# Patient Record
Sex: Male | Born: 1975 | Race: White | Hispanic: No | Marital: Married | Smoking: Former smoker
Health system: Southern US, Community
[De-identification: ages and names within clinical notes are randomized; demographics above are authoritative.]

## PROBLEM LIST (undated history)

## (undated) DIAGNOSIS — K219 Gastro-esophageal reflux disease without esophagitis: Secondary | ICD-10-CM

## (undated) DIAGNOSIS — Z8711 Personal history of peptic ulcer disease: Secondary | ICD-10-CM

## (undated) DIAGNOSIS — Z8719 Personal history of other diseases of the digestive system: Secondary | ICD-10-CM

## (undated) DIAGNOSIS — I10 Essential (primary) hypertension: Secondary | ICD-10-CM

## (undated) HISTORY — PX: APPENDECTOMY: SHX54

## (undated) HISTORY — DX: Personal history of peptic ulcer disease: Z87.11

## (undated) HISTORY — DX: Personal history of other diseases of the digestive system: Z87.19

## (undated) SURGERY — REMOVAL, HARDWARE
Anesthesia: Choice | Laterality: Right

---

## 2001-11-28 ENCOUNTER — Emergency Department (HOSPITAL_COMMUNITY): Admission: EM | Admit: 2001-11-28 | Discharge: 2001-11-28 | Payer: Self-pay | Admitting: *Deleted

## 2001-12-03 ENCOUNTER — Emergency Department (HOSPITAL_COMMUNITY): Admission: EM | Admit: 2001-12-03 | Discharge: 2001-12-03 | Payer: Self-pay | Admitting: Emergency Medicine

## 2012-01-21 ENCOUNTER — Emergency Department (HOSPITAL_COMMUNITY)
Admission: EM | Admit: 2012-01-21 | Discharge: 2012-01-21 | Disposition: A | Discharge status: Home / Self Care | Payer: Worker's Compensation | Attending: Emergency Medicine | Admitting: Emergency Medicine

## 2012-01-21 ENCOUNTER — Emergency Department (HOSPITAL_COMMUNITY): Payer: Worker's Compensation

## 2012-01-21 ENCOUNTER — Encounter (HOSPITAL_COMMUNITY): Payer: Self-pay | Admitting: *Deleted

## 2012-01-21 DIAGNOSIS — S335XXA Sprain of ligaments of lumbar spine, initial encounter: Secondary | ICD-10-CM | POA: Insufficient documentation

## 2012-01-21 DIAGNOSIS — M545 Low back pain, unspecified: Secondary | ICD-10-CM | POA: Insufficient documentation

## 2012-01-21 DIAGNOSIS — Y990 Civilian activity done for income or pay: Secondary | ICD-10-CM | POA: Insufficient documentation

## 2012-01-21 DIAGNOSIS — S39012A Strain of muscle, fascia and tendon of lower back, initial encounter: Secondary | ICD-10-CM

## 2012-01-21 DIAGNOSIS — Y9289 Other specified places as the place of occurrence of the external cause: Secondary | ICD-10-CM | POA: Insufficient documentation

## 2012-01-21 DIAGNOSIS — F17200 Nicotine dependence, unspecified, uncomplicated: Secondary | ICD-10-CM | POA: Insufficient documentation

## 2012-01-21 DIAGNOSIS — X500XXA Overexertion from strenuous movement or load, initial encounter: Secondary | ICD-10-CM | POA: Insufficient documentation

## 2012-01-21 MED ORDER — HYDROCODONE-ACETAMINOPHEN 5-325 MG PO TABS
1.0000 | ORAL_TABLET | Freq: Once | ORAL | Status: AC
  Administered 2012-01-21: 1 via ORAL
  Filled 2012-01-21: qty 1

## 2012-01-21 MED ORDER — NAPROXEN 500 MG PO TABS: 500.0000 mg | ORAL_TABLET | Freq: Two times a day (BID) | ORAL | Status: AC

## 2012-01-21 MED ORDER — CYCLOBENZAPRINE HCL 10 MG PO TABS: 10.0000 mg | ORAL_TABLET | Freq: Two times a day (BID) | ORAL | Status: AC | PRN

## 2012-01-21 MED ORDER — HYDROCODONE-ACETAMINOPHEN 5-325 MG PO TABS: 1.0000 | ORAL_TABLET | Freq: Four times a day (QID) | ORAL | Status: AC | PRN

## 2012-01-21 NOTE — ED Notes (Signed)
Patient with no complaints at this time. Respirations even and unlabored. Skin warm/dry. Discharge instructions reviewed with patient at this time. Patient given opportunity to voice concerns/ask questions. Patient discharged at this time and left Emergency Department with steady gait.   

## 2012-01-21 NOTE — ED Notes (Signed)
Patient transported to X-ray 

## 2012-01-21 NOTE — ED Provider Notes (Signed)
History     CSN: [id number]  Arrival date & time 01/21/12  1604   First MD Initiated Contact with Patient 01/21/12 1617      Chief Complaint  Patient presents with  . Back Pain    (Consider location/radiation/quality/duration/timing/severity/associated sxs/prior treatment) The history is provided by the patient.   patient is a 36 year old male with injury to his low part of his back approximately one week ago while lifting your a pop since that time he said pain on left side of the back it is nonradiating does not move into the left lower extremity she moves up the back some no midline tenderness. No numbness or weakness in the lower trimming is no incontinence. No past history of any back problems. Currently does not have a primary care Dr.  History reviewed. No pertinent past medical history.  Past Surgical History  Procedure Date  . Appendectomy     Family History  Problem Relation Age of Onset  . Diabetes Mother   . Heart failure Mother     History  Substance Use Topics  . Smoking status: Current Everyday Smoker  . Smokeless tobacco: Not on file  . Alcohol Use: No      Review of Systems  Constitutional: Negative for fever and chills.  HENT: Negative for neck pain.   Eyes: Negative for visual disturbance.  Respiratory: Negative for shortness of breath.   Cardiovascular: Negative for chest pain.  Gastrointestinal: Negative for nausea, vomiting and abdominal pain.  Genitourinary: Negative for dysuria.  Musculoskeletal: Positive for back pain.  Skin: Negative for rash.  Neurological: Negative for weakness, numbness and headaches.  Hematological: Does not bruise/bleed easily.    Allergies  Review of patient's allergies indicates no known allergies.  Home Medications   Current Outpatient Rx  Name Route Sig Dispense Refill  . ASPIRIN-ACETAMINOPHEN-CAFFEINE 250-250-65 MG PO TABS Oral Take 2 tablets by mouth as needed. FOR PAIN    . CYCLOBENZAPRINE HCL 10 MG  PO TABS Oral Take 1 tablet (10 mg total) by mouth 2 (two) times daily as needed for muscle spasms. 20 tablet 0  . HYDROCODONE-ACETAMINOPHEN 5-325 MG PO TABS Oral Take 1-2 tablets by mouth every 6 (six) hours as needed for pain. 14 tablet 0  . NAPROXEN 500 MG PO TABS Oral Take 1 tablet (500 mg total) by mouth 2 (two) times daily. 14 tablet 0    BP 132/89  Pulse 84  Temp(Src) 97.9 F (36.6 C) (Oral)  Resp 18  Ht 5\' 11"  (1.803 m)  Wt 225 lb (102.059 kg)  BMI 31.38 kg/m2  SpO2 100%  Physical Exam  Nursing note and vitals reviewed. Constitutional: He is oriented to person, place, and time. He appears well-developed and well-nourished. No distress.  HENT:  Head: Normocephalic and atraumatic.  Mouth/Throat: Oropharynx is clear and moist.  Eyes: Conjunctivae and EOM are normal. Pupils are equal, round, and reactive to light.  Neck: Normal range of motion. Neck supple.  Cardiovascular: Normal rate, regular rhythm and normal heart sounds.   Pulmonary/Chest: Effort normal and breath sounds normal.  Abdominal: Soft. Bowel sounds are normal. There is no tenderness.  Musculoskeletal: Normal range of motion. He exhibits tenderness. He exhibits no edema.       Nontender along the lumbar spine however there is left paraspinous muscle tenderness. Neurocirculatory is intact to the lower extremities.  Neurological: He is alert and oriented to person, place, and time. No cranial nerve deficit. Coordination normal.  Skin: Skin is warm. No  rash noted. No erythema.    ED Course  Procedures (including critical care time)  Labs Reviewed - No data to display Dg Lumbar Spine Complete  01/21/2012  *RADIOLOGY REPORT*  Clinical Data: Back pain for 1 week  LUMBAR SPINE - COMPLETE 4+ VIEW  Comparison: None.  Findings:  There are five non-rib bearing lumbar vertebral bodies with possible lumbarization of the S1 vertebral body.  For the purpose of this dictation, lumbar spine levels will be labeled L1-L5.  There  is mild scoliotic curvature of the visualized thoracolumbar spine.  No anterolisthesis or retrolisthesis.  No pars defects.  There is mild (<25%) age indeterminate anterior compression deformity of the L1 and L2 vertebral bodies.  Mild DDD at L3 - L4. Intervertebral disc spaces are otherwise well preserved. Limited visualization of the bilateral SI joints is normal.  Regional bowel gas pattern is normal.  IMPRESSION: 1.  Mild (<25%) age indeterminate anterior compression deformity of the L1 and L2 vertebral bodies. Correlation for point tenderness at these locations recommended. 2.  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  Original Report Authenticated By: Waynard Reeds, M.D.     1. Lumbar strain       MDM   X-ray findings of the lumbar area noted on examination there is no point tenderness to the lumbar spine is bodies. Most of the discomfort is to the left in the paraspinous muscle area. However with the lifting at work there was a popping so there could be a correlation to the compression deformities. How will treat patient with also relaxers pain medicine anti-inflammatories and referral to spine clinic. Patient without any neuro deficits or evidence of nerve compression at this point in time.        Shelda Jakes, MD 01/21/12 [phone]

## 2012-01-21 NOTE — ED Notes (Signed)
Hurt low back last week at work when lifting , heard a pop

## 2012-01-21 NOTE — Discharge Instructions (Signed)
Back Pain, Adult Low back pain is very common. About 1 in 5 people have back pain.The cause of low back pain is rarely dangerous. The pain often gets better over time.About half of people with a sudden onset of back pain feel better in just 2 weeks. About 8 in 10 people feel better by 6 weeks.  CAUSES Some common causes of back pain include:  Strain of the muscles or ligaments supporting the spine.   Wear and tear (degeneration) of the spinal discs.   Arthritis.   Direct injury to the back.  DIAGNOSIS Most of the time, the direct cause of low back pain is not known.However, back pain can be treated effectively even when the exact cause of the pain is unknown.Answering your caregiver's questions about your overall health and symptoms is one of the most accurate ways to make sure the cause of your pain is not dangerous. If your caregiver needs more information, he or she [place] order lab work or imaging tests (X-rays or MRIs).However, even if imaging tests show changes in your back, this usually does not require surgery. HOME CARE INSTRUCTIONS For many people, back pain returns.Since low back pain is rarely dangerous, it is often a condition that people can learn to manageon their own.   Remain active. It is stressful on the back to sit or stand in one place. Do not sit, drive, or stand in one place for more than 30 minutes at a time. Take short walks on level surfaces as soon as pain allows.Try to increase the length of time you walk each day.   Do not stay in bed.Resting more than 1 or 2 days can delay your recovery.   Do not avoid exercise or work.Your body is made to move.It is not dangerous to be active, even though your back [place] hurt.Your back will likely heal faster if you return to being active before your pain is gone.   Pay attention to your body when you bend and lift. Many people have less discomfortwhen lifting if they bend their knees, keep the load close to their  bodies,and avoid twisting. Often, the most comfortable positions are those that put less stress on your recovering back.   Find a comfortable position to sleep. Use a firm mattress and lie on your side with your knees slightly bent. If you lie on your back, put a pillow under your knees.   Only take over-the-counter or prescription medicines as directed by your caregiver. Over-the-counter medicines to reduce pain and inflammation are often the most helpful.Your caregiver Sanker prescribe muscle relaxant drugs.These medicines help dull your pain so you can more quickly return to your normal activities and healthy exercise.   Put ice on the injured area.   Put ice in a plastic bag.   Place a towel between your skin and the bag.   Leave the ice on for 15 to 20 minutes, 3 to 4 times a day for the first 2 to 3 days. After that, ice and heat Mcmeen be alternated to reduce pain and spasms.   Ask your caregiver about trying back exercises and gentle massage. This Pull[place] be of some benefit.   Avoid feeling anxious or stressed.Stress increases muscle tension and can worsen back pain.It is important to recognize when you are anxious or stressed and learn ways to manage it.Exercise is a great option.  SEEK MEDICAL CARE IF:  You have pain that is not relieved with rest or medicine.   You have   pain that does not improve in 1 week.   You have new symptoms.   You are generally not feeling well.  SEEK IMMEDIATE MEDICAL CARE IF:   You have pain that radiates from your back into your legs.   You develop new bowel or bladder control problems.   You have unusual weakness or numbness in your arms or legs.   You develop nausea or vomiting.   You develop abdominal pain.   You feel faint.  Document Released: 08/12/2005 Document Revised: 08/01/2011 Document Reviewed: 12/31/2010 [facility] Patient Information 2012 [place], [place].  Call spine clinic for followup. X-rays do show some compression fracture in  the lower lumbar and some degenerative changes that Sw[place] be contributing to the back pain. These would be old findings. They do not appear to be acute. Take medicine as directed return for new or worse symptoms. Also resource guide provided below to help you find a primary care Dr.  Sheila Oats GUIDE  Dental Problems  Patients with Medicaid: [facility] [phone]                                           [phone] [address] Phone:  [phone]                                                  Phone:  [phone]  If unable to pay or uninsured, contact:  [facility] or [facility]. to become qualified for the adult dental clinic.  Chronic Pain Problems Contact [facility]  [phone] Patients need to be referred by their primary care doctor.  Insufficient Money for Medicine Contact [employer] Way:  call "211" or [facility] 231-233-8781.  No Primary Care Doctor Call [facility]  501-726-7452 Other agencies that provide inexpensive medical care    [facility]  [phone]    [facility]  [phone]    [facility]  [phone]    [facility]  [phone]    [facility]  [phone]    [facility]  [phone]  Psychological Services [facility]  [phone] [facility]  [phone] [facility]   [phone] (emergency services [phone])  Substance Abuse Resources [facility]  (226)524-4713 [facility] 628-284-7992 [facility] (415)[phone] Floydene Flock [phone] Residential & Outpatient Substance Abuse Program  ([phone] [phone]  Abuse/Neglect [facility] Child Abuse Hotline (352)085-4544 [facility] Child Abuse Hotline 978-489-3619 (After Hours)  Emergency Shelter [facility] 503-171-9895  Maternity [facility] [phone] [facility] Services [phone]  MRSA Hotline #:   4062531204    [facility] Resources  [facility]     [employer]                          [facility] Dept. [address] [place] Phone:  [phone]                                   Phone:  [phone]                 Phone:  [place] Phone:  [place] [phone] (After Hours)

## 2012-05-30 ENCOUNTER — Encounter (HOSPITAL_COMMUNITY): Payer: Self-pay

## 2012-05-30 ENCOUNTER — Emergency Department (HOSPITAL_COMMUNITY)
Admission: EM | Admit: 2012-05-30 | Discharge: 2012-05-31 | Disposition: A | Discharge status: Home / Self Care | Payer: Self-pay | Attending: Emergency Medicine | Admitting: Emergency Medicine

## 2012-05-30 DIAGNOSIS — T63391A Toxic effect of venom of other spider, accidental (unintentional), initial encounter: Secondary | ICD-10-CM | POA: Insufficient documentation

## 2012-05-30 DIAGNOSIS — T6391XA Toxic effect of contact with unspecified venomous animal, accidental (unintentional), initial encounter: Secondary | ICD-10-CM | POA: Insufficient documentation

## 2012-05-30 DIAGNOSIS — F17200 Nicotine dependence, unspecified, uncomplicated: Secondary | ICD-10-CM | POA: Insufficient documentation

## 2012-05-30 DIAGNOSIS — T63311A Toxic effect of venom of black widow spider, accidental (unintentional), initial encounter: Secondary | ICD-10-CM

## 2012-05-30 MED ORDER — HYDROMORPHONE HCL PF 1 MG/ML IJ SOLN
1.0000 mg | Freq: Once | INTRAMUSCULAR | Status: AC
  Administered 2012-05-31: 1 mg via INTRAVENOUS
  Filled 2012-05-30: qty 1

## 2012-05-30 MED ORDER — ONDANSETRON HCL 4 MG/2ML IJ SOLN
4.0000 mg | Freq: Once | INTRAMUSCULAR | Status: AC
  Administered 2012-05-31: 4 mg via INTRAVENOUS
  Filled 2012-05-30: qty 2

## 2012-05-30 MED ORDER — KETOROLAC TROMETHAMINE 30 MG/ML IJ SOLN
30.0000 mg | Freq: Once | INTRAMUSCULAR | Status: AC
  Administered 2012-05-31: 30 mg via INTRAVENOUS
  Filled 2012-05-30: qty 1

## 2012-05-30 NOTE — ED Provider Notes (Signed)
History   This chart was scribed for Vida Roller, MD scribed by Magnus Sinning. The patient was seen in room APA12/APA12 at 23:48   CSN: [id number]  Arrival date & time 05/30/12  2321    Chief Complaint  Patient presents with  . Insect Bite  . black widow bite     (Consider location/radiation/quality/duration/timing/severity/associated sxs/prior treatment) HPI Victor [name] is a 36 y.o. male who presents to the Emergency Department for EVAL of an insect bite at left chest with associated localized pain, nausea and left arm numbness, and redness onset this evening. Patient was bitten by a black widow Spider that he states was inside his T-shirt on his left upper chest when he was bitten.  The pain was acute in onset, persistent, worse with palpation of the chest and associated with mild nausea. He denies any blurred vision or weakness. The symptoms are persistent. He has had no medication prior to arrival. He does bring with him the spider that bit him and I have confirmed that this is a black widow spider on my exam.  History reviewed. No pertinent past medical history.  Past Surgical History  Procedure Date  . Appendectomy     Family History  Problem Relation Age of Onset  . Diabetes Mother   . Heart failure Mother     History  Substance Use Topics  . Smoking status: Current Every Day Smoker  . Smokeless tobacco: Not on file  . Alcohol Use: No      Review of Systems  Constitutional: Negative for fever.  Cardiovascular: Positive for chest pain.  Gastrointestinal: Positive for nausea.  Musculoskeletal: Negative for myalgias and back pain.  Skin: Positive for rash.  Neurological: Positive for light-headedness. Negative for headaches.    Allergies  Review of patient's allergies indicates no known allergies.  Home Medications   Current Outpatient Rx  Name Route Sig Dispense Refill  . ASPIRIN-ACETAMINOPHEN-CAFFEINE 250-250-65 MG PO TABS Oral Take 2 tablets by mouth  as needed. FOR PAIN    . CEPHALEXIN 500 MG PO CAPS Oral Take 1 capsule (500 mg total) by mouth 4 (four) times daily. 40 capsule 0  . NAPROXEN 500 MG PO TABS Oral Take 1 tablet (500 mg total) by mouth 2 (two) times daily. 14 tablet 0  . NAPROXEN 500 MG PO TABS Oral Take 1 tablet (500 mg total) by mouth 2 (two) times daily with a meal. 30 tablet 0  . OXYCODONE-ACETAMINOPHEN 5-325 MG PO TABS Oral Take 1 tablet by mouth every 4 (four) hours as needed for pain. 20 tablet 0    BP 127/83  Pulse 81  Resp 17  SpO2 99%  Physical Exam  Nursing note and vitals reviewed. Constitutional: He appears well-developed and well-nourished. No distress.  HENT:  Head: Normocephalic and atraumatic.  Mouth/Throat: Oropharynx is clear and moist.  Eyes: Conjunctivae normal and EOM are normal.  Neck: Normal range of motion. Neck supple. No tracheal deviation present.  Cardiovascular: Normal rate and regular rhythm.   Pulmonary/Chest: Effort normal. No respiratory distress. He exhibits tenderness ( tenderness to palpation over the mild erythema of the left upper chest wall at the site of the insect bite).  Abdominal: He exhibits no distension.  Musculoskeletal: Normal range of motion.  Neurological: He is alert.       Speech is normal, gait is normal, moves all extremities without difficulty or defecit  Skin: Skin is dry. There is erythema.       8 cm  area of patchy, lacy erythema to left upper chest wall, no subcutaneous emphysema, no increased warmth  Psychiatric: He has a normal mood and affect. His behavior is normal.    ED Course  Procedures (including critical care time) DIAGNOSTIC STUDIES: Oxygen Saturation is 99% on room air, normal by my interpretation.    COORDINATION OF CARE:   Labs Reviewed - No data to display No results found.   1. Black widow spider bite       MDM  The patient has sustained an insect bite to his chest wall consistent with a venomous spider. He has had only local  symptoms, blood pressure was initially elevated at 152/88, hydromorphone and ketorolac intravenously were given as well as Zofran with almost complete resolution of symptoms and improvement of blood pressure to 120/80. He is not febrile, not tachycardic and appears stable for discharge. I have described to him the indications for return including worsening of the swelling or redness on his chest wall or fevers. He has been given a prescription for Keflex should he develop symptoms consistent with infection. He has expressed his understanding. He has a ride who can drive him home  The pt was given opiate type medications while in the emergency dept.  The patient was instructed on the possible side effects and potential allergic reactions associated with said medications and agreed to their use.  I also instructed the patient not to perform certain activities after use of these medications such as driving a vehicle and performing child care.  They were instructed not to ingest alcohol or other medications that Dillen cause excessive sleepiness, tranquilizers or CNS depressant medications.  They have expressed their understanding.  If the pt was given opiate medications for home by prescription they were reminded of these precautions as well.  I personally performed the services described in this documentation, which was scribed in my presence. The recorded information has been reviewed and considered.          Vida Roller, MD 05/31/12 [phone]

## 2012-05-30 NOTE — ED Notes (Signed)
Pt states he thinks he was bitten by a black widow, has bite mark to left anterior chest, is having body aches and pain into left arm.  Redness to over pectoral muscle.

## 2012-05-31 MED ORDER — OXYCODONE-ACETAMINOPHEN 5-325 MG PO TABS: 1.0000 | ORAL_TABLET | ORAL | Status: DC | PRN

## 2012-05-31 MED ORDER — NAPROXEN 500 MG PO TABS: 500.0000 mg | ORAL_TABLET | Freq: Two times a day (BID) | ORAL | Status: DC

## 2012-05-31 MED ORDER — CEPHALEXIN 500 MG PO CAPS: 500.0000 mg | ORAL_CAPSULE | Freq: Four times a day (QID) | ORAL | Status: DC

## 2014-03-02 ENCOUNTER — Emergency Department (HOSPITAL_BASED_OUTPATIENT_CLINIC_OR_DEPARTMENT_OTHER)
Admission: EM | Admit: 2014-03-02 | Discharge: 2014-03-02 | Disposition: A | Discharge status: Home / Self Care | Payer: Self-pay | Attending: Emergency Medicine | Admitting: Emergency Medicine

## 2014-03-02 ENCOUNTER — Emergency Department (HOSPITAL_BASED_OUTPATIENT_CLINIC_OR_DEPARTMENT_OTHER): Payer: Self-pay

## 2014-03-02 ENCOUNTER — Encounter (HOSPITAL_BASED_OUTPATIENT_CLINIC_OR_DEPARTMENT_OTHER): Payer: Self-pay | Admitting: Emergency Medicine

## 2014-03-02 DIAGNOSIS — F17200 Nicotine dependence, unspecified, uncomplicated: Secondary | ICD-10-CM | POA: Insufficient documentation

## 2014-03-02 DIAGNOSIS — Y9389 Activity, other specified: Secondary | ICD-10-CM | POA: Insufficient documentation

## 2014-03-02 DIAGNOSIS — S8990XA Unspecified injury of unspecified lower leg, initial encounter: Secondary | ICD-10-CM | POA: Insufficient documentation

## 2014-03-02 DIAGNOSIS — S99919A Unspecified injury of unspecified ankle, initial encounter: Principal | ICD-10-CM

## 2014-03-02 DIAGNOSIS — S8991XA Unspecified injury of right lower leg, initial encounter: Secondary | ICD-10-CM

## 2014-03-02 DIAGNOSIS — S99929A Unspecified injury of unspecified foot, initial encounter: Principal | ICD-10-CM

## 2014-03-02 DIAGNOSIS — Z791 Long term (current) use of non-steroidal anti-inflammatories (NSAID): Secondary | ICD-10-CM | POA: Insufficient documentation

## 2014-03-02 DIAGNOSIS — Z792 Long term (current) use of antibiotics: Secondary | ICD-10-CM | POA: Insufficient documentation

## 2014-03-02 DIAGNOSIS — Y9289 Other specified places as the place of occurrence of the external cause: Secondary | ICD-10-CM | POA: Insufficient documentation

## 2014-03-02 DIAGNOSIS — X500XXA Overexertion from strenuous movement or load, initial encounter: Secondary | ICD-10-CM | POA: Insufficient documentation

## 2014-03-02 MED ORDER — IBUPROFEN 800 MG PO TABS: 800.0000 mg | ORAL_TABLET | Freq: Three times a day (TID) | ORAL | Status: DC | PRN

## 2014-03-02 MED ORDER — HYDROCODONE-ACETAMINOPHEN 5-325 MG PO TABS: 1.0000 | ORAL_TABLET | ORAL | Status: DC | PRN

## 2014-03-02 NOTE — Discharge Instructions (Signed)
Possible Anterior Cruciate Ligament Tear with Rehab The anterior cruciate ligament (ACL) of the knee is one of the four major ligaments of the knee. The ACL is responsible for preventing the shinbone (tibia) from moving to far forward (anteriorly) in relation to the thigh bone (femur). An ACL tear (sprain) is a common injury for athletes. The ACL is most important for sports in which pivoting, changing cutting (direction), or jumping and landing is necessary. In general, ligaments do not heal well, and these injuries often require surgery. Approximately 50% of people who tear their ACL also tear the cartilage of their meniscus at the same time. SYMPTOMS   "Pop" or tear heard or felt at the time of injury.  An inability to continue playing after the injury.  Swelling of the knee within 48 hours.  Inability to straighten knee.  Knee giving way or buckling, particularly when trying to pivot, cut (rapidly change direction) or jump.  Occasionally, locking when there is concurrent injury to the meniscus cartilage. CAUSES  An ACL tear occurs when the ligament is subjected to a greater force than it can withstand. ACL tears commonly occur from contact (being tackled at the knee) or non-contact (ie. landing awkwardly) events. RISK INCREASES WITH:  Sports that require pivoting, jumping, cutting, or changing direction (ie. basketball, soccer, or volleyball) or contact sports. (football, rugby or lacrosse).  Poor strength and flexibility.  Women tend to be at a higher risk than men.  Improperly fitted or padded equipment. PREVENTION   Warm up and stretch properly before activity.  Maintain physical fitness:  Thigh, leg, and knee flexibility.  Muscle strength and endurance.  Cardiovascular fitness.  Learn and use proper technique.  Wear properly fitted equipment (appropriate length of cleats for surface). PROGNOSIS  ACL tears do not heal on their own. However, most people will be able  to perform activities of daily living after completing a rehabilitation program. For individuals who desire to return to activities that require pivoting, cutting, or jumping and landing, surgery is usually required. RELATED COMPLICATIONS   Frequent recurrence of symptoms, such as knee giving way, instability, and swelling.  Meniscus injury, which Alphin cause locking and swelling of the knee.  Injury to other structures of the knee.  Arthritis of the knee.  Knee stiffness (loss of knee motion). TREATMENT Treatment initially involves ice and pain medication to help reduce pain and inflammation. It is often recommended that one walk with crutches until your knee will allow walking without a limp. Rehabilitation programs that involve strengthening and stretching exercises to regain strength and a full range of motion are necessary to regain proper functioning of the knee. These exercises Mcmeans be completed at home or with a therapist. Your caregiver Rather give you a knee brace to help support the instable joint. Rehabilitation programs also will educate you on how to avoid further injury to the joint. If you want to return to sports involving pivoting cutting, or jumping and landing, then surgery is necessary to replace (reconstruct) the torn ligament. MEDICATION  If pain medication is necessary, then nonsteroidal anti-inflammatory medications, such as aspirin and ibuprofen, or other minor pain relievers, such as acetaminophen, are often recommended.  Do not take pain medication within 7 days before surgery.  Prescription pain relievers Hypolite be given if deemed necessary by your caregiver. Use only as directed and only as much as you need. HEAT AND COLD  Cold treatment (icing) relieves pain and reduces inflammation. Cold treatment should be applied for 10  to 15 minutes every 2 to 3 hours for inflammation and pain and immediately after any activity that aggravates your symptoms. Use ice packs or an ice  massage.  Heat treatment Faro be used prior to performing the stretching and strengthening activities prescribed by your caregiver, physical therapist or athletic trainer. Use a heat pack or a warm soak. SEEK MEDICAL CARE IF:   Symptoms get worse or do not improve in 6 weeks despite treatment.  New, unexplained symptoms develop. (Drugs used in treatment Berrett produce side effects). EXERCISES  RANGE OF MOTION (ROM) AND STRETCHING EXERCISES - Anterior Cruciate Ligament Tear These exercises Pillars help you when beginning to rehabilitate your injury. Your symptoms Gombert resolve with or without further involvement from your physician, physical therapist or athletic trainer. While completing these exercises, remember:  Restoring tissue flexibility helps normal motion to return to the joints. This allows healthier, less painful movement and activity.  An effective stretch should be held for at least 30 seconds.  A stretch should never be painful. You should only feel a gentle lengthening or release in the stretched tissue. RANGE OF MOTION - Knee Flexion, Active  Lie on your back with both knees straight. (If this causes back discomfort, bend your opposite knee, placing your foot flat on the floor.)  Slowly slide your heel back toward your buttocks until you feel a gentle stretch in the front of your knee or thigh.  Hold for __________ seconds. Slowly slide your heel back to the starting position. Repeat __________ times. Complete this exercise __________ times per day. STRETCH - Knee Flexion, Supine  Lie on the floor with your right / left heel/foot lightly touching the wall (place both feet on the wall if you do not use a door frame).  Without using any effort, allow gravity to slide your foot down the wall slowly until you feel a gentle stretch in the front of your right / left knee.  Hold this stretch for __________ seconds. Then return the leg to the starting position, using your healthy leg for  help, if needed. Repeat __________ times. Complete this stretch __________ times per day. RANGE OF MOTION - Knee Flexion and Extension, Active-Assisted  Sit on the edge of a table or chair with your thighs firmly supported. It Kamp be helpful to place a folded towel under the end of your right / left thigh.  Flexion (bending): Place the ankle of your healthy leg on top of the other ankle. Use your healthy leg to gently bend your right / left knee until you feel a mild tension across the top of your knee.  Hold for __________ seconds.  Extension (straightening): Switch your ankles so your right / left leg is on top. Use your healthy leg to straighten your right / left knee until you feel a mild tension on the backside of your knee.  Hold for __________ seconds. Repeat __________ times. Complete this exercise __________ times per day. STRETCH - Knee Extension Sitting  Sit with your right / left leg/heel propped on another chair, coffee table or foot stool.  Allow your leg muscles to relax, letting gravity straighten out your knee.*  You should feel a stretch behind your right / left knee. Hold this position for __________ seconds. Repeat __________ times. Complete this stretch __________ times per day. *Your physician, physical therapist or athletic trainer Wedig instruct you to place a __________ weight on your thigh just above your kneecap to deepen the stretch. STRETCH - Knee Extension,  Prone  Lie on your stomach on a firm surface, such as a bed or countertop. Place your right / left knee and leg just beyond the edge of the surface. You Denk wish to place a towel under the far end of your thigh for comfort.  Relax your leg muscles and allow gravity to straighten your knee. Your clinician Philbrick advise you to add an ankle weight if more resistance is helpful for you.  You should feel a stretch in the back of your right / left knee. Hold this position for __________ seconds. Repeat __________  times. Complete this __________ times per day. *Your physician, physical therapist or athletic trainer Kubisiak instruct you to place a __________ weight on your ankle to deepen the stretch. STRENGTHENING EXERCISES - Anterior Cruciate Ligament Tear These exercises Wooton help you when beginning to rehabilitate your injury. They Fredericksen resolve your symptoms with or without further involvement from your physician, physical therapist or athletic trainer. While completing these exercises, remember:  Muscles can gain both the endurance and the strength needed for everyday activities through controlled exercises.  Complete these exercises as instructed by your physician, physical therapist or athletic trainer. Progress the resistance and repetitions only as guided.  You Sidman experience muscle soreness or fatigue, but the pain or discomfort you are trying to eliminate should never worsen during these exercises. If this pain does worsen, stop and make certain you are following the directions exactly. If the pain is still present after adjustments, discontinue the exercise until you can discuss the trouble with your clinician. STRENGTH - Quadriceps, Isometrics  Lie on your back with your right / left leg extended and your opposite knee bent.  Gradually tense the muscles in the front of your thigh. You should see either your knee cap slide up toward your hip or increased dimpling just above the knee. This motion will push the back of the knee down toward the floor/mat/bed on which you are lying.  Hold the muscle as tight as you can without increasing your pain for __________ seconds.  Relax the muscles slowly and completely in between each repetition. Repeat __________ times. Complete this exercise __________ times per day. STRENGTH - Hamstring, Isometrics  Lie on your back on a firm surface.  Bend your right / left knee approximately __________ degrees.  Dig your heel into the surface as if you are trying to  pull it toward your buttocks. Tighten the muscles in the back of your thighs to "dig" as hard as you can without increasing any pain.  Hold this position for __________ seconds.  Release the tension gradually and allow your muscle to completely relax for __________ seconds in between each exercise. Repeat __________ times. Complete this exercise __________ times per day. STRENGTH - Quadriceps, Straight Leg Raises Quality counts! Watch for signs that the quadriceps muscle is working to insure you are strengthening the correct muscles and not "cheating" by substituting with healthier muscles.  Lay on your back with your right / left leg extended and your opposite knee bent.  Tense the muscles in the front of your right / left thigh. You should see either your knee cap slide up or increased dimpling just above the knee. Your thigh Papp even quiver.  Tighten these muscles even more and raise your leg 4 to 6 inches off the floor. Hold for __________ seconds.  Keeping these muscles tense, lower your leg.  Relax the muscles slowly and completely in between each repetition. Repeat __________ times. Complete  this exercise __________ times per day. STRENGTH - Hip Extensors, Bridge  Lie on your back on a firm surface. Bend your knees and place your feet flat on the floor.  Tighten your buttocks muscles and lift your bottom off the floor until your trunk is level with your thighs. You should feel the muscles in your buttocks and back of your thighs working. If you do not feel these muscles, slide your feet 1 to 2 inches further away from your buttocks.  Hold this position for __________ seconds.  Slowly lower your hips to the starting position and allow your buttock muscles relax completely before beginning the next repetition.  If this exercise is too easy, you Kinnard cross your arms over your chest. Repeat __________ times. Complete this exercise __________ times per day. STRENGTH - Hip Abductors,  Straight Leg Raises Be aware of your form throughout the entire exercise so that you exercise the correct muscles. Sloppy form means that you are not strengthening the correct muscles.  Lie on your side so that your head, shoulders, knee and hip line up. You Nevers bend your lower knee to help maintain your balance. Your right / left leg should be on top.  Roll your hips slightly forward, so that your hips are stacked directly over each other and your right / left knee is facing forward.  Lift your top leg up 4 to 6 inches, leading with your heel. Be sure that your foot does not drift forward or that your knee does not roll toward the ceiling.  Hold this position for __________ seconds. You should feel the muscles in your outer hip lifting (you Farewell not notice this until your leg begins to tire).  Slowly lower your leg to the starting position. Allow the muscles to fully relax before beginning the next repetition. Repeat __________ times. Complete this exercise __________ times per day. Document Released: 03/13/2005 Document Revised: 11/04/2011 Document Reviewed: 11/24/2008 [facility] Patient Information 2015 [place], [place]. This information is not intended to replace advice given to you by your health care provider. Make sure you discuss any questions you have with your health care provider.     Possible Patellar Dislocation A patellar dislocation occurs when your kneecap (patella) slips out of its normal position in a groove in front of the lower end of your thighbone (femur). This groove is called the patellofemoral groove.  CAUSES The kneecap is normally positioned over the front of the knee joint at the base of the thighbone. A kneecap can be dislocated when:  The kneecap is out of place (patellar tracking disorder), and force is applied.  The foot is firmly planted pointing outward, and the knee bends with the thigh turned inward. This kind of injury is common during many sports  activities.  The inner edge of the kneecap is hit, pushing it toward the outer side of the leg. SIGNS AND SYMPTOMS  Severe pain.  A misshapen knee that looks like a bone is out of position.  A popping sensation, followed by a feeling that something is out of place.  Inability to bend or straighten the knee.  Knee swelling.  Cool, pale skin or numbness and tingling in or below the affected knee. DIAGNOSIS  Your health care provider will physically examine the injured area. An X-ray exam Rumery be done to make sure a bone fracture has not occurred. In some cases, your health care provider Kruger look inside your knee joint with an instrument much like a pencil-sized telescope (  arthroscope). This Naab be done to make sure you have no loose cartilage in your joint. Loose cartilage is not visible on an X-ray image. TREATMENT  In many instances, the patella can be guided back into position without much difficulty. It often goes back into position by straightening the leg. Often, nothing more Lufkin be needed other than a brief period of immobilization followed by the exercises your health care provider recommends. If patellar dislocation starts to become frequent after the first incident, surgery Sand be needed to prevent your patella from slipping out of place. HOME CARE INSTRUCTIONS   Only take over-the-counter or prescription medicines for pain, discomfort, or fever as directed by your health care provider.  Use a knee brace if directed to do so by your health care provider.  Use crutches as instructed.  Apply ice to the injured knee:  Put ice in a plastic bag.  Place a towel between your skin and the bag.  Leave the ice on for 20 minutes, 2-3 times a day.  Follow your health care provider's instructions for doing any recommended range-of-motion exercises or other exercises. SEEK IMMEDIATE MEDICAL CARE IF:  You have increased pain or swelling in the knee that is not relieved with  medicine.  You have increasing inflammation in the knee.  You have locking or catching of your knee. MAKE SURE YOU:  Understand these instructions.  Will watch your condition.  Will get help right away if you are not doing well or get worse. Document Released: 05/07/2001 Document Revised: 06/02/2013 Document Reviewed: 03/24/2013 [facility] Patient Information 2015 [place], [place]. This information is not intended to replace advice given to you by your health care provider. Make sure you discuss any questions you have with your health care provider.   RICE: Routine Care for Injuries The routine care of many injuries includes Rest, Ice, Compression, and Elevation (RICE). HOME CARE INSTRUCTIONS  Rest is needed to allow your body to heal. Routine activities can usually be resumed when comfortable. Injured tendons and bones can take up to 6 weeks to heal. Tendons are the cord-like structures that attach muscle to bone.  Ice following an injury helps keep the swelling down and reduces pain.  Put ice in a plastic bag.  Place a towel between your skin and the bag.  Leave the ice on for 15-20 minutes, 3-4 times a day, or as directed by your health care provider. Do this while awake, for the first 24 to 48 hours. After that, continue as directed by your caregiver.  Compression helps keep swelling down. It also gives support and helps with discomfort. If an elastic bandage has been applied, it should be removed and reapplied every 3 to 4 hours. It should not be applied tightly, but firmly enough to keep swelling down. Watch fingers or toes for swelling, bluish discoloration, coldness, numbness, or excessive pain. If any of these problems occur, remove the bandage and reapply loosely. Contact your caregiver if these problems continue.  Elevation helps reduce swelling and decreases pain. With extremities, such as the arms, hands, legs, and feet, the injured area should be placed near or above the  level of the heart, if possible. SEEK IMMEDIATE MEDICAL CARE IF:  You have persistent pain and swelling.  You develop redness, numbness, or unexpected weakness.  Your symptoms are getting worse rather than improving after several days. These symptoms Kerby indicate that further evaluation or further X-rays are needed. Sometimes, X-rays Rotz not show a small broken bone (fracture)  until 1 week or 10 days later. Make a follow-up appointment with your caregiver. Ask when your X-ray results will be ready. Make sure you get your X-ray results. Document Released: 11/24/2000 Document Revised: 08/17/2013 Document Reviewed: 01/11/2011 [facility] Patient Information 2015 [place], [place]. This information is not intended to replace advice given to you by your health care provider. Make sure you discuss any questions you have with your health care provider.

## 2014-03-02 NOTE — ED Provider Notes (Signed)
TIME SEEN: 11:30 AM  CHIEF COMPLAINT: Right knee pain  HPI: Patient is a 37 y.o. M with prior history of anterior cruciate ligament injury who presents emergency department with right knee pain. He states that he was playing ball with his side when he twisted his knee and felt a pop and immediately swelled. He states since that time he has had pain in his right knee it is worse with walking, twisting and attempting to straighten his leg. He states it will intermittently swell and he feels that it is "popping out". He feels that his patella does move but is unable to tell me if it is dislocating. No numbness, tingling or focal weakness. No other injury. No back pain. No erythema or warmth. No fever. He has never had knee surgery.  ROS: See HPI Constitutional: no fever  Eyes: no drainage  ENT: no runny nose   Cardiovascular:  no chest pain  Resp: no SOB  GI: no vomiting GU: no dysuria Integumentary: no rash  Allergy: no hives  Musculoskeletal: no leg swelling  Neurological: no slurred speech ROS otherwise negative  PAST MEDICAL HISTORY/PAST SURGICAL HISTORY:  History reviewed. No pertinent past medical history.  MEDICATIONS:  Prior to Admission medications   Medication Sig Start Date End Date Taking? Authorizing Provider  aspirin-acetaminophen-caffeine (EXCEDRIN EXTRA STRENGTH) [id number] MG per tablet Take 2 tablets by mouth as needed. FOR PAIN    Historical Provider, MD  cephALEXin (KEFLEX) 500 MG capsule Take 1 capsule (500 mg total) by mouth 4 (four) times daily. 05/31/12   Johnna Acosta, MD  naproxen (NAPROSYN) 500 MG tablet Take 1 tablet (500 mg total) by mouth 2 (two) times daily with a meal. 05/31/12   Johnna Acosta, MD  oxyCODONE-acetaminophen (PERCOCET) 5-325 MG per tablet Take 1 tablet by mouth every 4 (four) hours as needed for pain. 05/31/12   Johnna Acosta, MD    ALLERGIES:  No Known Allergies  SOCIAL HISTORY:  History  Substance Use Topics  . Smoking status: Current  Every Day Smoker -- 0.50 packs/day    Types: Cigarettes  . Smokeless tobacco: Not on file  . Alcohol Use: Yes    FAMILY HISTORY: Family History  Problem Relation Age of Onset  . Diabetes Mother   . Heart failure Mother     EXAM: BP 121/79  Pulse 68  Temp(Src) 98.1 F (36.7 C) (Oral)  Resp 16  Ht 5\' 11"  (1.803 m)  Wt 220 lb (99.791 kg)  BMI 30.70 kg/m2  SpO2 97% CONSTITUTIONAL: Alert and oriented and responds appropriately to questions. Well-appearing; well-nourished HEAD: Normocephalic EYES: Conjunctivae clear, PERRL ENT: normal nose; no rhinorrhea; moist mucous membranes; pharynx without lesions noted NECK: Supple, no meningismus, no LAD  CARD: RRR; S1 and S2 appreciated; no murmurs, no clicks, no rubs, no gallops RESP: Normal chest excursion without splinting or tachypnea; breath sounds clear and equal bilaterally; no wheezes, no rhonchi, no rales,  ABD/GI: Normal bowel sounds; non-distended; soft, non-tender, no rebound, no guarding BACK:  The back appears normal and is non-tender to palpation, there is no CVA tenderness EXT: Patient has small joint effusion of the right knee with no erythema or warmth, no induration; 2+ DP pulses bilaterally, sensation to light touch intact diffusely, patient is tender to palpation over the medial joint line, he does have some mild ligamentous laxity when testing his anterior cruciate ligament with  Lachman's when compared to the left; no other ligamentous laxity, he is able to fully flex  and extend his knee on the right, patella is in appropriate position, no calf tenderness or swelling, Normal ROM in all joints; otherwise extremities are non-tender to palpation; no edema; normal capillary refill; no cyanosis; no pain over the right hip or right ankle    SKIN: Normal color for age and race; warm NEURO: Moves all extremities equally PSYCH: The patient's mood and manner are appropriate. Grooming and personal hygiene are appropriate.  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 He does have some ligamentous laxity when testing the anterior cruciate ligament on the right side when compared to the left but this [place]kington be secondary to her prior anterior cruciate ligament injury. We'll obtain x-ray but doubt any acute bony pathology. Patient denies wanting pain medication at this time. No signs of septic arthritis on exam. He is neurovascularly intact distally. Have discussed with family that I recommend knee immobilizer, ice, elevation, rest and outpatient orthopedic followup. They agree with this plan. They state they're concerned however because they do not have insurance and are worried about followup. Have discussed with him that I will give them multiple orthopedic physicians who they Null contact to see if they can set up payment plans.  ED PROGRESS: X-ray show a moderate joint effusion. No bony injury. We'll discharge home in the immobilizer with crutches. We'll discharge with prescription for ibuprofen, Vicodin. Have discussed with patient rest, elevation, ice. Have discussed return precautions. We'll give orthopedic followup information. Patient and family verbalize understanding and are comfortable with plan.     [place], DO 03/02/14 1210

## 2014-03-02 NOTE — ED Notes (Signed)
Pt c/o right knee pain with h/o same. No known injury. Pt sts "it keeps popping out".

## 2015-04-05 ENCOUNTER — Encounter (HOSPITAL_COMMUNITY): Payer: Self-pay

## 2015-04-05 ENCOUNTER — Emergency Department (HOSPITAL_COMMUNITY)
Admission: EM | Admit: 2015-04-05 | Discharge: 2015-04-05 | Disposition: A | Discharge status: Home / Self Care | Payer: Self-pay | Attending: Emergency Medicine | Admitting: Emergency Medicine

## 2015-04-05 DIAGNOSIS — K0889 Other specified disorders of teeth and supporting structures: Secondary | ICD-10-CM

## 2015-04-05 DIAGNOSIS — K029 Dental caries, unspecified: Secondary | ICD-10-CM | POA: Insufficient documentation

## 2015-04-05 DIAGNOSIS — Z792 Long term (current) use of antibiotics: Secondary | ICD-10-CM | POA: Insufficient documentation

## 2015-04-05 DIAGNOSIS — R51 Headache: Secondary | ICD-10-CM | POA: Insufficient documentation

## 2015-04-05 DIAGNOSIS — K088 Other specified disorders of teeth and supporting structures: Secondary | ICD-10-CM | POA: Insufficient documentation

## 2015-04-05 DIAGNOSIS — Z720 Tobacco use: Secondary | ICD-10-CM | POA: Insufficient documentation

## 2015-04-05 DIAGNOSIS — Z791 Long term (current) use of non-steroidal anti-inflammatories (NSAID): Secondary | ICD-10-CM | POA: Insufficient documentation

## 2015-04-05 MED ORDER — KETOROLAC TROMETHAMINE 10 MG PO TABS
10.0000 mg | ORAL_TABLET | Freq: Once | ORAL | Status: AC
  Administered 2015-04-05: 10 mg via ORAL
  Filled 2015-04-05: qty 1

## 2015-04-05 MED ORDER — IBUPROFEN 800 MG PO TABS
800.0000 mg | ORAL_TABLET | Freq: Three times a day (TID) | ORAL | Status: DC
Start: 2015-04-05 — End: 2017-04-29

## 2015-04-05 MED ORDER — CLINDAMYCIN HCL 150 MG PO CAPS
300.0000 mg | ORAL_CAPSULE | Freq: Once | ORAL | Status: AC
  Administered 2015-04-05: 300 mg via ORAL
  Filled 2015-04-05: qty 2

## 2015-04-05 MED ORDER — ACETAMINOPHEN 325 MG PO TABS
650.0000 mg | ORAL_TABLET | Freq: Once | ORAL | Status: AC
  Administered 2015-04-05: 650 mg via ORAL
  Filled 2015-04-05: qty 2

## 2015-04-05 MED ORDER — CLINDAMYCIN HCL 150 MG PO CAPS: ORAL_CAPSULE | ORAL | Status: DC

## 2015-04-05 NOTE — ED Notes (Signed)
Pt c/o pain in r jaw x 1 month but worse this past week.

## 2015-04-05 NOTE — Discharge Instructions (Signed)
Dental Pain  Toothache is pain in or around a tooth. It M[place] get worse with chewing or with cold or heat.   HOME CARE  · Your dentist Faires use a numbing medicine during treatment. If so, you Valdez need to avoid eating until the medicine wears off. Ask your dentist about this.  · Only take medicine as told by your dentist or doctor.  · Avoid chewing food near the painful tooth until after all treatment is done. Ask your dentist about this.  GET HELP RIGHT AWAY IF:   · The problem gets worse or new problems appear.  · You have a fever.  · There is redness and puffiness (swelling) of the face, jaw, or neck.  · You cannot open your mouth.  · There is pain in the jaw.  · There is very bad pain that is not helped by medicine.  MAKE SURE YOU:   · Understand these instructions.  · Will watch your condition.  · Will get help right away if you are not doing well or get worse.  Document Released: 01/29/2008 Document Revised: 11/04/2011 Document Reviewed: 01/29/2008  ExitCare® Patient Information ©2015 [employer]. This information is not intended to replace advice given to you by your health care provider. Make sure you discuss any questions you have with your health care provider.

## 2015-04-05 NOTE — ED Provider Notes (Signed)
CSN: [id number]     Arrival date & time 04/05/15  1551 History   First MD Initiated Contact with Patient 04/05/15 1556     Chief Complaint  Patient presents with  . Dental Pain     (Consider location/radiation/quality/duration/timing/severity/associated sxs/prior Treatment) Patient is a 39 y.o. male presenting with tooth pain. The history is provided by the patient.  Dental Pain Quality:  Aching and throbbing Severity:  Moderate Onset quality:  Gradual Duration:  1 month Timing:  Intermittent Progression:  Worsening Context: dental caries and poor dentition   Context: not recent dental surgery   Relieved by:  Nothing Ineffective treatments:  NSAIDs Associated symptoms: headaches   Associated symptoms: no drooling and no trismus   Risk factors: smoking   Risk factors: no diabetes and no immunosuppression     History reviewed. No pertinent past medical history. Past Surgical History  Procedure Laterality Date  . Appendectomy     Family History  Problem Relation Age of Onset  . Diabetes Mother   . Heart failure Mother    Social History  Substance Use Topics  . Smoking status: Current Every Day Smoker -- 0.50 packs/day    Types: Cigarettes  . Smokeless tobacco: None  . Alcohol Use: No    Review of Systems  HENT: Positive for dental problem. Negative for drooling.   Neurological: Positive for headaches.  All other systems reviewed and are negative.     Allergies  Review of patient's allergies indicates no known allergies.  Home Medications   Prior to Admission medications   Medication Sig Start Date End Date Taking? Authorizing Provider  aspirin-acetaminophen-caffeine (EXCEDRIN EXTRA STRENGTH) [phone] MG per tablet Take 2 tablets by mouth as needed. FOR PAIN    Historical Provider, MD  cephALEXin (KEFLEX) 500 MG capsule Take 1 capsule (500 mg total) by mouth 4 (four) times daily. 05/31/12   Noemi Chapel, MD  HYDROcodone-acetaminophen (NORCO/VICODIN) 5-325  MG per tablet Take 1 tablet by mouth every 4 (four) hours as needed. 03/02/14   Kristen N Ward, DO  ibuprofen (ADVIL,MOTRIN) 800 MG tablet Take 1 tablet (800 mg total) by mouth every 8 (eight) hours as needed for mild pain. 03/02/14   Kristen N Ward, DO  naproxen (NAPROSYN) 500 MG tablet Take 1 tablet (500 mg total) by mouth 2 (two) times daily with a meal. 05/31/12   Noemi Chapel, MD  oxyCODONE-acetaminophen (PERCOCET) 5-325 MG per tablet Take 1 tablet by mouth every 4 (four) hours as needed for pain. 05/31/12   Noemi Chapel, MD   BP 151/88 mmHg  Pulse 73  Temp(Src) 98.4 F (36.9 C) (Oral)  Resp 16  Ht 5\' 11"  (1.803 m)  Wt 230 lb (104.327 kg)  BMI 32.09 kg/m2  SpO2 99% Physical Exam  Constitutional: He is oriented to person, place, and time. He appears well-developed and well-nourished.  Non-toxic appearance.  HENT:  Head: Normocephalic.  Right Ear: Tympanic membrane and external ear normal.  Left Ear: Tympanic membrane and external ear normal.  Mouth/Throat: No trismus in the jaw. Dental caries present. No dental abscesses or uvula swelling.    Cavity of the right upper molar. Moderate swelling of the gum. No visible abscess. Air way patent. No swelling under the tongue.  Eyes: EOM and lids are normal. Pupils are equal, round, and reactive to light.  Neck: Normal range of motion. Neck supple. Carotid bruit is not present.  Cardiovascular: Normal rate, regular rhythm, normal heart sounds, intact distal pulses and normal pulses.  Pulmonary/Chest: Breath sounds normal. No respiratory distress.  Abdominal: Soft. Bowel sounds are normal. There is no tenderness. There is no guarding.  Musculoskeletal: Normal range of motion.  Lymphadenopathy:       Head (right side): No submandibular adenopathy present.       Head (left side): No submandibular adenopathy present.    He has no cervical adenopathy.  Neurological: He is alert and oriented to person, place, and time. He has normal strength. No  cranial nerve deficit or sensory deficit.  Skin: Skin is warm and dry.  Psychiatric: He has a normal mood and affect. His speech is normal.  Nursing note and vitals reviewed.   ED Course  Procedures (including critical care time) Labs Review Labs Reviewed - No data to display  Imaging Review No results found.   EKG Interpretation None      MDM Vital signs stable. No evidence for Ludwig's Angina. Rx for clindamycin and ibuprofen given.   Final diagnoses:  None    *I have reviewed nursing notes, vital signs, and all appropriate lab and imaging results for this patient.[id number], PA-C 04/05/15 1612  Milton Ferguson, MD 04/05/15 [phone]

## 2017-04-29 ENCOUNTER — Encounter (HOSPITAL_COMMUNITY): Payer: Self-pay | Admitting: Emergency Medicine

## 2017-04-29 ENCOUNTER — Emergency Department (HOSPITAL_COMMUNITY)
Admission: EM | Admit: 2017-04-29 | Discharge: 2017-04-29 | Disposition: A | Discharge status: Home / Self Care | Payer: Medicaid Other | Attending: Emergency Medicine | Admitting: Emergency Medicine

## 2017-04-29 ENCOUNTER — Emergency Department (HOSPITAL_COMMUNITY): Payer: Medicaid Other

## 2017-04-29 DIAGNOSIS — Y999 Unspecified external cause status: Secondary | ICD-10-CM | POA: Insufficient documentation

## 2017-04-29 DIAGNOSIS — X509XXA Other and unspecified overexertion or strenuous movements or postures, initial encounter: Secondary | ICD-10-CM | POA: Insufficient documentation

## 2017-04-29 DIAGNOSIS — M25561 Pain in right knee: Secondary | ICD-10-CM | POA: Diagnosis not present

## 2017-04-29 DIAGNOSIS — M25461 Effusion, right knee: Secondary | ICD-10-CM | POA: Insufficient documentation

## 2017-04-29 DIAGNOSIS — F17210 Nicotine dependence, cigarettes, uncomplicated: Secondary | ICD-10-CM | POA: Insufficient documentation

## 2017-04-29 DIAGNOSIS — Y9369 Activity, other involving other sports and athletics played as a team or group: Secondary | ICD-10-CM | POA: Insufficient documentation

## 2017-04-29 DIAGNOSIS — Z79899 Other long term (current) drug therapy: Secondary | ICD-10-CM | POA: Diagnosis not present

## 2017-04-29 DIAGNOSIS — Y929 Unspecified place or not applicable: Secondary | ICD-10-CM | POA: Insufficient documentation

## 2017-04-29 MED ORDER — HYDROCODONE-ACETAMINOPHEN 5-325 MG PO TABS: ORAL_TABLET | ORAL | 0 refills | Status: DC

## 2017-04-29 MED ORDER — IBUPROFEN 800 MG PO TABS: 800.0000 mg | ORAL_TABLET | Freq: Three times a day (TID) | ORAL | 0 refills | Status: DC

## 2017-04-29 MED ORDER — OXYCODONE-ACETAMINOPHEN 5-325 MG PO TABS
2.0000 | ORAL_TABLET | Freq: Once | ORAL | Status: AC
Start: 2017-04-29 — End: 2017-04-29
  Administered 2017-04-29: 2 via ORAL
  Filled 2017-04-29: qty 2

## 2017-04-29 NOTE — ED Triage Notes (Signed)
Pt states he was playing softball yesterday when he felt his R knee buckle while moving side to side. States previous hx of knee injury in his 69s. Ambulatory to triage.

## 2017-04-29 NOTE — Discharge Instructions (Signed)
Apply ice packs on/off to your knee.  Use your crutches for weight bearing.  Call one of the providers listed to arrange a follow-up appt this week

## 2017-04-29 NOTE — ED Provider Notes (Signed)
[facility] DEPT Provider Note   CSN: [id number] Arrival date & time: 04/29/17  1536     History   Chief Complaint Chief Complaint  Patient presents with  . Knee Pain    right    HPI Victor [name] is a 41 y.o. male.  HPI   Victor [name] is a 41 y.o. male who presents to the Emergency Department complaining of right knee pain for one day.  States that he had a twisting injury to his knee yesterday while playing softball.  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  Pain is worse with weight bearing and bending.  He has tried ice and ibuprofen without relief.     No past medical history on file.  There are no active problems to display for this patient.   Past Surgical History:  Procedure Laterality Date  . APPENDECTOMY         Home Medications    Prior to Admission medications   Medication Sig Start Date End Date Taking? Authorizing Provider  aspirin-acetaminophen-caffeine (EXCEDRIN EXTRA STRENGTH) [phone] MG per tablet Take 2 tablets by mouth as needed. FOR PAIN    [provider]  cephALEXin (KEFLEX) 500 MG capsule Take 1 capsule (500 mg total) by mouth 4 (four) times daily. 05/31/12   Noemi Chapel, MD  clindamycin (CLEOCIN) 150 MG capsule 2 po tid 04/05/15   Lily Kocher, PA-C  HYDROcodone-acetaminophen (NORCO/VICODIN) 5-325 MG per tablet Take 1 tablet by mouth every 4 (four) hours as needed. 03/02/14   Ward, Delice Bison, DO  ibuprofen (ADVIL,MOTRIN) 800 MG tablet Take 1 tablet (800 mg total) by mouth 3 (three) times daily. 04/05/15   Lily Kocher, PA-C  naproxen (NAPROSYN) 500 MG tablet Take 1 tablet (500 mg total) by mouth 2 (two) times daily with a meal. 05/31/12   Noemi Chapel, MD  oxyCODONE-acetaminophen (PERCOCET) 5-325 MG per tablet Take 1 tablet by mouth every 4 (four) hours as needed for pain. 05/31/12   Noemi Chapel, MD    Family  History Family History  Problem Relation Age of Onset  . Diabetes Mother   . Heart failure Mother     Social History Social History  Substance Use Topics  . Smoking status: Current Every Day Smoker    Packs/day: 0.50    Types: Cigarettes  . Smokeless tobacco: Never Used  . Alcohol use No     Allergies   Patient has no known allergies.   Review of Systems Review of Systems  Constitutional: Negative for chills and fever.  Genitourinary: Negative for difficulty urinating and dysuria.  Musculoskeletal: Positive for arthralgias and joint swelling.  Skin: Negative for color change and wound.  All other systems reviewed and are negative.    Physical Exam Updated Vital Signs BP 120/73 (BP Location: Right Arm)   Pulse 90   Temp 98.7 F (37.1 C) (Oral)   Resp 18   Ht 5\' 11"  (1.803 m)   Wt 106.6 kg (235 lb)   SpO2 97%   BMI 32.78 kg/m   Physical Exam  Constitutional: He is oriented to person, place, and time. He appears well-developed and well-nourished. No distress.  Cardiovascular: Normal rate, regular rhythm and intact distal pulses.   Pulmonary/Chest: Effort normal and breath sounds normal.  Musculoskeletal: He exhibits edema and tenderness.  ttp of the anterior right knee. Mild edema.   No erythema, effusion,  or step-off deformity.  Calf is soft and NT.  Neurological: He is alert and oriented to person, place, and time. No sensory deficit. He exhibits normal muscle tone. Coordination normal.  Skin: Skin is warm and dry. Capillary refill takes less than 2 seconds. No erythema.  Nursing note and vitals reviewed.    ED Treatments / Results  Labs (all labs ordered are listed, but only abnormal results are displayed) Labs Reviewed - No data to display  EKG  EKG Interpretation None       Radiology Dg Knee Complete 4 Views Right  Result Date: 04/29/2017 CLINICAL DATA:  Severe right knee pain and swelling. Knee injury playing softball yesterday. Initial  encounter. EXAM: RIGHT KNEE - COMPLETE 4+ VIEW COMPARISON:  03/02/2014 FINDINGS: No evidence of fracture, dislocation, or joint effusion. Minimal degenerative spurring of the tibial spines and patella noted, without joint space narrowing. No other focal bone abnormality. Soft tissues are unremarkable. IMPRESSION: No acute findings.  Minimal degenerative spurring. Electronically Signed   By: Earle Gell M.D.   On: 04/29/2017 16:25     Procedures Procedures (including critical care time)  Medications Ordered in ED Medications - No data to display   Initial Impression / Assessment and Plan / ED Course  I have reviewed the triage vital signs and the nursing notes.  Pertinent labs & imaging results that were available during my care of the patient were reviewed by me and considered in my medical decision making (see chart for details).     Pain to the right knee after a twisting injury and hx of previous patella dislocation.  No dislocation on XR.  No effusion.    Knee immob applied.  Crutches given. Pain improved.  NV intact.   Agrees to orthopedic f/u  Final Clinical Impressions(s) / ED Diagnoses   Final diagnoses:  Acute pain of right knee    New Prescriptions New Prescriptions   No medications on file     Kem Parkinson, Hershal Coria 05/01/17 2254    Mesner, Corene Cornea, MD 05/02/17 1004

## 2017-05-01 ENCOUNTER — Ambulatory Visit (INDEPENDENT_AMBULATORY_CARE_PROVIDER_SITE_OTHER): Payer: Self-pay | Admitting: Orthopaedic Surgery

## 2017-05-01 ENCOUNTER — Encounter: Payer: Self-pay | Admitting: Orthopaedic Surgery

## 2017-05-01 VITALS — BP 152/100 | HR 83 | Temp 97.4°F | Ht 70.25 in | Wt 247.0 lb

## 2017-05-01 DIAGNOSIS — F17210 Nicotine dependence, cigarettes, uncomplicated: Secondary | ICD-10-CM

## 2017-05-01 DIAGNOSIS — M25561 Pain in right knee: Secondary | ICD-10-CM

## 2017-05-01 NOTE — Patient Instructions (Addendum)
Steps to Quit Smoking Smoking tobacco can be bad for your health. It can also affect almost every organ in your body. Smoking puts you and people around you at risk for many serious long-lasting (chronic) diseases. Quitting smoking is hard, but it is one of the best things that you can do for your health. It is never too late to quit. What are the benefits of quitting smoking? When you quit smoking, you lower your risk for getting serious diseases and conditions. They can include:  Lung cancer or lung disease.  Heart disease.  Stroke.  Heart attack.  Not being able to have children (infertility).  Weak bones (osteoporosis) and broken bones (fractures).  If you have coughing, wheezing, and shortness of breath, those symptoms Pelzer get better when you quit. You [place] also get sick less often. If you are pregnant, quitting smoking can help to lower your chances of having a baby of low birth weight. What can I do to help me quit smoking? Talk with your doctor about what can help you quit smoking. Some things you can do (strategies) include:  Quitting smoking totally, instead of slowly cutting back how much you smoke over a period of time.  Going to in-person counseling. You are more likely to quit if you go to many counseling sessions.  Using resources and support systems, such as: ? Online chats with a counselor. ? Phone quitlines. ? Printed self-help materials. ? Support groups or group counseling. ? Text messaging programs. ? Mobile phone apps or applications.  Taking medicines. Some of these medicines [place] have nicotine in them. If you are pregnant or breastfeeding, do not take any medicines to quit smoking unless your doctor says it is okay. Talk with your doctor about counseling or other things that can help you.  Talk with your doctor about using more than one strategy at the same time, such as taking medicines while you are also going to in-person counseling. This can help make  quitting easier. What things can I do to make it easier to quit? Quitting smoking might feel very hard at first, but there is a lot that you can do to make it easier. Take these steps:  Talk to your family and friends. Ask them to support and encourage you.  Call phone quitlines, reach out to support groups, or work with a counselor.  Ask people who smoke to not smoke around you.  Avoid places that make you want (trigger) to smoke, such as: ? Bars. ? Parties. ? Smoke-break areas at work.  Spend time with people who do not smoke.  Lower the stress in your life. Stress can make you want to smoke. Try these things to help your stress: ? Getting regular exercise. ? Deep-breathing exercises. ? Yoga. ? Meditating. ? Doing a body scan. To do this, close your eyes, focus on one area of your body at a time from head to toe, and notice which parts of your body are tense. Try to relax the muscles in those areas.  Download or buy apps on your mobile phone or tablet that can help you stick to your quit plan. There are many free apps, such as QuitGuide from the CDC (Centers for Disease Control and Prevention). You can find more support from smokefree.gov and other websites.  This information is not intended to replace advice given to you by your health care provider. Make sure you discuss any questions you have with your health care provider. Document Released: 06/08/2009 Document   Revised: 04/09/2016 Document Reviewed: 12/27/2014 Elsevier Interactive Patient Education  2018 [employer].  Steps to Quit Smoking Smoking tobacco can be bad for your health. It can also affect almost every organ in your body. Smoking puts you and people around you at risk for many serious long-lasting (chronic) diseases. Quitting smoking is hard, but it is one of the best things that you can do for your health. It is never too late to quit. What are the benefits of quitting smoking? When you quit smoking, you lower  your risk for getting serious diseases and conditions. They can include:  Lung cancer or lung disease.  Heart disease.  Stroke.  Heart attack.  Not being able to have children (infertility).  Weak bones (osteoporosis) and broken bones (fractures).  If you have coughing, wheezing, and shortness of breath, those symptoms Head get better when you quit. You Ke[place] also get sick less often. If you are pregnant, quitting smoking can help to lower your chances of having a baby of low birth weight. What can I do to help me quit smoking? Talk with your doctor about what can help you quit smoking. Some things you can do (strategies) include:  Quitting smoking totally, instead of slowly cutting back how much you smoke over a period of time.  Going to in-person counseling. You are more likely to quit if you go to many counseling sessions.  Using resources and support systems, such as: ? Database administrator with a Social worker. ? Phone quitlines. ? Careers information officer. ? Support groups or group counseling. ? Text messaging programs. ? Mobile phone apps or applications.  Taking medicines. Some of these medicines [place] have nicotine in them. If you are pregnant or breastfeeding, do not take any medicines to quit smoking unless your doctor says it is okay. Talk with your doctor about counseling or other things that can help you.  Talk with your doctor about using more than one strategy at the same time, such as taking medicines while you are also going to in-person counseling. This can help make quitting easier. What things can I do to make it easier to quit? Quitting smoking might feel very hard at first, but there is a lot that you can do to make it easier. Take these steps:  Talk to your family and friends. Ask them to support and encourage you.  Call phone quitlines, reach out to support groups, or work with a Social worker.  Ask people who smoke to not smoke around you.  Avoid places that make you  want (trigger) to smoke, such as: ? Bars. ? Parties. ? Smoke-break areas at work.  Spend time with people who do not smoke.  Lower the stress in your life. Stress can make you want to smoke. Try these things to help your stress: ? Getting regular exercise. ? Deep-breathing exercises. ? Yoga. ? Meditating. ? Doing a body scan. To do this, close your eyes, focus on one area of your body at a time from head to toe, and notice which parts of your body are tense. Try to relax the muscles in those areas.  Download or buy apps on your mobile phone or tablet that can help you stick to your quit plan. There are many free apps, such as QuitGuide from the [employer] Office manager for Disease Control and Prevention). You can find more support from smokefree.gov and other websites.  This information is not intended to replace advice given to you by your health care provider. Make sure  you discuss any questions you have with your health care provider. Document Released: 06/08/2009 Document Revised: 04/09/2016 Document Reviewed: 12/27/2014 Elsevier Interactive Patient Education  2018 [place] of [employer] for this coming week.

## 2017-05-01 NOTE — Progress Notes (Signed)
Subjective:    Patient ID: Victor [name], male    DOB: [birth year], 41 y.o.   MRN: [id number]  HPI He hurt his right knee playing softball on 04-28-17.  He went to the ER the following day and was seen and evaluated.  X-rays were done and show no acute findings.  He has history of right knee pain that comes and goes.  He has had some instability and giving way and swelling.    He was given knee immobilizer and crutches.  He has swelling still with more pain laterally.  He has feeling of the knee "slipping" when trying to bear any weight.  He has pain at night with the knee that awakens him.  He has no redness.  He has used ice, Tylenol and Advil.  He still hurts.  I have told him he has strained the lateral collateral ligament but also most likely has torn meniscus and possible ACL strain/tear.  He needs MRI.  He has no insurance and I have referred him to [employer]  He is to use crutches.  He is to get brace. He is to stay out of work rest of this week.  He would like to go back to work next week but that is up to him.  I do not recommend it.   Review of Systems  HENT: Negative for congestion.   Respiratory: Negative for cough and shortness of breath.   Cardiovascular: Negative for chest pain and leg swelling.  Endocrine: Negative for cold intolerance.  Musculoskeletal: Positive for arthralgias, gait problem and joint swelling.  Allergic/Immunologic: Negative for environmental allergies.   Past Medical History:  Diagnosis Date  . History of stomach ulcers     Past Surgical History:  Procedure Laterality Date  . APPENDECTOMY      Current Outpatient Prescriptions on File Prior to Visit  Medication Sig Dispense Refill  . aspirin-acetaminophen-caffeine (EXCEDRIN EXTRA STRENGTH) 250-250-65 MG per tablet Take 2 tablets by mouth as needed. FOR PAIN    . cephALEXin (KEFLEX) 500 MG capsule Take 1 capsule (500 mg total) by mouth 4 (four) times daily. 40 capsule 0  . clindamycin  (CLEOCIN) 150 MG capsule 2 po tid 36 capsule 0  . HYDROcodone-acetaminophen (NORCO/VICODIN) 5-325 MG tablet Take one tabpo q 4 hrs prn pain 15 tablet 0  . ibuprofen (ADVIL,MOTRIN) 800 MG tablet Take 1 tablet (800 mg total) by mouth 3 (three) times daily. 21 tablet 0  . oxyCODONE-acetaminophen (PERCOCET) 5-325 MG per tablet Take 1 tablet by mouth every 4 (four) hours as needed for pain. 20 tablet 0   No current facility-administered medications on file prior to visit.     Social History   Social History  . Marital status: Married    Spouse name: N/A  . Number of children: N/A  . Years of education: N/A   Occupational History  . Not on file.   Social History Main Topics  . Smoking status: Current Every Day Smoker    Packs/day: 0.50    Types: Cigarettes  . Smokeless tobacco: Never Used  . Alcohol use No  . Drug use: No  . Sexual activity: Not on file   Other Topics Concern  . Not on file   Social History Narrative  . No narrative on file    Family History  Problem Relation Age of Onset  . Diabetes Mother   . Heart failure Mother   . Cancer Mother   . Cancer Father  BP (!) 152/100   Pulse 83   Temp (!) 97.4 F (36.3 C)   Ht 5' 10.25" (1.784 m)   Wt 247 lb (112 kg)   BMI 35.19 kg/m      Objective:   Physical Exam  Constitutional: He is oriented to person, place, and time. He appears well-developed and well-nourished.  HENT:  Head: Normocephalic and atraumatic.  Eyes: Pupils are equal, round, and reactive to light. Conjunctivae and EOM are normal.  Neck: Normal range of motion. Neck supple.  Cardiovascular: Normal rate, regular rhythm and intact distal pulses.   Pulmonary/Chest: Effort normal.  Abdominal: Soft.  Musculoskeletal: He exhibits tenderness (Pain right knee, 2+ effusion, more pain over LCL, weakly positive drawer, ROM 5 to 90 with pain, NV intact.  Left knee negative.  On crutches.).  Neurological: He is alert and oriented to person, place, and  time. He has normal reflexes. He displays normal reflexes. No cranial nerve deficit. He exhibits normal muscle tone. Coordination normal.  Skin: Skin is warm and dry.  Psychiatric: He has a normal mood and affect. His behavior is normal. Judgment and thought content normal.  Vitals reviewed.         Assessment & Plan:   Encounter Diagnoses  Name Primary?  . Acute pain of right knee Yes  . Cigarette nicotine dependence without complication    I have recommended possibility of MRI.  See above.  He is to use knee immobilizer, crutches, ice, Aleve.  Return here in one week.  Stay out of work.  If he gets [employer], then schedule MRI.  Call if any problem.  Precautions discussed.   Electronically Signed Sanjuana Kava, MD 9/6/201811:20 AM

## 2017-05-07 ENCOUNTER — Ambulatory Visit (INDEPENDENT_AMBULATORY_CARE_PROVIDER_SITE_OTHER): Payer: Self-pay | Admitting: Orthopaedic Surgery

## 2017-05-07 ENCOUNTER — Telehealth: Payer: Self-pay | Admitting: Orthopaedic Surgery

## 2017-05-07 ENCOUNTER — Encounter: Payer: Self-pay | Admitting: Orthopaedic Surgery

## 2017-05-07 VITALS — BP 143/93 | HR 90 | Temp 98.2°F | Ht 70.25 in | Wt 245.0 lb

## 2017-05-07 DIAGNOSIS — M25561 Pain in right knee: Secondary | ICD-10-CM

## 2017-05-07 NOTE — Telephone Encounter (Signed)
Angie from [facility] called and stated that they need a new order for pt's MRI of the right knee.  She stated the other order was not signed.    Please fax new order to her at [place]  Fax # [phone]  If you have questions, her phone number is [phone]  Thanks

## 2017-05-07 NOTE — Progress Notes (Signed)
Patient Victor [name], male DOB:Nov 13, 1975, 41 y.o. CWC:[id number]  Chief Complaint  Patient presents with  . Follow-up    Right knee pain    HPI  Victor [name] is a 41 y.o. male who has continued pain of the right knee and instability.  He has more pain laterally.  He is getting worse. He has been trying to get some insurance.  He wants to proceed with the MRI at the discounted price offered in [place].  We will arrange this.  He has internal derangement of the knee by history and examination.  The MRI will give proper information in order to decide if surgery is needed and what type. HPI  Body mass index is 34.9 kg/m.  ROS  Review of Systems  HENT: Negative for congestion.   Respiratory: Negative for cough and shortness of breath.   Cardiovascular: Negative for chest pain and leg swelling.  Endocrine: Negative for cold intolerance.  Musculoskeletal: Positive for arthralgias, gait problem and joint swelling.  Allergic/Immunologic: Negative for environmental allergies.    Past Medical History:  Diagnosis Date  . History of stomach ulcers     Past Surgical History:  Procedure Laterality Date  . APPENDECTOMY      Family History  Problem Relation Age of Onset  . Diabetes Mother   . Heart failure Mother   . Cancer Mother   . Cancer Father     Social History Social History  Substance Use Topics  . Smoking status: Current Every Day Smoker    Packs/day: 0.50    Types: Cigarettes  . Smokeless tobacco: Never Used  . Alcohol use No    No Known Allergies  Current Outpatient Prescriptions  Medication Sig Dispense Refill  . aspirin-acetaminophen-caffeine (EXCEDRIN EXTRA STRENGTH) 250-250-65 MG per tablet Take 2 tablets by mouth as needed. FOR PAIN    . cephALEXin (KEFLEX) 500 MG capsule Take 1 capsule (500 mg total) by mouth 4 (four) times daily. 40 capsule 0  . clindamycin (CLEOCIN) 150 MG capsule 2 po tid 36 capsule 0  . HYDROcodone-acetaminophen (NORCO/VICODIN)  5-325 MG tablet Take one tabpo q 4 hrs prn pain 15 tablet 0  . ibuprofen (ADVIL,MOTRIN) 800 MG tablet Take 1 tablet (800 mg total) by mouth 3 (three) times daily. 21 tablet 0  . oxyCODONE-acetaminophen (PERCOCET) 5-325 MG per tablet Take 1 tablet by mouth every 4 (four) hours as needed for pain. 20 tablet 0   No current facility-administered medications for this visit.      Physical Exam  Blood pressure (!) 143/93, pulse 90, temperature 98.2 F (36.8 C), height 5' 10.25" (1.784 m), weight 245 lb (111.1 kg).  Constitutional: overall normal hygiene, normal nutrition, well developed, normal grooming, normal body habitus. Assistive device:none  Musculoskeletal: gait and station Limp right, muscle tone and strength are normal, no tremors or atrophy is present.  .  Neurological: coordination overall normal.  Deep tendon reflex/nerve stretch intact.  Sensation normal.  Cranial nerves II-XII intact.   Skin:   Normal overall no scars, lesions, ulcers or rashes. No psoriasis.  Psychiatric: Alert and oriented x 3.  Recent memory intact, remote memory unclear.  Normal mood and affect. Well groomed.  Good eye contact.  Cardiovascular: overall no swelling, no varicosities, no edema bilaterally, normal temperatures of the legs and arms, no clubbing, cyanosis and good capillary refill.  Lymphatic: palpation is normal.  All other systems reviewed and are negative   Right knee has marked pain, ROM 5 to 95, lateral  pain and instability laterally.  NV intact.  The patient has been educated about the nature of the problem(s) and counseled on treatment options.  The patient appeared to understand what I have discussed and is in agreement with it.  Encounter Diagnosis  Name Primary?  . Acute pain of right knee Yes    PLAN Call if any problems.  Precautions discussed.  Continue current medications.   Return to clinic after MRI of the knee on the right.   Stay out of work.  Electronically  Signed Sanjuana Kava, MD 9/12/201810:17 AM

## 2017-05-07 NOTE — Patient Instructions (Signed)
Steps to Quit Smoking Smoking tobacco can be bad for your health. It can also affect almost every organ in your body. Smoking puts you and people around you at risk for many serious Orlandria Kissner-lasting (chronic) diseases. Quitting smoking is hard, but it is one of the best things that you can do for your health. It is never too late to quit. What are the benefits of quitting smoking? When you quit smoking, you lower your risk for getting serious diseases and conditions. They can include:  Lung cancer or lung disease.  Heart disease.  Stroke.  Heart attack.  Not being able to have children (infertility).  Weak bones (osteoporosis) and broken bones (fractures).  If you have coughing, wheezing, and shortness of breath, those symptoms [place] get better when you quit. You [place] also get sick less often. If you are pregnant, quitting smoking can help to lower your chances of having a baby of low birth weight. What can I do to help me quit smoking? Talk with your doctor about what can help you quit smoking. Some things you can do (strategies) include:  Quitting smoking totally, instead of slowly cutting back how much you smoke over a period of time.  Going to in-person counseling. You are more likely to quit if you go to many counseling sessions.  Using resources and support systems, such as: ? Online chats with a counselor. ? Phone quitlines. ? Printed self-help materials. ? Support groups or group counseling. ? Text messaging programs. ? Mobile phone apps or applications.  Taking medicines. Some of these medicines Ging have nicotine in them. If you are pregnant or breastfeeding, do not take any medicines to quit smoking unless your doctor says it is okay. Talk with your doctor about counseling or other things that can help you.  Talk with your doctor about using more than one strategy at the same time, such as taking medicines while you are also going to in-person counseling. This can help make  quitting easier. What things can I do to make it easier to quit? Quitting smoking might feel very hard at first, but there is a lot that you can do to make it easier. Take these steps:  Talk to your family and friends. Ask them to support and encourage you.  Call phone quitlines, reach out to support groups, or work with a counselor.  Ask people who smoke to not smoke around you.  Avoid places that make you want (trigger) to smoke, such as: ? Bars. ? Parties. ? Smoke-break areas at work.  Spend time with people who do not smoke.  Lower the stress in your life. Stress can make you want to smoke. Try these things to help your stress: ? Getting regular exercise. ? Deep-breathing exercises. ? Yoga. ? Meditating. ? Doing a body scan. To do this, close your eyes, focus on one area of your body at a time from head to toe, and notice which parts of your body are tense. Try to relax the muscles in those areas.  Download or buy apps on your mobile phone or tablet that can help you stick to your quit plan. There are many free apps, such as QuitGuide from the CDC (Centers for Disease Control and Prevention). You can find more support from smokefree.gov and other websites.  This information is not intended to replace advice given to you by your health care provider. Make sure you discuss any questions you have with your health care provider. Document Released: 06/08/2009 Document   Revised: 04/09/2016 Document Reviewed: 12/27/2014 Elsevier Interactive Patient Education  2018 Elsevier Inc.  

## 2017-05-08 ENCOUNTER — Encounter: Payer: Self-pay | Admitting: Orthopaedic Surgery

## 2017-05-08 ENCOUNTER — Telehealth: Payer: Self-pay | Admitting: Radiology

## 2017-05-08 NOTE — Telephone Encounter (Signed)
Done

## 2017-05-08 NOTE — Telephone Encounter (Signed)
faxed

## 2017-05-08 NOTE — Telephone Encounter (Signed)
The patient request a new note for work that states he can work with the brace on with limited duty. Dr. Luna Glasgow said to go give him the note.

## 2017-05-14 ENCOUNTER — Encounter: Payer: Self-pay | Admitting: Orthopaedic Surgery

## 2017-05-14 ENCOUNTER — Ambulatory Visit (INDEPENDENT_AMBULATORY_CARE_PROVIDER_SITE_OTHER): Payer: Medicaid Other | Admitting: Orthopaedic Surgery

## 2017-05-14 VITALS — BP 145/91 | HR 89 | Ht 70.25 in | Wt 247.0 lb

## 2017-05-14 DIAGNOSIS — M25561 Pain in right knee: Secondary | ICD-10-CM

## 2017-05-14 DIAGNOSIS — F17210 Nicotine dependence, cigarettes, uncomplicated: Secondary | ICD-10-CM | POA: Diagnosis not present

## 2017-05-14 DIAGNOSIS — M23200 Derangement of unspecified lateral meniscus due to old tear or injury, right knee: Secondary | ICD-10-CM | POA: Diagnosis not present

## 2017-05-14 DIAGNOSIS — S83241A Other tear of medial meniscus, current injury, right knee, initial encounter: Secondary | ICD-10-CM

## 2017-05-14 NOTE — Patient Instructions (Signed)
Steps to Quit Smoking Smoking tobacco can be bad for your health. It can also affect almost every organ in your body. Smoking puts you and people around you at risk for many serious Victor [name] (chronic) diseases. Quitting smoking is hard, but it is one of the best things that you can do for your health. It is never too late to quit. What are the benefits of quitting smoking? When you quit smoking, you lower your risk for getting serious diseases and conditions. They can include:  Lung cancer or lung disease.  Heart disease.  Stroke.  Heart attack.  Not being able to have children (infertility).  Weak bones (osteoporosis) and broken bones (fractures).  If you have coughing, wheezing, and shortness of breath, those symptoms Victor [name] get better when you quit. You Victor [name] also get sick less often. If you are pregnant, quitting smoking can help to lower your chances of having a baby of low birth weight. What can I do to help me quit smoking? Talk with your doctor about what can help you quit smoking. Some things you can do (strategies) include:  Quitting smoking totally, instead of slowly cutting back how much you smoke over a period of time.  Going to in-person counseling. You are more likely to quit if you go to many counseling sessions.  Using resources and support systems, such as: ? Online chats with a counselor. ? Phone quitlines. ? Printed self-help materials. ? Support groups or group counseling. ? Text messaging programs. ? Mobile phone apps or applications.  Taking medicines. Some of these medicines Victor [name] have nicotine in them. If you are pregnant or breastfeeding, do not take any medicines to quit smoking unless your doctor says it is okay. Talk with your doctor about counseling or other things that can help you.  Talk with your doctor about using more than one strategy at the same time, such as taking medicines while you are also going to in-person counseling. This can help make  quitting easier. What things can I do to make it easier to quit? Quitting smoking might feel very hard at first, but there is a lot that you can do to make it easier. Take these steps:  Talk to your family and friends. Ask them to support and encourage you.  Call phone quitlines, reach out to support groups, or work with a counselor.  Ask people who smoke to not smoke around you.  Avoid places that make you want (trigger) to smoke, such as: ? Bars. ? Parties. ? Smoke-break areas at work.  Spend time with people who do not smoke.  Lower the stress in your life. Stress can make you want to smoke. Try these things to help your stress: ? Getting regular exercise. ? Deep-breathing exercises. ? Yoga. ? Meditating. ? Doing a body scan. To do this, close your eyes, focus on one area of your body at a time from head to toe, and notice which parts of your body are tense. Try to relax the muscles in those areas.  Download or buy apps on your mobile phone or tablet that can help you stick to your quit plan. There are many free apps, such as QuitGuide from the CDC (Centers for Disease Control and Prevention). You can find more support from smokefree.gov and other websites.  This information is not intended to replace advice given to you by your health care provider. Make sure you discuss any questions you have with your health care provider. Document Released: 06/08/2009 Document   Revised: 04/09/2016 Document Reviewed: 12/27/2014 Elsevier Interactive Patient Education  2018 Elsevier Inc.  

## 2017-05-14 NOTE — Progress Notes (Signed)
Patient SW:Victor [name], male DOB:1976/06/25, 41 y.o. FTD:[id number]  Chief Complaint  Patient presents with  . Results    MRI Right knee    HPI  Victor [name] is a 41 y.o. male who has right knee pain.  He got a MRI at [facility] on 05-09-17 showing a peripheral vertical tear of the posterior horn of the right medial meniscus, horizontal tear lateral meniscus with adjacent para-meniscal cyst and some lateral patellar chondromalacia.  I have recommended arthroscopy of the right knee. He is wearing a brace and has giving way without it.  He is agreeable.  He will see Dr. Aline Brochure. HPI  Body mass index is 35.19 kg/m.  ROS  Review of Systems  HENT: Negative for congestion.   Respiratory: Negative for cough and shortness of breath.   Cardiovascular: Negative for chest pain and leg swelling.  Endocrine: Negative for cold intolerance.  Musculoskeletal: Positive for arthralgias, gait problem and joint swelling.  Allergic/Immunologic: Negative for environmental allergies.    Past Medical History:  Diagnosis Date  . History of stomach ulcers     Past Surgical History:  Procedure Laterality Date  . APPENDECTOMY      Family History  Problem Relation Age of Onset  . Diabetes Mother   . Heart failure Mother   . Cancer Mother   . Cancer Father     Social History Social History  Substance Use Topics  . Smoking status: Current Every Day Smoker    Packs/day: 0.50    Types: Cigarettes  . Smokeless tobacco: Never Used  . Alcohol use No    No Known Allergies  Current Outpatient Prescriptions  Medication Sig Dispense Refill  . aspirin-acetaminophen-caffeine (EXCEDRIN EXTRA STRENGTH) 250-250-65 MG per tablet Take 2 tablets by mouth as needed. FOR PAIN    . cephALEXin (KEFLEX) 500 MG capsule Take 1 capsule (500 mg total) by mouth 4 (four) times daily. 40 capsule 0  . clindamycin (CLEOCIN) 150 MG capsule 2 po tid 36 capsule 0  . HYDROcodone-acetaminophen (NORCO/VICODIN) 5-325  MG tablet Take one tabpo q 4 hrs prn pain 15 tablet 0  . ibuprofen (ADVIL,MOTRIN) 800 MG tablet Take 1 tablet (800 mg total) by mouth 3 (three) times daily. 21 tablet 0  . oxyCODONE-acetaminophen (PERCOCET) 5-325 MG per tablet Take 1 tablet by mouth every 4 (four) hours as needed for pain. 20 tablet 0   No current facility-administered medications for this visit.      Physical Exam  Blood pressure (!) 145/91, pulse 89, height 5' 10.25" (1.784 m), weight 247 lb (112 kg).  Constitutional: overall normal hygiene, normal nutrition, well developed, normal grooming, normal body habitus. Assistive device:hinge knee brace  Musculoskeletal: gait and station Limp right, muscle tone and strength are normal, no tremors or atrophy is present.  .  Neurological: coordination overall normal.  Deep tendon reflex/nerve stretch intact.  Sensation normal.  Cranial nerves II-XII intact.   Skin:   Normal overall no scars, lesions, ulcers or rashes. No psoriasis.  Psychiatric: Alert and oriented x 3.  Recent memory intact, remote memory unclear.  Normal mood and affect. Well groomed.  Good eye contact.  Cardiovascular: overall no swelling, no varicosities, no edema bilaterally, normal temperatures of the legs and arms, no clubbing, cyanosis and good capillary refill.  Lymphatic: palpation is normal.  All other systems reviewed and are negative   Right knee with effusion and crepitus, ROM 0 to 110 with more medial pain and positive medial McMurray.  Knee  otherwise stable.  Limp to the right.  He has pain.  The patient has been educated about the nature of the problem(s) and counseled on treatment options.  The patient appeared to understand what I have discussed and is in agreement with it.  Encounter Diagnoses  Name Primary?  . Acute medial meniscus tear of right knee, initial encounter Yes  . 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 Acute pain of right knee   . Cigarette nicotine  dependence without complication    He smokes and knows he should cut back or quit.  PLAN Call if any problems.  Precautions discussed.  Continue current medications.   Return to clinic to see Dr. Aline Brochure for possible right knee arthroscopy.   Electronically Signed Sanjuana Kava, MD 9/19/20183:33 PM

## 2017-05-21 ENCOUNTER — Ambulatory Visit (INDEPENDENT_AMBULATORY_CARE_PROVIDER_SITE_OTHER): Payer: Medicaid Other | Admitting: Orthopedic Surgery

## 2017-05-21 ENCOUNTER — Encounter: Payer: Self-pay | Admitting: Orthopedic Surgery

## 2017-05-21 VITALS — BP 137/96 | HR 76 | Ht 70.25 in | Wt 248.0 lb

## 2017-05-21 DIAGNOSIS — S83241D Other tear of medial meniscus, current injury, right knee, subsequent encounter: Secondary | ICD-10-CM | POA: Diagnosis not present

## 2017-05-21 DIAGNOSIS — M23200 Derangement of unspecified lateral meniscus due to old tear or injury, right knee: Secondary | ICD-10-CM | POA: Diagnosis not present

## 2017-05-21 DIAGNOSIS — F17210 Nicotine dependence, cigarettes, uncomplicated: Secondary | ICD-10-CM | POA: Diagnosis not present

## 2017-05-21 MED ORDER — IBUPROFEN 800 MG PO TABS: 800.0000 mg | ORAL_TABLET | Freq: Three times a day (TID) | ORAL | 0 refills | Status: DC

## 2017-05-21 MED ORDER — HYDROCODONE-ACETAMINOPHEN 5-325 MG PO TABS: 1.0000 | ORAL_TABLET | Freq: Four times a day (QID) | ORAL | 0 refills | Status: DC | PRN

## 2017-05-21 NOTE — Addendum Note (Signed)
Addended by: Arther Abbott E on: 05/21/2017 02:00 PM   Modules accepted: Orders, SmartSet

## 2017-05-21 NOTE — Progress Notes (Signed)
PREOP SURGERY CONSUL//OFFICE VISIT    Chief Complaint  Patient presents with  . Follow-up    Right knee, disuss surgery    41 year old male percent for preop evaluation for possible surgery right knee  The patient indicates that he injured his knee in the late 63s playing basketball. He sustained a noncontact knee injury severe pop swelling. He took 3-4 months to get better and since that time has had frequent giving way and popping episodes especially with pivoting.  He reinjured himself playing softball. Again noncontact injury. Pain medial and lateral joint swelling decreased range of motion especially extension. He was seen by Dr. Luna Glasgow eventually had an MRI of the knee at no clot [facility].  The imaging report was read out as a torn lateral meniscus torn medial meniscus with an intact anterior cruciate ligament and mild chondromalacia of the lateral compartment.  However, I do not see any anterior cruciate ligament tissue I do see the medial meniscus on questioning if there is a lateral meniscal tear all.  I made the patient aware that I have had some difficulty with these images with the report as well as the quality of the film      Review of Systems  Musculoskeletal: Positive for joint pain.  All other systems reviewed and are negative.    Past Medical History:  Diagnosis Date  . History of stomach ulcers     Past Surgical History:  Procedure Laterality Date  . APPENDECTOMY      Family History  Problem Relation Age of Onset  . Diabetes Mother   . Heart failure Mother   . Cancer Mother   . Cancer Father    Social History  Substance Use Topics  . Smoking status: Current Every Day Smoker    Packs/day: 0.50    Types: Cigarettes  . Smokeless tobacco: Never Used  . Alcohol use No    BP (!) 137/96   Pulse 76   Ht 5' 10.25" (1.784 m)   Wt 248 lb (112.5 kg)   BMI 35.33 kg/m   Physical Exam  Constitutional: He is oriented to person, place, and  time. He appears well-developed and well-nourished.  Vital signs have been reviewed and are stable. Gen. appearance the patient is well-developed and well-nourished with normal grooming and hygiene.   Musculoskeletal:       Right knee: Medial joint line and lateral joint line tenderness noted.  GAIT IS abnormal. He has a noticeable limp. His stance phases short his stride length is short his cadence is slow  Neurological: He is alert and oriented to person, place, and time.  Skin: Skin is warm and dry. No erythema.  Psychiatric: He has a normal mood and affect.  Vitals reviewed.   Left Knee Exam   Tenderness  None  Range of Motion  Normal left knee ROM  Muscle Strength  Normal left knee strength  Tests  Lachman:  Anterior - 2+    Posterior - n/t Drawer:       Anterior - Trace     Posterior - Negative Pivot Shift: 1+  Right Knee Exam   Tenderness  The patient is experiencing tenderness in the medial joint line, lateral joint line.  Range of Motion  Extension: 10 Flexion:     130  Tests  McMurrays:  Medial - Positive       Lachman:  Anterior - Trace     Drawer:       Anterior - Negative  Posterior - Negative  Comments:  Normal neurovascular exam       Encounter Diagnoses  Name Primary?  . 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 Cigarette nicotine dependence without complication   . Acute medial meniscus tear of right knee, subsequent encounter Yes     PLAN:   I have made [name] aware that he has Medicaid and will not be of 2 physical therapy more than 3 visits. His Medicaid into an October. He understands that he will not have a normal knee after this surgery.  He also understands the risks of further knee surgery in the future. 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 Alternative treatment has also been reviewed, questions were encouraged and answered. The postoperative plan is also been reviewed.

## 2017-05-21 NOTE — Patient Instructions (Addendum)
Meniscus Injury, Arthroscopy Arthroscopy is a surgical procedure that involves the use of a small scope that has a camera and surgical instruments on the end (arthroscope). An arthroscope can be used to repair your meniscus injury.  LET YOUR HEALTH CARE PROVIDER KNOW ABOUT:  Any allergies you have.  All medicines you are taking, including vitamins, herbs, eyedrops, creams, and over-the-counter medicines.  Any recent colds or infections you have had or currently have.  Previous problems you or members of your family have had with the use of anesthetics.  Any blood disorders or blood clotting problems you have.  Previous surgeries you have had.  Medical conditions you have. RISKS AND COMPLICATIONS Generally, this is a safe procedure. However, as with any procedure, problems can occur. Possible problems include:  Damage to nerves or blood vessels.  Excess bleeding.  Blood clots.  Infection. BEFORE THE PROCEDURE  Do not eat or drink for 6-8 hours before the procedure.  Take medicines as directed by your surgeon. Ask your surgeon about changing or stopping your regular medicines.  You Knudtson have lab tests the morning of surgery. PROCEDURE  You will be given one of the following:   A medicine that numbs the area (local anesthesia).  A medicine that makes you go to sleep (general anesthesia).  A medicine injected into your spine that numbs your body below the waist (spinal anesthesia). Most often, several small cuts (incisions) are made in the knee. The arthroscope and instruments go into the incisions to repair the damage. The torn portion of the meniscus is removed.   AFTER THE PROCEDURE  You will be taken to the recovery area where your progress will be monitored. When you are awake, stable, and taking fluids without complications, you will be allowed to go home. This is usually the same day. A torn or stretched ligament (ligament sprain) Courtois take 6-8 weeks to heal.   It  takes about the 4-6 WEEKS if your surgeon removed a torn meniscus.  A repaired meniscus Paye require 6-12 weeks of recovery time.  A torn ligament needing reconstructive surgery Kotz take 6-12 months to heal fully.   This information is not intended to replace advice given to you by your health care provider. Make sure you discuss any questions you have with your health care provider. You have decided to proceed with operative arthroscopy of the knee. You have decided not to continue with nonoperative measures such as but not limited to oral medication, weight loss, activity modification, physical therapy, bracing, or injection.  We will perform operative arthroscopy of the knee. Some of the risks associated with arthroscopic surgery of the knee include but are not limited to Bleeding Infection Swelling Stiffness Blood clot Pain  If you're not comfortable with these risks and would like to continue with nonoperative treatment please let Dr. Madix Blowe know prior to your surgery.   Document Released: 08/09/2000 Document Revised: 08/17/2013 Document Reviewed: 01/08/2013 Elsevier Interactive Patient Education 2016 Elsevier Inc. You have decided to proceed with operative arthroscopy of the knee. You have decided not to continue with nonoperative measures such as but not limited to oral medication, weight loss, activity modification, physical therapy, bracing, or injection.  We will perform operative arthroscopy of the knee. Some of the risks associated with arthroscopic surgery of the knee include but are not limited to Bleeding Infection Swelling Stiffness Blood clot Pain  If you're not comfortable with these risks and would like to continue with nonoperative treatment please let Dr. Kioni Stahl   know prior to your surgery.   What You Need to Know About Prescription Opioid Pain Medicine        Please be advised. You are on a medication which is classified as an "opiod". The CDC the  [facility]  has recently advised all providers to advise patient's that these medications have certain risks which include but are not limited to:    drug intolerance  drug addiction  respiratory depression   respiratory failure  Death  Please keep these medications locked away. If you feel that you are becoming addicted to these medicines or you are having difficulties with these medications please alert your provider.   As your provider I will attempt to wean you off of these medications when you're severe acute pain has been taking care of. However, if we cannot wean you off of this medication you will be sent to a pain management center where they can better manage chronic pain   Opioids are powerful medicines that are used to treat moderate to severe pain. Opioids should be taken with the supervision of a trained health care provider. They should be taken for the shortest period of time as possible. This is because opioids can be addictive and the longer you take opioids, the greater your risk of addiction (opioid use disorder). What do opioids do? Opioids help reduce or eliminate pain. When used for short periods of time, they can help you:  Sleep better.  Do better in physical or occupational therapy.  Feel better in the first few days after an injury.  Recover from surgery. What kind of problems can opioids cause? Opioids can cause side effects, such as:  Constipation.  Nausea.  Vomiting.  Drowsiness.  Confusion.  Opioid use disorder.  Breathing difficulties (respiratory depression). Using opioid pain medicines for longer than 3 days increases your risk of these side effects. Taking opioid pain medicine for a long period of time can affect your ability to do daily tasks. It also puts you at risk for:  Car accidents.  Heart attack.  Overdose, which can sometimes lead to death. What can increase my risk for developing problems while taking  opioids? You Swader be at an especially high risk for problems while taking opioids if you:  Are over the age of 6.  Are pregnant.  Have kidney or liver disease.  Have certain mental health conditions, such as depression or anxiety.  Have a history of substance use disorder.  Have had an opioid overdose in the past. How do I stop taking opioids if I have been taking them for a long time? If you have been taking opioid medicine for more than a few weeks, you Weyenberg need to slowly stop taking them (taper). Tapering your use of opioids can decrease your chances of experiencing withdrawal symptoms, such as:  Abdominal pain and cramping.  Nausea.  Sweating.  Sleepiness.  Restlessness.  Uncontrollable shaking (tremors).  Cravings for the medicine. Do not attempt to taper your use of opioids on your own. Talk with your health care provider about how to do this. Your health care provider Campton prescribe a step-down schedule based on how much medicine you are taking and how long you have been taking it. What are the benefits of stopping the use of opioids? By switching from opioid pain medicine to non-opioid pain management options, you will decrease your risk of accidents and injuries associated with long-term opioid use. You will also be able to:  Monitor  your pain more accurately and know when to seek medical care if it is not improving.  Decrease risk to others around you. Having opioids in the home increases the risk for accidental or intentional use or overdose by others. How can I treat pain without opioids? Pain can be managed with many types of alternative treatments. Ask your health care provider to refer you to one or more specialists who can help you manage pain through:  Physical or occupational therapy.  Counseling (cognitive-behavioral therapy).  Good nutrition.  Biofeedback.  Massage.  Meditation.  Non-opioid medicine.  Following a gentle exercise program. Where  can I get support? If you have been taking opioids for a long time, you Swoboda benefit from receiving support for quitting from a local support group or counselor. Ask your health care provider for a referral to these resources in your area. When should I seek medical care? Seek medical care right away if you are taking opioids and you experience any of the following:  Difficulty breathing.  Breathing that is more shallow or slower than normal.  A very slow heartbeat (pulse).  Severe confusion.  Unconsciousness.  Sleepiness.  Difficulty waking from sleep.  Slurred speech.  Nausea and vomiting.  Cold, clammy skin.  Blue lips or fingernails.  Limpness.  Abnormally small pupils. If you think that you or someone else Warehime have taken too much of an opioid medicine, get medical help right away. Do not wait to see if the symptoms go away on their own. Call your local emergency services (911 in the [place] or call the hotline of the [facility] (954) 294-2453 in the [place].).  Where can I get more information? To learn more about opioid medicines, visit the Centers for Disease Control and Prevention web site Opioid Basics at https://keller-santana.com/. Summary  Opioid medicines can help you manage moderate-to-severe pain for a short period of time.  Taking opioid pain medicine for a long period of time puts you at risk for unintentional accidents, injury, and even death.  If you think that you or someone else Stukey have taken too much of an opioid, get medical help right away. This information is not intended to replace advice given to you by your health care provider. Make sure you discuss any questions you have with your health care provider. Document Released: 09/08/2015 Document Revised: 04/05/2016 Document Reviewed: 03/24/2015 Elsevier Interactive Patient Education  2017 [employer].

## 2017-05-22 ENCOUNTER — Encounter: Payer: Self-pay | Admitting: *Deleted

## 2017-05-22 NOTE — Progress Notes (Signed)
No pre-cert required for surgery [facility] CPT 29880 scheduled for 05/29/17. Patient notified of pre-op appointment date and time.

## 2017-05-26 NOTE — Patient Instructions (Signed)
Victor [name]  05/26/2017     @PREFPERIOPPHARMACY @   Your procedure is scheduled on 05/29/2017.  Report to [facility] at 8:50 A.M.  Call this number if you have problems the morning of surgery:  [phone]   Remember:  Do not eat food or drink liquids after midnight.  Take these medicines the morning of surgery with A SIP OF WATER : Vicodin   Do not wear jewelry, make-up or nail polish.  Do not wear lotions, powders, or perfumes, or deoderant.  Do not shave 48 hours prior to surgery.  Men States shave face and neck.  Do not bring valuables to the hospital.  [facility] is not responsible for any belongings or valuables.  Contacts, dentures or bridgework Parfitt not be worn into surgery.  Leave your suitcase in the car.  After surgery it Mandelbaum be brought to your room.  For patients admitted to the hospital, discharge time will be determined by your treatment team.  Patients discharged the day of surgery will not be allowed to drive home.   Name and phone number of your driver:   family Special instructions:  n/a  Please read over the following fact sheets that you were given. Care and Recovery After Surgery    Knee Arthroscopy Knee arthroscopy is a surgical procedure that is used to examine the inside of your knee joint and repair any damage. The surgeon puts a small, lighted instrument with a camera on the tip (arthroscope) through a small incision in your knee. The camera sends pictures to a monitor in the operating room. Your surgeon uses those pictures to guide the surgical instruments through other incisions to the area of damage. Knee arthroscopy can be used to treat many types of knee problems. It Gracia be used:  To repair a torn ligament.  To repair or remove damaged tissue.  To remove a fluid-filled sac (cyst) from your knee.  Tell a health care provider about:  Any allergies you have.  All medicines you are taking, including vitamins, herbs, eye drops, creams, and  over-the-counter medicines.  Any problems you or family members have had with anesthetic medicines.  Any blood disorders you have.  Any surgeries you have had.  Any medical conditions you have. What are the risks? Generally, this is a safe procedure. However, problems Croston occur, including:  Infection.  Bleeding.  Damage to blood vessels, nerves, or structures of your knee.  A blood clot that forms in your leg and travels to your lung.  Failure to relieve symptoms.  What happens before the procedure?  Ask your health care provider about: ? Changing or stopping your regular medicines. This is especially important if you are taking diabetes medicines or blood thinners. ? Taking medicines such as aspirin and ibuprofen. These medicines can thin your blood. Do not take these medicines before your procedure if your health care provider instructs you not to.  Follow your health care provider's instructions about eating or drinking restrictions.  Plan to have someone take you home after the procedure.  If you go home right after the procedure, plan to have someone with you for 24 hours.  Do not drink alcohol unless your health care provider says that you can.  Do not use any tobacco products, including cigarettes, chewing tobacco, or electronic cigarettes unless your health care provider says that you can. If you need help quitting, ask your health care provider.  You Gatling have a physical exam. What happens during  the procedure?  An IV tube will be inserted into one of your veins.  You will be given one or more of the following: ? A medicine that helps you relax (sedative). ? A medicine that numbs the area (local anesthetic). ? A medicine that makes you fall asleep (general anesthetic). ? A medicine that is injected into your spine that numbs the area below and slightly above the injection site (spinal anesthetic). ? A medicine that is injected into an area of your body that  numbs everything below the injection site (regional anesthetic).  A cuff Thornley be placed around your upper leg to slow bleeding during the procedure.  The surgeon will make a small number of incisions around your knee.  Your knee joint will be flushed and filled with a germ-free (sterile) solution.  The arthroscope will be passed through an incision into your knee joint.  More instruments will be passed through other incisions to repair your knee as needed.  The fluid will be removed from your knee.  The incisions will be closed with adhesive strips or stitches (sutures).  A bandage (dressing) will be placed over your knee. The procedure Dominique vary among health care providers and hospitals. What happens after the procedure?  Your blood pressure, heart rate, breathing rate and blood oxygen level will be monitored often until the medicines you were given have worn off.  You Gunnarson be given medicine for pain.  You Albo get crutches to help you walk without using your knee to support your body weight.  You Yassin have to wear compression stockings. These stocking help to prevent blood clots and reduce swelling in your legs. This information is not intended to replace advice given to you by your health care provider. Make sure you discuss any questions you have with your health care provider. Document Released: 08/09/2000 Document Revised: 01/18/2016 Document Reviewed: 08/08/2014 Elsevier Interactive Patient Education  2017 [employer].

## 2017-05-27 ENCOUNTER — Encounter (HOSPITAL_COMMUNITY): Payer: Self-pay

## 2017-05-27 ENCOUNTER — Encounter (HOSPITAL_COMMUNITY)
Admission: RE | Admit: 2017-05-27 | Discharge: 2017-05-27 | Disposition: A | Discharge status: Home / Self Care | Payer: Medicaid Other | Source: Ambulatory Visit | Attending: Orthopedic Surgery | Admitting: Orthopedic Surgery

## 2017-05-27 DIAGNOSIS — Z01812 Encounter for preprocedural laboratory examination: Secondary | ICD-10-CM | POA: Diagnosis present

## 2017-05-27 HISTORY — DX: Gastro-esophageal reflux disease without esophagitis: K21.9

## 2017-05-27 LAB — BASIC METABOLIC PANEL
Anion gap: 9 (ref 5–15)
BUN: 13 mg/dL (ref 6–20)
CHLORIDE: 99 mmol/L — AB (ref 101–111)
CO2: 27 mmol/L (ref 22–32)
Calcium: 9.1 mg/dL (ref 8.9–10.3)
Creatinine, Ser: 1.04 mg/dL (ref 0.61–1.24)
GFR calc Af Amer: >60 mL/min (ref >60)
GFR calc non Af Amer: >60 mL/min (ref >60)
GLUCOSE: 83 mg/dL (ref 65–99)
POTASSIUM: 3.8 mmol/L (ref 3.5–5.1)
Sodium: 135 mmol/L (ref 135–145)

## 2017-05-27 LAB — CBC
HEMATOCRIT: 44.4 % (ref 39.0–52.0)
HEMOGLOBIN: 15.3 g/dL (ref 13.0–17.0)
MCH: 30.3 pg (ref 26.0–34.0)
MCHC: 34.5 g/dL (ref 30.0–36.0)
MCV: 87.9 fL (ref 78.0–100.0)
Platelets: 228 10*3/uL (ref 150–400)
RBC: 5.05 MIL/uL (ref 4.22–5.81)
RDW: 13.3 % (ref 11.5–15.5)
WBC: 9.3 10*3/uL (ref 4.0–10.5)

## 2017-05-27 LAB — SURGICAL PCR SCREEN
MRSA, PCR: NEGATIVE
Staphylococcus aureus: NEGATIVE

## 2017-05-28 NOTE — H&P (Signed)
PREOP SURGERY CONSUL//OFFICE VISIT          Chief Complaint  Patient presents with  . Follow-up      Right knee, disuss surgery      41 year old male percent for preop evaluation for possible surgery right knee   The patient indicates that he injured his knee in the late 96s playing basketball. He sustained a noncontact knee injury severe pop swelling. He took 3-4 months to get better and since that time has had frequent giving way and popping episodes especially with pivoting.   He reinjured himself playing softball. Again noncontact injury. Pain medial and lateral joint swelling decreased range of motion especially extension. He was seen by Dr. Luna Glasgow eventually had an MRI of the knee at no clot [facility].   The imaging report was read out as a torn lateral meniscus torn medial meniscus with an intact anterior cruciate ligament and mild chondromalacia of the lateral compartment.   However, I do not see any anterior cruciate ligament tissue I do see the medial meniscus on questioning if there is a lateral meniscal tear all.   I made the patient aware that I have had some difficulty with these images with the report as well as the quality of the film         Review of Systems  Musculoskeletal: Positive for joint pain.  All other systems reviewed and are negative.           Past Medical History:  Diagnosis Date  . History of stomach ulcers             Past Surgical History:  Procedure Laterality Date  . APPENDECTOMY               Family History  Problem Relation Age of Onset  . Diabetes Mother    . Heart failure Mother    . Cancer Mother    . Cancer Father           Social History  Substance Use Topics  . Smoking status: Current Every Day Smoker      Packs/day: 0.50      Types: Cigarettes  . Smokeless tobacco: Never Used  . Alcohol use No      BP (!) 137/96   Pulse 76   Ht 5' 10.25" (1.784 m)   Wt 248 lb (112.5 kg)   BMI 35.33 kg/m    Physical  Exam  Constitutional: He is oriented to person, place, and time. He appears well-developed and well-nourished.  Vital signs have been reviewed and are stable. Gen. appearance the patient is well-developed and well-nourished with normal grooming and hygiene.   Musculoskeletal:       Right knee: Medial joint line and lateral joint line tenderness noted.  GAIT IS abnormal. He has a noticeable limp. His stance phases short his stride length is short his cadence is slow  Neurological: He is alert and oriented to person, place, and time.  Skin: Skin is warm and dry. No erythema.  Psychiatric: He has a normal mood and affect.  Vitals reviewed.     Left Knee Exam    Tenderness  None   Range of Motion  Normal left knee ROM   Muscle Strength  Normal left knee strength   Tests  Lachman:  Anterior - 2+    Posterior - n/t Drawer:       Anterior - Trace     Posterior - Negative Pivot Shift: 1+   Right  Knee Exam    Tenderness  The patient is experiencing tenderness in the medial joint line, lateral joint line.   Range of Motion  Extension: 10 Flexion:     130   Tests  McMurrays:  Medial - Positive       Lachman:  Anterior - Trace     Drawer:       Anterior - Negative    Posterior - Negative   Comments:  Normal neurovascular exam               Encounter Diagnoses  Name Primary?  . Other old tear of lateral meniscus of right knee    . Cigarette nicotine dependence without complication    . Acute medial meniscus tear of right knee, subsequent encounter Yes        PLAN:    I have made [name] aware that he has Medicaid and will not be of 2 physical therapy more than 3 visits. His Medicaid into an October. He understands that he will not have a normal knee after this surgery.   He also understands the risks of further knee surgery in the future. 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 Alternative treatment has also been reviewed, questions were encouraged and answered. The postoperative plan is also been reviewed.

## 2017-05-29 ENCOUNTER — Ambulatory Visit (HOSPITAL_COMMUNITY)
Admission: RE | Admit: 2017-05-29 | Discharge: 2017-05-29 | Disposition: A | Discharge status: Home / Self Care | Payer: Medicaid Other | Source: Ambulatory Visit | Attending: Orthopedic Surgery | Admitting: Orthopedic Surgery

## 2017-05-29 ENCOUNTER — Ambulatory Visit (HOSPITAL_COMMUNITY): Payer: Medicaid Other | Admitting: Anesthesiology

## 2017-05-29 ENCOUNTER — Encounter (HOSPITAL_COMMUNITY)
Admission: RE | Disposition: A | Discharge status: Home / Self Care | Payer: Self-pay | Source: Ambulatory Visit | Attending: Orthopedic Surgery

## 2017-05-29 DIAGNOSIS — M2241 Chondromalacia patellae, right knee: Secondary | ICD-10-CM

## 2017-05-29 DIAGNOSIS — Y9364 Activity, baseball: Secondary | ICD-10-CM | POA: Insufficient documentation

## 2017-05-29 DIAGNOSIS — K219 Gastro-esophageal reflux disease without esophagitis: Secondary | ICD-10-CM | POA: Diagnosis not present

## 2017-05-29 DIAGNOSIS — F17210 Nicotine dependence, cigarettes, uncomplicated: Secondary | ICD-10-CM | POA: Insufficient documentation

## 2017-05-29 DIAGNOSIS — M1711 Unilateral primary osteoarthritis, right knee: Secondary | ICD-10-CM | POA: Insufficient documentation

## 2017-05-29 DIAGNOSIS — S83511A Sprain of anterior cruciate ligament of right knee, initial encounter: Secondary | ICD-10-CM | POA: Diagnosis not present

## 2017-05-29 DIAGNOSIS — S83241A Other tear of medial meniscus, current injury, right knee, initial encounter: Secondary | ICD-10-CM | POA: Diagnosis not present

## 2017-05-29 DIAGNOSIS — M23251 Derangement of posterior horn of lateral meniscus due to old tear or injury, right knee: Secondary | ICD-10-CM | POA: Insufficient documentation

## 2017-05-29 DIAGNOSIS — S83511D Sprain of anterior cruciate ligament of right knee, subsequent encounter: Secondary | ICD-10-CM | POA: Diagnosis not present

## 2017-05-29 DIAGNOSIS — M94261 Chondromalacia, right knee: Secondary | ICD-10-CM | POA: Diagnosis not present

## 2017-05-29 DIAGNOSIS — M23321 Other meniscus derangements, posterior horn of medial meniscus, right knee: Secondary | ICD-10-CM | POA: Diagnosis not present

## 2017-05-29 DIAGNOSIS — S83271D Complex tear of lateral meniscus, current injury, right knee, subsequent encounter: Secondary | ICD-10-CM

## 2017-05-29 DIAGNOSIS — S83271A Complex tear of lateral meniscus, current injury, right knee, initial encounter: Secondary | ICD-10-CM

## 2017-05-29 DIAGNOSIS — S83241D Other tear of medial meniscus, current injury, right knee, subsequent encounter: Secondary | ICD-10-CM | POA: Diagnosis not present

## 2017-05-29 DIAGNOSIS — Z9889 Other specified postprocedural states: Secondary | ICD-10-CM

## 2017-05-29 HISTORY — PX: KNEE ARTHROSCOPY WITH MEDIAL MENISECTOMY: SHX5651

## 2017-05-29 SURGERY — ARTHROSCOPY, KNEE, WITH MEDIAL MENISCECTOMY
Anesthesia: General | Site: Knee | Laterality: Right

## 2017-05-29 MED ORDER — BUPIVACAINE-EPINEPHRINE (PF) 0.5% -1:200000 IJ SOLN
INTRAMUSCULAR | Status: AC
Start: 2017-05-29 — End: ?
  Filled 2017-05-29: qty 60

## 2017-05-29 MED ORDER — IBUPROFEN 400 MG PO TABS
400.0000 mg | ORAL_TABLET | Freq: Once | ORAL | Status: AC
  Administered 2017-05-29: 400 mg via ORAL

## 2017-05-29 MED ORDER — MIDAZOLAM HCL 5 MG/5ML IJ SOLN
INTRAMUSCULAR | Status: DC | PRN
  Administered 2017-05-29: 2 mg via INTRAVENOUS

## 2017-05-29 MED ORDER — SODIUM CHLORIDE 0.9 % IR SOLN
Status: DC | PRN
  Administered 2017-05-29 (×4): 3000 mL

## 2017-05-29 MED ORDER — LABETALOL HCL 5 MG/ML IV SOLN
INTRAVENOUS | Status: AC
  Filled 2017-05-29: qty 4

## 2017-05-29 MED ORDER — FENTANYL CITRATE (PF) 100 MCG/2ML IJ SOLN
INTRAMUSCULAR | Status: AC
  Filled 2017-05-29: qty 2

## 2017-05-29 MED ORDER — GLYCOPYRROLATE 0.2 MG/ML IJ SOLN
INTRAMUSCULAR | Status: AC
  Filled 2017-05-29: qty 1

## 2017-05-29 MED ORDER — MIDAZOLAM HCL 2 MG/2ML IJ SOLN
INTRAMUSCULAR | Status: AC
  Filled 2017-05-29: qty 2

## 2017-05-29 MED ORDER — CHLORHEXIDINE GLUCONATE 4 % EX LIQD: 60.0000 mL | Freq: Once | CUTANEOUS | Status: DC

## 2017-05-29 MED ORDER — CEFAZOLIN SODIUM-DEXTROSE 2-4 GM/100ML-% IV SOLN
2.0000 g | INTRAVENOUS | Status: AC
  Administered 2017-05-29: 2 g via INTRAVENOUS

## 2017-05-29 MED ORDER — HYDROCODONE-ACETAMINOPHEN 10-325 MG PO TABS: 1.0000 | ORAL_TABLET | ORAL | 0 refills | Status: DC | PRN

## 2017-05-29 MED ORDER — ONDANSETRON HCL 4 MG/2ML IJ SOLN
INTRAMUSCULAR | Status: AC
  Filled 2017-05-29: qty 2

## 2017-05-29 MED ORDER — OXYCODONE-ACETAMINOPHEN 5-325 MG PO TABS: 1.0000 | ORAL_TABLET | ORAL | Status: DC | PRN

## 2017-05-29 MED ORDER — GLYCOPYRROLATE 0.2 MG/ML IJ SOLN
0.2000 mg | Freq: Once | INTRAMUSCULAR | Status: AC
  Administered 2017-05-29: 0.2 mg via INTRAVENOUS

## 2017-05-29 MED ORDER — DEXAMETHASONE SODIUM PHOSPHATE 4 MG/ML IJ SOLN
4.0000 mg | Freq: Once | INTRAMUSCULAR | Status: AC
  Administered 2017-05-29: 4 mg via INTRAVENOUS

## 2017-05-29 MED ORDER — ONDANSETRON HCL 4 MG/2ML IJ SOLN
4.0000 mg | Freq: Once | INTRAMUSCULAR | Status: AC
  Administered 2017-05-29: 4 mg via INTRAVENOUS

## 2017-05-29 MED ORDER — KETOROLAC TROMETHAMINE 30 MG/ML IJ SOLN
INTRAMUSCULAR | Status: AC
  Filled 2017-05-29: qty 1

## 2017-05-29 MED ORDER — PROPOFOL 10 MG/ML IV BOLUS
INTRAVENOUS | Status: AC
  Filled 2017-05-29: qty 20

## 2017-05-29 MED ORDER — HYDROCODONE-ACETAMINOPHEN 5-325 MG PO TABS
ORAL_TABLET | ORAL | Status: AC
  Filled 2017-05-29: qty 1

## 2017-05-29 MED ORDER — MIDAZOLAM HCL 2 MG/2ML IJ SOLN
1.0000 mg | INTRAMUSCULAR | Status: AC
  Administered 2017-05-29: 2 mg via INTRAVENOUS

## 2017-05-29 MED ORDER — LIDOCAINE HCL (PF) 1 % IJ SOLN
INTRAMUSCULAR | Status: AC
  Filled 2017-05-29: qty 5

## 2017-05-29 MED ORDER — KETOROLAC TROMETHAMINE 30 MG/ML IJ SOLN
30.0000 mg | Freq: Once | INTRAMUSCULAR | Status: AC
  Administered 2017-05-29: 30 mg via INTRAVENOUS

## 2017-05-29 MED ORDER — POVIDONE-IODINE 10 % EX SWAB: 2.0000 "application " | Freq: Once | CUTANEOUS | Status: DC

## 2017-05-29 MED ORDER — LACTATED RINGERS IV SOLN
INTRAVENOUS | Status: DC
  Administered 2017-05-29: 1000 mL via INTRAVENOUS

## 2017-05-29 MED ORDER — FENTANYL CITRATE (PF) 100 MCG/2ML IJ SOLN
INTRAMUSCULAR | Status: DC | PRN
Start: 2017-05-29 — End: 2017-05-29
  Administered 2017-05-29 (×2): 50 ug via INTRAVENOUS

## 2017-05-29 MED ORDER — PROMETHAZINE HCL 12.5 MG PO TABS: 12.5000 mg | ORAL_TABLET | Freq: Four times a day (QID) | ORAL | 0 refills | Status: DC | PRN

## 2017-05-29 MED ORDER — SEVOFLURANE IN SOLN
RESPIRATORY_TRACT | Status: AC
  Filled 2017-05-29: qty 250

## 2017-05-29 MED ORDER — LIDOCAINE HCL 1 % IJ SOLN
INTRAMUSCULAR | Status: DC | PRN
  Administered 2017-05-29: 30 mg via INTRADERMAL

## 2017-05-29 MED ORDER — BUPIVACAINE-EPINEPHRINE (PF) 0.5% -1:200000 IJ SOLN
INTRAMUSCULAR | Status: DC | PRN
  Administered 2017-05-29: 60 mL via PERINEURAL

## 2017-05-29 MED ORDER — PROPOFOL 10 MG/ML IV BOLUS
INTRAVENOUS | Status: DC | PRN
  Administered 2017-05-29: 200 mg via INTRAVENOUS

## 2017-05-29 MED ORDER — LABETALOL HCL 5 MG/ML IV SOLN
10.0000 mg | INTRAVENOUS | Status: AC | PRN
  Administered 2017-05-29 (×2): 10 mg via INTRAVENOUS

## 2017-05-29 MED ORDER — DEXAMETHASONE SODIUM PHOSPHATE 4 MG/ML IJ SOLN
INTRAMUSCULAR | Status: AC
  Filled 2017-05-29: qty 1

## 2017-05-29 MED ORDER — CEFAZOLIN SODIUM-DEXTROSE 2-4 GM/100ML-% IV SOLN
INTRAVENOUS | Status: AC
  Filled 2017-05-29: qty 100

## 2017-05-29 MED ORDER — FENTANYL CITRATE (PF) 100 MCG/2ML IJ SOLN
25.0000 ug | INTRAMUSCULAR | Status: DC | PRN
  Administered 2017-05-29 (×2): 25 ug via INTRAVENOUS
  Filled 2017-05-29: qty 2

## 2017-05-29 SURGICAL SUPPLY — 55 items
ARTHROWAND PARAGON T2 (SURGICAL WAND)
BAG HAMPER (MISCELLANEOUS) ×3 IMPLANT
BANDAGE ELASTIC 6 LF NS (GAUZE/BANDAGES/DRESSINGS) ×3 IMPLANT
BLADE AGGRESSIVE PLUS 4.0 (BLADE) ×3 IMPLANT
BLADE SURG SZ11 CARB STEEL (BLADE) ×3 IMPLANT
BNDG CMPR MED 5X6 ELC HKLP NS (GAUZE/BANDAGES/DRESSINGS) ×1
CHLORAPREP W/TINT 26ML (MISCELLANEOUS) ×3 IMPLANT
CLOTH BEACON ORANGE TIMEOUT ST (SAFETY) ×3 IMPLANT
COOLER CRYO IC GRAV AND TUBE (ORTHOPEDIC SUPPLIES) ×3 IMPLANT
CUFF CRYO KNEE LG 20X31 COOLER (ORTHOPEDIC SUPPLIES) ×2 IMPLANT
CUFF TOURNIQUET SINGLE 34IN LL (TOURNIQUET CUFF) ×2 IMPLANT
CUTTER ANGLED DBL BITE 4.5 (BURR) IMPLANT
CUTTER TOMCAT 5.0MM (BURR) ×2 IMPLANT
DECANTER SPIKE VIAL GLASS SM (MISCELLANEOUS) ×6 IMPLANT
GAUZE SPONGE 4X4 12PLY STRL (GAUZE/BANDAGES/DRESSINGS) ×3 IMPLANT
GAUZE SPONGE 4X4 16PLY XRAY LF (GAUZE/BANDAGES/DRESSINGS) ×3 IMPLANT
GAUZE XEROFORM 5X9 LF (GAUZE/BANDAGES/DRESSINGS) ×3 IMPLANT
GLOVE BIOGEL PI IND STRL 7.0 (GLOVE) ×1 IMPLANT
GLOVE BIOGEL PI INDICATOR 7.0 (GLOVE) ×2
GLOVE SKINSENSE NS SZ8.0 LF (GLOVE) ×2
GLOVE SKINSENSE STRL SZ8.0 LF (GLOVE) ×1 IMPLANT
GLOVE SS N UNI LF 8.5 STRL (GLOVE) ×3 IMPLANT
GOWN STRL REUS W/ TWL LRG LVL3 (GOWN DISPOSABLE) ×1 IMPLANT
GOWN STRL REUS W/TWL LRG LVL3 (GOWN DISPOSABLE) ×3
GOWN STRL REUS W/TWL XL LVL3 (GOWN DISPOSABLE) ×3 IMPLANT
HLDR LEG FOAM (MISCELLANEOUS) ×1 IMPLANT
IV NS IRRIG 3000ML ARTHROMATIC (IV SOLUTION) ×10 IMPLANT
KIT BLADEGUARD II DBL (SET/KITS/TRAYS/PACK) ×3 IMPLANT
KIT ROOM TURNOVER AP CYSTO (KITS) ×3 IMPLANT
LEG HOLDER FOAM (MISCELLANEOUS) ×2
MANIFOLD NEPTUNE II (INSTRUMENTS) ×3 IMPLANT
MARKER SKIN DUAL TIP RULER LAB (MISCELLANEOUS) ×3 IMPLANT
NDL HYPO 18GX1.5 BLUNT FILL (NEEDLE) ×1 IMPLANT
NDL HYPO 21X1.5 SAFETY (NEEDLE) ×1 IMPLANT
NDL SPNL 18GX3.5 QUINCKE PK (NEEDLE) ×1 IMPLANT
NEEDLE HYPO 18GX1.5 BLUNT FILL (NEEDLE) ×3 IMPLANT
NEEDLE HYPO 21X1.5 SAFETY (NEEDLE) ×3 IMPLANT
NEEDLE SPNL 18GX3.5 QUINCKE PK (NEEDLE) ×3 IMPLANT
NS IRRIG 1000ML POUR BTL (IV SOLUTION) ×3 IMPLANT
PACK ARTHRO LIMB DRAPE STRL (MISCELLANEOUS) ×3 IMPLANT
PAD ABD 5X9 TENDERSORB (GAUZE/BANDAGES/DRESSINGS) ×3 IMPLANT
PAD ARMBOARD 7.5X6 YLW CONV (MISCELLANEOUS) ×3 IMPLANT
PADDING CAST COTTON 6X4 STRL (CAST SUPPLIES) ×3 IMPLANT
PADDING WEBRIL 6 STERILE (GAUZE/BANDAGES/DRESSINGS) ×2 IMPLANT
PROBE BIPOLAR 50 DEGREE SUCT (MISCELLANEOUS) ×2 IMPLANT
PROBE BIPOLAR ATHRO 135MM 90D (MISCELLANEOUS) IMPLANT
SET ARTHROSCOPY INST (INSTRUMENTS) ×3 IMPLANT
SET ARTHROSCOPY PUMP TUBE (IRRIGATION / IRRIGATOR) ×3 IMPLANT
SET BASIN LINEN APH (SET/KITS/TRAYS/PACK) ×3 IMPLANT
SUT ETHILON 3 0 FSL (SUTURE) ×3 IMPLANT
SYR 30ML LL (SYRINGE) ×3 IMPLANT
SYRINGE 10CC LL (SYRINGE) ×3 IMPLANT
TUBE CONNECTING 12'X1/4 (SUCTIONS) ×3
TUBE CONNECTING 12X1/4 (SUCTIONS) ×6 IMPLANT
WAND ARTHRO PARAGON T2 (SURGICAL WAND) IMPLANT

## 2017-05-29 NOTE — Discharge Instructions (Signed)
Knee Ligament Injury, Arthroscopy Arthroscopy is a surgical technique in which your health care provider examines your knee through a small, pencil-sized telescope (arthroscope). Often, repairs to injured ligaments can be done with instruments in the arthroscope. Arthroscopy is less invasive than open-knee surgery. Tell a health care provider about:  Any allergies you have.  All medicines you are taking, including vitamins, herbs, eye drops, creams, and over-the-counter medicines.  Any problems you or family members have had with anesthetic medicines.  Any blood disorders you have.  Any surgeries you have had.  Any medical conditions you have. What are the risks? Generally, this is a safe procedure. However, as with any procedure, problems can occur. Possible problems include:  Infection.  Bleeding.  Stiffness.  What happens before the procedure?  Ask your health care provider about changing or stopping any regular medicines. Avoid taking aspirin or blood thinners as directed by your health care provider.  Do not eat or drink anything after midnight the night before surgery.  If you smoke, do not smoke for at least 2 weeks before your surgery.  Do not drink alcohol starting the day before your surgery.  Let your health care provider know if you develop a cold or any infection before your surgery.  Arrange for someone to drive you home after the surgery or after your hospital stay. Also arrange for someone to help you with activities during recovery. What happens during the procedure?  Small monitors will be put on your body. They are used to check your heart, blood pressure, and oxygen levels.  An IV access tube will be put into one of your veins. Medicine will be able to flow directly into your body through this IV tube.  You might be given a medicine to help you relax (sedative).  You will be given a medicine that makes you go to sleep (general anesthetic), and a  breathing tube will be placed into your lungs during the procedure.  Several small incisions are made in your knee. Saline fluid is placed into one of the incisions to expand the knee and clear away any blood in the knee.  Your health care provider will insert the arthroscope to examine the injured knee.  During arthroscopy, your health care provider Willig find a partial or complete tear in a ligament.  Tools can be inserted through the other incisions to repair the injured ligaments.  The incisions are then closed with absorbable stitches and covered with dressings. What happens after the procedure?  You will be taken to the recovery area where you will be monitored.  When you are awake, stable, and taking fluids without problems, you will be allowed to go home. This information is not intended to replace advice given to you by your health care provider. Make sure you discuss any questions you have with your health care provider. Document Released: 08/09/2000 Document Revised: 01/18/2016 Document Reviewed: 03/24/2013 Elsevier Interactive Patient Education  2017 [place].  Knee Arthroscopy, Care After Refer to this sheet in the next few weeks. These instructions provide you with information about caring for yourself after your procedure. Your health care provider Mira also give you more specific instructions. Your treatment has been planned according to current medical practices, but problems sometimes occur. Call your health care provider if you have any problems or questions after your procedure. What can I expect after the procedure? After the procedure, it is common to have:  Soreness.  Pain.  Follow these instructions at home:  Bathing  Do not take baths, swim, or use a hot tub until your health care provider approves. Incision care  There are many different ways to close and cover an incision, including stitches, skin glue, and adhesive strips. Follow your health care  providers instructions about: ? Incision care. ? Bandage (dressing) changes and removal. ? Incision closure removal.  Check your incision area every day for signs of infection. Watch for: ? Redness, swelling, or pain. ? Fluid, blood, or pus. Activity  Avoid strenuous activities for as long as directed by your health care provider.  Return to your normal activities as directed by your health care provider. Ask your health care provider what activities are safe for you.  Perform range-of-motion exercises only as directed by your health care provider.  Do not lift anything that is heavier than 10 lb (4.5 kg).  Do not drive or operate heavy machinery while taking pain medicine.  If you were given crutches, use them as directed by your health care provider. Managing pain, stiffness, and swelling  If directed, apply ice to the injured area: ? Put ice in a plastic bag. ? Place a towel between your skin and the bag. ? Leave the ice on for 20 minutes, 2-3 times per day.  Raise the injured area above the level of your heart while you are sitting or lying down as directed by your health care provider. General instructions  Keep all follow-up visits as directed by your health care provider. This is important.  Take medicines only as directed by your health care provider.  Do not use any tobacco products, including cigarettes, chewing tobacco, or electronic cigarettes. If you need help quitting, ask your health care provider.  If you were given compression stockings, wear them as directed by your health care provider. These stockings help prevent blood clots and reduce swelling in your legs. Contact a health care provider if:  You have severe pain with any movement of your knee.  You notice a bad smell coming from the incision or dressing.  You have redness, swelling, or pain at the site of your incision.  You have fluid, blood, or pus coming from your incision. Get help right away  if:  You develop a rash.  You have a fever.  You have difficulty breathing or have shortness of breath.  You develop pain in your calves or in the back of your knee.  You develop chest pain.  You develop numbness or tingling in your leg or foot. This information is not intended to replace advice given to you by your health care provider. Make sure you discuss any questions you have with your health care provider. Document Released: 03/01/2005 Document Revised: 01/12/2016 Document Reviewed: 08/08/2014 Elsevier Interactive Patient Education  2017 [place] Anesthesia, Adult General anesthesia is the use of medicines to make a person "go to sleep" (be unconscious) for a medical procedure. General anesthesia is often recommended when a procedure:  Is long.  Requires you to be still or in an unusual position.  Is major and can cause you to lose blood.  Is impossible to do without general anesthesia.  The medicines used for general anesthesia are called general anesthetics. In addition to making you sleep, the medicines:  Prevent pain.  Control your blood pressure.  Relax your muscles.  Tell a health care provider about:  Any allergies you have.  All medicines you are taking, including vitamins, herbs, eye drops, creams, and over-the-counter medicines.  Any problems you or family members have had with anesthetic medicines.  Types of anesthetics you have had in the past.  Any bleeding disorders you have.  Any surgeries you have had.  Any medical conditions you have.  Any history of heart or lung conditions, such as heart failure, sleep apnea, or chronic obstructive pulmonary disease (COPD).  Whether you are pregnant or [place] be pregnant.  Whether you use tobacco, alcohol, marijuana, or street drugs.  Any history of Armed forces logistics/support/administrative officer.  Any history of depression or anxiety. What are the risks? Generally, this is a safe procedure. However, problems Card  occur, including:  Allergic reaction to anesthetics.  Lung and heart problems.  Inhaling food or liquids from your stomach into your lungs (aspiration).  Injury to nerves.  Waking up during your procedure and being unable to move (rare).  Extreme agitation or a state of mental confusion (delirium) when you wake up from the anesthetic.  Air in the bloodstream, which can lead to stroke.  These problems are more likely to develop if you are having a major surgery or if you have an advanced medical condition. You can prevent some of these complications by answering all of your health care provider's questions thoroughly and by following all pre-procedure instructions. General anesthesia can cause side effects, including:  Nausea or vomiting  A sore throat from the breathing tube.  Feeling cold or shivery.  Feeling tired, washed out, or achy.  Sleepiness or drowsiness.  Confusion or agitation.  What happens before the procedure? Staying hydrated Follow instructions from your health care provider about hydration, which [place] include:  Up to 2 hours before the procedure - you Peters continue to drink clear liquids, such as water, clear fruit juice, black coffee, and plain tea.  Eating and drinking restrictions Follow instructions from your health care provider about eating and drinking, which Bernardini include:  8 hours before the procedure - stop eating heavy meals or foods such as meat, fried foods, or fatty foods.  6 hours before the procedure - stop eating light meals or foods, such as toast or cereal.  6 hours before the procedure - stop drinking milk or drinks that contain milk.  2 hours before the procedure - stop drinking clear liquids.  Medicines  Ask your health care provider about: ? Changing or stopping your regular medicines. This is especially important if you are taking diabetes medicines or blood thinners. ? Taking medicines such as aspirin and ibuprofen. These  medicines can thin your blood. Do not take these medicines before your procedure if your health care provider instructs you not to. ? Taking new dietary supplements or medicines. Do not take these during the week before your procedure unless your health care provider approves them.  If you are told to take a medicine or to continue taking a medicine on the day of the procedure, take the medicine with sips of water. General instructions   Ask if you will be going home the same day, the following day, or after a longer hospital stay. ? Plan to have someone take you home. ? Plan to have someone stay with you for the first 24 hours after you leave the hospital or clinic.  For 3-6 weeks before the procedure, try not to use any tobacco products, such as cigarettes, chewing tobacco, and e-cigarettes.  You Orgeron brush your teeth on the morning of the procedure, but make sure to spit out the toothpaste. What happens during the procedure?  You  will be given anesthetics through a mask and through an IV tube in one of your veins.  You [place] receive medicine to help you relax (sedative).  As soon as you are asleep, a breathing tube Libman be used to help you breathe.  An anesthesia specialist will stay with you throughout the procedure. He or she will help keep you comfortable and safe by continuing to give you medicines and adjusting the amount of medicine that you get. He or she will also watch your blood pressure, pulse, and oxygen levels to make sure that the anesthetics do not cause any problems.  If a breathing tube was used to help you breathe, it will be removed before you wake up. The procedure Camposano vary among health care providers and hospitals. What happens after the procedure?  You will wake up, often slowly, after the procedure is complete, usually in a recovery area.  Your blood pressure, heart rate, breathing rate, and blood oxygen level will be monitored until the medicines you were given  have worn off.  You S[place] be given medicine to help you calm down if you feel anxious or agitated.  If you will be going home the same day, your health care provider Tunnell check to make sure you can stand, drink, and urinate.  Your health care providers will treat your pain and side effects before you go home.  Do not drive for 24 hours if you received a sedative.  You Salmi: ? Feel nauseous and vomit. ? Have a sore throat. ? Have mental slowness. ? Feel cold or shivery. ? Feel sleepy. ? Feel tired. ? Feel sore or achy, even in parts of your body where you did not have surgery. This information is not intended to replace advice given to you by your health care provider. Make sure you discuss any questions you have with your health care provider. Document Released: 11/19/2007 Document Revised: 01/23/2016 Document Reviewed: 07/27/2015 Elsevier Interactive Patient Education  [employer].

## 2017-05-29 NOTE — Anesthesia Preprocedure Evaluation (Signed)
Anesthesia Evaluation  Patient identified by MRN, date of birth, ID band Patient awake    Reviewed: Allergy & Precautions, NPO status , Patient's Chart, lab work & pertinent test results  Airway Mallampati: I  TM Distance: >3 FB Neck ROM: Full    Dental  (+) Teeth Intact   Pulmonary Current Smoker,    breath sounds clear to auscultation       Cardiovascular negative cardio ROS   Rhythm:Regular Rate:Normal     Neuro/Psych negative neurological ROS  negative psych ROS   GI/Hepatic Neg liver ROS, PUD, GERD  Medicated and Controlled,  Endo/Other  negative endocrine ROS  Renal/GU negative Renal ROS     Musculoskeletal   Abdominal   Peds  Hematology negative hematology ROS (+)   Anesthesia Other Findings   Reproductive/Obstetrics                             Anesthesia Physical Anesthesia Plan  ASA: II  Anesthesia Plan: General   Post-op Pain Management:    Induction: Intravenous  PONV Risk Score and Plan:   Airway Management Planned: LMA  Additional Equipment:   Intra-op Plan:   Post-operative Plan: Extubation in OR  Informed Consent: I have reviewed the patients History and Physical, chart, labs and discussed the procedure including the risks, benefits and alternatives for the proposed anesthesia with the patient or authorized representative who has indicated his/her understanding and acceptance.     Plan Discussed with:   Anesthesia Plan Comments:         Anesthesia Quick Evaluation

## 2017-05-29 NOTE — Brief Op Note (Signed)
05/29/2017  10:14 AM  PATIENT:  Victor [name]  41 y.o. male  PRE-OPERATIVE DIAGNOSIS:  Torn medial meniscus torn lateral meniscus anterior cruciate ligament deficiency right knee  POST-OPERATIVE DIAGNOSIS:  Torn medial meniscus, torn lateral meniscus, torn ACL, arthritis  Operative findings  I did an exam under anesthesia. There was a glide pivot shift and a 1+ Lachman test.  Intra-Op torn medial meniscus. Quite large tear extending from the posterior horn across the body.  Chondral surfaces of medial femoral condyle were preserved  The anterior cruciate ligament was torn and appeared to be scarred to the posterior cruciate ligament  The lateral meniscus was torn and there was a grade 2 depth grade 3 surface area lesion of the lateral femoral condyle. There was a small fissure in the trochlea. The patella had a grade 2 lesion of the medial facet.   PROCEDURE:  Procedure(s): KNEE ARTHROSCOPY WITH MEDIAL MENISECTOMY and lateral meniscectomy (Right)   Knee arthroscopy dictation  The patient was identified in the preoperative holding area using 2 approved identification mechanisms. The chart was reviewed and updated. The surgical site was confirmed as right knee and marked with an indelible marker.  The patient was taken to the operating room for anesthesia. After successful  general anesthesia, Ancef 2 g was used as IV antibiotics.  The patient was placed in the supine position with the (right) the operative extremity in an arthroscopic leg holder and the opposite extremity in a padded leg holder.  The timeout was executed.  A lateral portal was established with an 11 blade and the scope was introduced into the joint. A diagnostic arthroscopy was performed in circumferential manner examining the entire knee joint. A medial portal was established and the diagnostic arthroscopy was repeated using a probe to palpate intra-articular structures as they were encountered.    The MEDIAL   meniscus was resected using a duckbill forceps. The meniscal fragments were removed with a motorized shaver. The meniscus was balanced with a combination of a motorized shaver and a 50  wand until a stable rim was obtained.  THERE WAS A PIECE OF MENISCUS IN THE [place] WHICH WAS REMOVED WITH THE WAND AND THE SHAVER.  THE ANTERIOR CRUCIATE LIGAMENT STUMP WAS LAX.  Lateral meniscus had a tear extending from the posterior horn across the body of the meniscus and it was a fishmouth tear. We debrided both superior and inferior surfaces until a stable rim was reached. We used a duckbill forceps a shaver and a 50 wand  The arthroscopic pump was placed on the wash mode and any excess debris was removed from the joint using suction.  60 cc of Marcaine with epinephrine was injected through the arthroscope.  The portals were closed with 3-0 nylon suture.  A sterile bandage, Ace wrap and Cryo/Cuff was placed and the Cryo/Cuff was activated. The patient was taken to the recovery room in stable condition.   SURGEON:  Surgeon(s) and Role:    * [name], MD - Primary  PHYSICIAN ASSISTANT:   ASSISTANTS: none   ANESTHESIA:   general  EBL:  Total I/O In: 700 [I.V.:700] Out: 5 [Blood:5]  BLOOD ADMINISTERED:none  DRAINS: none   LOCAL MEDICATIONS USED:  MARCAINE     SPECIMEN:  No Specimen  DISPOSITION OF SPECIMEN:  N/A  COUNTS:  YES  TOURNIQUET:    DICTATION: .Dragon Dictation  PLAN OF CARE: Discharge to home after PACU  PATIENT DISPOSITION:  PACU - hemodynamically stable.   Delay  start of Pharmacological VTE agent (>24hrs) due to surgical blood loss or risk of bleeding: not applicable

## 2017-05-29 NOTE — Anesthesia Procedure Notes (Signed)
Procedure Name: LMA Insertion Date/Time: 05/29/2017 9:13 AM Performed by: Charmaine Downs Pre-anesthesia Checklist: Patient identified, Patient being monitored, Emergency Drugs available, Timeout performed and Suction available Patient Re-evaluated:Patient Re-evaluated prior to induction Oxygen Delivery Method: Circle System Utilized Preoxygenation: Pre-oxygenation with 100% oxygen Induction Type: IV induction Ventilation: Mask ventilation without difficulty LMA: LMA inserted LMA Size: 4.0 Number of attempts: 1 Placement Confirmation: positive ETCO2 and breath sounds checked- equal and bilateral Tube secured with: Tape Dental Injury: Teeth and Oropharynx as per pre-operative assessment

## 2017-05-29 NOTE — Interval H&P Note (Signed)
History and Physical Interval Note:  05/29/2017 8:46 AM  Victor [name]  has presented today for surgery, with the diagnosis of Torn medial meniscus torn lateral meniscus anterior cruciate ligament deficiency right knee  The various methods of treatment have been discussed with the patient and family. After consideration of risks, benefits and other options for treatment, the patient has consented to  Procedure(s): KNEE ARTHROSCOPY WITH MEDIAL MENISECTOMY and lateral meniscectomy (Right) as a surgical intervention .  The patient's history has been reviewed, patient examined, no change in status, stable for surgery.  I have reviewed the patient's chart and labs.  Questions were answered to the patient's satisfaction.     Arther Abbott

## 2017-05-29 NOTE — Op Note (Signed)
05/29/2017  10:14 AM  PATIENT:  Victor [name]  41 y.o. male  PRE-OPERATIVE DIAGNOSIS:  Torn medial meniscus torn lateral meniscus anterior cruciate ligament deficiency right knee  POST-OPERATIVE DIAGNOSIS:  Torn medial meniscus, torn lateral meniscus, torn ACL, arthritis  Operative findings  I did an exam under anesthesia. There was a glide pivot shift and a 1+ Lachman test.  Intra-Op torn medial meniscus. Quite large tear extending from the posterior horn across the body.  Chondral surfaces of medial femoral condyle were preserved  The anterior cruciate ligament was torn and appeared to be scarred to the posterior cruciate ligament  The lateral meniscus was torn and there was a grade 2 depth grade 3 surface area lesion of the lateral femoral condyle. There was a small fissure in the trochlea. The patella had a grade 2 lesion of the medial facet.   PROCEDURE:  Procedure(s): KNEE ARTHROSCOPY WITH MEDIAL MENISECTOMY and lateral meniscectomy (Right)  [id number]  Knee arthroscopy dictation  The patient was identified in the preoperative holding area using 2 approved identification mechanisms. The chart was reviewed and updated. The surgical site was confirmed as right knee and marked with an indelible marker.  The patient was taken to the operating room for anesthesia. After successful  general anesthesia, Ancef 2 g was used as IV antibiotics.  The patient was placed in the supine position with the (right) the operative extremity in an arthroscopic leg holder and the opposite extremity in a padded leg holder.  The timeout was executed.  A lateral portal was established with an 11 blade and the scope was introduced into the joint. A diagnostic arthroscopy was performed in circumferential manner examining the entire knee joint. A medial portal was established and the diagnostic arthroscopy was repeated using a probe to palpate intra-articular structures as they were encountered.    The  MEDIAL  meniscus was resected using a duckbill forceps. The meniscal fragments were removed with a motorized shaver. The meniscus was balanced with a combination of a motorized shaver and a 50  wand until a stable rim was obtained.  THERE WAS A PIECE OF MENISCUS IN THE [place] WHICH WAS REMOVED WITH THE WAND AND THE SHAVER.  THE ANTERIOR CRUCIATE LIGAMENT STUMP WAS LAX.  Lateral meniscus had a tear extending from the posterior horn across the body of the meniscus and it was a fishmouth tear. We debrided both superior and inferior surfaces until a stable rim was reached. We used a duckbill forceps a shaver and a 50 wand  The arthroscopic pump was placed on the wash mode and any excess debris was removed from the joint using suction.  60 cc of Marcaine with epinephrine was injected through the arthroscope.  The portals were closed with 3-0 nylon suture.  A sterile bandage, Ace wrap and Cryo/Cuff was placed and the Cryo/Cuff was activated. The patient was taken to the recovery room in stable condition.   SURGEON:  Surgeon(s) and Role:    * [name], MD - Primary  PHYSICIAN ASSISTANT:   ASSISTANTS: none   ANESTHESIA:   general  EBL:  Total I/O In: 700 [I.V.:700] Out: 5 [Blood:5]  BLOOD ADMINISTERED:none  DRAINS: none   LOCAL MEDICATIONS USED:  MARCAINE     SPECIMEN:  No Specimen  DISPOSITION OF SPECIMEN:  N/A  COUNTS:  YES  TOURNIQUET:    DICTATION: .Dragon Dictation  PLAN OF CARE: Discharge to home after PACU  PATIENT DISPOSITION:  PACU - hemodynamically stable.  Delay start of Pharmacological VTE agent (>24hrs) due to surgical blood loss or risk of bleeding: not applicable  

## 2017-05-29 NOTE — Anesthesia Postprocedure Evaluation (Signed)
Anesthesia Post Note  Patient: Victor [name]  Procedure(s) Performed: KNEE ARTHROSCOPY WITH MEDIAL MENISECTOMY and lateral meniscectomy (Right Knee)  Anesthesia Type: General Level of consciousness: awake and alert and patient cooperative Pain management: pain level controlled Vital Signs Assessment: post-procedure vital signs reviewed and stable Respiratory status: spontaneous breathing, nonlabored ventilation and respiratory function stable Cardiovascular status: blood pressure returned to baseline Postop Assessment: no apparent nausea or vomiting Anesthetic complications: no     Last Vitals:  Vitals:   05/29/17 1115 05/29/17 1121  BP: (!) 138/93 (!) 123/94  Pulse:  63  Resp:  18  Temp:    SpO2: 91% 91%    Last Pain:  Vitals:   05/29/17 1114  TempSrc:   PainSc: 6                  Kailany Dinunzio J

## 2017-05-29 NOTE — Transfer of Care (Signed)
Immediate Anesthesia Transfer of Care Note  Patient: Victor [name]  Procedure(s) Performed: KNEE ARTHROSCOPY WITH MEDIAL MENISECTOMY and lateral meniscectomy (Right Knee)  Patient Location: PACU  Anesthesia Type:General  Level of Consciousness: drowsy and patient cooperative  Airway & Oxygen Therapy: Patient Spontanous Breathing and Patient connected to face mask oxygen  Post-op Assessment: Report given to RN, Post -op Vital signs reviewed and stable and Patient moving all extremities  Post vital signs: Reviewed and stable  Last Vitals:  Vitals:   05/29/17 0835 05/29/17 0840  BP: 128/82   Resp: (!) 52 (!) 80  Temp:    SpO2: 93% 97%    Last Pain:  Vitals:   05/29/17 0756  TempSrc: Oral  PainSc: 4       Patients Stated Pain Goal: 7 (87/86/76 7209)  Complications: No apparent anesthesia complications

## 2017-05-30 ENCOUNTER — Encounter (HOSPITAL_COMMUNITY): Payer: Self-pay | Admitting: Orthopedic Surgery

## 2017-06-06 ENCOUNTER — Ambulatory Visit: Payer: Self-pay | Admitting: Orthopedic Surgery

## 2017-06-10 ENCOUNTER — Encounter: Payer: Self-pay | Admitting: Orthopedic Surgery

## 2017-06-10 ENCOUNTER — Ambulatory Visit (INDEPENDENT_AMBULATORY_CARE_PROVIDER_SITE_OTHER): Payer: Medicaid Other | Admitting: Orthopedic Surgery

## 2017-06-10 VITALS — BP 132/93 | HR 90 | Ht 70.0 in | Wt 247.0 lb

## 2017-06-10 DIAGNOSIS — Z9889 Other specified postprocedural states: Secondary | ICD-10-CM

## 2017-06-10 MED ORDER — HYDROCODONE-ACETAMINOPHEN 5-325 MG PO TABS: 1.0000 | ORAL_TABLET | Freq: Four times a day (QID) | ORAL | 0 refills | Status: DC | PRN

## 2017-06-10 MED ORDER — PROMETHAZINE HCL 12.5 MG PO TABS: 12.5000 mg | ORAL_TABLET | Freq: Four times a day (QID) | ORAL | 0 refills | Status: DC | PRN

## 2017-06-10 MED ORDER — IBUPROFEN 800 MG PO TABS: 800.0000 mg | ORAL_TABLET | Freq: Three times a day (TID) | ORAL | 5 refills | Status: DC

## 2017-06-10 NOTE — Progress Notes (Signed)
POST OP VISIT   Patient ID: Victor [name], male   DOB: 1976-02-27, 41 y.o.   MRN: [id number]  Chief Complaint  Patient presents with  . Post-op Follow-up    right knee date of surgery 05/29/17    Encounter Diagnosis  Name Primary?  . S/P right knee arthroscopy Yes   05/29/2017  10:14 AM  PATIENT:  Vasili Fok Mccollister  40 y.o. male  PRE-OPERATIVE DIAGNOSIS:  Torn medial meniscus torn lateral meniscus anterior cruciate ligament deficiency right knee  POST-OPERATIVE DIAGNOSIS:  Torn medial meniscus, torn lateral meniscus, torn ACL, arthritis  Operative findings  I did an exam under anesthesia. There was a glide pivot shift and a 1+ Lachman test.  Intra-Op torn medial meniscus. Quite large tear extending from the posterior horn across the body.  Chondral surfaces of medial femoral condyle were preserved  The anterior cruciate ligament was torn and appeared to be scarred to the posterior cruciate ligament  The lateral meniscus was torn and there was a grade 2 depth grade 3 surface area lesion of the lateral femoral condyle. There was a small fissure in the trochlea. The patella had a grade 2 lesion of the medial facet.   PROCEDURE:  Procedure(s): KNEE ARTHROSCOPY WITH MEDIAL MENISECTOMY and lateral meniscectomy (Right)   Knee arthroscopy dictation   The MEDIAL  meniscus was resected using a duckbill forceps. The meniscal fragments were removed with a motorized shaver. The meniscus was balanced with a combination of a motorized shaver and a 50  wand until a stable rim was obtained.  THERE WAS A PIECE OF MENISCUS IN THE [place] WHICH WAS REMOVED WITH THE WAND AND THE SHAVER.  THE ANTERIOR CRUCIATE LIGAMENT STUMP WAS LAX.  Lateral meniscus had a tear extending from the posterior horn across the body of the meniscus and it was a fishmouth tear. We debrided both superior and inferior surfaces until a stable rim was reached. We used a duckbill forceps a shaver and a 50 wand  Sutures were  taken out patient is off of his crutches has a light limp  I refilled his hydrocodone ibuprofen and promethazine  His knee flexion is 90 has a slight deficit in extension his knee looks good otherwise  Recommend home exercises with a pet pad  He wants an anterior cruciate ligament brace protocol and mat to get the out-of-pocket cost as he is on Medicaid   back to work November 1 plan for follow-up October 31  Encounter Diagnosis  Name Primary?  . S/P right knee arthroscopy Yes

## 2017-06-18 ENCOUNTER — Other Ambulatory Visit: Payer: Self-pay | Admitting: Orthopedic Surgery

## 2017-06-18 ENCOUNTER — Telehealth: Payer: Self-pay | Admitting: Orthopedic Surgery

## 2017-06-18 MED ORDER — HYDROCODONE-ACETAMINOPHEN 5-325 MG PO TABS: 1.0000 | ORAL_TABLET | Freq: Three times a day (TID) | ORAL | 0 refills | Status: DC | PRN

## 2017-06-18 NOTE — Telephone Encounter (Signed)
Patient called to request refill:  HYDROcodone-acetaminophen (NORCO) 5-325 MG tablet 42 tablet   - insurance, Medicaid. Patient states [place] filled prescription for 28 pills

## 2017-06-18 NOTE — Telephone Encounter (Signed)
Patient called to ask about status of brace per office visit note of 06/10/17:   "He wants an anterior cruciate ligament brace protocol and mat to get the out-of-pocket cost as he is on Medicaid" (Next scheduled appointment is 06/25/17)

## 2017-06-18 NOTE — Progress Notes (Signed)
Overdose risk 300

## 2017-06-18 NOTE — Telephone Encounter (Signed)
I have emailed Ria Clock with [facility]

## 2017-06-19 NOTE — Telephone Encounter (Signed)
Victor [name] has indicated will be next week.

## 2017-06-23 DIAGNOSIS — Z9889 Other specified postprocedural states: Secondary | ICD-10-CM | POA: Insufficient documentation

## 2017-06-25 ENCOUNTER — Ambulatory Visit (INDEPENDENT_AMBULATORY_CARE_PROVIDER_SITE_OTHER): Payer: Medicaid Other | Admitting: Orthopedic Surgery

## 2017-06-25 ENCOUNTER — Encounter: Payer: Self-pay | Admitting: Orthopedic Surgery

## 2017-06-25 VITALS — BP 141/92 | HR 99 | Ht 71.0 in | Wt 246.0 lb

## 2017-06-25 DIAGNOSIS — Z9889 Other specified postprocedural states: Secondary | ICD-10-CM

## 2017-06-25 NOTE — Patient Instructions (Signed)
Note for work for Monday

## 2017-06-25 NOTE — Progress Notes (Signed)
POST OP VISIT   Patient ID: Victor [name], male   DOB: Oct 02, 1975, 41 y.o.   MRN: [id number]  Chief Complaint  Patient presents with  . Post-op Follow-up    05/29/17 knee scope Right     Encounter Diagnosis  Name Primary?  . S/P right knee arthroscopy 05/29/17 Yes   Patient is doing well status post knee arthroscopy. His knee brace with cost $337. They would like to look around to try to find something else in the meantime is wiring a hinged knee brace  His knee looks very good he's got good flexion E Scott Sonn be 1-2 shy of extension no tenderness small trace effusion and bleeding well  Follow-up as needed any problems with the knee will come and see [place]. We're hoping to get 5 or 6 years out of this procedure.

## 2017-12-23 ENCOUNTER — Emergency Department (HOSPITAL_COMMUNITY)
Admission: EM | Admit: 2017-12-23 | Discharge: 2017-12-23 | Disposition: A | Discharge status: Home / Self Care | Payer: Medicaid Other | Attending: Emergency Medicine | Admitting: Emergency Medicine

## 2017-12-23 ENCOUNTER — Telehealth: Payer: Self-pay | Admitting: Orthopedic Surgery

## 2017-12-23 ENCOUNTER — Encounter (HOSPITAL_COMMUNITY): Payer: Self-pay | Admitting: Emergency Medicine

## 2017-12-23 ENCOUNTER — Emergency Department (HOSPITAL_COMMUNITY): Payer: Medicaid Other

## 2017-12-23 ENCOUNTER — Other Ambulatory Visit: Payer: Self-pay

## 2017-12-23 DIAGNOSIS — M25561 Pain in right knee: Secondary | ICD-10-CM | POA: Insufficient documentation

## 2017-12-23 DIAGNOSIS — Z79899 Other long term (current) drug therapy: Secondary | ICD-10-CM | POA: Insufficient documentation

## 2017-12-23 MED ORDER — IBUPROFEN 800 MG PO TABS: 800.0000 mg | ORAL_TABLET | Freq: Three times a day (TID) | ORAL | 0 refills | Status: DC

## 2017-12-23 MED ORDER — OXYCODONE-ACETAMINOPHEN 5-325 MG PO TABS: 1.0000 | ORAL_TABLET | ORAL | 0 refills | Status: DC | PRN

## 2017-12-23 NOTE — ED Triage Notes (Signed)
Pt reports right knee pain since working in the yard yesterday. Pt reports " I twisted it when I went to stand up." pt reports had arthroscopic surgery on RLE in October of 2018. Pt able to bear weight on RLE but reports increased pain.no obvious deformity noted.

## 2017-12-23 NOTE — ED Notes (Signed)
Family at bedside. 

## 2017-12-23 NOTE — Discharge Instructions (Addendum)
Elevate and apply ice packs on and off to your knee.  Minimal weightbearing.  Use your crutches for walking or standing.  Keep your appointment next week with Dr. Aline Brochure

## 2017-12-23 NOTE — ED Notes (Signed)
ED Provider at bedside. 

## 2017-12-23 NOTE — Telephone Encounter (Signed)
Call received from [facility] called at approximately 8:40a.m to relay that patient has had a new injury to same knee (right) for which he underwent arthroscopic surgery by Dr Aline Brochure 05/29/17.  Relayed that due to (1 provider only these past weeks) and Dr Aline Brochure just returning, clinic schedule is closed this week; therefore, scheduled first available and recommended to go to Emergency room or [facility] for treatment at this time. States will do so.

## 2017-12-23 NOTE — ED Provider Notes (Signed)
[facility] Provider Note   CSN: [id number] Arrival date & time: 12/23/17  1051     History   Chief Complaint Chief Complaint  Patient presents with  . Knee Pain    HPI Victor [name] is a 42 y.o. male.  HPI  Victor [name] is a 42 y.o. male recurrent right knee pain and chronic weakness of the knee secondary to previous surgeries, who presents to the Emergency Department complaining of worsening right knee pain and swelling for one day. Pain began after a twisting movement to the knee with his foot in a stationary position.  Occurred while working in the yard.  He describes a throbbing pain to the knee associated with swelling and pain is increased with weight bearing and bending.  He has applied ice without relief.  He denies numbness, redness, fever, chills.     Past Medical History:  Diagnosis Date  . GERD (gastroesophageal reflux disease)    takes over the counter meds  . History of stomach ulcers     Patient Active Problem List   Diagnosis Date Noted  . S/P right knee arthroscopy 05/29/17 06/23/2017  . 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 Complex tear of lateral meniscus of right knee as current injury   . Rupture of anterior cruciate ligament of right knee   . Chondromalacia of lateral femoral condyle, right   . Chondromalacia, patella, right     Past Surgical History:  Procedure Laterality Date  . APPENDECTOMY    . KNEE ARTHROSCOPY WITH MEDIAL MENISECTOMY Right 05/29/2017   Procedure: KNEE ARTHROSCOPY WITH MEDIAL MENISECTOMY and lateral meniscectomy;  Surgeon: [name], MD;  Location: [facility];  Service: Orthopedics;  Laterality: Right;        Home Medications    Prior to Admission medications   Medication Sig Start Date End Date Taking? Authorizing Provider  aspirin-acetaminophen-caffeine (EXCEDRIN EXTRA STRENGTH) 5803060194 MG per tablet Take 2 tablets by mouth as needed. FOR PAIN    [provider]  HYDROcodone-acetaminophen (NORCO) 5-325 MG tablet Take 1 tablet by mouth every 8 (eight) hours as needed for moderate pain. 06/18/17   [name], MD  ibuprofen (ADVIL,MOTRIN) 800 MG tablet Take 1 tablet (800 mg total) by mouth 3 (three) times daily. 06/10/17   [name], MD  promethazine (PHENERGAN) 12.5 MG tablet Take 1 tablet (12.5 mg total) by mouth every 6 (six) hours as needed for nausea or vomiting. 06/10/17   [name], MD    Family History Family History  Problem Relation Age of Onset  . Diabetes Mother   . Heart failure Mother   . Cancer Mother   . Cancer Father     Social History Social History   Tobacco Use  . Smoking status: Current Every Day Smoker    Packs/day: 0.50    Types: Cigarettes  . Smokeless tobacco: Never Used  Substance Use Topics  . Alcohol use: No  . Drug use: No     Allergies   Patient has no known allergies.   Review of Systems Review of Systems  Constitutional: Negative for chills and fever.  Respiratory: Negative for shortness of breath.   Cardiovascular: Negative for chest pain.  Musculoskeletal: Positive for arthralgias (right knee pain) and joint swelling. Negative for gait problem and neck pain.  Skin: Negative for color change, rash and wound.  Neurological: Negative for weakness and numbness.  All other systems reviewed  and are negative.    Physical Exam Updated Vital Signs BP 133/85 (BP Location: Right Arm)   Pulse 83   Temp 97.6 F (36.4 C) (Oral)   Resp 14   Ht 5\' 11"  (1.803 m)   Wt 111.1 kg (245 lb)   SpO2 97%   BMI 34.17 kg/m   Physical Exam  Constitutional: He is oriented to person, place, and time. He appears well-developed and well-nourished. No distress.  Cardiovascular: Normal rate, regular rhythm and intact distal pulses.  Pulmonary/Chest: Effort normal and breath sounds normal.  Musculoskeletal: He exhibits tenderness. He exhibits no edema.  Diffuse ttp of  anterior and latreal right knee.  Small area of bruising at the lateral knee.   No erythema, effusion, or excessive warmth or  step-off deformity.  Compartments are soft.  Neurological: He is alert and oriented to person, place, and time. He exhibits normal muscle tone. Coordination normal.  Skin: Skin is warm and dry. Capillary refill takes less than 2 seconds. No erythema.  Psychiatric: He has a normal mood and affect.  Nursing note and vitals reviewed.    ED Treatments / Results  Labs (all labs ordered are listed, but only abnormal results are displayed) Labs Reviewed - No data to display  EKG None  Radiology Dg Knee Complete 4 Views Right  Result Date: 12/23/2017 CLINICAL DATA:  Twisted knee yesterday with severe pain EXAM: RIGHT KNEE - COMPLETE 4+ VIEW COMPARISON:  Right knee films of 04/29/2017 FINDINGS: There is tricompartmental degenerative joint disease the right knee primarily involving the lateral compartment where there is more loss of joint space and spurring present. No fracture is seen. There Dern be a small amount of right knee joint fluid noted. IMPRESSION: 1. No acute fracture. Cannot exclude small amount of joint fluid on the right. 2. 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   On: 12/23/2017 12:04    Procedures Procedures (including critical care time)  Medications Ordered in ED Medications - No data to display   Initial Impression / Assessment and Plan / ED Course  I have reviewed the triage vital signs and the nursing notes.  Pertinent labs & imaging results that were available during my care of the patient were reviewed by me and considered in my medical decision making (see chart for details).     Controlled Substance Prescriptions [place] Controlled Substance Registry consulted? Yes, I have consulted the [place] Controlled Substances Registry for this patient, and feel the  risk/benefit ratio today is favorable for proceeding with this prescription for a controlled substance.   Pt with likely acute on chronic knee pain.  Doubt septic joint.  NV intact.  Pt with slow and slightly antalgic gait.  Pt has crutches at home, agrees to care plan with elevation, compression and ice.  Will f/u with Dr. Aline Brochure.  Knee sleeve applied by nursing.  Final Clinical Impressions(s) / ED Diagnoses   Final diagnoses:  Acute pain of right knee    ED Discharge Orders    None       Bufford Lope 12/23/17 2107    Nat Christen, MD 12/24/17 [phone]

## 2017-12-30 ENCOUNTER — Ambulatory Visit: Payer: Self-pay | Admitting: Orthopedic Surgery

## 2017-12-30 ENCOUNTER — Encounter: Payer: Self-pay | Admitting: Orthopedic Surgery

## 2017-12-30 ENCOUNTER — Telehealth: Payer: Self-pay | Admitting: Radiology

## 2017-12-30 VITALS — BP 193/118 | HR 90 | Ht 71.0 in | Wt 246.0 lb

## 2017-12-30 DIAGNOSIS — S83281A Other tear of lateral meniscus, current injury, right knee, initial encounter: Secondary | ICD-10-CM

## 2017-12-30 DIAGNOSIS — Z9889 Other specified postprocedural states: Secondary | ICD-10-CM

## 2017-12-30 DIAGNOSIS — M25561 Pain in right knee: Secondary | ICD-10-CM

## 2017-12-30 MED ORDER — HYDROCODONE-ACETAMINOPHEN 5-325 MG PO TABS
1.0000 | ORAL_TABLET | ORAL | 0 refills | Status: AC | PRN
Start: 2017-12-30 — End: 2018-01-04

## 2017-12-30 NOTE — Patient Instructions (Signed)
Follow up after MRI out of work x 2 weeks

## 2017-12-30 NOTE — Progress Notes (Signed)
Progress Note   Patient ID: Victor [name], male   DOB: 06-16-1976, 42 y.o.   MRN: [id number] Chief Complaint  Patient presents with  . Knee Pain    ER follow up on right knee, DOI 12-22-17.[facility] Brunette at [facility].     Medical decision-making  Imaging:  Xrays ordered: y/n ? no  Encounter Diagnoses  Name Primary?  . S/P right knee arthroscopy 05/29/17 Yes  . Acute pain of right knee   . Acute lateral meniscus tear of right knee, initial encounter       Meds ordered this encounter  Medications  . HYDROcodone-acetaminophen (NORCO/VICODIN) 5-325 MG tablet    Sig: Take 1 tablet by mouth every 4 (four) hours as needed for up to 5 days for moderate pain.    Dispense:  30 tablet    Refill:  0    PLAN:  Recommend MRI right knee Out of work x 2  weeks Per [employer] 5 days of medicine for pain acute pain    Chief Complaint  Patient presents with  . Knee Pain    ER follow up on right knee, DOI 12-22-17.Hoover Brunette at [facility].    42 year old male had knee arthroscopy October 2018: 05/29/2017  10:14 AM  PATIENT:  Victor [name]  42 y.o. male  PRE-OPERATIVE DIAGNOSIS:  Torn medial meniscus torn lateral meniscus anterior cruciate ligament deficiency right knee  POST-OPERATIVE DIAGNOSIS:  Torn medial meniscus, torn lateral meniscus, torn ACL, arthritis  Operative findings  I did an exam under anesthesia. There was a glide pivot shift and a 1+ Lachman test.  Intra-Op torn medial meniscus. Quite large tear extending from the posterior horn across the body.  Chondral surfaces of medial femoral condyle were preserved  The anterior cruciate ligament was torn and appeared to be scarred to the posterior cruciate ligament  The lateral meniscus was torn and there was a grade 2 depth grade 3 surface area lesion of the lateral femoral condyle. There was a small fissure in the trochlea. The patella had a grade 2 lesion of the medial facet.   PROCEDURE:  Procedure(s): KNEE  ARTHROSCOPY WITH MEDIAL MENISECTOMY and lateral meniscectomy (Right)    He was lying on the ground he got up his knee twisted he felt a pop he has not been able to extend his knee since then he complains of dull aching  right knee pain with swelling as well as flexion loss since April 29 x-rays were done at the hospital they show arthritis of the knee no acute fracture      Review of Systems  Constitutional: Negative.   Skin: Negative.   Neurological: Negative.    No outpatient medications have been marked as taking for the 12/30/17 encounter (Office Visit) with [name], MD.    Past Medical History:  Diagnosis Date  . GERD (gastroesophageal reflux disease)    takes over the counter meds  . History of stomach ulcers      No Known Allergies  BP (!) 193/118   Pulse 90   Ht 5\' 11"  (1.803 m)   Wt 246 lb (111.6 kg)   BMI 34.31 kg/m    Physical Exam  Constitutional: He is oriented to person, place, and time. He appears well-developed and well-nourished.  Vital signs have been reviewed and are stable. Gen. appearance the patient is well-developed and well-nourished with normal grooming and hygiene.   Musculoskeletal:       Right knee: He exhibits  effusion.  Neurological: He is alert and oriented to person, place, and time.  Skin: Skin is warm and dry. No erythema.  Psychiatric: He has a normal mood and affect.  Vitals reviewed.   Right Knee Exam   Muscle Strength  The patient has normal right knee strength.  Tenderness  The patient is experiencing tenderness in the lateral joint line.  Range of Motion  Extension:  20 normal  Flexion:  100 normal   Tests  McMurray:  Medial - negative Lateral - positive Varus: negative Valgus: negative Drawer:  Anterior - trace    Posterior - negative  Other  Erythema: absent Scars: absent Sensation: normal Pulse: present Swelling: none Effusion: effusion present   Left Knee Exam   Muscle Strength  The  patient has normal left knee strength.  Tenderness  The patient is experiencing no tenderness.   Range of Motion  Extension: normal  Flexion: normal   Tests  McMurray:  Medial - negative Lateral - negative Varus: negative Valgus: negative Drawer:  Anterior - negative     Posterior - negative  Other  Erythema: absent Scars: absent Sensation: normal Pulse: present Swelling: none

## 2017-12-30 NOTE — Telephone Encounter (Signed)
MRI sch for him, called to advise. Made follow up appt with Dr Aline Brochure and advised him to expect bill for the MRI, since medicaid is family planning only, not full coverage. He is aware.

## 2018-01-02 ENCOUNTER — Ambulatory Visit (HOSPITAL_COMMUNITY)
Admission: RE | Admit: 2018-01-02 | Discharge: 2018-01-02 | Disposition: A | Discharge status: Home / Self Care | Payer: Medicaid Other | Source: Ambulatory Visit | Attending: Orthopedic Surgery | Admitting: Orthopedic Surgery

## 2018-01-02 DIAGNOSIS — X58XXXA Exposure to other specified factors, initial encounter: Secondary | ICD-10-CM | POA: Insufficient documentation

## 2018-01-02 DIAGNOSIS — S83271A Complex tear of lateral meniscus, current injury, right knee, initial encounter: Secondary | ICD-10-CM | POA: Insufficient documentation

## 2018-01-02 DIAGNOSIS — Z9889 Other specified postprocedural states: Secondary | ICD-10-CM | POA: Insufficient documentation

## 2018-01-02 DIAGNOSIS — S83231A Complex tear of medial meniscus, current injury, right knee, initial encounter: Secondary | ICD-10-CM | POA: Insufficient documentation

## 2018-01-02 DIAGNOSIS — S83281A Other tear of lateral meniscus, current injury, right knee, initial encounter: Secondary | ICD-10-CM | POA: Insufficient documentation

## 2018-01-02 DIAGNOSIS — M949 Disorder of cartilage, unspecified: Secondary | ICD-10-CM | POA: Insufficient documentation

## 2018-01-02 DIAGNOSIS — M25561 Pain in right knee: Secondary | ICD-10-CM | POA: Insufficient documentation

## 2018-01-06 ENCOUNTER — Encounter: Payer: Self-pay | Admitting: Orthopedic Surgery

## 2018-01-06 ENCOUNTER — Ambulatory Visit: Payer: Self-pay | Admitting: Orthopedic Surgery

## 2018-01-06 VITALS — BP 135/94 | HR 77 | Ht 71.0 in | Wt 246.0 lb

## 2018-01-06 DIAGNOSIS — M23321 Other meniscus derangements, posterior horn of medial meniscus, right knee: Secondary | ICD-10-CM

## 2018-01-06 DIAGNOSIS — M23300 Other meniscus derangements, unspecified lateral meniscus, right knee: Secondary | ICD-10-CM

## 2018-01-06 NOTE — Progress Notes (Signed)
FOLLOW UP VISIT : MRI RESULTS   Chief Complaint  Patient presents with  . Follow-up    Recheck on righ tknee, MRI results.     HPI: The patient is here TO DISCUSS THE RESULTS OF MRI  28 male status post right knee arthroscopy medial meniscectomy exam under anesthesia with ACL deficient knee (October 2018) reinjured right knee had MRI MRI shows torn medial and lateral meniscus with 3 compartment arthrosis   Review of Systems  All other systems reviewed and are negative.    BP (!) 135/94   Pulse 77   Ht 5\' 11"  (1.803 m)   Wt 246 lb (111.6 kg)   BMI 34.31 kg/m     Medical decision-making section   DATA  MRI REPORT:  CLINICAL DATA:  Right knee swelling and bruising. Injured 2 weeks ago. Felt a pop.   EXAM: MRI OF THE RIGHT KNEE WITHOUT CONTRAST   TECHNIQUE: Multiplanar, multisequence MR imaging of the knee was performed. No intravenous contrast was administered.   COMPARISON:  None.   FINDINGS: MENISCI   Medial meniscus: Complex tear of the posterior horn of the medial meniscus with a radial component.   Lateral meniscus: Complex tear of the posterior horn of the lateral meniscus. Oblique tear of the body of the lateral meniscus extending to the inferior articular surface.   LIGAMENTS   Cruciates:  Intact PCL.  Complete ACL tear.   Collaterals: Medial collateral ligament is intact. Lateral collateral ligament complex is intact.   CARTILAGE   Patellofemoral: Partial-thickness cartilage loss of the lateral patellar facet.   Medial: Partial-thickness cartilage loss with small areas of high-grade partial-thickness cartilage loss of the medial femoral condyle. Mild partial-thickness cartilage loss of the medial tibial plateau.   Lateral: Partial-thickness cartilage loss of the lateral femorotibial compartment with marginal osteophytes.   Joint: No joint effusion. Mild edema in Hoffa's fat. No plical thickening.   Popliteal Fossa: No significant  Baker's cyst. Intact popliteus tendon.   Extensor Mechanism: Intact quadriceps tendon. Intact patellar tendon. Intact medial patellar retinaculum. Intact lateral patellar retinaculum. Intact MPFL.   Bones: No focal marrow signal abnormality. No fracture or dislocation.   Other: No fluid collection or hematoma.  Muscles are normal.   IMPRESSION: 1. Complex tear of the posterior horn of the medial meniscus with a radial component. 2. Complex tear of the posterior horn of the lateral meniscus. Oblique tear of the body of the lateral meniscus extending to the inferior articular surface. 3. Tricompartmental cartilage abnormalities as described above.     Electronically Signed   By: Kathreen Devoid   On: 01/02/2018 15:29      MY READING: MRI OF THE   All 3 compartments of partial cartilage thickness loss the medial meniscus looks like it has re-torn posterior horn and in the lateral meniscus is a complex tear   Encounter Diagnoses  Name Primary?  . Derangement of posterior horn of medial meniscus of right knee Yes  . Meniscus, lateral, derangement, right    Worse!   PLAN:    Recommend surgical arthroscopy partial medial and lateral meniscectomy  Patient should be out of work 6 weeks starting from [place] [facility]  The procedure has been fully reviewed with the patient; The risks and benefits of surgery have been discussed and explained and understood. Alternative treatment has also been reviewed, questions were encouraged and answered. The postoperative plan is also been reviewed.

## 2018-01-06 NOTE — Patient Instructions (Signed)
Need a note for OOW for 6 weeks from [place] [address]   Meniscus Injury, Arthroscopy Arthroscopy is a surgical procedure that involves the use of a small scope that has a camera and surgical instruments on the end (arthroscope). An arthroscope can be used to repair your meniscus injury.  LET [facility] CARE PROVIDER KNOW ABOUT:  Any allergies you have.  All medicines you are taking, including vitamins, herbs, eyedrops, creams, and over-the-counter medicines.  Any recent colds or infections you have had or currently have.  Previous problems you or members of your family have had with the use of anesthetics.  Any blood disorders or blood clotting problems you have.  Previous surgeries you have had.  Medical conditions you have. RISKS AND COMPLICATIONS Generally, this is a safe procedure. However, as with any procedure, problems can occur. Possible problems include:  Damage to nerves or blood vessels.  Excess bleeding.  Blood clots.  Infection. BEFORE THE PROCEDURE  Do not eat or drink for 6-8 hours before the procedure.  Take medicines as directed by your surgeon. Ask your surgeon about changing or stopping your regular medicines.  You Letterman have lab tests the morning of surgery. PROCEDURE  You will be given one of the following:   A medicine that numbs the area (local anesthesia).  A medicine that makes you go to sleep (general anesthesia).  A medicine injected into your spine that numbs your body below the waist (spinal anesthesia). Most often, several small cuts (incisions) are made in the knee. The arthroscope and instruments go into the incisions to repair the damage. The torn portion of the meniscus is removed.   AFTER THE PROCEDURE  You will be taken to the recovery area where your progress will be monitored. When you are awake, stable, and taking fluids without complications, you will be allowed to go home. This is usually the same day. A torn or stretched ligament  (ligament sprain) Laham take 6-8 weeks to heal.   It takes about the 4-6 WEEKS if your surgeon removed a torn meniscus.  A repaired meniscus Adriano require 6-12 weeks of recovery time.  A torn ligament needing reconstructive surgery Leason take 6-12 months to heal fully.   This information is not intended to replace advice given to you by your health care provider. Make sure you discuss any questions you have with your health care provider. You have decided to proceed with operative arthroscopy of the knee. You have decided not to continue with nonoperative measures such as but not limited to oral medication, weight loss, activity modification, physical therapy, bracing, or injection.  We will perform operative arthroscopy of the knee. Some of the risks associated with arthroscopic surgery of the knee include but are not limited to Bleeding Infection Swelling Stiffness Blood clot Pain  If you're not comfortable with these risks and would like to continue with nonoperative treatment please let Dr. Aline Brochure know prior to your surgery.   Document Released: 08/09/2000 Document Revised: 08/17/2013 Document Reviewed: 01/08/2013 Elsevier Interactive Patient Education 2016 [place] have decided to proceed with operative arthroscopy of the knee. You have decided not to continue with nonoperative measures such as but not limited to oral medication, weight loss, activity modification, physical therapy, bracing, or injection.  We will perform operative arthroscopy of the knee. Some of the risks associated with arthroscopic surgery of the knee include but are not limited to Bleeding Infection Swelling Stiffness Blood clot Pain  If you're not comfortable with these  risks and would like to continue with nonoperative treatment please let Dr. Aline Brochure know prior to your surgery.

## 2018-01-15 ENCOUNTER — Telehealth: Payer: Self-pay | Admitting: Orthopedic Surgery

## 2018-01-15 ENCOUNTER — Other Ambulatory Visit: Payer: Self-pay | Admitting: Orthopedic Surgery

## 2018-01-15 NOTE — Telephone Encounter (Signed)
Patient"s wife called to let Dr. Aline Brochure know that Victor [name]'s Medicaid will come into effect June 1st. They are wanting to set up the surgery after that.  Please call and advise

## 2018-01-15 NOTE — Telephone Encounter (Signed)
Huetter for me to post on June 6th at 7:30 ? His Medicaid takes effect June 1st.   You stated in office recommend surgical arthroscopy partial medial and lateral meniscectomy (right knee)

## 2018-01-15 NOTE — Telephone Encounter (Signed)
Oxycodone-Acetaminophen  5/325 mg   Patient's wife states he is out.  PATIENT USES [address].

## 2018-01-16 NOTE — Telephone Encounter (Signed)
June 13

## 2018-01-20 ENCOUNTER — Other Ambulatory Visit: Payer: Self-pay | Admitting: Orthopedic Surgery

## 2018-01-20 NOTE — Telephone Encounter (Signed)
Victor [name] i is there anyway to send that the regular prescription way

## 2018-01-20 NOTE — Telephone Encounter (Signed)
I called patient to discuss surgery date He agrees to June 13th, orders placed He is requesting pain meds Hydrocodone and Ibuprofen   Victor [name]

## 2018-01-20 NOTE — Telephone Encounter (Signed)
Patient called again wanting to know about his surgery.  Please call him at [phone]

## 2018-01-21 ENCOUNTER — Other Ambulatory Visit: Payer: Self-pay | Admitting: Orthopedic Surgery

## 2018-01-21 DIAGNOSIS — M25561 Pain in right knee: Secondary | ICD-10-CM

## 2018-01-21 MED ORDER — HYDROCODONE-ACETAMINOPHEN 5-325 MG PO TABS: 1.0000 | ORAL_TABLET | Freq: Four times a day (QID) | ORAL | 0 refills | Status: AC | PRN

## 2018-01-21 NOTE — Telephone Encounter (Signed)
What do you mean? I pended it for you, but you need to review the quantity and sig first. Not sure what you want to write for him, since this is new.

## 2018-01-27 NOTE — Patient Instructions (Signed)
Victor [name]  01/27/2018     @PREFPERIOPPHARMACY @   Your procedure is scheduled on  02/05/2018   Report to [facility] at  1135   A.M.  Call this number if you have problems the morning of surgery:  [phone]   Remember:  No food after midnight.  You Falzone drink clear liquids until  12 midnight 02/04/2018  .  Clear liquids allowed are:                    Water, Juice (non-citric and without pulp), Carbonated beverages, Clear Tea, Black Coffee only, Plain Jell-O only, Gatorade and Plain Popsicles only    Take these medicines the morning of surgery with A SIP OF WATER  Oxycodone, phenergan.    Do not wear jewelry, make-up or nail polish.  Do not wear lotions, powders, or perfumes, or deodorant.  Do not shave 48 hours prior to surgery.  Men Dedeaux shave face and neck.  Do not bring valuables to the hospital.  [facility] is not responsible for any belongings or valuables.  Contacts, dentures or bridgework Osterberg not be worn into surgery.  Leave your suitcase in the car.  After surgery it Keziah be brought to your room.  For patients admitted to the hospital, discharge time will be determined by your treatment team.  Patients discharged the day of surgery will not be allowed to drive home.   Name and phone number of your driver:   family Special instructions:  None  Please read over the following fact sheets that you were given. Anesthesia Post-op Instructions and Care and Recovery After Surgery      Knee Ligament Injury, Arthroscopy Arthroscopy is a surgical technique in which your health care provider examines your knee through a small, pencil-sized telescope (arthroscope). Often, repairs to injured ligaments can be done with instruments in the arthroscope. Arthroscopy is less invasive than open-knee surgery. Tell a health care provider about:  Any allergies you have.  All medicines you are taking, including vitamins, herbs, eye drops, creams, and over-the-counter  medicines.  Any problems you or family members have had with anesthetic medicines.  Any blood disorders you have.  Any surgeries you have had.  Any medical conditions you have. What are the risks? Generally, this is a safe procedure. However, as with any procedure, problems can occur. Possible problems include:  Infection.  Bleeding.  Stiffness.  What happens before the procedure?  Ask your health care provider about changing or stopping any regular medicines. Avoid taking aspirin or blood thinners as directed by your health care provider.  Do not eat or drink anything after midnight the night before surgery.  If you smoke, do not smoke for at least 2 weeks before your surgery.  Do not drink alcohol starting the day before your surgery.  Let your health care provider know if you develop a cold or any infection before your surgery.  Arrange for someone to drive you home after the surgery or after your hospital stay. Also arrange for someone to help you with activities during recovery. What happens during the procedure?  Small monitors will be put on your body. They are used to check your heart, blood pressure, and oxygen levels.  An IV access tube will be put into one of your veins. Medicine will be able to flow directly into your body through this IV tube.  You might be given a medicine to  help you relax (sedative).  You will be given a medicine that makes you go to sleep (general anesthetic), and a breathing tube will be placed into your lungs during the procedure.  Several small incisions are made in your knee. Saline fluid is placed into one of the incisions to expand the knee and clear away any blood in the knee.  Your health care provider will insert the arthroscope to examine the injured knee.  During arthroscopy, your health care provider Jeremiah find a partial or complete tear in a ligament.  Tools can be inserted through the other incisions to repair the injured  ligaments.  The incisions are then closed with absorbable stitches and covered with dressings. What happens after the procedure?  You will be taken to the recovery area where you will be monitored.  When you are awake, stable, and taking fluids without problems, you will be allowed to go home. This information is not intended to replace advice given to you by your health care provider. Make sure you discuss any questions you have with your health care provider. Document Released: 08/09/2000 Document Revised: 01/18/2016 Document Reviewed: 03/24/2013 Elsevier Interactive Patient Education  2017 [place].  Arthroscopic Knee Ligament Repair, Care After This sheet gives you information about how to care for yourself after your procedure. Your health care provider Atkerson also give you more specific instructions. If you have problems or questions, contact your health care provider. What can I expect after the procedure? After the procedure, it is common to have:  Pain in your knee.  Bruising and swelling on your knee, calf, and ankle for 3-4 days.  Fatigue.  Follow these instructions at home: If you have a brace or immobilizer:  Wear the brace or immobilizer as told by your health care provider. Remove it only as told by your health care provider.  Loosen the splint or immobilizer if your toes tingle, become numb, or turn cold and blue.  Keep the brace or immobilizer clean. Bathing  Do not take baths, swim, or use a hot tub until your health care provider approves. Ask your health care provider if you can take showers.  Keep your bandage (dressing) dry until your health care provider says that it can be removed. Cover it and your brace or immobilizer with a watertight covering when you take a shower. Incision care  Follow instructions from your health care provider about how to take care of your incision. Make sure you: ? Wash your hands with soap and water before you change your  bandage (dressing). If soap and water are not available, use hand sanitizer. ? Change your dressing as told by your health care provider. ? Leave stitches (sutures), skin glue, or adhesive strips in place. These skin closures Lemaire need to stay in place for 2 weeks or longer. If adhesive strip edges start to loosen and curl up, you Motz trim the loose edges. Do not remove adhesive strips completely unless your health care provider tells you to do that.  Check your incision area every day for signs of infection. Check for: ? More redness, swelling, or pain. ? More fluid or blood. ? Warmth. ? Pus or a bad smell. Managing pain, stiffness, and swelling  If directed, put ice on the affected area. ? If you have a removable brace or immobilizer, remove it as told by your health care provider. ? Put ice in a plastic bag. ? Place a towel between your skin and the bag or between  your brace or immobilizer and the bag. ? Leave the ice on for 20 minutes, 2-3 times a day.  Move your toes often to avoid stiffness and to lessen swelling.  Raise (elevate) the injured area above the level of your heart while you are sitting or lying down. Driving  Do not drive until your health care provider approves. If you have a brace or immobilizer on your leg, ask your health care provider when it is safe for you to drive.  Do not drive or use heavy machinery while taking prescription pain medicine. Activity  Rest as directed. Ask your health care provider what activities are safe for you.  Do physical therapy exercises as told by your health care provider. Physical therapy will help you regain strength and motion in your knee.  Follow instructions from your health care provider about: ? When you Dickens start motion exercises. ? When you Connole start riding a stationary bike and doing other low-impact activities. ? When you Granquist start to jog and do other high-impact activities. Safety  Do not use the injured limb to  support your body weight until your health care provider says that you can. Use crutches as told by your health care provider. General instructions  Do not use any products that contain nicotine or tobacco, such as cigarettes and e-cigarettes. These can delay bone healing. If you need help quitting, ask your health care provider.  To prevent or treat constipation while you are taking prescription pain medicine, your health care provider Harral recommend that you: ? Drink enough fluid to keep your urine clear or pale yellow. ? Take over-the-counter or prescription medicines. ? Eat foods that are high in fiber, such as fresh fruits and vegetables, whole grains, and beans. ? Limit foods that are high in fat and processed sugars, such as fried and sweet foods.  Take over-the-counter and prescription medicines only as told by your health care provider.  Keep all follow-up visits as told by your health care provider. This is important. Contact a health care provider if:  You have more redness, swelling, or pain around an incision.  You have more fluid or blood coming from an incision.  Your incision feels warm to the touch.  You have a fever.  You have pain or swelling in your knee, and it gets worse.  You have pain that does not get better with medicine. Get help right away if:  You have trouble breathing.  You have pus or a bad smell coming from an incision.  You have numbness and tingling near the knee joint. Summary  After the procedure, it is common to have knee pain with bruising and swelling on your knee, calf, and ankle.  Icing your knee and raising your leg above the level of your heart will help control the pain and the swelling.  Do physical therapy exercises as told by your health care provider. Physical therapy will help you regain strength and motion in your knee. This information is not intended to replace advice given to you by your health care provider. Make sure you  discuss any questions you have with your health care provider. Document Released: 06/02/2013 Document Revised: 08/06/2016 Document Reviewed: 08/06/2016 Elsevier Interactive Patient Education  2017 [place] Anesthesia, Adult General anesthesia is the use of medicines to make a person "go to sleep" (be unconscious) for a medical procedure. General anesthesia is often recommended when a procedure:  Is long.  Requires you to be still  or in an unusual position.  Is major and can cause you to lose blood.  Is impossible to do without general anesthesia.  The medicines used for general anesthesia are called general anesthetics. In addition to making you sleep, the medicines:  Prevent pain.  Control your blood pressure.  Relax your muscles.  Tell a health care provider about:  Any allergies you have.  All medicines you are taking, including vitamins, herbs, eye drops, creams, and over-the-counter medicines.  Any problems you or family members have had with anesthetic medicines.  Types of anesthetics you have had in the past.  Any bleeding disorders you have.  Any surgeries you have had.  Any medical conditions you have.  Any history of heart or lung conditions, such as heart failure, sleep apnea, or chronic obstructive pulmonary disease (COPD).  Whether you are pregnant or Schecter be pregnant.  Whether you use tobacco, alcohol, marijuana, or street drugs.  Any history of Armed forces logistics/support/administrative officer.  Any history of depression or anxiety. What are the risks? Generally, this is a safe procedure. However, problems Hyer occur, including:  Allergic reaction to anesthetics.  Lung and heart problems.  Inhaling food or liquids from your stomach into your lungs (aspiration).  Injury to nerves.  Waking up during your procedure and being unable to move (rare).  Extreme agitation or a state of mental confusion (delirium) when you wake up from the anesthetic.  Air in the  bloodstream, which can lead to stroke.  These problems are more likely to develop if you are having a major surgery or if you have an advanced medical condition. You can prevent some of these complications by answering all of your health care provider's questions thoroughly and by following all pre-procedure instructions. General anesthesia can cause side effects, including:  Nausea or vomiting  A sore throat from the breathing tube.  Feeling cold or shivery.  Feeling tired, washed out, or achy.  Sleepiness or drowsiness.  Confusion or agitation.  What happens before the procedure? Staying hydrated Follow instructions from your health care provider about hydration, which Rathman include:  Up to 2 hours before the procedure - you Brackney continue to drink clear liquids, such as water, clear fruit juice, black coffee, and plain tea.  Eating and drinking restrictions Follow instructions from your health care provider about eating and drinking, which Pezzullo include:  8 hours before the procedure - stop eating heavy meals or foods such as meat, fried foods, or fatty foods.  6 hours before the procedure - stop eating light meals or foods, such as toast or cereal.  6 hours before the procedure - stop drinking milk or drinks that contain milk.  2 hours before the procedure - stop drinking clear liquids.  Medicines  Ask your health care provider about: ? Changing or stopping your regular medicines. This is especially important if you are taking diabetes medicines or blood thinners. ? Taking medicines such as aspirin and ibuprofen. These medicines can thin your blood. Do not take these medicines before your procedure if your health care provider instructs you not to. ? Taking new dietary supplements or medicines. Do not take these during the week before your procedure unless your health care provider approves them.  If you are told to take a medicine or to continue taking a medicine on the day of  the procedure, take the medicine with sips of water. General instructions   Ask if you will be going home the same day, the following day,  or after a longer hospital stay. ? Plan to have someone take you home. ? Plan to have someone stay with you for the first 24 hours after you leave the hospital or clinic.  For 3-6 weeks before the procedure, try not to use any tobacco products, such as cigarettes, chewing tobacco, and e-cigarettes.  You Cerros brush your teeth on the morning of the procedure, but make sure to spit out the toothpaste. What happens during the procedure?  You will be given anesthetics through a mask and through an IV tube in one of your veins.  You Montag receive medicine to help you relax (sedative).  As soon as you are asleep, a breathing tube Bartus be used to help you breathe.  An anesthesia specialist will stay with you throughout the procedure. He or she will help keep you comfortable and safe by continuing to give you medicines and adjusting the amount of medicine that you get. He or she will also watch your blood pressure, pulse, and oxygen levels to make sure that the anesthetics do not cause any problems.  If a breathing tube was used to help you breathe, it will be removed before you wake up. The procedure Caravello vary among health care providers and hospitals. What happens after the procedure?  You will wake up, often slowly, after the procedure is complete, usually in a recovery area.  Your blood pressure, heart rate, breathing rate, and blood oxygen level will be monitored until the medicines you were given have worn off.  You Rosiak be given medicine to help you calm down if you feel anxious or agitated.  If you will be going home the same day, your health care provider Nordell check to make sure you can stand, drink, and urinate.  Your health care providers will treat your pain and side effects before you go home.  Do not drive for 24 hours if you received a  sedative.  You Carfagno: ? Feel nauseous and vomit. ? Have a sore throat. ? Have mental slowness. ? Feel cold or shivery. ? Feel sleepy. ? Feel tired. ? Feel sore or achy, even in parts of your body where you did not have surgery. This information is not intended to replace advice given to you by your health care provider. Make sure you discuss any questions you have with your health care provider. Document Released: 11/19/2007 Document Revised: 01/23/2016 Document Reviewed: 07/27/2015 Elsevier Interactive Patient Education  2018 [place] Anesthesia, Adult, Care After These instructions provide you with information about caring for yourself after your procedure. Your health care provider Benko also give you more specific instructions. Your treatment has been planned according to current medical practices, but problems sometimes occur. Call your health care provider if you have any problems or questions after your procedure. What can I expect after the procedure? After the procedure, it is common to have:  Vomiting.  A sore throat.  Mental slowness.  It is common to feel:  Nauseous.  Cold or shivery.  Sleepy.  Tired.  Sore or achy, even in parts of your body where you did not have surgery.  Follow these instructions at home: For at least 24 hours after the procedure:  Do not: ? Participate in activities where you could fall or become injured. ? Drive. ? Use heavy machinery. ? Drink alcohol. ? Take sleeping pills or medicines that cause drowsiness. ? Make important decisions or sign legal documents. ? Take care of children on your own.  Rest. Eating and drinking  If you vomit, drink water, juice, or soup when you can drink without vomiting.  Drink enough fluid to keep your urine clear or pale yellow.  Make sure you have little or no nausea before eating solid foods.  Follow the diet recommended by your health care provider. General instructions  Have a  responsible adult stay with you until you are awake and alert.  Return to your normal activities as told by your health care provider. Ask your health care provider what activities are safe for you.  Take over-the-counter and prescription medicines only as told by your health care provider.  If you smoke, do not smoke without supervision.  Keep all follow-up visits as told by your health care provider. This is important. Contact a health care provider if:  You continue to have nausea or vomiting at home, and medicines are not helpful.  You cannot drink fluids or start eating again.  You cannot urinate after 8-12 hours.  You develop a skin rash.  You have fever.  You have increasing redness at the site of your procedure. Get help right away if:  You have difficulty breathing.  You have chest pain.  You have unexpected bleeding.  You feel that you are having a life-threatening or urgent problem. This information is not intended to replace advice given to you by your health care provider. Make sure you discuss any questions you have with your health care provider. Document Released: 11/18/2000 Document Revised: 01/15/2016 Document Reviewed: 07/27/2015 Elsevier Interactive Patient Education  [employer].

## 2018-01-29 ENCOUNTER — Encounter (HOSPITAL_COMMUNITY): Payer: Self-pay

## 2018-01-29 ENCOUNTER — Encounter (HOSPITAL_COMMUNITY)
Admission: RE | Admit: 2018-01-29 | Discharge: 2018-01-29 | Disposition: A | Discharge status: Home / Self Care | Payer: Medicaid Other | Source: Ambulatory Visit | Attending: Orthopedic Surgery | Admitting: Orthopedic Surgery

## 2018-01-29 DIAGNOSIS — Z01812 Encounter for preprocedural laboratory examination: Secondary | ICD-10-CM | POA: Insufficient documentation

## 2018-01-29 LAB — CBC WITH DIFFERENTIAL/PLATELET
BASOS ABS: 0 10*3/uL (ref 0.0–0.1)
Basophils Relative: 0 %
Eosinophils Absolute: 0.1 10*3/uL (ref 0.0–0.7)
Eosinophils Relative: 1 %
HEMATOCRIT: 45.2 % (ref 39.0–52.0)
Hemoglobin: 15.1 g/dL (ref 13.0–17.0)
LYMPHS ABS: 2.8 10*3/uL (ref 0.7–4.0)
LYMPHS PCT: 23 %
MCH: 30 pg (ref 26.0–34.0)
MCHC: 33.4 g/dL (ref 30.0–36.0)
MCV: 89.9 fL (ref 78.0–100.0)
Monocytes Absolute: 0.9 10*3/uL (ref 0.1–1.0)
Monocytes Relative: 7 %
NEUTROS ABS: 8.5 10*3/uL — AB (ref 1.7–7.7)
Neutrophils Relative %: 69 %
Platelets: 266 10*3/uL (ref 150–400)
RBC: 5.03 MIL/uL (ref 4.22–5.81)
RDW: 13.4 % (ref 11.5–15.5)
WBC: 12.3 10*3/uL — AB (ref 4.0–10.5)

## 2018-01-29 LAB — BASIC METABOLIC PANEL
ANION GAP: 8 (ref 5–15)
BUN: 12 mg/dL (ref 6–20)
CHLORIDE: 105 mmol/L (ref 101–111)
CO2: 27 mmol/L (ref 22–32)
Calcium: 9.3 mg/dL (ref 8.9–10.3)
Creatinine, Ser: 1.03 mg/dL (ref 0.61–1.24)
GFR calc Af Amer: >60 mL/min (ref >60)
GLUCOSE: 94 mg/dL (ref 65–99)
POTASSIUM: 3.8 mmol/L (ref 3.5–5.1)
SODIUM: 140 mmol/L (ref 135–145)

## 2018-02-04 NOTE — H&P (Signed)
Victor [name] is an 42 y.o. male.   Chief Complaint: Right knee pain HPI: This is a 42 year old male had a knee scope right knee and did well October 2018 however, on April 29 he went to the emergency room after twisting his knee.  He tells [place] he was lying on the ground got up twisted his knee felt a pop and since that time is been able to extend his knee complaining of a dull aching lateral and medial pain if not diffuse knee pain associated with swelling since the injury.  He had a new MRI which shows a new tear and he presents now for surgery.  Past Medical History:  Diagnosis Date  . GERD (gastroesophageal reflux disease)    takes over the counter meds  . History of stomach ulcers     Past Surgical History:  Procedure Laterality Date  . APPENDECTOMY    . KNEE ARTHROSCOPY WITH MEDIAL MENISECTOMY Right 05/29/2017   Procedure: KNEE ARTHROSCOPY WITH MEDIAL MENISECTOMY and lateral meniscectomy;  Surgeon: [name], MD;  Location: [facility];  Service: Orthopedics;  Laterality: Right;    Family History  Problem Relation Age of Onset  . Diabetes Mother   . Heart failure Mother   . Cancer Mother   . Cancer Father    Social History:  reports that he has been smoking cigarettes.  He has been smoking about 0.50 packs per day. He has never used smokeless tobacco. He reports that he does not drink alcohol or use drugs.  Allergies: No Known Allergies  No medications prior to admission.    No results found for this or any previous visit (from the past 48 hour(s)). No results found.  ROS Normal review of systems, denies constitutional symptoms history of skin rash neurologic symptoms all other systems were reviewed were negative   There were no vitals taken for this visit. Physical Exam  Constitutional: He is oriented to person, place, and time. He appears well-developed and well-nourished. No distress.  HENT:  Head: Normocephalic and atraumatic.  Right Ear: External ear normal.   Left Ear: External ear normal.  Nose: Nose normal.  Mouth/Throat: No oropharyngeal exudate.  Eyes: Pupils are equal, round, and reactive to light. Conjunctivae and EOM are normal. Right eye exhibits no discharge. Left eye exhibits no discharge. No scleral icterus.  Neck: Normal range of motion. Neck supple. No JVD present. No tracheal deviation present. No thyromegaly present.  Cardiovascular: Normal rate, normal heart sounds and intact distal pulses.  Respiratory: Effort normal. No respiratory distress. He has no wheezes. He exhibits no tenderness.  GI: Soft. Bowel sounds are normal. He exhibits no distension.  Lymphadenopathy:       Right cervical: No superficial cervical adenopathy present.      Left cervical: No superficial cervical adenopathy present.  Neurological: He is alert and oriented to person, place, and time. He has normal reflexes. He displays normal reflexes. No cranial nerve deficit. He exhibits normal muscle tone. Coordination normal.  Skin: Skin is warm and dry. No rash noted. He is not diaphoretic. No erythema.  Psychiatric: He has a normal mood and affect. His behavior is normal. Judgment and thought content normal.    Right knee exam strength is normal lateral joint line is tender range of motion is 20 to 100 degrees lateral meniscal McMurray sign is positive varus valgus test normal drawer test normal skin normal no scars except for portal site sensation pulse normal no swelling small effusion  Left  knee exam normal range of motion stability strength alignment no tenderness or swelling neurovascular exam intact  Upper extremities normal Assessment/Plan  CLINICAL DATA:  Right knee swelling and bruising. Injured 2 weeks ago. Felt a pop.   EXAM: MRI OF THE RIGHT KNEE WITHOUT CONTRAST   TECHNIQUE: Multiplanar, multisequence MR imaging of the knee was performed. No intravenous contrast was administered.   COMPARISON:  None.   FINDINGS: MENISCI   Medial  meniscus: Complex tear of the posterior horn of the medial meniscus with a radial component.   Lateral meniscus: Complex tear of the posterior horn of the lateral meniscus. Oblique tear of the body of the lateral meniscus extending to the inferior articular surface.   LIGAMENTS   Cruciates:  Intact PCL.  Complete ACL tear.   Collaterals: Medial collateral ligament is intact. Lateral collateral ligament complex is intact.   CARTILAGE   Patellofemoral: Partial-thickness cartilage loss of the lateral patellar facet.   Medial: Partial-thickness cartilage loss with small areas of high-grade partial-thickness cartilage loss of the medial femoral condyle. Mild partial-thickness cartilage loss of the medial tibial plateau.   Lateral: Partial-thickness cartilage loss of the lateral femorotibial compartment with marginal osteophytes.   Joint: No joint effusion. Mild edema in Hoffa's fat. No plical thickening.   Popliteal Fossa: No significant Baker's cyst. Intact popliteus tendon.   Extensor Mechanism: Intact quadriceps tendon. Intact patellar tendon. Intact medial patellar retinaculum. Intact lateral patellar retinaculum. Intact MPFL.   Bones: No focal marrow signal abnormality. No fracture or dislocation.   Other: No fluid collection or hematoma.  Muscles are normal.   IMPRESSION: 1. Complex tear of the posterior horn of the medial meniscus with a radial component. 2. Complex tear of the posterior horn of the lateral meniscus. Oblique tear of the body of the lateral meniscus extending to the inferior articular surface. 3. Tricompartmental cartilage abnormalities as described above.     Electronically Signed   By: Kathreen Devoid   On: 01/02/2018 15:29    Medial and lateral meniscal tears 3 compartment degenerative changes as noted on MRI plan for arthroscopy right knee medial and lateral meniscectomy   Arther Abbott, MD 02/04/2018, 12:28 PM

## 2018-02-05 ENCOUNTER — Ambulatory Visit (HOSPITAL_COMMUNITY)
Admission: RE | Admit: 2018-02-05 | Discharge: 2018-02-05 | Disposition: A | Discharge status: Home / Self Care | Payer: Medicaid Other | Source: Ambulatory Visit | Attending: Orthopedic Surgery | Admitting: Orthopedic Surgery

## 2018-02-05 ENCOUNTER — Telehealth: Payer: Self-pay | Admitting: Orthopedic Surgery

## 2018-02-05 ENCOUNTER — Encounter (HOSPITAL_COMMUNITY): Payer: Self-pay | Admitting: *Deleted

## 2018-02-05 ENCOUNTER — Ambulatory Visit (HOSPITAL_COMMUNITY): Payer: Medicaid Other | Admitting: Anesthesiology

## 2018-02-05 ENCOUNTER — Encounter (HOSPITAL_COMMUNITY)
Admission: RE | Disposition: A | Discharge status: Home / Self Care | Payer: Self-pay | Source: Ambulatory Visit | Attending: Orthopedic Surgery

## 2018-02-05 DIAGNOSIS — Z8249 Family history of ischemic heart disease and other diseases of the circulatory system: Secondary | ICD-10-CM | POA: Insufficient documentation

## 2018-02-05 DIAGNOSIS — K219 Gastro-esophageal reflux disease without esophagitis: Secondary | ICD-10-CM | POA: Insufficient documentation

## 2018-02-05 DIAGNOSIS — Y9389 Activity, other specified: Secondary | ICD-10-CM | POA: Insufficient documentation

## 2018-02-05 DIAGNOSIS — M23203 Derangement of unspecified medial meniscus due to old tear or injury, right knee: Secondary | ICD-10-CM

## 2018-02-05 DIAGNOSIS — Z8711 Personal history of peptic ulcer disease: Secondary | ICD-10-CM | POA: Diagnosis not present

## 2018-02-05 DIAGNOSIS — X500XXA Overexertion from strenuous movement or load, initial encounter: Secondary | ICD-10-CM | POA: Diagnosis not present

## 2018-02-05 DIAGNOSIS — M23351 Other meniscus derangements, posterior horn of lateral meniscus, right knee: Secondary | ICD-10-CM

## 2018-02-05 DIAGNOSIS — S83241A Other tear of medial meniscus, current injury, right knee, initial encounter: Secondary | ICD-10-CM | POA: Diagnosis not present

## 2018-02-05 DIAGNOSIS — F17210 Nicotine dependence, cigarettes, uncomplicated: Secondary | ICD-10-CM | POA: Diagnosis not present

## 2018-02-05 DIAGNOSIS — S83281A Other tear of lateral meniscus, current injury, right knee, initial encounter: Secondary | ICD-10-CM | POA: Diagnosis not present

## 2018-02-05 DIAGNOSIS — Y9289 Other specified places as the place of occurrence of the external cause: Secondary | ICD-10-CM | POA: Insufficient documentation

## 2018-02-05 DIAGNOSIS — Z8719 Personal history of other diseases of the digestive system: Secondary | ICD-10-CM | POA: Insufficient documentation

## 2018-02-05 DIAGNOSIS — X58XXXA Exposure to other specified factors, initial encounter: Secondary | ICD-10-CM | POA: Insufficient documentation

## 2018-02-05 DIAGNOSIS — Z809 Family history of malignant neoplasm, unspecified: Secondary | ICD-10-CM | POA: Diagnosis not present

## 2018-02-05 DIAGNOSIS — Z833 Family history of diabetes mellitus: Secondary | ICD-10-CM | POA: Insufficient documentation

## 2018-02-05 HISTORY — PX: KNEE ARTHROSCOPY WITH MEDIAL MENISECTOMY: SHX5651

## 2018-02-05 SURGERY — ARTHROSCOPY, KNEE, WITH MEDIAL MENISCECTOMY
Anesthesia: General | Site: Knee | Laterality: Right

## 2018-02-05 MED ORDER — ONDANSETRON HCL 4 MG/2ML IJ SOLN
4.0000 mg | Freq: Once | INTRAMUSCULAR | Status: AC
  Administered 2018-02-05: 4 mg via INTRAVENOUS
  Filled 2018-02-05: qty 2

## 2018-02-05 MED ORDER — MIDAZOLAM HCL 2 MG/2ML IJ SOLN
INTRAMUSCULAR | Status: AC
  Filled 2018-02-05: qty 2

## 2018-02-05 MED ORDER — CEFAZOLIN SODIUM-DEXTROSE 2-4 GM/100ML-% IV SOLN
2.0000 g | INTRAVENOUS | Status: AC
  Administered 2018-02-05: 2 g via INTRAVENOUS
  Filled 2018-02-05: qty 100

## 2018-02-05 MED ORDER — OXYCODONE-ACETAMINOPHEN 5-325 MG PO TABS: 1.0000 | ORAL_TABLET | ORAL | 0 refills | Status: AC | PRN

## 2018-02-05 MED ORDER — KETOROLAC TROMETHAMINE 30 MG/ML IJ SOLN
30.0000 mg | Freq: Once | INTRAMUSCULAR | Status: AC
  Administered 2018-02-05: 30 mg via INTRAVENOUS
  Filled 2018-02-05: qty 1

## 2018-02-05 MED ORDER — BUPIVACAINE-EPINEPHRINE (PF) 0.5% -1:200000 IJ SOLN
INTRAMUSCULAR | Status: AC
  Filled 2018-02-05: qty 30

## 2018-02-05 MED ORDER — BUPIVACAINE-EPINEPHRINE (PF) 0.5% -1:200000 IJ SOLN
INTRAMUSCULAR | Status: AC
Start: 2018-02-05 — End: ?
  Filled 2018-02-05: qty 30

## 2018-02-05 MED ORDER — IBUPROFEN 800 MG PO TABS
800.0000 mg | ORAL_TABLET | Freq: Once | ORAL | Status: AC
  Administered 2018-02-05: 800 mg via ORAL
  Filled 2018-02-05: qty 1

## 2018-02-05 MED ORDER — SODIUM CHLORIDE 0.9 % IR SOLN
Status: DC | PRN
  Administered 2018-02-05: 1000 mL

## 2018-02-05 MED ORDER — MIDAZOLAM HCL 5 MG/5ML IJ SOLN
INTRAMUSCULAR | Status: DC | PRN
  Administered 2018-02-05 (×2): 1 mg via INTRAVENOUS

## 2018-02-05 MED ORDER — ONDANSETRON HCL 4 MG/2ML IJ SOLN
INTRAMUSCULAR | Status: DC | PRN
  Administered 2018-02-05: 4 mg via INTRAVENOUS

## 2018-02-05 MED ORDER — BUPIVACAINE-EPINEPHRINE (PF) 0.5% -1:200000 IJ SOLN
INTRAMUSCULAR | Status: DC | PRN
  Administered 2018-02-05 (×2): 30 mL

## 2018-02-05 MED ORDER — MEPERIDINE HCL 50 MG/ML IJ SOLN: 6.2500 mg | INTRAMUSCULAR | Status: DC | PRN

## 2018-02-05 MED ORDER — CHLORHEXIDINE GLUCONATE 4 % EX LIQD: 60.0000 mL | Freq: Once | CUTANEOUS | Status: DC

## 2018-02-05 MED ORDER — LACTATED RINGERS IV SOLN: INTRAVENOUS | Status: DC

## 2018-02-05 MED ORDER — ONDANSETRON HCL 4 MG/2ML IJ SOLN
INTRAMUSCULAR | Status: AC
  Filled 2018-02-05: qty 2

## 2018-02-05 MED ORDER — EPINEPHRINE PF 1 MG/ML IJ SOLN
INTRAMUSCULAR | Status: AC
  Filled 2018-02-05: qty 2

## 2018-02-05 MED ORDER — FENTANYL CITRATE (PF) 100 MCG/2ML IJ SOLN
INTRAMUSCULAR | Status: AC
  Filled 2018-02-05: qty 4

## 2018-02-05 MED ORDER — FENTANYL CITRATE (PF) 100 MCG/2ML IJ SOLN
INTRAMUSCULAR | Status: AC
  Filled 2018-02-05: qty 2

## 2018-02-05 MED ORDER — LACTATED RINGERS IV SOLN
INTRAVENOUS | Status: DC
  Administered 2018-02-05: 1000 mL via INTRAVENOUS

## 2018-02-05 MED ORDER — FENTANYL CITRATE (PF) 100 MCG/2ML IJ SOLN
INTRAMUSCULAR | Status: DC | PRN
  Administered 2018-02-05: 12.5 ug via INTRAVENOUS
  Administered 2018-02-05: 50 ug via INTRAVENOUS
  Administered 2018-02-05: 12.5 ug via INTRAVENOUS
  Administered 2018-02-05: 25 ug via INTRAVENOUS

## 2018-02-05 MED ORDER — PROPOFOL 10 MG/ML IV BOLUS
INTRAVENOUS | Status: AC
  Filled 2018-02-05: qty 20

## 2018-02-05 MED ORDER — PROPOFOL 10 MG/ML IV BOLUS
INTRAVENOUS | Status: DC | PRN
  Administered 2018-02-05: 180 mg via INTRAVENOUS

## 2018-02-05 MED ORDER — SODIUM CHLORIDE 0.9 % IR SOLN
Status: DC | PRN
  Administered 2018-02-05 (×3): 3000 mL

## 2018-02-05 MED ORDER — OXYCODONE-ACETAMINOPHEN 5-325 MG PO TABS: 1.0000 | ORAL_TABLET | ORAL | Status: DC | PRN

## 2018-02-05 MED ORDER — HYDROCODONE-ACETAMINOPHEN 7.5-325 MG PO TABS
1.0000 | ORAL_TABLET | Freq: Once | ORAL | Status: AC | PRN
  Administered 2018-02-05: 1 via ORAL
  Filled 2018-02-05 (×2): qty 1

## 2018-02-05 MED ORDER — HYDROMORPHONE HCL 1 MG/ML IJ SOLN
0.2500 mg | INTRAMUSCULAR | Status: DC | PRN
  Administered 2018-02-05 (×2): 0.5 mg via INTRAVENOUS
  Filled 2018-02-05 (×2): qty 0.5

## 2018-02-05 MED ORDER — PROMETHAZINE HCL 25 MG/ML IJ SOLN: 6.2500 mg | INTRAMUSCULAR | Status: DC | PRN

## 2018-02-05 MED ORDER — LIDOCAINE HCL (CARDIAC) PF 50 MG/5ML IV SOSY
PREFILLED_SYRINGE | INTRAVENOUS | Status: DC | PRN
  Administered 2018-02-05: 40 mg via INTRAVENOUS

## 2018-02-05 SURGICAL SUPPLY — 49 items
BANDAGE ACE 6X5 VEL STRL LF (GAUZE/BANDAGES/DRESSINGS) ×4 IMPLANT
BANDAGE ELASTIC 6 LF NS (GAUZE/BANDAGES/DRESSINGS) ×3 IMPLANT
BLADE AGGRESSIVE PLUS 4.0 (BLADE) ×4 IMPLANT
BLADE SURG SZ11 CARB STEEL (BLADE) ×4 IMPLANT
BNDG CMPR MED 5X6 ELC HKLP NS (GAUZE/BANDAGES/DRESSINGS) ×2
CHLORAPREP W/TINT 26ML (MISCELLANEOUS) ×4 IMPLANT
CLOTH BEACON ORANGE TIMEOUT ST (SAFETY) ×4 IMPLANT
COOLER CRYO IC GRAV AND TUBE (ORTHOPEDIC SUPPLIES) ×4 IMPLANT
CUFF CRYO KNEE18X23 MED (MISCELLANEOUS) ×3 IMPLANT
CUFF TOURNIQUET SINGLE 34IN LL (TOURNIQUET CUFF) ×4 IMPLANT
DECANTER SPIKE VIAL GLASS SM (MISCELLANEOUS) ×8 IMPLANT
GAUZE SPONGE 4X4 12PLY STRL (GAUZE/BANDAGES/DRESSINGS) ×3 IMPLANT
GAUZE SPONGE 4X4 16PLY XRAY LF (GAUZE/BANDAGES/DRESSINGS) ×4 IMPLANT
GAUZE XEROFORM 5X9 LF (GAUZE/BANDAGES/DRESSINGS) ×4 IMPLANT
GLOVE BIOGEL PI IND STRL 7.0 (GLOVE) ×2 IMPLANT
GLOVE BIOGEL PI INDICATOR 7.0 (GLOVE) ×2
GLOVE ECLIPSE 6.5 STRL STRAW (GLOVE) ×3 IMPLANT
GLOVE SKINSENSE NS SZ8.0 LF (GLOVE) ×2
GLOVE SKINSENSE STRL SZ8.0 LF (GLOVE) ×2 IMPLANT
GLOVE SS N UNI LF 8.5 STRL (GLOVE) ×4 IMPLANT
GOWN STRL REUS W/TWL LRG LVL3 (GOWN DISPOSABLE) ×4 IMPLANT
GOWN STRL REUS W/TWL XL LVL3 (GOWN DISPOSABLE) ×4 IMPLANT
HLDR LEG FOAM (MISCELLANEOUS) ×2 IMPLANT
IV NS IRRIG 3000ML ARTHROMATIC (IV SOLUTION) ×11 IMPLANT
KIT BLADEGUARD II DBL (SET/KITS/TRAYS/PACK) ×4 IMPLANT
KIT TURNOVER CYSTO (KITS) ×4 IMPLANT
LEG HOLDER FOAM (MISCELLANEOUS) ×2
MANIFOLD NEPTUNE II (INSTRUMENTS) ×4 IMPLANT
MARKER SKIN DUAL TIP RULER LAB (MISCELLANEOUS) ×4 IMPLANT
NDL HYPO 18GX1.5 BLUNT FILL (NEEDLE) ×1 IMPLANT
NDL HYPO 21X1.5 SAFETY (NEEDLE) ×1 IMPLANT
NDL SPNL 18GX3.5 QUINCKE PK (NEEDLE) ×1 IMPLANT
NEEDLE HYPO 18GX1.5 BLUNT FILL (NEEDLE) ×4 IMPLANT
NEEDLE HYPO 21X1.5 SAFETY (NEEDLE) ×4 IMPLANT
NEEDLE SPNL 18GX3.5 QUINCKE PK (NEEDLE) ×4 IMPLANT
NS IRRIG 1000ML POUR BTL (IV SOLUTION) ×4 IMPLANT
PACK ARTHRO LIMB DRAPE STRL (MISCELLANEOUS) ×4 IMPLANT
PAD ABD 5X9 TENDERSORB (GAUZE/BANDAGES/DRESSINGS) ×4 IMPLANT
PAD ARMBOARD 7.5X6 YLW CONV (MISCELLANEOUS) ×4 IMPLANT
PADDING CAST COTTON 6X4 STRL (CAST SUPPLIES) ×4 IMPLANT
PROBE BIPOLAR 50 DEGREE SUCT (MISCELLANEOUS) ×3 IMPLANT
SET ARTHROSCOPY INST (INSTRUMENTS) ×4 IMPLANT
SET ARTHROSCOPY PUMP TUBE (IRRIGATION / IRRIGATOR) ×4 IMPLANT
SET BASIN LINEN APH (SET/KITS/TRAYS/PACK) ×4 IMPLANT
SUT ETHILON 3 0 FSL (SUTURE) ×4 IMPLANT
SYR 10ML LL (SYRINGE) ×4 IMPLANT
SYR 30ML LL (SYRINGE) ×4 IMPLANT
TUBE CONNECTING 12'X1/4 (SUCTIONS) ×3
TUBE CONNECTING 12X1/4 (SUCTIONS) ×10 IMPLANT

## 2018-02-05 NOTE — Anesthesia Preprocedure Evaluation (Signed)
Anesthesia Evaluation  Patient identified by MRN, date of birth, ID band Patient awake    Reviewed: Allergy & Precautions, H&P , NPO status , Patient's Chart, lab work & pertinent test results, reviewed documented beta blocker date and time   Airway Mallampati: II  TM Distance: >3 FB Neck ROM: full    Dental no notable dental hx. (+) Dental Advidsory Given, Teeth Intact   Pulmonary neg pulmonary ROS, Current Smoker,    Pulmonary exam normal breath sounds clear to auscultation       Cardiovascular Exercise Tolerance: Good negative cardio ROS   Rhythm:regular Rate:Normal     Neuro/Psych negative neurological ROS  negative psych ROS   GI/Hepatic negative GI ROS, Neg liver ROS, GERD  ,  Endo/Other  negative endocrine ROS  Renal/GU negative Renal ROS  negative genitourinary   Musculoskeletal   Abdominal   Peds  Hematology negative hematology ROS (+)   Anesthesia Other Findings Otherwise healthy.  Denies any cooexisting medical problems   Reproductive/Obstetrics negative OB ROS                             Anesthesia Physical Anesthesia Plan  ASA: II  Anesthesia Plan: General and General LMA   Post-op Pain Management:    Induction:   PONV Risk Score and Plan: Ondansetron  Airway Management Planned:   Additional Equipment:   Intra-op Plan:   Post-operative Plan:   Informed Consent: I have reviewed the patients History and Physical, chart, labs and discussed the procedure including the risks, benefits and alternatives for the proposed anesthesia with the patient or authorized representative who has indicated his/her understanding and acceptance.   Dental Advisory Given  Plan Discussed with: CRNA and Anesthesiologist  Anesthesia Plan Comments:         Anesthesia Quick Evaluation

## 2018-02-05 NOTE — Brief Op Note (Signed)
02/05/2018  12:33 PM  PATIENT:  Victor [name]  41 y.o. 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 The chart was reviewed and updated. The surgical site was confirmed as right knee and marked with an indelible marker.  The patient was taken to the operating room for anesthesia. After successful general anesthesia, Ancef was used as IV antibiotics.  The patient was placed in the supine position with the (right) the operative extremity in an arthroscopic leg holder and the opposite extremity in a padded leg holder.  The timeout was executed.  A lateral portal was established with an 11 blade and the scope was introduced into the joint. A diagnostic arthroscopy was performed in circumferential manner examining the entire knee joint. A medial portal was established and the diagnostic arthroscopy was repeated using a probe to palpate intra-articular structures as they were encountered.     The lateral meniscus was resected using a duckbill forceps. The meniscal fragments were removed with a motorized shaver. The meniscus was balanced with a combination of a motorized shaver and a 50 Linvatec/ConMed wand until a stable rim was obtained.  The posterior horn of the medial meniscus was  resected with a 50 degree wand until a stable rim was confirmed with a probe  The arthroscopic pump was placed on the wash mode and any excess debris was removed from the joint using suction.  60 cc of Marcaine with epinephrine was injected through the arthroscope.  The portals were closed with 3-0 nylon suture.  A sterile bandage, Ace wrap and Cryo/Cuff was placed and the Cryo/Cuff was activated. The patient was taken to the recovery room in stable condition.   PHYSICIAN ASSISTANT:   ASSISTANTS: none   ANESTHESIA:   general  EBL:  0 mL   BLOOD ADMINISTERED:none  DRAINS: none   LOCAL MEDICATIONS USED:  MARCAINE     SPECIMEN:  No Specimen  DISPOSITION OF SPECIMEN:  N/A  COUNTS:  YES  TOURNIQUET:  * Missing tourniquet times found for documented tourniquets in log: [id number] *  DICTATION: .Dragon Dictation  PLAN OF CARE: Discharge to home after PACU  PATIENT DISPOSITION:  PACU - hemodynamically stable.   Delay start of Pharmacological VTE agent (>24hrs) due to surgical blood loss or risk of bleeding: not applicable

## 2018-02-05 NOTE — Anesthesia Postprocedure Evaluation (Signed)
Anesthesia Post Note  Patient: Victor [name]  Procedure(s) Performed: RIGHT KNEE ARTHROSCOPY WITH PARTIAL MEDIAL AND LATERAL MENISECTOMY (Right Knee)  Patient location during evaluation: PACU Anesthesia Type: General Level of consciousness: awake and alert and oriented Pain management: pain level controlled Vital Signs Assessment: post-procedure vital signs reviewed and stable Respiratory status: spontaneous breathing Cardiovascular status: stable and blood pressure returned to baseline : received additional dose of zofran for nausea. Anesthetic complications: no     Last Vitals:  Vitals:   02/05/18 1238 02/05/18 1245  BP:    Pulse:  70  Resp:  12  Temp: 36.6 C   SpO2:  91%    Last Pain:  Vitals:   02/05/18 1238  TempSrc:   PainSc: 7                  Ariabella Brien

## 2018-02-05 NOTE — Discharge Instructions (Signed)
Knee Arthroscopy, Care After °Refer to this sheet in the next few weeks. These instructions provide you with information about caring for yourself after your procedure. Your health care provider [place] also give you more specific instructions. Your treatment has been planned according to current medical practices, but problems sometimes occur. Call your health care provider if you have any problems or questions after your procedure. °What can I expect after the procedure? °After the procedure, it is common to have: °· Soreness. °· Pain. ° °Follow these instructions at home: °Bathing °· Do not take baths, swim, or use a hot tub until your health care provider approves. °Incision care °· There are many different ways to close and cover an incision, including stitches, skin glue, and adhesive strips. Follow your health care provider’s instructions about: °? Incision care. °? Bandage (dressing) changes and removal. °? Incision closure removal. °· Check your incision area every day for signs of infection. Watch for: °? Redness, swelling, or pain. °? Fluid, blood, or pus. °Activity °· Avoid strenuous activities for as long as directed by your health care provider. °· Return to your normal activities as directed by your health care provider. Ask your health care provider what activities are safe for you. °· Perform range-of-motion exercises only as directed by your health care provider. °· Do not lift anything that is heavier than 10 lb (4.5 kg). °· Do not drive or operate heavy machinery while taking pain medicine. °· If you were given crutches, use them as directed by your health care provider. °Managing pain, stiffness, and swelling °· If directed, apply ice to the injured area: °? Put ice in a plastic bag. °? Place a towel between your skin and the bag. °? Leave the ice on for 20 minutes, 2-3 times per day. °· Raise the injured area above the level of your heart while you are sitting or lying down as directed by your  health care provider. °General instructions °· Keep all follow-up visits as directed by your health care provider. This is important. °· Take medicines only as directed by your health care provider. °· Do not use any tobacco products, including cigarettes, chewing tobacco, or electronic cigarettes. If you need help quitting, ask your health care provider. °· If you were given compression stockings, wear them as directed by your health care provider. These stockings help prevent blood clots and reduce swelling in your legs. °Contact a health care provider if: °· You have severe pain with any movement of your knee. °· You notice a bad smell coming from the incision or dressing. °· You have redness, swelling, or pain at the site of your incision. °· You have fluid, blood, or pus coming from your incision. °Get help right away if: °· You develop a rash. °· You have a fever. °· You have difficulty breathing or have shortness of breath. °· You develop pain in your calves or in the back of your knee. °· You develop chest pain. °· You develop numbness or tingling in your leg or foot. °This information is not intended to replace advice given to you by your health care provider. Make sure you discuss any questions you have with your health care provider. °Document Released: 03/01/2005 Document Revised: 01/12/2016 Document Reviewed: 08/08/2014 °Elsevier Interactive Patient Education © 2018 [employer] ° °

## 2018-02-05 NOTE — Interval H&P Note (Signed)
History and Physical Interval Note:  02/05/2018 10:57 AM  Victor [name]  has presented today for surgery, with the diagnosis of medial and lateral meniscus tears right knee  The various methods of treatment have been discussed with the patient and family. After consideration of risks, benefits and other options for treatment, the patient has consented to  Procedure(s): ARTHROSCOPY KNEE with lateral and medial meniscectomy (Right) as a surgical intervention .  The patient's history has been reviewed, patient examined, no change in status, stable for surgery.  I have reviewed the patient's chart and labs.  Questions were answered to the patient's satisfaction.     Arther Abbott

## 2018-02-05 NOTE — Anesthesia Procedure Notes (Signed)
Procedure Name: LMA Insertion Date/Time: 02/05/2018 11:44 AM Performed by: Ollen Bowl, CRNA Pre-anesthesia Checklist: Patient identified, Patient being monitored, Emergency Drugs available, Timeout performed and Suction available Patient Re-evaluated:Patient Re-evaluated prior to induction Oxygen Delivery Method: Circle System Utilized Preoxygenation: Pre-oxygenation with 100% oxygen Induction Type: IV induction Ventilation: Mask ventilation without difficulty LMA: LMA inserted LMA Size: 4.0 Number of attempts: 1 Placement Confirmation: positive ETCO2 and breath sounds checked- equal and bilateral

## 2018-02-05 NOTE — Op Note (Signed)
02/05/2018  12:33 PM  PATIENT:  Victor [name]  41 y.o. 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 The chart was reviewed and updated. The surgical site was confirmed as right knee and marked with an indelible marker.  The patient was taken to the operating room for anesthesia. After successful general anesthesia, Ancef was used as IV antibiotics.  The patient was placed in the supine position with the (right) the operative extremity in an arthroscopic leg holder and the opposite extremity in a padded leg holder.  The timeout was executed.  A lateral portal was established with an 11 blade and the scope was introduced into the joint. A diagnostic arthroscopy was performed in circumferential manner examining the entire knee joint. A medial portal was established and the diagnostic arthroscopy was repeated using a probe to palpate intra-articular structures as they were encountered.     The lateral meniscus was resected using a duckbill forceps. The meniscal fragments were removed with a motorized shaver. The meniscus was balanced with a combination of a motorized shaver and a 50 Linvatec/ConMed wand until a stable rim was obtained.  The posterior horn of the medial meniscus was  resected with a 50 degree wand until a stable rim was confirmed with a probe  The arthroscopic pump was placed on the wash mode and any excess debris was removed from the joint using suction.  60 cc of Marcaine with epinephrine was injected through the arthroscope.  The portals were closed with 3-0 nylon suture.  A sterile bandage, Ace wrap and Cryo/Cuff was placed and the Cryo/Cuff was activated. The patient was taken to the recovery room in stable condition.   PHYSICIAN ASSISTANT:   ASSISTANTS: none   ANESTHESIA:   general  EBL:  0 mL   BLOOD ADMINISTERED:none  DRAINS: none   LOCAL MEDICATIONS USED:  MARCAINE     SPECIMEN:  No Specimen  DISPOSITION OF SPECIMEN:  N/A  COUNTS:  YES  TOURNIQUET:  * Missing tourniquet times found for documented tourniquets in log: [id number] *  DICTATION: .Dragon Dictation  PLAN OF CARE: Discharge to home after PACU  PATIENT DISPOSITION:  PACU - hemodynamically stable.   Delay start of Pharmacological VTE agent (>24hrs) due to surgical blood loss or risk of bleeding: not applicable

## 2018-02-05 NOTE — Telephone Encounter (Signed)
Victor [name] had surgery earlier today.  I spoke to pt's wife to give them a follow up appointment date and time.  While speaking to her, she states that Victor [name] needs something for nausea.  She states he uses [facility] on [address]

## 2018-02-05 NOTE — Transfer of Care (Signed)
Immediate Anesthesia Transfer of Care Note  Patient: Victor [name]  Procedure(s) Performed: RIGHT KNEE ARTHROSCOPY WITH PARTIAL MEDIAL AND LATERAL MENISECTOMY (Right Knee)  Patient Location: PACU  Anesthesia Type:General  Level of Consciousness: awake, alert  and oriented  Airway & Oxygen Therapy: Patient Spontanous Breathing  Post-op Assessment: Report given to RN  Post vital signs: Reviewed and stable  Last Vitals:  Vitals Value Taken Time  BP    Temp    Pulse 79 02/05/2018 12:38 PM  Resp 9 02/05/2018 12:38 PM  SpO2 94 % 02/05/2018 12:38 PM  Vitals shown include unvalidated device data.  Last Pain:  Vitals:   02/05/18 1035  TempSrc: Oral  PainSc: 7       Patients Stated Pain Goal: 8 (15/87/27 6184)  Complications: No apparent anesthesia complications

## 2018-02-06 ENCOUNTER — Other Ambulatory Visit: Payer: Self-pay | Admitting: Orthopedic Surgery

## 2018-02-06 ENCOUNTER — Encounter (HOSPITAL_COMMUNITY): Payer: Self-pay | Admitting: Orthopedic Surgery

## 2018-02-06 DIAGNOSIS — R112 Nausea with vomiting, unspecified: Secondary | ICD-10-CM

## 2018-02-06 DIAGNOSIS — Z9889 Other specified postprocedural states: Principal | ICD-10-CM

## 2018-02-06 MED ORDER — PROMETHAZINE HCL 12.5 MG PO TABS: 12.5000 mg | ORAL_TABLET | Freq: Four times a day (QID) | ORAL | 0 refills | Status: DC | PRN

## 2018-02-13 ENCOUNTER — Encounter: Payer: Self-pay | Admitting: Orthopedic Surgery

## 2018-02-13 ENCOUNTER — Ambulatory Visit (INDEPENDENT_AMBULATORY_CARE_PROVIDER_SITE_OTHER): Payer: Medicaid Other | Admitting: Orthopedic Surgery

## 2018-02-13 VITALS — BP 141/89 | HR 90 | Ht 71.0 in | Wt 246.0 lb

## 2018-02-13 DIAGNOSIS — M23321 Other meniscus derangements, posterior horn of medial meniscus, right knee: Secondary | ICD-10-CM

## 2018-02-13 DIAGNOSIS — M23300 Other meniscus derangements, unspecified lateral meniscus, right knee: Secondary | ICD-10-CM

## 2018-02-13 DIAGNOSIS — Z9889 Other specified postprocedural states: Secondary | ICD-10-CM

## 2018-02-13 MED ORDER — IBUPROFEN 800 MG PO TABS: 800.0000 mg | ORAL_TABLET | Freq: Three times a day (TID) | ORAL | 0 refills | Status: DC

## 2018-02-13 NOTE — Addendum Note (Signed)
Addended byCandice Camp on: 02/13/2018 10:26 AM   Modules accepted: Orders

## 2018-02-13 NOTE — Progress Notes (Signed)
Postop visit #1  Postop day 8  Status post arthroscopy right knee partial medial lateral meniscectomy  Operative findings included a small tear of the lateral meniscus at the mid body and a posterior horn medial meniscus tear with partial-thickness cartilage loss in the femoral condyle a large fissure in the trochlea deficient chronic ACL tear that scarred to the PCL as we noted on prior arthroscopy visits  Procedure right knee arthroscopy partial medial lateral meniscectomy code 29880  The patient has weak quadriceps extension  He is able to ambulate without assistive device  His portal sites were clean dry and intact he had a small effusion he can bend the knee 95 degrees  We recommend home exercises for 4 weeks  Out of work the next 4 weeks  Continue ibuprofen for pain and follow-up in 4 weeks  Encounter Diagnoses  Name Primary?  . S/P right knee arthroscopy 02/05/18 Yes  . Meniscus, lateral, derangement, right   . Derangement of posterior horn of medial meniscus of right knee

## 2018-02-18 DIAGNOSIS — G44209 Tension-type headache, unspecified, not intractable: Secondary | ICD-10-CM | POA: Diagnosis not present

## 2018-02-18 DIAGNOSIS — H40033 Anatomical narrow angle, bilateral: Secondary | ICD-10-CM | POA: Diagnosis not present

## 2018-03-03 ENCOUNTER — Encounter: Payer: Self-pay | Admitting: Family Medicine

## 2018-03-03 ENCOUNTER — Ambulatory Visit: Payer: Medicaid Other | Admitting: Family Medicine

## 2018-03-03 VITALS — BP 139/89 | HR 77 | Temp 97.4°F | Ht 71.0 in | Wt 238.0 lb

## 2018-03-03 DIAGNOSIS — R0789 Other chest pain: Secondary | ICD-10-CM

## 2018-03-03 DIAGNOSIS — F17200 Nicotine dependence, unspecified, uncomplicated: Secondary | ICD-10-CM | POA: Diagnosis not present

## 2018-03-03 DIAGNOSIS — Z125 Encounter for screening for malignant neoplasm of prostate: Secondary | ICD-10-CM

## 2018-03-03 DIAGNOSIS — Z7689 Persons encountering health services in other specified circumstances: Secondary | ICD-10-CM | POA: Diagnosis not present

## 2018-03-03 DIAGNOSIS — I10 Essential (primary) hypertension: Secondary | ICD-10-CM

## 2018-03-03 DIAGNOSIS — Z131 Encounter for screening for diabetes mellitus: Secondary | ICD-10-CM

## 2018-03-03 DIAGNOSIS — Z130 Encounter for screening for diseases of the blood and blood-forming organs and certain disorders involving the immune mechanism: Secondary | ICD-10-CM | POA: Diagnosis not present

## 2018-03-03 DIAGNOSIS — Z114 Encounter for screening for human immunodeficiency virus [HIV]: Secondary | ICD-10-CM | POA: Diagnosis not present

## 2018-03-03 LAB — BAYER DCA HB A1C WAIVED: HB A1C (BAYER DCA - WAIVED): 5.4 % (ref <7.0)

## 2018-03-03 MED ORDER — VARENICLINE TARTRATE 0.5 MG X 11 & 1 MG X 42 PO MISC: ORAL | 0 refills | Status: DC

## 2018-03-03 MED ORDER — HYDROCHLOROTHIAZIDE 25 MG PO TABS: 25.0000 mg | ORAL_TABLET | Freq: Every day | ORAL | 0 refills | Status: DC

## 2018-03-03 NOTE — Progress Notes (Signed)
Subjective: KP:[id number] care, dizziness HPI: Victor [name] is a 42 y.o. male presenting to clinic today for:  1. Dizziness Patient reports intermittent dizziness.  He notes that he has had elevated blood pressures at the orthopedic office.  He does feel like he has intermittent blurry vision.  He denies symptoms currently.  He also notes intermittent chest pain that is random and not exacerbated by physical activity.  Denies any associated shortness of breath.  He sometimes does feel nauseous during episodes.  Denies diaphoresis.  He notes that in 2012 he was actually hospitalized for ACS rule out in [facility].  He was told to follow-up with cardiology outpatient but notes that he never did.  Last episode of chest pain was about 2-3 weeks ago.  Family history significant for "light heart attack" in his mother in her 37s.  Both parents had history of hypertension.  There is also cardiovascular disease in his maternal grandfather who underwent several stents.  He is an active every day smoker see below.  2.  Tobacco use disorder Patient has been a 1 pack/day smoker for about 15 years.  He has tried nicotine patches of unknown strength in the past with no success.  He is interested in smoking cessation.  No known seizure disorder.  Family history significant for throat cancer in his father.  3. Preventative care Patient has not seen a primary care physician in greater than 8 years.  He has never been screened for HIV.  He has never had eval for prostate cancer.  Denies any BPH symptoms, including nocturia, urinary frequency and urinary hesitancy.  He would like full lab work-up.  Last tetanus shot unknown.  Past Medical History:  Diagnosis Date  . GERD (gastroesophageal reflux disease)    takes over the counter meds  . History of stomach ulcers    Past Surgical History:  Procedure Laterality Date  . APPENDECTOMY    . KNEE ARTHROSCOPY WITH MEDIAL MENISECTOMY Right 05/29/2017   Procedure:  KNEE ARTHROSCOPY WITH MEDIAL MENISECTOMY and lateral meniscectomy;  Surgeon: [name], MD;  Location: [facility];  Service: Orthopedics;  Laterality: Right;  . KNEE ARTHROSCOPY WITH MEDIAL MENISECTOMY Right 02/05/2018   Procedure: RIGHT KNEE ARTHROSCOPY WITH PARTIAL MEDIAL AND LATERAL MENISECTOMY;  Surgeon: [name], MD;  Location: [facility];  Service: Orthopedics;  Laterality: Right;   Social History   Socioeconomic History  . Marital status: Married    Spouse name: Not on file  . Number of children: 3  . Years of education: Not on file  . Highest education level: Not on file  Occupational History  . Not on file  Social Needs  . Financial resource strain: Not on file  . Food insecurity:    Worry: Not on file    Inability: Not on file  . Transportation needs:    Medical: Not on file    Non-medical: Not on file  Tobacco Use  . Smoking status: Current Every Day Smoker    Packs/day: 1.00    Years: 15.00    Pack years: 15.00    Types: Cigarettes  . Smokeless tobacco: Never Used  Substance and Sexual Activity  . Alcohol use: No  . Drug use: No  . Sexual activity: Yes    Comment: last coitus 1 week ago  Lifestyle  . Physical activity:    Days per week: Not on file    Minutes per session: Not on file  . Stress: Not on file  Relationships  . Social connections:    Talks on phone: Not on file    Gets together: Not on file    Attends religious service: Not on file    Active member of club or organization: Not on file    Attends meetings of clubs or organizations: Not on file    Relationship status: Not on file  . Intimate partner violence:    Fear of current or ex partner: Not on file    Emotionally abused: Not on file    Physically abused: Not on file    Forced sexual activity: Not on file  Other Topics Concern  . Not on file  Social History Narrative  . Not on file   Current Meds  Medication Sig  . ibuprofen (ADVIL,MOTRIN) 800 MG tablet Take 1 tablet  (800 mg total) by mouth 3 (three) times daily.   Family History  Problem Relation Age of Onset  . Diabetes Mother   . Heart failure Mother   . Cancer Mother   . Pancreatic cancer Mother   . Cancer Father   . Throat cancer Father   . Healthy Sister   . Head & neck cancer Brother   . Bone cancer Maternal Grandmother   . Diabetes Maternal Grandmother   . CAD Maternal Grandfather   . Brain cancer Maternal Grandfather   . Colon cancer Paternal Grandfather    No Known Allergies   Health Maintenance: ?TDap  ROS: Per HPI  Objective: Office vital signs reviewed. BP 139/89   Pulse 77   Temp (!) 97.4 F (36.3 C) (Oral)   Ht '5\' 11"'  (1.803 m)   Wt 238 lb (108 kg)   BMI 33.19 kg/m   Physical Examination:  General: Awake, alert, well nourished, well appearing male, No acute distress HEENT: Normal    Neck: No masses palpated. No lymphadenopathy; no carotid bruits. No JVD    Ears: Tympanic membranes intact, normal light reflex, no erythema, no bulging    Eyes: PERRLA, extraocular movement in tact, sclera white    Nose: nasal turbinates moist, no nasal discharge    Throat: moist mucus membranes, no erythema, no tonsillar exudate.  Airway is patent Cardio: regular rate and rhythm, S1S2 heard, no murmurs appreciated Pulm: clear to auscultation bilaterally, no wheezes, rhonchi or rales; normal work of breathing on room air Extremities: warm, well perfused, No edema, cyanosis or clubbing; +2 pulses bilaterally MSK: normal gait and normal station Skin: Many tattoos.  Dry; intact; no rashes or lesions Neuro: No focal neurologic deficits. Psych: Mood stable, speech normal, affect appropriate, pleasant, interactive. Depression screen PHQ 2/9 03/03/2018  Decreased Interest 0  Down, Depressed, Hopeless 0  PHQ - 2 Score 0     Assessment/ Plan: 42 y.o. male   1. Essential hypertension Newly diagnosed.  I reviewed his last several orthopedic office visit notes which revealed persistently  elevated blood pressures on 4-5 separate occasions.  Will start hydrochlorothiazide 25 mg daily.  Check CMP and lipid panel.  Tobacco cessation recommended see below.  He will follow-up in 2 weeks for blood pressure recheck. - Lipid Panel - CMP14+EGFR  2. Tobacco use disorder Patient is in the action phase of smoking cessation.  He would like to start Chantix today.  Chantix has been prescribed.  He will start the medication on Saturday since we are also starting an antihypertensive as above.  He will follow-up in 2 weeks for full physical exam and recheck.  3. Atypical chest pain Atypical.  Per his report, he was recommended to see cardiology.  Will check labs as below.  Referral to cardiology in [facility] for at minimum stress testing given family history of early cardiovascular disease and atypical symptoms. - Lipid Panel - CMP14+EGFR - TSH - Ambulatory referral to Cardiology  4. Screening for deficiency anemia - CBC with Differential  5. Screening for diabetes mellitus - Bayer DCA Hb A1c Waived  6. Screening for malignant neoplasm of prostate Asymptomatic. - PSA  7. Screening for HIV (human immunodeficiency virus) - HIV antibody (with reflex)  8. Establishing care with new doctor, encounter for    [name], [place] [phone]

## 2018-03-03 NOTE — Patient Instructions (Signed)
You had labs performed today.  You will be contacted with the results of the labs once they are available, usually in the next 3 business days for routine lab work.    Smoking Tobacco Information Smoking tobacco will very likely harm your health. Tobacco contains a poisonous (toxic), colorless chemical called nicotine. Nicotine affects the brain and makes tobacco addictive. This change in your brain can make it hard to stop smoking. Tobacco also has other toxic chemicals that can hurt your body and raise your risk of many cancers. How can smoking tobacco affect me? Smoking tobacco can increase your chances of having serious health conditions, such as:  Cancer. Smoking is most commonly associated with lung cancer, but can lead to cancer in other parts of the body.  Chronic obstructive pulmonary disease (COPD). This is a long-term lung condition that makes it hard to breathe. It also gets worse over time.  High blood pressure (hypertension), heart disease, stroke, or heart attack.  Lung infections, such as pneumonia.  Cataracts. This is when the lenses in the eyes become clouded.  Digestive problems. This [place] include peptic ulcers, heartburn, and gastroesophageal reflux disease (GERD).  Oral health problems, such as gum disease and tooth loss.  Loss of taste and smell.  Smoking can affect your appearance by causing:  Wrinkles.  Yellow or stained teeth, fingers, and fingernails.  Smoking tobacco can also affect your social life.  Many workplaces, [employer], hotels, and public places are tobacco-free. This means that you Nale experience challenges in finding places to smoke when away from home.  The cost of a smoking habit can be expensive. Expenses for someone who smokes come in two ways: ? You spend money on a regular basis to buy tobacco. ? Your health care costs in the long-term are higher if you smoke.  Tobacco smoke can also affect the health of those around you. Children of  smokers have greater chances of: ? Sudden infant death syndrome (SIDS). ? Ear infections. ? Lung infections.  What lifestyle changes can be made?  Do not start smoking. Quit if you already do.  To quit smoking: ? Make a plan to quit smoking and commit yourself to it. Look for programs to help you and ask your health care provider for recommendations and ideas. ? Talk with your health care provider about using nicotine replacement medicines to help you quit. Medicine replacement medicines include gum, lozenges, patches, sprays, or pills. ? Do not replace cigarette smoking with electronic cigarettes, which are commonly called e-cigarettes. The safety of e-cigarettes is not known, and some Lady contain harmful chemicals. ? Avoid places, people, or situations that tempt you to smoke. ? If you try to quit but return to smoking, don't give up hope. It is very common for people to try a number of times before they fully succeed. When you feel ready again, give it another try.  Quitting smoking might affect the way you eat as well as your weight. Be prepared to monitor your eating habits. Get support in planning and following a healthy diet.  Ask your health care provider about having regular tests (screenings) to check for cancer. This [place] include blood tests, imaging tests, and other tests.  Exercise regularly. Consider taking walks, joining a gym, or doing yoga or exercise classes.  Develop skills to manage your stress. These skills include meditation. What are the benefits of quitting smoking? By quitting smoking, you [place]:  Lower your risk of getting cancer and other diseases caused  by smoking.  Live longer.  Breathe better.  Lower your blood pressure and heart rate.  Stop your addiction to tobacco.  Stop creating secondhand smoke that hurts other people.  Improve your sense of taste and smell.  Look better over time, due to having fewer wrinkles and less staining.  What can  happen if changes are not made? If you do not stop smoking, you [place]:  Get cancer and other diseases.  Develop COPD or other long-term (chronic) lung conditions.  Develop serious problems with your heart and blood vessels (cardiovascular system).  Need more tests to screen for problems caused by smoking.  Have higher, long-term healthcare costs from medicines or treatments related to smoking.  Continue to have worsening changes in your lungs, mouth, and nose.  Where to find support: To get support to quit smoking, consider:  Asking your health care provider for more information and resources.  Taking classes to learn more about quitting smoking.  Looking for local organizations that offer resources about quitting smoking.  Joining a support group for people who want to quit smoking in your local community.  Where to find more information: You [place] find more information about quitting smoking from:  HelpGuide.org: www.helpguide.org/articles/addictions/how-to-quit-smoking.htm  https://hall.com/: smokefree.gov  American Lung Association: www.lung.org  Contact a health care provider if:  You have problems breathing.  Your lips, nose, or fingers turn blue.  You have chest pain.  You are coughing up blood.  You feel faint or you pass out.  You have other noticeable changes that cause you to worry. Summary  Smoking tobacco can negatively affect your health, the health of those around you, your finances, and your social life.  Do not start smoking. Quit if you already do. If you need help quitting, ask your health care provider.  Think about joining a support group for people who want to quit smoking in your local community. There are many effective programs that will help you to quit this behavior. This information is not intended to replace advice given to you by your health care provider. Make sure you discuss any questions you have with your health care  provider. Document Released: 08/27/2016 Document Revised: 08/27/2016 Document Reviewed: 08/27/2016 Elsevier Interactive Patient Education  [employer].

## 2018-03-03 NOTE — Progress Notes (Signed)
0

## 2018-03-04 ENCOUNTER — Other Ambulatory Visit: Payer: Self-pay | Admitting: Family Medicine

## 2018-03-04 LAB — CMP14+EGFR
A/G RATIO: 1.7 (ref 1.2–2.2)
ALT: 29 IU/L (ref 0–44)
AST: 27 IU/L (ref 0–40)
Albumin: 4.8 g/dL (ref 3.5–5.5)
Alkaline Phosphatase: 88 IU/L (ref 39–117)
BUN/Creatinine Ratio: 12 (ref 9–20)
BUN: 12 mg/dL (ref 6–24)
Bilirubin Total: 0.4 mg/dL (ref 0.0–1.2)
CALCIUM: 9.7 mg/dL (ref 8.7–10.2)
CO2: 24 mmol/L (ref 20–29)
Chloride: 103 mmol/L (ref 96–106)
Creatinine, Ser: 1 mg/dL (ref 0.76–1.27)
GFR calc non Af Amer: 92 mL/min/{1.73_m2} (ref >59)
GFR, EST AFRICAN AMERICAN: 106 mL/min/{1.73_m2} (ref >59)
GLUCOSE: 81 mg/dL (ref 65–99)
Globulin, Total: 2.9 g/dL (ref 1.5–4.5)
POTASSIUM: 4.2 mmol/L (ref 3.5–5.2)
Sodium: 141 mmol/L (ref 134–144)
TOTAL PROTEIN: 7.7 g/dL (ref 6.0–8.5)

## 2018-03-04 LAB — CBC WITH DIFFERENTIAL/PLATELET
Basophils Absolute: 0 10*3/uL (ref 0.0–0.2)
Basos: 0 %
EOS (ABSOLUTE): 0.1 10*3/uL (ref 0.0–0.4)
Eos: 1 %
HEMOGLOBIN: 15.2 g/dL (ref 13.0–17.7)
Hematocrit: 42.3 % (ref 37.5–51.0)
IMMATURE GRANS (ABS): 0 10*3/uL (ref 0.0–0.1)
IMMATURE GRANULOCYTES: 0 %
LYMPHS: 30 %
Lymphocytes Absolute: 2.4 10*3/uL (ref 0.7–3.1)
MCH: 30.6 pg (ref 26.6–33.0)
MCHC: 35.9 g/dL — ABNORMAL HIGH (ref 31.5–35.7)
MCV: 85 fL (ref 79–97)
MONOCYTES: 6 %
Monocytes Absolute: 0.5 10*3/uL (ref 0.1–0.9)
NEUTROS ABS: 4.9 10*3/uL (ref 1.4–7.0)
NEUTROS PCT: 63 %
PLATELETS: 251 10*3/uL (ref 150–450)
RBC: 4.97 x10E6/uL (ref 4.14–5.80)
RDW: 14.2 % (ref 12.3–15.4)
WBC: 8 10*3/uL (ref 3.4–10.8)

## 2018-03-04 LAB — PSA: PROSTATE SPECIFIC AG, SERUM: 0.2 ng/mL (ref 0.0–4.0)

## 2018-03-04 LAB — LIPID PANEL
CHOLESTEROL TOTAL: 249 mg/dL — AB (ref 100–199)
Chol/HDL Ratio: 6.1 ratio — ABNORMAL HIGH (ref 0.0–5.0)
HDL: 41 mg/dL (ref >39)
LDL CALC: 169 mg/dL — AB (ref 0–99)
Triglycerides: 193 mg/dL — ABNORMAL HIGH (ref 0–149)
VLDL Cholesterol Cal: 39 mg/dL (ref 5–40)

## 2018-03-04 LAB — HIV ANTIBODY (ROUTINE TESTING W REFLEX): HIV SCREEN 4TH GENERATION: NONREACTIVE

## 2018-03-04 LAB — TSH: TSH: 1.24 u[IU]/mL (ref 0.450–4.500)

## 2018-03-04 MED ORDER — ATORVASTATIN CALCIUM 40 MG PO TABS: 40.0000 mg | ORAL_TABLET | Freq: Every day | ORAL | 1 refills | Status: DC

## 2018-03-05 ENCOUNTER — Telehealth: Payer: Self-pay | Admitting: *Deleted

## 2018-03-05 NOTE — Telephone Encounter (Signed)
Pt notified of results Verbalizes understanding 

## 2018-03-13 ENCOUNTER — Encounter: Payer: Self-pay | Admitting: Orthopedic Surgery

## 2018-03-13 ENCOUNTER — Ambulatory Visit (INDEPENDENT_AMBULATORY_CARE_PROVIDER_SITE_OTHER): Payer: Medicaid Other | Admitting: Orthopedic Surgery

## 2018-03-13 VITALS — BP 124/80 | HR 75 | Ht 71.5 in | Wt 240.0 lb

## 2018-03-13 DIAGNOSIS — Z9889 Other specified postprocedural states: Secondary | ICD-10-CM

## 2018-03-13 NOTE — Patient Instructions (Signed)
Work on knee extension lying on your stomach

## 2018-03-13 NOTE — Progress Notes (Signed)
POSTOP VISIT  POD #36  Chief Complaint  Patient presents with  . Follow-up    R knee Sx 02/05/18    41-second knee scope for new tear right knee had a partial medial meniscectomy partial lateral meniscectomy and old ACL tear with large fissure in the trochlea complains of difficulty squatting going up stairs and also having trouble extending the knee.  Gets pain when he tries to extend it although active and passive motion reveal full extension.   Encounter Diagnosis  Name Primary?  . S/P right knee arthroscopy 02/05/18 Yes    Small knee effusion holds the knee in about 25 degrees of flexion I can get his knee to full extension passively he has good flexion knee has minimal if any laxity  Postoperative plan (Work, WB, No orders of the defined types were placed in this encounter. ,FU)  Follow-up 1 month Continue home exercises Refill ibuprofen Continue out of work status

## 2018-03-19 ENCOUNTER — Encounter: Payer: Self-pay | Admitting: Cardiology

## 2018-03-19 NOTE — Progress Notes (Signed)
Cardiology Office Note  Date: 03/23/2018   ID: Victor [name], DOB [birth year], MRN [id number]  PCP: [name], DO  Consulting Cardiologist: Rozann Lesches, MD   Chief Complaint  Patient presents with  . Chest Pain    History of Present Illness: Victor [name] is a 42 y.o. male referred for cardiology consultation by Dr. Lajuana Ripple for the evaluation of chest pain.  He presents describing intermittent chest tightness, sometimes associated with nausea or discomfort in his left arm.  This can occur with activity but also at rest and no obvious precipitant.  Also reports dyspnea on exertion.  He states that [place] cardiac testing at that point and was found to have "mild blockages."  He does not recall specifics.  He does have a family history of premature CAD in both parents.  He has a personal history of hyperlipidemia and is currently on Lipitor.  Recent LDL 169.  He also has a history of tobacco abuse.  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  I personally reviewed his ECG today which shows normal sinus rhythm.  Past Medical History:  Diagnosis Date  . GERD (gastroesophageal reflux disease)   . History of stomach ulcers     Past Surgical History:  Procedure Laterality Date  . APPENDECTOMY    . KNEE ARTHROSCOPY WITH MEDIAL MENISECTOMY Right 05/29/2017   Procedure: KNEE ARTHROSCOPY WITH MEDIAL MENISECTOMY and lateral meniscectomy;  Surgeon: [name], MD;  Location: [facility];  Service: Orthopedics;  Laterality: Right;  . KNEE ARTHROSCOPY WITH MEDIAL MENISECTOMY Right 02/05/2018   Procedure: RIGHT KNEE ARTHROSCOPY WITH PARTIAL MEDIAL AND LATERAL MENISECTOMY;  Surgeon: [name], MD;  Location: [facility];  Service: Orthopedics;  Laterality: Right;    Current Outpatient Medications  Medication Sig Dispense Refill  . atorvastatin  (LIPITOR) 40 MG tablet Take 1 tablet (40 mg total) by mouth daily. 90 tablet 1  . ibuprofen (ADVIL,MOTRIN) 800 MG tablet Take 1 tablet (800 mg total) by mouth 3 (three) times daily. (Patient taking differently: Take 800 mg by mouth every 8 (eight) hours as needed. ) 90 tablet 0  . varenicline (CHANTIX STARTING MONTH PAK) 0.5 MG X 11 & 1 MG X 42 tablet Take 0.5 mg tablet by mouth daily x3 days, then increase to one 0.5 mg tablet 2x daily x4 days, then increase to one 1 mg tablet 2x daily. 53 tablet 0  . hydrochlorothiazide (HYDRODIURIL) 25 MG tablet Take 1 tablet (25 mg total) by mouth daily. (Patient not taking: Reported on 03/23/2018) 30 tablet 0   No current facility-administered medications for this visit.    Allergies:  Patient has no known allergies.   Social History: The patient  reports that he has been smoking cigarettes.  He started smoking about 26 years ago. He has a 15.00 pack-year smoking history. He has never used smokeless tobacco. He reports that he does not drink alcohol or use drugs.   Family History: The patient's family history includes Bone cancer in his maternal grandmother; Brain cancer in his maternal grandfather; CAD in his maternal grandfather; Cancer in his father and mother; Colon cancer in his paternal grandfather; Diabetes in his maternal grandmother and mother; Head & neck cancer in his brother; Healthy in his sister; Heart failure in his mother; Pancreatic cancer in his mother; Throat cancer in his father.   ROS:  Please  see the history of present illness. Otherwise, complete review of systems is positive for none.  All other systems are reviewed and negative.   Physical Exam: VS:  BP 129/88 (BP Location: Right Arm, Cuff Size: Large)   Pulse 74   Ht 5\' 11"  (1.803 m)   Wt 241 lb 12.8 oz (109.7 kg)   SpO2 96%   BMI 33.72 kg/m , BMI Body mass index is 33.72 kg/m.  Wt Readings from Last 3 Encounters:  03/23/18 241 lb 12.8 oz (109.7 kg)  03/13/18 240 lb (108.9 kg)   03/03/18 238 lb (108 kg)    General: Patient appears comfortable at rest. HEENT: Conjunctiva and lids normal, oropharynx clear. Neck: Supple, no elevated JVP or carotid bruits, no thyromegaly. Lungs: Clear to auscultation, nonlabored breathing at rest. Cardiac: Regular rate and rhythm, S3, no significant systolic murmur, no pericardial rub. Abdomen: Soft, nontender, bowel sounds present. Extremities: No pitting edema, distal pulses 2+. Skin: Warm and dry.  Several tattoos noted. Musculoskeletal: No kyphosis. Neuropsychiatric: Alert and oriented x3, affect grossly appropriate.  ECG: There is no old tracing for comparison today.  Recent Labwork: 03/03/2018: ALT 29; AST 27; BUN 12; Creatinine, Ser 1.00; Hemoglobin 15.2; Platelets 251; Potassium 4.2; Sodium 141; TSH 1.240     Component Value Date/Time   CHOL 249 (H) 03/03/2018 1448   TRIG 193 (H) 03/03/2018 1448   HDL 41 03/03/2018 1448   CHOLHDL 6.1 (H) 03/03/2018 1448   LDLCALC 169 (H) 03/03/2018 1448   Assessment and Plan:  1.  Intermittent chest pain and dyspnea on exertion in a 42 year old male with family history of premature CAD in both parents as well as personal cardiac risk factors including hyperlipidemia and tobacco use.  He reports some type of cardiac evaluation back in 2012 at which time he was found to have "mild blockages."  He has not had interval ischemic testing.  Baseline ECG is normal.  He is recovering from recent arthroscopic right knee surgery and would not be able to exercise on a treadmill at this time.  We will plan a Lexiscan Myoview as well as an echocardiogram for cardiac structural and ischemic evaluation.  2.  Hyperlipidemia, recently started on Lipitor.  LDL 169.  3.  Tobacco abuse.  Smoking cessation discussed.  He is trying to quit, now on Chantix.  Current medicines were reviewed with the patient today.   Orders Placed This Encounter  Procedures  . NM Myocar Multi W/Spect W/Wall Motion / EF  .  EKG 12-Lead  . ECHOCARDIOGRAM COMPLETE    Disposition: Call with test results.  Signed, Satira Sark, MD, [facility] 03/23/2018 3:16 PM    [facility] [place], [place], [place] [address] Phone: [phone]; Fax: [phone]

## 2018-03-23 ENCOUNTER — Encounter: Payer: Self-pay | Admitting: Cardiology

## 2018-03-23 ENCOUNTER — Ambulatory Visit (INDEPENDENT_AMBULATORY_CARE_PROVIDER_SITE_OTHER): Payer: Medicaid Other | Admitting: Cardiology

## 2018-03-23 ENCOUNTER — Telehealth: Payer: Self-pay | Admitting: Cardiology

## 2018-03-23 VITALS — BP 129/88 | HR 74 | Ht 71.0 in | Wt 241.8 lb

## 2018-03-23 DIAGNOSIS — R0609 Other forms of dyspnea: Secondary | ICD-10-CM | POA: Diagnosis not present

## 2018-03-23 DIAGNOSIS — Z8249 Family history of ischemic heart disease and other diseases of the circulatory system: Secondary | ICD-10-CM

## 2018-03-23 DIAGNOSIS — Z720 Tobacco use: Secondary | ICD-10-CM | POA: Diagnosis not present

## 2018-03-23 DIAGNOSIS — E782 Mixed hyperlipidemia: Secondary | ICD-10-CM

## 2018-03-23 DIAGNOSIS — R072 Precordial pain: Secondary | ICD-10-CM | POA: Diagnosis not present

## 2018-03-23 NOTE — Addendum Note (Signed)
Addended by: Acquanetta Chain on: 03/23/2018 03:20 PM   Modules accepted: Orders

## 2018-03-23 NOTE — Telephone Encounter (Signed)
°  Precert needed for: Echo    Location: [facility]    Date: Mar 31, 2018

## 2018-03-23 NOTE — Telephone Encounter (Signed)
°  Precert needed for: lexiscan   Location: [facility]    Date: Apr 03, 2018

## 2018-03-23 NOTE — Patient Instructions (Signed)
Medication Instructions:  Your physician recommends that you continue on your current medications as directed. Please refer to the Current Medication list given to you today.  Labwork: NONE  Testing/Procedures: Your physician has requested that you have an echocardiogram. Echocardiography is a painless test that uses sound waves to create images of your heart. It provides your doctor with information about the size and shape of your heart and how well your heart's chambers and valves are working. This procedure takes approximately one hour. There are no restrictions for this procedure.  Your physician has requested that you have en exercise stress myoview. For further information please visit [email or web address]. Please follow instruction sheet, as given.  Follow-Up: Your physician recommends that you schedule a follow-up appointment PENDING TEST RESULTS   Any Other Special Instructions Will Be Listed Below (If Applicable).  If you need a refill on your cardiac medications before your next appointment, please call your pharmacy.

## 2018-03-31 ENCOUNTER — Other Ambulatory Visit: Payer: Self-pay

## 2018-03-31 ENCOUNTER — Telehealth: Payer: Self-pay | Admitting: Orthopedic Surgery

## 2018-03-31 NOTE — Telephone Encounter (Signed)
Patient and his wife called this afternoon stating that he felt like his knee was popping out.  I offered to let him see Dr. Luna Glasgow or offered that he could see Dr. Aline Brochure this coming Friday.  They opted to wait to see Dr. Aline Brochure this coming Friday at 11:10

## 2018-04-03 ENCOUNTER — Ambulatory Visit (INDEPENDENT_AMBULATORY_CARE_PROVIDER_SITE_OTHER): Payer: Medicaid Other | Admitting: Orthopedic Surgery

## 2018-04-03 ENCOUNTER — Encounter: Payer: Self-pay | Admitting: Orthopedic Surgery

## 2018-04-03 ENCOUNTER — Encounter (HOSPITAL_COMMUNITY): Payer: Self-pay

## 2018-04-03 ENCOUNTER — Encounter (HOSPITAL_COMMUNITY): Admission: RE | Admit: 2018-04-03 | Payer: Medicaid Other | Source: Ambulatory Visit

## 2018-04-03 VITALS — BP 142/90 | HR 80 | Ht 71.0 in | Wt 240.0 lb

## 2018-04-03 DIAGNOSIS — M25561 Pain in right knee: Secondary | ICD-10-CM

## 2018-04-03 DIAGNOSIS — S83511S Sprain of anterior cruciate ligament of right knee, sequela: Secondary | ICD-10-CM

## 2018-04-03 DIAGNOSIS — Z9889 Other specified postprocedural states: Secondary | ICD-10-CM

## 2018-04-03 DIAGNOSIS — M23321 Other meniscus derangements, posterior horn of medial meniscus, right knee: Secondary | ICD-10-CM

## 2018-04-03 DIAGNOSIS — G8929 Other chronic pain: Secondary | ICD-10-CM

## 2018-04-03 DIAGNOSIS — M23300 Other meniscus derangements, unspecified lateral meniscus, right knee: Secondary | ICD-10-CM

## 2018-04-03 NOTE — Progress Notes (Signed)
Chief Complaint  Patient presents with  . Post-op Problem    right knee arthroscopy 02/05/18 has been locking up this week increased pain     Encounter Diagnoses  Name Primary?  . S/P right knee arthroscopy 02/05/18   . Derangement of posterior horn of medial meniscus of right knee   . Meniscus, lateral, derangement, right   . 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

## 2018-04-09 ENCOUNTER — Telehealth: Payer: Self-pay | Admitting: Orthopedic Surgery

## 2018-04-09 NOTE — Telephone Encounter (Signed)
Not approved Renee sent additional information for them to review this am, there are not any scans available for at least a week. He should RS appt until after scan, we will call him

## 2018-04-09 NOTE — Telephone Encounter (Signed)
Patient left a message on voicemail asking if his MRI has been approved. He is scheduled to come in on Monday to review MRI. He wants to know if he needs to reschedule that appointment.  Please call and advise

## 2018-04-10 ENCOUNTER — Telehealth: Payer: Self-pay | Admitting: Radiology

## 2018-04-10 ENCOUNTER — Encounter: Payer: Self-pay | Admitting: Orthopedic Surgery

## 2018-04-10 NOTE — Telephone Encounter (Signed)
There has been a delay with the MRI scan, he needs to be out of work until his next visit will you please extend work note?

## 2018-04-13 ENCOUNTER — Ambulatory Visit: Payer: Medicaid Other | Admitting: Orthopedic Surgery

## 2018-04-14 ENCOUNTER — Encounter: Payer: Self-pay | Admitting: *Deleted

## 2018-04-20 ENCOUNTER — Ambulatory Visit (HOSPITAL_COMMUNITY)
Admission: RE | Admit: 2018-04-20 | Discharge: 2018-04-20 | Disposition: A | Discharge status: Home / Self Care | Payer: Medicaid Other | Source: Ambulatory Visit | Attending: Orthopedic Surgery | Admitting: Orthopedic Surgery

## 2018-04-20 DIAGNOSIS — M1711 Unilateral primary osteoarthritis, right knee: Secondary | ICD-10-CM | POA: Diagnosis not present

## 2018-04-20 DIAGNOSIS — M25561 Pain in right knee: Secondary | ICD-10-CM | POA: Diagnosis not present

## 2018-04-20 DIAGNOSIS — G8929 Other chronic pain: Secondary | ICD-10-CM

## 2018-04-29 ENCOUNTER — Encounter: Payer: Self-pay | Admitting: Orthopedic Surgery

## 2018-04-29 ENCOUNTER — Encounter

## 2018-04-29 ENCOUNTER — Ambulatory Visit: Payer: Medicaid Other | Admitting: Orthopedic Surgery

## 2018-04-29 VITALS — BP 165/99 | HR 81 | Ht 71.0 in | Wt 240.0 lb

## 2018-04-29 DIAGNOSIS — Z9889 Other specified postprocedural states: Secondary | ICD-10-CM | POA: Diagnosis not present

## 2018-04-29 DIAGNOSIS — S83511D Sprain of anterior cruciate ligament of right knee, subsequent encounter: Secondary | ICD-10-CM | POA: Diagnosis not present

## 2018-04-29 DIAGNOSIS — M23321 Other meniscus derangements, posterior horn of medial meniscus, right knee: Secondary | ICD-10-CM

## 2018-04-29 MED ORDER — TRAMADOL-ACETAMINOPHEN 37.5-325 MG PO TABS: 1.0000 | ORAL_TABLET | Freq: Three times a day (TID) | ORAL | 2 refills | Status: DC | PRN

## 2018-04-29 NOTE — Progress Notes (Signed)
Victor [name]  04/29/2018   Pre op   HISTORY SECTION :  Chief Complaint  Patient presents with  . Knee Pain    right    HPI The patient presents for evaluation of ongoing pain catching and locking in his right knee  The patient had exam under anesthesia and arthroscopy with medial meniscectomy and lateral meniscectomy of the right knee back in October 2018 at that time he had an anterior cruciate ligament tear which was scarred to the PCL as well as a lateral meniscal tear grade 2 to grade 3 surface lesion of the lateral femoral condyle with a fissure in the trochlea and a grade 2 patella lesion of the medial facet.  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  All other systems reviewed and are negative.   Past Medical History:  Diagnosis Date  . GERD (gastroesophageal reflux disease)   . History of stomach ulcers    Past Surgical History:  Procedure Laterality Date  . APPENDECTOMY    . KNEE ARTHROSCOPY WITH MEDIAL MENISECTOMY Right 05/29/2017   Procedure: KNEE ARTHROSCOPY WITH MEDIAL MENISECTOMY and lateral meniscectomy;  Surgeon: [name], MD;  Location: [facility];  Service: Orthopedics;  Laterality: Right;  . KNEE ARTHROSCOPY WITH MEDIAL MENISECTOMY Right 02/05/2018   Procedure: RIGHT KNEE ARTHROSCOPY WITH PARTIAL MEDIAL AND LATERAL  MENISECTOMY;  Surgeon: [name], MD;  Location: [facility];  Service: Orthopedics;  Laterality: Right;    Family History  Problem Relation Age of Onset  . Diabetes Mother   . Heart failure Mother   . Cancer Mother   . Pancreatic cancer Mother   . Cancer Father   . Throat cancer Father   . Healthy Sister   . Head & neck cancer Brother   . Bone cancer Maternal Grandmother   . Diabetes Maternal Grandmother   . CAD Maternal Grandfather   . Brain cancer Maternal Grandfather   . Colon cancer Paternal Grandfather     Social History   Tobacco Use  . Smoking status: Current Every Day Smoker    Packs/day: 1.00    Years: 15.00    Pack years: 15.00    Types: Cigarettes    Start date: 07/07/1991  . Smokeless tobacco: Never Used  Substance Use Topics  . Alcohol use: No  . Drug use: No      No Known Allergies   Current Outpatient Medications:  .  atorvastatin (LIPITOR) 40 MG tablet, Take 1 tablet (40 mg total) by mouth daily., Disp: 90 tablet, Rfl: 1 .  hydrochlorothiazide (HYDRODIURIL) 25 MG tablet, Take 1 tablet (25 mg total) by mouth daily., Disp: 30 tablet, Rfl: 0 .  ibuprofen (ADVIL,MOTRIN) 800 MG tablet, Take 1 tablet (800 mg total) by mouth 3 (three) times daily. (Patient taking differently: Take 800 mg by mouth every 8 (eight) hours as  needed. ), Disp: 90 tablet, Rfl: 0 .  varenicline (CHANTIX STARTING MONTH PAK) 0.5 MG X 11 & 1 MG X 42 tablet, Take 0.5 mg tablet by mouth daily x3 days, then increase to one 0.5 mg tablet 2x daily x4 days, then increase to one 1 mg tablet 2x daily., Disp: 53 tablet, Rfl: 0 .  traMADol-acetaminophen (ULTRACET) 37.5-325 MG tablet, Take 1 tablet by mouth every 8 (eight) hours as needed for up to 21 days., Disp: 21 tablet, Rfl: 2   PHYSICAL EXAM SECTION: BP (!) 165/99   Pulse 81   Ht 5\' 11"  (1.803 m)   Wt 240 lb (108.9 kg)   BMI 33.47 kg/m   General appearance the patient is normally developed grooming and hygiene are  normal  Oriented x3  Mood pleasant affect normal  Gait slight disturbance with altered gait favoring the right lower extremity  Extremity #1 left knee Inspection no swelling or tenderness Range of motion full Stability tests were normal Strength is 5/5 Skin was warm dry and intact without rash lesion or ulceration Pulse and perfusion normal without edema Normal sensation  Extremity #2 right knee Skin is warm dry and intact he has good distal pulse normal perfusion no edema normal sensation in the right lower extremity  He has tenderness and swelling lateral compartment primarily trace positive anterior drawer and Lachman test with stable collateral ligaments normal strength and muscle tone slight decrease in overall range of motion secondary to the fusion  MEDICAL DECISION SECTION:  Encounter Diagnoses  Name Primary?  . S/P right knee arthroscopy 02/05/18   . Derangement of posterior horn of medial meniscus of right knee   . Rupture of anterior cruciate ligament of right knee, subsequent encounter Yes    Imaging MRI correction: acl laying on the pcl (report in error) medial meniscus torn is a re-tear  MRI report FINDINGS: MENISCI   Medial meniscus: There is fluid bright signal in the posterior horn in a longitudinal orientation consistent with either a longitudinal tear or nondisplaced flap tear.   Lateral meniscus: Blunting is seen along the free edge throughout with an appearance most compatible with prior meniscectomy. No definite tear is identified.   LIGAMENTS   Cruciates:  Intact.   Collaterals:  Intact.   CARTILAGE   Patellofemoral: Cartilage thinning is seen in the central aspect of the femoral trochlea.   Medial:  Mildly thinned without focal defect.   Lateral:  Mildly thinned without focal defect.   Joint:  Small effusion.   Popliteal Fossa:  No Baker's cyst.   Extensor Mechanism:  Intact.   Bones: No fracture or worrisome lesion. There is  some osteophytosis about the knee.   Other: None.   IMPRESSION: Longitudinal tear or nondisplaced flap tear posterior horn medial meniscus.   Diminutive lateral meniscus has an appearance most consistent with prior meniscectomy. No tear is identified.   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     Electronically Signed   By: Inge Rise M.D.   On: 04/20/2018 15:29     Plan:  (Rx., Inj., surg., Frx, MRI/CT, XR:2)  I plan to do another medial meniscectomy on him but he and his significant other discussed their concerns about further instability episodes and since that is his primary complaint they would like to proceed with reconstruction of the ACL to prevent the instability.  I did tell him though that that is usually not successful in the setting of arthritic  knee and he is too young for knee replacement.  So I advised him that as long as they know that the knee will not be normal and that he will still have trouble with the knee that the ACL reconstruction can be done to prevent the instability episodes and protect the knee from further chondral damage and cartilage damage  They are agreeable to this and wished to proceed with an ACL reconstruction of the right knee  Surgery   He opted for acl reconstruction vs tka vs menisectomy, and we will go ahead with an allograft reconstruction of the knee to preserve his patellofemoral joint.  The procedure has been fully reviewed with the patient; The risks and benefits of surgery have been discussed and explained and understood. Alternative treatment has also been reviewed, questions were encouraged and answered. The postoperative plan is also been reviewed.

## 2018-04-29 NOTE — Patient Instructions (Signed)
Anterior Cruciate Ligament Tear Ligaments are strong bands of tissue that connect bones to each other. The anterior cruciate ligament (ACL) connects your shin bone to your thigh bone. A tear in this ligament can cause pain and make it hard for you to put weight on your leg (use your leg to support your body weight). There are three types of ACL injuries:  Grade 1. In this type, the ligament is stretched.  Grade 2. In this type, the ligament is partially torn.  Grade 3. In this type, the ligament is completely torn.  What are the causes? This condition happens when too much pressure is put on the ACL. It Gruver happen if:  You twist your knee, especially with your foot planted.  You make a quick change in direction (cut and pivot).  You slow down quickly while running.  You land a jump without bending your knee.  You forcefully straighten your knee more than normal (hyperextend your knee).  You are hit in the knee.  You hit your knee on the ground.  What increases the risk? This condition is more likely to develop in:  Women.  People who play sports that involve jumping or changing directions often. These include: ? Football. ? Basketball. ? Volleyball. ? Soccer. ? Skiing. ? Hockey. ? Gymnastics  What are the signs or symptoms? Symptoms of this condition include:  A popping sound at the time of injury.  A feeling that your knee is bending abnormally at the time of injury.  Pain.  Swelling.  Tenderness.  Instability when you try to put weight on your injured leg.  Inability to move your knee as far as normal.  Difficulty walking.  How is this diagnosed? This condition Jauregui be diagnosed based on:  Your symptoms.  Your medical history.  A physical exam.  Tests, such as: ? An X-ray. This Marin be done to check for bone injuries. ? MRI. This Altieri be done to see if the tear is partial or complete and to check for additional injuries.  During your physical  exam, your provider will test your knee to see if it moves more than it should (laxity). How is this treated? This condition Senger be treated with:  Resting your knee.  Avoiding activities that cause pain, instability, or swelling.  Raising (elevating) your knee while resting.  Keeping weight off your leg until pain and swelling improve. You Crite need to use crutches or a walker.  Icing your knee.  Taking medicine to reduce pain and swelling.  Wearing a knee brace.  Doing range-of-motion, strengthening, and stretching exercises (physical therapy).  Surgery. This Brenner be needed if you are very active and have a complete tear.  Follow these instructions at home: If you have a brace:  Wear it as told by your health care provider. Remove it only as told by your health care provider.  Loosen the brace if your toes tingle, become numb, or turn cold and blue.  Do not let your brace get wet if it is not waterproof.  Keep the brace clean. Managing pain, stiffness, and swelling  If directed, put ice on your knee: ? Put ice in a plastic bag. ? Place a towel between your skin and the bag. ? Leave the ice on for 20 minutes, 2-3 times a day.  Move your foot often to avoid stiffness and to lessen swelling.  Elevate your knee above the level of your heart while you are sitting or lying down. Driving  Do not drive or operate heavy machinery while taking prescription pain medicine.  Ask your health care provider when it is safe to drive if you have a brace on your leg. Activity  Return to your normal activities as told by your health care provider. Ask your health care provider what activities are safe for you.  Do exercises as told by your health care provider. General instructions  Do not use the injured limb to support your body weight until your health care provider says that you can. Use crutches or a walker as told by your health care provider.  Do not use any tobacco  products, such as cigarettes, chewing tobacco, and e-cigarettes. Tobacco can delay healing. If you need help quitting, ask your health care provider.  Take over-the-counter and prescription medicines only as told by your health care provider.  Keep all follow-up visits as told by your health care provider. This is important. How is this prevented?  Warm up and stretch before being active.  Cool down and stretch after being active.  Give your body time to rest between periods of activity.  Make sure to use equipment that fits you.  If you wear cleats, make sure they are appropriate for your playing surface.  Be safe and responsible while being active to avoid falls.  Maintain physical fitness, including: ? Strength. ? Flexibility. Contact a health care provider if:  Your symptoms are not improving.  Your symptoms are getting worse. This information is not intended to replace advice given to you by your health care provider. Make sure you discuss any questions you have with your health care provider. Document Released: 03/13/2005 Document Revised: 04/16/2016 Document Reviewed: 07/29/2015 Elsevier Interactive Patient Education  2017 [employer].

## 2018-04-29 NOTE — Addendum Note (Signed)
Addended byCandice Camp on: 04/29/2018 02:23 PM   Modules accepted: Orders, SmartSet

## 2018-05-06 ENCOUNTER — Ambulatory Visit: Payer: Medicaid Other | Admitting: Orthopedic Surgery

## 2018-05-13 NOTE — Patient Instructions (Signed)
Victor [name]  05/13/2018     @PREFPERIOPPHARMACY @   Your procedure is scheduled on  05/19/2018   Report to [facility] at  700  A.M.  Call this number if you have problems the morning of surgery:  [phone]   Remember:  Do not eat or drink after midnight.  You Dymond drink clear liquids until  12 midnight 05/18/2018 .  Clear liquids allowed are:                    Water, Juice (non-citric and without pulp), Carbonated beverages, Clear Tea, Black Coffee only, Plain Jell-O only, Gatorade and Plain Popsicles only    Take these medicines the morning of surgery with A SIP OF WATER  ultracet (if needed).    Do not wear jewelry, make-up or nail polish.  Do not wear lotions, powders, or perfumes, or deodorant.  Do not shave 48 hours prior to surgery.  Men Xin shave face and neck.  Do not bring valuables to the hospital.  [facility] is not responsible for any belongings or valuables.  Contacts, dentures or bridgework Fifer not be worn into surgery.  Leave your suitcase in the car.  After surgery it Flaharty be brought to your room.  For patients admitted to the hospital, discharge time will be determined by your treatment team.  Patients discharged the day of surgery will not be allowed to drive home.   Name and phone number of your driver:   family Special instructions:  None  Please read over the following fact sheets that you were given. Anesthesia Post-op Instructions and Care and Recovery After Surgery      Knee Ligament Injury, Arthroscopy Arthroscopy is a surgical technique in which your health care provider examines your knee through a small, pencil-sized telescope (arthroscope). Often, repairs to injured ligaments can be done with instruments in the arthroscope. Arthroscopy is less invasive than open-knee surgery. Tell a health care provider about:  Any allergies you have.  All medicines you are taking, including vitamins, herbs, eye drops, creams, and  over-the-counter medicines.  Any problems you or family members have had with anesthetic medicines.  Any blood disorders you have.  Any surgeries you have had.  Any medical conditions you have. What are the risks? Generally, this is a safe procedure. However, as with any procedure, problems can occur. Possible problems include:  Infection.  Bleeding.  Stiffness.  What happens before the procedure?  Ask your health care provider about changing or stopping any regular medicines. Avoid taking aspirin or blood thinners as directed by your health care provider.  Do not eat or drink anything after midnight the night before surgery.  If you smoke, do not smoke for at least 2 weeks before your surgery.  Do not drink alcohol starting the day before your surgery.  Let your health care provider know if you develop a cold or any infection before your surgery.  Arrange for someone to drive you home after the surgery or after your hospital stay. Also arrange for someone to help you with activities during recovery. What happens during the procedure?  Small monitors will be put on your body. They are used to check your heart, blood pressure, and oxygen levels.  An IV access tube will be put into one of your veins. Medicine will be able to flow directly into your body through this IV tube.  You might be given a  medicine to help you relax (sedative).  You will be given a medicine that makes you go to sleep (general anesthetic), and a breathing tube will be placed into your lungs during the procedure.  Several small incisions are made in your knee. Saline fluid is placed into one of the incisions to expand the knee and clear away any blood in the knee.  Your health care provider will insert the arthroscope to examine the injured knee.  During arthroscopy, your health care provider Seyller find a partial or complete tear in a ligament.  Tools can be inserted through the other incisions to  repair the injured ligaments.  The incisions are then closed with absorbable stitches and covered with dressings. What happens after the procedure?  You will be taken to the recovery area where you will be monitored.  When you are awake, stable, and taking fluids without problems, you will be allowed to go home. This information is not intended to replace advice given to you by your health care provider. Make sure you discuss any questions you have with your health care provider. Document Released: 08/09/2000 Document Revised: 01/18/2016 Document Reviewed: 03/24/2013 Elsevier Interactive Patient Education  2017 [place].  Arthroscopic Knee Ligament Repair, Care After This sheet gives you information about how to care for yourself after your procedure. Your health care provider Battey also give you more specific instructions. If you have problems or questions, contact your health care provider. What can I expect after the procedure? After the procedure, it is common to have:  Pain in your knee.  Bruising and swelling on your knee, calf, and ankle for 3-4 days.  Fatigue.  Follow these instructions at home: If you have a brace or immobilizer:  Wear the brace or immobilizer as told by your health care provider. Remove it only as told by your health care provider.  Loosen the splint or immobilizer if your toes tingle, become numb, or turn cold and blue.  Keep the brace or immobilizer clean. Bathing  Do not take baths, swim, or use a hot tub until your health care provider approves. Ask your health care provider if you can take showers.  Keep your bandage (dressing) dry until your health care provider says that it can be removed. Cover it and your brace or immobilizer with a watertight covering when you take a shower. Incision care  Follow instructions from your health care provider about how to take care of your incision. Make sure you: ? Wash your hands with soap and water before  you change your bandage (dressing). If soap and water are not available, use hand sanitizer. ? Change your dressing as told by your health care provider. ? Leave stitches (sutures), skin glue, or adhesive strips in place. These skin closures Capri need to stay in place for 2 weeks or longer. If adhesive strip edges start to loosen and curl up, you Stockard trim the loose edges. Do not remove adhesive strips completely unless your health care provider tells you to do that.  Check your incision area every day for signs of infection. Check for: ? More redness, swelling, or pain. ? More fluid or blood. ? Warmth. ? Pus or a bad smell. Managing pain, stiffness, and swelling  If directed, put ice on the affected area. ? If you have a removable brace or immobilizer, remove it as told by your health care provider. ? Put ice in a plastic bag. ? Place a towel between your skin and the bag  or between your brace or immobilizer and the bag. ? Leave the ice on for 20 minutes, 2-3 times a day.  Move your toes often to avoid stiffness and to lessen swelling.  Raise (elevate) the injured area above the level of your heart while you are sitting or lying down. Driving  Do not drive until your health care provider approves. If you have a brace or immobilizer on your leg, ask your health care provider when it is safe for you to drive.  Do not drive or use heavy machinery while taking prescription pain medicine. Activity  Rest as directed. Ask your health care provider what activities are safe for you.  Do physical therapy exercises as told by your health care provider. Physical therapy will help you regain strength and motion in your knee.  Follow instructions from your health care provider about: ? When you Dao start motion exercises. ? When you Fennewald start riding a stationary bike and doing other low-impact activities. ? When you Niday start to jog and do other high-impact activities. Safety  Do not use the  injured limb to support your body weight until your health care provider says that you can. Use crutches as told by your health care provider. General instructions  Do not use any products that contain nicotine or tobacco, such as cigarettes and e-cigarettes. These can delay bone healing. If you need help quitting, ask your health care provider.  To prevent or treat constipation while you are taking prescription pain medicine, your health care provider Rauls recommend that you: ? Drink enough fluid to keep your urine clear or pale yellow. ? Take over-the-counter or prescription medicines. ? Eat foods that are high in fiber, such as fresh fruits and vegetables, whole grains, and beans. ? Limit foods that are high in fat and processed sugars, such as fried and sweet foods.  Take over-the-counter and prescription medicines only as told by your health care provider.  Keep all follow-up visits as told by your health care provider. This is important. Contact a health care provider if:  You have more redness, swelling, or pain around an incision.  You have more fluid or blood coming from an incision.  Your incision feels warm to the touch.  You have a fever.  You have pain or swelling in your knee, and it gets worse.  You have pain that does not get better with medicine. Get help right away if:  You have trouble breathing.  You have pus or a bad smell coming from an incision.  You have numbness and tingling near the knee joint. Summary  After the procedure, it is common to have knee pain with bruising and swelling on your knee, calf, and ankle.  Icing your knee and raising your leg above the level of your heart will help control the pain and the swelling.  Do physical therapy exercises as told by your health care provider. Physical therapy will help you regain strength and motion in your knee. This information is not intended to replace advice given to you by your health care  provider. Make sure you discuss any questions you have with your health care provider. Document Released: 06/02/2013 Document Revised: 08/06/2016 Document Reviewed: 08/06/2016 Elsevier Interactive Patient Education  2017 [place] Anesthesia, Adult General anesthesia is the use of medicines to make a person "go to sleep" (be unconscious) for a medical procedure. General anesthesia is often recommended when a procedure:  Is long.  Requires you to  be still or in an unusual position.  Is major and can cause you to lose blood.  Is impossible to do without general anesthesia.  The medicines used for general anesthesia are called general anesthetics. In addition to making you sleep, the medicines:  Prevent pain.  Control your blood pressure.  Relax your muscles.  Tell a health care provider about:  Any allergies you have.  All medicines you are taking, including vitamins, herbs, eye drops, creams, and over-the-counter medicines.  Any problems you or family members have had with anesthetic medicines.  Types of anesthetics you have had in the past.  Any bleeding disorders you have.  Any surgeries you have had.  Any medical conditions you have.  Any history of heart or lung conditions, such as heart failure, sleep apnea, or chronic obstructive pulmonary disease (COPD).  Whether you are pregnant or Crocker be pregnant.  Whether you use tobacco, alcohol, marijuana, or street drugs.  Any history of Armed forces logistics/support/administrative officer.  Any history of depression or anxiety. What are the risks? Generally, this is a safe procedure. However, problems Efferson occur, including:  Allergic reaction to anesthetics.  Lung and heart problems.  Inhaling food or liquids from your stomach into your lungs (aspiration).  Injury to nerves.  Waking up during your procedure and being unable to move (rare).  Extreme agitation or a state of mental confusion (delirium) when you wake up from the  anesthetic.  Air in the bloodstream, which can lead to stroke.  These problems are more likely to develop if you are having a major surgery or if you have an advanced medical condition. You can prevent some of these complications by answering all of your health care provider's questions thoroughly and by following all pre-procedure instructions. General anesthesia can cause side effects, including:  Nausea or vomiting  A sore throat from the breathing tube.  Feeling cold or shivery.  Feeling tired, washed out, or achy.  Sleepiness or drowsiness.  Confusion or agitation.  What happens before the procedure? Staying hydrated Follow instructions from your health care provider about hydration, which Sciarra include:  Up to 2 hours before the procedure - you Reedy continue to drink clear liquids, such as water, clear fruit juice, black coffee, and plain tea.  Eating and drinking restrictions Follow instructions from your health care provider about eating and drinking, which Derhammer include:  8 hours before the procedure - stop eating heavy meals or foods such as meat, fried foods, or fatty foods.  6 hours before the procedure - stop eating light meals or foods, such as toast or cereal.  6 hours before the procedure - stop drinking milk or drinks that contain milk.  2 hours before the procedure - stop drinking clear liquids.  Medicines  Ask your health care provider about: ? Changing or stopping your regular medicines. This is especially important if you are taking diabetes medicines or blood thinners. ? Taking medicines such as aspirin and ibuprofen. These medicines can thin your blood. Do not take these medicines before your procedure if your health care provider instructs you not to. ? Taking new dietary supplements or medicines. Do not take these during the week before your procedure unless your health care provider approves them.  If you are told to take a medicine or to continue  taking a medicine on the day of the procedure, take the medicine with sips of water. General instructions   Ask if you will be going home the same day, the  following day, or after a longer hospital stay. ? Plan to have someone take you home. ? Plan to have someone stay with you for the first 24 hours after you leave the hospital or clinic.  For 3-6 weeks before the procedure, try not to use any tobacco products, such as cigarettes, chewing tobacco, and e-cigarettes.  You Lukic brush your teeth on the morning of the procedure, but make sure to spit out the toothpaste. What happens during the procedure?  You will be given anesthetics through a mask and through an IV tube in one of your veins.  You Deans receive medicine to help you relax (sedative).  As soon as you are asleep, a breathing tube Lagasse be used to help you breathe.  An anesthesia specialist will stay with you throughout the procedure. He or she will help keep you comfortable and safe by continuing to give you medicines and adjusting the amount of medicine that you get. He or she will also watch your blood pressure, pulse, and oxygen levels to make sure that the anesthetics do not cause any problems.  If a breathing tube was used to help you breathe, it will be removed before you wake up. The procedure Magro vary among health care providers and hospitals. What happens after the procedure?  You will wake up, often slowly, after the procedure is complete, usually in a recovery area.  Your blood pressure, heart rate, breathing rate, and blood oxygen level will be monitored until the medicines you were given have worn off.  You Givans be given medicine to help you calm down if you feel anxious or agitated.  If you will be going home the same day, your health care provider Gitto check to make sure you can stand, drink, and urinate.  Your health care providers will treat your pain and side effects before you go home.  Do not drive for 24  hours if you received a sedative.  You Chestang: ? Feel nauseous and vomit. ? Have a sore throat. ? Have mental slowness. ? Feel cold or shivery. ? Feel sleepy. ? Feel tired. ? Feel sore or achy, even in parts of your body where you did not have surgery. This information is not intended to replace advice given to you by your health care provider. Make sure you discuss any questions you have with your health care provider. Document Released: 11/19/2007 Document Revised: 01/23/2016 Document Reviewed: 07/27/2015 Elsevier Interactive Patient Education  2018 [place] Anesthesia, Adult, Care After These instructions provide you with information about caring for yourself after your procedure. Your health care provider Suchy also give you more specific instructions. Your treatment has been planned according to current medical practices, but problems sometimes occur. Call your health care provider if you have any problems or questions after your procedure. What can I expect after the procedure? After the procedure, it is common to have:  Vomiting.  A sore throat.  Mental slowness.  It is common to feel:  Nauseous.  Cold or shivery.  Sleepy.  Tired.  Sore or achy, even in parts of your body where you did not have surgery.  Follow these instructions at home: For at least 24 hours after the procedure:  Do not: ? Participate in activities where you could fall or become injured. ? Drive. ? Use heavy machinery. ? Drink alcohol. ? Take sleeping pills or medicines that cause drowsiness. ? Make important decisions or sign legal documents. ? Take care of children on your  own.  Rest. Eating and drinking  If you vomit, drink water, juice, or soup when you can drink without vomiting.  Drink enough fluid to keep your urine clear or pale yellow.  Make sure you have little or no nausea before eating solid foods.  Follow the diet recommended by your health care  provider. General instructions  Have a responsible adult stay with you until you are awake and alert.  Return to your normal activities as told by your health care provider. Ask your health care provider what activities are safe for you.  Take over-the-counter and prescription medicines only as told by your health care provider.  If you smoke, do not smoke without supervision.  Keep all follow-up visits as told by your health care provider. This is important. Contact a health care provider if:  You continue to have nausea or vomiting at home, and medicines are not helpful.  You cannot drink fluids or start eating again.  You cannot urinate after 8-12 hours.  You develop a skin rash.  You have fever.  You have increasing redness at the site of your procedure. Get help right away if:  You have difficulty breathing.  You have chest pain.  You have unexpected bleeding.  You feel that you are having a life-threatening or urgent problem. This information is not intended to replace advice given to you by your health care provider. Make sure you discuss any questions you have with your health care provider. Document Released: 11/18/2000 Document Revised: 01/15/2016 Document Reviewed: 07/27/2015 Elsevier Interactive Patient Education  [employer].

## 2018-05-15 ENCOUNTER — Other Ambulatory Visit: Payer: Self-pay

## 2018-05-15 ENCOUNTER — Encounter (HOSPITAL_COMMUNITY): Payer: Self-pay

## 2018-05-15 ENCOUNTER — Encounter (HOSPITAL_COMMUNITY)
Admission: RE | Admit: 2018-05-15 | Discharge: 2018-05-15 | Disposition: A | Discharge status: Home / Self Care | Payer: Medicaid Other | Source: Ambulatory Visit | Attending: Orthopedic Surgery | Admitting: Orthopedic Surgery

## 2018-05-15 DIAGNOSIS — Z01812 Encounter for preprocedural laboratory examination: Secondary | ICD-10-CM | POA: Diagnosis not present

## 2018-05-15 HISTORY — DX: Essential (primary) hypertension: I10

## 2018-05-15 LAB — BASIC METABOLIC PANEL
Anion gap: 10 (ref 5–15)
BUN: 13 mg/dL (ref 6–20)
CALCIUM: 8.9 mg/dL (ref 8.9–10.3)
CHLORIDE: 104 mmol/L (ref 98–111)
CO2: 24 mmol/L (ref 22–32)
CREATININE: 0.99 mg/dL (ref 0.61–1.24)
GFR calc non Af Amer: >60 mL/min (ref >60)
Glucose, Bld: 116 mg/dL — ABNORMAL HIGH (ref 70–99)
Potassium: 3.4 mmol/L — ABNORMAL LOW (ref 3.5–5.1)
SODIUM: 138 mmol/L (ref 135–145)

## 2018-05-15 LAB — CBC WITH DIFFERENTIAL/PLATELET
BASOS ABS: 0 10*3/uL (ref 0.0–0.1)
BASOS PCT: 0 %
EOS ABS: 0.2 10*3/uL (ref 0.0–0.7)
Eosinophils Relative: 2 %
HEMATOCRIT: 45.4 % (ref 39.0–52.0)
HEMOGLOBIN: 15.4 g/dL (ref 13.0–17.0)
Lymphocytes Relative: 26 %
Lymphs Abs: 2.5 10*3/uL (ref 0.7–4.0)
MCH: 30.4 pg (ref 26.0–34.0)
MCHC: 33.9 g/dL (ref 30.0–36.0)
MCV: 89.7 fL (ref 78.0–100.0)
Monocytes Absolute: 0.7 10*3/uL (ref 0.1–1.0)
Monocytes Relative: 7 %
NEUTROS ABS: 6.5 10*3/uL (ref 1.7–7.7)
NEUTROS PCT: 65 %
Platelets: 252 10*3/uL (ref 150–400)
RBC: 5.06 MIL/uL (ref 4.22–5.81)
RDW: 13.2 % (ref 11.5–15.5)
WBC: 9.9 10*3/uL (ref 4.0–10.5)

## 2018-05-18 NOTE — H&P (Signed)
Victor [name]      Pre op    HISTORY SECTION :       Chief Complaint  Patient presents with  . Knee Pain      right     HPI The patient presents for evaluation of ongoing pain catching and locking in his right knee   The patient had exam under anesthesia and arthroscopy with medial meniscectomy and lateral meniscectomy of the right knee back in October 2018 at that time he had an anterior cruciate ligament tear which was scarred to the PCL as well as a lateral meniscal tear grade 2 to grade 3 surface lesion of the lateral femoral condyle with a fissure in the trochlea and a grade 2 patella lesion of the medial facet.   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  All other systems reviewed and are negative.         Past Medical History:  Diagnosis Date  . GERD (gastroesophageal reflux disease)    . History of stomach ulcers           Past Surgical History:  Procedure Laterality Date  . APPENDECTOMY      . KNEE ARTHROSCOPY WITH MEDIAL MENISECTOMY Right 05/29/2017    Procedure: KNEE ARTHROSCOPY WITH MEDIAL MENISECTOMY and lateral meniscectomy;  Surgeon: [name], MD;  Location: [facility];  Service: Orthopedics;  Laterality: Right;  . KNEE ARTHROSCOPY WITH MEDIAL MENISECTOMY Right 02/05/2018    Procedure: RIGHT KNEE ARTHROSCOPY WITH  PARTIAL MEDIAL AND LATERAL MENISECTOMY;  Surgeon: [name], MD;  Location: [facility];  Service: Orthopedics;  Laterality: Right;           Family History  Problem Relation Age of Onset  . Diabetes Mother    . Heart failure Mother    . Cancer Mother    . Pancreatic cancer Mother    . Cancer Father    . Throat cancer Father    . Healthy Sister    . Head & neck cancer Brother    . Bone cancer Maternal Grandmother    . Diabetes Maternal Grandmother    . CAD Maternal Grandfather    . Brain cancer Maternal Grandfather    . Colon cancer Paternal Grandfather        Social History         Tobacco Use  . Smoking status: Current Every Day Smoker      Packs/day: 1.00      Years: 15.00      Pack years: 15.00      Types: Cigarettes      Start date: 07/07/1991  . Smokeless tobacco: Never Used  Substance Use Topics  . Alcohol use: No  . Drug use: No          No Known Allergies  Current Outpatient Medications:  .  atorvastatin (LIPITOR) 40 MG tablet, Take 1 tablet (40 mg total) by mouth daily., Disp: 90 tablet, Rfl: 1 .  hydrochlorothiazide (HYDRODIURIL) 25 MG tablet, Take 1 tablet (25 mg total) by mouth daily., Disp: 30 tablet, Rfl: 0 .  ibuprofen (ADVIL,MOTRIN) 800 MG tablet, Take 1 tablet (800 mg total) by mouth 3 (three) times daily. (Patient taking differently: Take 800 mg by mouth every 8 (eight) hours as needed. ), Disp: 90 tablet, Rfl: 0 .  varenicline (CHANTIX STARTING MONTH PAK) 0.5 MG X 11 & 1 MG X 42 tablet, Take 0.5 mg tablet by mouth daily x3 days, then increase to one 0.5 mg tablet 2x daily x4 days, then increase to one 1 mg tablet 2x daily., Disp: 53 tablet, Rfl: 0 .  traMADol-acetaminophen (ULTRACET) 37.5-325 MG tablet, Take 1 tablet by mouth every 8 (eight) hours as needed for up to 21 days., Disp: 21 tablet, Rfl: 2     PHYSICAL EXAM SECTION: BP (!) 165/99   Pulse 81   Ht 5\' 11"  (1.803 m)   Wt 240 lb (108.9 kg)   BMI 33.47 kg/m    General  appearance the patient is normally developed grooming and hygiene are normal   Oriented x3   Mood pleasant affect normal   Gait slight disturbance with altered gait favoring the right lower extremity   Extremity #1 left knee Inspection no swelling or tenderness Range of motion full Stability tests were normal Strength is 5/5 Skin was warm dry and intact without rash lesion or ulceration Pulse and perfusion normal without edema Normal sensation   Extremity #2 right knee Skin is warm dry and intact he has good distal pulse normal perfusion no edema normal sensation in the right lower extremity   He has tenderness and swelling lateral compartment primarily trace positive anterior drawer and Lachman test with stable collateral ligaments normal strength and muscle tone slight decrease in overall range of motion secondary to the fusion   MEDICAL DECISION SECTION:      Encounter Diagnoses  Name Primary?  . S/P right knee arthroscopy 02/05/18    . Derangement of posterior horn of medial meniscus of right knee    . Rupture of anterior cruciate ligament of right knee, subsequent encounter Yes      Imaging MRI correction: acl laying on the pcl (report in error) medial meniscus torn is a re-tear   MRI report FINDINGS: MENISCI   Medial meniscus: There is fluid bright signal in the posterior horn in a longitudinal orientation consistent with either a longitudinal tear or nondisplaced flap tear.   Lateral meniscus: Blunting is seen along the free edge throughout with an appearance most compatible with prior meniscectomy. No definite tear is identified.   LIGAMENTS   Cruciates:  Intact.   Collaterals:  Intact.   CARTILAGE   Patellofemoral: Cartilage thinning is seen in the central aspect of the femoral trochlea.   Medial:  Mildly thinned without focal defect.   Lateral:  Mildly thinned without focal defect.   Joint:  Small effusion.   Popliteal Fossa:  No Baker's cyst.    Extensor Mechanism:  Intact.   Bones: No fracture or worrisome lesion. There is some osteophytosis about the knee.   Other: None.   IMPRESSION: Longitudinal tear or nondisplaced flap tear posterior horn medial meniscus.   Diminutive lateral meniscus has an appearance most consistent with prior meniscectomy. No tear is identified.   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     Electronically Signed   By: Inge Rise M.D.   On: 04/20/2018 15:29         Plan: ACL reconstruction right knee allograft (Rx., Inj., surg., Frx, MRI/CT, XR:2)   I planned to do another medial meniscectomy on him but he and his significant other discussed their concerns about further instability episodes and since that is his primary complaint they would like to proceed with reconstruction of the ACL to prevent the instability.   I did tell him though that that is usually not successful in the setting of arthritic knee and he is too young for knee replacement.  So I advised him that as long as they know that the knee will not be normal and that he will still have trouble with the knee that the ACL reconstruction can be done to prevent the instability episodes and protect the knee from further chondral damage and cartilage damage   They are agreeable to this and wished to proceed with an ACL reconstruction of the right knee   Surgery    He opted for acl reconstruction vs tka vs menisectomy, and we will go ahead with an allograft reconstruction of the knee to preserve his patellofemoral joint.   The procedure has been fully reviewed with the patient; The risks and benefits of surgery have been discussed and explained and understood. Alternative treatment has also been reviewed, questions were encouraged and answered. The postoperative plan is also been reviewed.          Electronically signed by [name], MD

## 2018-05-19 ENCOUNTER — Other Ambulatory Visit: Payer: Self-pay

## 2018-05-19 ENCOUNTER — Encounter (HOSPITAL_COMMUNITY)
Admission: RE | Disposition: A | Discharge status: Home / Self Care | Payer: Self-pay | Source: Ambulatory Visit | Attending: Orthopedic Surgery

## 2018-05-19 ENCOUNTER — Ambulatory Visit (HOSPITAL_COMMUNITY)
Admission: RE | Admit: 2018-05-19 | Discharge: 2018-05-19 | Disposition: A | Discharge status: Home / Self Care | Payer: Medicaid Other | Source: Ambulatory Visit | Attending: Orthopedic Surgery | Admitting: Orthopedic Surgery

## 2018-05-19 ENCOUNTER — Ambulatory Visit (HOSPITAL_COMMUNITY): Payer: Medicaid Other | Admitting: Anesthesiology

## 2018-05-19 ENCOUNTER — Encounter (HOSPITAL_COMMUNITY): Payer: Self-pay | Admitting: Emergency Medicine

## 2018-05-19 DIAGNOSIS — X58XXXA Exposure to other specified factors, initial encounter: Secondary | ICD-10-CM | POA: Diagnosis not present

## 2018-05-19 DIAGNOSIS — Z79899 Other long term (current) drug therapy: Secondary | ICD-10-CM | POA: Diagnosis not present

## 2018-05-19 DIAGNOSIS — S83511D Sprain of anterior cruciate ligament of right knee, subsequent encounter: Secondary | ICD-10-CM | POA: Diagnosis not present

## 2018-05-19 DIAGNOSIS — S83511A Sprain of anterior cruciate ligament of right knee, initial encounter: Secondary | ICD-10-CM | POA: Insufficient documentation

## 2018-05-19 DIAGNOSIS — I10 Essential (primary) hypertension: Secondary | ICD-10-CM | POA: Diagnosis not present

## 2018-05-19 DIAGNOSIS — M25561 Pain in right knee: Secondary | ICD-10-CM | POA: Diagnosis present

## 2018-05-19 DIAGNOSIS — F17210 Nicotine dependence, cigarettes, uncomplicated: Secondary | ICD-10-CM | POA: Insufficient documentation

## 2018-05-19 HISTORY — PX: ANTERIOR CRUCIATE LIGAMENT REPAIR: SHX115

## 2018-05-19 SURGERY — REPAIR, KNEE, ACL
Anesthesia: General | Laterality: Right

## 2018-05-19 MED ORDER — SODIUM CHLORIDE 0.9 % IR SOLN
Status: DC | PRN
  Administered 2018-05-19 (×7): 3000 mL

## 2018-05-19 MED ORDER — PROMETHAZINE HCL 25 MG/ML IJ SOLN: 6.2500 mg | INTRAMUSCULAR | Status: DC | PRN

## 2018-05-19 MED ORDER — ONDANSETRON HCL 4 MG/2ML IJ SOLN
INTRAMUSCULAR | Status: AC
  Filled 2018-05-19: qty 2

## 2018-05-19 MED ORDER — FENTANYL CITRATE (PF) 250 MCG/5ML IJ SOLN
INTRAMUSCULAR | Status: AC
  Filled 2018-05-19: qty 5

## 2018-05-19 MED ORDER — IBUPROFEN 800 MG PO TABS: 800.0000 mg | ORAL_TABLET | Freq: Three times a day (TID) | ORAL | 1 refills | Status: DC | PRN

## 2018-05-19 MED ORDER — CHLORHEXIDINE GLUCONATE 4 % EX LIQD: 60.0000 mL | Freq: Once | CUTANEOUS | Status: DC

## 2018-05-19 MED ORDER — BUPIVACAINE-EPINEPHRINE 0.5% -1:200000 IJ SOLN
INTRAMUSCULAR | Status: DC | PRN
  Administered 2018-05-19: 60 mL

## 2018-05-19 MED ORDER — KETOROLAC TROMETHAMINE 30 MG/ML IJ SOLN
30.0000 mg | Freq: Once | INTRAMUSCULAR | Status: AC
  Administered 2018-05-19: 30 mg via INTRAVENOUS
  Filled 2018-05-19: qty 1

## 2018-05-19 MED ORDER — ROCURONIUM BROMIDE 50 MG/5ML IV SOLN
INTRAVENOUS | Status: AC
  Filled 2018-05-19: qty 1

## 2018-05-19 MED ORDER — OXYCODONE-ACETAMINOPHEN 10-325 MG PO TABS: 1.0000 | ORAL_TABLET | ORAL | 0 refills | Status: AC | PRN

## 2018-05-19 MED ORDER — ONDANSETRON HCL 4 MG/2ML IJ SOLN
4.0000 mg | Freq: Once | INTRAMUSCULAR | Status: DC
  Filled 2018-05-19: qty 2

## 2018-05-19 MED ORDER — ONDANSETRON HCL 4 MG/2ML IJ SOLN
INTRAMUSCULAR | Status: DC | PRN
  Administered 2018-05-19 (×2): 4 mg via INTRAVENOUS

## 2018-05-19 MED ORDER — SUCCINYLCHOLINE CHLORIDE 20 MG/ML IJ SOLN
INTRAMUSCULAR | Status: AC
  Filled 2018-05-19: qty 1

## 2018-05-19 MED ORDER — EPINEPHRINE PF 1 MG/ML IJ SOLN
INTRAMUSCULAR | Status: AC
  Filled 2018-05-19: qty 10

## 2018-05-19 MED ORDER — MIDAZOLAM HCL 5 MG/5ML IJ SOLN
INTRAMUSCULAR | Status: DC | PRN
  Administered 2018-05-19: 2 mg via INTRAVENOUS

## 2018-05-19 MED ORDER — MIDAZOLAM HCL 2 MG/2ML IJ SOLN
INTRAMUSCULAR | Status: AC
  Filled 2018-05-19: qty 2

## 2018-05-19 MED ORDER — PROPOFOL 10 MG/ML IV BOLUS
INTRAVENOUS | Status: AC
  Filled 2018-05-19: qty 40

## 2018-05-19 MED ORDER — SUCCINYLCHOLINE 20MG/ML (10ML) SYRINGE FOR MEDFUSION PUMP - OPTIME
INTRAMUSCULAR | Status: DC | PRN
  Administered 2018-05-19: 120 mg via INTRAVENOUS

## 2018-05-19 MED ORDER — LIDOCAINE HCL (PF) 1 % IJ SOLN
INTRAMUSCULAR | Status: AC
  Filled 2018-05-19: qty 5

## 2018-05-19 MED ORDER — FENTANYL CITRATE (PF) 100 MCG/2ML IJ SOLN
INTRAMUSCULAR | Status: DC | PRN
  Administered 2018-05-19 (×11): 50 ug via INTRAVENOUS

## 2018-05-19 MED ORDER — LACTATED RINGERS IV SOLN
INTRAVENOUS | Status: DC
  Administered 2018-05-19 (×2): via INTRAVENOUS

## 2018-05-19 MED ORDER — MEPERIDINE HCL 50 MG/ML IJ SOLN: 6.2500 mg | INTRAMUSCULAR | Status: DC | PRN

## 2018-05-19 MED ORDER — 0.9 % SODIUM CHLORIDE (POUR BTL) OPTIME
TOPICAL | Status: DC | PRN
  Administered 2018-05-19 (×3): 1000 mL

## 2018-05-19 MED ORDER — LIDOCAINE HCL (CARDIAC) PF 50 MG/5ML IV SOSY
PREFILLED_SYRINGE | INTRAVENOUS | Status: DC | PRN
  Administered 2018-05-19: 40 mg via INTRAVENOUS

## 2018-05-19 MED ORDER — ACETAMINOPHEN 500 MG PO TABS
500.0000 mg | ORAL_TABLET | Freq: Once | ORAL | Status: AC
  Administered 2018-05-19: 500 mg via ORAL
  Filled 2018-05-19: qty 1

## 2018-05-19 MED ORDER — OXYCODONE HCL 5 MG PO TABS
5.0000 mg | ORAL_TABLET | Freq: Once | ORAL | Status: AC
  Administered 2018-05-19: 5 mg via ORAL
  Filled 2018-05-19: qty 1

## 2018-05-19 MED ORDER — GABAPENTIN 300 MG PO CAPS
300.0000 mg | ORAL_CAPSULE | Freq: Once | ORAL | Status: AC
  Administered 2018-05-19: 300 mg via ORAL
  Filled 2018-05-19: qty 1

## 2018-05-19 MED ORDER — PROMETHAZINE HCL 12.5 MG PO TABS: 12.5000 mg | ORAL_TABLET | Freq: Four times a day (QID) | ORAL | 0 refills | Status: DC | PRN

## 2018-05-19 MED ORDER — HYDROMORPHONE HCL 1 MG/ML IJ SOLN
0.2500 mg | INTRAMUSCULAR | Status: DC | PRN
  Administered 2018-05-19 (×3): 0.5 mg via INTRAVENOUS
  Filled 2018-05-19 (×3): qty 0.5

## 2018-05-19 MED ORDER — PROPOFOL 10 MG/ML IV BOLUS
INTRAVENOUS | Status: DC | PRN
  Administered 2018-05-19: 200 mg via INTRAVENOUS

## 2018-05-19 MED ORDER — HYDROCODONE-ACETAMINOPHEN 7.5-325 MG PO TABS: 1.0000 | ORAL_TABLET | Freq: Once | ORAL | Status: DC | PRN

## 2018-05-19 MED ORDER — BUPIVACAINE-EPINEPHRINE (PF) 0.5% -1:200000 IJ SOLN
INTRAMUSCULAR | Status: AC
  Filled 2018-05-19: qty 60

## 2018-05-19 MED ORDER — LACTATED RINGERS IV SOLN: INTRAVENOUS | Status: DC

## 2018-05-19 MED ORDER — CEFAZOLIN SODIUM-DEXTROSE 2-4 GM/100ML-% IV SOLN
2.0000 g | INTRAVENOUS | Status: AC
  Administered 2018-05-19: 2 g via INTRAVENOUS
  Filled 2018-05-19: qty 100

## 2018-05-19 SURGICAL SUPPLY — 83 items
BANDAGE ELASTIC 6 LF NS (GAUZE/BANDAGES/DRESSINGS) ×2 IMPLANT
BANDAGE ESMARK 6X9 LF (GAUZE/BANDAGES/DRESSINGS) ×1 IMPLANT
BIT DRILL 2.4X128 (BIT) ×2 IMPLANT
BIT DRILL 2.4X128MM (BIT) ×1
BLADE AGGRESSIVE PLUS 4.0 (BLADE) ×3 IMPLANT
BLADE OSC/SAGITTAL MD 9X18.5 (BLADE) ×3 IMPLANT
BLADE SURG SZ10 CARB STEEL (BLADE) ×6 IMPLANT
BLADE SURG SZ11 CARB STEEL (BLADE) ×3 IMPLANT
BNDG CMPR 9X6 STRL LF SNTH (GAUZE/BANDAGES/DRESSINGS) ×1
BNDG CMPR MED 5X6 ELC HKLP NS (GAUZE/BANDAGES/DRESSINGS) ×1
BNDG ESMARK 6X9 LF (GAUZE/BANDAGES/DRESSINGS) ×3
BRACE T-SCOPE KNEE POSTOP (MISCELLANEOUS) ×3 IMPLANT
BUR AGGRESSIVE PLUS 5.0 (BURR) ×3 IMPLANT
CHLORAPREP W/TINT 26ML (MISCELLANEOUS) ×6 IMPLANT
CLOSURE WOUND 1/2 X4 (GAUZE/BANDAGES/DRESSINGS) ×1
CLOTH BEACON ORANGE TIMEOUT ST (SAFETY) ×3 IMPLANT
COOLER CRYO CUFF IC AND MOTOR (MISCELLANEOUS) ×3 IMPLANT
COVER LIGHT HANDLE STERIS (MISCELLANEOUS) ×6 IMPLANT
CUFF CRYO KNEE18X23 MED (MISCELLANEOUS) ×3 IMPLANT
CUFF TOURNIQUET SINGLE 34IN LL (TOURNIQUET CUFF) ×3 IMPLANT
CUTTER TOMCAT 5.0MM (BURR) ×2 IMPLANT
DECANTER SPIKE VIAL GLASS SM (MISCELLANEOUS) ×6 IMPLANT
ELECT REM PT RETURN 9FT ADLT (ELECTROSURGICAL) ×3
ELECTRODE REM PT RTRN 9FT ADLT (ELECTROSURGICAL) ×1 IMPLANT
FLOOR PAD 36X40 (MISCELLANEOUS) ×3
GAUZE 4X4 16PLY RFD (DISPOSABLE) ×3 IMPLANT
GAUZE SPONGE 4X4 12PLY STRL (GAUZE/BANDAGES/DRESSINGS) ×2 IMPLANT
GAUZE XEROFORM 5X9 LF (GAUZE/BANDAGES/DRESSINGS) ×3 IMPLANT
GLOVE BIO SURGEON STRL SZ7 (GLOVE) ×4 IMPLANT
GLOVE BIOGEL PI IND STRL 7.0 (GLOVE) ×1 IMPLANT
GLOVE BIOGEL PI INDICATOR 7.0 (GLOVE) ×8
GLOVE ECLIPSE 6.5 STRL STRAW (GLOVE) ×2 IMPLANT
GLOVE SKINSENSE NS SZ8.0 LF (GLOVE) ×2
GLOVE SKINSENSE STRL SZ8.0 LF (GLOVE) ×1 IMPLANT
GLOVE SS N UNI LF 8.5 STRL (GLOVE) ×3 IMPLANT
GOWN STRL REUS W/TWL LRG LVL3 (GOWN DISPOSABLE) ×6 IMPLANT
GOWN STRL REUS W/TWL XL LVL3 (GOWN DISPOSABLE) ×5 IMPLANT
GRAFT ACHILLES CALC BNE BLCK (Bone Implant) IMPLANT
GRAFT ACHILLES TENDON (Bone Implant) ×3 IMPLANT
HLDR LEG FOAM (MISCELLANEOUS) ×1 IMPLANT
INST SET MINOR BONE (KITS) ×3 IMPLANT
IV NS IRRIG 3000ML ARTHROMATIC (IV SOLUTION) ×19 IMPLANT
KIT BLADEGUARD II DBL (SET/KITS/TRAYS/PACK) ×3 IMPLANT
KIT TRANSTIBIAL (DISPOSABLE) ×3 IMPLANT
KIT TURNOVER CYSTO (KITS) ×3 IMPLANT
LEG HOLDER FOAM (MISCELLANEOUS) ×2
MANIFOLD NEPTUNE II (INSTRUMENTS) ×3 IMPLANT
MARKER SKIN DUAL TIP RULER LAB (MISCELLANEOUS) ×3 IMPLANT
NDL HYPO 21X1.5 SAFETY (NEEDLE) ×1 IMPLANT
NDL SPNL 18GX3.5 QUINCKE PK (NEEDLE) ×1 IMPLANT
NEEDLE HYPO 21X1.5 SAFETY (NEEDLE) ×3 IMPLANT
NEEDLE SPNL 18GX3.5 QUINCKE PK (NEEDLE) ×3 IMPLANT
NS IRRIG 1000ML POUR BTL (IV SOLUTION) ×7 IMPLANT
PACK ARTHRO LIMB DRAPE STRL (MISCELLANEOUS) ×3 IMPLANT
PACK BASIC III (CUSTOM PROCEDURE TRAY) ×3
PACK SRG BSC III STRL LF ECLPS (CUSTOM PROCEDURE TRAY) ×1 IMPLANT
PAD ABD 5X9 TENDERSORB (GAUZE/BANDAGES/DRESSINGS) ×2 IMPLANT
PAD ARMBOARD 7.5X6 YLW CONV (MISCELLANEOUS) ×3 IMPLANT
PAD FLOOR 36X40 (MISCELLANEOUS) ×1 IMPLANT
PADDING CAST COTTON 6X4 STRL (CAST SUPPLIES) ×2 IMPLANT
PENCIL HANDSWITCHING (ELECTRODE) ×3 IMPLANT
REAMER LOW PROFILE 10MM (INSTRUMENTS) ×2 IMPLANT
SCREW BIOCOMPOSITE 10X23 (Screw) ×2 IMPLANT
SCREW BIOCOMPOSITE 7X23 (Screw) ×2 IMPLANT
SET ARTHROSCOPY INST (INSTRUMENTS) ×3 IMPLANT
SET BASIN LINEN APH (SET/KITS/TRAYS/PACK) ×3 IMPLANT
SPONGE LAP 18X18 X RAY DECT (DISPOSABLE) ×3 IMPLANT
STAPLE FIXATION W/SPIKE XSMALL (Staple) ×2 IMPLANT
STAPLER VISISTAT 35W (STAPLE) IMPLANT
STRIP CLOSURE SKIN 1/2X4 (GAUZE/BANDAGES/DRESSINGS) ×2 IMPLANT
SUT ETHIBOND 5 LR DA (SUTURE) ×6 IMPLANT
SUT ETHILON 3 0 FSL (SUTURE) ×2 IMPLANT
SUT MON AB 0 CT1 (SUTURE) ×5 IMPLANT
SUT MON AB 2-0 CT1 36 (SUTURE) ×3 IMPLANT
SUT VIC AB 1 CT1 27 (SUTURE) ×3
SUT VIC AB 1 CT1 27XBRD ANTBC (SUTURE) ×1 IMPLANT
SYR 30ML LL (SYRINGE) ×3 IMPLANT
SYR BULB IRRIGATION 50ML (SYRINGE) ×6 IMPLANT
TOWEL OR 17X26 4PK STRL BLUE (TOWEL DISPOSABLE) ×3 IMPLANT
TUBING ARTHROSCOPY INFLOW/OUT (IRRIGATION / IRRIGATOR) ×3 IMPLANT
WAND 90 DEG TURBOVAC W/CORD (SURGICAL WAND) ×2 IMPLANT
YANKAUER SUCT 12FT TUBE ARGYLE (SUCTIONS) ×3 IMPLANT
YANKAUER SUCT BULB TIP 10FT TU (MISCELLANEOUS) ×9 IMPLANT

## 2018-05-19 NOTE — Interval H&P Note (Signed)
History and Physical Interval Note:  05/19/2018 7:55 AM  Victor [name]  has presented today for surgery, with the diagnosis of ACL tear and Medial meniscus tear of the right knee  The various methods of treatment have been discussed with the patient and family. After consideration of risks, benefits and other options for treatment, the patient has consented to  Procedure(s): KNEE ARTHROSCOPY WITH ANTERIOR CRUCIATE LIGAMENT (ACL) REPAIR and MEDIAL MENISECTOMY (Right) as a surgical intervention .  The patient's history has been reviewed, patient examined, no change in status, stable for surgery.  I have reviewed the patient's chart and labs.  Questions were answered to the patient's satisfaction.     Arther Abbott

## 2018-05-19 NOTE — Brief Op Note (Addendum)
05/19/2018  11:31 AM  PATIENT:  Victor [name]  41 y.o. male  PRE-OPERATIVE DIAGNOSIS:  ACL tear and Medial meniscus tear of the right knee  POST-OPERATIVE DIAGNOSIS:  ACL tear right knmee PROCEDURE:  Procedure(s): KNEE ARTHROSCOPY WITH ANTERIOR CRUCIATE LIGAMENT (ACL) ALLOGRAFT RECONSTRUCTION  IMPLANTS  : ARTHREX 7 X 23 AND 10 X 23 BIOCOMPOSITE INTERFERENCE SCREW   SURGEON:  Surgeon(s) and Role:    * [name], MD - Primary  PHYSICIAN ASSISTANT:   ASSISTANTS: BETTY ASHLEY    ANESTHESIA:   general  EBL:  MIN    BLOOD ADMINISTERED:none  DRAINS: none   LOCAL MEDICATIONS USED:  MARCAINE     SPECIMEN:  No Specimen  DISPOSITION OF SPECIMEN:  N/A  COUNTS:  YES  TOURNIQUET:   Total Tourniquet Time Documented: Thigh (Right) - 90 minutes Total: Thigh (Right) - 90 minutes   DICTATION: .Viviann Spare Dictation  PLAN OF CARE: Discharge to home after PACU  PATIENT DISPOSITION:  PACU - hemodynamically stable.   Delay start of Pharmacological VTE agent (>24hrs) due to surgical blood loss or risk of bleeding: not applicable

## 2018-05-19 NOTE — Transfer of Care (Signed)
Immediate Anesthesia Transfer of Care Note  Patient: Victor [name]  Procedure(s) Performed: KNEE ARTHROSCOPY WITH ANTERIOR CRUCIATE LIGAMENT (ACL) REPAIR and MEDIAL MENISECTOMY (Right )  Patient Location: PACU  Anesthesia Type:General  Level of Consciousness: awake and alert   Airway & Oxygen Therapy: Patient Spontanous Breathing  Post-op Assessment: Report given to RN  Post vital signs: Reviewed and stable  Last Vitals:  Vitals Value Taken Time  BP    Temp    Pulse 95 05/19/2018 11:50 AM  Resp 16 05/19/2018 11:50 AM  SpO2 97 % 05/19/2018 11:50 AM  Vitals shown include unvalidated device data.  Last Pain:  Vitals:   05/19/18 0720  TempSrc: Oral  PainSc: 6          Complications: No apparent anesthesia complications

## 2018-05-19 NOTE — Anesthesia Procedure Notes (Signed)
Procedure Name: Intubation Date/Time: 05/19/2018 9:01 AM Performed by: Ollen Bowl, CRNA Pre-anesthesia Checklist: Patient identified, Patient being monitored, Timeout performed, Emergency Drugs available and Suction available Patient Re-evaluated:Patient Re-evaluated prior to induction Oxygen Delivery Method: Circle system utilized Preoxygenation: Pre-oxygenation with 100% oxygen Induction Type: IV induction Ventilation: Mask ventilation without difficulty Laryngoscope Size: Mac and 4 Grade View: Grade I Tube type: Oral Tube size: 7.0 mm Number of attempts: 1 Airway Equipment and Method: Stylet Placement Confirmation: ETT inserted through vocal cords under direct vision,  positive ETCO2 and breath sounds checked- equal and bilateral Secured at: 23 cm Tube secured with: Tape Dental Injury: Teeth and Oropharynx as per pre-operative assessment

## 2018-05-19 NOTE — Op Note (Signed)
05/19/2018  11:31 AM  PATIENT:  Victor [name]  41 y.o. male  PRE-OPERATIVE DIAGNOSIS:  ACL tear and Medial meniscus tear of the right knee  POST-OPERATIVE DIAGNOSIS:  ACL tear right knmee  Findings at surgery no medial meniscal tear was found meniscal remnant was intact lateral meniscectomy previously noted also intact chondral lesion lateral femoral condyle  and trochlea with a large fissure in the kneecap area  PROCEDURE:  Procedure(s): KNEE ARTHROSCOPY WITH ANTERIOR CRUCIATE LIGAMENT (ACL) ALLOGRAFT RECONSTRUCTION-[id number]  IMPLANTS  : ARTHREX 7 X 23 AND 10 X 23 BIOCOMPOSITE INTERFERENCE SCREW and Richard staple distally  Surgery was done as follows  Patient was identified in preop right knee confirmed the surgical site chart reviewed right knee marked as surgical site.  Patient taken to surgery for general anesthesia.  After general anesthesia exam under anesthesia confirmed that he had a glide on his pivot shift test on the left but is 0 with Lockman and then on the right he had a 1+ pivot shift with a 1-2+ Lockman test.  Patient was then positioned and I started working on the allograft.  The allograft was warmed with sterile saline for 30 minutes and then repaired for a 10 mm bone plug by 23 mm.  A hole was placed in the end of the graft and the graft was contoured to fit through a 10 mm bone plug and tunnel sizer and then placed on a tensioner  After timeout was taken a standard arthroscopy was performed with a lateral high portal and a central medial medial portal.  The ACL stump was identified the meniscus medially was found to be intact the lateral meniscus was found to be intact.  The ACL stump was removed from the PCL as it had scarred in.  Next with the scope in the central portal we identified the ACL footprint  This was marked with the cautery device and excess tissue was removed  We outlined and identified the tibial attachment site at the medial portion of the tibial spine  anterior to it and at the extension of the posterior portion of the lateral meniscus anterior horn.  We made an incision just medial to the and above the Pez tendons took it down to fascia we made an inverted L-shaped incision and used a periosteal elevator to expose the bone we set our tibial guide for 50 degrees advance the cannula to bone and then passed a guidewire into the tibial stop.  The first pass was not satisfactory and I repassed it to a more medial position.  We then overreamed with a 11 mm reamer and placed a tunnel plug.  We then used an over-the-top 7 mm guide to place the femoral tunnel we passed the Beath pin out the anterolateral thigh.  We then passed a 10 mm reamer up to 25 mm.  We checked the back wall for integrity and it was good.  We removed any debris from the tibial and femoral tunnels and contour the tibial tunnel with shaver.  We then took the graft and passed it through the tibial tunnel and into the femur.  Small adjustments were made to the tip of the graft to ensure adequate position in the tunnel and then passed a guidewire trailblazer and a 10 mm x 23 mm screw which was too big we removed it and replaced it with a 7 x 23 mm screw  The knee was then placed through several cycles with tension distally and then a 10  x 23 mm screw was placed in the tibial tunnel along with a [name] staple  The knee was checked for extension soft tissue was debrided from the notch area no notchplasty was performed.  The graft had good tension on it the Lockman test was stable.  The knee was irrigated and closed by closing the portals with 3-0 nylon suture and closing the tibial tunnel site with 0 Monocryl and staples followed by injection of 60 cc of Marcaine with epinephrine  Sterile dressings Ace bandage Cryo/Cuff and knee brace locked in extension was placed  SURGEON:  Surgeon(s) and Role:    * [name], MD - Primary  PHYSICIAN ASSISTANT:   ASSISTANTS: BETTY ASHLEY     ANESTHESIA:   general  EBL:  MIN    BLOOD ADMINISTERED:none  DRAINS: none   LOCAL MEDICATIONS USED:  MARCAINE     SPECIMEN:  No Specimen  DISPOSITION OF SPECIMEN:  N/A  COUNTS:  YES  TOURNIQUET:   Total Tourniquet Time Documented: Thigh (Right) - 90 minutes Total: Thigh (Right) - 90 minutes   DICTATION: .Viviann Spare Dictation  PLAN OF CARE: Discharge to home after PACU  PATIENT DISPOSITION:  PACU - hemodynamically stable.   Delay start of Pharmacological VTE agent (>24hrs) due to surgical blood loss or risk of bleeding: not applicable

## 2018-05-19 NOTE — Anesthesia Postprocedure Evaluation (Signed)
Anesthesia Post Note  Patient: Chima Astorino Alexa  Procedure(s) Performed: RIGHT KNEE ARTHROSCOPY WITH ANTERIOR CRUCIATE LIGAMENT (ACL) REPAIR and MEDIAL MENISECTOMY (Right )  Patient location during evaluation: PACU Anesthesia Type: General Level of consciousness: awake and alert and patient cooperative Pain management: satisfactory to patient Vital Signs Assessment: post-procedure vital signs reviewed and stable Respiratory status: spontaneous breathing Cardiovascular status: stable Postop Assessment: no apparent nausea or vomiting Anesthetic complications: no     Last Vitals:  Vitals:   05/19/18 1245 05/19/18 1255  BP: (!) 147/100 (!) 135/91  Pulse: 88 88  Resp: 13 16  Temp:  36.6 C  SpO2: 92% 94%    Last Pain:  Vitals:   05/19/18 1255  TempSrc: Oral  PainSc: 9                  Anchor Dwan

## 2018-05-19 NOTE — Anesthesia Preprocedure Evaluation (Signed)
Anesthesia Evaluation  Patient identified by MRN, date of birth, ID band Patient awake    Reviewed: Allergy & Precautions, H&P , NPO status , Patient's Chart, lab work & pertinent test results, reviewed documented beta blocker date and time   Airway Mallampati: III  TM Distance: >3 FB Neck ROM: full    Dental no notable dental hx. (+) Teeth Intact, Dental Advidsory Given   Pulmonary neg pulmonary ROS, Current Smoker,    Pulmonary exam normal breath sounds clear to auscultation       Cardiovascular Exercise Tolerance: Good hypertension, On Medications negative cardio ROS   Rhythm:regular Rate:Normal     Neuro/Psych negative neurological ROS  negative psych ROS   GI/Hepatic negative GI ROS, Neg liver ROS, GERD  Controlled,  Endo/Other  negative endocrine ROS  Renal/GU negative Renal ROS  negative genitourinary   Musculoskeletal   Abdominal   Peds  Hematology negative hematology ROS (+)   Anesthesia Other Findings Fit and active Skipped AM BP meds  Reproductive/Obstetrics negative OB ROS                             Anesthesia Physical Anesthesia Plan  ASA: III  Anesthesia Plan: General LMA   Post-op Pain Management:    Induction:   PONV Risk Score and Plan:   Airway Management Planned:   Additional Equipment:   Intra-op Plan:   Post-operative Plan:   Informed Consent: I have reviewed the patients History and Physical, chart, labs and discussed the procedure including the risks, benefits and alternatives for the proposed anesthesia with the patient or authorized representative who has indicated his/her understanding and acceptance.   Dental Advisory Given  Plan Discussed with: CRNA and Anesthesiologist  Anesthesia Plan Comments:         Anesthesia Quick Evaluation

## 2018-05-19 NOTE — Progress Notes (Signed)
Voices need to void. Refuses urinal. Wants to go to [facility]. Up to w/c. To [facility].  Voided without difficulty. Tolerated well.

## 2018-05-19 NOTE — Discharge Instructions (Signed)
Again use the Cryo/Cuff 6 times a day for 30 minutes  When you walk walk with the brace and the crutches apply full weightbearing as tolerated  Your surgery was very successful and went as planned you had an old ACL tear as you know it was only a stump of tissue left attached to your PCL.  We remove the stump of ACL tissue and built a new ligament.  The meniscal tear on MRI was found to still be intact and looked like a tear because of the previous resection.   Anterior Cruciate Ligament Reconstruction, Care After Refer to this sheet in the next few weeks. These instructions provide you with information about caring for yourself after your procedure. Your health care provider Freehling also give you more specific instructions. Your treatment has been planned according to current medical practices, but problems sometimes occur. Call your health care provider if you have any problems or questions after your procedure. What can I expect after the procedure? After your procedure, it is common to have soreness in the area where the surgical cut (incision) was made. Follow these instructions at home: If you have a splint or brace:  Wear the splint or brace as told by your health care provider. Remove it only as told by your health care provider.  Loosen the splint or brace if your toes tingle, become numb, or turn cold and blue.  Do not let your splint or brace get wet if it is not waterproof.  Keep the splint or brace clean. Bathing  Do not take baths, swim, or use a hot tub until your health care provider approves. Ask your health care provider if you can take showers.  If your splint or brace is not waterproof, cover it with a watertight plastic bag when you take a bath or a shower.  Keep the bandage (dressing) dry until your health care provider says it can be removed. Incision care  Follow instructions from your health care provider about how to take care of your incision. Make sure  you: ? Wash your hands with soap and water before you change your bandage. If soap and water are not available, use hand sanitizer. ? Change your dressing as told by your health care provider. ? Leave stitches (sutures), skin glue, or adhesive strips in place. These skin closures Curenton need to stay in place for 2 weeks or longer. If adhesive strip edges start to loosen and curl up, you Schwinn trim the loose edges. Do not remove adhesive strips completely unless your health care provider tells you to do that.  Check your incision area every day for signs of infection. Check for: ? More redness, swelling, or pain. ? More fluid or blood. ? Warmth. ? Pus or a bad smell. Managing pain, stiffness, and swelling  If directed, apply ice to the affected area: ? Put ice in a plastic bag. ? Place a towel between your skin and the bag. ? Leave the ice on for 20 minutes, 2-3 times per day.  Move your toes often to avoid stiffness and to lessen swelling.  Raise (elevate) the affected area above the level of your heart while you are sitting or lying down. Driving  Do not drive or operate heavy machinery while taking prescription pain medicine.  Do not drive for 24 hours if you received a sedative.  Ask your health care provider when it is safe to drive if you have a splint or brace on your leg. Activity  Return to your normal activities as told by your health care provider. Ask your health care provider what activities are safe for you.  Do not exercise your leg unless instructed to do so by your health care provider. General instructions  Do not use the injured limb to support your body weight until your health care provider says that you can. Use crutches as told by your health care provider.  Take over-the-counter and prescription medicines only as told by your health care provider.  Keep all follow-up visits as told by your health care provider. This is important. Contact a health care provider  if:  You have more redness, swelling, or pain around your incision.  You have more fluid or blood coming from your incision.  Your incision feels warm to the touch.  You have pus or a bad smell coming from your incision.  Any of your incisions break open after sutures or staples have been removed. Get help right away if:  You feel pain when you move your knee.  You have a lot of pain in your leg when you move your foot up and down at your ankle.  You have a fever. This information is not intended to replace advice given to you by your health care provider. Make sure you discuss any questions you have with your health care provider. Document Released: 03/01/2005 Document Revised: 07/05/2016 Document Reviewed: 05/07/2015 Elsevier Interactive Patient Education  2017 [employer]   PATIENT INSTRUCTIONS POST-ANESTHESIA  IMMEDIATELY FOLLOWING SURGERY:  Do not drive or operate machinery for the first twenty four hours after surgery.  Do not make any important decisions for twenty four hours after surgery or while taking narcotic pain medications or sedatives.  If you develop intractable nausea and vomiting or a severe headache please notify your doctor immediately.  FOLLOW-UP:  Please make an appointment with your surgeon as instructed. You do not need to follow up with anesthesia unless specifically instructed to do so.  WOUND CARE INSTRUCTIONS (if applicable):  Keep a dry clean dressing on the anesthesia/puncture wound site if there is drainage.  Once the wound has quit draining you Prust leave it open to air.  Generally you should leave the bandage intact for twenty four hours unless there is drainage.  If the epidural site drains for more than 36-48 hours please call the anesthesia department.  QUESTIONS?:  Please feel free to call your physician or the hospital operator if you have any questions, and they will be happy to assist you.      Incision Care, Adult An incision is a cut  that a doctor makes in your skin for surgery (for a procedure). Most times, these cuts are closed after surgery. Your cut from surgery Vazquez be closed with stitches (sutures), staples, skin glue, or skin tape (adhesive strips). You Economos need to return to your doctor to have stitches or staples taken out. This Callins happen many days or many weeks after your surgery. The cut needs to be well cared for so it does not get infected. How to care for your cut Cut care  Follow instructions from your doctor about how to take care of your cut. Make sure you: ? Wash your hands with soap and water before you change your bandage (dressing). If you cannot use soap and water, use hand sanitizer. ? Change your bandage as told by your doctor. ? Leave stitches, skin glue, or skin tape in place. They Wold need to stay in place for  2 weeks or longer. If tape strips get loose and curl up, you Lapinski trim the loose edges. Do not remove tape strips completely unless your doctor says it is okay.  Check your cut area every day for signs of infection. Check for: ? More redness, swelling, or pain. ? More fluid or blood. ? Warmth. ? Pus or a bad smell.  Ask your doctor how to clean the cut. This Gergely include: ? Using mild soap and water. ? Using a clean towel to pat the cut dry after you clean it. ? Putting a cream or ointment on the cut. Do this only as told by your doctor. ? Covering the cut with a clean bandage.  Ask your doctor when you can leave the cut uncovered.  Do not take baths, swim, or use a hot tub until your doctor says it is okay. Ask your doctor if you can take showers. You Ysaguirre only be allowed to take sponge baths for bathing. Medicines  If you were prescribed an antibiotic medicine, cream, or ointment, take the antibiotic or put it on the cut as told by your doctor. Do not stop taking or putting on the antibiotic even if your condition gets better.  Take over-the-counter and prescription medicines only as  told by your doctor. General instructions  Limit movement around your cut. This helps healing. ? Avoid straining, lifting, or exercise for the first month, or for as long as told by your doctor. ? Follow instructions from your doctor about going back to your normal activities. ? Ask your doctor what activities are safe.  Protect your cut from the sun when you are outside for the first 6 months, or for as long as told by your doctor. Put on sunscreen around the scar or cover up the scar.  Keep all follow-up visits as told by your doctor. This is important. Contact a doctor if:  Your have more redness, swelling, or pain around the cut.  You have more fluid or blood coming from the cut.  Your cut feels warm to the touch.  You have pus or a bad smell coming from the cut.  You have a fever or shaking chills.  You feel sick to your stomach (nauseous) or you throw up (vomit).  You are dizzy.  Your stitches or staples come undone. Get help right away if:  You have a red streak coming from your cut.  Your cut bleeds through the bandage and the bleeding does not stop with gentle pressure.  The edges of your cut open up and separate.  You have very bad (severe) pain.  You have a rash.  You are confused.  You pass out (faint).  You have trouble breathing and you have a fast heartbeat. This information is not intended to replace advice given to you by your health care provider. Make sure you discuss any questions you have with your health care provider. Document Released: 11/04/2011 Document Revised: 04/19/2016 Document Reviewed: 04/19/2016 Elsevier Interactive Patient Education  2017 [employer] Inc.   Hypertension Hypertension is another name for high blood pressure. High blood pressure forces your heart to work harder to pump blood. This can cause problems over time. There are two numbers in a blood pressure reading. There is a top number (systolic) over a bottom number  (diastolic). It is best to have a blood pressure below 120/80. Healthy choices can help lower your blood pressure. You Kahl need medicine to help lower your blood pressure if:  Your  blood pressure cannot be lowered with healthy choices.  Your blood pressure is higher than 130/80.  Follow these instructions at home: Eating and drinking  If directed, follow the DASH eating plan. This diet includes: ? Filling half of your plate at each meal with fruits and vegetables. ? Filling one quarter of your plate at each meal with whole grains. Whole grains include whole wheat pasta, brown rice, and whole grain bread. ? Eating or drinking low-fat dairy products, such as skim milk or low-fat yogurt. ? Filling one quarter of your plate at each meal with low-fat (lean) proteins. Low-fat proteins include fish, skinless chicken, eggs, beans, and tofu. ? Avoiding fatty meat, cured and processed meat, or chicken with skin. ? Avoiding premade or processed food.  Eat less than 1,500 mg of salt (sodium) a day.  Limit alcohol use to no more than 1 drink a day for nonpregnant women and 2 drinks a day for men. One drink equals 12 oz of beer, 5 oz of wine, or 1 oz of hard liquor. Lifestyle  Work with your doctor to stay at a healthy weight or to lose weight. Ask your doctor what the best weight is for you.  Get at least 30 minutes of exercise that causes your heart to beat faster (aerobic exercise) most days of the week. This Naraine include walking, swimming, or biking.  Get at least 30 minutes of exercise that strengthens your muscles (resistance exercise) at least 3 days a week. This Lowery include lifting weights or pilates.  Do not use any products that contain nicotine or tobacco. This includes cigarettes and e-cigarettes. If you need help quitting, ask your doctor.  Check your blood pressure at home as told by your doctor.  Keep all follow-up visits as told by your doctor. This is important. Medicines  Take  over-the-counter and prescription medicines only as told by your doctor. Follow directions carefully.  Do not skip doses of blood pressure medicine. The medicine does not work as well if you skip doses. Skipping doses also puts you at risk for problems.  Ask your doctor about side effects or reactions to medicines that you should watch for. Contact a doctor if:  You think you are having a reaction to the medicine you are taking.  You have headaches that keep coming back (recurring).  You feel dizzy.  You have swelling in your ankles.  You have trouble with your vision. Get help right away if:  You get a very bad headache.  You start to feel confused.  You feel weak or numb.  You feel faint.  You get very bad pain in your: ? Chest. ? Belly (abdomen).  You throw up (vomit) more than once.  You have trouble breathing. Summary  Hypertension is another name for high blood pressure.  Making healthy choices can help lower blood pressure. If your blood pressure cannot be controlled with healthy choices, you Pecina need to take medicine. This information is not intended to replace advice given to you by your health care provider. Make sure you discuss any questions you have with your health care provider. Document Released: 01/29/2008 Document Revised: 07/10/2016 Document Reviewed: 07/10/2016 Elsevier Interactive Patient Education  [employer].

## 2018-05-20 ENCOUNTER — Telehealth: Payer: Self-pay | Admitting: Orthopedic Surgery

## 2018-05-20 NOTE — Telephone Encounter (Signed)
Patient and spouse (designated party contact on file) has questions regarding hurting at surgical site, which assumes is normal, but has tingling around foot/ankle. Also questions about bandages - whether to change. Post op visit is scheduled 05/27/18. Please call PH# [phone].

## 2018-05-20 NOTE — Telephone Encounter (Signed)
I spoke to wife, offered reassurance, the tingling is when he stands up, advised this will gradually improve, Szczesny be from surgery, Piltz be from swelling. Encouraged him to ice it, reduce activity, come in for follow up as scheduled.   FYI

## 2018-05-21 ENCOUNTER — Encounter (HOSPITAL_COMMUNITY): Payer: Self-pay | Admitting: Orthopedic Surgery

## 2018-05-27 ENCOUNTER — Ambulatory Visit (INDEPENDENT_AMBULATORY_CARE_PROVIDER_SITE_OTHER): Payer: Medicaid Other | Admitting: Orthopedic Surgery

## 2018-05-27 VITALS — BP 142/95 | HR 77 | Ht 71.0 in | Wt 240.0 lb

## 2018-05-27 DIAGNOSIS — Z9889 Other specified postprocedural states: Secondary | ICD-10-CM

## 2018-05-27 MED ORDER — OXYCODONE-ACETAMINOPHEN 7.5-325 MG PO TABS: 1.0000 | ORAL_TABLET | ORAL | 0 refills | Status: DC | PRN

## 2018-05-27 NOTE — Progress Notes (Signed)
POSTOP VISIT  POD # 8  Chief Complaint  Patient presents with  . Follow-up    Post op on right knee, DOS 05/19/18.    05/19/2018  11:31 AM  PATIENT:  Victor [name]  42 y.o. male  PRE-OPERATIVE DIAGNOSIS:  ACL tear and Medial meniscus tear of the right knee  POST-OPERATIVE DIAGNOSIS:  ACL tear right knmee PROCEDURE:  Procedure(s): KNEE ARTHROSCOPY WITH ANTERIOR CRUCIATE LIGAMENT (ACL) ALLOGRAFT RECONSTRUCTION  IMPLANTS  : ARTHREX 7 X 23 AND 10 X 23 BIOCOMPOSITE INTERFERENCE SCREW   SURGEON:  Surgeon(s) and Role:    [name], MD - Primary   Encounter Diagnosis  Name Primary?  . S/P ACL repair right 05/19/18 Yes   Complains of shin pain and numbness on the medial side of his leg and top of his foot  Postoperative plan (Work, [employer],  Meds ordered this encounter  Medications  . oxyCODONE-acetaminophen (PERCOCET) 7.5-325 MG tablet    Sig: Take 1 tablet by mouth every 4 (four) hours as needed for severe pain.    Dispense:  42 tablet    Refill:  0  ,FU)  Subcutaneous bruise over the shin tender no evidence of calf pain or calf tenderness sensation loss on the medial side of the foot and dorsum of the foot global nondermatomal does not go with the anatomy of the surgery however will monitor  Come back in a week to remove sutures and staples and start therapy.  Medication refilled.  Advised ibuprofen for the shin

## 2018-05-27 NOTE — Patient Instructions (Signed)
Continue post op instructions

## 2018-06-03 ENCOUNTER — Encounter: Payer: Self-pay | Admitting: Orthopedic Surgery

## 2018-06-03 ENCOUNTER — Ambulatory Visit (INDEPENDENT_AMBULATORY_CARE_PROVIDER_SITE_OTHER): Payer: Medicaid Other | Admitting: Orthopedic Surgery

## 2018-06-03 VITALS — Ht 71.0 in | Wt 240.0 lb

## 2018-06-03 DIAGNOSIS — Z9889 Other specified postprocedural states: Secondary | ICD-10-CM

## 2018-06-03 MED ORDER — OXYCODONE-ACETAMINOPHEN 5-325 MG PO TABS: 1.0000 | ORAL_TABLET | ORAL | 0 refills | Status: DC | PRN

## 2018-06-03 NOTE — Progress Notes (Signed)
POST OP VISIT   Patient ID: Victor [name], male   DOB: Jan 28, 1976, 42 y.o.   MRN: [id number]  Chief Complaint  Patient presents with  . Post-op Follow-up    right ACL repair 05/19/18    Encounter Diagnosis  Name Primary?  . S/P ACL repair right 05/19/18 Yes   Pretibial area is improving  Staples were removed  The knee looks very good   Start Pt   Meds ordered this encounter  Medications  . oxyCODONE-acetaminophen (PERCOCET/ROXICET) 5-325 MG tablet    Sig: Take 1 tablet by mouth every 4 (four) hours as needed for up to 7 days for severe pain.    Dispense:  42 tablet    Refill:  0    Fu in 4 weeks

## 2018-06-09 ENCOUNTER — Encounter (HOSPITAL_COMMUNITY): Payer: Self-pay

## 2018-06-09 ENCOUNTER — Other Ambulatory Visit: Payer: Self-pay | Admitting: Orthopedic Surgery

## 2018-06-09 ENCOUNTER — Ambulatory Visit (HOSPITAL_COMMUNITY): Payer: Medicaid Other | Attending: Orthopedic Surgery

## 2018-06-09 ENCOUNTER — Other Ambulatory Visit: Payer: Self-pay

## 2018-06-09 DIAGNOSIS — M6281 Muscle weakness (generalized): Secondary | ICD-10-CM | POA: Insufficient documentation

## 2018-06-09 DIAGNOSIS — R2689 Other abnormalities of gait and mobility: Secondary | ICD-10-CM | POA: Insufficient documentation

## 2018-06-09 DIAGNOSIS — M25561 Pain in right knee: Secondary | ICD-10-CM | POA: Diagnosis not present

## 2018-06-09 DIAGNOSIS — Z9889 Other specified postprocedural states: Secondary | ICD-10-CM

## 2018-06-09 MED ORDER — OXYCODONE-ACETAMINOPHEN 5-325 MG PO TABS: 1.0000 | ORAL_TABLET | Freq: Four times a day (QID) | ORAL | 0 refills | Status: AC | PRN

## 2018-06-09 NOTE — Telephone Encounter (Signed)
Patient requests refill of: oxyCODONE-acetaminophen (PERCOCET/ROXICET) 5-325 MG tablet 42 tablet  -[facility], [address], [place]

## 2018-06-09 NOTE — Therapy (Signed)
[place], [place], [address] Phone: [phone]   Fax:  [phone]  Physical Therapy Evaluation  Patient Details  Name: Victor [name] MRN: [id number] Date of Birth: 10-09-1975 Referring Provider (PT): Adonis Huguenin, MD   Encounter Date: 06/09/2018  PT End of Session - 06/09/18 1429    Visit Number  1    Number of Visits  19    Date for PT Re-Evaluation  07/21/18   06/30/18 is mini-reassessment   Authorization Type  Medicaid [place]     Authorization Time Period  06/09/18 - 07/24/18    Authorization - Visit Number  0    Authorization - Number of Visits  3    PT Start Time  3790    PT Stop Time  1426    PT Time Calculation (min)  39 min    Equipment Utilized During Treatment  Other (comment)   Rt knee brace   Activity Tolerance  Patient tolerated treatment well    Behavior During Therapy  [facility] for tasks assessed/performed       Past Medical History:  Diagnosis Date  . GERD (gastroesophageal reflux disease)   . History of stomach ulcers   . Hypertension     Past Surgical History:  Procedure Laterality Date  . ANTERIOR CRUCIATE LIGAMENT REPAIR Right 05/19/2018   Procedure: RIGHT KNEE ARTHROSCOPY WITH ANTERIOR CRUCIATE LIGAMENT (ACL) REPAIR and MEDIAL MENISECTOMY;  Surgeon: [name], MD;  Location: [facility];  Service: Orthopedics;  Laterality: Right;  . APPENDECTOMY    . KNEE ARTHROSCOPY WITH MEDIAL MENISECTOMY Right 05/29/2017   Procedure: KNEE ARTHROSCOPY WITH MEDIAL MENISECTOMY and lateral meniscectomy;  Surgeon: [name], MD;  Location: [facility];  Service: Orthopedics;  Laterality: Right;  . KNEE ARTHROSCOPY WITH MEDIAL MENISECTOMY Right 02/05/2018   Procedure: RIGHT KNEE ARTHROSCOPY WITH PARTIAL MEDIAL AND LATERAL MENISECTOMY;  Surgeon: [name], MD;  Location: [facility];  Service: Orthopedics;  Laterality: Right;    There were no vitals filed for this visit.   Subjective  Assessment - 06/09/18 1350    Subjective  Patient arrives with Bledso brace on Rt knee. He states he saw Dr. Aline Brochure last Wednesday and had his stitches removed. He walked for him without the brace and without his crutches. He understood Dr. Aline Brochure to have cleared him to walk without crutches and that he is full weight bearing on his Rt LE. He states he has been icing his knee for abou 15 minutes every hour or so.     Pertinent History  Rt ACL repair 05/19/18 (2 prior minesectomies)    How long can you sit comfortably?  not limited    How long can you stand comfortably?  5 minutes it starts aching    How long can you walk comfortably?  no problem with pain    Patient Stated Goals  Get back to work - 8 hours squatting lifting walking,  ([place]) lift up to 80 lbs    Currently in Pain?  Yes    Pain Score  3     Pain Location  Knee    Pain Orientation  Right    Pain Descriptors / Indicators  Aching    Pain Type  Surgical pain    Pain Onset  1 to 4 weeks ago    Pain Frequency  Intermittent    Aggravating Factors   walking    Pain Relieving Factors  ice, pain medication  [facility] Adult PT Assessment - 06/09/18 0001    Assessment  Medical Diagnosis a/p ACL repair  Referring Provider (PT) Adonis Huguenin, MD  Onset Date/Surgical Date 05/19/18  Hand Dominance Right  Next MD Visit 07/01/18 (approximate)  Prior Therapy none  Precautions  Precautions Knee  Precaution Comments ACL Repair protocol Aline Brochure)  Required Braces or Orthoses Other Brace/Splint  Other Brace/Splint Bledso brace 0-90 on Rt knee  Restrictions  Weight Bearing Restrictions No  Other Position/Activity Restrictions [place] residence  Living Arrangements Children;Spouse/significant other  Available Help at Discharge Family  Type of [place] to enter  Entrance Stairs-Number of Steps 6  Entrance Stairs-Rails Can reach both  [place] Two  level  Alternate Level Stairs-Number of Steps 18  Alternate Level Stairs-Rails Left (going up)  [place] - 2 wheels;Crutches  Additional Comments sleeping downstairs right now because stairs feel difficulty  Prior Function  Level of Independence Independent  Vocation Full time employment  Vocation Requirements 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, squatting, lifting, mowing, etc  Leisure Basketball, Softball/Baseball  Cognition  Overall Cognitive Status Within Functional Limits for tasks assessed  Observation/Other Assessments  Observations skin irritation along anterior tibia of Rt LE, dry and patchy  Other Surveys  Other Surveys  Lower Extremity Functional Scale  30/80  Observation/Other Assessments-Edema   Edema Circumferential  Circumferential Edema  Circumferential - Right 17.5" at knee joint line  Circumferential - Left  16.5" at knee joint line  ROM / Strength  AROM / PROM / Strength AROM;Strength  AROM  AROM Assessment Site Knee  Right/Left Knee Right  Right Knee Extension 7  Right Knee Flexion 95  Strength  Strength Assessment Site Hip;Knee;Ankle  Right/Left Knee Right;Left  Right Knee Flexion 2+/5 (pt limited by pain)  Right Knee Extension 3+/5  Left Knee Flexion 5/5  Left Knee Extension 5/5  Right Ankle Dorsiflexion 5/5  Left Ankle Dorsiflexion 5/5  Right Hip Flexion 4/5  Right Hip Extension 4+/5  Right Hip ABduction 4/5  Left Hip Flexion 5/5  Left Hip Extension 5/5  Left Hip ABduction 5/5  Ambulation/Gait  Ambulation/Gait Yes  Ambulation/Gait Assistance 7: Independent  Ambulation Distance (Feet) 346 Feet (2MWT)  Assistive device None  Gait Pattern Step-through pattern;Decreased stance time - right;Decreased stride length;Decreased hip/knee flexion - right;Decreased weight shift to right;Antalgic  Ambulation Surface Level;Indoor  Gait velocity 0.86 m/s  Stairs Yes  Stairs Assistance 7: Independent  Stair  Management Technique One rail Right;One rail Left;Step to pattern;Forwards  Number of Stairs 4  Height of Stairs 6  Gait Comments Educated patient on stair management technique to ascend with Lt and descend leading with Rt LE     Objective measurements completed on examination: See above findings.    [facility] Adult PT Treatment/Exercise - 06/09/18 0001      Exercises   Exercises  Knee/Hip      Knee/Hip Exercises: Standing   Knee Flexion  Right;1 set;5 reps   patient felt limited by brace     Knee/Hip Exercises: Supine   Quad Sets  Right;1 set;10 reps    Quad Sets Limitations  towel under knee    Heel Slides  Right;1 set;10 reps       PT Education - 06/09/18 1807    Education Details  Educated on appropiate plan for therapy and on initial HEP.    Person(s) Educated  Patient  Methods  Explanation;Handout    Comprehension  Verbalized understanding       PT Short Term Goals - 06/09/18 1804      PT SHORT TERM GOAL #1   Title  Patient will be independent with HEP, updated PRN based on stage in protocol, to promote ACL repair healing and stability of Rt knee to return to work activitites and PLOF.    Time  1    Period  Weeks    Status  New    Target Date  06/16/18      PT SHORT TERM GOAL #2   Title  Patient will achieve 0-115 Rt knee ROM up to WNL's or equal to Lt LE for improved mobility with gait and stairs and symmetrical mechanics.     Time  3    Period  Weeks    Status  New    Target Date  06/30/18      PT SHORT TERM GOAL #3   Title  Patient will improve MMT by 1/2 grade for all limited groups to inidicate signifiacant improvement in functional LE strength.    Time  3    Period  Weeks    Status  New      PT SHORT TERM GOAL #4   Title  Patient will have reduced edema of Rt knee and circumference of knee joints will be equal bil LE's to indicate reduced swelling ofr improve ROM, muscle activation, and decresaed pain.     Time  3    Period  Weeks    Status  New         PT Long Term Goals - 06/09/18 1805      PT LONG TERM GOAL #1   Title  Patient will improve MMT by 1 grade for all limited groups to inidicate signifiacant improvement in functional LE strength.    Time  6    Period  Weeks    Status  New    Target Date  07/21/18      PT LONG TERM GOAL #2   Title  Patient will achieve full Rt knee ROM up to WNL's or equal to Lt LE for improved mobility with gait and stairs and symmetrical mechanics.     Time  6    Period  Weeks    Status  New      PT LONG TERM GOAL #3   Title  Patient will demonstrate safe squat mechanics to progress closed chain exercises within protocol while protecting ACL repair to improve strenght and reudce risk for re-injury.    Time  6    Period  Weeks    Status  New        Plan - 06/09/18 1803    Clinical Impression Statement  Mr. Sloan presents for initial evaluation 3 weeks s/p ACL reconstruction. He is currently ambulating in bledso brace without crutches and is locked form 0-90 for AROM in the brace. He had his stiches removed last week and incisions are healing well with no signs of infection locally or systemically. He presents with limited ROM, strength, activity tolerance, impaired gait, and impaired balance. 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 He will benefit from skilled physical therapy interventions to address impairments and progress through  Post surgical ACL rehab protocol to improve function while protecting his ACL to return to work/PLOF safely.     Clinical Presentation  Stable  Clinical Presentation due to:  ROM, MMT, 2MWT, SLS, clinical judgement, LEFS    Clinical Decision Making  Low    Rehab Potential  Good    PT Frequency  3x / week    PT Duration  6 weeks    PT Treatment/Interventions  ADLs/Self Care Home Management;Aquatic Therapy;Cryotherapy;Electrical Stimulation;Gait training;Functional mobility  Scientist, forensic;Therapeutic activities;Therapeutic exercise;Balance training;Neuromuscular re-education;Patient/family education;Manual techniques;Passive range of motion;Taping;Scar mobilization    PT Next Visit Plan  Review goals and eval. Follow ACL protocol per Dr. Aline Brochure stage II. Focus on quad set/quad activaiton in 0-10 degrees, do not initiate LAQ or SAQ due to lever arm forces. Initiate bike for AROM and prone/standing hamstring curls.     PT Home Exercise Plan  Eval: heel slide, quad set, knee flexion standing.     Consulted and Agree with Plan of Care  Patient       Patient will benefit from skilled therapeutic intervention in order to improve the following deficits and impairments:  Abnormal gait, Decreased skin integrity, Pain, Decreased mobility, Decreased scar mobility, Decreased strength, Decreased range of motion, Decreased endurance, Decreased activity tolerance, Decreased balance, Difficulty walking, Increased edema, Impaired flexibility  Visit Diagnosis: Acute pain of right knee  Muscle weakness (generalized)  Other abnormalities of gait and mobility     Problem List Patient Active Problem List   Diagnosis Date Noted  . S/P right knee arthroscopy 02/05/18 06/23/2017  . S/P ACL repair right 05/19/18   . Chondromalacia of lateral femoral condyle, right   . Chondromalacia, patella, right     Kipp Brood, PT, DPT Physical Therapist with [facility]  06/09/2018 6:08 PM    [place] [address] [place], [place], [address] Phone: [phone]   Fax:  [phone]  Name: Victor [name] MRN: [id number] Date of Birth: Jan 31, 1976

## 2018-06-11 ENCOUNTER — Ambulatory Visit (HOSPITAL_COMMUNITY): Payer: Medicaid Other | Admitting: Physical Therapy

## 2018-06-11 DIAGNOSIS — R2689 Other abnormalities of gait and mobility: Secondary | ICD-10-CM | POA: Diagnosis not present

## 2018-06-11 DIAGNOSIS — M25561 Pain in right knee: Secondary | ICD-10-CM | POA: Diagnosis not present

## 2018-06-11 DIAGNOSIS — M6281 Muscle weakness (generalized): Secondary | ICD-10-CM | POA: Diagnosis not present

## 2018-06-11 NOTE — Therapy (Signed)
[place], [place], [address] Phone: [phone]   Fax:  [phone]  Physical Therapy Treatment  Patient Details  Name: Victor [name], Victor [name] Referring Provider (PT): Adonis Huguenin, MD   Encounter Date: 06/11/2018  PT End of Session - 06/11/18 1737    Visit Number  2    Number of Visits  19    Date for PT Re-Evaluation  11/[name]/19   06/30/18 is mini-reassessment   Authorization Type  Medicaid [place]     Authorization Time Period  06/09/18 - 07/24/18.  3 visits approved 10/17-10/23    Authorization - Visit Number  1    Authorization - Number of Visits  3    PT Start Time  1645    PT Stop Time  1725    PT Time Calculation (min)  40 min    Equipment Utilized During Treatment  Other (comment)   Rt knee brace   Activity Tolerance  Patient tolerated treatment well    Behavior During Therapy  [facility] for tasks assessed/performed       Past Medical History:  Diagnosis Date  . GERD (gastroesophageal reflux disease)   . History of stomach ulcers   . Hypertension     Past Surgical History:  Procedure Laterality Date  . ANTERIOR CRUCIATE LIGAMENT REPAIR Right 05/19/2018   Procedure: RIGHT KNEE ARTHROSCOPY WITH ANTERIOR CRUCIATE LIGAMENT (ACL) REPAIR and MEDIAL MENISECTOMY;  Surgeon: [name], MD;  Location: [facility];  Service: Orthopedics;  Laterality: Right;  . APPENDECTOMY    . KNEE ARTHROSCOPY WITH MEDIAL MENISECTOMY Right 05/29/2017   Procedure: KNEE ARTHROSCOPY WITH MEDIAL MENISECTOMY and lateral meniscectomy;  Surgeon: [name], MD;  Location: [facility];  Service: Orthopedics;  Laterality: Right;  . KNEE ARTHROSCOPY WITH MEDIAL MENISECTOMY Right 02/05/2018   Procedure: RIGHT KNEE ARTHROSCOPY WITH PARTIAL MEDIAL AND LATERAL MENISECTOMY;  Surgeon: [name], MD;  Location: [facility];  Service: Orthopedics;  Laterality: Right;    There were no vitals filed for  this visit.  Subjective Assessment - 06/11/18 1649    Subjective  Pt states he is sore today in the front of his knee.  States he has more pain at night, around 6/10.      Currently in Pain?  No/denies                       [facility] Adult PT Treatment/Exercise - 06/11/18 0001      Knee/Hip Exercises: Stretches   Knee: Self-Stretch to increase Flexion  Right;10 seconds;Limitations    Knee: Self-Stretch Limitations  10 reps onto 12"      Knee/Hip Exercises: Standing   Heel Raises  Both;10 reps    Knee Flexion  Right;10 reps;Limitations      Knee/Hip Exercises: Supine   Quad Sets  Right;1 set;10 reps    Heel Slides  Right;1 set;10 reps    Straight Leg Raises  Right;15 reps    Knee Extension  AROM    Knee Extension Limitations  5    Knee Flexion  AROM    Knee Flexion Limitations  115      Knee/Hip Exercises: Sidelying   Hip ABduction  Right;15 reps      Knee/Hip Exercises: Prone   Hamstring Curl  15 reps    Hip Extension  Right;15 reps      Manual Therapy   Manual Therapy  Edema management  Manual therapy comments  completed seperate form all other skilled interventions    Edema Management  retro massage Rt knee             PT Education - 06/11/18 1726    Education Details  reveiwed HEP and goals per initial evaluation.      Person(s) Educated  Patient    Methods  Explanation;Demonstration;Tactile cues;Verbal cues    Comprehension  Verbalized understanding;Returned demonstration;Verbal cues required;Tactile cues required       PT Short Term Goals - 06/11/18 1723      PT SHORT TERM GOAL #1   Title  Patient will be independent with HEP, updated PRN based on stage in protocol, to promote ACL repair healing and stability of Rt knee to return to work activitites and PLOF.    Time  1    Period  Weeks    Status  On-going      PT SHORT TERM GOAL #2   Title  Patient will achieve 0-115 Rt knee ROM up to WNL's or equal to Lt LE for improved mobility with  gait and stairs and symmetrical mechanics.     Baseline  ROM 5-115 10/17    Time  3    Period  Weeks    Status  Partially Met      PT SHORT TERM GOAL #3   Title  Patient will improve MMT by 1/2 grade for all limited groups to inidicate signifiacant improvement in functional LE strength.    Time  3    Period  Weeks    Status  On-going      PT SHORT TERM GOAL #4   Title  Patient will have reduced edema of Rt knee and circumference of knee joints will be equal bil LE's to indicate reduced swelling ofr improve ROM, muscle activation, and decresaed pain.     Time  3    Period  Weeks    Status  On-going        PT Long Term Goals - 06/11/18 1725      PT LONG TERM GOAL #1   Title  Patient will improve MMT by 1 grade for all limited groups to inidicate signifiacant improvement in functional LE strength.    Time  6    Period  Weeks    Status  On-going      PT LONG TERM GOAL #2   Title  Patient will achieve full Rt knee ROM up to WNL's or equal to Lt LE for improved mobility with gait and stairs and symmetrical mechanics.     Time  6    Period  Weeks    Status  On-going      PT LONG TERM GOAL #3   Title  Patient will demonstrate safe squat mechanics to progress closed chain exercises within protocol while protecting ACL repair to improve strenght and reudce risk for re-injury.    Time  6    Period  Weeks    Status  On-going            Plan - 06/11/18 1747    Clinical Impression Statement  Pt returns today reporting compliance with his HEP, wearing his bledso brace without crutches.  More pain at night, just overall soreness from the therex.  Reviewed goals and HEP given at evaluation.  Added additional therex per protocol without pain and good form.  Retro massage completed for edema with improved AROM today 5-115 (was 7-95 last visit).  Rehab Potential  Good    PT Frequency  3x / week    PT Duration  6 weeks    PT Treatment/Interventions  ADLs/Self Care Home  Management;Aquatic Therapy;Cryotherapy;Electrical Stimulation;Gait training;Functional mobility Scientist, forensic;Therapeutic activities;Therapeutic exercise;Balance training;Neuromuscular re-education;Patient/family education;Manual techniques;Passive range of motion;Taping;Scar mobilization    PT Next Visit Plan  Follow ACL protocol per Dr. Aline Brochure stage II. Focus on quad set/quad activaiton in 0-10 degrees, do not initiate LAQ or SAQ due to lever arm forces. Initiate bike for AROM     PT Home Exercise Plan  Eval: heel slide, quad set, knee flexion standing.     Consulted and Agree with Plan of Care  Patient       Patient will benefit from skilled therapeutic intervention in order to improve the following deficits and impairments:  Abnormal gait, Decreased skin integrity, Pain, Decreased mobility, Decreased scar mobility, Decreased strength, Decreased range of motion, Decreased endurance, Decreased activity tolerance, Decreased balance, Difficulty walking, Increased edema, Impaired flexibility  Visit Diagnosis: Acute pain of right knee  Muscle weakness (generalized)  Other abnormalities of gait and mobility     Problem List Patient Active Problem List   Diagnosis Date Noted  . S/P right knee arthroscopy 02/05/18 06/23/2017  . S/P ACL repair right 05/19/18   . Chondromalacia of lateral femoral condyle, right   . Chondromalacia, patella, right     Teena Irani 06/11/2018, 5:55 PM  [place] [address] [place], [place], [address] Phone: [phone]   Fax:  [phone]  Name: Victor [name]

## 2018-06-15 ENCOUNTER — Telehealth (HOSPITAL_COMMUNITY): Payer: Self-pay | Admitting: Family Medicine

## 2018-06-15 ENCOUNTER — Ambulatory Visit (HOSPITAL_COMMUNITY): Payer: Medicaid Other | Admitting: Physical Therapy

## 2018-06-15 NOTE — Telephone Encounter (Signed)
06/15/18  wife called to cx appt but no reason was give

## 2018-06-17 ENCOUNTER — Other Ambulatory Visit: Payer: Self-pay

## 2018-06-17 ENCOUNTER — Ambulatory Visit (HOSPITAL_COMMUNITY): Payer: Medicaid Other

## 2018-06-17 ENCOUNTER — Encounter (HOSPITAL_COMMUNITY): Payer: Self-pay

## 2018-06-17 DIAGNOSIS — R2689 Other abnormalities of gait and mobility: Secondary | ICD-10-CM | POA: Diagnosis not present

## 2018-06-17 DIAGNOSIS — M6281 Muscle weakness (generalized): Secondary | ICD-10-CM | POA: Diagnosis not present

## 2018-06-17 DIAGNOSIS — M25561 Pain in right knee: Secondary | ICD-10-CM | POA: Diagnosis not present

## 2018-06-17 NOTE — Therapy (Signed)
[place], [place], [address] Phone: [phone]   Fax:  [phone]  Physical Therapy Treatment  Patient Details  Name: Victor [name] MRN: [id number] Date of Birth: 1976-01-28 Referring Provider (PT): Adonis Huguenin, MD   Encounter Date: 06/17/2018  PT End of Session - 06/17/18 0911    Visit Number  3    Number of Visits  19    Date for PT Re-Evaluation  07/21/18   06/30/18 is mini-reassessment   Authorization Type  Medicaid [place]     Authorization Time Period  06/09/18 - 07/24/18.  3 visits approved 10/17-10/23    Authorization - Visit Number  2    Authorization - Number of Visits  3    PT Start Time  0901    PT Stop Time  0940    PT Time Calculation (min)  39 min    Equipment Utilized During Treatment  Other (comment)   Rt knee brace   Activity Tolerance  Patient tolerated treatment well    Behavior During Therapy  [facility] for tasks assessed/performed       Past Medical History:  Diagnosis Date  . GERD (gastroesophageal reflux disease)   . History of stomach ulcers   . Hypertension     Past Surgical History:  Procedure Laterality Date  . ANTERIOR CRUCIATE LIGAMENT REPAIR Right 05/19/2018   Procedure: RIGHT KNEE ARTHROSCOPY WITH ANTERIOR CRUCIATE LIGAMENT (ACL) REPAIR and MEDIAL MENISECTOMY;  Surgeon: [name], MD;  Location: [facility];  Service: Orthopedics;  Laterality: Right;  . APPENDECTOMY    . KNEE ARTHROSCOPY WITH MEDIAL MENISECTOMY Right 05/29/2017   Procedure: KNEE ARTHROSCOPY WITH MEDIAL MENISECTOMY and lateral meniscectomy;  Surgeon: [name], MD;  Location: [facility];  Service: Orthopedics;  Laterality: Right;  . KNEE ARTHROSCOPY WITH MEDIAL MENISECTOMY Right 02/05/2018   Procedure: RIGHT KNEE ARTHROSCOPY WITH PARTIAL MEDIAL AND LATERAL MENISECTOMY;  Surgeon: [name], MD;  Location: [facility];  Service: Orthopedics;  Laterality: Right;    There were no vitals filed for  this visit.  Subjective Assessment - 06/17/18 0906    Subjective  Patient reports he is doing well and does not have pain in the morning. He states his pain is typically worst at night. He states he is working on his exercises in the morning and evening. Patient states he is still sleeping with Ace wrap at night but no brace and with a pillow between knees for comfort.     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  No/denies       [facility] Adult PT Treatment/Exercise - 06/17/18 0001      Exercises   Exercises  Knee/Hip      Knee/Hip Exercises: Stretches   Knee: Self-Stretch to increase Flexion  Right;10 seconds;Limitations    Knee: Self-Stretch Limitations  10 reps onto 12"      Knee/Hip Exercises: Aerobic   Stationary Bike  4 minutes for AROM, seat15, level 2      Knee/Hip Exercises: Standing   Heel Raises  Both;1 set;15 reps;3 seconds   on incline   Heel Raises Limitations  toe raises on decline, 1x 15 reps, bil LE    Knee Flexion  Right;1 set;15 reps      Knee/Hip Exercises:  Supine   Knee Extension  AROM    Knee Extension Limitations  1    Knee Flexion  AROM    Knee Flexion Limitations  122      Knee/Hip Exercises: Prone   Hamstring Curl  15 reps;1 set;Limitations    Hamstring Curl Limitations  2 lbs; 1x 15 wiht weight (5 reps without weight)    Hip Extension  Right;15 reps      Manual Therapy   Manual Therapy  Edema management    Manual therapy comments  completed seperate form all other skilled interventions    Edema Management  retro massage Rt knee       PT Education - 06/17/18 0910    Education Details  Educated on exercises throughout session and on importance of continuing to ice every day to manage swelling.     Person(s) Educated  Patient    Methods  Explanation    Comprehension  Verbalized  understanding;Returned demonstration       PT Short Term Goals - 06/11/18 1723      PT SHORT TERM GOAL #1   Title  Patient will be independent with HEP, updated PRN based on stage in protocol, to promote ACL repair healing and stability of Rt knee to return to work activitites and PLOF.    Time  1    Period  Weeks    Status  On-going      PT SHORT TERM GOAL #2   Title  Patient will achieve 0-115 Rt knee ROM up to WNL's or equal to Lt LE for improved mobility with gait and stairs and symmetrical mechanics.     Baseline  ROM 5-115 10/17    Time  3    Period  Weeks    Status  Partially Met      PT SHORT TERM GOAL #3   Title  Patient will improve MMT by 1/2 grade for all limited groups to inidicate signifiacant improvement in functional LE strength.    Time  3    Period  Weeks    Status  On-going      PT SHORT TERM GOAL #4   Title  Patient will have reduced edema of Rt knee and circumference of knee joints will be equal bil LE's to indicate reduced swelling ofr improve ROM, muscle activation, and decresaed pain.     Time  3    Period  Weeks    Status  On-going        PT Long Term Goals - 06/11/18 1725      PT LONG TERM GOAL #1   Title  Patient will improve MMT by 1 grade for all limited groups to inidicate signifiacant improvement in functional LE strength.    Time  6    Period  Weeks    Status  On-going      PT LONG TERM GOAL #2   Title  Patient will achieve full Rt knee ROM up to WNL's or equal to Lt LE for improved mobility with gait and stairs and symmetrical mechanics.     Time  6    Period  Weeks    Status  On-going      PT LONG TERM GOAL #3   Title  Patient will demonstrate safe squat mechanics to progress closed chain exercises within protocol while protecting ACL repair to improve strenght and reudce risk for re-injury.    Time  6    Period  Weeks    Status  On-going          Plan - 06/17/18 0909    Clinical Impression Statement  Continued with Rt LE  strengthening and AROM exercises within phase II precautions for ACL rehab protocol. Patient initiated AROM with stationary bike today and denied pain with this. He continued with standing knee flexion with he reported some discomfrot with. Prone hamstring curl was progressed with 2# weights and he denied pain during this exercises. AROM today is 1-122 degrees (last 5-115). Retrograde massage was continued at EOS for edema management and overall patient's swelling has decreased substantially from evaluation. He will continue to benefit from skilled PT interventions to progress safely thorough rehab protocol.    Rehab Potential  Good    PT Frequency  3x / week    PT Duration  6 weeks    PT Treatment/Interventions  ADLs/Self Care Home Management;Aquatic Therapy;Cryotherapy;Electrical Stimulation;Gait training;Functional mobility Scientist, forensic;Therapeutic activities;Therapeutic exercise;Balance training;Neuromuscular re-education;Patient/family education;Manual techniques;Passive range of motion;Taping;Scar mobilization    PT Next Visit Plan  Follow ACL protocol per Dr. Aline Brochure stage II. Focus on quad set/quad activaiton in 0-10 degrees, do not initiate LAQ or SAQ due to lever arm forces. Initiate bike for AROM     PT Home Exercise Plan  Eval: heel slide, quad set, knee flexion standing.     Consulted and Agree with Plan of Care  Patient       Patient will benefit from skilled therapeutic intervention in order to improve the following deficits and impairments:  Abnormal gait, Decreased skin integrity, Pain, Decreased mobility, Decreased scar mobility, Decreased strength, Decreased range of motion, Decreased endurance, Decreased activity tolerance, Decreased balance, Difficulty walking, Increased edema, Impaired flexibility  Visit Diagnosis: Acute pain of right knee  Muscle weakness (generalized)  Other abnormalities of gait and mobility     Problem List Patient Active Problem List    Diagnosis Date Noted  . S/P right knee arthroscopy 02/05/18 06/23/2017  . S/P ACL repair right 05/19/18   . Chondromalacia of lateral femoral condyle, right   . Chondromalacia, patella, right     Kipp Brood, PT, DPT Physical Therapist with [facility]  06/17/2018 9:46 AM    [place], [place], [address] Phone: [phone]   Fax:  [phone]  Name: Victor [name] MRN: [id number] Date of Birth: [birth year]

## 2018-06-17 NOTE — Patient Instructions (Signed)
Prone Knee Flexion reps: 15-20 sets: 2-3 daily: 1 weekly: 7   Exercise image step 1   Exercise image step 2 Setup  Begin lying on your front on a bed or flat surface. Movement  Slowly bend your surgical leg as far as you can, hold briefly, then return to the starting position and repeat. Tip  Make sure to keep your foot in line with your leg during the exercise. Prone Hip Extension reps: 15-20 sets: 2-3 daily: 1 weekly: 7   Exercise image step 1   Exercise image step 2 Setup  Begin by lying on your stomach with both legs stretched straight behind you.  Movement Slowly lift one leg upward as far as you can without arching your low back, then lower it back to the starting position.  Tip  Make sure to keep your knee straight and trunk steady during the exercise.

## 2018-06-18 ENCOUNTER — Encounter (HOSPITAL_COMMUNITY): Payer: Self-pay

## 2018-06-18 ENCOUNTER — Ambulatory Visit (HOSPITAL_COMMUNITY): Payer: Medicaid Other

## 2018-06-18 DIAGNOSIS — R2689 Other abnormalities of gait and mobility: Secondary | ICD-10-CM

## 2018-06-18 DIAGNOSIS — M25561 Pain in right knee: Secondary | ICD-10-CM

## 2018-06-18 DIAGNOSIS — M6281 Muscle weakness (generalized): Secondary | ICD-10-CM

## 2018-06-18 NOTE — Therapy (Addendum)
[place], [place], [address] Phone: [phone]   Fax:  [phone]  Physical Therapy Treatment  Patient Details  Name: Victor [name] MRN: [id number] Date of Birth: [birth year] Referring Provider (PT): Adonis Huguenin, MD  # OF FEET WALKED: ROM:  Flexion: 132 degrees            Extension: 0 degrees  Encounter Date: 06/18/2018  PT End of Session - 06/18/18 1655    Visit Number  4    Number of Visits  19    Date for PT Re-Evaluation  07/21/18   Minireassess 06/30/18   Authorization Type  Medicaid [place]     Authorization Time Period  06/09/18 - 07/24/18.  3 visits approved 10/17-10/23    Authorization - Visit Number  2   no charge visit, past approval dates   Authorization - Number of Visits  3    PT Start Time  1649    PT Stop Time  1735    PT Time Calculation (min)  46 min    Equipment Utilized During Treatment  --   Rt knee brace   Activity Tolerance  Patient tolerated treatment well    Behavior During Therapy  [facility] for tasks assessed/performed       Past Medical History:  Diagnosis Date  . GERD (gastroesophageal reflux disease)   . History of stomach ulcers   . Hypertension     Past Surgical History:  Procedure Laterality Date  . ANTERIOR CRUCIATE LIGAMENT REPAIR Right 05/19/2018   Procedure: RIGHT KNEE ARTHROSCOPY WITH ANTERIOR CRUCIATE LIGAMENT (ACL) REPAIR and MEDIAL MENISECTOMY;  Surgeon: [name], MD;  Location: [facility];  Service: Orthopedics;  Laterality: Right;  . APPENDECTOMY    . KNEE ARTHROSCOPY WITH MEDIAL MENISECTOMY Right 05/29/2017   Procedure: KNEE ARTHROSCOPY WITH MEDIAL MENISECTOMY and lateral meniscectomy;  Surgeon: [name], MD;  Location: [facility];  Service: Orthopedics;  Laterality: Right;  . KNEE ARTHROSCOPY WITH MEDIAL MENISECTOMY Right 02/05/2018   Procedure: RIGHT KNEE ARTHROSCOPY WITH PARTIAL MEDIAL AND LATERAL MENISECTOMY;  Surgeon: [name], MD;   Location: [facility];  Service: Orthopedics;  Laterality: Right;    There were no vitals filed for this visit.  Subjective Assessment - 06/18/18 1648    Subjective  Pt stated he has minimal pain today, pain scale 2/10.  Reports compliance with HEP.  Stated most difficulty with steps going up and down.      Pertinent History  Rt ACL repair 05/19/18 (2 prior minesectomies)    How long can you sit comfortably?  not limited    How long can you stand comfortably?  About 10 minutes (was 5 minutes it starts aching)    How long can you walk comfortably?  no problem with pain    Patient Stated Goals  Get back to work - 8 hours squatting lifting walking,  (ueven ground) lift up to 80 lbs    Currently in Pain?  Yes    Pain Score  2     Pain Location  Knee    Pain Orientation  Right    Pain Descriptors / Indicators  Sharp    Pain Type  Surgical pain    Pain Onset  1 to 4 weeks ago    Pain Frequency  Intermittent    Aggravating Factors   walking    Pain Relieving Factors  ice, pain medication         [facility]  PT Assessment - 06/18/18 0001      Assessment   Medical Diagnosis  a/p ACL repair    Referring Provider (PT)  Adonis Huguenin, MD    Onset Date/Surgical Date  05/19/18    Hand Dominance  Right    Next MD Visit  07/01/18    Prior Therapy  none      Precautions   Precautions  Knee    Precaution Comments  ACL Repair protocol Aline Brochure)    Required Braces or Orthoses  Other Brace/Splint    Other Brace/Splint  Bledso brace 0-90 on Rt knee      Restrictions   Weight Bearing Restrictions  No    Other Position/Activity Restrictions  WBAT      Observation/Other Assessments-Edema    Edema  Circumferential      Circumferential Edema   Circumferential - Right  16.75" at joint line   was 17.5" at knee joint line   Circumferential - Left   16.5 at joint line   was 16.5" at knee joint line     AROM   AROM Assessment Site  Knee    Right/Left Knee  Right    Right Knee Extension  0   was 7    Right Knee Flexion  132   was 95     Strength   Right Hip Flexion  4+/5   was 4/5   Right Hip Extension  4+/5   was 4+/5   Right Hip ABduction  4+/5   was 4/5   Left Hip Flexion  5/5    Left Hip Extension  5/5    Left Hip ABduction  5/5    Right Knee Flexion  3+/5   was 2+/5   Right Knee Extension  --   06/18/18: not tested was 3+                  [facility] Adult PT Treatment/Exercise - 06/18/18 0001      Exercises   Exercises  Knee/Hip      Knee/Hip Exercises: Standing   Heel Raises  Both;1 set;15 reps;3 seconds    Heel Raises Limitations  toe raises on decline, 1x 15 reps, bil LE    Knee Flexion  Right;1 set;15 reps    Wall Squat  10 reps;3 seconds    Wall Squat Limitations  45 degrees      Knee/Hip Exercises: Seated   Stool Scoot - Round Trips  2RT      Knee/Hip Exercises: Supine   Quad Sets  Right;1 set;10 reps    Quad Sets Limitations  towel under knee    Heel Slides  Right;1 set;10 reps    Straight Leg Raises  Right;15 reps    Straight Leg Raises Limitations  no extension lag    Knee Extension  AROM    Knee Extension Limitations  0   was 7   Knee Flexion  AROM    Knee Flexion Limitations  132   was 95     Knee/Hip Exercises: Prone   Hamstring Curl  15 reps;1 set;Limitations    Hamstring Curl Limitations  3#    Hip Extension  Right;15 reps    Hip Extension Limitations  3#      Manual Therapy   Manual Therapy  Edema management;Taping;Other (comment)    Manual therapy comments  completed seperate form all other skilled interventions    Edema Management  retro massage Rt knee    Other Manual Therapy  Kinesiotaping  for edema control               PT Short Term Goals - 06/18/18 1655      PT SHORT TERM GOAL #1   Title  Patient will be independent with HEP, updated PRN based on stage in protocol, to promote ACL repair healing and stability of Rt knee to return to work activitites and PLOF.    Baseline  06/18/18:  Reports compliance  with HEP daily    Status  Achieved      PT SHORT TERM GOAL #2   Title  Patient will achieve 0-115 Rt knee ROM up to WNL's or equal to Lt LE for improved mobility with gait and stairs and symmetrical mechanics.     Baseline  06/18/18:  AROM 0-132 degrees was 10/17: ROM 5-115 and 0-95 initial eval    Status  Partially Met      PT SHORT TERM GOAL #3   Title  Patient will improve MMT by 1/2 grade for all limited groups to inidicate signifiacant improvement in functional LE strength.    Baseline  10/24: see MMT    Status  On-going      PT SHORT TERM GOAL #4   Title  Patient will have reduced edema of Rt knee and circumference of knee joints will be equal bil LE's to indicate reduced swelling ofr improve ROM, muscle activation, and decresaed pain.     Status  On-going        PT Long Term Goals - 06/11/18 1725      PT LONG TERM GOAL #1   Title  Patient will improve MMT by 1 grade for all limited groups to inidicate signifiacant improvement in functional LE strength.    Time  6    Period  Weeks    Status  On-going      PT LONG TERM GOAL #2   Title  Patient will achieve full Rt knee ROM up to WNL's or equal to Lt LE for improved mobility with gait and stairs and symmetrical mechanics.     Time  6    Period  Weeks    Status  On-going      PT LONG TERM GOAL #3   Title  Patient will demonstrate safe squat mechanics to progress closed chain exercises within protocol while protecting ACL repair to improve strenght and reudce risk for re-injury.    Time  6    Period  Weeks    Status  On-going            Plan - 06/18/18 1745    Clinical Impression Statement  Continued session focus per post-op ACL reconstruction protocol.  Reviewed STGs per 3rd Medicaid visit.  Pt making great gains wiht AROM 0-132 and decreased edema, on .25" from equal to Lt knee joint line.  Stregth is progressing well as well (see MMT).  Progressed strengthening with additional wall squats and stool scoots for  quad and hamstring strenghtening.  EOS with retrograde for edema control.  Added kinesiotaping to assist with edema control.  No reports of pain through session.    Rehab Potential  Good    PT Frequency  3x / week    PT Duration  6 weeks    PT Treatment/Interventions  ADLs/Self Care Home Management;Aquatic Therapy;Cryotherapy;Electrical Stimulation;Gait training;Functional mobility Scientist, forensic;Therapeutic activities;Therapeutic exercise;Balance training;Neuromuscular re-education;Patient/family education;Manual techniques;Passive range of motion;Taping;Scar mobilization    PT Next Visit Plan  Follow ACL protocol per Dr. Aline Brochure stage II.  Focus on quad set/quad activaiton in 0-10 degrees, do not initiate LAQ or SAQ due to lever arm forces. Initiate bike for AROM     PT Home Exercise Plan  Eval: heel slide, quad set, knee flexion standing.        Patient will benefit from skilled therapeutic intervention in order to improve the following deficits and impairments:  Abnormal gait, Decreased skin integrity, Pain, Decreased mobility, Decreased scar mobility, Decreased strength, Decreased range of motion, Decreased endurance, Decreased activity tolerance, Decreased balance, Difficulty walking, Increased edema, Impaired flexibility  Visit Diagnosis: Acute pain of right knee  Muscle weakness (generalized)  Other abnormalities of gait and mobility     Problem List Patient Active Problem List   Diagnosis Date Noted  . S/P right knee arthroscopy 02/05/18 06/23/2017  . S/P ACL repair right 05/19/18   . Chondromalacia of lateral femoral condyle, right   . Chondromalacia, patella, right    Ihor Austin, LPTA; Goff Aldona Lento 06/18/2018, 6:11 PM  [place], [place], [address] Phone: [phone]   Fax:  [phone]  Name: Victor [name] MRN: [id number] Date of Birth: [birth year]

## 2018-06-22 ENCOUNTER — Ambulatory Visit (HOSPITAL_COMMUNITY): Payer: Medicaid Other | Admitting: Physical Therapy

## 2018-06-22 DIAGNOSIS — M25561 Pain in right knee: Secondary | ICD-10-CM

## 2018-06-22 DIAGNOSIS — M6281 Muscle weakness (generalized): Secondary | ICD-10-CM | POA: Diagnosis not present

## 2018-06-22 DIAGNOSIS — R2689 Other abnormalities of gait and mobility: Secondary | ICD-10-CM | POA: Diagnosis not present

## 2018-06-22 NOTE — Therapy (Signed)
[place], [place], [address] Phone: [phone]   Fax:  [phone]  Physical Therapy Treatment  Patient Details  Name: Victor [name] MRN: [id number] Date of Birth: 1975-09-01 Referring Provider (PT): Adonis Huguenin, MD   Encounter Date: 06/22/2018  PT End of Session - 06/22/18 1649    Visit Number  5    Number of Visits  19    Date for PT Re-Evaluation  07/21/18   Minireassess 06/30/18   Authorization Type  Medicaid [place]     Authorization Time Period  06/09/18 - 07/24/18.  3 visits approved 10/17-10/23; 12 visits approved10/28-11/24    Authorization - Visit Number  1   no charge visit, past approval dates   Authorization - Number of Visits  12    PT Start Time  1608    PT Stop Time  1650    PT Time Calculation (min)  42 min    Equipment Utilized During Treatment  --   Rt knee brace   Activity Tolerance  Patient tolerated treatment well    Behavior During Therapy  [facility] for tasks assessed/performed       Past Medical History:  Diagnosis Date  . GERD (gastroesophageal reflux disease)   . History of stomach ulcers   . Hypertension     Past Surgical History:  Procedure Laterality Date  . ANTERIOR CRUCIATE LIGAMENT REPAIR Right 05/19/2018   Procedure: RIGHT KNEE ARTHROSCOPY WITH ANTERIOR CRUCIATE LIGAMENT (ACL) REPAIR and MEDIAL MENISECTOMY;  Surgeon: [name], MD;  Location: [facility];  Service: Orthopedics;  Laterality: Right;  . APPENDECTOMY    . KNEE ARTHROSCOPY WITH MEDIAL MENISECTOMY Right 05/29/2017   Procedure: KNEE ARTHROSCOPY WITH MEDIAL MENISECTOMY and lateral meniscectomy;  Surgeon: [name], MD;  Location: [facility];  Service: Orthopedics;  Laterality: Right;  . KNEE ARTHROSCOPY WITH MEDIAL MENISECTOMY Right 02/05/2018   Procedure: RIGHT KNEE ARTHROSCOPY WITH PARTIAL MEDIAL AND LATERAL MENISECTOMY;  Surgeon: [name], MD;  Location: [facility];  Service: Orthopedics;   Laterality: Right;    There were no vitals filed for this visit.  Subjective Assessment - 06/22/18 1611    Subjective  Pt reports minimal pain with occasional stabbing pains at time    Currently in Pain?  Yes    Pain Score  2     Pain Location  Knee    Pain Orientation  Right                       [facility] Adult PT Treatment/Exercise - 06/22/18 0001      Exercises   Exercises  Knee/Hip      Knee/Hip Exercises: Aerobic   Elliptical  3 minutes LEvel 1    Tread Mill  reverse 1.8 mph 4 minutes      Knee/Hip Exercises: Machines for Strengthening   Cybex Leg Press  4 PL 10 reps       Knee/Hip Exercises: Standing   Heel Raises  Both;1 set;15 reps;3 seconds    Heel Raises Limitations  toe raises on decline, 1x 15 reps, bil LE    Wall Squat  10 reps;5 seconds    Wall Squat Limitations  45 degrees    SLS with Vectors  Rt on foam with 1 HHA 10X5" each    Other Standing Knee Exercises  BAPS Rt LE only with Bil UE's Level 3 all directions 10 reps each      Knee/Hip  Exercises: Seated   Stool Scoot - Round Trips  2RT               PT Short Term Goals - 06/18/18 1655      PT SHORT TERM GOAL #1   Title  Patient will be independent with HEP, updated PRN based on stage in protocol, to promote ACL repair healing and stability of Rt knee to return to work activitites and PLOF.    Baseline  06/18/18:  Reports compliance with HEP daily    Status  Achieved      PT SHORT TERM GOAL #2   Title  Patient will achieve 0-115 Rt knee ROM up to WNL's or equal to Lt LE for improved mobility with gait and stairs and symmetrical mechanics.     Baseline  06/18/18:  AROM 0-132 degrees was 10/17: ROM 5-115 and 0-95 initial eval    Status  Partially Met      PT SHORT TERM GOAL #3   Title  Patient will improve MMT by 1/2 grade for all limited groups to inidicate signifiacant improvement in functional LE strength.    Baseline  10/24: see MMT    Status  On-going      PT SHORT TERM  GOAL #4   Title  Patient will have reduced edema of Rt knee and circumference of knee joints will be equal bil LE's to indicate reduced swelling ofr improve ROM, muscle activation, and decresaed pain.     Status  On-going        PT Long Term Goals - 06/11/18 1725      PT LONG TERM GOAL #1   Title  Patient will improve MMT by 1 grade for all limited groups to inidicate signifiacant improvement in functional LE strength.    Time  6    Period  Weeks    Status  On-going      PT LONG TERM GOAL #2   Title  Patient will achieve full Rt knee ROM up to WNL's or equal to Lt LE for improved mobility with gait and stairs and symmetrical mechanics.     Time  6    Period  Weeks    Status  On-going      PT LONG TERM GOAL #3   Title  Patient will demonstrate safe squat mechanics to progress closed chain exercises within protocol while protecting ACL repair to improve strenght and reudce risk for re-injury.    Time  6    Period  Weeks    Status  On-going            Plan - 06/22/18 1651    Clinical Impression Statement  continued per week 5 post op ACL reconstruction protocol.  Pt without any edema this session so focused more on strengthening/functional activity.  progressed with addition of leg press, retro gait on treadmill and elliptical.  Also added SLS vectors on foam all with good stabiltiy and no pain reported.  Noted mm fatigue at end of session.    Rehab Potential  Good    PT Frequency  3x / week    PT Duration  6 weeks    PT Treatment/Interventions  ADLs/Self Care Home Management;Aquatic Therapy;Cryotherapy;Electrical Stimulation;Gait training;Functional mobility Scientist, forensic;Therapeutic activities;Therapeutic exercise;Balance training;Neuromuscular re-education;Patient/family education;Manual techniques;Passive range of motion;Taping;Scar mobilization    PT Next Visit Plan  Follow ACL protocol per Dr. Aline Brochure stage II. Focus on quad set/quad activaiton in 0-10 degrees,  do not initiate LAQ or SAQ due  to lever arm forces. pt at week 5 beginning weel of 10/28.    PT Home Exercise Plan  Eval: heel slide, quad set, knee flexion standing.        Patient will benefit from skilled therapeutic intervention in order to improve the following deficits and impairments:  Abnormal gait, Decreased skin integrity, Pain, Decreased mobility, Decreased scar mobility, Decreased strength, Decreased range of motion, Decreased endurance, Decreased activity tolerance, Decreased balance, Difficulty walking, Increased edema, Impaired flexibility  Visit Diagnosis: Acute pain of right knee  Muscle weakness (generalized)  Other abnormalities of gait and mobility     Problem List Patient Active Problem List   Diagnosis Date Noted  . S/P right knee arthroscopy 02/05/18 06/23/2017  . S/P ACL repair right 05/19/18   . Chondromalacia of lateral femoral condyle, right   . Chondromalacia, patella, right    Victor [name], PTA/CLT [phone]  Victor [name] 06/22/2018, 4:54 PM  [facility] [address] [place], [place], [address] Phone: [phone]   Fax:  [phone]  Name: Victor [name] MRN: [id number] Date of Birth: 04/30/1976

## 2018-06-24 ENCOUNTER — Ambulatory Visit (HOSPITAL_COMMUNITY): Payer: Medicaid Other

## 2018-06-24 ENCOUNTER — Other Ambulatory Visit: Payer: Self-pay

## 2018-06-24 ENCOUNTER — Encounter (HOSPITAL_COMMUNITY): Payer: Self-pay

## 2018-06-24 DIAGNOSIS — R2689 Other abnormalities of gait and mobility: Secondary | ICD-10-CM

## 2018-06-24 DIAGNOSIS — M6281 Muscle weakness (generalized): Secondary | ICD-10-CM

## 2018-06-24 DIAGNOSIS — M25561 Pain in right knee: Secondary | ICD-10-CM | POA: Diagnosis not present

## 2018-06-24 NOTE — Therapy (Signed)
[place], [place], [address] Phone: [phone]   Fax:  [phone]  Physical Therapy Treatment  Patient Details  Name: Nile Prisk Hennick MRN: [id number] Date of Birth: 1975/12/22 Referring Provider (PT): Adonis Huguenin, MD   Encounter Date: 06/24/2018  PT End of Session - 06/24/18 1529    Visit Number  6    Number of Visits  19    Date for PT Re-Evaluation  07/21/18   Minireassess 06/30/18   Authorization Type  Medicaid [place]     Authorization Time Period  06/09/18 - 07/24/18.  3 visits approved 10/17-10/23; 12 visits approved10/28-11/24    Authorization - Visit Number  2   no charge visit, past approval dates   Authorization - Number of Visits  12    PT Start Time  1522    PT Stop Time  1601    PT Time Calculation (min)  39 min    Equipment Utilized During Treatment  --   Rt knee brace   Activity Tolerance  Patient tolerated treatment well    Behavior During Therapy  [facility] for tasks assessed/performed       Past Medical History:  Diagnosis Date  . GERD (gastroesophageal reflux disease)   . History of stomach ulcers   . Hypertension     Past Surgical History:  Procedure Laterality Date  . ANTERIOR CRUCIATE LIGAMENT REPAIR Right 05/19/2018   Procedure: RIGHT KNEE ARTHROSCOPY WITH ANTERIOR CRUCIATE LIGAMENT (ACL) REPAIR and MEDIAL MENISECTOMY;  Surgeon: [name], MD;  Location: [facility];  Service: Orthopedics;  Laterality: Right;  . APPENDECTOMY    . KNEE ARTHROSCOPY WITH MEDIAL MENISECTOMY Right 05/29/2017   Procedure: KNEE ARTHROSCOPY WITH MEDIAL MENISECTOMY and lateral meniscectomy;  Surgeon: [name], MD;  Location: [facility];  Service: Orthopedics;  Laterality: Right;  . KNEE ARTHROSCOPY WITH MEDIAL MENISECTOMY Right 02/05/2018   Procedure: RIGHT KNEE ARTHROSCOPY WITH PARTIAL MEDIAL AND LATERAL MENISECTOMY;  Surgeon: [name], MD;  Location: [facility];  Service: Orthopedics;   Laterality: Right;    There were no vitals filed for this visit.  Subjective Assessment - 06/24/18 1524    Subjective  Patient reports minimal pain and states it swells occasionall in evening but that has gotten much better. He reports his exercises are going well at home.    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  Yes    Pain Score  3     Pain Location  Knee    Pain Orientation  Right    Pain Descriptors / Indicators  Aching    Pain Type  Surgical pain    Pain Onset  1 to 4 weeks ago    Pain Frequency  Intermittent    Aggravating Factors   walking for prolonged periods    Pain Relieving Factors  ice       [facility] Adult PT Treatment/Exercise - 06/24/18 0001      Exercises   Exercises  Knee/Hip      Knee/Hip Exercises: Aerobic   Elliptical  4 minute on Level 2 for warm up      Knee/Hip Exercises: Machines for Strengthening   Cybex Leg Press  [employer]: 2x 10 reps, 4 plates ([id number])    Other Machine  Hamstring  isometric: reverse treadmill slow down      Knee/Hip Exercises: Standing   Heel Raises  Both;1 set;3 seconds;20 reps   on incline   Heel Raises Limitations  toe raises on decline, 1x 20 reps, bil LE    Wall Squat  2 sets;15 reps    Wall Squat Limitations  45 degrees    Rocker Board  2 minutes;Limitations    Rocker Board Limitations  2x 1 minute (first minute iwht 1 HHA, second without)    SLS with Vectors  Rt LE on foam with 1 HHA 10x 5" 3 way vector    Other Standing Knee Exercises  BAPS Rt LE with Bil UE's Level 3 10x clockwise/10x counterclockwise      Knee/Hip Exercises: Seated   Stool Scoot - Round Trips  Hamstring stool scoot, 2x RT, 30'      Knee/Hip Exercises: Supine   Knee Extension  AROM    Knee Extension Limitations  0    Knee Flexion  AROM    Knee Flexion Limitations  130        PT  Education - 06/24/18 1529    Education Details  Educated on exercises throughout session and on continuing to be cautious with activities at home and to continue wearing brace per MD precautions. Updated HEP.    Person(s) Educated  Patient    Methods  Explanation;Handout    Comprehension  Verbalized understanding;Returned demonstration       PT Short Term Goals - 06/18/18 1655      PT SHORT TERM GOAL #1   Title  Patient will be independent with HEP, updated PRN based on stage in protocol, to promote ACL repair healing and stability of Rt knee to return to work activitites and PLOF.    Baseline  06/18/18:  Reports compliance with HEP daily    Status  Achieved      PT SHORT TERM GOAL #2   Title  Patient will achieve 0-115 Rt knee ROM up to WNL's or equal to Lt LE for improved mobility with gait and stairs and symmetrical mechanics.     Baseline  06/18/18:  AROM 0-132 degrees was 10/17: ROM 5-115 and 0-95 initial eval    Status  Partially Met      PT SHORT TERM GOAL #3   Title  Patient will improve MMT by 1/2 grade for all limited groups to inidicate signifiacant improvement in functional LE strength.    Baseline  10/24: see MMT    Status  On-going      PT SHORT TERM GOAL #4   Title  Patient will have reduced edema of Rt knee and circumference of knee joints will be equal bil LE's to indicate reduced swelling ofr improve ROM, muscle activation, and decresaed pain.     Status  On-going        PT Long Term Goals - 06/11/18 1725      PT LONG TERM GOAL #1   Title  Patient will improve MMT by 1 grade for all limited groups to inidicate signifiacant improvement in functional LE strength.    Time  6    Period  Weeks    Status  On-going      PT LONG TERM GOAL #2   Title  Patient will achieve full Rt knee ROM up to WNL's or equal to Lt LE for improved mobility with gait and stairs and symmetrical mechanics.     Time  6    Period  Weeks    Status  On-going      PT LONG TERM GOAL #3    Title  Patient will demonstrate safe squat mechanics to progress closed chain exercises within protocol while protecting ACL repair to improve strenght and reudce risk for re-injury.    Time  6    Period  Weeks    Status  On-going        Plan - 06/24/18 1530    Clinical Impression Statement  Patient is progressing well and is now ~ 5 weeks post op for Rt ACL reconstruction. He continued with functional strengthening within protocol and performed squats up to 45 degrees on rockerboard and with wall squat. He performed hamstring focused strengthening with stool scoots and isometric exercise with reverse treadmill. He denied pain throughout and continued to demonstrate good Rt knee stability with balance/proprioceptive exercises. SLS with vectors was added to HEP and patient instructed on how to progress difficulty at home. He will continue to benefit from skilled PT interventions to progress safely through rehab protocol to maintain ACL stability and progress towards PLOF.    Rehab Potential  Good    PT Frequency  3x / week    PT Duration  6 weeks    PT Treatment/Interventions  ADLs/Self Care Home Management;Aquatic Therapy;Cryotherapy;Electrical Stimulation;Gait training;Functional mobility Scientist, forensic;Therapeutic activities;Therapeutic exercise;Balance training;Neuromuscular re-education;Patient/family education;Manual techniques;Passive range of motion;Taping;Scar mobilization    PT Next Visit Plan  Follow ACL protocol per Dr. Aline Brochure stage II. Patient 5 weeks post op week of 10/28. Focus on quad set/quad activation in 0-10 degrees, do not initiate LAQ or SAQ due to lever arm forces. Continue with CKC exercise within 0-45 degrees and hamstring based strengthening.    PT Home Exercise Plan  Eval: heel slide, quad set, knee flexion standing.     Consulted and Agree with Plan of Care  Patient       Patient will benefit from skilled therapeutic intervention in order to improve the  following deficits and impairments:  Abnormal gait, Decreased skin integrity, Pain, Decreased mobility, Decreased scar mobility, Decreased strength, Decreased range of motion, Decreased endurance, Decreased activity tolerance, Decreased balance, Difficulty walking, Increased edema, Impaired flexibility  Visit Diagnosis: Acute pain of right knee  Muscle weakness (generalized)  Other abnormalities of gait and mobility     Problem List Patient Active Problem List   Diagnosis Date Noted  . S/P right knee arthroscopy 02/05/18 06/23/2017  . S/P ACL repair right 05/19/18   . Chondromalacia of lateral femoral condyle, right   . Chondromalacia, patella, right     Kipp Brood, PT, DPT Physical Therapist with [facility]  06/24/2018 3:57 PM    [place], [place], [address] Phone: [phone]   Fax:  [phone]  Name: Inioluwa Baris Ates MRN: [id number] Date of Birth: 04-03-1976

## 2018-06-26 ENCOUNTER — Ambulatory Visit (HOSPITAL_COMMUNITY): Payer: Medicaid Other | Attending: Orthopedic Surgery

## 2018-06-26 ENCOUNTER — Encounter (HOSPITAL_COMMUNITY): Payer: Self-pay

## 2018-06-26 DIAGNOSIS — R2689 Other abnormalities of gait and mobility: Secondary | ICD-10-CM | POA: Diagnosis not present

## 2018-06-26 DIAGNOSIS — M25561 Pain in right knee: Secondary | ICD-10-CM | POA: Diagnosis not present

## 2018-06-26 DIAGNOSIS — M6281 Muscle weakness (generalized): Secondary | ICD-10-CM

## 2018-06-26 NOTE — Therapy (Signed)
[facility] [address] [place], [place], [address] Phone: [phone]   Fax:  [phone]  Physical Therapy Treatment  Patient Details  Name: Victor [name] MRN: [id number] Date of Birth: Nov 09, 1975 Referring Provider (PT): Adonis Huguenin, MD   Encounter Date: 06/26/2018  PT End of Session - 06/26/18 1522    Visit Number  7    Number of Visits  19    Date for PT Re-Evaluation  07/21/18   Minireassess 11/05.  MD apt 07/01/18   Authorization Type  Medicaid [place]     Authorization Time Period  06/09/18 - 07/24/18.  3 visits approved 10/17-10/23; 12 visits approved10/28-11/24    Authorization - Visit Number  3    Authorization - Number of Visits  12    PT Start Time  1027   began on elliptical warm upx 1mn, not included iwht charges   PT Stop Time  1600    PT Time Calculation (min)  42 min    Equipment Utilized During Treatment  --   Rt knee brace   Activity Tolerance  Patient tolerated treatment well    Behavior During Therapy  [facility] for tasks assessed/performed       Past Medical History:  Diagnosis Date  . GERD (gastroesophageal reflux disease)   . History of stomach ulcers   . Hypertension     Past Surgical History:  Procedure Laterality Date  . ANTERIOR CRUCIATE LIGAMENT REPAIR Right 05/19/2018   Procedure: RIGHT KNEE ARTHROSCOPY WITH ANTERIOR CRUCIATE LIGAMENT (ACL) REPAIR and MEDIAL MENISECTOMY;  Surgeon: HCarole Civil MD;  Location: [facility];  Service: Orthopedics;  Laterality: Right;  . APPENDECTOMY    . KNEE ARTHROSCOPY WITH MEDIAL MENISECTOMY Right 05/29/2017   Procedure: KNEE ARTHROSCOPY WITH MEDIAL MENISECTOMY and lateral meniscectomy;  Surgeon: HCarole Civil MD;  Location: [facility];  Service: Orthopedics;  Laterality: Right;  . KNEE ARTHROSCOPY WITH MEDIAL MENISECTOMY Right 02/05/2018   Procedure: RIGHT KNEE ARTHROSCOPY WITH PARTIAL MEDIAL AND LATERAL MENISECTOMY;  Surgeon: HCarole Civil MD;  Location:  [facility];  Service: Orthopedics;  Laterality: Right;    There were no vitals filed for this visit.  Subjective Assessment - 06/26/18 1520    Subjective  Pt reports increased pain today, 5-6/10.  Reports increased edema last night, has been compliant with HEP and ice application for edema and pain control.    Patient Stated Goals  Get back to work - 8 hours squatting lifting walking,  (ueven ground) lift up to 80 lbs    Currently in Pain?  Yes    Pain Score  6     Pain Location  Knee    Pain Orientation  Right    Pain Descriptors / Indicators  Aching;Sore;Sharp    Pain Type  Surgical pain    Pain Onset  1 to 4 weeks ago    Pain Frequency  Intermittent   intermittent sharp, constant sore/achey pain   Aggravating Factors   walking for prolonged periods    Pain Relieving Factors  ice         [facility] PT Assessment - 06/26/18 0001      Assessment   Medical Diagnosis  a/p ACL repair    Referring Provider (PT)  HAdonis Huguenin MD    Onset Date/Surgical Date  05/19/18    Hand Dominance  Right    Next MD Visit  07/01/18    Prior Therapy  none      Precautions  Precautions  Knee    Precaution Comments  ACL Repair protocol Aline Brochure)    Required Braces or Orthoses  Other Brace/Splint    Other Brace/Splint  Bledso brace 0-90 on Rt knee      Restrictions   Weight Bearing Restrictions  No    Other Position/Activity Restrictions  WBAT                   [facility] Adult PT Treatment/Exercise - 06/26/18 0001      Knee/Hip Exercises: Aerobic   Elliptical  4 minute on Level 2 for warm up      Knee/Hip Exercises: Machines for Strengthening   Cybex Leg Press  [employer]: 2x 10 reps, 5 plates ([id number])    Other Machine  Hamstring isometric: reverse treadmill slow down      Knee/Hip Exercises: Standing   Heel Raises  Both;1 set;3 seconds;20 reps   incline slope   Heel Raises Limitations  toe raises on decline, 1x 20 reps, bil LE    Wall Squat  2 sets;15 reps    Wall Squat  Limitations  45 degrees    SLS with Vectors  Rt LE on foam with 1 HHA 10x 5" 3 way vector    Other Standing Knee Exercises  BAPS Rt LE with Bil UE's Level 3 10x clockwise/10x counterclockwise      Knee/Hip Exercises: Supine   Straight Leg Raises  Right;2 sets;10 reps    Straight Leg Raises Limitations  no extension lag 2nd set with 5#               PT Short Term Goals - 06/18/18 1655      PT SHORT TERM GOAL #1   Title  Patient will be independent with HEP, updated PRN based on stage in protocol, to promote ACL repair healing and stability of Rt knee to return to work activitites and PLOF.    Baseline  06/18/18:  Reports compliance with HEP daily    Status  Achieved      PT SHORT TERM GOAL #2   Title  Patient will achieve 0-115 Rt knee ROM up to WNL's or equal to Lt LE for improved mobility with gait and stairs and symmetrical mechanics.     Baseline  06/18/18:  AROM 0-132 degrees was 10/17: ROM 5-115 and 0-95 initial eval    Status  Partially Met      PT SHORT TERM GOAL #3   Title  Patient will improve MMT by 1/2 grade for all limited groups to inidicate signifiacant improvement in functional LE strength.    Baseline  10/24: see MMT    Status  On-going      PT SHORT TERM GOAL #4   Title  Patient will have reduced edema of Rt knee and circumference of knee joints will be equal bil LE's to indicate reduced swelling ofr improve ROM, muscle activation, and decresaed pain.     Status  On-going        PT Long Term Goals - 06/11/18 1725      PT LONG TERM GOAL #1   Title  Patient will improve MMT by 1 grade for all limited groups to inidicate signifiacant improvement in functional LE strength.    Time  6    Period  Weeks    Status  On-going      PT LONG TERM GOAL #2   Title  Patient will achieve full Rt knee ROM up to WNL's or equal to Lt  LE for improved mobility with gait and stairs and symmetrical mechanics.     Time  6    Period  Weeks    Status  On-going      PT  LONG TERM GOAL #3   Title  Patient will demonstrate safe squat mechanics to progress closed chain exercises within protocol while protecting ACL repair to improve strenght and reudce risk for re-injury.    Time  6    Period  Weeks    Status  On-going            Plan - 06/26/18 1551    Clinical Impression Statement  Continue session focus with established POC for 5 week post-op Rt ACL reconstruction.  Pt progressing well with AROM (0-130 degrees) goals met and good gait mechanics without AD.  Session focus on primarly quad and isometric hamstrings strengthening.  Pt able to complete SLR with no extension lagging, added weight for quad strengthening, very shaking but able to keep wiht no extension lagging.  No reports of increased pain through session.      Rehab Potential  Good    PT Frequency  3x / week    PT Duration  6 weeks    PT Treatment/Interventions  ADLs/Self Care Home Management;Aquatic Therapy;Cryotherapy;Electrical Stimulation;Gait training;Functional mobility Scientist, forensic;Therapeutic activities;Therapeutic exercise;Balance training;Neuromuscular re-education;Patient/family education;Manual techniques;Passive range of motion;Taping;Scar mobilization    PT Next Visit Plan  Follow ACL protocol per Dr. Aline Brochure stage II, MD apt 07/01/2018. Patient 6 weeks post-op 06/29/18. Add isokinetic 90-40 degree next session.  Focus on quad set/quad activation in 0-10 degrees, do not initiate LAQ or SAQ due to lever arm forces. Continue with CKC exercise within 0-45 degrees and hamstring based strengthening.    PT Home Exercise Plan  Eval: heel slide, quad set, knee flexion standing.        Patient will benefit from skilled therapeutic intervention in order to improve the following deficits and impairments:  Abnormal gait, Decreased skin integrity, Pain, Decreased mobility, Decreased scar mobility, Decreased strength, Decreased range of motion, Decreased endurance, Decreased activity  tolerance, Decreased balance, Difficulty walking, Increased edema, Impaired flexibility  Visit Diagnosis: Acute pain of right knee  Muscle weakness (generalized)  Other abnormalities of gait and mobility     Problem List Patient Active Problem List   Diagnosis Date Noted  . S/P right knee arthroscopy 02/05/18 06/23/2017  . S/P ACL repair right 05/19/18   . Chondromalacia of lateral femoral condyle, right   . Chondromalacia, patella, right    Ihor Austin, LPTA; [place]  Aldona Lento 06/26/2018, 5:16 PM  [facility] [place], [place], [address] Phone: [phone]   Fax:  [phone]  Name: Victor [name] MRN: [id number] Date of Birth: 1975/09/02

## 2018-06-29 ENCOUNTER — Telehealth (HOSPITAL_COMMUNITY): Payer: Self-pay

## 2018-06-29 ENCOUNTER — Ambulatory Visit (HOSPITAL_COMMUNITY): Payer: Medicaid Other

## 2018-06-29 NOTE — Telephone Encounter (Signed)
No-Show #1: I called Mr. Jinkins to check in as he did not arrive for his 9:00AM appointment. He did not pick up so I left a voicemail. I informed him of his next appointment on Wednesday 07/01/18 at 9:00 AM and asked that if he cannot make it to therapy on Wednesday that he call our front office to cancel or reschedule.   Kipp Brood, PT, DPT Physical Therapist with [facility]  06/29/2018 9:22 AM

## 2018-07-01 ENCOUNTER — Encounter (HOSPITAL_COMMUNITY): Payer: Self-pay

## 2018-07-01 ENCOUNTER — Ambulatory Visit (INDEPENDENT_AMBULATORY_CARE_PROVIDER_SITE_OTHER): Payer: Medicaid Other | Admitting: Orthopedic Surgery

## 2018-07-01 ENCOUNTER — Ambulatory Visit (HOSPITAL_COMMUNITY): Payer: Medicaid Other

## 2018-07-01 ENCOUNTER — Encounter: Payer: Self-pay | Admitting: Orthopedic Surgery

## 2018-07-01 ENCOUNTER — Other Ambulatory Visit: Payer: Self-pay

## 2018-07-01 VITALS — BP 142/87 | HR 98 | Ht 71.0 in | Wt 247.0 lb

## 2018-07-01 DIAGNOSIS — M6281 Muscle weakness (generalized): Secondary | ICD-10-CM | POA: Diagnosis not present

## 2018-07-01 DIAGNOSIS — M25561 Pain in right knee: Secondary | ICD-10-CM | POA: Diagnosis not present

## 2018-07-01 DIAGNOSIS — R2689 Other abnormalities of gait and mobility: Secondary | ICD-10-CM

## 2018-07-01 DIAGNOSIS — Z9889 Other specified postprocedural states: Secondary | ICD-10-CM

## 2018-07-01 NOTE — Patient Instructions (Signed)
OOW NOTE 6 WEEKS

## 2018-07-01 NOTE — Progress Notes (Signed)
Chief Complaint  Patient presents with  . Knee Pain    Right knee ACL repair 05/19/18    Encounter Diagnosis  Name Primary?  . S/P ACL repair right 05/19/18 Yes     Current Outpatient Medications:  .  ibuprofen (ADVIL,MOTRIN) 800 MG tablet, Take 1 tablet (800 mg total) by mouth every 8 (eight) hours as needed., Disp: 90 tablet, Rfl: 1 .  promethazine (PHENERGAN) 12.5 MG tablet, Take 1 tablet (12.5 mg total) by mouth every 6 (six) hours as needed for nausea or vomiting. (Patient not taking: Reported on 07/01/2018), Disp: 30 tablet, Rfl: 0  Mr. Mays doing well his knee is straight he is in full extension he flexes 122 degrees he has a stable Lockman test he has no swelling his pain is under control is not taking any opioids  He will continue therapy can remove his brace except for strenuous activity no work for 6 weeks follow-up in 6 weeks

## 2018-07-01 NOTE — Therapy (Signed)
[facility] [address] [place], [place], [address] Phone: [phone]   Fax:  [phone]  Physical Therapy Treatment  Patient Details  Name: Victor [name] MRN: [id number] Date of Birth: 1976/04/13 Referring Provider (PT): Adonis Huguenin, MD   Encounter Date: 07/01/2018  PT End of Session - 07/01/18 0905    Visit Number  8    Number of Visits  19    Date for PT Re-Evaluation  07/21/18   Minireassess 11/05.  MD apt 07/01/18   Authorization Type  Medicaid [place]     Authorization Time Period  06/09/18 - 07/24/18.  3 visits approved 10/17-10/23; 12 visits approved10/28-11/24    Authorization - Visit Number  4    Authorization - Number of Visits  12    PT Start Time  0901    PT Stop Time  0940    PT Time Calculation (min)  39 min    Equipment Utilized During Treatment  --   Rt knee brace   Activity Tolerance  Patient tolerated treatment well    Behavior During Therapy  WFL for tasks assessed/performed       Past Medical History:  Diagnosis Date  . GERD (gastroesophageal reflux disease)   . History of stomach ulcers   . Hypertension     Past Surgical History:  Procedure Laterality Date  . ANTERIOR CRUCIATE LIGAMENT REPAIR Right 05/19/2018   Procedure: RIGHT KNEE ARTHROSCOPY WITH ANTERIOR CRUCIATE LIGAMENT (ACL) REPAIR and MEDIAL MENISECTOMY;  Surgeon: [name], MD;  Location: [facility];  Service: Orthopedics;  Laterality: Right;  . APPENDECTOMY    . KNEE ARTHROSCOPY WITH MEDIAL MENISECTOMY Right 05/29/2017   Procedure: KNEE ARTHROSCOPY WITH MEDIAL MENISECTOMY and lateral meniscectomy;  Surgeon: [name], MD;  Location: [facility];  Service: Orthopedics;  Laterality: Right;  . KNEE ARTHROSCOPY WITH MEDIAL MENISECTOMY Right 02/05/2018   Procedure: RIGHT KNEE ARTHROSCOPY WITH PARTIAL MEDIAL AND LATERAL MENISECTOMY;  Surgeon: [name], MD;  Location: [facility];  Service: Orthopedics;  Laterality: Right;    There  were no vitals filed for this visit.  Subjective Assessment - 07/01/18 0904    Subjective  Patietn reports he forgot about Monday's appointment and states it won't happen again. He has his follow up with dr. Aline Brochure today for 6 weeks post-op. He states his knee is hurting a little bit but nothing severe, feels like a dull ache.     Pertinent History  Rt ACL repair 05/19/18 (2 prior minesectomies)    How long can you sit comfortably?  not limited    Patient Stated Goals  Get back to work - 8 hours squatting lifting walking,  (ueven ground) lift up to 80 lbs    Currently in Pain?  Yes    Pain Score  2     Pain Location  Knee    Pain Orientation  Right    Pain Descriptors / Indicators  Aching;Dull    Pain Type  Surgical pain    Pain Onset  More than a month ago    Pain Frequency  Intermittent    Aggravating Factors   prolonged walking    Pain Relieving Factors  ice        [facility] Adult PT Treatment/Exercise - 07/01/18 0001      Exercises   Exercises  Knee/Hip      Knee/Hip Exercises: Aerobic   Elliptical  4 minute on Level 2 for warm up  Knee/Hip Exercises: Machines for Strengthening   Cybex Leg Press  [employer]: 2x 15 reps, 5 plates (11HER)    Other Machine  Biodex: isokinetic ROM at 180,150,120 degrees/sec; 2x 10 reps flex/ext at each rate      Knee/Hip Exercises: Standing   Forward Step Up  Right;2 sets;15 reps;Hand Hold: 0;Step Height: 6"    Wall Squat  2 sets;15 reps    Wall Squat Limitations  45 degrees    SLS with Vectors  Rt LE, solid surface, 10x 3 way vector, 5" holds    Other Standing Knee Exercises  BAPS Rt LE with Bil UE's Level 3 10x clockwise/10x counterclockwise      Knee/Hip Exercises: Seated   Stool Scoot - Round Trips  Hamstring stool scoot, 2x RT, 30'      Knee/Hip Exercises: Supine   Knee Extension  AROM    Knee Extension Limitations  0    Knee Flexion  AROM    Knee Flexion Limitations  123         PT Education - 07/01/18 0936    Education  Details  Educated on current ROM and on exercises throughout. Educated on Biodex with isokinetic ROM strengthening.     Person(s) Educated  Patient    Methods  Explanation    Comprehension  Verbalized understanding;Returned demonstration        PT Short Term Goals - 06/18/18 1655      PT SHORT TERM GOAL #1   Title  Patient will be independent with HEP, updated PRN based on stage in protocol, to promote ACL repair healing and stability of Rt knee to return to work activitites and PLOF.    Baseline  06/18/18:  Reports compliance with HEP daily    Status  Achieved      PT SHORT TERM GOAL #2   Title  Patient will achieve 0-115 Rt knee ROM up to WNL's or equal to Lt LE for improved mobility with gait and stairs and symmetrical mechanics.     Baseline  06/18/18:  AROM 0-132 degrees was 10/17: ROM 5-115 and 0-95 initial eval    Status  Partially Met      PT SHORT TERM GOAL #3   Title  Patient will improve MMT by 1/2 grade for all limited groups to inidicate signifiacant improvement in functional LE strength.    Baseline  10/24: see MMT    Status  On-going      PT SHORT TERM GOAL #4   Title  Patient will have reduced edema of Rt knee and circumference of knee joints will be equal bil LE's to indicate reduced swelling ofr improve ROM, muscle activation, and decresaed pain.     Status  On-going        PT Long Term Goals - 06/11/18 1725      PT LONG TERM GOAL #1   Title  Patient will improve MMT by 1 grade for all limited groups to inidicate signifiacant improvement in functional LE strength.    Time  6    Period  Weeks    Status  On-going      PT LONG TERM GOAL #2   Title  Patient will achieve full Rt knee ROM up to WNL's or equal to Lt LE for improved mobility with gait and stairs and symmetrical mechanics.     Time  6    Period  Weeks    Status  On-going      PT LONG TERM GOAL #3  Title  Patient will demonstrate safe squat mechanics to progress closed chain exercises within  protocol while protecting ACL repair to improve strenght and reudce risk for re-injury.    Time  6    Period  Weeks    Status  On-going        Plan - 07/01/18 0930    Clinical Impression Statement  Patient is not 6 weeks post-op in Rt ACL protocol. Initiated isokinetic strengthening at high speed of 120-180 degrees per second today. Patient denied pain or discomfort with exercises. He continues to demonstrate good form with wall squat and SLS challenge on Rt LE with no lateral translation of knee. He continued with BAPS and had difficulty with counterclockwise rotation compared to other directions of movement. He initiated step up training today as well to progress CKC strengthening. He will continue to benefit from skilled PT interventions to progress through ACL repair protocol safely for return to PLOF with decreased risk of injury.     Rehab Potential  Good    PT Frequency  3x / week    PT Duration  6 weeks    PT Treatment/Interventions  ADLs/Self Care Home Management;Aquatic Therapy;Cryotherapy;Electrical Stimulation;Gait training;Functional mobility Scientist, forensic;Therapeutic activities;Therapeutic exercise;Balance training;Neuromuscular re-education;Patient/family education;Manual techniques;Passive range of motion;Taping;Scar mobilization    PT Next Visit Plan  Follow ACL protocol per Dr. Aline Brochure stage II, MD apt 07/01/2018. Patient 6 weeks post-op 06/29/18. Continue isokinetic next session.  Focus on CKC exercise within progressing throughout full ROM. Progress hamstring based strengthening as able.    PT Home Exercise Plan  Eval: heel slide, quad set, knee flexion standing.     Consulted and Agree with Plan of Care  Patient       Patient will benefit from skilled therapeutic intervention in order to improve the following deficits and impairments:  Abnormal gait, Decreased skin integrity, Pain, Decreased mobility, Decreased scar mobility, Decreased strength, Decreased range of  motion, Decreased endurance, Decreased activity tolerance, Decreased balance, Difficulty walking, Increased edema, Impaired flexibility  Visit Diagnosis: Acute pain of right knee  Muscle weakness (generalized)  Other abnormalities of gait and mobility     Problem List Patient Active Problem List   Diagnosis Date Noted  . S/P right knee arthroscopy 02/05/18 06/23/2017  . S/P ACL repair right 05/19/18   . Chondromalacia of lateral femoral condyle, right   . Chondromalacia, patella, right     Kipp Brood, PT, DPT Physical Therapist with [facility]  07/01/2018 9:45 AM    [place], [place], [address] Phone: [phone]   Fax:  [phone]  Name: Victor [name] MRN: [id number] Date of Birth: 11/19/1975

## 2018-07-03 ENCOUNTER — Encounter (HOSPITAL_COMMUNITY): Payer: Self-pay

## 2018-07-03 ENCOUNTER — Ambulatory Visit (HOSPITAL_COMMUNITY): Payer: Medicaid Other

## 2018-07-03 DIAGNOSIS — M6281 Muscle weakness (generalized): Secondary | ICD-10-CM

## 2018-07-03 DIAGNOSIS — R2689 Other abnormalities of gait and mobility: Secondary | ICD-10-CM | POA: Diagnosis not present

## 2018-07-03 DIAGNOSIS — M25561 Pain in right knee: Secondary | ICD-10-CM

## 2018-07-03 NOTE — Therapy (Signed)
[facility] [place], [place], [address] Phone: [phone]   Fax:  [phone]  Physical Therapy Treatment  Patient Details  Name: Victor [name] MRN: [id number] Date of Birth: 02/09/1976 Referring Provider (PT): Adonis Huguenin, MD   Encounter Date: 07/03/2018  PT End of Session - 07/03/18 1432    Visit Number  9    Number of Visits  19    Date for PT Re-Evaluation  07/21/18   Minireassess 06/30/18   Authorization Type  Medicaid [place]     Authorization Time Period  06/09/18 - 07/24/18.  3 visits approved 10/17-10/23; 12 visits approved10/28-11/24    Authorization - Visit Number  5    Authorization - Number of Visits  12    PT Start Time  1430   began on elliptical 4', not included with charges   PT Stop Time  1515    PT Time Calculation (min)  45 min    Activity Tolerance  Patient tolerated treatment well    Behavior During Therapy  [facility] for tasks assessed/performed       Past Medical History:  Diagnosis Date  . GERD (gastroesophageal reflux disease)   . History of stomach ulcers   . Hypertension     Past Surgical History:  Procedure Laterality Date  . ANTERIOR CRUCIATE LIGAMENT REPAIR Right 05/19/2018   Procedure: RIGHT KNEE ARTHROSCOPY WITH ANTERIOR CRUCIATE LIGAMENT (ACL) REPAIR and MEDIAL MENISECTOMY;  Surgeon: [name], MD;  Location: [facility];  Service: Orthopedics;  Laterality: Right;  . APPENDECTOMY    . KNEE ARTHROSCOPY WITH MEDIAL MENISECTOMY Right 05/29/2017   Procedure: KNEE ARTHROSCOPY WITH MEDIAL MENISECTOMY and lateral meniscectomy;  Surgeon: [name], MD;  Location: [facility];  Service: Orthopedics;  Laterality: Right;  . KNEE ARTHROSCOPY WITH MEDIAL MENISECTOMY Right 02/05/2018   Procedure: RIGHT KNEE ARTHROSCOPY WITH PARTIAL MEDIAL AND LATERAL MENISECTOMY;  Surgeon: [name], MD;  Location: [facility];  Service: Orthopedics;  Laterality: Right;    There were no vitals filed  for this visit.  Subjective Assessment - 07/03/18 1431    Subjective  Pt arrived without brace, instructed by MD to wear out in uneven ground.  Reports MD happy wiht progress, to continue therapy and return in 6 weeks.  Current pain scale 1-2/10 mainly soreness.      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  Yes    Pain Score  1     Pain Location  Knee    Pain Orientation  Right    Pain Descriptors / Indicators  Sore    Pain Type  Surgical pain    Pain Onset  More than a month ago    Pain Frequency  Intermittent    Aggravating Factors   prolonged walking    Pain Relieving Factors  ice                       [facility] Adult PT Treatment/Exercise - 07/03/18 0001      Exercises   Exercises  Knee/Hip      Knee/Hip Exercises: Aerobic   Elliptical  4 minute on Level 2 for warm up      Knee/Hip Exercises: Machines for Strengthening   Cybex Leg Press  [employer]:  2x 15 reps, 5 plates (76BHA) then 6Pl (60#)    Other Machine  Biodex: isokinetic ROM at 180,150,120 degrees/sec; 2x 10 reps flex/ext at each rate      Knee/Hip Exercises: Standing   Forward Step Up  Right;15 reps;Hand Hold: 0;Step Height: 6"    Step Down  Right;10 reps;Hand Hold: 0;Step Height: 4";Step Height: 6"   able to demonstrate good eccentric control with 4 and 6in   Functional Squat  10 reps    Functional Squat Limitations  minisquat front of mat cueing for mechanics    Wall Squat  15 reps;5 seconds    Wall Squat Limitations  45 degrees    SLS with Vectors  Rt LE, solid surface, 10x 3 way vector, 5" holds    Other Standing Knee Exercises  BAPS Rt LE with Bil UE's Level 3 10x clockwise/10x counterclockwise      Knee/Hip Exercises: Supine   Straight Leg Raises  Right;2 sets;10 reps    Straight Leg Raises Limitations  no extension lag 2nd set with 5#     Knee Extension  AROM    Knee Extension Limitations  0    Knee Flexion  AROM    Knee Flexion Limitations  123      Knee/Hip Exercises: Prone   Hamstring Curl  15 reps;1 set;Limitations    Hamstring Curl Limitations  7#    Hip Extension  Right;15 reps    Hip Extension Limitations  5#               PT Short Term Goals - 06/18/18 1655      PT SHORT TERM GOAL #1   Title  Patient will be independent with HEP, updated PRN based on stage in protocol, to promote ACL repair healing and stability of Rt knee to return to work activitites and PLOF.    Baseline  06/18/18:  Reports compliance with HEP daily    Status  Achieved      PT SHORT TERM GOAL #2   Title  Patient will achieve 0-115 Rt knee ROM up to WNL's or equal to Lt LE for improved mobility with gait and stairs and symmetrical mechanics.     Baseline  06/18/18:  AROM 0-132 degrees was 10/17: ROM 5-115 and 0-95 initial eval    Status  Partially Met      PT SHORT TERM GOAL #3   Title  Patient will improve MMT by 1/2 grade for all limited groups to inidicate signifiacant improvement in functional LE strength.    Baseline  10/24: see MMT    Status  On-going      PT SHORT TERM GOAL #4   Title  Patient will have reduced edema of Rt knee and circumference of knee joints will be equal bil LE's to indicate reduced swelling ofr improve ROM, muscle activation, and decresaed pain.     Status  On-going        PT Long Term Goals - 06/11/18 1725      PT LONG TERM GOAL #1   Title  Patient will improve MMT by 1 grade for all limited groups to inidicate signifiacant improvement in functional LE strength.    Time  6    Period  Weeks    Status  On-going      PT LONG TERM GOAL #2   Title  Patient will achieve full Rt knee ROM up to WNL's or equal to Lt LE for improved mobility with gait and stairs and  symmetrical mechanics.     Time  6    Period  Weeks    Status  On-going      PT LONG TERM GOAL #3   Title  Patient will  demonstrate safe squat mechanics to progress closed chain exercises within protocol while protecting ACL repair to improve strenght and reudce risk for re-injury.    Time  6    Period  Weeks    Status  On-going            Plan - 07/03/18 1523    Clinical Impression Statement  Continued current POC for 6 week post-op per Rt ACL protocol.  Pt arrived without brace on knee per MD, unless on uneven grounds.  Continued wiht isokinetic strengthening on Biodex at high speed 120-180 degrees per second.  Added minisquats, resistance with stool scoot and increased weight wiht hamstring curls for hamstring and gluteal strengthening as well as began step down training.  Pt able to demonstrate good mechanics with all exericses minus cueing required for proper mechanics wiht minisquats.  No reports of pain thorugh session, noted visible musculature fatigue especially quadriceps at EOS.      Rehab Potential  Good    PT Frequency  3x / week    PT Duration  6 weeks    PT Treatment/Interventions  ADLs/Self Care Home Management;Aquatic Therapy;Cryotherapy;Electrical Stimulation;Gait training;Functional mobility Scientist, forensic;Therapeutic activities;Therapeutic exercise;Balance training;Neuromuscular re-education;Patient/family education;Manual techniques;Passive range of motion;Taping;Scar mobilization    PT Next Visit Plan  Follow ACL protocol per Dr. Aline Brochure stage II, MD apt 07/01/2018. Patient 6 weeks post-op 06/29/18. Continue isokinetic next session.  Focus on CKC exercise within progressing throughout full ROM. Progress hamstring based strengthening as able.    PT Home Exercise Plan  Eval: heel slide, quad set, knee flexion standing.        Patient will benefit from skilled therapeutic intervention in order to improve the following deficits and impairments:  Abnormal gait, Decreased skin integrity, Pain, Decreased mobility, Decreased scar mobility, Decreased strength, Decreased range of motion,  Decreased endurance, Decreased activity tolerance, Decreased balance, Difficulty walking, Increased edema, Impaired flexibility  Visit Diagnosis: Acute pain of right knee  Muscle weakness (generalized)  Other abnormalities of gait and mobility     Problem List Patient Active Problem List   Diagnosis Date Noted  . S/P right knee arthroscopy 02/05/18 06/23/2017  . S/P ACL repair right 05/19/18   . Chondromalacia of lateral femoral condyle, right   . Chondromalacia, patella, right    Ihor Austin, LPTA; Sylvia  Aldona Lento 07/03/2018, 4:11 PM  [facility] [place], [place], [address] Phone: [phone]   Fax:  [phone]  Name: Victor [name] MRN: [id number] Date of Birth: August 24, 1976

## 2018-07-06 ENCOUNTER — Ambulatory Visit (HOSPITAL_COMMUNITY): Payer: Medicaid Other

## 2018-07-06 ENCOUNTER — Encounter (HOSPITAL_COMMUNITY): Payer: Self-pay

## 2018-07-06 ENCOUNTER — Other Ambulatory Visit: Payer: Self-pay

## 2018-07-06 DIAGNOSIS — R2689 Other abnormalities of gait and mobility: Secondary | ICD-10-CM

## 2018-07-06 DIAGNOSIS — M25561 Pain in right knee: Secondary | ICD-10-CM | POA: Diagnosis not present

## 2018-07-06 DIAGNOSIS — M6281 Muscle weakness (generalized): Secondary | ICD-10-CM | POA: Diagnosis not present

## 2018-07-06 NOTE — Therapy (Signed)
[place], [place], [address] Phone: [phone]   Fax:  [phone]  Physical Therapy Treatment  Patient Details  Name: Victor [name] MRN: [id number] Date of Birth: May 12, 1976 Referring Provider (PT): Adonis Huguenin, MD   Encounter Date: 07/06/2018  PT End of Session - 07/06/18 1659    Visit Number  10    Number of Visits  19    Date for PT Re-Evaluation  07/21/18   Minireassess 06/30/18   Authorization Type  Medicaid [place]     Authorization Time Period  06/09/18 - 07/24/18.  3 visits approved 10/17-10/23; 12 visits approved10/28-11/24    Authorization - Visit Number  6    Authorization - Number of Visits  12    PT Start Time  1937    PT Stop Time  1728    PT Time Calculation (min)  40 min    Activity Tolerance  Patient tolerated treatment well    Behavior During Therapy  [facility] for tasks assessed/performed       Past Medical History:  Diagnosis Date  . GERD (gastroesophageal reflux disease)   . History of stomach ulcers   . Hypertension     Past Surgical History:  Procedure Laterality Date  . ANTERIOR CRUCIATE LIGAMENT REPAIR Right 05/19/2018   Procedure: RIGHT KNEE ARTHROSCOPY WITH ANTERIOR CRUCIATE LIGAMENT (ACL) REPAIR and MEDIAL MENISECTOMY;  Surgeon: [name], MD;  Location: [facility];  Service: Orthopedics;  Laterality: Right;  . APPENDECTOMY    . KNEE ARTHROSCOPY WITH MEDIAL MENISECTOMY Right 05/29/2017   Procedure: KNEE ARTHROSCOPY WITH MEDIAL MENISECTOMY and lateral meniscectomy;  Surgeon: [name], MD;  Location: [facility];  Service: Orthopedics;  Laterality: Right;  . KNEE ARTHROSCOPY WITH MEDIAL MENISECTOMY Right 02/05/2018   Procedure: RIGHT KNEE ARTHROSCOPY WITH PARTIAL MEDIAL AND LATERAL MENISECTOMY;  Surgeon: [name], MD;  Location: [facility];  Service: Orthopedics;  Laterality: Right;    There were no vitals filed for this visit.  Subjective Assessment - 07/06/18  1650    Subjective  Patient arrived without brace and reports he was instructed wear it if he goes outsid eon uneven ground. He reports soem pain today at 2/10 and states he is still icing after doing a lot of walking or activity.    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  Yes    Pain Score  2     Pain Location  Knee    Pain Orientation  Right    Pain Descriptors / Indicators  Sore    Pain Type  Surgical pain    Pain Onset  More than a month ago    Pain Frequency  Intermittent    Aggravating Factors   prolonged walking    Pain Relieving Factors  ice        [facility] Adult PT Treatment/Exercise - 07/06/18 0001    Knee/Hip Exercises: Aerobic  Elliptical 4 minute on Level 2 for warm up  Tread Mill reverse 1.8 mph 3x1 minute  Knee/Hip Exercises: Machines for Strengthening  Other Machine Biodex: isokinetic ROM at 902,409,735 degrees/sec; 2x 10 reps flex/ext at each rate  Knee/Hip Exercises: Standing  Forward Step Up Right;15 reps;Hand Hold: 0;Step Height: 6"  Step Down Right;10 reps;Hand Hold: 0;Step Height: 2"  Functional Squat 10 reps  Functional Squat Limitations chair taps for squat  SLS with Vectors Backwards step up: Right;10 reps;Hand Hold: 0;Step Height: 6"  Other Standing Knee Exercises BAPS Rt LE with Bil UE's Level 3 15x clockwise/15x counterclockwise; 15 x ant/post, 15x lateral  Knee/Hip Exercises: Prone  Hamstring Curl 15 reps;1 set;Limitations  Hamstring Curl Limitations 5 lbs  Hip Extension Right;15 reps  Hip Extension Limitations 5 lbs     PT Education - 07/06/18 1723    Education Details  Educated on overall rehab timeline for ACL reconstruction and on coper versus non-coper related to [employer] integrity. Discussed likelihood of total knee replacement in the future and strategies to help prevent this or deter it.      Person(s) Educated  Patient    Methods  Explanation    Comprehension  Verbalized understanding;Returned demonstration       PT Short Term Goals - 06/18/18 1655      PT SHORT TERM GOAL #1   Title  Patient will be independent with HEP, updated PRN based on stage in protocol, to promote ACL repair healing and stability of Rt knee to return to work activitites and PLOF.    Baseline  06/18/18:  Reports compliance with HEP daily    Status  Achieved      PT SHORT TERM GOAL #2   Title  Patient will achieve 0-115 Rt knee ROM up to WNL's or equal to Lt LE for improved mobility with gait and stairs and symmetrical mechanics.     Baseline  06/18/18:  AROM 0-132 degrees was 10/17: ROM 5-115 and 0-95 initial eval    Status  Partially Met      PT SHORT TERM GOAL #3   Title  Patient will improve MMT by 1/2 grade for all limited groups to inidicate signifiacant improvement in functional LE strength.    Baseline  10/24: see MMT    Status  On-going      PT SHORT TERM GOAL #4   Title  Patient will have reduced edema of Rt knee and circumference of knee joints will be equal bil LE's to indicate reduced swelling ofr improve ROM, muscle activation, and decresaed pain.     Status  On-going        PT Long Term Goals - 06/11/18 1725      PT LONG TERM GOAL #1   Title  Patient will improve MMT by 1 grade for all limited groups to inidicate signifiacant improvement in functional LE strength.    Time  6    Period  Weeks    Status  On-going      PT LONG TERM GOAL #2   Title  Patient will achieve full Rt knee ROM up to WNL's or equal to Lt LE for improved mobility with gait and stairs and symmetrical mechanics.     Time  6    Period  Weeks    Status  On-going      PT LONG TERM GOAL #3   Title  Patient will demonstrate safe squat mechanics to progress closed chain exercises within protocol while protecting ACL repair to improve strenght and reudce risk for re-injury.    Time  6    Period  Weeks     Status  On-going        Plan - 07/06/18 1659    Clinical Impression Statement  This session continued rehab within protocol from 6-8 weeks. Patient is 7 weeks post op. He  continued with biodex at higher speeds for isokinetic strengthening and denied pain with exercise.  He also progressed CKC strengthening for Rt LE with step down on 2" step for eccentric quad control. Forward and reverse step ups were also continued and hamstring focused exercises. He denied any increase in pain at EOS and demonstrated good knee stability with exercises. He will continue to benefit from skilled PT interventions to address impairments and progress safely through rehab protocol.    Rehab Potential  Good    PT Frequency  3x / week    PT Duration  6 weeks    PT Treatment/Interventions  ADLs/Self Care Home Management;Aquatic Therapy;Cryotherapy;Electrical Stimulation;Gait training;Functional mobility Scientist, forensic;Therapeutic activities;Therapeutic exercise;Balance training;Neuromuscular re-education;Patient/family education;Manual techniques;Passive range of motion;Taping;Scar mobilization    PT Next Visit Plan  Follow ACL protocol per Dr. Aline Brochure, patient 7 weeks post-op 07/06/18. Continue isokinetic next session. Focus on CKC exercise within progressing throughout full ROM. Progress hamstring based strengthening as able. Add lunges week of 07/13/18.    PT Home Exercise Plan  Eval: heel slide, quad set, knee flexion standing.     Consulted and Agree with Plan of Care  Patient       Patient will benefit from skilled therapeutic intervention in order to improve the following deficits and impairments:  Abnormal gait, Decreased skin integrity, Pain, Decreased mobility, Decreased scar mobility, Decreased strength, Decreased range of motion, Decreased endurance, Decreased activity tolerance, Decreased balance, Difficulty walking, Increased edema, Impaired flexibility  Visit Diagnosis: Acute pain of right  knee  Muscle weakness (generalized)  Other abnormalities of gait and mobility     Problem List Patient Active Problem List   Diagnosis Date Noted  . S/P right knee arthroscopy 02/05/18 06/23/2017  . S/P ACL repair right 05/19/18   . Chondromalacia of lateral femoral condyle, right   . Chondromalacia, patella, right     Kipp Brood, PT, DPT Physical Therapist with [facility]  07/07/2018 3:06 PM    [place], [place], [address] Phone: [phone]   Fax:  [phone]  Name: Haakon Titsworth Bodley MRN: [id number] Date of Birth: 1975/12/28

## 2018-07-08 ENCOUNTER — Ambulatory Visit (HOSPITAL_COMMUNITY): Payer: Medicaid Other

## 2018-07-09 ENCOUNTER — Ambulatory Visit (HOSPITAL_COMMUNITY): Payer: Medicaid Other | Admitting: Physical Therapy

## 2018-07-09 DIAGNOSIS — M6281 Muscle weakness (generalized): Secondary | ICD-10-CM | POA: Diagnosis not present

## 2018-07-09 DIAGNOSIS — R2689 Other abnormalities of gait and mobility: Secondary | ICD-10-CM

## 2018-07-09 DIAGNOSIS — M25561 Pain in right knee: Secondary | ICD-10-CM | POA: Diagnosis not present

## 2018-07-09 NOTE — Therapy (Signed)
[place], [place], [address] Phone: [phone]   Fax:  [phone]  Physical Therapy Treatment  Patient Details  Name: Victor [name] MRN: [id number] Date of Birth: 1976/02/22 Referring Provider (PT): Adonis Huguenin, MD   Encounter Date: 07/09/2018  PT End of Session - 07/09/18 1809    Visit Number  11    Number of Visits  19    Date for PT Re-Evaluation  07/21/18   Minireassess 06/30/18   Authorization Type  Medicaid [place]     Authorization Time Period  06/09/18 - 07/24/18.  3 visits approved 10/17-10/23; 12 visits approved10/28-11/24    Authorization - Visit Number  7    Authorization - Number of Visits  12    PT Start Time  0626   treatment started by Geraldine Solar, PT   PT Stop Time  1600    PT Time Calculation (min)  39 min    Activity Tolerance  Patient tolerated treatment well    Behavior During Therapy  [facility] for tasks assessed/performed       Past Medical History:  Diagnosis Date  . GERD (gastroesophageal reflux disease)   . History of stomach ulcers   . Hypertension     Past Surgical History:  Procedure Laterality Date  . ANTERIOR CRUCIATE LIGAMENT REPAIR Right 05/19/2018   Procedure: RIGHT KNEE ARTHROSCOPY WITH ANTERIOR CRUCIATE LIGAMENT (ACL) REPAIR and MEDIAL MENISECTOMY;  Surgeon: [name], MD;  Location: [facility];  Service: Orthopedics;  Laterality: Right;  . APPENDECTOMY    . KNEE ARTHROSCOPY WITH MEDIAL MENISECTOMY Right 05/29/2017   Procedure: KNEE ARTHROSCOPY WITH MEDIAL MENISECTOMY and lateral meniscectomy;  Surgeon: [name], MD;  Location: [facility];  Service: Orthopedics;  Laterality: Right;  . KNEE ARTHROSCOPY WITH MEDIAL MENISECTOMY Right 02/05/2018   Procedure: RIGHT KNEE ARTHROSCOPY WITH PARTIAL MEDIAL AND LATERAL MENISECTOMY;  Surgeon: [name], MD;  Location: [facility];  Service: Orthopedics;  Laterality: Right;    There were no vitals filed for this  visit.  Subjective Assessment - 07/09/18 1814    Subjective  Pt states he is doing well today with only minimal discomfort in knee, 1-2/10.     Currently in Pain?  Yes    Pain Score  2     Pain Location  Knee    Pain Orientation  Left    Pain Descriptors / Indicators  Sore    Pain Type  Surgical pain                       [facility] Adult PT Treatment/Exercise - 07/09/18 0001      Exercises   Exercises  Knee/Hip      Knee/Hip Exercises: Aerobic   Elliptical  4 minute on Level 2 for warm up    Tread Mill  reverse 1.8 mph 3x1 minute      Knee/Hip Exercises: Machines for Strengthening   Cybex Knee Flexion  rt only 3PL 10X2 reps    Other Machine  Biodex: isokinetic ROM at 150,120,90 degrees/sec; 2x 10 reps flex/ext at each rate      Knee/Hip Exercises: Standing   Forward Step Up  Right;15 reps;Hand Hold: 0;Step Height: 6"    Step Down  Right;Step Height: 4";10 reps;Hand Hold: 1    Functional Squat  15 reps    Wall Squat Limitations  wall sitting, increase time next session    SLS with Vectors  Rt only  with 1 HHA 5X10"                PT Short Term Goals - 06/18/18 1655      PT SHORT TERM GOAL #1   Title  Patient will be independent with HEP, updated PRN based on stage in protocol, to promote ACL repair healing and stability of Rt knee to return to work activitites and PLOF.    Baseline  06/18/18:  Reports compliance with HEP daily    Status  Achieved      PT SHORT TERM GOAL #2   Title  Patient will achieve 0-115 Rt knee ROM up to WNL's or equal to Lt LE for improved mobility with gait and stairs and symmetrical mechanics.     Baseline  06/18/18:  AROM 0-132 degrees was 10/17: ROM 5-115 and 0-95 initial eval    Status  Partially Met      PT SHORT TERM GOAL #3   Title  Patient will improve MMT by 1/2 grade for all limited groups to inidicate signifiacant improvement in functional LE strength.    Baseline  10/24: see MMT    Status  On-going      PT SHORT  TERM GOAL #4   Title  Patient will have reduced edema of Rt knee and circumference of knee joints will be equal bil LE's to indicate reduced swelling ofr improve ROM, muscle activation, and decresaed pain.     Status  On-going        PT Long Term Goals - 06/11/18 1725      PT LONG TERM GOAL #1   Title  Patient will improve MMT by 1 grade for all limited groups to inidicate signifiacant improvement in functional LE strength.    Time  6    Period  Weeks    Status  On-going      PT LONG TERM GOAL #2   Title  Patient will achieve full Rt knee ROM up to WNL's or equal to Lt LE for improved mobility with gait and stairs and symmetrical mechanics.     Time  6    Period  Weeks    Status  On-going      PT LONG TERM GOAL #3   Title  Patient will demonstrate safe squat mechanics to progress closed chain exercises within protocol while protecting ACL repair to improve strenght and reudce risk for re-injury.    Time  6    Period  Weeks    Status  On-going            Plan - 07/09/18 1810    Clinical Impression Statement  continued per Harrison's ACL protocol in week 7.  Began hamstring cybex today with low weight, high reps.  decreased kinetic speed today on biodex and increased hold times with vectors.  cues needed with step downs for control and knee stability.  Pt is overall progressing well without c/o pain.      Rehab Potential  Good    PT Frequency  3x / week    PT Duration  6 weeks    PT Treatment/Interventions  ADLs/Self Care Home Management;Aquatic Therapy;Cryotherapy;Electrical Stimulation;Gait training;Functional mobility Scientist, forensic;Therapeutic activities;Therapeutic exercise;Balance training;Neuromuscular re-education;Patient/family education;Manual techniques;Passive range of motion;Taping;Scar mobilization    PT Next Visit Plan  Follow ACL protocol per Dr. Aline Brochure, patient 7 weeks post-op 07/06/18.  Next session attempt single leg quarter squat ( only complete if  no pain) and increase hold times of wall sitting to 15  seconds.  Add lunges week of 07/13/18.    PT Home Exercise Plan  Eval: heel slide, quad set, knee flexion standing.     Consulted and Agree with Plan of Care  Patient       Patient will benefit from skilled therapeutic intervention in order to improve the following deficits and impairments:  Abnormal gait, Decreased skin integrity, Pain, Decreased mobility, Decreased scar mobility, Decreased strength, Decreased range of motion, Decreased endurance, Decreased activity tolerance, Decreased balance, Difficulty walking, Increased edema, Impaired flexibility  Visit Diagnosis: Acute pain of right knee  Muscle weakness (generalized)  Other abnormalities of gait and mobility     Problem List Patient Active Problem List   Diagnosis Date Noted  . S/P right knee arthroscopy 02/05/18 06/23/2017  . S/P ACL repair right 05/19/18   . Chondromalacia of lateral femoral condyle, right   . Chondromalacia, patella, right    Teena Irani, PTA/CLT [phone]  Teena Irani 07/09/2018, 6:14 PM  [place] [address] [place], [place], [address] Phone: [phone]   Fax:  [phone]  Name: Danell Verno Ledvina MRN: [id number] Date of Birth: 12-26-1975

## 2018-07-10 ENCOUNTER — Ambulatory Visit (HOSPITAL_COMMUNITY): Payer: Medicaid Other | Admitting: Physical Therapy

## 2018-07-10 ENCOUNTER — Telehealth (HOSPITAL_COMMUNITY): Payer: Self-pay

## 2018-07-10 NOTE — Telephone Encounter (Signed)
Pt's wife called he is not feeling good today and can not come in

## 2018-07-13 ENCOUNTER — Ambulatory Visit (HOSPITAL_COMMUNITY): Payer: Medicaid Other

## 2018-07-13 ENCOUNTER — Encounter (HOSPITAL_COMMUNITY): Payer: Self-pay

## 2018-07-13 ENCOUNTER — Other Ambulatory Visit: Payer: Self-pay

## 2018-07-13 DIAGNOSIS — R2689 Other abnormalities of gait and mobility: Secondary | ICD-10-CM | POA: Diagnosis not present

## 2018-07-13 DIAGNOSIS — M25561 Pain in right knee: Secondary | ICD-10-CM | POA: Diagnosis not present

## 2018-07-13 DIAGNOSIS — M6281 Muscle weakness (generalized): Secondary | ICD-10-CM

## 2018-07-13 NOTE — Therapy (Signed)
[place], [place], [address] Phone: [phone]   Fax:  [phone]  Physical Therapy Treatment  Patient Details  Name: Victor [name] MRN: [id number] Date of Birth: Jul 31, 1976 Referring Provider (PT): Adonis Huguenin, MD   Encounter Date: 07/13/2018  PT End of Session - 07/13/18 1137    Visit Number  12    Number of Visits  19    Date for PT Re-Evaluation  07/21/18   Minireassess 06/30/18   Authorization Type  Medicaid [place]  12 visits approved10/28-11/24    Authorization Time Period  06/09/18 - 07/24/18.  3 visits approved 10/17-10/23; 12 visits approved10/28-11/24    Authorization - Visit Number  8    Authorization - Number of Visits  12    PT Start Time  1118    PT Stop Time  1200    PT Time Calculation (min)  42 min    Activity Tolerance  Patient tolerated treatment well    Behavior During Therapy  [facility] for tasks assessed/performed       Past Medical History:  Diagnosis Date  . GERD (gastroesophageal reflux disease)   . History of stomach ulcers   . Hypertension     Past Surgical History:  Procedure Laterality Date  . ANTERIOR CRUCIATE LIGAMENT REPAIR Right 05/19/2018   Procedure: RIGHT KNEE ARTHROSCOPY WITH ANTERIOR CRUCIATE LIGAMENT (ACL) REPAIR and MEDIAL MENISECTOMY;  Surgeon: [name], MD;  Location: [facility];  Service: Orthopedics;  Laterality: Right;  . APPENDECTOMY    . KNEE ARTHROSCOPY WITH MEDIAL MENISECTOMY Right 05/29/2017   Procedure: KNEE ARTHROSCOPY WITH MEDIAL MENISECTOMY and lateral meniscectomy;  Surgeon: [name], MD;  Location: [facility];  Service: Orthopedics;  Laterality: Right;  . KNEE ARTHROSCOPY WITH MEDIAL MENISECTOMY Right 02/05/2018   Procedure: RIGHT KNEE ARTHROSCOPY WITH PARTIAL MEDIAL AND LATERAL MENISECTOMY;  Surgeon: [name], MD;  Location: [facility];  Service: Orthopedics;  Laterality: Right;    There were no vitals filed for this  visit.  Subjective Assessment - 07/13/18 1119    Subjective  Patient reports minimal aching today around a 1-2/10. He states was able to get out this weekend and he got a deer. He wore his brace and reports no difficulty with walking to the blind. He states his son helped him load the deer and he does not process it himself.     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  Yes    Pain Score  2     Pain Location  Knee    Pain Orientation  Right    Pain Descriptors / Indicators  Aching    Pain Type  Surgical pain    Pain Onset  More than a month ago    Pain Frequency  Intermittent    Aggravating Factors   pronlonged walking or activity    Pain Relieving Factors  ice, rest        [facility] Adult PT Treatment/Exercise - 07/13/18 0001      Exercises   Exercises  Knee/Hip      Knee/Hip Exercises: Aerobic   Elliptical  4 minute on Level 2 for warm up    Other Aerobic  Resisted Walking with purple sports cord: 3x fwd/bkwd down 50' hallway  Knee/Hip Exercises: Machines for Strengthening   Cybex Knee Flexion  Rt LE only 3PL 2x 10 reps    Cybex Leg Press  [employer]: 2x 15 reps, 5 plates ([id number])    Other Machine  Biodex: isokinetic ROM (90-40) at 120, 90, 60 degrees/sec; 2x 10 reps flex/ext at each rate      Knee/Hip Exercises: Standing   Forward Lunges  2 sets;10 reps;Limitations;Both    Forward Lunges Limitations  4" box    Forward Step Up  Right;15 reps;Hand Hold: 0;Step Height: 6"    Step Down  Right;Step Height: 2";Hand Hold: 0;15 reps;2 sets    SLS with Vectors  3 way vector Rt  LE 15 reps, 3 sec holds each way, on foam    Other Standing Knee Exercises  Backwards step up: Right;15 reps;Hand Hold: 0;Step Height: 6"      Knee/Hip Exercises: Supine   Straight Leg Raises  Right;2 sets;10 reps    Straight Leg Raises Limitations  no  extension lag         PT Education - 07/13/18 1134    Education Details  Educated on exercises and progressed HEP per protocol. Educated on plan to re-assess next visit and submit for more therapy sessions.     Person(s) Educated  Patient    Methods  Explanation;Demonstration    Comprehension  Verbalized understanding;Returned demonstration       PT Short Term Goals - 06/18/18 1655      PT SHORT TERM GOAL #1   Title  Patient will be independent with HEP, updated PRN based on stage in protocol, to promote ACL repair healing and stability of Rt knee to return to work activitites and PLOF.    Baseline  06/18/18:  Reports compliance with HEP daily    Status  Achieved      PT SHORT TERM GOAL #2   Title  Patient will achieve 0-115 Rt knee ROM up to WNL's or equal to Lt LE for improved mobility with gait and stairs and symmetrical mechanics.     Baseline  06/18/18:  AROM 0-132 degrees was 10/17: ROM 5-115 and 0-95 initial eval    Status  Partially Met      PT SHORT TERM GOAL #3   Title  Patient will improve MMT by 1/2 grade for all limited groups to inidicate signifiacant improvement in functional LE strength.    Baseline  10/24: see MMT    Status  On-going      PT SHORT TERM GOAL #4   Title  Patient will have reduced edema of Rt knee and circumference of knee joints will be equal bil LE's to indicate reduced swelling ofr improve ROM, muscle activation, and decresaed pain.     Status  On-going        PT Long Term Goals - 06/11/18 1725      PT LONG TERM GOAL #1   Title  Patient will improve MMT by 1 grade for all limited groups to inidicate signifiacant improvement in functional LE strength.    Time  6    Period  Weeks    Status  On-going      PT LONG TERM GOAL #2   Title  Patient will achieve full Rt knee ROM up to WNL's or equal to Lt LE for improved mobility with gait and stairs and symmetrical mechanics.     Time  6    Period  Weeks    Status  On-going  PT LONG TERM  GOAL #3   Title  Patient will demonstrate safe squat mechanics to progress closed chain exercises within protocol while protecting ACL repair to improve strenght and reudce risk for re-injury.    Time  6    Period  Weeks    Status  On-going        Plan - 07/13/18 1134    Clinical Impression Statement  Patient is now 8 weeks s/p ACL repair and this session was able to progress to late stage II exercises. He initiated lunges and continued with step-u/down training. Isokinetic strengthening was progressed to greater resistance and he denied pain during this exercise. Resisted walking was initiated today as well and weighted machine continued. At EOS her reported increase in pain of Rt knee aching about 3-4/10 and was encouraged to continue icing and elevating following therapy to alleviate pain and manage swelling as he has advanced exercise challenge. He will continue to benefit from skilled PT interventions to address impairments and progress safely through rehab protocol.    Rehab Potential  Good    PT Frequency  3x / week    PT Duration  6 weeks    PT Treatment/Interventions  ADLs/Self Care Home Management;Aquatic Therapy;Cryotherapy;Electrical Stimulation;Gait training;Functional mobility Scientist, forensic;Therapeutic activities;Therapeutic exercise;Balance training;Neuromuscular re-education;Patient/family education;Manual techniques;Passive range of motion;Taping;Scar mobilization    PT Next Visit Plan  Follow ACL protocol per Dr. Aline Brochure, patient 8 weeks post-op 07/13/18.  Next session attempt single leg quarter squat (only complete if no pain) and increase hold times of wall sitting to 15 seconds.  Monitor pain throughout. Re-assess next session to resubmit Medicaid auth on Friday 07/17/18.    PT Home Exercise Plan  Eval: heel slide, quad set, knee flexion standing.     Consulted and Agree with Plan of Care  Patient       Patient will benefit from skilled therapeutic intervention in  order to improve the following deficits and impairments:  Abnormal gait, Decreased skin integrity, Pain, Decreased mobility, Decreased scar mobility, Decreased strength, Decreased range of motion, Decreased endurance, Decreased activity tolerance, Decreased balance, Difficulty walking, Increased edema, Impaired flexibility  Visit Diagnosis: Acute pain of right knee  Muscle weakness (generalized)  Other abnormalities of gait and mobility     Problem List Patient Active Problem List   Diagnosis Date Noted  . S/P right knee arthroscopy 02/05/18 06/23/2017  . S/P ACL repair right 05/19/18   . Chondromalacia of lateral femoral condyle, right   . Chondromalacia, patella, right     Victor [name], PT, DPT Physical Therapist with [facility]  07/13/2018 12:06 PM    [place] [address] [place], [place], [address] Phone: [phone]   Fax:  [phone]  Name: Dyshaun Bonzo Payton MRN: [id number] Date of Birth: [birth year]

## 2018-07-15 ENCOUNTER — Encounter (HOSPITAL_COMMUNITY): Payer: Self-pay

## 2018-07-15 ENCOUNTER — Ambulatory Visit (HOSPITAL_COMMUNITY): Payer: Medicaid Other

## 2018-07-15 DIAGNOSIS — M6281 Muscle weakness (generalized): Secondary | ICD-10-CM

## 2018-07-15 DIAGNOSIS — R2689 Other abnormalities of gait and mobility: Secondary | ICD-10-CM | POA: Diagnosis not present

## 2018-07-15 DIAGNOSIS — M25561 Pain in right knee: Secondary | ICD-10-CM | POA: Diagnosis not present

## 2018-07-15 NOTE — Therapy (Signed)
[place], [place], [address] Phone: [phone]   Fax:  [phone]  Physical Therapy Treatment  Patient Details  Name: Victor [name] MRN: [id number] Date of Birth: [birth year] Referring Provider (PT): Adonis Huguenin, MD   Encounter Date: 07/15/2018  PT End of Session - 07/15/18 1223    Visit Number  13    Number of Visits  19    Date for PT Re-Evaluation  07/21/18   Minireassess 06/30/18   Authorization Type  Medicaid [place]  12 visits approved10/28-11/24    Authorization Time Period  06/09/18 - 07/24/18.  3 visits approved 10/17-10/23; 12 visits approved10/28-11/24    Authorization - Visit Number  9    Authorization - Number of Visits  12    PT Start Time  1120    PT Stop Time  1203    PT Time Calculation (min)  43 min    Activity Tolerance  Patient tolerated treatment well    Behavior During Therapy  [facility] for tasks assessed/performed       Past Medical History:  Diagnosis Date  . GERD (gastroesophageal reflux disease)   . History of stomach ulcers   . Hypertension     Past Surgical History:  Procedure Laterality Date  . ANTERIOR CRUCIATE LIGAMENT REPAIR Right 05/19/2018   Procedure: RIGHT KNEE ARTHROSCOPY WITH ANTERIOR CRUCIATE LIGAMENT (ACL) REPAIR and MEDIAL MENISECTOMY;  Surgeon: [name], MD;  Location: [facility];  Service: Orthopedics;  Laterality: Right;  . APPENDECTOMY    . KNEE ARTHROSCOPY WITH MEDIAL MENISECTOMY Right 05/29/2017   Procedure: KNEE ARTHROSCOPY WITH MEDIAL MENISECTOMY and lateral meniscectomy;  Surgeon: [name], MD;  Location: [facility];  Service: Orthopedics;  Laterality: Right;  . KNEE ARTHROSCOPY WITH MEDIAL MENISECTOMY Right 02/05/2018   Procedure: RIGHT KNEE ARTHROSCOPY WITH PARTIAL MEDIAL AND LATERAL MENISECTOMY;  Surgeon: [name], MD;  Location: [facility];  Service: Orthopedics;  Laterality: Right;    There were no vitals filed for this  visit.  Subjective Assessment - 07/15/18 1120    Subjective  Pt reports increased pain posterior knee following last session, pain scale 5-6/10 intermittent sharp and achey pain.    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 lbs    Pain Score  6     Pain Location  Knee    Pain Orientation  Right    Pain Descriptors / Indicators  Aching;Sharp    Pain Type  Surgical pain    Pain Onset  More than a month ago    Pain Frequency  Intermittent    Aggravating Factors   prolonged walking or activity    Pain Relieving Factors  ice, rest                       [facility] Adult PT Treatment/Exercise - 07/15/18 0001      Exercises   Exercises  Knee/Hip      Knee/Hip Exercises: Stretches   Active Hamstring Stretch  3 reps;30 seconds    Active Hamstring Stretch Limitations  supine with rope      Knee/Hip Exercises: Aerobic   Elliptical  4 minute on Level 2 for warm up    Other Aerobic  Resisted Walking with purple sports cord: 3x fwd/bkwd down 50' hallway  Knee/Hip Exercises: Machines for Strengthening   Cybex Knee Flexion  Rt LE only 3PL 2x 10 reps    Cybex Leg Press  [employer]: 2x 15 reps, 5 plates (81OFB)    Other Machine  Biodex: isokinetic ROM (90-40) at 120, 90, 60 degrees/sec; 2x 10 reps flex/ext at each rate      Knee/Hip Exercises: Standing   Forward Lunges  15 reps;Both    Forward Lunges Limitations  4" box    Forward Step Up  Right;15 reps;Hand Hold: 0;Step Height: 6"    Step Down  Right;2 sets;10 reps;Hand Hold: 1;Step Height: 4"    Functional Squat  15 reps    Functional Squat Limitations  chair taps for squat    Other Standing Knee Exercises  Backwards step up: Right;15 reps;Hand Hold: 0;Step Height: 6"      Knee/Hip Exercises: Seated   Stool Scoot - Round Trips  Hamstring stool scoot, 2x RT, 30'      Manual Therapy   Manual  Therapy  Edema management;Taping;Other (comment)    Manual therapy comments  completed seperate form all other skilled interventions    Edema Management  retro massage Rt knee    Other Manual Therapy  Kinesiotaping for edema control               PT Short Term Goals - 06/18/18 1655      PT SHORT TERM GOAL #1   Title  Patient will be independent with HEP, updated PRN based on stage in protocol, to promote ACL repair healing and stability of Rt knee to return to work activitites and PLOF.    Baseline  06/18/18:  Reports compliance with HEP daily    Status  Achieved      PT SHORT TERM GOAL #2   Title  Patient will achieve 0-115 Rt knee ROM up to WNL's or equal to Lt LE for improved mobility with gait and stairs and symmetrical mechanics.     Baseline  06/18/18:  AROM 0-132 degrees was 10/17: ROM 5-115 and 0-95 initial eval    Status  Partially Met      PT SHORT TERM GOAL #3   Title  Patient will improve MMT by 1/2 grade for all limited groups to inidicate signifiacant improvement in functional LE strength.    Baseline  10/24: see MMT    Status  On-going      PT SHORT TERM GOAL #4   Title  Patient will have reduced edema of Rt knee and circumference of knee joints will be equal bil LE's to indicate reduced swelling ofr improve ROM, muscle activation, and decresaed pain.     Status  On-going        PT Long Term Goals - 06/11/18 1725      PT LONG TERM GOAL #1   Title  Patient will improve MMT by 1 grade for all limited groups to inidicate signifiacant improvement in functional LE strength.    Time  6    Period  Weeks    Status  On-going      PT LONG TERM GOAL #2   Title  Patient will achieve full Rt knee ROM up to WNL's or equal to Lt LE for improved mobility with gait and stairs and symmetrical mechanics.     Time  6    Period  Weeks    Status  On-going      PT LONG TERM GOAL #3   Title  Patient will demonstrate safe  squat mechanics to progress closed chain exercises  within protocol while protecting ACL repair to improve strenght and reudce risk for re-injury.    Time  6    Period  Weeks    Status  On-going            Plan - 07/15/18 1319    Clinical Impression Statement  Pt is now 8.5 week post-op ACL reconstruction.  Pt arrived with reports of increased pain and edema present proximal knee following last session so held all new exercises.  Resumed retromassage for edema control and kinesiotape for edema control to assist with pain control.  Continued session focus with quad and hamstring strengthening in CKC with monitored mechanics with all therex with no reports of increased pain.  Reviewed RICE technqiues to assist with pain and edema control following tx.      Rehab Potential  Good    PT Frequency  3x / week    PT Duration  6 weeks    PT Treatment/Interventions  ADLs/Self Care Home Management;Aquatic Therapy;Cryotherapy;Electrical Stimulation;Gait training;Functional mobility Scientist, forensic;Therapeutic activities;Therapeutic exercise;Balance training;Neuromuscular re-education;Patient/family education;Manual techniques;Passive range of motion;Taping;Scar mobilization    PT Next Visit Plan  Follow ACL protocol per Dr. Aline Brochure, patient 8 weeks post-op 07/13/18.  Next session attempt single leg quarter squat (only complete if no pain) and increase hold times of wall sitting to 15 seconds.  Monitor pain throughout. Re-assess next session to resubmit Medicaid auth on Friday 07/17/18.    PT Home Exercise Plan  Eval: heel slide, quad set, knee flexion standing.        Patient will benefit from skilled therapeutic intervention in order to improve the following deficits and impairments:  Abnormal gait, Decreased skin integrity, Pain, Decreased mobility, Decreased scar mobility, Decreased strength, Decreased range of motion, Decreased endurance, Decreased activity tolerance, Decreased balance, Difficulty walking, Increased edema, Impaired  flexibility  Visit Diagnosis: Acute pain of right knee  Muscle weakness (generalized)  Other abnormalities of gait and mobility     Problem List Patient Active Problem List   Diagnosis Date Noted  . S/P right knee arthroscopy 02/05/18 06/23/2017  . S/P ACL repair right 05/19/18   . Chondromalacia of lateral femoral condyle, right   . Chondromalacia, patella, right    Ihor Austin, LPTA; CBIS [phone]  Aldona Lento 07/15/2018, 1:45 PM  [facility] [address] [place], [place], [address] Phone: [phone]   Fax:  [phone]  Name: Victor [name] MRN: [id number] Date of Birth: 12-26-1975

## 2018-07-17 ENCOUNTER — Ambulatory Visit (HOSPITAL_COMMUNITY): Payer: Medicaid Other

## 2018-07-17 ENCOUNTER — Telehealth (HOSPITAL_COMMUNITY): Payer: Self-pay

## 2018-07-17 NOTE — Telephone Encounter (Signed)
No show, called and left message concerning missed apt.  Included next apt date and time with contact information given.    [address], [place]; CBIS [phone]

## 2018-07-20 ENCOUNTER — Telehealth (HOSPITAL_COMMUNITY): Payer: Self-pay

## 2018-07-20 ENCOUNTER — Ambulatory Visit (HOSPITAL_COMMUNITY): Payer: Medicaid Other

## 2018-07-20 NOTE — Telephone Encounter (Signed)
l/m to cx this apptment -Ins not approved- requested return phone call from pt. NF 10:09am 07/20/18

## 2018-07-20 NOTE — Telephone Encounter (Signed)
Pt called back to confrim cx-lation of todays apptment. Cx waiting on approval from patient-  I told the patient we would be in touch with him again if we did not get approval. He knows to come to his next apptment if he dosen't hear from [place].

## 2018-07-21 ENCOUNTER — Telehealth (HOSPITAL_COMMUNITY): Payer: Self-pay

## 2018-07-21 NOTE — Telephone Encounter (Signed)
Medicaid approved 12 visits 11/26-12/23/2019 scanned epic.NF  L/m that apptment is approved by ins.Requested return phone call to confirm pt received message.  NF 07/21/18

## 2018-07-22 ENCOUNTER — Ambulatory Visit (HOSPITAL_COMMUNITY): Payer: Medicaid Other

## 2018-07-22 ENCOUNTER — Encounter (HOSPITAL_COMMUNITY): Payer: Self-pay

## 2018-07-22 DIAGNOSIS — R2689 Other abnormalities of gait and mobility: Secondary | ICD-10-CM | POA: Diagnosis not present

## 2018-07-22 DIAGNOSIS — M25561 Pain in right knee: Secondary | ICD-10-CM

## 2018-07-22 DIAGNOSIS — M6281 Muscle weakness (generalized): Secondary | ICD-10-CM | POA: Diagnosis not present

## 2018-07-22 NOTE — Therapy (Signed)
[place], [place], [address] Phone: [phone]   Fax:  [phone]   Progress Note Reporting Period 06/30/18 to 07/22/18  See note below for Objective Data and Assessment of Progress/Goals.     Physical Therapy Treatment  Patient Details  Name: Victor [name] MRN: [id number] Date of Birth: 14-Oct-1975 Referring Provider (PT): Adonis Huguenin, MD   Encounter Date: 07/22/2018  PT End of Session - 07/22/18 0947    Visit Number  13    Number of Visits  33    Date for PT Re-Evaluation  08/17/18    Authorization Type  Medicaid [place]  12 visits approved 07/21/18 to 08/17/18    Authorization Time Period  06/09/18 - 07/24/18.  3 visits approved 10/17-10/23; 12 visits approved10/28-11/24; another 12 visits approved 07/21/18 to 08/17/18    Authorization - Visit Number  1    Authorization - Number of Visits  12    PT Start Time  0947    PT Stop Time  1028    PT Time Calculation (min)  41 min    Activity Tolerance  Patient tolerated treatment well    Behavior During Therapy  [facility] for tasks assessed/performed       Past Medical History:  Diagnosis Date  . GERD (gastroesophageal reflux disease)   . History of stomach ulcers   . Hypertension     Past Surgical History:  Procedure Laterality Date  . ANTERIOR CRUCIATE LIGAMENT REPAIR Right 05/19/2018   Procedure: RIGHT KNEE ARTHROSCOPY WITH ANTERIOR CRUCIATE LIGAMENT (ACL) REPAIR and MEDIAL MENISECTOMY;  Surgeon: [name], MD;  Location: [facility];  Service: Orthopedics;  Laterality: Right;  . APPENDECTOMY    . KNEE ARTHROSCOPY WITH MEDIAL MENISECTOMY Right 05/29/2017   Procedure: KNEE ARTHROSCOPY WITH MEDIAL MENISECTOMY and lateral meniscectomy;  Surgeon: [name], MD;  Location: [facility];  Service: Orthopedics;  Laterality: Right;  . KNEE ARTHROSCOPY WITH MEDIAL MENISECTOMY Right 02/05/2018   Procedure: RIGHT KNEE ARTHROSCOPY WITH PARTIAL MEDIAL AND  LATERAL MENISECTOMY;  Surgeon: [name], MD;  Location: [facility];  Service: Orthopedics;  Laterality: Right;    There were no vitals filed for this visit.  Subjective Assessment - 07/22/18 0948    Subjective  Pt states that his knee has been hurting more than usual over the last few days. He states that the other night he hung his toe on the step when going up and it sort of pulled it in the back. Current pain about 5/10.    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  Yes    Pain Score  5     Pain Location  Knee    Pain Orientation  Right    Pain Descriptors / Indicators  Sharp;Aching    Pain Type  Surgical pain    Pain Onset  More than a month ago    Pain Frequency  Intermittent    Aggravating Factors   prolonged walking or activity    Pain Relieving Factors  ice, rest         [facility] PT Assessment - 07/22/18 0001      Assessment   Medical Diagnosis  s/p ACL repair    Referring Provider (PT)  Adonis Huguenin, MD  Onset Date/Surgical Date  05/19/18    Hand Dominance  Right    Next MD Visit  08/12/18    Prior Therapy  none      Precautions   Precautions  Knee    Precaution Comments  ACL Repair protocol Aline Brochure)    Required Braces or Orthoses  Other Brace/Splint    Other Brace/Splint  wears brace when on unstable ground      Circumferential Edema   Circumferential - Right  15.5" joint line   was 16.75" at joint line   Circumferential - Left   15" joint line   was 16.5" joint line     Functional Tests   Functional tests  Squat      Squat   Comments  mild anterior knee translation, R knee pain, difficulty controlling movement      AROM   Right Knee Extension  0   was 0   Right Knee Flexion  132   was 132     Strength   Right Hip Flexion  5/5   was 4+   Right Hip Extension  4+/5   was 4+    Right Hip ABduction  4+/5   was 4+   Right Knee Flexion  4+/5   was 3+   Right Knee Extension  4+/5   was 3+           [facility] Adult PT Treatment/Exercise - 07/22/18 0001      Exercises   Exercises  Knee/Hip      Knee/Hip Exercises: Aerobic   Elliptical  4 minute on Level 3 for warm up/to simulate jogging      Knee/Hip Exercises: Machines for Strengthening   Cybex Knee Flexion  Rt LE only 4PL 2x 10 reps    Cybex Leg Press  [employer]: 2x15 reps, 6 plates (22QJF)      Manual Therapy   Manual Therapy  Joint mobilization;Edema management    Manual therapy comments  completed seperate form all other skilled interventions    Edema Management  retro massage Rt knee    Joint Mobilization  Grade III AP joint mobs for improved flexion ROM             PT Education - 07/22/18 0946    Education Details  reassessment findings, continue HEP    Person(s) Educated  Patient    Methods  Explanation;Demonstration    Comprehension  Verbalized understanding;Returned demonstration       PT Short Term Goals - 07/22/18 0951      PT SHORT TERM GOAL #1   Title  Patient will be independent with HEP, updated PRN based on stage in protocol, to promote ACL repair healing and stability of Rt knee to return to work activitites and PLOF.    Baseline  06/18/18:  Reports compliance with HEP daily    Status  Achieved      PT SHORT TERM GOAL #2   Title  Patient will achieve 0-115 Rt knee ROM up to WNL's or equal to Lt LE for improved mobility with gait and stairs and symmetrical mechanics.     Baseline  11/27: AROM 0-132deg, not symmetrical to LLE however    Status  Partially Met      PT SHORT TERM GOAL #3   Title  Patient will improve MMT by 1/2 grade for all limited groups to inidicate signifiacant improvement in functional LE strength.    Baseline  11/27: see MMT    Status  Partially  Met      PT SHORT TERM GOAL #4   Title  Patient will have reduced edema of Rt knee and circumference of  knee joints will be equal bil LE's to indicate reduced swelling ofr improve ROM, muscle activation, and decresaed pain.     Status  Achieved        PT Long Term Goals - 07/22/18 0951      PT LONG TERM GOAL #1   Title  Patient will improve MMT by 1 grade for all limited groups to inidicate signifiacant improvement in functional LE strength.    Baseline  11/27: see MMT    Time  6    Period  Weeks    Status  Partially Met      PT LONG TERM GOAL #2   Title  Patient will achieve full Rt knee ROM up to WNL's or equal to Lt LE for improved mobility with gait and stairs and symmetrical mechanics.     Baseline  11/27: 0-132deg    Time  6    Period  Weeks    Status  On-going      PT LONG TERM GOAL #3   Title  Patient will demonstrate safe squat mechanics to progress closed chain exercises within protocol while protecting ACL repair to improve strenght and reudce risk for re-injury.    Time  6    Period  Weeks    Status  On-going            Plan - 07/22/18 1032    Clinical Impression Statement  PT reassessed pt's goals and outcome measures this date. Pt has overall made good progress towards goals as illustrated above. His AROM is 0-132deg, but he is still lacking 10-15deg from being symmetrical with LLE. His swelling has reduced but he still has approximately 1/2 an inch of swelling at joint line compared to the L. His squat mechanics noted to be deficient and he still has pain with this. Pt needs continued skilled PT intervention in order to address these remaining impairments in order to maximize overall function and promote return to PLOF. Ended session with machine strengthening and manual for joint mobility and edema. Began joint mobs today for knee flexion, which he tolerated well. Continue as planned, progressing as his protocol allows.     Rehab Potential  Good    PT Frequency  3x / week    PT Duration  6 weeks    PT Treatment/Interventions  ADLs/Self Care Home  Management;Aquatic Therapy;Cryotherapy;Electrical Stimulation;Gait training;Functional mobility Scientist, forensic;Therapeutic activities;Therapeutic exercise;Balance training;Neuromuscular re-education;Patient/family education;Manual techniques;Passive range of motion;Taping;Scar mobilization    PT Next Visit Plan  Update his HEP; Follow ACL protocol per Dr. Aline Brochure, patient 9 weeks post-op 07/21/18.  Next session attempt single leg quarter squat (only complete if no pain) and increase hold times of wall sitting to 15 seconds.  Monitor pain throughout. Re-assess next session to resubmit Medicaid auth on Friday 07/17/18.    PT Home Exercise Plan  Eval: heel slide, quad set, knee flexion standing.     Consulted and Agree with Plan of Care  Patient       Patient will benefit from skilled therapeutic intervention in order to improve the following deficits and impairments:  Abnormal gait, Decreased skin integrity, Pain, Decreased mobility, Decreased scar mobility, Decreased strength, Decreased range of motion, Decreased endurance, Decreased activity tolerance, Decreased balance, Difficulty walking, Increased edema, Impaired flexibility  Visit Diagnosis: Acute pain of right knee - Plan: PT  plan of care cert/re-cert  Muscle weakness (generalized) - Plan: PT plan of care cert/re-cert  Other abnormalities of gait and mobility - Plan: PT plan of care cert/re-cert     Problem List Patient Active Problem List   Diagnosis Date Noted  . S/P right knee arthroscopy 02/05/18 06/23/2017  . S/P ACL repair right 05/19/18   . Chondromalacia of lateral femoral condyle, right   . Chondromalacia, patella, right         Geraldine Solar PT, DPT  [facility] [address] [place], [place], [address] Phone: [phone]   Fax:  [phone]  Name: Victor [name] MRN: [id number] Date of Birth: 03/22/1976

## 2018-07-28 ENCOUNTER — Ambulatory Visit (HOSPITAL_COMMUNITY): Payer: Medicaid Other | Admitting: Physical Therapy

## 2018-07-28 ENCOUNTER — Telehealth (HOSPITAL_COMMUNITY): Payer: Self-pay | Admitting: Physical Therapy

## 2018-07-28 NOTE — Telephone Encounter (Signed)
Unable to make it

## 2018-07-29 ENCOUNTER — Ambulatory Visit: Payer: Medicaid Other

## 2018-07-29 ENCOUNTER — Ambulatory Visit (HOSPITAL_COMMUNITY): Payer: Medicaid Other | Attending: Orthopedic Surgery | Admitting: Physical Therapy

## 2018-07-29 DIAGNOSIS — M25561 Pain in right knee: Secondary | ICD-10-CM

## 2018-07-29 DIAGNOSIS — M6281 Muscle weakness (generalized): Secondary | ICD-10-CM | POA: Diagnosis not present

## 2018-07-29 DIAGNOSIS — R2689 Other abnormalities of gait and mobility: Secondary | ICD-10-CM | POA: Insufficient documentation

## 2018-07-29 NOTE — Therapy (Signed)
[place], [place], [address] Phone: [phone]   Fax:  [phone]  Physical Therapy Treatment  Patient Details  Name: Victor [name] MRN: [id number] Date of Birth: 09-01-1975 Referring Provider (PT): Adonis Huguenin, MD   Encounter Date: 07/29/2018  PT End of Session - 07/29/18 1002    Visit Number  14    Number of Visits  33    Date for PT Re-Evaluation  08/17/18    Authorization Type  Medicaid [place]  12 visits approved 07/21/18 to 08/17/18    Authorization Time Period  06/09/18 - 07/24/18.  3 visits approved 10/17-10/23; 12 visits approved10/28-11/24; another 12 visits approved 07/21/18 to 08/17/18    Authorization - Visit Number  14    Authorization - Number of Visits  27    PT Start Time  0904    PT Stop Time  0946    PT Time Calculation (min)  42 min    Activity Tolerance  Patient tolerated treatment well    Behavior During Therapy  [facility] for tasks assessed/performed       Past Medical History:  Diagnosis Date  . GERD (gastroesophageal reflux disease)   . History of stomach ulcers   . Hypertension     Past Surgical History:  Procedure Laterality Date  . ANTERIOR CRUCIATE LIGAMENT REPAIR Right 05/19/2018   Procedure: RIGHT KNEE ARTHROSCOPY WITH ANTERIOR CRUCIATE LIGAMENT (ACL) REPAIR and MEDIAL MENISECTOMY;  Surgeon: [name], MD;  Location: [facility];  Service: Orthopedics;  Laterality: Right;  . APPENDECTOMY    . KNEE ARTHROSCOPY WITH MEDIAL MENISECTOMY Right 05/29/2017   Procedure: KNEE ARTHROSCOPY WITH MEDIAL MENISECTOMY and lateral meniscectomy;  Surgeon: [name], MD;  Location: [facility];  Service: Orthopedics;  Laterality: Right;  . KNEE ARTHROSCOPY WITH MEDIAL MENISECTOMY Right 02/05/2018   Procedure: RIGHT KNEE ARTHROSCOPY WITH PARTIAL MEDIAL AND LATERAL MENISECTOMY;  Surgeon: [name], MD;  Location: [facility];  Service: Orthopedics;  Laterality: Right;    There were no  vitals filed for this visit.  Subjective Assessment - 07/29/18 0902    Subjective  Pt states it 's sore today, hurting about 7/10.  States it went up after trying the single leg squat last visit.      Currently in Pain?  Yes    Pain Score  7     Pain Location  Knee    Pain Orientation  Right    Pain Descriptors / Indicators  Aching;Throbbing;Patsi Sears Adult PT Treatment/Exercise - 07/29/18 0001      Knee/Hip Exercises: Aerobic   Elliptical  4 minute on Level 3 for warm up/to simulate jogging      Knee/Hip Exercises: Machines for Strengthening   Cybex Knee Flexion  Rt LE only 4PL 2x 10 reps    Cybex Leg Press  Bodycraft: 2x15 reps, 6 plates (41OIN)    Other Machine  Biodex: isokinetic ROM (90-40) at 120, 90, 60 degrees/sec; 2x 10 reps flex/ext at each rate      Knee/Hip Exercises: Standing   Knee Flexion  Right;1 set;15 reps;Limitations    Knee Flexion Limitations  4" box end ROM    Forward Lunges  15 reps;Both    Forward Lunges Limitations  no box, onto 2" // bars platform    Forward Step Up  Right;15 reps;Hand Hold: 0;Step  Height: 6"    Step Down  Right;2 sets;10 reps;Hand Hold: 1;Step Height: 4";Limitations    Step Down Limitations  correct valgus, cues to reduce    Functional Squat  15 reps    Functional Squat Limitations  chair taps for squat    Wall Squat  5 reps    Wall Squat Limitations  wall sitting 15" holds    SLS with Vectors  3 way vector Rt  LE 10 reps, 5 sec holds each way, on foam               PT Short Term Goals - 07/22/18 0951      PT SHORT TERM GOAL #1   Title  Patient will be independent with HEP, updated PRN based on stage in protocol, to promote ACL repair healing and stability of Rt knee to return to work activitites and PLOF.    Baseline  06/18/18:  Reports compliance with HEP daily    Status  Achieved      PT SHORT TERM GOAL #2   Title  Patient will achieve 0-115 Rt knee ROM up to WNL's or equal to  Lt LE for improved mobility with gait and stairs and symmetrical mechanics.     Baseline  11/27: AROM 0-132deg, not symmetrical to LLE however    Status  Partially Met      PT SHORT TERM GOAL #3   Title  Patient will improve MMT by 1/2 grade for all limited groups to inidicate signifiacant improvement in functional LE strength.    Baseline  11/27: see MMT    Status  Partially Met      PT SHORT TERM GOAL #4   Title  Patient will have reduced edema of Rt knee and circumference of knee joints will be equal bil LE's to indicate reduced swelling ofr improve ROM, muscle activation, and decresaed pain.     Status  Achieved        PT Long Term Goals - 07/22/18 0951      PT LONG TERM GOAL #1   Title  Patient will improve MMT by 1 grade for all limited groups to inidicate signifiacant improvement in functional LE strength.    Baseline  11/27: see MMT    Time  6    Period  Weeks    Status  Partially Met      PT LONG TERM GOAL #2   Title  Patient will achieve full Rt knee ROM up to WNL's or equal to Lt LE for improved mobility with gait and stairs and symmetrical mechanics.     Baseline  11/27: 0-132deg    Time  6    Period  Weeks    Status  On-going      PT LONG TERM GOAL #3   Title  Patient will demonstrate safe squat mechanics to progress closed chain exercises within protocol while protecting ACL repair to improve strenght and reudce risk for re-injury.    Time  6    Period  Weeks    Status  On-going            Plan - 07/29/18 1039    Clinical Impression Statement  continued with Harrison's ACL protocol with curretnly in  week 10.  Discontnued knee drives and hamstring stretch due to ROM being [facility].  able to increase hold of squats to 15 seconds.  Cues to decrease valgus with forward step downs.  Pt with c/o pain from completing single leg slquat  so also discontinued this at this time and will revisit in several weeks.  If able to complete [place] then add lateral step ups as well.   Pt able to complete all exercises with little cues, much improved form with squats noted.       Rehab Potential  Good    PT Frequency  3x / week    PT Duration  6 weeks    PT Treatment/Interventions  ADLs/Self Care Home Management;Aquatic Therapy;Cryotherapy;Electrical Stimulation;Gait training;Functional mobility Scientist, forensic;Therapeutic activities;Therapeutic exercise;Balance training;Neuromuscular re-education;Patient/family education;Manual techniques;Passive range of motion;Taping;Scar mobilization    PT Next Visit Plan  Follow ACL protocol per Dr. Aline Brochure, patient 10 weeks post-op 07/29/18.  Monitor pain throughout.     PT Home Exercise Plan  Eval: heel slide, quad set, knee flexion standing.     Consulted and Agree with Plan of Care  Patient       Patient will benefit from skilled therapeutic intervention in order to improve the following deficits and impairments:  Abnormal gait, Decreased skin integrity, Pain, Decreased mobility, Decreased scar mobility, Decreased strength, Decreased range of motion, Decreased endurance, Decreased activity tolerance, Decreased balance, Difficulty walking, Increased edema, Impaired flexibility  Visit Diagnosis: Acute pain of right knee  Muscle weakness (generalized)  Other abnormalities of gait and mobility     Problem List Patient Active Problem List   Diagnosis Date Noted  . S/P right knee arthroscopy 02/05/18 06/23/2017  . S/P ACL repair right 05/19/18   . Chondromalacia of lateral femoral condyle, right   . Chondromalacia, patella, right    Teena Irani, PTA/CLT [phone]  Teena Irani 07/29/2018, 10:51 AM  [place] [address] [place], [place], [address] Phone: [phone]   Fax:  [phone]  Name: Jahron Hunsinger Salonga MRN: [id number] Date of Birth: [birth year]

## 2018-07-31 ENCOUNTER — Other Ambulatory Visit: Payer: Self-pay

## 2018-07-31 ENCOUNTER — Encounter (HOSPITAL_COMMUNITY): Payer: Self-pay

## 2018-07-31 ENCOUNTER — Ambulatory Visit (HOSPITAL_COMMUNITY): Payer: Medicaid Other

## 2018-07-31 DIAGNOSIS — R2689 Other abnormalities of gait and mobility: Secondary | ICD-10-CM | POA: Diagnosis not present

## 2018-07-31 DIAGNOSIS — M25561 Pain in right knee: Secondary | ICD-10-CM | POA: Diagnosis not present

## 2018-07-31 DIAGNOSIS — M6281 Muscle weakness (generalized): Secondary | ICD-10-CM

## 2018-07-31 NOTE — Therapy (Signed)
[place], [place], [address] Phone: [phone]   Fax:  [phone]  Physical Therapy Treatment  Patient Details  Name: Victor [name] MRN: [id number] Date of Birth: Cordon 09, 1977 Referring Provider (PT): Adonis Huguenin, MD   Encounter Date: 07/31/2018  PT End of Session - 07/31/18 1122    Visit Number  15    Number of Visits  33    Date for PT Re-Evaluation  08/17/18    Authorization Type  Medicaid [place]  12 visits approved 07/21/18 to 08/17/18    Authorization Time Period  06/09/18 - 07/24/18.  3 visits approved 10/17-10/23; 12 visits approved10/28-11/24; another 12 visits approved 07/21/18 to 08/17/18    Authorization - Visit Number  15    Authorization - Number of Visits  27    PT Start Time  1116    PT Stop Time  1156    PT Time Calculation (min)  40 min    Activity Tolerance  Patient tolerated treatment well    Behavior During Therapy  [facility] for tasks assessed/performed       Past Medical History:  Diagnosis Date  . GERD (gastroesophageal reflux disease)   . History of stomach ulcers   . Hypertension     Past Surgical History:  Procedure Laterality Date  . ANTERIOR CRUCIATE LIGAMENT REPAIR Right 05/19/2018   Procedure: RIGHT KNEE ARTHROSCOPY WITH ANTERIOR CRUCIATE LIGAMENT (ACL) REPAIR and MEDIAL MENISECTOMY;  Surgeon: [name], MD;  Location: [facility];  Service: Orthopedics;  Laterality: Right;  . APPENDECTOMY    . KNEE ARTHROSCOPY WITH MEDIAL MENISECTOMY Right 05/29/2017   Procedure: KNEE ARTHROSCOPY WITH MEDIAL MENISECTOMY and lateral meniscectomy;  Surgeon: [name], MD;  Location: [facility];  Service: Orthopedics;  Laterality: Right;  . KNEE ARTHROSCOPY WITH MEDIAL MENISECTOMY Right 02/05/2018   Procedure: RIGHT KNEE ARTHROSCOPY WITH PARTIAL MEDIAL AND LATERAL MENISECTOMY;  Surgeon: [name], MD;  Location: [facility];  Service: Orthopedics;  Laterality: Right;    There were no  vitals filed for this visit.   Subjective Assessment - 07/31/18 1120    Subjective  Patient reports 5/10 pain today and states he hasn't been out hunting due to his knee pain. He states a week or so ago he got his Rt foot caught on the edge of a step and he felt a pulling in his knee. He believes his pain has been worse since then.    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  Yes    Pain Score  5     Pain Location  Knee    Pain Orientation  Right    Pain Descriptors / Indicators  Aching;Throbbing;Sharp    Pain Type  Surgical pain    Pain Onset  More than a month ago    Pain Frequency  Intermittent    Aggravating Factors   walking prolonged activities    Pain Relieving Factors  ice, rest        [facility] PT Assessment - 07/31/18 0001      Circumferential Edema   Circumferential - Right  16.75 joint line    Circumferential - Left   16.5 joint line      AROM   Right Knee Extension  0  Right Knee Flexion  122       [facility] Adult PT Treatment/Exercise - 07/31/18 0001      Exercises   Exercises  Knee/Hip      Knee/Hip Exercises: Aerobic   Elliptical  4 minute on Level 3 for warm up/to simulate jogging      Knee/Hip Exercises: Machines for Strengthening   Cybex Leg Press  [employer]craft: 2x15 reps, 2 plates (14ERX), Rt LE only      Knee/Hip Exercises: Standing   Knee Flexion  Right;1 set;15 reps;Limitations    Forward Lunges  Right;5 reps    Forward Lunges Limitations  discontinued due to pain    Lateral Step Up  Right;2 sets;15 reps;Step Height: 4";Hand Hold: 0    Step Down  Right;2 sets;15 reps;Hand Hold: 0;Step Height: 2"    Wall Squat  1 set;15 reps;3 seconds    Wall Squat Limitations  60 degrees    SLS  Rt LE SLS on airex, 2x 20 rpes 1 kg ball toss and rebounder    SLS with Vectors  SLS on Rt LE, on airex, 15 reps for 3  way cone tap    Other Standing Knee Exercises  Side step with Green TB, 3x 15' RT         PT Education - 07/31/18 1122    Education Details  Educated on current ROM and on importance of ice/elevation to prevent increased swelling. Educated on exercises throughout.    Person(s) Educated  Patient    Methods  Explanation    Comprehension  Verbalized understanding;Returned demonstration       PT Short Term Goals - 07/22/18 0951      PT SHORT TERM GOAL #1   Title  Patient will be independent with HEP, updated PRN based on stage in protocol, to promote ACL repair healing and stability of Rt knee to return to work activitites and PLOF.    Baseline  06/18/18:  Reports compliance with HEP daily    Status  Achieved      PT SHORT TERM GOAL #2   Title  Patient will achieve 0-115 Rt knee ROM up to WNL's or equal to Lt LE for improved mobility with gait and stairs and symmetrical mechanics.     Baseline  11/27: AROM 0-132deg, not symmetrical to LLE however    Status  Partially Met      PT SHORT TERM GOAL #3   Title  Patient will improve MMT by 1/2 grade for all limited groups to inidicate signifiacant improvement in functional LE strength.    Baseline  11/27: see MMT    Status  Partially Met      PT SHORT TERM GOAL #4   Title  Patient will have reduced edema of Rt knee and circumference of knee joints will be equal bil LE's to indicate reduced swelling ofr improve ROM, muscle activation, and decresaed pain.     Status  Achieved        PT Long Term Goals - 07/22/18 0951      PT LONG TERM GOAL #1   Title  Patient will improve MMT by 1 grade for all limited groups to inidicate signifiacant improvement in functional LE strength.    Baseline  11/27: see MMT    Time  6    Period  Weeks    Status  Partially Met      PT LONG TERM GOAL #2   Title  Patient will achieve full Rt knee ROM up  to WNL's or equal to Lt LE for improved mobility with gait and stairs and symmetrical mechanics.      Baseline  11/27: 0-132deg    Time  6    Period  Weeks    Status  On-going      PT LONG TERM GOAL #3   Title  Patient will demonstrate safe squat mechanics to progress closed chain exercises within protocol while protecting ACL repair to improve strenght and reudce risk for re-injury.    Time  6    Period  Weeks    Status  On-going       Plan - 07/31/18 1123    Clinical Impression Statement  Patient is 10 weeks post op form Rt ACL reconstruction and has been progressing well with exercises. He has recently been complaining of increase Rt knee pain and swelling. Objective measures reveal some mild swelling but not a severe difference compared to Lt knee. He was able to progress leg press to single limb and performed step down with low height. He initiated proprioceptive training with SLS on foam and dual task activities. He denied pain with these exercise. Lunges were discontinued today as patient reported increased pain with this. He will continue to benefit from skilled PT interventions to address impairments and progress through protocol.    Rehab Potential  Good    PT Frequency  3x / week    PT Duration  6 weeks    PT Treatment/Interventions  ADLs/Self Care Home Management;Aquatic Therapy;Cryotherapy;Electrical Stimulation;Gait training;Functional mobility Scientist, forensic;Therapeutic activities;Therapeutic exercise;Balance training;Neuromuscular re-education;Patient/family education;Manual techniques;Passive range of motion;Taping;Scar mobilization    PT Next Visit Plan  Follow ACL protocol per Dr. Aline Brochure, patient 10 weeks post-op 07/29/18.  Monitor pain throughout. Discontinue lunges until patient's pain decreases.    PT Home Exercise Plan  Eval: heel slide, quad set, knee flexion standing.     Consulted and Agree with Plan of Care  Patient       Patient will benefit from skilled therapeutic intervention in order to improve the following deficits and impairments:  Abnormal gait,  Decreased skin integrity, Pain, Decreased mobility, Decreased scar mobility, Decreased strength, Decreased range of motion, Decreased endurance, Decreased activity tolerance, Decreased balance, Difficulty walking, Increased edema, Impaired flexibility  Visit Diagnosis: Acute pain of right knee  Muscle weakness (generalized)  Other abnormalities of gait and mobility     Problem List Patient Active Problem List   Diagnosis Date Noted  . S/P right knee arthroscopy 02/05/18 06/23/2017  . S/P ACL repair right 05/19/18   . Chondromalacia of lateral femoral condyle, right   . Chondromalacia, patella, right     Kipp Brood, PT, DPT Physical Therapist with [facility]  07/31/2018 12:14 PM    [place], [place], [address] Phone: [phone]   Fax:  [phone]  Name: Victor [name] MRN: [id number] Date of Birth: 05-Oct-1975

## 2018-08-03 ENCOUNTER — Other Ambulatory Visit: Payer: Self-pay

## 2018-08-03 ENCOUNTER — Ambulatory Visit (HOSPITAL_COMMUNITY): Payer: Medicaid Other

## 2018-08-03 ENCOUNTER — Encounter (HOSPITAL_COMMUNITY): Payer: Self-pay

## 2018-08-03 DIAGNOSIS — M6281 Muscle weakness (generalized): Secondary | ICD-10-CM | POA: Diagnosis not present

## 2018-08-03 DIAGNOSIS — M25561 Pain in right knee: Secondary | ICD-10-CM | POA: Diagnosis not present

## 2018-08-03 DIAGNOSIS — R2689 Other abnormalities of gait and mobility: Secondary | ICD-10-CM

## 2018-08-03 NOTE — Therapy (Signed)
[place], [place], [address] Phone: [phone]   Fax:  [phone]  Physical Therapy Treatment  Patient Details  Name: Victor [name] MRN: [id number] Date of Birth: 1976-04-04 Referring Provider (PT): Adonis Huguenin, MD   Encounter Date: 08/03/2018  PT End of Session - 08/03/18 0949    Visit Number  16    Number of Visits  33    Date for PT Re-Evaluation  08/17/18    Authorization Type  Medicaid [place]  12 visits approved 07/21/18 to 08/17/18    Authorization Time Period  06/09/18 - 07/24/18.  3 visits approved 10/17-10/23; 12 visits approved10/28-11/24; another 12 visits approved 07/21/18 to 08/17/18    Authorization - Visit Number  4    Authorization - Number of Visits  12    PT Start Time  4627    PT Stop Time  1028    PT Time Calculation (min)  41 min    Activity Tolerance  Patient tolerated treatment well    Behavior During Therapy  [facility] for tasks assessed/performed       Past Medical History:  Diagnosis Date  . GERD (gastroesophageal reflux disease)   . History of stomach ulcers   . Hypertension     Past Surgical History:  Procedure Laterality Date  . ANTERIOR CRUCIATE LIGAMENT REPAIR Right 05/19/2018   Procedure: RIGHT KNEE ARTHROSCOPY WITH ANTERIOR CRUCIATE LIGAMENT (ACL) REPAIR and MEDIAL MENISECTOMY;  Surgeon: [name], MD;  Location: [facility];  Service: Orthopedics;  Laterality: Right;  . APPENDECTOMY    . KNEE ARTHROSCOPY WITH MEDIAL MENISECTOMY Right 05/29/2017   Procedure: KNEE ARTHROSCOPY WITH MEDIAL MENISECTOMY and lateral meniscectomy;  Surgeon: [name], MD;  Location: [facility];  Service: Orthopedics;  Laterality: Right;  . KNEE ARTHROSCOPY WITH MEDIAL MENISECTOMY Right 02/05/2018   Procedure: RIGHT KNEE ARTHROSCOPY WITH PARTIAL MEDIAL AND LATERAL MENISECTOMY;  Surgeon: [name], MD;  Location: [facility];  Service: Orthopedics;  Laterality: Right;    There were no  vitals filed for this visit.  Subjective Assessment - 08/03/18 0949    Subjective  Patient reports he had a good weeekend and got his new truck. He reports his knee is feeling a little better and he is only having about 3/10 pain today.    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  Yes    Pain Score  3     Pain Location  Knee    Pain Orientation  Right    Pain Descriptors / Indicators  Aching;Sore    Pain Type  Surgical pain    Pain Onset  More than a month ago    Pain Frequency  Intermittent    Aggravating Factors   prolonged activity    Pain Relieving Factors  ice, rest        [facility] Adult PT Treatment/Exercise - 08/03/18 0001      Exercises   Exercises  Knee/Hip      Knee/Hip Exercises: Aerobic   Elliptical  4 minute on Level 3 for warm up/to simulate jogging      Knee/Hip Exercises: Machines for Strengthening   Cybex Knee Flexion  Rt LE only 3 plates 2x 03JKK    Cybex Leg Press  [employer]: 2x15 reps, 2  plates (15XYV), Rt LE only    Other Machine  Biodex: isokinetic ROM (90-40) at 120, 90, 60 degrees/sec; 2x 10 reps flex/ext at each rate      Knee/Hip Exercises: Standing   Forward Lunges  10 reps;2 sets;Right   painful with Rt LE performing eccentric control   Forward Lunges Limitations  4" box    Step Down  Right;2 sets;15 reps;Hand Hold: 0;Step Height: 2"    Functional Squat  15 reps    Functional Squat Limitations  chair taps for squat    SLS  Rt LE SLS on airex, 2x 20 rpes 1 kg ball toss and rebounder    SLS with Vectors  SLS on Rt LE, on foam, 15 reps for 3 way cone tap    Gait Training  Sports cord resisted jogging, 2x 30' RT fwd/2x 30' RT bkwd    Other Standing Knee Exercises  Side step with Green TB, x 30' RT, tb at ankles        PT Education - 08/03/18 0952    Education Details  Edcuated on exercises  throughout an don squat form. Educated again on ice and elevation importance.     Person(s) Educated  Patient    Methods  Explanation    Comprehension  Verbalized understanding       PT Short Term Goals - 07/22/18 0951      PT SHORT TERM GOAL #1   Title  Patient will be independent with HEP, updated PRN based on stage in protocol, to promote ACL repair healing and stability of Rt knee to return to work activitites and PLOF.    Baseline  06/18/18:  Reports compliance with HEP daily    Status  Achieved      PT SHORT TERM GOAL #2   Title  Patient will achieve 0-115 Rt knee ROM up to WNL's or equal to Lt LE for improved mobility with gait and stairs and symmetrical mechanics.     Baseline  11/27: AROM 0-132deg, not symmetrical to LLE however    Status  Partially Met      PT SHORT TERM GOAL #3   Title  Patient will improve MMT by 1/2 grade for all limited groups to inidicate signifiacant improvement in functional LE strength.    Baseline  11/27: see MMT    Status  Partially Met      PT SHORT TERM GOAL #4   Title  Patient will have reduced edema of Rt knee and circumference of knee joints will be equal bil LE's to indicate reduced swelling ofr improve ROM, muscle activation, and decresaed pain.     Status  Achieved        PT Long Term Goals - 07/22/18 0951      PT LONG TERM GOAL #1   Title  Patient will improve MMT by 1 grade for all limited groups to inidicate signifiacant improvement in functional LE strength.    Baseline  11/27: see MMT    Time  6    Period  Weeks    Status  Partially Met      PT LONG TERM GOAL #2   Title  Patient will achieve full Rt knee ROM up to WNL's or equal to Lt LE for improved mobility with gait and stairs and symmetrical mechanics.     Baseline  11/27: 0-132deg    Time  6    Period  Weeks    Status  On-going  PT LONG TERM GOAL #3   Title  Patient will demonstrate safe squat mechanics to progress closed chain exercises within protocol while  protecting ACL repair to improve strenght and reudce risk for re-injury.    Time  6    Period  Weeks    Status  On-going        Plan - 08/03/18 0950    Clinical Impression Statement  Continued with plan to progress through rehab protocol. Patient will be 11 weeks s/p Rt ACL repair on 08/04/18. He had decreased pain today compared to last session and was able to perform lunges today with Rt LE. He continues to have difficulty with eccentric strength for Rt quad and could not perform Lunge with Lt foot forward today. He continued with SLS proprioception training and progressed resisted sports cords with slow running today. He will return to Dr. Aline Brochure on 12/18 and will benefit from re-assessment prior to that follow up visit. He will continue to benefit from skilled PT interventions to address impairments and progress through protocol.    Rehab Potential  Good    PT Frequency  3x / week    PT Duration  6 weeks    PT Treatment/Interventions  ADLs/Self Care Home Management;Aquatic Therapy;Cryotherapy;Electrical Stimulation;Gait training;Functional mobility Scientist, forensic;Therapeutic activities;Therapeutic exercise;Balance training;Neuromuscular re-education;Patient/family education;Manual techniques;Passive range of motion;Taping;Scar mobilization    PT Next Visit Plan  Follow ACL protocol per Dr. Aline Brochure, patient 11 weeks post-op 08/03/18.  Monitor pain throughout. Progress through protocol as tolerated. Continued proprioceptive training and eccentric quad strengthening on Rt LE.    PT Home Exercise Plan  Eval: heel slide, quad set, knee flexion standing.     Consulted and Agree with Plan of Care  Patient       Patient will benefit from skilled therapeutic intervention in order to improve the following deficits and impairments:  Abnormal gait, Decreased skin integrity, Pain, Decreased mobility, Decreased scar mobility, Decreased strength, Decreased range of motion, Decreased endurance,  Decreased activity tolerance, Decreased balance, Difficulty walking, Increased edema, Impaired flexibility  Visit Diagnosis: Acute pain of right knee  Muscle weakness (generalized)  Other abnormalities of gait and mobility     Problem List Patient Active Problem List   Diagnosis Date Noted  . S/P right knee arthroscopy 02/05/18 06/23/2017  . S/P ACL repair right 05/19/18   . Chondromalacia of lateral femoral condyle, right   . Chondromalacia, patella, right     Victor [name], PT, DPT Physical Therapist with [facility]  08/03/2018 10:33 AM    [place], [place], [address] Phone: [phone]   Fax:  [phone]  Name: Victor [name] MRN: [id number] Date of Birth: 07-Apr-1976

## 2018-08-05 ENCOUNTER — Ambulatory Visit (HOSPITAL_COMMUNITY): Payer: Medicaid Other

## 2018-08-05 ENCOUNTER — Telehealth (HOSPITAL_COMMUNITY): Payer: Self-pay | Admitting: Family Medicine

## 2018-08-05 NOTE — Telephone Encounter (Signed)
08/05/18  pt called to cx but no reason given

## 2018-08-07 ENCOUNTER — Encounter (HOSPITAL_COMMUNITY): Payer: Self-pay

## 2018-08-07 ENCOUNTER — Ambulatory Visit (HOSPITAL_COMMUNITY): Payer: Medicaid Other

## 2018-08-07 DIAGNOSIS — R2689 Other abnormalities of gait and mobility: Secondary | ICD-10-CM | POA: Diagnosis not present

## 2018-08-07 DIAGNOSIS — M25561 Pain in right knee: Secondary | ICD-10-CM

## 2018-08-07 DIAGNOSIS — M6281 Muscle weakness (generalized): Secondary | ICD-10-CM | POA: Diagnosis not present

## 2018-08-07 NOTE — Therapy (Signed)
[place], [place], [address] Phone: [phone]   Fax:  [phone]  Physical Therapy Treatment  Patient Details  Name: Victor [name] MRN: [id number] Date of Birth: 28-Jun-1976 Referring Provider (PT): Adonis Huguenin, MD   Encounter Date: 08/07/2018  PT End of Session - 08/07/18 1030    Visit Number  17    Number of Visits  33    Date for PT Re-Evaluation  08/17/18    Authorization Type  Medicaid [place]  12 visits approved 07/21/18 to 08/17/18    Authorization Time Period  06/09/18 - 07/24/18.  3 visits approved 10/17-10/23; 12 visits approved10/28-11/24; another 12 visits approved 07/21/18 to 08/17/18    Authorization - Visit Number  5    Authorization - Number of Visits  12    PT Start Time  [phone]   4' on elliptical, not included with charges   PT Stop Time  1032    PT Time Calculation (min)  43 min    Activity Tolerance  Patient tolerated treatment well    Behavior During Therapy  [facility] for tasks assessed/performed       Past Medical History:  Diagnosis Date  . GERD (gastroesophageal reflux disease)   . History of stomach ulcers   . Hypertension     Past Surgical History:  Procedure Laterality Date  . ANTERIOR CRUCIATE LIGAMENT REPAIR Right 05/19/2018   Procedure: RIGHT KNEE ARTHROSCOPY WITH ANTERIOR CRUCIATE LIGAMENT (ACL) REPAIR and MEDIAL MENISECTOMY;  Surgeon: [name], MD;  Location: [facility];  Service: Orthopedics;  Laterality: Right;  . APPENDECTOMY    . KNEE ARTHROSCOPY WITH MEDIAL MENISECTOMY Right 05/29/2017   Procedure: KNEE ARTHROSCOPY WITH MEDIAL MENISECTOMY and lateral meniscectomy;  Surgeon: [name], MD;  Location: [facility];  Service: Orthopedics;  Laterality: Right;  . KNEE ARTHROSCOPY WITH MEDIAL MENISECTOMY Right 02/05/2018   Procedure: RIGHT KNEE ARTHROSCOPY WITH PARTIAL MEDIAL AND LATERAL MENISECTOMY;  Surgeon: [name], MD;  Location: [facility];  Service:  Orthopedics;  Laterality: Right;    There were no vitals filed for this visit.  Subjective Assessment - 08/07/18 0953    Subjective  Pt stated increased catching and knee cap popping yesterday with sharp stabbing pain, pain scale 6/10 today.      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  Yes    Pain Score  6     Pain Location  Knee    Pain Orientation  Right    Pain Descriptors / Indicators  Sharp;Aching    Pain Type  Surgical pain    Pain Onset  More than a month ago    Pain Frequency  Intermittent    Aggravating Factors   prolonged activity    Pain Relieving Factors  ice, rest                       [facility] Adult PT Treatment/Exercise - 08/07/18 0001      Knee/Hip Exercises: Machines for Strengthening   Cybex Knee Flexion  Rt LE only 3 plates 2x 29MSX    Cybex Leg Press  [employer]: 2x15 reps, 2 plates (11BZM), Rt LE only    Other Machine  Biodex: isokinetic ROM (90-40) at 120, 90, 60 degrees/sec; 2x  10 reps flex/ext at each rate      Knee/Hip Exercises: Standing   Lateral Step Up  Right;2 sets;15 reps;Hand Hold: 0;Step Height: 6"    Step Down  Right;2 sets;15 reps;Hand Hold: 0;Step Height: 4"    Functional Squat  2 sets;10 reps    Functional Squat Limitations  chair taps for squat    Other Standing Knee Exercises  Side step with Green TB, x 30' RT, tb at ankles    Other Standing Knee Exercises  SLS RDL 2x 10 3plates      Knee/Hip Exercises: Supine   Bridges  Both;10 reps    Bridges Limitations  bridge walk out      Manual Therapy   Manual Therapy  Joint mobilization;Other (comment)    Manual therapy comments  completed seperate form all other skilled interventions    Joint Mobilization  Patella mobs all directions    Other Manual Therapy  ice massage to patella tendon               PT Short  Term Goals - 07/22/18 0951      PT SHORT TERM GOAL #1   Title  Patient will be independent with HEP, updated PRN based on stage in protocol, to promote ACL repair healing and stability of Rt knee to return to work activitites and PLOF.    Baseline  06/18/18:  Reports compliance with HEP daily    Status  Achieved      PT SHORT TERM GOAL #2   Title  Patient will achieve 0-115 Rt knee ROM up to WNL's or equal to Lt LE for improved mobility with gait and stairs and symmetrical mechanics.     Baseline  11/27: AROM 0-132deg, not symmetrical to LLE however    Status  Partially Met      PT SHORT TERM GOAL #3   Title  Patient will improve MMT by 1/2 grade for all limited groups to inidicate signifiacant improvement in functional LE strength.    Baseline  11/27: see MMT    Status  Partially Met      PT SHORT TERM GOAL #4   Title  Patient will have reduced edema of Rt knee and circumference of knee joints will be equal bil LE's to indicate reduced swelling ofr improve ROM, muscle activation, and decresaed pain.     Status  Achieved        PT Long Term Goals - 07/22/18 0951      PT LONG TERM GOAL #1   Title  Patient will improve MMT by 1 grade for all limited groups to inidicate signifiacant improvement in functional LE strength.    Baseline  11/27: see MMT    Time  6    Period  Weeks    Status  Partially Met      PT LONG TERM GOAL #2   Title  Patient will achieve full Rt knee ROM up to WNL's or equal to Lt LE for improved mobility with gait and stairs and symmetrical mechanics.     Baseline  11/27: 0-132deg    Time  6    Period  Weeks    Status  On-going      PT LONG TERM GOAL #3   Title  Patient will demonstrate safe squat mechanics to progress closed chain exercises within protocol while protecting ACL repair to improve strenght and reudce risk for re-injury.    Time  6    Period  Weeks  Status  On-going            Plan - 08/07/18 1132    Clinical Impression Statement   Pt 11 weeks post-op ACL reconstruction and continued therex focus per protocol.  Pt reports increased pain at entrance anterior knee.  Added ice massage to anterior patella tendon for pain control.  Increased therex focus wtih eccentric hamstring as well as quad strengthening.  Added SLS RDL and bridge walk out for eccentric hamstirng strneghtneing.  EOS no reports of increased pain.      Rehab Potential  Good    PT Frequency  3x / week    PT Duration  6 weeks    PT Treatment/Interventions  ADLs/Self Care Home Management;Aquatic Therapy;Cryotherapy;Electrical Stimulation;Gait training;Functional mobility Scientist, forensic;Therapeutic activities;Therapeutic exercise;Balance training;Neuromuscular re-education;Patient/family education;Manual techniques;Passive range of motion;Taping;Scar mobilization    PT Next Visit Plan  Reassess prior MD apt 08/12/2018.  Follow ACL protocol per Dr. Aline Brochure, patient 11 weeks post-op 08/03/18.  Monitor pain throughout. Progress through protocol as tolerated. Continued proprioceptive training and eccentric quad strengthening on Rt LE.    PT Home Exercise Plan  Eval: heel slide, quad set, knee flexion standing.        Patient will benefit from skilled therapeutic intervention in order to improve the following deficits and impairments:  Abnormal gait, Decreased skin integrity, Pain, Decreased mobility, Decreased scar mobility, Decreased strength, Decreased range of motion, Decreased endurance, Decreased activity tolerance, Decreased balance, Difficulty walking, Increased edema, Impaired flexibility  Visit Diagnosis: Acute pain of right knee  Other abnormalities of gait and mobility  Muscle weakness (generalized)     Problem List Patient Active Problem List   Diagnosis Date Noted  . S/P right knee arthroscopy 02/05/18 06/23/2017  . S/P ACL repair right 05/19/18   . Chondromalacia of lateral femoral condyle, right   . Chondromalacia, patella, right     Ihor Austin, LPTA; [place]  Aldona Lento 08/07/2018, 12:18 PM  [place] [address] [place], [place], [address] Phone: [phone]   Fax:  [phone]  Name: Victor [name] MRN: [id number] Date of Birth: 1976-05-17

## 2018-08-10 ENCOUNTER — Other Ambulatory Visit: Payer: Self-pay

## 2018-08-10 ENCOUNTER — Encounter (HOSPITAL_COMMUNITY): Payer: Self-pay

## 2018-08-10 ENCOUNTER — Ambulatory Visit (HOSPITAL_COMMUNITY): Payer: Medicaid Other

## 2018-08-10 DIAGNOSIS — M6281 Muscle weakness (generalized): Secondary | ICD-10-CM

## 2018-08-10 DIAGNOSIS — M25561 Pain in right knee: Secondary | ICD-10-CM | POA: Diagnosis not present

## 2018-08-10 DIAGNOSIS — R2689 Other abnormalities of gait and mobility: Secondary | ICD-10-CM

## 2018-08-10 NOTE — Therapy (Signed)
[facility] [address] [place], [place], [address] Phone: [phone]   Fax:  [phone]  Physical Therapy Treatment/Progress Note  Patient Details  Name: Victor [name] MRN: [id number] Date of Birth: 19-May-1976 Referring Provider (PT): Adonis Huguenin, MD   Encounter Date: 08/10/2018   Progress Note Reporting Period 07/22/18 to 08/10/18  See note below for Objective Data and Assessment of Progress/Goals.    PT End of Session - 08/10/18 1014    Visit Number  18    Number of Visits  33    Date for PT Re-Evaluation  08/17/18    Authorization Type  Medicaid [place]  12 visits approved 07/21/18 to 08/17/18    Authorization Time Period  06/09/18 - 07/24/18.  3 visits approved 10/17-10/23; 12 visits approved10/28-11/24; another 12 visits approved 07/21/18 to 08/17/18    Authorization - Visit Number  6    Authorization - Number of Visits  12    PT Start Time  0950    PT Stop Time  1030    PT Time Calculation (min)  40 min    Activity Tolerance  Patient tolerated treatment well    Behavior During Therapy  [facility] for tasks assessed/performed       Past Medical History:  Diagnosis Date  . GERD (gastroesophageal reflux disease)   . History of stomach ulcers   . Hypertension     Past Surgical History:  Procedure Laterality Date  . ANTERIOR CRUCIATE LIGAMENT REPAIR Right 05/19/2018   Procedure: RIGHT KNEE ARTHROSCOPY WITH ANTERIOR CRUCIATE LIGAMENT (ACL) REPAIR and MEDIAL MENISECTOMY;  Surgeon: [name], MD;  Location: [facility];  Service: Orthopedics;  Laterality: Right;  . APPENDECTOMY    . KNEE ARTHROSCOPY WITH MEDIAL MENISECTOMY Right 05/29/2017   Procedure: KNEE ARTHROSCOPY WITH MEDIAL MENISECTOMY and lateral meniscectomy;  Surgeon: [name], MD;  Location: [facility];  Service: Orthopedics;  Laterality: Right;  . KNEE ARTHROSCOPY WITH MEDIAL MENISECTOMY Right 02/05/2018   Procedure: RIGHT KNEE ARTHROSCOPY WITH PARTIAL  MEDIAL AND LATERAL MENISECTOMY;  Surgeon: [name], MD;  Location: [facility];  Service: Orthopedics;  Laterality: Right;    There were no vitals filed for this visit.  Subjective Assessment - 08/10/18 0952    Subjective  Patient reports he is doing his advanced HEP at least every other day. He has about 4/10 pain currently and reports he is hoping to be back at work soon but is leaving that up to Dr. Aline Brochure. He reports he is still having knee pain in the back of his knee and occasionally has popping and clicking and pain in the front of his knee that feels like it is going to lock.    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  Yes    Pain Score  4     Pain Location  Knee    Pain Orientation  Right    Pain Descriptors / Indicators  Aching    Pain Type  Surgical pain    Pain Onset  More than a month ago    Pain Frequency  Intermittent    Aggravating Factors   activity    Pain Relieving Factors  ice, rest         [facility] PT Assessment - 08/10/18 0001  Assessment   Medical Diagnosis  s/p ACL repair    Referring Provider (PT)  Adonis Huguenin, MD    Onset Date/Surgical Date  05/19/18    Hand Dominance  Right    Next MD Visit  08/12/18    Prior Therapy  none      Prior Function   Level of Independence  Independent      Cognition   Overall Cognitive Status  Within Functional Limits for tasks assessed      AROM   Right Knee Extension  0   0   Right Knee Flexion  120   was 95 at eval; still painful     Strength   Right Hip Flexion  5/5   was 4 at eval   Right Hip Extension  4+/5   was 4+ at eval   Right Hip ABduction  4+/5   was 4 at eval   Left Hip Flexion  5/5    Left Hip Extension  5/5    Left Hip ABduction  5/5    Right Knee Flexion  4/5   painful (was 4+ on 11/27)   Right Knee Extension  4+/5    was 3+ at eval   Left Knee Flexion  5/5    Left Knee Extension  5/5    Right Ankle Dorsiflexion  5/5    Left Ankle Dorsiflexion  5/5        [facility] Adult PT Treatment/Exercise - 08/10/18 0001      Exercises   Exercises  Knee/Hip      Knee/Hip Exercises: Aerobic   Elliptical  4 minute on Level 3 for warm up/to simulate jogging      Knee/Hip Exercises: Machines for Strengthening   Cybex Leg Press  [employer]craft: 2x15 reps, 2 plates (38TRR), Rt LE only      Knee/Hip Exercises: Standing   Functional Squat  2 sets;10 reps    Functional Squat Limitations  chair taps for squat   with 2" foam for increased elevation   SLS  Rt LE SLS on airex, 2x 20 rpes 1 kg ball toss and rebounder      Knee/Hip Exercises: Supine   Short Arc Quad Sets  AROM;Strengthening;Right;2 sets;10 reps      Manual Therapy   Manual Therapy  Joint mobilization    Manual therapy comments  completed seperate form all other skilled interventions    Joint Mobilization  Rt patellofemoral joint mobs, grade III, sup/inf/med/lat; 3x 30-45 seconds       PT Education - 08/10/18 1032    Education Details  Educated on ongoing lack of ROM and increased weakness of Rt hamstring. Discussed that this could be due to aggravating hamstring with strengthening. Encouraged patient to discuss recent stumble on stairs with MD and the recent incresae in clicking/popping while walking. Discussed need for further improvement in squat form prior to returning to work for safety.    Person(s) Educated  Patient    Methods  Explanation    Comprehension  Verbalized understanding       PT Short Term Goals - 08/10/18 1014      PT SHORT TERM GOAL #1   Title  Patient will be independent with HEP, updated PRN based on stage in protocol, to promote ACL repair healing and stability of Rt knee to return to work activitites and PLOF.    Baseline  06/18/18:  Reports compliance with HEP daily    Status  Achieved      PT  SHORT TERM GOAL #2   Title   Patient will achieve 0-115 Rt knee ROM up to WNL's or equal to Lt LE for improved mobility with gait and stairs and symmetrical mechanics.     Baseline  0-120    Status  Achieved      PT SHORT TERM GOAL #3   Title  Patient will improve MMT by 1/2 grade for all limited groups to inidicate signifiacant improvement in functional LE strength.    Baseline  --    Status  Partially Met      PT SHORT TERM GOAL #4   Title  Patient will have reduced edema of Rt knee and circumference of knee joints will be equal bil LE's to indicate reduced swelling ofr improve ROM, muscle activation, and decresaed pain.     Status  Achieved        PT Long Term Goals - 08/10/18 1231      PT LONG TERM GOAL #1   Title  Patient will improve MMT by 1 grade for all limited groups to inidicate signifiacant improvement in functional LE strength.    Baseline  11/27: see MMT    Time  6    Period  Weeks    Status  On-going      PT LONG TERM GOAL #2   Title  Patient will achieve full Rt knee ROM up to WNL's or equal to Lt LE for improved mobility with gait and stairs and symmetrical mechanics.     Time  6    Period  Weeks    Status  On-going      PT LONG TERM GOAL #3   Title  Patient will demonstrate safe squat mechanics to progress closed chain exercises within protocol while protecting ACL repair to improve strenght and reudce risk for re-injury.    Time  6    Period  Weeks    Status  On-going         Plan - 08/10/18 1014    Clinical Impression Statement  Re-assessment performed today and patient continues to be limited at 120 degrees for Rt knee flexion and has ongoing pain in anterior and posterior Rt knee that began ~ 1-2 weeks ago. He had decrease in Rt hamstring strength due to increased pain compared to last re-assessment on 07/22/18 and has reported this week his knee has felt like it is going to lock up on him while he is walking. He continues to requires verbal/tactile cues to perform squats with  proper mechanics and will need to improve form to improve safety with return to work. He has a re-assessment with Dr. Aline Brochure on 08/12/18 and I have encouraged him to discuss his concerns for the knee pain and clicking noises with him. He will benefit from ongoing skilled PT interventions to address impairments and progress safely through rehab protocol to reduce re-injury risk with return to work as his job is demanding requiring heavy lifting repeatedly.     Rehab Potential  Good    PT Frequency  3x / week    PT Duration  6 weeks    PT Treatment/Interventions  ADLs/Self Care Home Management;Aquatic Therapy;Cryotherapy;Electrical Stimulation;Gait training;Functional mobility Scientist, forensic;Therapeutic activities;Therapeutic exercise;Balance training;Neuromuscular re-education;Patient/family education;Manual techniques;Passive range of motion;Taping;Scar mobilization    PT Next Visit Plan  Follow ACL protocol per Dr. Aline Brochure, patient 12 weeks post-op 08/10/18.  Monitor pain throughout. Progress through protocol as tolerated. Continue proprioceptive training and eccentric quad strengthening on Rt LE.  PT Home Exercise Plan  Eval: heel slide, quad set, knee flexion standing.     Consulted and Agree with Plan of Care  Patient       Patient will benefit from skilled therapeutic intervention in order to improve the following deficits and impairments:  Abnormal gait, Decreased skin integrity, Pain, Decreased mobility, Decreased scar mobility, Decreased strength, Decreased range of motion, Decreased endurance, Decreased activity tolerance, Decreased balance, Difficulty walking, Increased edema, Impaired flexibility  Visit Diagnosis: Acute pain of right knee  Other abnormalities of gait and mobility  Muscle weakness (generalized)     Problem List Patient Active Problem List   Diagnosis Date Noted  . S/P right knee arthroscopy 02/05/18 06/23/2017  . S/P ACL repair right 05/19/18   .  Chondromalacia of lateral femoral condyle, right   . Chondromalacia, patella, right     Victor [name], PT, DPT Physical Therapist with [facility]  08/10/2018 12:32 PM    [facility] [address] [place], [place], [address] Phone: [phone]   Fax:  [phone]  Name: Victor [name] MRN: [id number] Date of Birth: 1975-12-13

## 2018-08-12 ENCOUNTER — Encounter: Payer: Self-pay | Admitting: Orthopedic Surgery

## 2018-08-12 ENCOUNTER — Encounter (HOSPITAL_COMMUNITY): Payer: Self-pay

## 2018-08-12 ENCOUNTER — Ambulatory Visit (INDEPENDENT_AMBULATORY_CARE_PROVIDER_SITE_OTHER): Payer: Medicaid Other | Admitting: Orthopedic Surgery

## 2018-08-12 ENCOUNTER — Ambulatory Visit (HOSPITAL_COMMUNITY): Payer: Medicaid Other

## 2018-08-12 VITALS — BP 149/98 | HR 96 | Ht 71.0 in | Wt 242.6 lb

## 2018-08-12 DIAGNOSIS — M25561 Pain in right knee: Secondary | ICD-10-CM

## 2018-08-12 DIAGNOSIS — R2689 Other abnormalities of gait and mobility: Secondary | ICD-10-CM | POA: Diagnosis not present

## 2018-08-12 DIAGNOSIS — M6281 Muscle weakness (generalized): Secondary | ICD-10-CM

## 2018-08-12 DIAGNOSIS — Z9889 Other specified postprocedural states: Secondary | ICD-10-CM

## 2018-08-12 NOTE — Patient Instructions (Signed)
PRONE HANGS 3 X A DAY   OOW X 1 MONTH

## 2018-08-12 NOTE — Progress Notes (Signed)
Chief Complaint  Patient presents with  . Follow-up    Post op ACL 05/19/18    Postop day 85 which means he still in the postop period global.  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  . S/P ACL repair right 05/19/18 Yes

## 2018-08-12 NOTE — Therapy (Signed)
[facility] [place], [place], [address] Phone: [phone]   Fax:  [phone]  Physical Therapy Treatment  Patient Details  Name: Victor [name] MRN: [id number] Date of Birth: 07-18-1976 Referring Provider (PT): Adonis Huguenin, MD   Encounter Date: 08/12/2018  PT End of Session - 08/12/18 1038    Visit Number  19    Number of Visits  33    Date for PT Re-Evaluation  08/17/18    Authorization Type  Medicaid [place]  12 visits approved 07/21/18 to 08/17/18    Authorization Time Period  06/09/18 - 07/24/18.  3 visits approved 10/17-10/23; 12 visits approved10/28-11/24; another 12 visits approved 07/21/18 to 08/17/18    Authorization - Visit Number  7    Authorization - Number of Visits  12    PT Start Time  1034   4' on elliptical, not included in charges   PT Stop Time  1116    PT Time Calculation (min)  42 min    Activity Tolerance  Patient tolerated treatment well;Patient limited by pain   posterior knee/hamstring pain   Behavior During Therapy  [facility] for tasks assessed/performed       Past Medical History:  Diagnosis Date  . GERD (gastroesophageal reflux disease)   . History of stomach ulcers   . Hypertension     Past Surgical History:  Procedure Laterality Date  . ANTERIOR CRUCIATE LIGAMENT REPAIR Right 05/19/2018   Procedure: RIGHT KNEE ARTHROSCOPY WITH ANTERIOR CRUCIATE LIGAMENT (ACL) REPAIR and MEDIAL MENISECTOMY;  Surgeon: [name], MD;  Location: [facility];  Service: Orthopedics;  Laterality: Right;  . APPENDECTOMY    . KNEE ARTHROSCOPY WITH MEDIAL MENISECTOMY Right 05/29/2017   Procedure: KNEE ARTHROSCOPY WITH MEDIAL MENISECTOMY and lateral meniscectomy;  Surgeon: [name], MD;  Location: [facility];  Service: Orthopedics;  Laterality: Right;  . KNEE ARTHROSCOPY WITH MEDIAL MENISECTOMY Right 02/05/2018   Procedure: RIGHT KNEE ARTHROSCOPY WITH PARTIAL MEDIAL AND LATERAL MENISECTOMY;  Surgeon:  [name], MD;  Location: [facility];  Service: Orthopedics;  Laterality: Right;    There were no vitals filed for this visit.  Subjective Assessment - 08/12/18 1036    Subjective  Pt stated he has sore, achey, sharp pain in knee today, pain scale 5-6/10.  Stated he feels a pull in back of knee and continues to swell, even though has been applying ice to knee 3x a day.      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  Yes    Pain Score  6     Pain Location  Knee    Pain Orientation  Right    Pain Descriptors / Indicators  Aching;Sore;Sharp    Pain Type  Surgical pain    Pain Onset  More than a month ago    Pain Frequency  Intermittent    Aggravating Factors   activity    Pain Relieving Factors  ice, rest                       [facility] Adult PT Treatment/Exercise - 08/12/18 0001      Knee/Hip Exercises: Stretches   Active Hamstring Stretch  3 reps;30 seconds    Active Hamstring Stretch Limitations  12in step  height      Knee/Hip Exercises: Aerobic   Elliptical  4 minute on Level 3 for warm up/to simulate jogging      Knee/Hip Exercises: Machines for Strengthening   Cybex Knee Flexion  Rt LE only 3 plates 2x 02IOX; limited by pain unable to complete    Cybex Leg Press  [employer]craft: 2x15 reps, 2 plates (73ZHG), Rt LE only      Knee/Hip Exercises: Standing   Functional Squat  2 sets;10 reps    Functional Squat Limitations  chair taps for squat    Stairs  7in reciprocal pattern, noted excessive ER     SLS  Rt LE SLS on airex, 2x 20 rpes 1 kg ball toss and rebounder    Walking with Sports Cord  Sports cord forward and back x 2RT, lateral 2RT    Other Standing Knee Exercises  SLS RDL 2x 10 3plates      Manual Therapy   Manual Therapy  Edema management;Soft tissue mobilization    Manual therapy comments   completed seperate form all other skilled interventions    Edema Management  retro massage Rt knee    Soft tissue mobilization  distal quad and hamstring               PT Short Term Goals - 08/10/18 1014      PT SHORT TERM GOAL #1   Title  Patient will be independent with HEP, updated PRN based on stage in protocol, to promote ACL repair healing and stability of Rt knee to return to work activitites and PLOF.    Baseline  06/18/18:  Reports compliance with HEP daily    Status  Achieved      PT SHORT TERM GOAL #2   Title  Patient will achieve 0-115 Rt knee ROM up to WNL's or equal to Lt LE for improved mobility with gait and stairs and symmetrical mechanics.     Baseline  0-120    Status  Achieved      PT SHORT TERM GOAL #3   Title  Patient will improve MMT by 1/2 grade for all limited groups to inidicate signifiacant improvement in functional LE strength.    Baseline  --    Status  Partially Met      PT SHORT TERM GOAL #4   Title  Patient will have reduced edema of Rt knee and circumference of knee joints will be equal bil LE's to indicate reduced swelling ofr improve ROM, muscle activation, and decresaed pain.     Status  Achieved        PT Long Term Goals - 08/10/18 1231      PT LONG TERM GOAL #1   Title  Patient will improve MMT by 1 grade for all limited groups to inidicate signifiacant improvement in functional LE strength.    Baseline  11/27: see MMT    Time  6    Period  Weeks    Status  On-going      PT LONG TERM GOAL #2   Title  Patient will achieve full Rt knee ROM up to WNL's or equal to Lt LE for improved mobility with gait and stairs and symmetrical mechanics.     Time  6    Period  Weeks    Status  On-going      PT LONG TERM GOAL #3   Title  Patient will demonstrate safe squat mechanics to progress closed chain exercises within protocol while protecting  ACL repair to improve strenght and reudce risk for re-injury.    Time  6    Period  Weeks     Status  On-going            Plan - 08/12/18 1115    Clinical Impression Statement  Pt arrived with antalgic gait mechancics, reports of increased edema proximal knee and pain medial aspect posterior knee.  Noted increased tightness semitendinosus and pain with palpation.  Added manual retrograde massage and hamstring stretches to address edema and overall tightness.  Therex focus per ACL protocol at 12 weeks post-op.  Minimal additional exercises complete due to pain this session.  Did add lateral step with sports cord for glut med strengthening.  Encouraged patient to discuss recent stumble on stairs with MD and the recent incresae in clicking/popping while walking.    Rehab Potential  Good    PT Duration  6 weeks    PT Treatment/Interventions  ADLs/Self Care Home Management;Aquatic Therapy;Cryotherapy;Electrical Stimulation;Gait training;Functional mobility Scientist, forensic;Therapeutic activities;Therapeutic exercise;Balance training;Neuromuscular re-education;Patient/family education;Manual techniques;Passive range of motion;Taping;Scar mobilization    PT Next Visit Plan  Follow ACL protocol per Dr. Aline Brochure, patient 12 weeks post-op 08/10/18.  Monitor pain throughout. Progress through protocol as tolerated. Continue proprioceptive training and eccentric quad strengthening on Rt LE.     PT Home Exercise Plan  Eval: heel slide, quad set, knee flexion standing.        Patient will benefit from skilled therapeutic intervention in order to improve the following deficits and impairments:  Abnormal gait, Decreased skin integrity, Pain, Decreased mobility, Decreased scar mobility, Decreased strength, Decreased range of motion, Decreased endurance, Decreased activity tolerance, Decreased balance, Difficulty walking, Increased edema, Impaired flexibility  Visit Diagnosis: Acute pain of right knee  Other abnormalities of gait and mobility  Muscle weakness (generalized)     Problem  List Patient Active Problem List   Diagnosis Date Noted  . S/P right knee arthroscopy 02/05/18 06/23/2017  . S/P ACL repair right 05/19/18   . Chondromalacia of lateral femoral condyle, right   . Chondromalacia, patella, right    Ihor Austin, LPTA; [place]  Aldona Lento 08/12/2018, 11:32 AM  [facility] [address] [place], [place], [address] Phone: [phone]   Fax:  [phone]  Name: Victor [name] MRN: [id number] Date of Birth: [birth year]

## 2018-08-14 ENCOUNTER — Ambulatory Visit (HOSPITAL_COMMUNITY): Payer: Medicaid Other

## 2018-08-14 ENCOUNTER — Telehealth (HOSPITAL_COMMUNITY): Payer: Self-pay

## 2018-08-14 NOTE — Telephone Encounter (Signed)
No show, called and left message concerning missed apt today.  Reminded next apt date and time with contact infomation given.   [address], [place]; [facility] [phone]

## 2018-08-17 ENCOUNTER — Ambulatory Visit (HOSPITAL_COMMUNITY): Payer: Medicaid Other

## 2018-08-17 ENCOUNTER — Telehealth (HOSPITAL_COMMUNITY): Payer: Self-pay

## 2018-08-17 NOTE — Telephone Encounter (Signed)
No Show #2: I called Mr. Leever at his home phone number to inform him he missed his appointment for therapy at 9:45AM today. I informed him it is his second no show and that if he does not call to cancel or reschedule his next appointment that will be his third no show and he will be discharged from therapy. I informed him his next scheduled appointment is tomorrow 08/18/18 at 9:45 am and provided our front office number for him to call and cancel if he needs to.  Kipp Brood, PT, DPT Physical Therapist with [facility]  08/17/2018 10:19 AM

## 2018-08-18 ENCOUNTER — Telehealth (HOSPITAL_COMMUNITY): Payer: Self-pay

## 2018-08-18 ENCOUNTER — Ambulatory Visit (HOSPITAL_COMMUNITY): Payer: Medicaid Other

## 2018-08-18 NOTE — Telephone Encounter (Signed)
No show #3.  Called and left message concerning missed apt and D/C per no show policy.  Included our contact information if questions and encouraged to return to MD if wishes to resume therapy.  All further apts. cancelled.    [address] [place]; [facility] [phone]

## 2018-08-20 ENCOUNTER — Encounter (HOSPITAL_COMMUNITY): Payer: Self-pay

## 2018-08-20 NOTE — Therapy (Signed)
[facility] [place], [place], [address] Phone: [phone]   Fax:  [phone]  Patient Details  Name: Victor [name] MRN: [id number] Date of Birth: April 06, 1976 Referring Provider:  No ref. provider found  Encounter Date: 08/20/2018  PHYSICAL THERAPY DISCHARGE SUMMARY  Visits from Start of Care: 19  Current functional level related to goals / functional outcomes: Patient has "no-showed" his last 3 consecutive physical therapy appointments. He has been called each time and provided a reminder of the next appointment and the office phone number to be able to cancel if needed. Per office policy he will be discharged from this episode of therapy and will be required to obtain new referral if he wishes to return to rehab. He had been making good progress in physical therapy but remained limited at 120 for Rt knee flexion. He followed up on 08/12/18 with his surgeons office and has not returned since. He will be discharged from this episode of physical therapy.   Remaining deficits: See details from last progress note on 08/10/18. Below goal status at progress note.  PT Short Term Goals - 08/10/18 1014            PT SHORT TERM GOAL #1   Title  Patient will be independent with HEP, updated PRN based on stage in protocol, to promote ACL repair healing and stability of Rt knee to return to work activitites and PLOF.    Baseline  06/18/18:  Reports compliance with HEP daily    Status  Achieved        PT SHORT TERM GOAL #2   Title  Patient will achieve 0-115 Rt knee ROM up to WNL's or equal to Lt LE for improved mobility with gait and stairs and symmetrical mechanics.     Baseline  0-120    Status  Achieved        PT SHORT TERM GOAL #3   Title  Patient will improve MMT by 1/2 grade for all limited groups to inidicate signifiacant improvement in functional LE strength.    Baseline  --    Status  Partially Met        PT  SHORT TERM GOAL #4   Title  Patient will have reduced edema of Rt knee and circumference of knee joints will be equal bil LE's to indicate reduced swelling ofr improve ROM, muscle activation, and decresaed pain.     Status  Achieved           PT Long Term Goals - 08/10/18 1231            PT LONG TERM GOAL #1   Title  Patient will improve MMT by 1 grade for all limited groups to inidicate signifiacant improvement in functional LE strength.    Baseline  11/27: see MMT    Time  6    Period  Weeks    Status  On-going        PT LONG TERM GOAL #2   Title  Patient will achieve full Rt knee ROM up to WNL's or equal to Lt LE for improved mobility with gait and stairs and symmetrical mechanics.     Time  6    Period  Weeks    Status  On-going        PT LONG TERM GOAL #3   Title  Patient will demonstrate safe squat mechanics to progress closed chain exercises within protocol while protecting ACL repair to improve  strenght and reudce risk for re-injury.    Time  6    Period  Weeks    Status  Water quality scientist / Equipment: Educated on progress towards goals and on importance of HEP participation.    Plan: Patient agrees to discharge.  Patient goals were partially met. Patient is being discharged due to not returning since the last visit.  ?????      Kipp Brood, PT, DPT Physical Therapist with [facility]  08/20/2018 9:26 AM    [facility] [place], [place], [address] Phone: [phone]   Fax:  [phone]

## 2018-08-24 ENCOUNTER — Ambulatory Visit (HOSPITAL_COMMUNITY): Payer: Medicaid Other

## 2018-08-24 ENCOUNTER — Encounter: Payer: Self-pay | Admitting: Orthopedic Surgery

## 2018-09-14 ENCOUNTER — Ambulatory Visit: Payer: Medicaid Other | Admitting: Orthopedic Surgery

## 2018-09-23 ENCOUNTER — Ambulatory Visit: Payer: Medicaid Other | Admitting: Orthopedic Surgery

## 2018-10-05 ENCOUNTER — Encounter: Payer: Self-pay | Admitting: Orthopedic Surgery

## 2018-10-05 ENCOUNTER — Ambulatory Visit: Payer: Medicaid Other | Admitting: Orthopedic Surgery

## 2018-10-05 VITALS — BP 139/85 | HR 89 | Ht 71.0 in | Wt 252.0 lb

## 2018-10-05 DIAGNOSIS — Z9889 Other specified postprocedural states: Secondary | ICD-10-CM

## 2018-10-05 NOTE — Progress Notes (Signed)
Chief Complaint  Patient presents with  . Routine Post Op    Rt DOS 05/19/18    5 months after ACL reconstruction right knee feels good  Lockman test is excellent drawer test is normal range of motion is returned to normal including extension  I think he can go back to work with a economy hinged brace  Encounter Diagnosis  Name Primary?  . S/P ACL repair right 05/19/18 Yes    I released him today with a brace for work

## 2018-10-05 NOTE — Patient Instructions (Signed)
Wear brace for heavy activity and work  Cecere return to work on

## 2018-11-10 DIAGNOSIS — G8911 Acute pain due to trauma: Secondary | ICD-10-CM | POA: Diagnosis not present

## 2018-11-10 DIAGNOSIS — S6992XA Unspecified injury of left wrist, hand and finger(s), initial encounter: Secondary | ICD-10-CM | POA: Diagnosis not present

## 2018-11-10 DIAGNOSIS — M79642 Pain in left hand: Secondary | ICD-10-CM | POA: Diagnosis not present

## 2018-11-10 DIAGNOSIS — S6722XA Crushing injury of left hand, initial encounter: Secondary | ICD-10-CM | POA: Diagnosis not present

## 2019-02-16 ENCOUNTER — Emergency Department (HOSPITAL_COMMUNITY)
Admission: EM | Admit: 2019-02-16 | Discharge: 2019-02-16 | Disposition: A | Discharge status: Home / Self Care | Payer: Medicaid Other | Attending: Emergency Medicine | Admitting: Emergency Medicine

## 2019-02-16 ENCOUNTER — Emergency Department (HOSPITAL_COMMUNITY): Payer: Medicaid Other

## 2019-02-16 ENCOUNTER — Encounter (HOSPITAL_COMMUNITY): Payer: Self-pay

## 2019-02-16 ENCOUNTER — Other Ambulatory Visit: Payer: Self-pay

## 2019-02-16 DIAGNOSIS — R200 Anesthesia of skin: Secondary | ICD-10-CM | POA: Diagnosis not present

## 2019-02-16 DIAGNOSIS — F17210 Nicotine dependence, cigarettes, uncomplicated: Secondary | ICD-10-CM | POA: Diagnosis not present

## 2019-02-16 DIAGNOSIS — R110 Nausea: Secondary | ICD-10-CM | POA: Insufficient documentation

## 2019-02-16 DIAGNOSIS — I10 Essential (primary) hypertension: Secondary | ICD-10-CM | POA: Insufficient documentation

## 2019-02-16 DIAGNOSIS — I959 Hypotension, unspecified: Secondary | ICD-10-CM | POA: Diagnosis not present

## 2019-02-16 DIAGNOSIS — R0789 Other chest pain: Secondary | ICD-10-CM

## 2019-02-16 DIAGNOSIS — Z20828 Contact with and (suspected) exposure to other viral communicable diseases: Secondary | ICD-10-CM | POA: Diagnosis not present

## 2019-02-16 DIAGNOSIS — R42 Dizziness and giddiness: Secondary | ICD-10-CM | POA: Diagnosis not present

## 2019-02-16 DIAGNOSIS — R079 Chest pain, unspecified: Secondary | ICD-10-CM | POA: Diagnosis not present

## 2019-02-16 DIAGNOSIS — R000 Tachycardia, unspecified: Secondary | ICD-10-CM | POA: Diagnosis not present

## 2019-02-16 DIAGNOSIS — R0689 Other abnormalities of breathing: Secondary | ICD-10-CM | POA: Diagnosis not present

## 2019-02-16 DIAGNOSIS — K219 Gastro-esophageal reflux disease without esophagitis: Secondary | ICD-10-CM | POA: Insufficient documentation

## 2019-02-16 DIAGNOSIS — Z209 Contact with and (suspected) exposure to unspecified communicable disease: Secondary | ICD-10-CM | POA: Diagnosis not present

## 2019-02-16 LAB — BASIC METABOLIC PANEL WITH GFR
Anion gap: 9 (ref 5–15)
BUN: 15 mg/dL (ref 6–20)
CO2: 26 mmol/L (ref 22–32)
Calcium: 8.9 mg/dL (ref 8.9–10.3)
Chloride: 102 mmol/L (ref 98–111)
Creatinine, Ser: 0.94 mg/dL (ref 0.61–1.24)
GFR calc Af Amer: >60 mL/min
GFR calc non Af Amer: >60 mL/min
Glucose, Bld: 99 mg/dL (ref 70–99)
Potassium: 4.2 mmol/L (ref 3.5–5.1)
Sodium: 137 mmol/L (ref 135–145)

## 2019-02-16 LAB — CBC
HCT: 45.9 % (ref 39.0–52.0)
Hemoglobin: 15.2 g/dL (ref 13.0–17.0)
MCH: 29.8 pg (ref 26.0–34.0)
MCHC: 33.1 g/dL (ref 30.0–36.0)
MCV: 90 fL (ref 80.0–100.0)
Platelets: 269 10*3/uL (ref 150–400)
RBC: 5.1 MIL/uL (ref 4.22–5.81)
RDW: 12.9 % (ref 11.5–15.5)
WBC: 11.9 10*3/uL — ABNORMAL HIGH (ref 4.0–10.5)
nRBC: 0 % (ref 0.0–0.2)

## 2019-02-16 LAB — TROPONIN I (HIGH SENSITIVITY)
Troponin I (High Sensitivity): 5 ng/L
Troponin I (High Sensitivity): 5 ng/L (ref <18)

## 2019-02-16 LAB — SARS CORONAVIRUS 2 BY RT PCR (HOSPITAL ORDER, PERFORMED IN ~~LOC~~ HOSPITAL LAB): SARS Coronavirus 2: NEGATIVE

## 2019-02-16 MED ORDER — MORPHINE SULFATE (PF) 4 MG/ML IV SOLN
4.0000 mg | Freq: Once | INTRAVENOUS | Status: AC
  Administered 2019-02-16: 4 mg via INTRAVENOUS
  Filled 2019-02-16: qty 1

## 2019-02-16 MED ORDER — NITROGLYCERIN 0.4 MG SL SUBL
0.4000 mg | SUBLINGUAL_TABLET | Freq: Once | SUBLINGUAL | Status: AC
  Administered 2019-02-16: 0.4 mg via SUBLINGUAL
  Filled 2019-02-16: qty 1

## 2019-02-16 MED ORDER — AMLODIPINE BESYLATE 5 MG PO TABS: 5.0000 mg | ORAL_TABLET | Freq: Every day | ORAL | 1 refills | Status: DC

## 2019-02-16 MED ORDER — SODIUM CHLORIDE 0.9% FLUSH
3.0000 mL | Freq: Once | INTRAVENOUS | Status: AC
  Administered 2019-02-16: 14:00:00 3 mL via INTRAVENOUS

## 2019-02-16 MED ORDER — SODIUM CHLORIDE 0.9 % IV BOLUS
500.0000 mL | Freq: Once | INTRAVENOUS | Status: AC
  Administered 2019-02-16: 15:00:00 500 mL via INTRAVENOUS

## 2019-02-16 MED ORDER — AMLODIPINE BESYLATE 5 MG PO TABS
5.0000 mg | ORAL_TABLET | Freq: Once | ORAL | Status: AC
  Administered 2019-02-16: 5 mg via ORAL
  Filled 2019-02-16: qty 1

## 2019-02-16 NOTE — Discharge Instructions (Addendum)
Begin taking the amlodipine daily. Follow-up with cardiology as soon as possible, likely sometime this week.  Call the office to make an appointment. Return to the emergency department immediately should symptoms recur.

## 2019-02-16 NOTE — ED Triage Notes (Addendum)
Pt brought in by EMS due to CP and left arm numbness for 3 days. Initial BP 186/130 pt given nitro x 1 with decrease 146/40 per EMS. Also given 324 mg and 4mg  zofran. Pt reports not taking BP med for 8 months. Pt also reports neck pain

## 2019-02-16 NOTE — ED Provider Notes (Signed)
[facility] EMERGENCY DEPARTMENT Provider Note   CSN: [id number] Arrival date & time: 02/16/19  1207    History   Chief Complaint Chief Complaint  Patient presents with  . Chest Pain  . Numbness    HPI Victor [name] is a 43 y.o. male.  HPI: A 43 year old patient with a history of hypertension presents for evaluation of chest pain. Initial onset of pain was approximately 1-3 hours ago. The patient's chest pain is well-localized, is described as heaviness/pressure/tightness, is not worse with exertion and is relieved by nitroglycerin. The patient complains of nausea and reports some diaphoresis. The patient's chest pain is middle- or left-sided, is not sharp and does radiate to the arms/jaw/neck. The patient has smoked in the past 90 days and has a family history of coronary artery disease in a first-degree relative with onset less than age 105. The patient has no history of stroke, has no history of peripheral artery disease, denies any history of treated diabetes, has no history of hypercholesterolemia and does not have an elevated BMI (>=30).   HPI   Victor [name] is a 43 y.o. male, with a history of GERD and HTN, presenting to the ED with chest pain beginning around 10 AM this morning.  Patient was mowing grass on a riding lawnmower at the time of onset.  He is chest pain is central chest, tightness, initially 6-7/10, constant.  Accompanied by nausea, dizziness, diaphoresis, shortness of breath, and tingling in the left arm. He has had similar episodes intermittently over the last 2 to 3 days that usually last about 15 minutes and then resolve.  EMS reports initial BP 186/130.  Pain improved to 2/10 with a single dose of sublingual nitro.  Also treated with 324 mg ASA and 4 mg Zofran. He was previously on hypertension medication, but stopped taking it about 8 months ago because he did not like the way it made him feel.  Denies fever/chills, cough, current shortness of breath, abdominal  pain, back pain, weakness, vision loss, lower extremity pain/swelling, vomiting, diarrhea, syncope, or any other complaints.  Past Medical History:  Diagnosis Date  . GERD (gastroesophageal reflux disease)   . History of stomach ulcers   . Hypertension     Patient Active Problem List   Diagnosis Date Noted  . S/P right knee arthroscopy 02/05/18 06/23/2017  . S/P ACL repair right 05/19/18   . Chondromalacia of lateral femoral condyle, right   . Chondromalacia, patella, right     Past Surgical History:  Procedure Laterality Date  . ANTERIOR CRUCIATE LIGAMENT REPAIR Right 05/19/2018   Procedure: RIGHT KNEE ARTHROSCOPY WITH ANTERIOR CRUCIATE LIGAMENT (ACL) REPAIR and MEDIAL MENISECTOMY;  Surgeon: [name], MD;  Location: [facility];  Service: Orthopedics;  Laterality: Right;  . APPENDECTOMY    . KNEE ARTHROSCOPY WITH MEDIAL MENISECTOMY Right 05/29/2017   Procedure: KNEE ARTHROSCOPY WITH MEDIAL MENISECTOMY and lateral meniscectomy;  Surgeon: [name], MD;  Location: [facility];  Service: Orthopedics;  Laterality: Right;  . KNEE ARTHROSCOPY WITH MEDIAL MENISECTOMY Right 02/05/2018   Procedure: RIGHT KNEE ARTHROSCOPY WITH PARTIAL MEDIAL AND LATERAL MENISECTOMY;  Surgeon: [name], MD;  Location: [facility];  Service: Orthopedics;  Laterality: Right;        Home Medications    Prior to Admission medications   Medication Sig Start Date End Date Taking? Authorizing Provider  amLODipine (NORVASC) 5 MG tablet Take 1 tablet (5 mg total) by mouth daily. 02/16/19 04/17/19  Arlean Hopping  C, PA-C    Family History Family History  Problem Relation Age of Onset  . Diabetes Mother   . Heart failure Mother   . Cancer Mother   . Pancreatic cancer Mother   . Cancer Father   . Throat cancer Father   . Healthy Sister   . Head & neck cancer Brother   . Bone cancer Maternal Grandmother   . Diabetes Maternal Grandmother   . CAD Maternal Grandfather   . Brain cancer Maternal  Grandfather   . Colon cancer Paternal Grandfather     Social History Social History   Tobacco Use  . Smoking status: Current Every Day Smoker    Packs/day: 1.00    Years: 15.00    Pack years: 15.00    Types: Cigarettes    Start date: 07/07/1991    Last attempt to quit: 04/09/2018    Years since quitting: 0.8  . Smokeless tobacco: Current User    Types: Chew  . Tobacco comment: quit a couple months ago  Substance Use Topics  . Alcohol use: No  . Drug use: No     Allergies   Patient has no known allergies.   Review of Systems Review of Systems  Constitutional: Positive for diaphoresis. Negative for chills and fever.  Respiratory: Positive for shortness of breath. Negative for cough.   Cardiovascular: Positive for chest pain. Negative for leg swelling.  Gastrointestinal: Positive for nausea. Negative for abdominal pain and vomiting.  Musculoskeletal: Negative for back pain.  Neurological: Positive for dizziness and numbness. Negative for syncope, weakness and headaches.  All other systems reviewed and are negative.    Physical Exam Updated Vital Signs BP 123/85 (BP Location: Right Arm)   Pulse 95   Temp 98.2 F (36.8 C) (Oral)   Resp 13   Ht 5\' 11"  (1.803 m)   Wt 108.9 kg   SpO2 92%   BMI 33.47 kg/m   Physical Exam Vitals signs and nursing note reviewed.  Constitutional:      General: He is not in acute distress.    Appearance: He is well-developed. He is not diaphoretic.  HENT:     Head: Normocephalic and atraumatic.     Mouth/Throat:     Mouth: Mucous membranes are moist.     Pharynx: Oropharynx is clear.  Eyes:     Conjunctiva/sclera: Conjunctivae normal.  Neck:     Musculoskeletal: Normal range of motion and neck supple. No neck rigidity.     Comments: No additional symptom onset or change in symptoms with range of motion of the neck. Cardiovascular:     Rate and Rhythm: Normal rate and regular rhythm.     Pulses: Normal pulses.          Radial  pulses are 2+ on the right side and 2+ on the left side.       Posterior tibial pulses are 2+ on the right side and 2+ on the left side.     Heart sounds: Normal heart sounds.     Comments: Tactile temperature in the extremities appropriate and equal bilaterally. Pulmonary:     Effort: Pulmonary effort is normal. No respiratory distress.     Breath sounds: Normal breath sounds.  Abdominal:     Palpations: Abdomen is soft.     Tenderness: There is no abdominal tenderness. There is no guarding.  Musculoskeletal:     Right lower leg: No edema.     Left lower leg: No edema.  Comments: Patient has full range of motion in the left shoulder.  No change in symptoms with range of motion.  Lymphadenopathy:     Cervical: No cervical adenopathy.  Skin:    General: Skin is warm and dry.  Neurological:     Mental Status: He is alert.     Comments: Sensation grossly intact to light touch in the extremities.  Grip strengths equal bilaterally.  Strength 5/5 in all extremities. No gait disturbance. Coordination intact. Cranial nerves III-XII grossly intact. No facial droop.   Psychiatric:        Mood and Affect: Mood and affect normal.        Speech: Speech normal.        Behavior: Behavior normal.      ED Treatments / Results  Labs (all labs ordered are listed, but only abnormal results are displayed) Labs Reviewed  CBC - Abnormal; Notable for the following components:      Result Value   WBC 11.9 (*)    All other components within normal limits  SARS CORONAVIRUS 2 (HOSPITAL ORDER, [place] [facility])  BASIC METABOLIC PANEL  TROPONIN I (HIGH SENSITIVITY)  TROPONIN I (HIGH SENSITIVITY)    EKG EKG Interpretation  Date/Time:  Tuesday February 16 2019 12:18:32 EDT Ventricular Rate:  91 PR Interval:    QRS Duration: 88 QT Interval:  335 QTC Calculation: 413 R Axis:   71 Text Interpretation:  Sinus rhythm Confirmed by Fredia Sorrow [phone]) on 02/16/2019 12:34:24  PM   Radiology Dg Chest 2 View  Result Date: 02/16/2019 CLINICAL DATA:  Lt sided chest pain on and off x 3-4 days. Smoker. EXAM: CHEST - 2 VIEW COMPARISON:  05/07/2007 FINDINGS: Lungs are clear. Heart size and mediastinal contours are within normal limits. No effusion.  No pneumothorax. Visualized bones unremarkable. IMPRESSION: No acute cardiopulmonary disease. Electronically Signed   By: Lucrezia Europe M.D.   On: 02/16/2019 14:01    Procedures Procedures (including critical care time)  Medications Ordered in ED Medications  sodium chloride flush (NS) 0.9 % injection 3 mL (3 mLs Intravenous Given 02/16/19 1356)  nitroGLYCERIN (NITROSTAT) SL tablet 0.4 mg (0.4 mg Sublingual Given 02/16/19 1356)  morphine 4 MG/ML injection 4 mg (4 mg Intravenous Given 02/16/19 1512)  sodium chloride 0.9 % bolus 500 mL (0 mLs Intravenous Stopped 02/16/19 1636)  amLODipine (NORVASC) tablet 5 mg (5 mg Oral Given 02/16/19 1829)     Initial Impression / Assessment and Plan / ED Course  I have reviewed the triage vital signs and the nursing notes.  Pertinent labs & imaging results that were available during my care of the patient were reviewed by me and considered in my medical decision making (see chart for details).  Clinical Course as of Feb 16 1919  Tue Feb 16, 2019  1415 Patient states his pain has improved to 2/10.  The tingling in his left arm has also improved.   [SJ]  1500 Chest pain is now 1/10 and left arm tingling has resolved. No additional symptoms.    [SJ]  Lund with Dr. Bronson Ing, cardiologist.  Repeat troponin at three hour mark.  If this continues to be negative and symptoms do not recur, patient [place] be discharged.  Start patient on 5 mg amlodipine daily. If symptoms recur, admit patient for observation. Amlodipine 5mg  per day.    [SJ]  1703 Patient continues to be symptom-free.   [SJ]  1803 Patient continues to be symptom-free.   [  SJ]    Clinical Course User Index [SJ] Layla Maw    HEAR Score: 4  Patient presents with chest pain.  Pain improved with nitroglycerin.  Differential includes hypertensive urgency versus angina.  Upon arrival in the ED, Patient is nontoxic appearing, afebrile, not tachycardic, not tachypneic, not hypotensive, maintains excellent SPO2 on room air, and is in no apparent distress.  PERC negative. CXR doubt acute abnormality.  EKG without concerning abnormalities.  Delta troponins negative.  Patient symptoms resolved and did not recur.  Per cardiology recommendation, patient to have close follow-up in the office.  Strict return precautions discussed.  Patient voices understanding of these instructions, accepts the plan, and is comfortable with discharge.  Findings and plan of care discussed with Fredia Sorrow, MD.   Vitals:   02/16/19 1600 02/16/19 1630 02/16/19 1700 02/16/19 1730  BP: 131/84 114/78 129/80 133/85  Pulse: (!) 57 68 62 (!) 54  Resp: 14 17 12 17   Temp:      TempSrc:      SpO2: 96% 94% 98% 95%  Weight:      Height:       Vitals:   02/16/19 1700 02/16/19 1730 02/16/19 1830 02/16/19 1900  BP: 129/80 133/85 (!) 121/91 132/89  Pulse: 62 (!) 54 69 64  Resp: 12 17 15 15   Temp:      TempSrc:      SpO2: 98% 95% 98% 95%  Weight:      Height:         Final Clinical Impressions(s) / ED Diagnoses   Final diagnoses:  Chest tightness    ED Discharge Orders         Ordered    amLODipine (NORVASC) 5 MG tablet  Daily     02/16/19 1704           Layla Maw 02/16/19 Richarda Overlie, MD 02/21/19 1644

## 2019-02-17 ENCOUNTER — Other Ambulatory Visit: Payer: Self-pay

## 2019-02-18 ENCOUNTER — Encounter: Payer: Self-pay | Admitting: Family Medicine

## 2019-02-18 ENCOUNTER — Ambulatory Visit: Payer: Medicaid Other | Admitting: Family Medicine

## 2019-02-18 ENCOUNTER — Ambulatory Visit (INDEPENDENT_AMBULATORY_CARE_PROVIDER_SITE_OTHER): Payer: Medicaid Other

## 2019-02-18 VITALS — BP 134/89 | HR 83 | Temp 97.2°F | Ht 71.0 in | Wt 238.0 lb

## 2019-02-18 DIAGNOSIS — S199XXA Unspecified injury of neck, initial encounter: Secondary | ICD-10-CM

## 2019-02-18 DIAGNOSIS — R079 Chest pain, unspecified: Secondary | ICD-10-CM

## 2019-02-18 MED ORDER — PREDNISONE 10 MG (21) PO TBPK: ORAL_TABLET | ORAL | 0 refills | Status: DC

## 2019-02-18 MED ORDER — METHYLPREDNISOLONE ACETATE 80 MG/ML IJ SUSP
80.0000 mg | Freq: Once | INTRAMUSCULAR | Status: AC
  Administered 2019-02-18: 14:00:00 80 mg via INTRAMUSCULAR

## 2019-02-18 MED ORDER — TRAMADOL HCL 50 MG PO TABS: 50.0000 mg | ORAL_TABLET | Freq: Three times a day (TID) | ORAL | 0 refills | Status: AC | PRN

## 2019-02-18 MED ORDER — CYCLOBENZAPRINE HCL 10 MG PO TABS: 10.0000 mg | ORAL_TABLET | Freq: Three times a day (TID) | ORAL | 0 refills | Status: DC | PRN

## 2019-02-18 NOTE — Progress Notes (Signed)
Subjective: CC: Neck pain, hospital discharge follow-up PCP: [name], DO WER:[id number] Victor [name] is a 43 y.o. male presenting to clinic today for:  1.  Neck pain Patient reports that he sustained a neck injury about 2 weeks ago.  He was working with a piece of equipment which was suspended with a chain with a chain slipped and it subsequently hit the top of his head and pushed down.  He has had difficulty moving his neck left right up and down since.  He reports that the pain seems to be in the middle of the neck and does not radiate into the shoulders or upper back.  Denies any sensation changes or upper extremity weakness.  He has been using OTC analgesics with little improvement in symptoms.  He is also been applying heating pads to the affected area which do help a little but not substantially.  2.  Hospital follow-up Patient was seen in the emergency department 2 days ago for chest pain that radiated to the left upper extremity with associated tingling.  He was noted to have quite a bit of elevation in his blood pressure I was discharged home with Norvasc 5 mg to start.  Patient notes that since starting the medicine he does feel slightly fatigued but otherwise seems to be tolerating the medicine without difficulty.  He is asking for referral to cardiology for stress testing.  Of note EKG without evidence of ischemia.  Delta troponins negative.  Patient remains asymptomatic.   ROS: Per HPI  No Known Allergies Past Medical History:  Diagnosis Date  . GERD (gastroesophageal reflux disease)   . History of stomach ulcers   . Hypertension     Current Outpatient Medications:  .  amLODipine (NORVASC) 5 MG tablet, Take 1 tablet (5 mg total) by mouth daily., Disp: 30 tablet, Rfl: 1 Social History   Socioeconomic History  . Marital status: Married    Spouse name: Not on file  . Number of children: 3  . Years of education: Not on file  . Highest education level: Not on file   Occupational History  . Not on file  Social Needs  . Financial resource strain: Not on file  . Food insecurity    Worry: Not on file    Inability: Not on file  . Transportation needs    Medical: Not on file    Non-medical: Not on file  Tobacco Use  . Smoking status: Current Every Day Smoker    Packs/day: 1.00    Years: 15.00    Pack years: 15.00    Types: Cigarettes    Start date: 07/07/1991    Last attempt to quit: 04/09/2018    Years since quitting: 0.8  . Smokeless tobacco: Current User    Types: Chew  . Tobacco comment: quit a couple months ago  Substance and Sexual Activity  . Alcohol use: No  . Drug use: No  . Sexual activity: Yes  Lifestyle  . Physical activity    Days per week: Not on file    Minutes per session: Not on file  . Stress: Not on file  Relationships  . Social Herbalist on phone: Not on file    Gets together: Not on file    Attends religious service: Not on file    Active member of club or organization: Not on file    Attends meetings of clubs or organizations: Not on file    Relationship status: Not  on file  . Intimate partner violence    Fear of current or ex partner: Not on file    Emotionally abused: Not on file    Physically abused: Not on file    Forced sexual activity: Not on file  Other Topics Concern  . Not on file  Social History Narrative  . Not on file   Family History  Problem Relation Age of Onset  . Diabetes Mother   . Heart failure Mother   . Cancer Mother   . Pancreatic cancer Mother   . Cancer Father   . Throat cancer Father   . Healthy Sister   . Head & neck cancer Brother   . Bone cancer Maternal Grandmother   . Diabetes Maternal Grandmother   . CAD Maternal Grandfather   . Brain cancer Maternal Grandfather   . Colon cancer Paternal Grandfather     Objective: Office vital signs reviewed. BP 134/89   Pulse 83   Temp (!) 97.2 F (36.2 C) (Oral)   Ht 5\' 11"  (1.803 m)   Wt 238 lb (108 kg)   BMI  33.19 kg/m   Physical Examination:  General: Awake, alert, well nourished, No acute distress HEENT: Normal, sclera white, MMM; [place]/AT Neck: Limited active range of motion in extension, flexion and rotation.  He has mild midline tenderness to palpation towards the right side of his neck at about C5. Cardio: regular rate and rhythm, S1S2 heard, no murmurs appreciated Pulm: clear to auscultation bilaterally, no wheezes, rhonchi or rales; normal work of breathing on room air Extremities: warm, well perfused, No edema, cyanosis or clubbing; +2 pulses bilaterally MSK: Normal gait and normal station; he has full active range of motion of bilateral upper extremities. Skin: dry; intact; no rashes or lesions Neuro: light touch sensation in tact.  No results found.  Assessment/ Plan: 43 y.o. male   1. Injury of neck, initial encounter Physical exam with some midline/paraspinal tenderness to palpation on exam and around C5.  Therefore x-rays were obtained.  Personal review of x-rays demonstrated no evidence of displaced vertebral segments or vertebral fracture.  Will treat as cervical neck strain.  He was given a dose of Depo-Medrol here in office.  He was discharged home on prednisone Dosepak, muscle relaxer and Ultram.  We discussed sparing use of Ultram.  National narcotic database was reviewed and there were no red flags. - DG Cervical Spine Complete; Future - methylPREDNISolone acetate (DEPO-MEDROL) injection 80 mg - cyclobenzaprine (FLEXERIL) 10 MG tablet; Take 1 tablet (10 mg total) by mouth 3 (three) times daily as needed for muscle spasms.  Dispense: 30 tablet; Refill: 0 - predniSONE (STERAPRED UNI-PAK 21 TAB) 10 MG (21) TBPK tablet; As directed x 6 days  Dispense: 21 tablet; Refill: 0 - traMADol (ULTRAM) 50 MG tablet; Take 1 tablet (50 mg total) by mouth every 8 (eight) hours as needed for up to 5 days for severe pain.  Dispense: 15 tablet; Refill: 0  2. Chest pain, unspecified type I  reviewed his emergency department notes and results.  I placed a referral to cardiology for stress testing. - Ambulatory referral to Cardiology   Orders Placed This Encounter  Procedures  . DG Cervical Spine Complete    Standing Status:   Future    Standing Expiration Date:   04/19/2020    Order Specific Question:   Reason for Exam (SYMPTOM  OR DIAGNOSIS REQUIRED)    Answer:   neck injury 1 week ago  Order Specific Question:   Preferred imaging location?    Answer:   Internal    Order Specific Question:   Radiology Contrast Protocol - do NOT remove file path    Answer:   [email or web address]   No orders of the defined types were placed in this encounter.    [name], DO [place] [phone] '

## 2019-02-19 ENCOUNTER — Telehealth: Payer: Self-pay | Admitting: Cardiology

## 2019-02-19 NOTE — Telephone Encounter (Signed)
Pt voice understanding - will update medication list and pt will f/u on symptoms and BP

## 2019-02-19 NOTE — Telephone Encounter (Signed)
Pt says since he started Amlodipine L arm tingling and numb "feels like I have slept on it" denies any other symptoms and wanted to know if Amlodipine could be causing this - pt says he went to pcp yesterday and BP was 138/70

## 2019-02-19 NOTE — Telephone Encounter (Signed)
amLODipine (NORVASC) 5 MG tablet  Patient was started on medication and he has questions about it

## 2019-02-19 NOTE — Telephone Encounter (Signed)
    By review of the side-effect profile, this is usually in less than 1% of patients. Was this started by his PCP as he last saw Dr. Domenic Polite in 02/2018 and was not on the medication at that time? If new to his regimen and new symptoms, would recommend cutting the tablet in half and taking 2.5mg  daily to see if he notices any change in his symptoms. If his symptoms improve, then [place] need to consider an alternative medication. Continue to follow BP with dose adjustments.   Signed, Erma Heritage, PA-C 02/19/2019, 11:42 AM Pager: [phone]

## 2019-02-21 ENCOUNTER — Encounter: Payer: Self-pay | Admitting: Family Medicine

## 2019-03-10 DIAGNOSIS — Z720 Tobacco use: Secondary | ICD-10-CM | POA: Insufficient documentation

## 2019-03-10 DIAGNOSIS — E785 Hyperlipidemia, unspecified: Secondary | ICD-10-CM | POA: Insufficient documentation

## 2019-03-10 DIAGNOSIS — Z87898 Personal history of other specified conditions: Secondary | ICD-10-CM | POA: Insufficient documentation

## 2019-03-10 NOTE — Progress Notes (Deleted)
Cardiology Office Note    Date:  03/10/2019   ID:  Victor [name], DOB Oct 14, 1975, MRN [id number]  PCP:  [name], DO  Cardiologist: Rozann Lesches, MD EPS: None  No chief complaint on file.   History of Present Illness:  Victor [name] is a 43 y.o. male with history of family history of premature CAD, hyperlipidemia, hypertension tobacco abuse.   Seen by Dr. Domenic Polite for chest pain 02/2018 at which time [place] told he had mild blockages. Echo and Lexiscan were ordered but never done.  Past Medical History:  Diagnosis Date  . GERD (gastroesophageal reflux disease)   . History of stomach ulcers   . Hypertension     Past Surgical History:  Procedure Laterality Date  . ANTERIOR CRUCIATE LIGAMENT REPAIR Right 05/19/2018   Procedure: RIGHT KNEE ARTHROSCOPY WITH ANTERIOR CRUCIATE LIGAMENT (ACL) REPAIR and MEDIAL MENISECTOMY;  Surgeon: [name], MD;  Location: [facility];  Service: Orthopedics;  Laterality: Right;  . APPENDECTOMY    . KNEE ARTHROSCOPY WITH MEDIAL MENISECTOMY Right 05/29/2017   Procedure: KNEE ARTHROSCOPY WITH MEDIAL MENISECTOMY and lateral meniscectomy;  Surgeon: [name], MD;  Location: [facility];  Service: Orthopedics;  Laterality: Right;  . KNEE ARTHROSCOPY WITH MEDIAL MENISECTOMY Right 02/05/2018   Procedure: RIGHT KNEE ARTHROSCOPY WITH PARTIAL MEDIAL AND LATERAL MENISECTOMY;  Surgeon: [name], MD;  Location: [facility];  Service: Orthopedics;  Laterality: Right;    Current Medications: No outpatient medications have been marked as taking for the 03/15/19 encounter (Appointment) with Imogene Burn, PA-C.     Allergies:   Patient has no known allergies.   Social History   Socioeconomic History  . Marital status: Married    Spouse name: Not on file  . Number of children: 3  . Years of education: Not on file  . Highest education level: Not on file  Occupational  History  . Not on file  Social Needs  . Financial resource strain: Not on file  . Food insecurity    Worry: Not on file    Inability: Not on file  . Transportation needs    Medical: Not on file    Non-medical: Not on file  Tobacco Use  . Smoking status: Current Every Day Smoker    Packs/day: 1.00    Years: 15.00    Pack years: 15.00    Types: Cigarettes    Start date: 07/07/1991    Last attempt to quit: 04/09/2018    Years since quitting: 0.9  . Smokeless tobacco: Current User    Types: Chew  . Tobacco comment: quit a couple months ago  Substance and Sexual Activity  . Alcohol use: No  . Drug use: No  . Sexual activity: Yes  Lifestyle  . Physical activity    Days per week: Not on file    Minutes per session: Not on file  . Stress: Not on file  Relationships  . Social Herbalist on phone: Not on file    Gets together: Not on file    Attends religious service: Not on file    Active member of club or organization: Not on file    Attends meetings of clubs or organizations: Not on file    Relationship status: Not on file  Other Topics Concern  . Not on file  Social History Narrative  . Not on file  Family History:  The patient's ***family history includes Bone cancer in his maternal grandmother; Brain cancer in his maternal grandfather; CAD in his maternal grandfather; Cancer in his father and mother; Colon cancer in his paternal grandfather; Diabetes in his maternal grandmother and mother; Head & neck cancer in his brother; Healthy in his sister; Heart failure in his mother; Pancreatic cancer in his mother; Throat cancer in his father.   ROS:   Please see the history of present illness.    ROS All other systems reviewed and are negative.   PHYSICAL EXAM:   VS:  There were no vitals taken for this visit.  Physical Exam  GEN: Well nourished, well developed, in no acute distress  HEENT: normal  Neck: no JVD, carotid bruits, or masses Cardiac:RRR; no  murmurs, rubs, or gallops  Respiratory:  clear to auscultation bilaterally, normal work of breathing GI: soft, nontender, nondistended, + BS Ext: without cyanosis, clubbing, or edema, Good distal pulses bilaterally MS: no deformity or atrophy  Skin: warm and dry, no rash Neuro:  Alert and Oriented x 3, Strength and sensation are intact Psych: euthymic mood, full affect  Wt Readings from Last 3 Encounters:  02/18/19 238 lb (108 kg)  02/16/19 240 lb (108.9 kg)  10/05/18 252 lb (114.3 kg)      Studies/Labs Reviewed:   EKG:  EKG is*** ordered today.  The ekg ordered today demonstrates ***  Recent Labs: 02/16/2019: BUN 15; Creatinine, Ser 0.94; Hemoglobin 15.2; Platelets 269; Potassium 4.2; Sodium 137   Lipid Panel    Component Value Date/Time   CHOL 249 (H) 03/03/2018 1448   TRIG 193 (H) 03/03/2018 1448   HDL 41 03/03/2018 1448   CHOLHDL 6.1 (H) 03/03/2018 1448   LDLCALC 169 (H) 03/03/2018 1448    Additional studies/ records that were reviewed today include:      ASSESSMENT:    1. History of chest pain   2. Hyperlipidemia, unspecified hyperlipidemia type   3. Tobacco abuse      PLAN:  In order of problems listed above:  History of chest pain says [place] and was told he had mild blockages Lexiscan and echo ordered 02/2018 and never performed  Hyperlipidemia  Tobacco abuse    Medication Adjustments/Labs and Tests Ordered: Current medicines are reviewed at length with the patient today.  Concerns regarding medicines are outlined above.  Medication changes, Labs and Tests ordered today are listed in the Patient Instructions below. There are no Patient Instructions on file for this visit.   Sumner Boast, PA-C  03/10/2019 3:27 PM    [facility] [place], [place], [place]  [address] Phone: [phone]; Fax: [phone]

## 2019-03-11 ENCOUNTER — Other Ambulatory Visit: Payer: Self-pay

## 2019-03-11 ENCOUNTER — Encounter: Payer: Self-pay | Admitting: Physician Assistant

## 2019-03-11 ENCOUNTER — Ambulatory Visit (INDEPENDENT_AMBULATORY_CARE_PROVIDER_SITE_OTHER): Payer: Medicaid Other | Admitting: Physician Assistant

## 2019-03-11 DIAGNOSIS — K047 Periapical abscess without sinus: Secondary | ICD-10-CM

## 2019-03-11 MED ORDER — PENICILLIN V POTASSIUM 500 MG PO TABS: 500.0000 mg | ORAL_TABLET | Freq: Four times a day (QID) | ORAL | 1 refills | Status: DC

## 2019-03-11 NOTE — Progress Notes (Signed)
12 19     Telephone visit  Subjective: CC: Tooth infection PCP: [name], DO Victor [name] is a 43 y.o. male calls for telephone consult today. Patient provides verbal consent for consult held via phone.  Patient is identified with 2 separate identifiers.  At this time the entire area is on COVID-19 social distancing and stay home orders are in place.  Patient is of higher risk and therefore we are performing this by a virtual method.  Location of patient: Home Location of provider: HOME Others present for call: Daughter, he is having some neck swelling and pain is not able to speak.  But she asked some the questions and relate his answers.  This patient has had a few days of pain in the right upper tooth at the back.  He has never had any type of repair performed there.  He does not know of any broken teeth.  He denies any fever.  He has swelling around the area.  He denies any pus.  He states there is no swelling in his cheek or pain in that area.   ROS: Per HPI  No Known Allergies Past Medical History:  Diagnosis Date  . GERD (gastroesophageal reflux disease)   . History of stomach ulcers   . Hypertension     Current Outpatient Medications:  .  amLODipine (NORVASC) 5 MG tablet, Take 2.5 mg by mouth daily., Disp: , Rfl:  .  cyclobenzaprine (FLEXERIL) 10 MG tablet, Take 1 tablet (10 mg total) by mouth 3 (three) times daily as needed for muscle spasms., Disp: 30 tablet, Rfl: 0 .  predniSONE (STERAPRED UNI-PAK 21 TAB) 10 MG (21) TBPK tablet, As directed x 6 days, Disp: 21 tablet, Rfl: 0  Assessment/ Plan: 43 y.o. male   1. Dental abscess - penicillin v potassium (VEETID) 500 MG tablet; Take 1 tablet (500 mg total) by mouth 4 (four) times daily.  Dispense: 40 tablet; Refill: 1   No follow-ups on file.  Continue all other maintenance medications as listed above.  Start time: 12:18 PM End time: 12:23 PM  No orders of the defined types were placed in this  encounter.   Particia Nearing PA-C [place] [phone]

## 2019-03-15 ENCOUNTER — Ambulatory Visit: Payer: Medicaid Other | Admitting: Physician Assistant

## 2019-04-07 DIAGNOSIS — I10 Essential (primary) hypertension: Secondary | ICD-10-CM | POA: Insufficient documentation

## 2019-04-07 DIAGNOSIS — Z8249 Family history of ischemic heart disease and other diseases of the circulatory system: Secondary | ICD-10-CM | POA: Insufficient documentation

## 2019-04-07 NOTE — Progress Notes (Signed)
Cardiology Office Note    Date:  04/12/2019   ID:  Victor [name], DOB 25-Mar-1976, MRN [id number]  PCP:  [name], DO  Cardiologist: Rozann Lesches, MD EPS: None  Chief Complaint  Patient presents with  . Follow-up    History of Present Illness:  Victor [name] is a 43 y.o. male with history of hypertension and GERD family history of premature CAD in both parents, hyperlipidemia, tobacco abuse.  Saw Dr. Domenic Polite 02/2018 with chest pain and dyspnea on exertion.  Myoview and 2D echo ordered but was never done.  Patient was in the emergency room 02/16/2019 with chest pain while mowing grass on a riding lawnmower.  Chest was described as tight with associated nausea dizziness diaphoresis shortness of breath and tingling in his left arm.  Blood pressure initially 186/130.  Pain improved with 1 nitroglycerin.  Patient had previously been on antihypertensive medications but stopped 8 months ago because he did not like the way it made him feel.  Troponins were negative EKG unchanged and he was sent home on amlodipine 5 mg once daily.  He was having some side effects of left arm tingling and numbness from this and decreased it to 2.5 mg once daily.  Patient comes in for f/u. Patient says BP runs high at home if he gets salt in his diet.  No further chest pain. Tingling in his left hand if he is hold a landscape blower a certain way. Smoking 1/2ppd and is trying to cut back. Is active at work and with his 3 children walking/hiking regularly.    Past Medical History:  Diagnosis Date  . GERD (gastroesophageal reflux disease)   . History of stomach ulcers   . Hypertension     Past Surgical History:  Procedure Laterality Date  . ANTERIOR CRUCIATE LIGAMENT REPAIR Right 05/19/2018   Procedure: RIGHT KNEE ARTHROSCOPY WITH ANTERIOR CRUCIATE LIGAMENT (ACL) REPAIR and MEDIAL MENISECTOMY;  Surgeon: [name], MD;  Location: [facility];  Service: Orthopedics;  Laterality: Right;  .  APPENDECTOMY    . KNEE ARTHROSCOPY WITH MEDIAL MENISECTOMY Right 05/29/2017   Procedure: KNEE ARTHROSCOPY WITH MEDIAL MENISECTOMY and lateral meniscectomy;  Surgeon: [name], MD;  Location: [facility];  Service: Orthopedics;  Laterality: Right;  . KNEE ARTHROSCOPY WITH MEDIAL MENISECTOMY Right 02/05/2018   Procedure: RIGHT KNEE ARTHROSCOPY WITH PARTIAL MEDIAL AND LATERAL MENISECTOMY;  Surgeon: [name], MD;  Location: [facility];  Service: Orthopedics;  Laterality: Right;    Current Medications: Current Meds  Medication Sig  . amLODipine (NORVASC) 5 MG tablet Take 2.5 mg by mouth daily.  . cyclobenzaprine (FLEXERIL) 10 MG tablet Take 1 tablet (10 mg total) by mouth 3 (three) times daily as needed for muscle spasms.     Allergies:   Patient has no known allergies.   Social History   Socioeconomic History  . Marital status: Married    Spouse name: Not on file  . Number of children: 3  . Years of education: Not on file  . Highest education level: Not on file  Occupational History  . Not on file  Social Needs  . Financial resource strain: Not on file  . Food insecurity    Worry: Not on file    Inability: Not on file  . Transportation needs    Medical: Not on file    Non-medical: Not on file  Tobacco Use  . Smoking status: Current Every Day Smoker    Packs/day: 0.50  Years: 15.00    Pack years: 7.50    Types: Cigarettes    Start date: 07/07/1991    Last attempt to quit: 04/09/2018    Years since quitting: 1.0  . Smokeless tobacco: Current User    Types: Chew  . Tobacco comment: quit a couple months ago  Substance and Sexual Activity  . Alcohol use: No  . Drug use: No  . Sexual activity: Yes  Lifestyle  . Physical activity    Days per week: Not on file    Minutes per session: Not on file  . Stress: Not on file  Relationships  . Social Herbalist on phone: Not on file    Gets together: Not on file    Attends religious service: Not on file     Active member of club or organization: Not on file    Attends meetings of clubs or organizations: Not on file    Relationship status: Not on file  Other Topics Concern  . Not on file  Social History Narrative  . Not on file     Family History:  The patient's   family history includes Bone cancer in his maternal grandmother; Brain cancer in his maternal grandfather; CAD in his maternal grandfather; Cancer in his father and mother; Colon cancer in his paternal grandfather; Diabetes in his maternal grandmother and mother; Head & neck cancer in his brother; Healthy in his sister; Heart failure in his mother; Pancreatic cancer in his mother; Throat cancer in his father.   ROS:   Please see the history of present illness.    ROS All other systems reviewed and are negative.   PHYSICAL EXAM:   VS:  BP (!) 140/91   Pulse 82   Temp 97.7 F (36.5 C)   Ht 5\' 11"  (1.803 m)   Wt 244 lb (110.7 kg)   BMI 34.03 kg/m   Physical Exam  QIW:LNLGX, in no acute distress  Neck: no JVD, carotid bruits, or masses Cardiac:RRR; no murmurs, rubs, or gallops  Respiratory:  clear to auscultation bilaterally, normal work of breathing GI: soft, nontender, nondistended, + BS Ext: without cyanosis, clubbing, or edema, Good distal pulses bilaterally Neuro:  Alert and Oriented x 3 Psych: euthymic mood, full affect  Wt Readings from Last 3 Encounters:  04/12/19 244 lb (110.7 kg)  02/18/19 238 lb (108 kg)  02/16/19 240 lb (108.9 kg)      Studies/Labs Reviewed:   EKG:  EKG is not ordered today.  The ekg reviewed from 02/17/2019 demonstrates normal sinus rhythm, normal EKG Recent Labs: 02/16/2019: BUN 15; Creatinine, Ser 0.94; Hemoglobin 15.2; Platelets 269; Potassium 4.2; Sodium 137   Lipid Panel    Component Value Date/Time   CHOL 249 (H) 03/03/2018 1448   TRIG 193 (H) 03/03/2018 1448   HDL 41 03/03/2018 1448   CHOLHDL 6.1 (H) 03/03/2018 1448   LDLCALC 169 (H) 03/03/2018 1448    Additional studies/  records that were reviewed today include:       ASSESSMENT:    1. History of chest pain   2. Essential hypertension   3. Hyperlipidemia, unspecified hyperlipidemia type   4. Tobacco abuse   5. Family history of early CAD      PLAN:  In order of problems listed above:  History of chest pain in 2019 [place] and echo ordered but never done-in ED for chest pain 01/2019 MI ruled out. will have him schedule stress test and echo  Essential hypertension blood pressure elevated in the emergency room 02/16/2019 when he was observed for chest pain.  Troponins negative EKG unchanged and amlodipine started. BP still high. Will increase amlodipine to 10 mg daily.2 gm sodium diet.  Hyperlipidemia LDL 169 02/2018. Stopped his meds.last year. Will start lipitor 80 mg daily  Tobacco abuse smoking cessation and nicotine patches.  Family history of premature CAD-father had MI 41 yo.    Medication Adjustments/Labs and Tests Ordered: Current medicines are reviewed at length with the patient today.  Concerns regarding medicines are outlined above.  Medication changes, Labs and Tests ordered today are listed in the Patient Instructions below. Patient Instructions  Medication Instructions:  Your physician has recommended you make the following change in your medication:  Take Norvasc 10 mg Daily  Take Lipitor 80 mg Daily  Take Nicoderm as Directed   Labwork: Your physician recommends that you return for lab work in: On the day of your Stress test    Testing/Procedures: Your physician has requested that you have a lexiscan myoview. For further information please visit [email or web address]. Please follow instruction sheet, as given.    Follow-Up: Your physician recommends that you schedule a follow-up appointment after Stress test.    Any Other Special Instructions Will Be Listed Below (If Applicable).     If you need a refill on your cardiac medications before your next  appointment, please call your pharmacy.  Thank you for choosing [facility]!      Sumner Boast, PA-C  04/12/2019 11:56 AM    [facility] [place], [place], [place]  [address] Phone: [phone]; Fax: [phone]

## 2019-04-12 ENCOUNTER — Encounter: Payer: Self-pay | Admitting: Physician Assistant

## 2019-04-12 ENCOUNTER — Encounter: Payer: Self-pay | Admitting: *Deleted

## 2019-04-12 ENCOUNTER — Ambulatory Visit (INDEPENDENT_AMBULATORY_CARE_PROVIDER_SITE_OTHER): Payer: Medicaid Other | Admitting: Physician Assistant

## 2019-04-12 ENCOUNTER — Other Ambulatory Visit: Payer: Self-pay

## 2019-04-12 VITALS — BP 140/91 | HR 82 | Temp 97.7°F | Ht 71.0 in | Wt 244.0 lb

## 2019-04-12 DIAGNOSIS — E785 Hyperlipidemia, unspecified: Secondary | ICD-10-CM

## 2019-04-12 DIAGNOSIS — I10 Essential (primary) hypertension: Secondary | ICD-10-CM

## 2019-04-12 DIAGNOSIS — Z720 Tobacco use: Secondary | ICD-10-CM

## 2019-04-12 DIAGNOSIS — Z87898 Personal history of other specified conditions: Secondary | ICD-10-CM | POA: Diagnosis not present

## 2019-04-12 DIAGNOSIS — R079 Chest pain, unspecified: Secondary | ICD-10-CM

## 2019-04-12 DIAGNOSIS — Z8249 Family history of ischemic heart disease and other diseases of the circulatory system: Secondary | ICD-10-CM

## 2019-04-12 MED ORDER — NICOTINE 14 MG/24HR TD PT24: 14.0000 mg | MEDICATED_PATCH | Freq: Every day | TRANSDERMAL | 0 refills | Status: DC

## 2019-04-12 MED ORDER — AMLODIPINE BESYLATE 10 MG PO TABS: 10.0000 mg | ORAL_TABLET | Freq: Every day | ORAL | 3 refills | Status: DC

## 2019-04-12 MED ORDER — ATORVASTATIN CALCIUM 80 MG PO TABS: 80.0000 mg | ORAL_TABLET | Freq: Every day | ORAL | 3 refills | Status: DC

## 2019-04-12 MED ORDER — NICOTINE 7 MG/24HR TD PT24: 7.0000 mg | MEDICATED_PATCH | Freq: Every day | TRANSDERMAL | 0 refills | Status: DC

## 2019-04-12 MED ORDER — NICOTINE 21 MG/24HR TD PT24: 21.0000 mg | MEDICATED_PATCH | Freq: Every day | TRANSDERMAL | 0 refills | Status: DC

## 2019-04-12 NOTE — Patient Instructions (Addendum)
Medication Instructions:  Your physician has recommended you make the following change in your medication:  Take Norvasc 10 mg Daily  Take Lipitor 80 mg Daily  Take Nicoderm as Directed   Labwork: Your physician recommends that you return for lab work in: On the day of your Stress test    Testing/Procedures: Your physician has requested that you have a lexiscan myoview. For further information please visit [email or web address]. Please follow instruction sheet, as given.    Follow-Up: Your physician recommends that you schedule a follow-up appointment after Stress test.    Any Other Special Instructions Will Be Listed Below (If Applicable).     If you need a refill on your cardiac medications before your next appointment, please call your pharmacy.  Thank you for choosing [facility]!

## 2019-04-12 NOTE — Addendum Note (Signed)
Addended by: Levonne Hubert on: 04/12/2019 12:03 PM   Modules accepted: Orders

## 2019-04-15 ENCOUNTER — Ambulatory Visit (HOSPITAL_COMMUNITY): Payer: Medicaid Other

## 2019-04-15 ENCOUNTER — Encounter (HOSPITAL_COMMUNITY): Payer: Medicaid Other

## 2019-04-22 ENCOUNTER — Ambulatory Visit (HOSPITAL_COMMUNITY): Payer: Medicaid Other

## 2019-04-22 ENCOUNTER — Encounter (HOSPITAL_COMMUNITY): Payer: Medicaid Other

## 2019-04-28 NOTE — Progress Notes (Deleted)
Cardiology Office Note    Date:  04/28/2019   ID:  Victor [name], DOB August 17, 1976, MRN [id number]  PCP:  [name], DO  Cardiologist: Rozann Lesches, MD EPS: None  No chief complaint on file.   History of Present Illness:  Victor [name] is a 43 y.o. male  with history of hypertension and GERD family history of premature CAD in both parents, hyperlipidemia, tobacco abuse.  Saw Dr. Domenic Polite 02/2018 with chest pain and dyspnea on exertion.  Myoview and 2D echo ordered but was never done.   Patient was in the emergency room 02/16/2019 with chest pain while mowing grass on a riding lawnmower.  Chest was described as tight with associated nausea dizziness diaphoresis shortness of breath and tingling in his left arm.  Blood pressure initially 186/130.  Pain improved with 1 nitroglycerin.  Patient had previously been on antihypertensive medications but stopped 8 months ago because he did not like the way it made him feel.  Troponins were negative EKG unchanged and he was sent home on amlodipine 5 mg once daily.  He was having some side effects of left arm tingling and numbness from this and decreased it to 2.5 mg once daily.   I saw the patient in follow-up 04/12/2019 and reordered the Lexi and echo.  Blood pressure was elevated so I increased his amlodipine to 10 mg daily and instructed him on 2 g sodium diet.  I also restarted his Lipitor as he had stopped all his meds last year.   Past Medical History:  Diagnosis Date  . GERD (gastroesophageal reflux disease)   . History of stomach ulcers   . Hypertension     Past Surgical History:  Procedure Laterality Date  . ANTERIOR CRUCIATE LIGAMENT REPAIR Right 05/19/2018   Procedure: RIGHT KNEE ARTHROSCOPY WITH ANTERIOR CRUCIATE LIGAMENT (ACL) REPAIR and MEDIAL MENISECTOMY;  Surgeon: [name], MD;  Location: [facility];  Service: Orthopedics;  Laterality: Right;  . APPENDECTOMY    . KNEE ARTHROSCOPY WITH MEDIAL MENISECTOMY Right  05/29/2017   Procedure: KNEE ARTHROSCOPY WITH MEDIAL MENISECTOMY and lateral meniscectomy;  Surgeon: [name], MD;  Location: [facility];  Service: Orthopedics;  Laterality: Right;  . KNEE ARTHROSCOPY WITH MEDIAL MENISECTOMY Right 02/05/2018   Procedure: RIGHT KNEE ARTHROSCOPY WITH PARTIAL MEDIAL AND LATERAL MENISECTOMY;  Surgeon: [name], MD;  Location: [facility];  Service: Orthopedics;  Laterality: Right;    Current Medications: No outpatient medications have been marked as taking for the 05/10/19 encounter (Appointment) with Imogene Burn, PA-C.     Allergies:   Patient has no known allergies.   Social History   Socioeconomic History  . Marital status: Married    Spouse name: Not on file  . Number of children: 3  . Years of education: Not on file  . Highest education level: Not on file  Occupational History  . Not on file  Social Needs  . Financial resource strain: Not on file  . Food insecurity    Worry: Not on file    Inability: Not on file  . Transportation needs    Medical: Not on file    Non-medical: Not on file  Tobacco Use  . Smoking status: Current Every Day Smoker    Packs/day: 0.50    Years: 15.00    Pack years: 7.50    Types: Cigarettes    Start date: 07/07/1991    Last attempt to quit: 04/09/2018    Years since  quitting: 1.0  . Smokeless tobacco: Current User    Types: Chew  . Tobacco comment: quit a couple months ago  Substance and Sexual Activity  . Alcohol use: No  . Drug use: No  . Sexual activity: Yes  Lifestyle  . Physical activity    Days per week: Not on file    Minutes per session: Not on file  . Stress: Not on file  Relationships  . Social Herbalist on phone: Not on file    Gets together: Not on file    Attends religious service: Not on file    Active member of club or organization: Not on file    Attends meetings of clubs or organizations: Not on file    Relationship status: Not on file  Other Topics  Concern  . Not on file  Social History Narrative  . Not on file     Family History:  The patient's ***family history includes Bone cancer in his maternal grandmother; Brain cancer in his maternal grandfather; CAD in his maternal grandfather; Cancer in his father and mother; Colon cancer in his paternal grandfather; Diabetes in his maternal grandmother and mother; Head & neck cancer in his brother; Healthy in his sister; Heart failure in his mother; Pancreatic cancer in his mother; Throat cancer in his father.   ROS:   Please see the history of present illness.    ROS All other systems reviewed and are negative.   PHYSICAL EXAM:   VS:  There were no vitals taken for this visit.  Physical Exam  GEN: Well nourished, well developed, in no acute distress  HEENT: normal  Neck: no JVD, carotid bruits, or masses Cardiac:RRR; no murmurs, rubs, or gallops  Respiratory:  clear to auscultation bilaterally, normal work of breathing GI: soft, nontender, nondistended, + BS Ext: without cyanosis, clubbing, or edema, Good distal pulses bilaterally MS: no deformity or atrophy  Skin: warm and dry, no rash Neuro:  Alert and Oriented x 3, Strength and sensation are intact Psych: euthymic mood, full affect  Wt Readings from Last 3 Encounters:  04/12/19 244 lb (110.7 kg)  02/18/19 238 lb (108 kg)  02/16/19 240 lb (108.9 kg)      Studies/Labs Reviewed:   EKG:  EKG is*** ordered today.  The ekg ordered today demonstrates ***  Recent Labs: 02/16/2019: BUN 15; Creatinine, Ser 0.94; Hemoglobin 15.2; Platelets 269; Potassium 4.2; Sodium 137   Lipid Panel    Component Value Date/Time   CHOL 249 (H) 03/03/2018 1448   TRIG 193 (H) 03/03/2018 1448   HDL 41 03/03/2018 1448   CHOLHDL 6.1 (H) 03/03/2018 1448   LDLCALC 169 (H) 03/03/2018 1448    Additional studies/ records that were reviewed today include:  ***    ASSESSMENT:    1. History of chest pain   2. Essential hypertension   3.  Hyperlipidemia, unspecified hyperlipidemia type   4. Tobacco abuse   5. Family history of early CAD      PLAN:  In order of problems listed above:  History of chest pain Lexiscan and echo ordered  Essential hypertension amlodipine increased to 10 mg daily  Hyperlipidemia restart Lipitor last office visit  Tobacco abuse nicotine patches prescribed  Family history of premature CAD with father having an MI at 37 years old  Medication Adjustments/Labs and Tests Ordered: Current medicines are reviewed at length with the patient today.  Concerns regarding medicines are outlined above.  Medication changes, Labs and  Tests ordered today are listed in the Patient Instructions below. There are no Patient Instructions on file for this visit.   Sumner Boast, PA-C  04/28/2019 2:43 PM    [facility] [place], [place], [place]  [address] Phone: [phone]; Fax: [phone]

## 2019-05-06 ENCOUNTER — Encounter: Payer: Self-pay | Admitting: Family Medicine

## 2019-05-10 ENCOUNTER — Ambulatory Visit: Payer: Medicaid Other | Admitting: Physician Assistant

## 2019-05-11 ENCOUNTER — Encounter: Payer: Self-pay | Admitting: Physician Assistant

## 2019-05-24 ENCOUNTER — Ambulatory Visit: Payer: Medicaid Other

## 2019-05-24 ENCOUNTER — Ambulatory Visit: Payer: Medicaid Other | Admitting: Orthopedic Surgery

## 2019-05-24 ENCOUNTER — Other Ambulatory Visit: Payer: Self-pay

## 2019-05-24 ENCOUNTER — Encounter: Payer: Self-pay | Admitting: Orthopedic Surgery

## 2019-05-24 VITALS — BP 142/83 | HR 93 | Ht 71.0 in | Wt 246.0 lb

## 2019-05-24 DIAGNOSIS — M25561 Pain in right knee: Secondary | ICD-10-CM

## 2019-05-24 DIAGNOSIS — S76111A Strain of right quadriceps muscle, fascia and tendon, initial encounter: Secondary | ICD-10-CM

## 2019-05-24 MED ORDER — HYDROCODONE-ACETAMINOPHEN 7.5-325 MG PO TABS: 1.0000 | ORAL_TABLET | ORAL | 0 refills | Status: AC | PRN

## 2019-05-24 NOTE — Patient Instructions (Signed)
Partial quadriceps tendon strain/tear right knee  Wear playmaker brace for 6 weeks  Pain medicine hydrocodone 7.5 mg for 7 days then resume ibuprofen

## 2019-05-24 NOTE — Progress Notes (Signed)
Victor [name]  05/24/2019  HISTORY SECTION :  Chief Complaint  Patient presents with  . Knee Pain    right knee pain /bruising    HPI The patient presents for evaluation of pain right knee.  Patient is 43 years old status post ACL reconstruction right knee 2019.  Patient says he was standing up from a recliner felt a pop in his knee had pain swelling bruising weakness and giving way about 4 weeks ago.  Review of Systems  Constitutional: Negative for fever.  Skin: Negative.   Neurological: Negative for tingling.     has a past medical history of GERD (gastroesophageal reflux disease), History of stomach ulcers, and Hypertension.   Past Surgical History:  Procedure Laterality Date  . ANTERIOR CRUCIATE LIGAMENT REPAIR Right 05/19/2018   Procedure: RIGHT KNEE ARTHROSCOPY WITH ANTERIOR CRUCIATE LIGAMENT (ACL) REPAIR and MEDIAL MENISECTOMY;  Surgeon: [name], MD;  Location: [facility];  Service: Orthopedics;  Laterality: Right;  . APPENDECTOMY    . KNEE ARTHROSCOPY WITH MEDIAL MENISECTOMY Right 05/29/2017   Procedure: KNEE ARTHROSCOPY WITH MEDIAL MENISECTOMY and lateral meniscectomy;  Surgeon: [name], MD;  Location: [facility];  Service: Orthopedics;  Laterality: Right;  . KNEE ARTHROSCOPY WITH MEDIAL MENISECTOMY Right 02/05/2018   Procedure: RIGHT KNEE ARTHROSCOPY WITH PARTIAL MEDIAL AND LATERAL MENISECTOMY;  Surgeon: [name], MD;  Location: [facility];  Service: Orthopedics;  Laterality: Right;    Body mass index is 34.31 kg/m.   No Known Allergies   Current Outpatient Medications:  .  amLODipine (NORVASC) 10 MG tablet, Take 1 tablet (10 mg total) by mouth daily., Disp: 180 tablet, Rfl: 3 .  atorvastatin (LIPITOR) 80 MG tablet, Take 1 tablet (80 mg total) by mouth daily., Disp: 90 tablet, Rfl: 3 .  nicotine (NICODERM CQ) 21 mg/24hr patch, Place 1 patch (21 mg total) onto the skin daily., Disp: 28 patch, Rfl: 0 .  HYDROcodone-acetaminophen (NORCO) 7.5-325 MG  tablet, Take 1 tablet by mouth every 4 (four) hours as needed for up to 7 days for moderate pain., Disp: 42 tablet, Rfl: 0 .  nicotine (NICODERM CQ) 14 mg/24hr patch, Place 1 patch (14 mg total) onto the skin daily. (Patient not taking: Reported on 05/24/2019), Disp: 28 patch, Rfl: 0 .  nicotine (NICODERM CQ) 7 mg/24hr patch, Place 1 patch (7 mg total) onto the skin daily. (Patient not taking: Reported on 05/24/2019), Disp: 28 patch, Rfl: 0   PHYSICAL EXAM SECTION: 1) BP (!) 142/83   Pulse 93   Ht 5\' 11"  (1.803 m)   Wt 246 lb (111.6 kg)   BMI 34.31 kg/m   Body mass index is 34.31 kg/m. General appearance: Well-developed well-nourished no gross deformities  2) Cardiovascular normal pulse and perfusion in the lower extremities normal color without edema  3) Neurologically deep tendon reflexes are equal and normal, no sensation loss or deficits no pathologic reflexes  4) Psychological: Awake alert and oriented x3 mood and affect normal  5) Skin no lacerations or ulcerations no nodularity no palpable masses, no erythema or nodularity  6) Musculoskeletal:   Left knee skin is normal no knee effusion no tenderness normal range of motion stability and strength  Right knee has a bruise at the superolateral portion of the patella which is also tender there Reindel be a defect there.  He has painful weak extension including terminal extension painful range of motion decreased extension by 10 degrees small joint effusion ACL feels intact   MEDICAL  DECISION SECTION:  Encounter Diagnoses  Name Primary?  . Acute pain of right knee Yes  . Strain of right quadriceps tendon, initial encounter     Imaging X-ray right knee see x-ray report x-ray shows a stable for the tibial fixation from ACL surgery mild arthritis lateral compartment  Meds ordered this encounter  Medications  . HYDROcodone-acetaminophen (NORCO) 7.5-325 MG tablet    Sig: Take 1 tablet by mouth every 4 (four) hours as needed for up  to 7 days for moderate pain.    Dispense:  42 tablet    Refill:  0    Plan:  (Rx., Inj., surg., Frx, MRI/CT, XR:2)  Brace right knee 6 weeks Ibuprofen  Pain med if needed  11:31 AM Arther Abbott, MD  05/24/2019

## 2019-06-16 IMAGING — DX DG KNEE COMPLETE 4+V*R*
4 series · 4 of 4 positions shown · non-contrast
Comparison: Right knee films of 04/29/2017

CLINICAL DATA: Twisted knee yesterday with severe pain

EXAM:
RIGHT KNEE - COMPLETE 4+ VIEW

[knee ap]
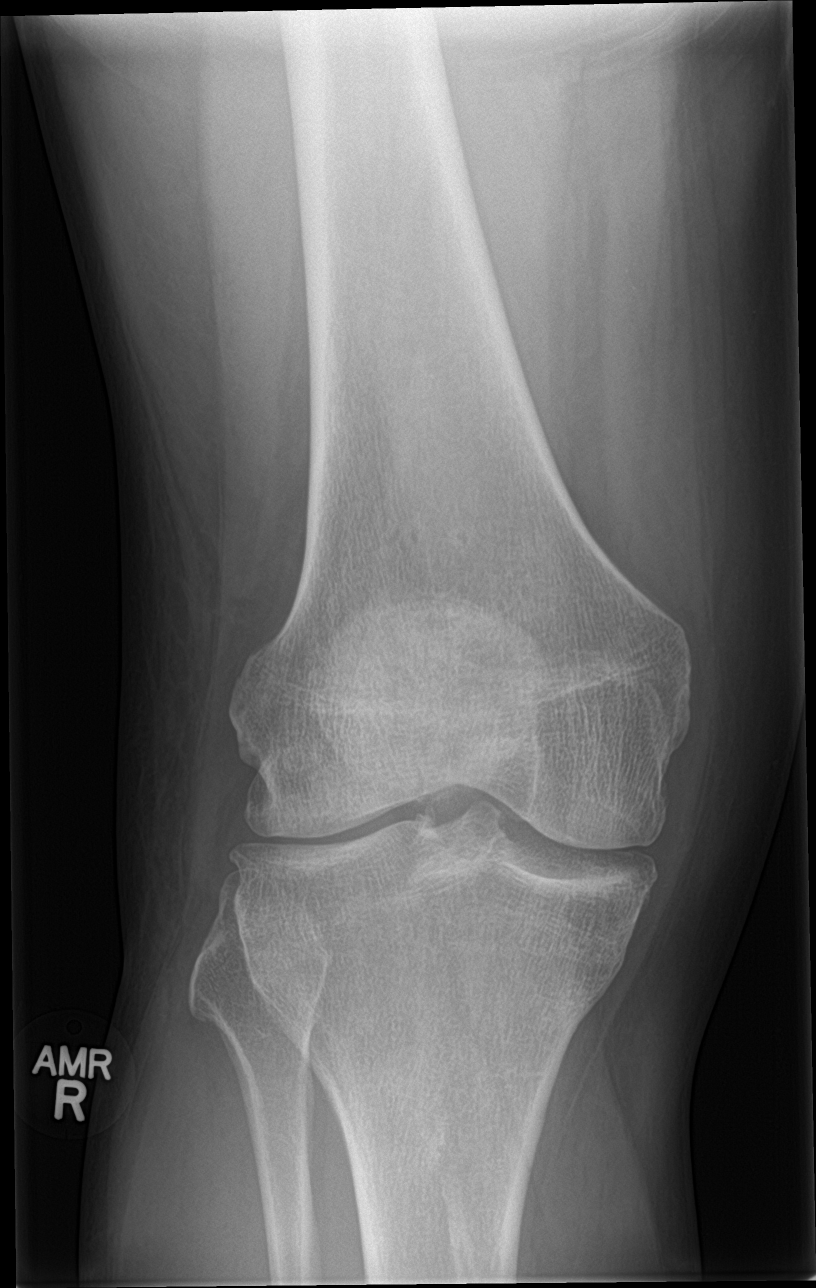

[tunnel]
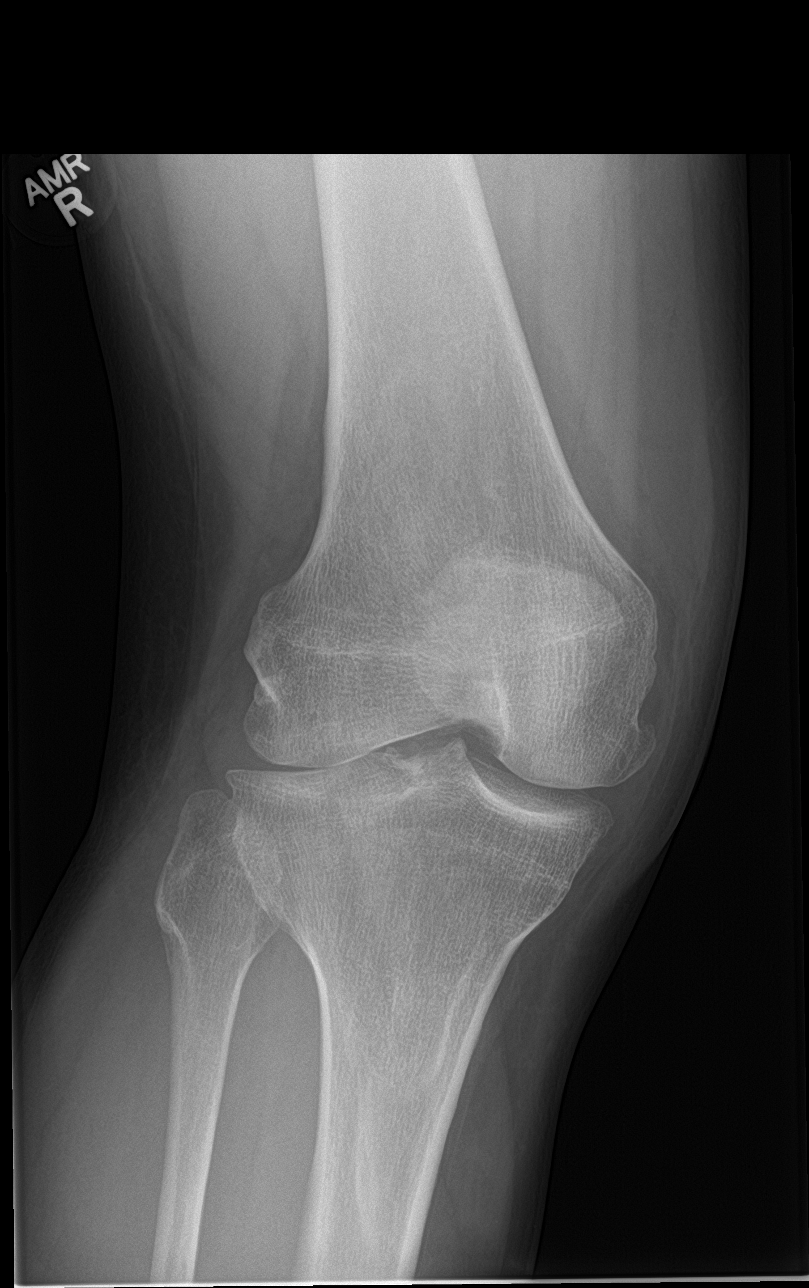

[knee lat]
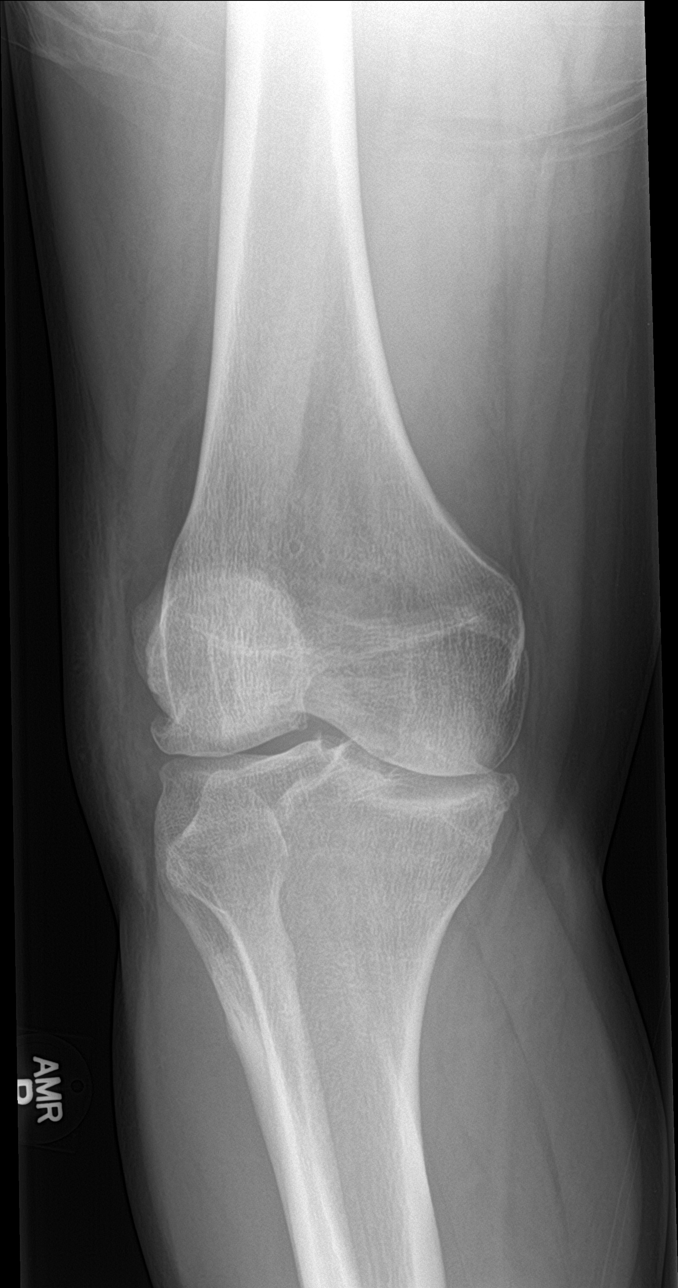

[knee sunrise]
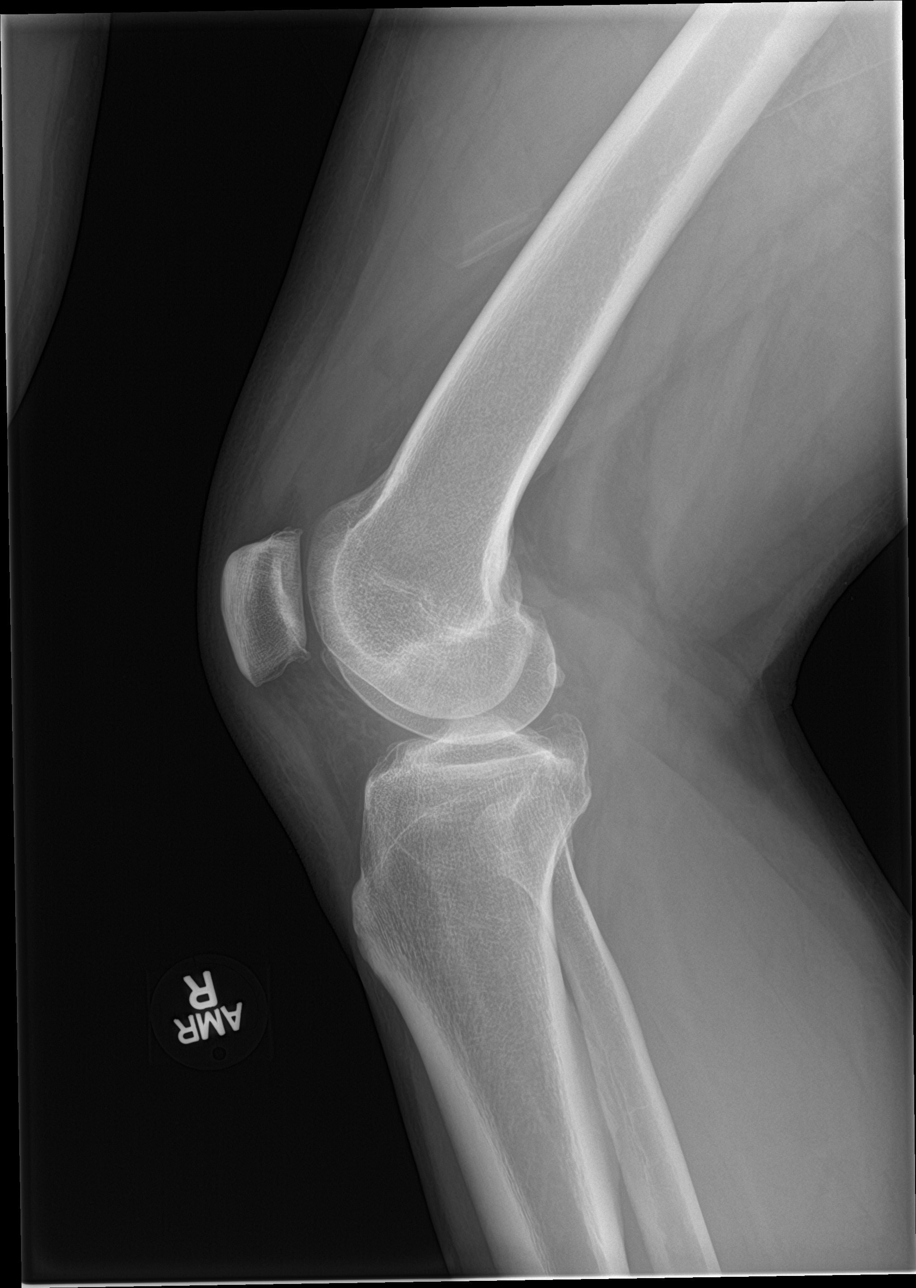

[4 of 4 positions shown; findings below may reference images not displayed]

FINDINGS: There is tricompartmental degenerative joint disease the right knee
primarily involving the lateral compartment where there is more loss
of joint space and spurring present. No fracture is seen. There may
be a small amount of right knee joint fluid noted.
IMPRESSION: 1. No acute fracture. Cannot exclude small amount of joint fluid on
the right.
2. Tricompartmental degenerative joint disease primarily involving
the lateral compartment

## 2019-07-05 ENCOUNTER — Ambulatory Visit: Payer: Medicaid Other | Admitting: Orthopedic Surgery

## 2019-07-05 ENCOUNTER — Encounter: Payer: Self-pay | Admitting: Orthopedic Surgery

## 2019-07-05 ENCOUNTER — Other Ambulatory Visit: Payer: Self-pay

## 2019-07-05 VITALS — BP 151/90 | HR 92 | Ht 71.0 in | Wt 246.0 lb

## 2019-07-05 DIAGNOSIS — M76891 Other specified enthesopathies of right lower limb, excluding foot: Secondary | ICD-10-CM | POA: Diagnosis not present

## 2019-07-05 NOTE — Patient Instructions (Signed)
We will order physical therapy you should receive a phone call from the PT department. If you havent in 3 days please call us to follow up on it     Icy hot vanishing gel or   Aspercreme with lidocaine odor free apply 3 x a day

## 2019-07-05 NOTE — Progress Notes (Signed)
Chief Complaint  Patient presents with  . Knee Pain    pulling in right medial knee     43 year old male was treated for quadriceps injury comes in with a 3 to 4-week history of pain over the medial hamstrings and pes tendons of his right knee.  He says he feels a lot of pulling when he goes up the stairs or flexes the knee  Review of Systems  Neurological: Negative for tingling, tremors and sensory change.   BP (!) 151/90   Pulse 92   Ht 5\' 11"  (1.803 m)   Wt 246 lb (111.6 kg)   BMI 34.31 kg/m  Physical Exam Musculoskeletal:     Comments: The patient has tenderness over the medial Pez tendons at the insertion site and this increases with flexion of the knee which is normal he has no instability there there is no swelling but there is tenderness the knee feels stable the skin is normal he has a normal sensation in that area  Neurological:     Mental Status: He is alert.  Psychiatric:        Mood and Affect: Mood normal.        Thought Content: Thought content normal.    Encounter Diagnosis  Name Primary?  . Pes anserinus tendonitis of right lower extremity Yes    Recommend liniment physical therapy and follow-up with me in 6 weeks

## 2019-07-05 NOTE — Addendum Note (Signed)
Addended byCandice Camp on: 07/05/2019 10:53 AM   Modules accepted: Orders

## 2019-07-13 ENCOUNTER — Ambulatory Visit (HOSPITAL_COMMUNITY): Payer: Medicaid Other | Attending: Orthopedic Surgery | Admitting: Physical Therapy

## 2019-07-13 DIAGNOSIS — M25561 Pain in right knee: Secondary | ICD-10-CM | POA: Insufficient documentation

## 2019-07-13 DIAGNOSIS — R2689 Other abnormalities of gait and mobility: Secondary | ICD-10-CM | POA: Insufficient documentation

## 2019-07-13 DIAGNOSIS — M6281 Muscle weakness (generalized): Secondary | ICD-10-CM | POA: Insufficient documentation

## 2019-07-15 ENCOUNTER — Ambulatory Visit (HOSPITAL_COMMUNITY): Payer: Medicaid Other | Admitting: Physical Therapy

## 2019-07-15 ENCOUNTER — Encounter (HOSPITAL_COMMUNITY): Payer: Self-pay | Admitting: Physical Therapy

## 2019-07-15 ENCOUNTER — Other Ambulatory Visit: Payer: Self-pay

## 2019-07-15 DIAGNOSIS — R2689 Other abnormalities of gait and mobility: Secondary | ICD-10-CM | POA: Diagnosis not present

## 2019-07-15 DIAGNOSIS — M6281 Muscle weakness (generalized): Secondary | ICD-10-CM

## 2019-07-15 DIAGNOSIS — M25561 Pain in right knee: Secondary | ICD-10-CM | POA: Diagnosis not present

## 2019-07-15 NOTE — Therapy (Addendum)
[place], [place], [address] Phone: [phone]   Fax:  [phone]  Physical Therapy Evaluation  Patient Details  Name: Victor [name] MRN: [id number] Date of Birth: Sep 08, 1975 Referring Provider (PT): Victor [name]   ROM -[phone] Encounter Date: 07/15/2019  PT End of Session - 07/15/19 1204    Visit Number  1    Number of Visits  12   requested 3 visits initially   Date for PT Re-Evaluation  08/26/19    Authorization Type  medicaid    Authorization - Visit Number  1    Authorization - Number of Visits 4   PT Start Time  1130    PT Stop Time  1210    PT Time Calculation (min)  40 min    Activity Tolerance  Patient tolerated treatment well    Behavior During Therapy  [name] for tasks assessed/performed       Past Medical History:  Diagnosis Date  . GERD (gastroesophageal reflux disease)   . History of stomach ulcers   . Hypertension     Past Surgical History:  Procedure Laterality Date  . ANTERIOR CRUCIATE LIGAMENT REPAIR Right 05/19/2018   Procedure: RIGHT KNEE ARTHROSCOPY WITH ANTERIOR CRUCIATE LIGAMENT (ACL) REPAIR and MEDIAL MENISECTOMY;  Surgeon: Victor Civil, MD;  Location: [facility];  Service: Orthopedics;  Laterality: Right;  . APPENDECTOMY    . KNEE ARTHROSCOPY WITH MEDIAL MENISECTOMY Right 05/29/2017   Procedure: KNEE ARTHROSCOPY WITH MEDIAL MENISECTOMY and lateral meniscectomy;  Surgeon: Victor Civil, MD;  Location: [facility];  Service: Orthopedics;  Laterality: Right;  . KNEE ARTHROSCOPY WITH MEDIAL MENISECTOMY Right 02/05/2018   Procedure: RIGHT KNEE ARTHROSCOPY WITH PARTIAL MEDIAL AND LATERAL MENISECTOMY;  Surgeon: Victor Civil, MD;  Location: [facility];  Service: Orthopedics;  Laterality: Right;    There were no vitals filed for this visit.   Subjective Assessment - 07/15/19 1130    Subjective  PT states that he had  arthroscopic surgery in 2018, he did well and then twisted  his knee causing more injury.  He then had a second surgery  with repair to both meniscus in June of 2019, he then had ACL surgery and had 19 visits of therapy, the last being in mid December of 2019.  Victor [name] states that he was standing up about 7 weeks ago and felt a pull in his knee, he went to the MD who put him in a brace for 6 weeks.  When he got out of the brace he was still hurting therefore he was referred to skilled PT.    Pertinent History  sp RT arthroscopic x2 / RT ACL repair.    Limitations  Sitting;Lifting;Standing;Walking;House hold activities    How long can you sit comfortably?  Able to sit for 15 minutes    How long can you stand comfortably?  Able to stand for 10-15 minutes    How long can you walk comfortably?  Able to walk without assistive device with pain; pain increases after 10-15 minutes.    Patient Stated Goals  less pain    Currently in Pain?  Yes    Pain Score  9    will go as high as a 10   Pain Location  Knee    Pain Orientation  Right;Anterior;Posterior    Pain Descriptors / Indicators  Aching    Pain Type  Acute pain    Pain Radiating Towards  anterior pain  is aching, pullling pain in posterior aspect .    Pain Onset  More than a month ago    Pain Frequency  Constant    Aggravating Factors   any pressure on his leg    Pain Relieving Factors  nothing    Effect of Pain on Daily Activities  limits         [facility] PT Assessment - 07/15/19 0001      Assessment   Medical Diagnosis  Pes anserinus tendonitis     Referring Provider (PT)  Victor [name]     Onset Date/Surgical Date  06/10/19    Next MD Visit  08/15/19    Prior Therapy  none for this issure; past for ACL repair      Precautions   Precautions  None      Restrictions   Weight Bearing Restrictions  No      Balance Screen   Has the patient fallen in the past 6 months  No    Has the patient had a decrease in activity level because of a fear of falling?   Yes    Is the patient reluctant to  leave their home because of a fear of falling?   No      Home Film/video editor residence    Living Arrangements  Spouse/significant other    Available Help at Discharge  Family    Type of [place] to enter    Entrance Stairs-Number of Steps  3   painful goes up sideways    Additional Comments  sleeping downstairs       Prior Function   Level of Independence  Independent    Vocation  Unemployed    Leisure  wood work       Associate Professor   Overall Cognitive Status  Within Functional Limits for tasks assessed      Functional Tests   Functional tests  Sit to Stand;Single leg stance      Single Leg Stance   Comments  Lt 40" ; RT 5"       Sit to Stand   Comments  6 in 30"   majority of wt on LT LE      ROM / Strength   AROM / PROM / Strength  Strength;AROM      AROM   AROM Assessment Site  Hip;Knee;Ankle    Right/Left Knee  Right    Right Knee Extension  -10    Right Knee Flexion  103      Strength   Strength Assessment Site  Hip;Knee;Ankle    Right/Left Hip  Right    Right Hip Flexion  5/5    Right Hip Extension  3/5    Right Hip ABduction  4+/5    Right/Left Knee  Right    Right Knee Flexion  3+/5    Right Knee Extension  3+/5    Right/Left Ankle  Right    Right Ankle Dorsiflexion  3+/5                Objective measurements completed on examination: See above findings.    10 each : LAQ, q-set, heel slide 3 active ham stretches.           PT Education - 07/15/19 1510    Education Details  HEP    Person(s) Educated  Patient    Methods  Explanation    Comprehension  Verbalized understanding       PT Short Term Goals - 07/15/19 1513      PT SHORT TERM GOAL #1   Title  Pt pain to be no greater than a 5/10 in his Rt knee to allow pt to walk for 30 minutes to complete short shopping trips.    Time  3    Period  Weeks    Status  New    Target Date  08/05/19      PT SHORT TERM GOAL #2    Title  PT strength in Rt knee to be increased 1/2 grade to allow pt to arise from a lower surface, ie couch with ease without using his hands.    Time  3    Period  Weeks    Status  New      PT SHORT TERM GOAL #3   Title  PT Rt knee extension to be less than five to allow a normalized gait.    Time  3    Period  Weeks    Status  New        PT Long Term Goals - 07/15/19 1519      PT LONG TERM GOAL #1   Title  PT pain in his RT knee to be no greater than a 3/10 to allow pt to walk for 60 minutes to allow pt to complete yard work.    Time  6    Period  Weeks    Status  New    Target Date  08/26/19      PT LONG TERM GOAL #2   Title  PT right knee flexion to be to 125 to allow pt to squat down to pick items off the floor.    Time  6    Period  Weeks    Status  New      PT LONG TERM GOAL #3   Title  Pt Rt knee strength to increase one grade to allow pt to go up and down steps in a reciprocal manner .    Time  6    Period  Weeks    Status  New             Plan - 07/15/19 1209    Clinical Impression Statement  Mr. Szwed is a 43 yo who has had two arthroscopic surgeries and one ACL repair on his RT knee.  He states that he went to get up approximately 7 weeks ago and felt immediate pain and experienced pain with weight bearing ever since.  He was placed in a brace for five weeks but this did nothing for his pain therefore he is being referred to skilled therapy.  Examination demonstrates decreased ROM, decreased strength, decreased balance, decreased activity tolerance and incrased pain.  Mr. Obara will benefit from skilled PT to address these issues and maximize his functioning ability.    Personal Factors and Comorbidities  Comorbidity 3+;Social Background;Finances    Comorbidities  2 arthroscopic sugeries; ACL repair    Examination-Activity Limitations  Bathing;Carry;Dressing;Lift;Locomotion Level;Reach Overhead;Squat;Stairs;Stand;Transfers    Examination-Participation  Restrictions  Cleaning;Community Activity;Driving;Laundry;Meal Prep;Shop;Yard Work    Stability/Clinical Decision Making  Stable/Uncomplicated    Clinical Decision Making  Low    Rehab Potential  Fair    PT Frequency  2x / week    PT Duration  6 weeks    PT Treatment/Interventions  ADLs/Self Care Home Management;Patient/family education;Manual techniques;Therapeutic exercise;Therapeutic activities;Functional mobility training;Balance training;Stair training;Gait training  PT Next Visit Plan  begin heel raises, functional mini squat keeping wt equal on both sides, standing and supine terminal extension, ham curls, and hip extension,    PT Home Exercise Plan  Progress ROM and strength as able.       Patient will benefit from skilled therapeutic intervention in order to improve the following deficits and impairments:  Abnormal gait, Decreased activity tolerance, Decreased balance, Decreased strength, Decreased range of motion, Difficulty walking, Pain  Visit Diagnosis: Acute pain of right knee - Plan: PT plan of care cert/re-cert  Muscle weakness (generalized) - Plan: PT plan of care cert/re-cert  Other abnormalities of gait and mobility - Plan: PT plan of care cert/re-cert     Problem List Patient Active Problem List   Diagnosis Date Noted  . Essential hypertension 04/07/2019  . Family history of early CAD 04/07/2019  . History of chest pain 03/10/2019  . Hyperlipidemia 03/10/2019  . Tobacco abuse 03/10/2019  . S/P right knee arthroscopy 02/05/18 06/23/2017  . S/P ACL repair right 05/19/18   . Chondromalacia of lateral femoral condyle, right   . Chondromalacia, patella, right     Rayetta Humphrey, PT CLT [phone] 07/16/2019, 8:06 AM  [place], [place], [address] Phone: [phone]   Fax:  [phone]  Name: Victor [name] MRN: [id number] Date of Birth: [birth year]

## 2019-07-15 NOTE — Patient Instructions (Addendum)
Knee Extension (Sitting)    Place ___2_ pound weight on right ankle and straighten knee fully, lower slowly. Repeat _10___ times per set. Do ___1_ sets per session. Do _2___ sessions per day.  http://orth.exer.us/732   Copyright  VHI. All rights reserved.  Toe Raise (Sitting)    Raise toes, keeping heels on floor. Repeat __10__ times per set. Do _1___ sets per session. Do _2___ sessions per day.  http://orth.exer.us/46   Copyright  VHI. All rights reserved.  Self-Mobilization: Heel Slide (Supine)    Slide right heel toward buttocks until a gentle stretch is felt. Hold ___10_ seconds. Relax. Repeat _5___ times per set. Do ____ sets per session. Do _2___ sessions per day. 1 http://orth.exer.us/710   Copyright  VHI. All rights reserved.  Strengthening: Quadriceps Set    Tighten muscles on top of thighs by pushing knees down into surface. Hold __5__ seconds. Repeat 10____ times per set. Do _1___ sets per session. Do _2___ sessions per day.  http://orth.exer.us/602   Copyright  VHI. All rights reserved.  Stretching: Hamstring (Supine)    Supporting right thigh behind knee, slowly straighten knee until stretch is felt in back of thigh. Hold ___20_ seconds. Now your heel down towards your thigh  Repeat _3___ times per set. Do ____ sets per session. Do __2__ sessions per day. 2 http://orth.exer.us/656   Copyright  VHI. All rights reserved.

## 2019-07-27 ENCOUNTER — Ambulatory Visit (HOSPITAL_COMMUNITY): Payer: Medicaid Other | Attending: Orthopedic Surgery | Admitting: Physical Therapy

## 2019-07-27 ENCOUNTER — Other Ambulatory Visit: Payer: Self-pay

## 2019-07-27 DIAGNOSIS — M6281 Muscle weakness (generalized): Secondary | ICD-10-CM | POA: Insufficient documentation

## 2019-07-27 DIAGNOSIS — M25561 Pain in right knee: Secondary | ICD-10-CM | POA: Insufficient documentation

## 2019-07-27 DIAGNOSIS — R2689 Other abnormalities of gait and mobility: Secondary | ICD-10-CM | POA: Diagnosis not present

## 2019-07-27 NOTE — Therapy (Signed)
[facility] [address] [place], [place], [address] Phone: [phone]   Fax:  [phone]  Physical Therapy Treatment  Patient Details  Name: Victor [name] MRN: [id number] Date of Birth: 11-Sep-1975 Referring Provider (PT): Arther Abbott    Encounter Date: 07/27/2019  PT End of Session - 07/27/19 1410    Visit Number  2    Number of Visits  12   requested 3 visits initially   Date for PT Re-Evaluation  08/26/19    Authorization Type  medicaid    Authorization - Visit Number  2    Authorization - Number of Visits  4    PT Start Time  1320    PT Stop Time  1400    PT Time Calculation (min)  40 min    Activity Tolerance  Patient tolerated treatment well    Behavior During Therapy  [facility] for tasks assessed/performed       Past Medical History:  Diagnosis Date  . GERD (gastroesophageal reflux disease)   . History of stomach ulcers   . Hypertension     Past Surgical History:  Procedure Laterality Date  . ANTERIOR CRUCIATE LIGAMENT REPAIR Right 05/19/2018   Procedure: RIGHT KNEE ARTHROSCOPY WITH ANTERIOR CRUCIATE LIGAMENT (ACL) REPAIR and MEDIAL MENISECTOMY;  Surgeon: [name], MD;  Location: [facility];  Service: Orthopedics;  Laterality: Right;  . APPENDECTOMY    . KNEE ARTHROSCOPY WITH MEDIAL MENISECTOMY Right 05/29/2017   Procedure: KNEE ARTHROSCOPY WITH MEDIAL MENISECTOMY and lateral meniscectomy;  Surgeon: [name], MD;  Location: [facility];  Service: Orthopedics;  Laterality: Right;  . KNEE ARTHROSCOPY WITH MEDIAL MENISECTOMY Right 02/05/2018   Procedure: RIGHT KNEE ARTHROSCOPY WITH PARTIAL MEDIAL AND LATERAL MENISECTOMY;  Surgeon: [name], MD;  Location: [facility];  Service: Orthopedics;  Laterality: Right;    There were no vitals filed for this visit.  Subjective Assessment - 07/27/19 1330    Subjective  pt states his pain level is 7/10 currently.  States his pain was elevated and lasted for a  couple days after his first session here.  STates he has been doing his HEP    Currently in Pain?  Yes    Pain Score  7     Pain Location  Knee    Pain Orientation  Right    Pain Descriptors / Indicators  Stabbing;Tightness    Pain Type  Acute pain                       [facility] Adult PT Treatment/Exercise - 07/27/19 0001      Knee/Hip Exercises: Standing   Heel Raises  Both;15 reps    Knee Flexion  Right;10 reps    Hip Extension  Right;10 reps    Functional Squat  10 reps    Functional Squat Limitations  minisqsuats    Other Standing Knee Exercises  .hip extension 10 reps      Knee/Hip Exercises: Seated   Long Arc Quad  Right;10 reps    Sit to [employer]  5 reps      Knee/Hip Exercises: Supine   Quad Sets  Right;10 reps    Heel Slides  5 reps    Knee Extension  AROM    Knee Extension Limitations  9    Knee Flexion  AROM    Knee Flexion Limitations  98             PT Education -  07/27/19 1412    Education Details  goal and HEP review, POC moving forward    Person(s) Educated  Patient    Methods  Explanation    Comprehension  Verbalized understanding       PT Short Term Goals - 07/27/19 1413      PT SHORT TERM GOAL #1   Title  Pt pain to be no greater than a 5/10 in his Rt knee to allow pt to walk for 30 minutes to complete short shopping trips.    Time  3    Period  Weeks    Status  On-going    Target Date  08/05/19      PT SHORT TERM GOAL #2   Title  PT strength in Rt knee to be increased 1/2 grade to allow pt to arise from a lower surface, ie couch with ease without using his hands.    Time  3    Period  Weeks    Status  On-going      PT SHORT TERM GOAL #3   Title  PT Rt knee extension to be less than five to allow a normalized gait.    Time  3    Period  Weeks    Status  On-going        PT Long Term Goals - 07/27/19 1413      PT LONG TERM GOAL #1   Title  PT pain in his RT knee to be no greater than a 3/10 to allow pt to walk for 60  minutes to allow pt to complete yard work.    Time  6    Period  Weeks    Status  On-going      PT LONG TERM GOAL #2   Title  PT right knee flexion to be to 125 to allow pt to squat down to pick items off the floor.    Time  6    Period  Weeks    Status  On-going      PT LONG TERM GOAL #3   Title  Pt Rt knee strength to increase one grade to allow pt to go up and down steps in a reciprocal manner .    Time  6    Period  Weeks    Status  On-going            Plan - 07/27/19 1413    Clinical Impression Statement  reveiwed goals, HEP andPOC moving forward.  Continued with Rt LE strengthening and focus on improving flexion and extension.  Today AROM only 9-98 degrees (was 10-103 last session).  Pt verbalized discomfort with mid-end range flexion both medial/anterior and posteiror LE.    Personal Factors and Comorbidities  Comorbidity 3+;Social Background;Finances    Comorbidities  2 arthroscopic sugeries; ACL repair    Examination-Activity Limitations  Bathing;Carry;Dressing;Lift;Locomotion Level;Reach Overhead;Squat;Stairs;Stand;Transfers    Examination-Participation Restrictions  Cleaning;Community Activity;Driving;Laundry;Meal Prep;Shop;Yard Work    Stability/Clinical Decision Making  Stable/Uncomplicated    Rehab Potential  Fair    PT Frequency  2x / week    PT Duration  6 weeks    PT Treatment/Interventions  ADLs/Self Care Home Management;Patient/family education;Manual techniques;Therapeutic exercise;Therapeutic activities;Functional mobility training;Balance training;Stair training;Gait training    PT Next Visit Plan  Next session begin standing and supine terminal extension.  Progress as tolerated.  Add standing knee stretches.    PT Home Exercise Plan  Progress ROM and strength as able.       Patient  will benefit from skilled therapeutic intervention in order to improve the following deficits and impairments:  Abnormal gait, Decreased activity tolerance, Decreased balance,  Decreased strength, Decreased range of motion, Difficulty walking, Pain  Visit Diagnosis: Other abnormalities of gait and mobility  Muscle weakness (generalized)  Acute pain of right knee     Problem List Patient Active Problem List   Diagnosis Date Noted  . Essential hypertension 04/07/2019  . Family history of early CAD 04/07/2019  . History of chest pain 03/10/2019  . Hyperlipidemia 03/10/2019  . Tobacco abuse 03/10/2019  . S/P right knee arthroscopy 02/05/18 06/23/2017  . S/P ACL repair right 05/19/18   . Chondromalacia of lateral femoral condyle, right   . Chondromalacia, patella, right    Teena Irani, PTA/CLT [phone]  Teena Irani 07/27/2019, 2:18 PM  [place], [place], [address] Phone: [phone]   Fax:  [phone]  Name: Victor [name] MRN: [id number] Date of Birth: [birth year]

## 2019-07-29 ENCOUNTER — Telehealth: Payer: Self-pay | Admitting: Orthopedic Surgery

## 2019-07-29 ENCOUNTER — Ambulatory Visit (INDEPENDENT_AMBULATORY_CARE_PROVIDER_SITE_OTHER): Payer: Medicaid Other | Admitting: Orthopedic Surgery

## 2019-07-29 ENCOUNTER — Encounter (HOSPITAL_COMMUNITY): Payer: Medicaid Other | Admitting: Physical Therapy

## 2019-07-29 ENCOUNTER — Telehealth (HOSPITAL_COMMUNITY): Payer: Self-pay | Admitting: Physical Therapy

## 2019-07-29 ENCOUNTER — Encounter: Payer: Self-pay | Admitting: Orthopedic Surgery

## 2019-07-29 ENCOUNTER — Other Ambulatory Visit: Payer: Self-pay

## 2019-07-29 VITALS — BP 157/86 | HR 98 | Temp 98.6°F | Ht 71.0 in | Wt 246.0 lb

## 2019-07-29 DIAGNOSIS — M25561 Pain in right knee: Secondary | ICD-10-CM

## 2019-07-29 NOTE — Telephone Encounter (Signed)
pt called to cx today's appt due to he needs to see his doctor today before therapy.

## 2019-07-29 NOTE — Progress Notes (Addendum)
Victor [name]  07/30/2019  Body mass index is 34.31 kg/m.   HISTORY SECTION :  Chief Complaint  Patient presents with  . Knee Injury    Pt states he felt tear/pop in PT 07/27/19 and now he's having bruising and swellign to the knee.   HPI The patient presents for evaluation of  (mild/moderate/severe/ ) severe pain, in the (right /left) right knee, for 1 day, associated with swelling.  Prior treatment none  43 year old male status post ACL reconstruction medial meniscectomy 2019 September after prior surgeries on same knee in June 2019 in October 2018 presents with acute knee pain after being in physical therapy for quadricep strain.  The knee was forced backwards a loud pop was noticed acute effusion medial lateral joint pain inability to weight-bear and now the patient cannot extend his knee     Review of Systems  Constitutional: Negative for chills, fever, malaise/fatigue and weight loss.  Neurological: Negative for tingling.  All other systems reviewed and are negative.    has a past medical history of GERD (gastroesophageal reflux disease), History of stomach ulcers, and Hypertension.   Past Surgical History:  Procedure Laterality Date  . ANTERIOR CRUCIATE LIGAMENT REPAIR Right 05/19/2018   Procedure: RIGHT KNEE ARTHROSCOPY WITH ANTERIOR CRUCIATE LIGAMENT (ACL) REPAIR and MEDIAL MENISECTOMY;  Surgeon: [name], MD;  Location: [facility];  Service: Orthopedics;  Laterality: Right;  . APPENDECTOMY    . KNEE ARTHROSCOPY WITH MEDIAL MENISECTOMY Right 05/29/2017   Procedure: KNEE ARTHROSCOPY WITH MEDIAL MENISECTOMY and lateral meniscectomy;  Surgeon: [name], MD;  Location: [facility];  Service: Orthopedics;  Laterality: Right;  . KNEE ARTHROSCOPY WITH MEDIAL MENISECTOMY Right 02/05/2018   Procedure: RIGHT KNEE ARTHROSCOPY WITH PARTIAL MEDIAL AND LATERAL MENISECTOMY;  Surgeon: [name], MD;  Location: [facility];  Service: Orthopedics;  Laterality: Right;     Body mass index is 34.31 kg/m.   No Known Allergies   Current Outpatient Medications:  .  amLODipine (NORVASC) 10 MG tablet, Take 1 tablet (10 mg total) by mouth daily., Disp: 180 tablet, Rfl: 3 .  atorvastatin (LIPITOR) 80 MG tablet, Take 1 tablet (80 mg total) by mouth daily., Disp: 90 tablet, Rfl: 3 .  ibuprofen (ADVIL) 800 MG tablet, Take 1 tablet (800 mg total) by mouth every 8 (eight) hours as needed., Disp: 30 tablet, Rfl: 0 .  oxyCODONE-acetaminophen (PERCOCET/ROXICET) 5-325 MG tablet, Take 1 tablet by mouth every 6 (six) hours as needed for up to 7 days for severe pain., Disp: 28 tablet, Rfl: 0   PHYSICAL EXAM SECTION: 1) BP (!) 157/86   Pulse 98   Temp 98.6 F (37 C)   Ht 5\' 11"  (1.803 m)   Wt 246 lb (111.6 kg)   BMI 34.31 kg/m   Body mass index is 34.31 kg/m. General appearance: Well-developed well-nourished no gross deformities  2) Cardiovascular normal pulse and perfusion in the lower extremities normal color without edema  3) Neurologically deep tendon reflexes are equal and normal, no sensation loss or deficits no pathologic reflexes  4) Psychological: Awake alert and oriented x3 mood and affect normal  5) Skin no lacerations or ulcerations no nodularity no palpable masses, no erythema or nodularity  6) Musculoskeletal:   Left knee no tenderness normal range of motion no instability strength and muscle tone normal  Right knee range of motion is 30-90 it is painful medial lateral joint lines are both tender there is an acute joint effusion Muscle tone is  normal quadriceps tendon is still tender from the prior injury patella tendon intact and tender  Block to full extension     MEDICAL DECISION SECTION:  Encounter Diagnosis  Name Primary?  . Acute pain of right knee Yes   Meds ordered this encounter  Medications  . ibuprofen (ADVIL) 800 MG tablet    Sig: Take 1 tablet (800 mg total) by mouth every 8 (eight) hours as needed.    Dispense:  30  tablet    Refill:  0  . oxyCODONE-acetaminophen (PERCOCET/ROXICET) 5-325 MG tablet    Sig: Take 1 tablet by mouth every 6 (six) hours as needed for up to 7 days for severe pain.    Dispense:  28 tablet    Refill:  0    Imaging No new imaging was obtained as there was no fall  Plan:  (Rx., Inj., surg., Frx, MRI/CT, XR:2)  Recommend MRI right knee  AP of the knee and both patella lateral right knee and this shows no arthritic changes in either knee no effusion follow-up in-house for MRI results and possible surgery  8:18 AM Arther Abbott, MD  07/30/2019

## 2019-07-29 NOTE — Telephone Encounter (Signed)
Victor [name] called back late this afternoon stating he thought Dr Aline Brochure was going to send something in for pain to [facility] on [address]  He said he called there but they didn't have anything sent in for him.  He asks that Dr. Aline Brochure send this in for him.  Thanks

## 2019-07-29 NOTE — Patient Instructions (Signed)
Apply ice 4-5 times a day  Stop therapy  MRI will be scheduled  You will come back to the office after the MRI  Take ibuprofen 800 mg 3 times a day  Percocet 5 mg every 6 for 4 to 5 days

## 2019-07-30 MED ORDER — IBUPROFEN 800 MG PO TABS: 800.0000 mg | ORAL_TABLET | Freq: Three times a day (TID) | ORAL | 0 refills | Status: DC | PRN

## 2019-07-30 MED ORDER — OXYCODONE-ACETAMINOPHEN 5-325 MG PO TABS: 1.0000 | ORAL_TABLET | Freq: Four times a day (QID) | ORAL | 0 refills | Status: AC | PRN

## 2019-07-30 NOTE — Addendum Note (Signed)
Addended by: [name] on: 07/30/2019 08:18 AM   Modules accepted: Orders

## 2019-08-03 ENCOUNTER — Ambulatory Visit (HOSPITAL_COMMUNITY): Payer: Medicaid Other | Admitting: Physical Therapy

## 2019-08-03 ENCOUNTER — Telehealth (HOSPITAL_COMMUNITY): Payer: Self-pay | Admitting: Physical Therapy

## 2019-08-03 NOTE — Telephone Encounter (Signed)
Called patient regarding appointment scheduled today and discuss symptoms he described to MD.  Pt reported he actually experienced this event during his initial visit here (11/19) and states he was just hurt worse at end of session and had increased edema following along with bruising.  Pt did not confirm or deny stating to MD that he heard a "pop" or "tear".    Evaluating therapist that worked with him this day was unable to recall any events that occurred that day or patient voicing any incident or trauma (cindy russell, PT).  I did not observe any trauma, bruising, antalgia or any other abnormalities during his session on 12/01 as patient did not appear to be in any distress or discomfort.   Also of note his ROM this day on 12/01 was 9-98 Rt knee.    Teena Irani, PTA/CLT [phone]

## 2019-08-03 NOTE — Telephone Encounter (Signed)
Upon chart review for patients appointment today was noted MD's note written on 12/3 in which MD stated in subjective:  Pt states he felt tear/pop in PT 07/27/19 and now he's having bruising and swellign to the knee. Pt was seen on 12/1 in outpatient PT and I was the treating therapist.  Pt did come to therapy that day pointing out bruising (which therapist did not see) and also some mild swelling at beginning of session.  Pt was able to complete full session and never complained of a "pop" or "tear" nor did the therapist hear or observe these events.    Also stated by MD he was to stop therapy and patient has not notified therapy clinic with pending appointment this afternoon.  Therapist will call and discuss with patient  Teena Irani, PTA/CLT [phone]

## 2019-08-05 ENCOUNTER — Encounter (HOSPITAL_COMMUNITY): Payer: Medicaid Other | Admitting: Physical Therapy

## 2019-08-10 ENCOUNTER — Encounter (HOSPITAL_COMMUNITY): Payer: Medicaid Other

## 2019-08-12 ENCOUNTER — Ambulatory Visit (HOSPITAL_COMMUNITY)
Admission: RE | Admit: 2019-08-12 | Discharge: 2019-08-12 | Disposition: A | Discharge status: Home / Self Care | Payer: Medicaid Other | Source: Ambulatory Visit | Attending: Orthopedic Surgery | Admitting: Orthopedic Surgery

## 2019-08-12 ENCOUNTER — Other Ambulatory Visit: Payer: Self-pay

## 2019-08-12 ENCOUNTER — Encounter (HOSPITAL_COMMUNITY): Payer: Medicaid Other

## 2019-08-12 DIAGNOSIS — M25561 Pain in right knee: Secondary | ICD-10-CM | POA: Diagnosis not present

## 2019-08-16 ENCOUNTER — Ambulatory Visit: Payer: Medicaid Other | Admitting: Orthopedic Surgery

## 2019-08-16 ENCOUNTER — Other Ambulatory Visit: Payer: Self-pay

## 2019-08-16 ENCOUNTER — Encounter: Payer: Self-pay | Admitting: Orthopedic Surgery

## 2019-08-16 VITALS — BP 136/83 | HR 102 | Ht 71.0 in | Wt 249.0 lb

## 2019-08-16 DIAGNOSIS — M1711 Unilateral primary osteoarthritis, right knee: Secondary | ICD-10-CM

## 2019-08-16 MED ORDER — MELOXICAM 7.5 MG PO TABS: 7.5000 mg | ORAL_TABLET | Freq: Every day | ORAL | 5 refills | Status: DC

## 2019-08-16 NOTE — Patient Instructions (Addendum)
3 x a day prone hang   Start new medication meloxicam one a day   Ice the knee 3 x a day   You have received an injection of steroids into the joint. 15% of patients will have increased pain within the 24 hours postinjection.   This is transient and will go away.   We recommend that you use ice packs on the injection site for 20 minutes every 2 hours and extra strength Tylenol 2 tablets every 8 as needed until the pain resolves.  If you continue to have pain after taking the Tylenol and using the ice please call the office for further instructions.

## 2019-08-16 NOTE — Progress Notes (Signed)
Follow-up visit for Victor [name] status post MRI status post increased pain left knee  Chief Complaint  Patient presents with  . Knee Pain    right / still painful     Review of systems no fever no erythema no rashes  Patient currently ambulating in a brace   Personal image interpretation His MRI showed his ACL graft was intact but is arthritis in the patellofemoral joint and medial femoral condyle are progressing  See report for details basically he has a partial-thickness defect lateral patella facet upper pole new since last MRI and he also has thinning and irregularity of the cartilage on the medial side of the knee with a full-thickness defect on the weightbearing surfaces measuring 0.4 cm x 1 cm  Menisci were intact with notable prior resections  I gave him a cortisone injection started him on anti-inflammatory he will continue his brace  He had a developing flexion contracture so prone hangs are in order to help with this  Recommend a 4-week follow-up  Procedure note right knee injection   verbal consent was obtained to inject right knee joint  Timeout was completed to confirm the site of injection  The medications used were 40 mg of Depo-Medrol and 1% lidocaine 3 cc  Anesthesia was provided by ethyl chloride and the skin was prepped with alcohol.  After cleaning the skin with alcohol a 20-gauge needle was used to inject the right knee joint. There were no complications. A sterile bandage was applied.

## 2019-08-17 ENCOUNTER — Encounter (HOSPITAL_COMMUNITY): Payer: Medicaid Other

## 2019-08-19 ENCOUNTER — Encounter (HOSPITAL_COMMUNITY): Payer: Medicaid Other | Admitting: Physical Therapy

## 2019-08-24 ENCOUNTER — Encounter (HOSPITAL_COMMUNITY): Payer: Medicaid Other

## 2019-08-26 ENCOUNTER — Encounter (HOSPITAL_COMMUNITY): Payer: Medicaid Other

## 2019-08-30 ENCOUNTER — Encounter (HOSPITAL_COMMUNITY): Payer: Medicaid Other | Admitting: Physical Therapy

## 2019-08-31 ENCOUNTER — Encounter: Payer: Self-pay | Admitting: Family Medicine

## 2019-09-01 ENCOUNTER — Other Ambulatory Visit: Payer: Self-pay

## 2019-09-01 ENCOUNTER — Encounter (HOSPITAL_COMMUNITY): Payer: Medicaid Other | Admitting: Physical Therapy

## 2019-09-02 ENCOUNTER — Ambulatory Visit: Payer: Medicaid Other | Admitting: Family Medicine

## 2019-09-02 ENCOUNTER — Encounter: Payer: Self-pay | Admitting: Family Medicine

## 2019-09-02 ENCOUNTER — Ambulatory Visit (INDEPENDENT_AMBULATORY_CARE_PROVIDER_SITE_OTHER): Payer: Medicaid Other

## 2019-09-02 VITALS — BP 142/95 | HR 83 | Temp 99.1°F | Wt 250.8 lb

## 2019-09-02 DIAGNOSIS — M5442 Lumbago with sciatica, left side: Secondary | ICD-10-CM

## 2019-09-02 DIAGNOSIS — M545 Low back pain: Secondary | ICD-10-CM | POA: Diagnosis not present

## 2019-09-02 MED ORDER — HYDROCODONE-ACETAMINOPHEN 5-325 MG PO TABS: 1.0000 | ORAL_TABLET | Freq: Four times a day (QID) | ORAL | 0 refills | Status: AC | PRN

## 2019-09-02 NOTE — Progress Notes (Signed)
Assessment & Plan:  1. Acute midline low back pain with left-sided sciatica - Education provided on exercises for the patient to complete daily for his back.  Encouraged him to continue application of heat.  Discussed that he should not be taking both meloxicam and ibuprofen as they are both NSAIDs.  I did recommend that he take one of the NSAIDs with Tylenol 500 mg for pain.  I did give him a prescription for Norco 5-325 and recommended that he try to make this last as long as possible and only use it for severe pain. - HYDROcodone-acetaminophen (NORCO) 5-325 MG tablet; Take 1 tablet by mouth every 6 (six) hours as needed for up to 5 days for moderate pain.  Dispense: 20 tablet; Refill: 0 - DG Lumbar Spine 2-3 Views   Follow up plan: Return in about 6 weeks (around 10/14/2019) for back pain .  Hendricks Limes, MSN, APRN, FNP-C [facility]  Subjective:   Patient ID: Akihiro Cogburn Balis, male    DOB: 29-Sep-1975, 44 y.o.   MRN: [id number]  HPI: Precious Derman Lincoln is a 44 y.o. male presenting on 09/02/2019 for Back Pain (started 5 weeks ago. Taken 800mg  ibuprofen with no help. puts heat and ice )  Back Pain: Patient presents for presents evaluation of low back problems.  Symptoms have been present for 5 weeks and include pain in the lumbar spine (aching and sharp in character; 6-7/10 in severity on average).  Patient does experience a shooting pain down his left leg all the way to his foot from his back.  Initial inciting event: none. Symptoms are not worse any particular time of day.  Alleviating factors identifiable by patient are recumbency. Exacerbating factors identifiable by patient are sitting and walking. Treatments so far initiated by patient: Ibuprofen 800 mg, heat, ice, and Aspercream with Lidocaine Previous lower back problems: 2013. Previous workup: Lumbar spine x-ray in 2013 with mild degenerative disc disease and compression deformity of L1 and L2 vertebral bodies.    ROS:  Negative unless specifically indicated above in HPI.   Relevant past medical history reviewed and updated as indicated.   Allergies and medications reviewed and updated.   Current Outpatient Medications:  .  ibuprofen (ADVIL) 800 MG tablet, Take 1 tablet (800 mg total) by mouth every 8 (eight) hours as needed., Disp: 30 tablet, Rfl: 0 .  meloxicam (MOBIC) 7.5 MG tablet, Take 1 tablet (7.5 mg total) by mouth daily., Disp: 30 tablet, Rfl: 5 .  amLODipine (NORVASC) 10 MG tablet, Take 1 tablet (10 mg total) by mouth daily., Disp: 180 tablet, Rfl: 3 .  atorvastatin (LIPITOR) 80 MG tablet, Take 1 tablet (80 mg total) by mouth daily., Disp: 90 tablet, Rfl: 3 .  HYDROcodone-acetaminophen (NORCO) 5-325 MG tablet, Take 1 tablet by mouth every 6 (six) hours as needed for up to 5 days for moderate pain., Disp: 20 tablet, Rfl: 0  No Known Allergies  Objective:   BP (!) 142/95   Pulse 83   Temp 99.1 F (37.3 C)   Wt 250 lb 12.8 oz (113.8 kg)   SpO2 98%   BMI 34.98 kg/m    Physical Exam Vitals reviewed.  Constitutional:      General: He is not in acute distress.    Appearance: Normal appearance. He is obese. He is not ill-appearing, toxic-appearing or diaphoretic.  HENT:     Head: Normocephalic and atraumatic.  Eyes:     General: No scleral icterus.  Right eye: No discharge.        Left eye: No discharge.     Conjunctiva/sclera: Conjunctivae normal.  Cardiovascular:     Rate and Rhythm: Normal rate.  Pulmonary:     Effort: Pulmonary effort is normal. No respiratory distress.  Musculoskeletal:        General: Normal range of motion.     Cervical back: Normal range of motion.     Lumbar back: Bony tenderness present. No swelling, edema, deformity, signs of trauma, lacerations or spasms. Normal range of motion. No scoliosis.  Skin:    General: Skin is warm and dry.  Neurological:     Mental Status: He is alert and oriented to person, place, and time. Mental status is at baseline.   Psychiatric:        Mood and Affect: Mood normal.        Behavior: Behavior normal.        Thought Content: Thought content normal.        Judgment: Judgment normal.

## 2019-09-02 NOTE — Patient Instructions (Signed)

## 2019-09-07 ENCOUNTER — Encounter (HOSPITAL_COMMUNITY): Payer: Medicaid Other

## 2019-09-09 ENCOUNTER — Encounter (HOSPITAL_COMMUNITY): Payer: Medicaid Other

## 2019-09-13 ENCOUNTER — Ambulatory Visit: Payer: Medicaid Other | Admitting: Orthopedic Surgery

## 2019-09-14 ENCOUNTER — Other Ambulatory Visit: Payer: Self-pay

## 2019-09-14 ENCOUNTER — Ambulatory Visit (INDEPENDENT_AMBULATORY_CARE_PROVIDER_SITE_OTHER): Payer: Medicaid Other

## 2019-09-14 ENCOUNTER — Encounter: Payer: Self-pay | Admitting: Orthopedic Surgery

## 2019-09-14 ENCOUNTER — Ambulatory Visit (INDEPENDENT_AMBULATORY_CARE_PROVIDER_SITE_OTHER): Payer: Medicaid Other | Admitting: Orthopedic Surgery

## 2019-09-14 VITALS — BP 147/99 | HR 92 | Ht 71.0 in | Wt 250.0 lb

## 2019-09-14 DIAGNOSIS — M1711 Unilateral primary osteoarthritis, right knee: Secondary | ICD-10-CM | POA: Diagnosis not present

## 2019-09-14 DIAGNOSIS — G8929 Other chronic pain: Secondary | ICD-10-CM

## 2019-09-14 DIAGNOSIS — M25561 Pain in right knee: Secondary | ICD-10-CM | POA: Diagnosis not present

## 2019-09-14 MED ORDER — SULINDAC 150 MG PO TABS: 150.0000 mg | ORAL_TABLET | Freq: Two times a day (BID) | ORAL | 1 refills | Status: DC

## 2019-09-14 MED ORDER — HYDROCODONE-ACETAMINOPHEN 5-325 MG PO TABS: 1.0000 | ORAL_TABLET | Freq: Four times a day (QID) | ORAL | 0 refills | Status: DC | PRN

## 2019-09-14 NOTE — Progress Notes (Signed)
Chief Complaint  Patient presents with  . Knee Pain    right / all the time especially with walking     Status post arthroscopy of the right knee June 2020 followed by ACL reconstruction May 15, 2019  Patient has arthritis of the knee he had an ACL reconstruction with allograft is having pain over the hardware medially and then he is having anterior lateral knee pain from arthritis documented by MRI and x-ray  He has a constant aching pain in the anterior portion of the knee and on the lateral joint line.  He still has a limp a catching and aching sensation in the joint at night especially when he turns over on his stomach  He has had ibuprofen Mobic which caused his blood pressure to be elevated he is wary of taking any opioid medication he has been braced still continues to have pain in his right knee and is quality of life is very poor at this time.  Repeat radiographs were taken with AP flexion view which shows the advanced arthritis in the lateral compartment the prominent hardware on the medial tibia  Cyst noted in the tibial tunnel  Physical Exam Constitutional:      Appearance: He is well-developed.  HENT:     Head: Normocephalic and atraumatic.  Eyes:     Conjunctiva/sclera: Conjunctivae normal.     Pupils: Pupils are equal, round, and reactive to light.  Cardiovascular:     Rate and Rhythm: Normal rate and regular rhythm.  Pulmonary:     Effort: Pulmonary effort is normal.  Abdominal:     Palpations: Abdomen is soft.  Musculoskeletal:     Cervical back: Normal range of motion and neck supple.     Right knee: Effusion and bony tenderness present. No swelling, deformity, erythema, ecchymosis or lacerations. Normal range of motion. Tenderness present over the lateral joint line. No LCL laxity, MCL laxity, ACL laxity or PCL laxity. Normal pulse.       Legs:  Skin:    General: Skin is warm and dry.  Neurological:     Mental Status: He is alert and oriented to  person, place, and time.     Cranial Nerves: No cranial nerve deficit.     Motor: No abnormal muscle tone.     Coordination: Coordination normal.     Deep Tendon Reflexes: Reflexes are normal and symmetric. Reflexes normal.  Psychiatric:        Behavior: Behavior normal.        Thought Content: Thought content normal.        Judgment: Judgment normal.      Encounter Diagnoses  Name Primary?  . Primary osteoarthritis of right knee Yes  . Chronic pain of right knee    Chronic illness with exacerbation prescription management complications from management and decision for surgery with minimal complications  We went over several options.  Is difficult at this point to make any definitive recommendation he is 44 years old he has significant degenerative arthritis in his knees not responded to any nonoperative measures  I recommended we remove his hardware medially and I changed to his medications to see if that would give him any better pain relief  Meds ordered this encounter  Medications  . sulindac (CLINORIL) 150 MG tablet    Sig: Take 1 tablet (150 mg total) by mouth 2 (two) times daily.    Dispense:  60 tablet    Refill:  1  . HYDROcodone-acetaminophen (NORCO/VICODIN) 5-325  MG tablet    Sig: Take 1 tablet by mouth every 6 (six) hours as needed for moderate pain.    Dispense:  30 tablet    Refill:  0

## 2019-09-14 NOTE — Patient Instructions (Signed)
As we discussed you have arthritis in the knee we are going to remove the painful hardware.  I started you on 2 new medications let us know if there causing any new issues  The surgery will be done and we will arrange a 1 week or so follow-up

## 2019-09-15 NOTE — Patient Instructions (Signed)
Victor [name]  09/15/2019     @PREFPERIOPPHARMACY @   Your procedure is scheduled on  08/20/2020  Report to [facility] at  [id number]  A.M.  Call this number if you have problems the morning of surgery:  [phone]   Remember:  Do not eat or drink after midnight.                        Take these medicines the morning of surgery with A SIP OF WATER  Amlodipine, hydrocodone(if needed), sulindac.    Do not wear jewelry, make-up or nail polish.  Do not wear lotions, powders, or perfumes. Please wear deodorant and brush your teeth.  Do not shave 48 hours prior to surgery.  Men Rabe shave face and neck.  Do not bring valuables to the hospital.  [facility] is not responsible for any belongings or valuables.  Contacts, dentures or bridgework Amundson not be worn into surgery.  Leave your suitcase in the car.  After surgery it Aven be brought to your room.  For patients admitted to the hospital, discharge time will be determined by your treatment team.  Patients discharged the day of surgery will not be allowed to drive home.   Name and phone number of your driver:   family Special instructions:  None  Please read over the following fact sheets that you were given. Anesthesia Post-op Instructions and Care and Recovery After Surgery       Orthopedic Hardware Removal, Care After This sheet gives you information about how to care for yourself after your procedure. Your health care provider Werts also give you more specific instructions. If you have problems or questions, contact your health care provider. What can I expect after the procedure? After the procedure, it is common to have:  Soreness or pain.  Some swelling in the area where the hardware was removed.  A small amount of blood or clear fluid coming from your incision. Follow these instructions at home: If you have a cast:  Do not stick anything inside the cast to scratch your skin. Doing that increases your risk of  infection.  Check the skin around the cast every day. Tell your health care provider about any concerns.  You Lengel put lotion on dry skin around the edges of the cast. Do not put lotion on the skin underneath the cast.  Keep the cast clean and dry. If you have a splint or boot:  Wear the splint or boot as told by your health care provider. Remove it only as told by your health care provider.  Loosen the splint or boot if your fingers or toes tingle, become numb, or turn cold and blue.  Keep the splint or boot clean and dry. Bathing  Do not take baths, swim, or use a hot tub until your health care provider approves. Ask your health care provider if you Berntsen take showers. You Portillo only be allowed to take sponge baths.  Keep the bandage (dressing) dry until your health care provider says it can be removed.  If your cast, splint, or boot is not waterproof: ? Do not let it get wet. ? Cover it with a watertight covering when you take a bath or a shower. Incision care   Follow instructions from your health care provider about how to take care of your incision. Make sure you: ? Wash your hands with soap and water before  you change your dressing. If soap and water are not available, use hand sanitizer. ? Change your dressing as told by your health care provider. ? Leave stitches (sutures), skin glue, or adhesive strips in place. These skin closures Gartrell need to stay in place for 2 weeks or longer. If adhesive strip edges start to loosen and curl up, you Fonda trim the loose edges. Do not remove adhesive strips completely unless your health care provider tells you to do that.  Check your incision area every day for signs of infection. Check for: ? Redness. ? More swelling or pain. ? More fluid or blood. ? Warmth. ? Pus or a bad smell. Managing pain, stiffness, and swelling   If directed, put ice on the affected area: ? If you have a removable splint or boot, remove it as told by your health  care provider. ? Put ice in a plastic bag. ? Place a towel between your skin and the bag. ? Leave the ice on for 20 minutes, 2-3 times a day.  Move your fingers or toes often to avoid stiffness and to lessen swelling.  Raise (elevate) the injured area above the level of your heart while you are sitting or lying down. Driving  Do not drive or use heavy machinery while taking prescription pain medicine.  Do not drive for 24 hours if you were given a medicine to help you relax (sedative) during your procedure.  Ask your health care provider when it is safe to drive if you have a cast, splint, or boot on the affected limb. Activity  Ask your health care provider what activities are safe for you during recovery, and ask what activities you need to avoid.  Do not use the injured limb to support your body weight until your health care provider says that you can.  Do not play contact sports until your health care provider approves.  Do exercises as told by your health care provider.  Avoid sitting for a long time without moving. Get up and move around at least every few hours. This will help prevent blood clots. General instructions  Do not put pressure on any part of the cast or splint until it is fully hardened. This Reifschneider take several hours.  If you are taking prescription pain medicine, take actions to prevent or treat constipation. Your health care provider Lipson recommend that you: ? Drink enough fluid to keep your urine pale yellow. ? Eat foods that are high in fiber, such as fresh fruits and vegetables, whole grains, and beans. ? Limit foods that are high in fat and processed sugars, such as fried or sweet foods. ? Take an over-the-counter or prescription medicine for constipation.  Do not use any products that contain nicotine or tobacco, such as cigarettes and e-cigarettes. These can delay bone healing after surgery. If you need help quitting, ask your health care provider.  Take  over-the-counter and prescription medicines only as told by your health care provider.  Keep all follow-up visits as told by your health care provider. This is important. Contact a health care provider if:  You have lasting pain.  You have redness around your incision.  You have more swelling or pain around your incision.  You have more fluid or blood coming from your incision.  Your incision feels warm to the touch.  You have pus or a bad smell coming from your incision.  You are unable to do exercises or physical activity as told by your health  care provider. Get help right away if:  You have difficulty breathing.  You have chest pain.  You have severe pain.  You have a fever or chills.  You have numbness for more than 24 hours in the area where the hardware was removed. Summary  After the procedure, it is common to have some pain and swelling in the area where the hardware was removed.  Follow instructions from your health care provider about how to take care of your incision.  Return to your normal activities as told by your health care provider. Ask your health care provider what activities are safe for you. This information is not intended to replace advice given to you by your health care provider. Make sure you discuss any questions you have with your health care provider. Document Revised: 10/08/2018 Document Reviewed: 09/04/2017 Elsevier Patient Education  2020 [place] Anesthesia, Adult, Care After This sheet gives you information about how to care for yourself after your procedure. Your health care provider Ellegood also give you more specific instructions. If you have problems or questions, contact your health care provider. What can I expect after the procedure? After the procedure, the following side effects are common:  Pain or discomfort at the IV site.  Nausea.  Vomiting.  Sore throat.  Trouble concentrating.  Feeling cold or  chills.  Weak or tired.  Sleepiness and fatigue.  Soreness and body aches. These side effects can affect parts of the body that were not involved in surgery. Follow these instructions at home:  For at least 24 hours after the procedure:  Have a responsible adult stay with you. It is important to have someone help care for you until you are awake and alert.  Rest as needed.  Do not: ? Participate in activities in which you could fall or become injured. ? Drive. ? Use heavy machinery. ? Drink alcohol. ? Take sleeping pills or medicines that cause drowsiness. ? Make important decisions or sign legal documents. ? Take care of children on your own. Eating and drinking  Follow any instructions from your health care provider about eating or drinking restrictions.  When you feel hungry, start by eating small amounts of foods that are soft and easy to digest (bland), such as toast. Gradually return to your regular diet.  Drink enough fluid to keep your urine pale yellow.  If you vomit, rehydrate by drinking water, juice, or clear broth. General instructions  If you have sleep apnea, surgery and certain medicines can increase your risk for breathing problems. Follow instructions from your health care provider about wearing your sleep device: ? Anytime you are sleeping, including during daytime naps. ? While taking prescription pain medicines, sleeping medicines, or medicines that make you drowsy.  Return to your normal activities as told by your health care provider. Ask your health care provider what activities are safe for you.  Take over-the-counter and prescription medicines only as told by your health care provider.  If you smoke, do not smoke without supervision.  Keep all follow-up visits as told by your health care provider. This is important. Contact a health care provider if:  You have nausea or vomiting that does not get better with medicine.  You cannot eat or drink  without vomiting.  You have pain that does not get better with medicine.  You are unable to pass urine.  You develop a skin rash.  You have a fever.  You have redness around your IV site  that gets worse. Get help right away if:  You have difficulty breathing.  You have chest pain.  You have blood in your urine or stool, or you vomit blood. Summary  After the procedure, it is common to have a sore throat or nausea. It is also common to feel tired.  Have a responsible adult stay with you for the first 24 hours after general anesthesia. It is important to have someone help care for you until you are awake and alert.  When you feel hungry, start by eating small amounts of foods that are soft and easy to digest (bland), such as toast. Gradually return to your regular diet.  Drink enough fluid to keep your urine pale yellow.  Return to your normal activities as told by your health care provider. Ask your health care provider what activities are safe for you. This information is not intended to replace advice given to you by your health care provider. Make sure you discuss any questions you have with your health care provider. Document Revised: 08/15/2017 Document Reviewed: 03/28/2017 Elsevier Patient Education  [place]. How to Use Chlorhexidine for Bathing Chlorhexidine gluconate (CHG) is a germ-killing (antiseptic) solution that is used to clean the skin. It can get rid of the bacteria that normally live on the skin and can keep them away for about 24 hours. To clean your skin with CHG, you Guimond be given:  A CHG solution to use in the shower or as part of a sponge bath.  A prepackaged cloth that contains CHG. Cleaning your skin with CHG Felber help lower the risk for infection:  While you are staying in the intensive care unit of the hospital.  If you have a vascular access, such as a central line, to provide short-term or long-term access to your veins.  If you have a  catheter to drain urine from your bladder.  If you are on a ventilator. A ventilator is a machine that helps you breathe by moving air in and out of your lungs.  After surgery. What are the risks? Risks of using CHG include:  A skin reaction.  Hearing loss, if CHG gets in your ears.  Eye injury, if CHG gets in your eyes and is not rinsed out.  The CHG product catching fire. Make sure that you avoid smoking and flames after applying CHG to your skin. Do not use CHG:  If you have a chlorhexidine allergy or have previously reacted to chlorhexidine.  On babies younger than 39 months of age. How to use CHG solution  Use CHG only as told by your health care provider, and follow the instructions on the label.  Use the full amount of CHG as directed. Usually, this is one bottle. During a shower Follow these steps when using CHG solution during a shower (unless your health care provider gives you different instructions): 1. Start the shower. 2. Use your normal soap and shampoo to wash your face and hair. 3. Turn off the shower or move out of the shower stream. 4. Pour the CHG onto a clean washcloth. Do not use any type of brush or rough-edged sponge. 5. Starting at your neck, lather your body down to your toes. Make sure you follow these instructions: ? If you will be having surgery, pay special attention to the part of your body where you will be having surgery. Scrub this area for at least 1 minute. ? Do not use CHG on your head or face. If  the solution gets into your ears or eyes, rinse them well with water. ? Avoid your genital area. ? Avoid any areas of skin that have broken skin, cuts, or scrapes. ? Scrub your back and under your arms. Make sure to wash skin folds. 6. Let the lather sit on your skin for 1-2 minutes or as long as told by your health care provider. 7. Thoroughly rinse your entire body in the shower. Make sure that all body creases and crevices are rinsed  well. 8. Dry off with a clean towel. Do not put any substances on your body afterward--such as powder, lotion, or perfume--unless you are told to do so by your health care provider. Only use lotions that are recommended by the manufacturer. 9. Put on clean clothes or pajamas. 10. If it is the night before your surgery, sleep in clean sheets.  During a sponge bath Follow these steps when using CHG solution during a sponge bath (unless your health care provider gives you different instructions): 1. Use your normal soap and shampoo to wash your face and hair. 2. Pour the CHG onto a clean washcloth. 3. Starting at your neck, lather your body down to your toes. Make sure you follow these instructions: ? If you will be having surgery, pay special attention to the part of your body where you will be having surgery. Scrub this area for at least 1 minute. ? Do not use CHG on your head or face. If the solution gets into your ears or eyes, rinse them well with water. ? Avoid your genital area. ? Avoid any areas of skin that have broken skin, cuts, or scrapes. ? Scrub your back and under your arms. Make sure to wash skin folds. 4. Let the lather sit on your skin for 1-2 minutes or as long as told by your health care provider. 5. Using a different clean, wet washcloth, thoroughly rinse your entire body. Make sure that all body creases and crevices are rinsed well. 6. Dry off with a clean towel. Do not put any substances on your body afterward--such as powder, lotion, or perfume--unless you are told to do so by your health care provider. Only use lotions that are recommended by the manufacturer. 7. Put on clean clothes or pajamas. 8. If it is the night before your surgery, sleep in clean sheets. How to use CHG prepackaged cloths  Only use CHG cloths as told by your health care provider, and follow the instructions on the label.  Use the CHG cloth on clean, dry skin.  Do not use the CHG cloth on your head  or face unless your health care provider tells you to.  When washing with the CHG cloth: ? Avoid your genital area. ? Avoid any areas of skin that have broken skin, cuts, or scrapes. Before surgery Follow these steps when using a CHG cloth to clean before surgery (unless your health care provider gives you different instructions): 1. Using the CHG cloth, vigorously scrub the part of your body where you will be having surgery. Scrub using a back-and-forth motion for 3 minutes. The area on your body should be completely wet with CHG when you are done scrubbing. 2. Do not rinse. Discard the cloth and let the area air-dry. Do not put any substances on the area afterward, such as powder, lotion, or perfume. 3. Put on clean clothes or pajamas. 4. If it is the night before your surgery, sleep in clean sheets.  For general bathing Follow  these steps when using CHG cloths for general bathing (unless your health care provider gives you different instructions). 1. Use a separate CHG cloth for each area of your body. Make sure you wash between any folds of skin and between your fingers and toes. Wash your body in the following order, switching to a new cloth after each step: ? The front of your neck, shoulders, and chest. ? Both of your arms, under your arms, and your hands. ? Your stomach and groin area, avoiding the genitals. ? Your right leg and foot. ? Your left leg and foot. ? The back of your neck, your back, and your buttocks. 2. Do not rinse. Discard the cloth and let the area air-dry. Do not put any substances on your body afterward--such as powder, lotion, or perfume--unless you are told to do so by your health care provider. Only use lotions that are recommended by the manufacturer. 3. Put on clean clothes or pajamas. Contact a health care provider if:  Your skin gets irritated after scrubbing.  You have questions about using your solution or cloth. Get help right away if:  Your eyes  become very red or swollen.  Your eyes itch badly.  Your skin itches badly and is red or swollen.  Your hearing changes.  You have trouble seeing.  You have swelling or tingling in your mouth or throat.  You have trouble breathing.  You swallow any chlorhexidine. Summary  Chlorhexidine gluconate (CHG) is a germ-killing (antiseptic) solution that is used to clean the skin. Cleaning your skin with CHG Colligan help to lower your risk for infection.  You Whitmore be given CHG to use for bathing. It Mogel be in a bottle or in a prepackaged cloth to use on your skin. Carefully follow your health care provider's instructions and the instructions on the product label.  Do not use CHG if you have a chlorhexidine allergy.  Contact your health care provider if your skin gets irritated after scrubbing. This information is not intended to replace advice given to you by your health care provider. Make sure you discuss any questions you have with your health care provider. Document Revised: 10/29/2018 Document Reviewed: 07/10/2017 Elsevier Patient Education  [place].

## 2019-09-15 NOTE — Progress Notes (Signed)
ok 

## 2019-09-17 ENCOUNTER — Encounter (HOSPITAL_COMMUNITY)
Admission: RE | Admit: 2019-09-17 | Discharge: 2019-09-17 | Disposition: A | Discharge status: Home / Self Care | Payer: Medicaid Other | Source: Ambulatory Visit | Attending: Orthopedic Surgery | Admitting: Orthopedic Surgery

## 2019-09-17 ENCOUNTER — Other Ambulatory Visit: Payer: Self-pay

## 2019-09-17 ENCOUNTER — Encounter (HOSPITAL_COMMUNITY): Payer: Self-pay

## 2019-09-17 ENCOUNTER — Other Ambulatory Visit (HOSPITAL_COMMUNITY)
Admission: RE | Admit: 2019-09-17 | Discharge: 2019-09-17 | Disposition: A | Discharge status: Home / Self Care | Payer: Medicaid Other | Source: Ambulatory Visit | Attending: Orthopedic Surgery | Admitting: Orthopedic Surgery

## 2019-09-17 DIAGNOSIS — Z20822 Contact with and (suspected) exposure to covid-19: Secondary | ICD-10-CM | POA: Insufficient documentation

## 2019-09-17 DIAGNOSIS — T8484XA Pain due to internal orthopedic prosthetic devices, implants and grafts, initial encounter: Secondary | ICD-10-CM | POA: Insufficient documentation

## 2019-09-17 DIAGNOSIS — Z01812 Encounter for preprocedural laboratory examination: Secondary | ICD-10-CM | POA: Diagnosis not present

## 2019-09-17 LAB — CBC WITH DIFFERENTIAL/PLATELET
Abs Immature Granulocytes: 0.03 10*3/uL (ref 0.00–0.07)
Basophils Absolute: 0.1 10*3/uL (ref 0.0–0.1)
Basophils Relative: 1 %
Eosinophils Absolute: 0.1 10*3/uL (ref 0.0–0.5)
Eosinophils Relative: 1 %
HCT: 47 % (ref 39.0–52.0)
Hemoglobin: 15.6 g/dL (ref 13.0–17.0)
Immature Granulocytes: 0 %
Lymphocytes Relative: 33 %
Lymphs Abs: 2.8 10*3/uL (ref 0.7–4.0)
MCH: 29.7 pg (ref 26.0–34.0)
MCHC: 33.2 g/dL (ref 30.0–36.0)
MCV: 89.4 fL (ref 80.0–100.0)
Monocytes Absolute: 0.6 10*3/uL (ref 0.1–1.0)
Monocytes Relative: 7 %
Neutro Abs: 4.9 10*3/uL (ref 1.7–7.7)
Neutrophils Relative %: 58 %
Platelets: 266 10*3/uL (ref 150–400)
RBC: 5.26 MIL/uL (ref 4.22–5.81)
RDW: 13.4 % (ref 11.5–15.5)
WBC: 8.5 10*3/uL (ref 4.0–10.5)
nRBC: 0 % (ref 0.0–0.2)

## 2019-09-17 LAB — BASIC METABOLIC PANEL
Anion gap: 11 (ref 5–15)
BUN: 16 mg/dL (ref 6–20)
CO2: 27 mmol/L (ref 22–32)
Calcium: 9.1 mg/dL (ref 8.9–10.3)
Chloride: 100 mmol/L (ref 98–111)
Creatinine, Ser: 1.01 mg/dL (ref 0.61–1.24)
GFR calc Af Amer: >60 mL/min (ref >60)
GFR calc non Af Amer: >60 mL/min (ref >60)
Glucose, Bld: 88 mg/dL (ref 70–99)
Potassium: 3.7 mmol/L (ref 3.5–5.1)
Sodium: 138 mmol/L (ref 135–145)

## 2019-09-17 LAB — SARS CORONAVIRUS 2 (TAT 6-24 HRS): SARS Coronavirus 2: NEGATIVE

## 2019-09-20 NOTE — H&P (Signed)
Chief Complaint  Patient presents with  . Knee Pain      right / all the time especially with walking       Status post arthroscopy of the right knee June 2020 followed by ACL reconstruction May 15, 2019   Patient has arthritis of the knee he had an ACL reconstruction with allograft is having pain over the hardware medially and then he is having anterior lateral knee pain from arthritis documented by MRI and x-ray   He has a constant aching pain in the anterior portion of the knee and on the lateral joint line.  He still has a limp a catching and aching sensation in the joint at night especially when he turns over on his stomach   He has had ibuprofen Mobic which caused his blood pressure to be elevated he is wary of taking any opioid medication he has been braced still continues to have pain in his right knee and is quality of life is very poor at this time.   Repeat radiographs were taken with [facility] flexion view which shows the advanced arthritis in the lateral compartment the prominent hardware on the medial tibia   Cyst noted in the tibial tunnel.pmh Past Surgical History:  Procedure Laterality Date  . ANTERIOR CRUCIATE LIGAMENT REPAIR Right 05/19/2018   Procedure: RIGHT KNEE ARTHROSCOPY WITH ANTERIOR CRUCIATE LIGAMENT (ACL) REPAIR and MEDIAL MENISECTOMY;  Surgeon: [name], MD;  Location: [facility];  Service: Orthopedics;  Laterality: Right;  . APPENDECTOMY    . KNEE ARTHROSCOPY WITH MEDIAL MENISECTOMY Right 05/29/2017   Procedure: KNEE ARTHROSCOPY WITH MEDIAL MENISECTOMY and lateral meniscectomy;  Surgeon: [name], MD;  Location: [facility];  Service: Orthopedics;  Laterality: Right;  . KNEE ARTHROSCOPY WITH MEDIAL MENISECTOMY Right 02/05/2018   Procedure: RIGHT KNEE ARTHROSCOPY WITH PARTIAL MEDIAL AND LATERAL MENISECTOMY;  Surgeon: [name], MD;  Location: [facility];  Service: Orthopedics;  Laterality: Right;   Social History   Tobacco Use  . Smoking  status: Former Smoker    Packs/day: 0.50    Years: 15.00    Pack years: 7.50    Types: Cigarettes    Start date: 07/07/1991    Quit date: 04/09/2018    Years since quitting: 1.4  . Smokeless tobacco: Current User    Types: Chew  . Tobacco comment: quit a couple months ago  Substance Use Topics  . Alcohol use: No  . Drug use: No   Family History  Problem Relation Age of Onset  . Diabetes Mother   . Heart failure Mother   . Cancer Mother   . Pancreatic cancer Mother   . Cancer Father   . Throat cancer Father   . Healthy Sister   . Head & neck cancer Brother   . Bone cancer Maternal Grandmother   . Diabetes Maternal Grandmother   . CAD Maternal Grandfather   . Brain cancer Maternal Grandfather   . Colon cancer Paternal Grandfather    No current facility-administered medications for this encounter.  Current Outpatient Medications:  .  amLODipine (NORVASC) 10 MG tablet, Take 1 tablet (10 mg total) by mouth daily., Disp: 180 tablet, Rfl: 3 .  atorvastatin (LIPITOR) 80 MG tablet, Take 1 tablet (80 mg total) by mouth daily., Disp: 90 tablet, Rfl: 3 .  HYDROcodone-acetaminophen (NORCO/VICODIN) 5-325 MG tablet, Take 1 tablet by mouth every 6 (six) hours as needed for moderate pain., Disp: 30 tablet, Rfl: 0 .  ibuprofen (ADVIL) 800 MG tablet,  Take 1 tablet (800 mg total) by mouth every 8 (eight) hours as needed. (Patient taking differently: Take 800 mg by mouth every 8 (eight) hours as needed (pain.). ), Disp: 30 tablet, Rfl: 0 .  sulindac (CLINORIL) 150 MG tablet, Take 1 tablet (150 mg total) by mouth 2 (two) times daily., Disp: 60 tablet, Rfl: 1    Physical Exam Constitutional:      Appearance: He is well-developed.  HENT:     Head: Normocephalic and atraumatic.  Eyes:     Conjunctiva/sclera: Conjunctivae normal.     Pupils: Pupils are equal, round, and reactive to light.  Cardiovascular:     Rate and Rhythm: Normal rate and regular rhythm.  Pulmonary:     Effort: Pulmonary  effort is normal.  Abdominal:     Palpations: Abdomen is soft.  Musculoskeletal:     Cervical back: Normal range of motion and neck supple.     Right knee: Effusion and bony tenderness present. No swelling, deformity, erythema, ecchymosis or lacerations. Normal range of motion. Tenderness present over the lateral joint line. No LCL laxity, MCL laxity, ACL laxity or PCL laxity. Normal pulse.       Legs:  Skin:    General: Skin is warm and dry.  Neurological:     Mental Status: He is alert and oriented to person, place, and time.     Cranial Nerves: No cranial nerve deficit.     Motor: No abnormal muscle tone.     Coordination: Coordination normal.     Deep Tendon Reflexes: Reflexes are normal and symmetric. Reflexes normal.  Psychiatric:        Behavior: Behavior normal.        Thought Content: Thought content normal.        Judgment: Judgment normal.              Encounter Diagnoses  Name Primary?  . Primary osteoarthritis of right knee Yes  . Chronic pain of right knee      Chronic illness with exacerbation prescription management complications from management and decision for surgery with minimal complications   We went over several options.  Is difficult at this point to make any definitive recommendation he is 43 years old he has significant degenerative arthritis in his knees not responded to any nonoperative measures  In the short-term we are going to remove the staple/hardware from the right knee  Hardware removal right knee Richard staple

## 2019-09-21 ENCOUNTER — Other Ambulatory Visit: Payer: Self-pay

## 2019-09-21 ENCOUNTER — Ambulatory Visit (HOSPITAL_COMMUNITY): Payer: Medicaid Other | Admitting: Anesthesiology

## 2019-09-21 ENCOUNTER — Encounter (HOSPITAL_COMMUNITY): Payer: Self-pay | Admitting: Orthopedic Surgery

## 2019-09-21 ENCOUNTER — Other Ambulatory Visit: Payer: Self-pay | Admitting: Orthopedic Surgery

## 2019-09-21 ENCOUNTER — Encounter (HOSPITAL_COMMUNITY)
Admission: RE | Disposition: A | Discharge status: Home / Self Care | Payer: Self-pay | Source: Home / Self Care | Attending: Orthopedic Surgery

## 2019-09-21 ENCOUNTER — Ambulatory Visit (HOSPITAL_COMMUNITY)
Admission: RE | Admit: 2019-09-21 | Discharge: 2019-09-21 | Disposition: A | Discharge status: Home / Self Care | Payer: Medicaid Other | Attending: Orthopedic Surgery | Admitting: Orthopedic Surgery

## 2019-09-21 DIAGNOSIS — M1711 Unilateral primary osteoarthritis, right knee: Secondary | ICD-10-CM

## 2019-09-21 DIAGNOSIS — Z87891 Personal history of nicotine dependence: Secondary | ICD-10-CM | POA: Diagnosis not present

## 2019-09-21 DIAGNOSIS — Z79899 Other long term (current) drug therapy: Secondary | ICD-10-CM | POA: Diagnosis not present

## 2019-09-21 DIAGNOSIS — Z8249 Family history of ischemic heart disease and other diseases of the circulatory system: Secondary | ICD-10-CM | POA: Diagnosis not present

## 2019-09-21 DIAGNOSIS — I10 Essential (primary) hypertension: Secondary | ICD-10-CM | POA: Diagnosis not present

## 2019-09-21 DIAGNOSIS — T8484XA Pain due to internal orthopedic prosthetic devices, implants and grafts, initial encounter: Secondary | ICD-10-CM | POA: Diagnosis not present

## 2019-09-21 DIAGNOSIS — Y828 Other medical devices associated with adverse incidents: Secondary | ICD-10-CM | POA: Insufficient documentation

## 2019-09-21 DIAGNOSIS — G8929 Other chronic pain: Secondary | ICD-10-CM

## 2019-09-21 DIAGNOSIS — T85848A Pain due to other internal prosthetic devices, implants and grafts, initial encounter: Secondary | ICD-10-CM | POA: Insufficient documentation

## 2019-09-21 DIAGNOSIS — T8484XD Pain due to internal orthopedic prosthetic devices, implants and grafts, subsequent encounter: Secondary | ICD-10-CM | POA: Diagnosis not present

## 2019-09-21 HISTORY — PX: HARDWARE REMOVAL: SHX979

## 2019-09-21 SURGERY — REMOVAL, HARDWARE
Anesthesia: General | Laterality: Right

## 2019-09-21 MED ORDER — HYDROCODONE-ACETAMINOPHEN 5-325 MG PO TABS: 1.0000 | ORAL_TABLET | Freq: Four times a day (QID) | ORAL | 0 refills | Status: AC | PRN

## 2019-09-21 MED ORDER — CEFAZOLIN SODIUM-DEXTROSE 2-4 GM/100ML-% IV SOLN
2.0000 g | INTRAVENOUS | Status: AC
  Administered 2019-09-21: 10:00:00 2 g via INTRAVENOUS

## 2019-09-21 MED ORDER — BUPIVACAINE-EPINEPHRINE (PF) 0.25% -1:200000 IJ SOLN
INTRAMUSCULAR | Status: AC
  Filled 2019-09-21: qty 60

## 2019-09-21 MED ORDER — MIDAZOLAM HCL 2 MG/2ML IJ SOLN
INTRAMUSCULAR | Status: AC
  Filled 2019-09-21: qty 2

## 2019-09-21 MED ORDER — CEFAZOLIN SODIUM-DEXTROSE 2-4 GM/100ML-% IV SOLN
INTRAVENOUS | Status: AC
  Filled 2019-09-21: qty 100

## 2019-09-21 MED ORDER — LIDOCAINE 2% (20 MG/ML) 5 ML SYRINGE
INTRAMUSCULAR | Status: AC
  Filled 2019-09-21: qty 5

## 2019-09-21 MED ORDER — PROPOFOL 10 MG/ML IV BOLUS
INTRAVENOUS | Status: AC
  Filled 2019-09-21: qty 20

## 2019-09-21 MED ORDER — FENTANYL CITRATE (PF) 250 MCG/5ML IJ SOLN
INTRAMUSCULAR | Status: AC
  Filled 2019-09-21: qty 5

## 2019-09-21 MED ORDER — MIDAZOLAM HCL 5 MG/5ML IJ SOLN
INTRAMUSCULAR | Status: DC | PRN
  Administered 2019-09-21: 2 mg via INTRAVENOUS

## 2019-09-21 MED ORDER — LIDOCAINE HCL (CARDIAC) PF 50 MG/5ML IV SOSY
PREFILLED_SYRINGE | INTRAVENOUS | Status: DC | PRN
  Administered 2019-09-21: 60 mg via INTRAVENOUS

## 2019-09-21 MED ORDER — SUGAMMADEX SODIUM 200 MG/2ML IV SOLN
INTRAVENOUS | Status: DC | PRN
  Administered 2019-09-21: 300 mg via INTRAVENOUS

## 2019-09-21 MED ORDER — BUPIVACAINE HCL (PF) 0.5 % IJ SOLN
INTRAMUSCULAR | Status: AC
  Filled 2019-09-21: qty 60

## 2019-09-21 MED ORDER — ONDANSETRON HCL 4 MG/2ML IJ SOLN
INTRAMUSCULAR | Status: DC | PRN
  Administered 2019-09-21: 4 mg via INTRAVENOUS

## 2019-09-21 MED ORDER — ROCURONIUM 10MG/ML (10ML) SYRINGE FOR MEDFUSION PUMP - OPTIME
INTRAVENOUS | Status: DC | PRN
  Administered 2019-09-21: 10 mg via INTRAVENOUS

## 2019-09-21 MED ORDER — HYDROCODONE-ACETAMINOPHEN 7.5-325 MG PO TABS
1.0000 | ORAL_TABLET | Freq: Once | ORAL | Status: AC
  Administered 2019-09-21: 12:00:00 1 via ORAL
  Filled 2019-09-21: qty 1

## 2019-09-21 MED ORDER — SUCCINYLCHOLINE 20MG/ML (10ML) SYRINGE FOR MEDFUSION PUMP - OPTIME
INTRAMUSCULAR | Status: DC | PRN
  Administered 2019-09-21: 140 mg via INTRAVENOUS

## 2019-09-21 MED ORDER — SUCCINYLCHOLINE CHLORIDE 200 MG/10ML IV SOSY
PREFILLED_SYRINGE | INTRAVENOUS | Status: AC
  Filled 2019-09-21: qty 10

## 2019-09-21 MED ORDER — HYDROMORPHONE HCL 1 MG/ML IJ SOLN
0.2500 mg | INTRAMUSCULAR | Status: DC | PRN
  Administered 2019-09-21: 11:00:00 0.5 mg via INTRAVENOUS
  Filled 2019-09-21: qty 0.5

## 2019-09-21 MED ORDER — SODIUM CHLORIDE 0.9 % IR SOLN
Status: DC | PRN
  Administered 2019-09-21: 1000 mL

## 2019-09-21 MED ORDER — BUPIVACAINE HCL (PF) 0.5 % IJ SOLN
INTRAMUSCULAR | Status: DC | PRN
  Administered 2019-09-21: 20 mL

## 2019-09-21 MED ORDER — LACTATED RINGERS IV SOLN
INTRAVENOUS | Status: DC
  Administered 2019-09-21: 08:00:00 1000 mL via INTRAVENOUS

## 2019-09-21 MED ORDER — ONDANSETRON HCL 4 MG/2ML IJ SOLN
INTRAMUSCULAR | Status: AC
  Filled 2019-09-21: qty 2

## 2019-09-21 MED ORDER — PROMETHAZINE HCL 25 MG/ML IJ SOLN
6.2500 mg | INTRAMUSCULAR | Status: DC | PRN
  Administered 2019-09-21: 12.5 mg via INTRAVENOUS
  Filled 2019-09-21: qty 1

## 2019-09-21 MED ORDER — PROPOFOL 10 MG/ML IV BOLUS
INTRAVENOUS | Status: DC | PRN
  Administered 2019-09-21: 200 mg via INTRAVENOUS

## 2019-09-21 MED ORDER — ONDANSETRON HCL 4 MG/2ML IJ SOLN
4.0000 mg | Freq: Once | INTRAMUSCULAR | Status: AC
  Administered 2019-09-21: 12:00:00 4 mg via INTRAVENOUS
  Filled 2019-09-21: qty 2

## 2019-09-21 MED ORDER — PROMETHAZINE HCL 25 MG PO TABS: 25.0000 mg | ORAL_TABLET | Freq: Four times a day (QID) | ORAL | 1 refills | Status: DC | PRN

## 2019-09-21 MED ORDER — CHLORHEXIDINE GLUCONATE 4 % EX LIQD: 60.0000 mL | Freq: Once | CUTANEOUS | Status: DC

## 2019-09-21 MED ORDER — HYDROCODONE-ACETAMINOPHEN 7.5-325 MG PO TABS: 1.0000 | ORAL_TABLET | Freq: Once | ORAL | Status: DC | PRN

## 2019-09-21 MED ORDER — MIDAZOLAM HCL 2 MG/2ML IJ SOLN: 0.5000 mg | Freq: Once | INTRAMUSCULAR | Status: DC | PRN

## 2019-09-21 MED ORDER — FENTANYL CITRATE (PF) 100 MCG/2ML IJ SOLN
INTRAMUSCULAR | Status: DC | PRN
  Administered 2019-09-21 (×4): 50 ug via INTRAVENOUS

## 2019-09-21 SURGICAL SUPPLY — 46 items
APL PRP STRL LF DISP 70% ISPRP (MISCELLANEOUS) ×1
APL SKNCLS STERI-STRIP NONHPOA (GAUZE/BANDAGES/DRESSINGS) ×1
BANDAGE ESMARK 4X12 BL STRL LF (DISPOSABLE) ×1 IMPLANT
BENZOIN TINCTURE PRP APPL 2/3 (GAUZE/BANDAGES/DRESSINGS) ×2 IMPLANT
BLADE SURG SZ10 CARB STEEL (BLADE) ×3 IMPLANT
BNDG CMPR 12X4 ELC STRL LF (DISPOSABLE) ×1
BNDG CMPR STD VLCR NS LF 5.8X6 (GAUZE/BANDAGES/DRESSINGS) ×1
BNDG ELASTIC 6X5.8 VLCR NS LF (GAUZE/BANDAGES/DRESSINGS) ×2 IMPLANT
BNDG ESMARK 4X12 BLUE STRL LF (DISPOSABLE) ×3
CHLORAPREP W/TINT 26 (MISCELLANEOUS) ×3 IMPLANT
CLOSURE WOUND 1/2 X4 (GAUZE/BANDAGES/DRESSINGS) ×1
CLOTH BEACON ORANGE TIMEOUT ST (SAFETY) ×3 IMPLANT
COVER LIGHT HANDLE STERIS (MISCELLANEOUS) ×6 IMPLANT
COVER WAND RF STERILE (DRAPES) ×5 IMPLANT
CUFF TOURN SGL QUICK 34 (TOURNIQUET CUFF) ×3
CUFF TRNQT CYL 34X4.125X (TOURNIQUET CUFF) ×1 IMPLANT
DECANTER SPIKE VIAL GLASS SM (MISCELLANEOUS) ×5 IMPLANT
ELECT REM PT RETURN 9FT ADLT (ELECTROSURGICAL) ×3
ELECTRODE REM PT RTRN 9FT ADLT (ELECTROSURGICAL) ×1 IMPLANT
GAUZE SPONGE 4X4 12PLY STRL (GAUZE/BANDAGES/DRESSINGS) ×2 IMPLANT
GAUZE XEROFORM 5X9 LF (GAUZE/BANDAGES/DRESSINGS) ×2 IMPLANT
GLOVE BIOGEL M 7.0 STRL (GLOVE) ×2 IMPLANT
GLOVE BIOGEL PI IND STRL 7.0 (GLOVE) ×1 IMPLANT
GLOVE BIOGEL PI INDICATOR 7.0 (GLOVE) ×2
GLOVE SKINSENSE NS SZ8.0 LF (GLOVE) ×2
GLOVE SKINSENSE STRL SZ8.0 LF (GLOVE) ×1 IMPLANT
GLOVE SS N UNI LF 8.5 STRL (GLOVE) ×3 IMPLANT
GOWN STRL REUS W/TWL LRG LVL3 (GOWN DISPOSABLE) ×3 IMPLANT
GOWN STRL REUS W/TWL XL LVL3 (GOWN DISPOSABLE) ×3 IMPLANT
INST SET MINOR BONE (KITS) ×3 IMPLANT
KIT TURNOVER KIT A (KITS) ×3 IMPLANT
MANIFOLD NEPTUNE II (INSTRUMENTS) ×3 IMPLANT
NDL HYPO 21X1.5 SAFETY (NEEDLE) IMPLANT
NEEDLE HYPO 21X1.5 SAFETY (NEEDLE) ×3 IMPLANT
NS IRRIG 1000ML POUR BTL (IV SOLUTION) ×3 IMPLANT
PACK BASIC LIMB (CUSTOM PROCEDURE TRAY) ×3 IMPLANT
PAD ABD 5X9 TENDERSORB (GAUZE/BANDAGES/DRESSINGS) ×2 IMPLANT
PAD ARMBOARD 7.5X6 YLW CONV (MISCELLANEOUS) ×3 IMPLANT
SET BASIN LINEN APH (SET/KITS/TRAYS/PACK) ×3 IMPLANT
SPONGE LAP 18X18 RF (DISPOSABLE) ×5 IMPLANT
STRIP CLOSURE SKIN 1/2X4 (GAUZE/BANDAGES/DRESSINGS) ×1 IMPLANT
SUT MON AB 0 CT1 (SUTURE) ×3 IMPLANT
SUT MON AB 2-0 SH 27 (SUTURE) ×3
SUT MON AB 2-0 SH27 (SUTURE) IMPLANT
SYR 30ML LL (SYRINGE) ×3 IMPLANT
SYR BULB IRRIGATION 50ML (SYRINGE) ×6 IMPLANT

## 2019-09-21 NOTE — Anesthesia Procedure Notes (Signed)
Procedure Name: Intubation Date/Time: 09/21/2019 9:40 AM Performed by: Ollen Bowl, CRNA Pre-anesthesia Checklist: Patient identified, Patient being monitored, Timeout performed, Emergency Drugs available and Suction available Patient Re-evaluated:Patient Re-evaluated prior to induction Oxygen Delivery Method: Circle system utilized Preoxygenation: Pre-oxygenation with 100% oxygen Induction Type: IV induction Ventilation: Mask ventilation without difficulty Laryngoscope Size: Mac and 4 Grade View: Grade I Tube type: Oral Tube size: 7.5 mm Number of attempts: 1 Airway Equipment and Method: Stylet Placement Confirmation: ETT inserted through vocal cords under direct vision,  positive ETCO2 and breath sounds checked- equal and bilateral Secured at: 23 cm Tube secured with: Tape Dental Injury: Teeth and Oropharynx as per pre-operative assessment

## 2019-09-21 NOTE — Transfer of Care (Signed)
Immediate Anesthesia Transfer of Care Note  Patient: Victor [name]  Procedure(s) Performed: HARDWARE REMOVAL (RICHARD'S STAPLE) RIGHT KNEE (Right )  Patient Location: PACU  Anesthesia Type:General  Level of Consciousness: awake and alert   Airway & Oxygen Therapy: Patient Spontanous Breathing  Post-op Assessment: Report given to RN  Post vital signs: Reviewed and stable  Last Vitals:  Vitals Value Taken Time  BP 152/107 09/21/19 1039  Temp    Pulse 93 09/21/19 1041  Resp 16 09/21/19 1041  SpO2 95 % 09/21/19 1041  Vitals shown include unvalidated device data.  Last Pain:  Vitals:   09/21/19 0730  TempSrc: Oral  PainSc:       Patients Stated Pain Goal: 7 ([id number])  Complications: No apparent anesthesia complications

## 2019-09-21 NOTE — Anesthesia Preprocedure Evaluation (Signed)
Anesthesia Evaluation  Patient identified by MRN, date of birth, ID band Patient awake    Reviewed: Allergy & Precautions, NPO status , Patient's Chart, lab work & pertinent test results  Airway Mallampati: I  TM Distance: >3 FB Neck ROM: Full    Dental no notable dental hx. (+) Teeth Intact   Pulmonary neg pulmonary ROS, former smoker,    Pulmonary exam normal breath sounds clear to auscultation       Cardiovascular Exercise Tolerance: Good hypertension, Pt. on medications negative cardio ROS Normal cardiovascular examI Rhythm:Regular Rate:Normal     Neuro/Psych negative neurological ROS  negative psych ROS   GI/Hepatic Neg liver ROS, GERD  Medicated,No chronic GERD meds  Denies GERD Sx today    Endo/Other  negative endocrine ROS  Renal/GU negative Renal ROS  negative genitourinary   Musculoskeletal  (+) Arthritis , Osteoarthritis,    Abdominal   Peds negative pediatric ROS (+)  Hematology negative hematology ROS (+)   Anesthesia Other Findings   Reproductive/Obstetrics negative OB ROS                             Anesthesia Physical Anesthesia Plan  ASA: II  Anesthesia Plan: General   Post-op Pain Management:    Induction: Intravenous  PONV Risk Score and Plan: 2 and Dexamethasone, Ondansetron and Treatment Brabson vary due to age or medical condition  Airway Management Planned: LMA and Oral ETT  Additional Equipment:   Intra-op Plan:   Post-operative Plan: Extubation in OR  Informed Consent: I have reviewed the patients History and Physical, chart, labs and discussed the procedure including the risks, benefits and alternatives for the proposed anesthesia with the patient or authorized representative who has indicated his/her understanding and acceptance.     Dental advisory given  Plan Discussed with: CRNA  Anesthesia Plan Comments: (Plan Full PPE use  Plan GA (LMA)  vs. GETA as needed d/w pt -WTP with same after Q&A)        Anesthesia Quick Evaluation

## 2019-09-21 NOTE — Interval H&P Note (Signed)
History and Physical Interval Note:  09/21/2019 9:03 AM  Victor [name]  has presented today for surgery, with the diagnosis of painful retained hardware right knee [name] staple.  The various methods of treatment have been discussed with the patient and family. After consideration of risks, benefits and other options for treatment, the patient has consented to  Procedure(s): HARDWARE REMOVAL right knee (Right) as a surgical intervention.  The patient's history has been reviewed, patient examined, no change in status, stable for surgery.  I have reviewed the patient's chart and labs.  Questions were answered to the patient's satisfaction.     Arther Abbott

## 2019-09-21 NOTE — Discharge Instructions (Signed)
General Anesthesia, Adult, Care After This sheet gives you information about how to care for yourself after your procedure. Your health care provider [place] also give you more specific instructions. If you have problems or questions, contact your health care provider. What can I expect after the procedure? After the procedure, the following side effects are common:  Pain or discomfort at the IV site.  Nausea.  Vomiting.  Sore throat.  Trouble concentrating.  Feeling cold or chills.  Weak or tired.  Sleepiness and fatigue.  Soreness and body aches. These side effects can affect parts of the body that were not involved in surgery. Follow these instructions at home:  For at least 24 hours after the procedure:  Have a responsible adult stay with you. It is important to have someone help care for you until you are awake and alert.  Rest as needed.  Do not: ? Participate in activities in which you could fall or become injured. ? Drive. ? Use heavy machinery. ? Drink alcohol. ? Take sleeping pills or medicines that cause drowsiness. ? Make important decisions or sign legal documents. ? Take care of children on your own. Eating and drinking  Follow any instructions from your health care provider about eating or drinking restrictions.  When you feel hungry, start by eating small amounts of foods that are soft and easy to digest (bland), such as toast. Gradually return to your regular diet.  Drink enough fluid to keep your urine pale yellow.  If you vomit, rehydrate by drinking water, juice, or clear broth. General instructions  If you have sleep apnea, surgery and certain medicines can increase your risk for breathing problems. Follow instructions from your health care provider about wearing your sleep device: ? Anytime you are sleeping, including during daytime naps. ? While taking prescription pain medicines, sleeping medicines, or medicines that make you drowsy.  Return to  your normal activities as told by your health care provider. Ask your health care provider what activities are safe for you.  Take over-the-counter and prescription medicines only as told by your health care provider.  If you smoke, do not smoke without supervision.  Keep all follow-up visits as told by your health care provider. This is important. Contact a health care provider if:  You have nausea or vomiting that does not get better with medicine.  You cannot eat or drink without vomiting.  You have pain that does not get better with medicine.  You are unable to pass urine.  You develop a skin rash.  You have a fever.  You have redness around your IV site that gets worse. Get help right away if:  You have difficulty breathing.  You have chest pain.  You have blood in your urine or stool, or you vomit blood. Summary  After the procedure, it is common to have a sore throat or nausea. It is also common to feel tired.  Have a responsible adult stay with you for the first 24 hours after general anesthesia. It is important to have someone help care for you until you are awake and alert.  When you feel hungry, start by eating small amounts of foods that are soft and easy to digest (bland), such as toast. Gradually return to your regular diet.  Drink enough fluid to keep your urine pale yellow.  Return to your normal activities as told by your health care provider. Ask your health care provider what activities are safe for you. This information is not   intended to replace advice given to you by your health care provider. Make sure you discuss any questions you have with your health care provider. Document Revised: 08/15/2017 Document Reviewed: 03/28/2017 Elsevier Patient Education  2020 [employer]  

## 2019-09-21 NOTE — Brief Op Note (Signed)
09/21/2019  10:21 AM  PATIENT:  Tawhid Corso Reasons  44 y.o. male  PRE-OPERATIVE DIAGNOSIS:  painful retained hardware right knee [name] staple  POST-OPERATIVE DIAGNOSIS:  painful retained hardware right knee Celesta Aver staple  PROCEDURE:  Procedure(s): HARDWARE REMOVAL (RICHARD'S STAPLE) RIGHT KNEE (Right)  SURGEON:  Surgeon(s) and Role:    [name], MD - Primary  Findings at surgery intact Richard stable.  Lachman test trace positive firm endpoint no pivot.  Details of surgery Patient was seen in the preop area the right leg was examined.  I approved that he could proceed with surgery.  Surgical site was confirmed and marked chart was reviewed.  He was taken to the operating room.  In the supine position and general anesthesia administered via intubation.  Sterile prep and drape timeout was completed  The prior incision was used and made.  We divided the skin the subcutaneous tissue went down to the prominent staple cleared of all soft tissue and then reapplied the [facility] inserter and remove the staple.  We obtained hemostasis irrigated the wound and injected it with 20 cc of Marcaine plain half percent and then closed with 2-0 Monocryl and subcu Monocryl.  Benzoin and Steri-Strips.  Sterile dressing was applied patient extubated taken recovery room in stable condition  PHYSICIAN ASSISTANT:   ASSISTANTS: none   ANESTHESIA:   general  EBL:  none   BLOOD ADMINISTERED:none  DRAINS: none   LOCAL MEDICATIONS USED:  MARCAINE     SPECIMEN:  No Specimen  DISPOSITION OF SPECIMEN:  N/A  COUNTS:  YES  TOURNIQUET:  * Missing tourniquet times found for documented tourniquets in log: [id number] *  DICTATION: .Viviann Spare Dictation  PLAN OF CARE: Discharge to home after PACU  PATIENT DISPOSITION:  PACU - hemodynamically stable.   Delay start of Pharmacological VTE agent (>24hrs) due to surgical blood loss or risk of bleeding: not applicable

## 2019-09-21 NOTE — Anesthesia Postprocedure Evaluation (Signed)
Anesthesia Post Note  Patient: Victor [name]  Procedure(s) Performed: HARDWARE REMOVAL (RICHARD'S STAPLE) RIGHT KNEE (Right )  Anesthesia Type: General Level of consciousness: awake and alert and patient cooperative Pain management: satisfactory to patient Vital Signs Assessment: post-procedure vital signs reviewed and stable Respiratory status: spontaneous breathing Cardiovascular status: stable Postop Assessment: no apparent nausea or vomiting Anesthetic complications: no     Last Vitals:  Vitals:   09/21/19 1200 09/21/19 1210  BP: (!) 140/97 (!) 144/102  Pulse: 92 77  Resp: 15 16  Temp:  36.9 C  SpO2: 92% 98%    Last Pain:  Vitals:   09/21/19 1210  TempSrc: Oral  PainSc: 6                  Beatriz Quintela

## 2019-09-21 NOTE — Op Note (Signed)
09/21/2019  10:21 AM  PATIENT:  Victor [name]  43 y.o. male  PRE-OPERATIVE DIAGNOSIS:  painful retained hardware right knee [name] staple  POST-OPERATIVE DIAGNOSIS:  painful retained hardware right knee Celesta Aver staple  PROCEDURE:  Procedure(s): HARDWARE REMOVAL (RICHARD'S STAPLE) RIGHT KNEE (Right)  SURGEON:  Surgeon(s) and Role:    [name], MD - Primary  Findings at surgery intact Richard stable.  Lachman test trace positive firm endpoint no pivot.  Details of surgery Patient was seen in the preop area the right leg was examined.  I approved that he could proceed with surgery.  Surgical site was confirmed and marked chart was reviewed.  He was taken to the operating room.  In the supine position and general anesthesia administered via intubation.  Sterile prep and drape timeout was completed  The prior incision was used and made.  We divided the skin the subcutaneous tissue went down to the prominent staple cleared of all soft tissue and then reapplied the [facility] inserter and remove the staple.  We obtained hemostasis irrigated the wound and injected it with 20 cc of Marcaine plain half percent and then closed with 2-0 Monocryl and subcu Monocryl.  Benzoin and Steri-Strips.  Sterile dressing was applied patient extubated taken recovery room in stable condition  PHYSICIAN ASSISTANT:   ASSISTANTS: none   ANESTHESIA:   general  EBL:  none   BLOOD ADMINISTERED:none  DRAINS: none   LOCAL MEDICATIONS USED:  MARCAINE     SPECIMEN:  No Specimen  DISPOSITION OF SPECIMEN:  N/A  COUNTS:  YES  TOURNIQUET:  * Missing tourniquet times found for documented tourniquets in log: [id number] *  DICTATION: .Viviann Spare Dictation  PLAN OF CARE: Discharge to home after PACU  PATIENT DISPOSITION:  PACU - hemodynamically stable.   Delay start of Pharmacological VTE agent (>24hrs) due to surgical blood loss or risk of bleeding: not applicable

## 2019-09-22 ENCOUNTER — Telehealth: Payer: Self-pay | Admitting: Orthopedic Surgery

## 2019-09-22 NOTE — Telephone Encounter (Signed)
Patient/wife, Victor [name], designated contact on file, relays that patient's pain medication prescribed at time of discharge from out-patient surgery yesterday is not helping much with the pain. Please call at [facility] [phone]. [facility] [address], [place].

## 2019-09-22 NOTE — Telephone Encounter (Signed)
Double the medication until he gets pain relief

## 2019-09-22 NOTE — Telephone Encounter (Signed)
Left message for her to advise, and asked her to call me back.

## 2019-09-22 NOTE — Telephone Encounter (Signed)
Pt's wife Hinton Dyer called back.  She said she got Amy's message and they have been icing, and elevating and will do medication as advised.

## 2019-09-23 NOTE — Telephone Encounter (Signed)
Thanks

## 2019-10-04 DIAGNOSIS — Z9889 Other specified postprocedural states: Secondary | ICD-10-CM | POA: Insufficient documentation

## 2019-10-06 ENCOUNTER — Other Ambulatory Visit: Payer: Self-pay

## 2019-10-06 ENCOUNTER — Ambulatory Visit (INDEPENDENT_AMBULATORY_CARE_PROVIDER_SITE_OTHER): Payer: Medicaid Other | Admitting: Orthopedic Surgery

## 2019-10-06 VITALS — BP 156/84 | HR 89 | Temp 97.9°F | Ht 71.0 in | Wt 250.0 lb

## 2019-10-06 DIAGNOSIS — M1711 Unilateral primary osteoarthritis, right knee: Secondary | ICD-10-CM

## 2019-10-06 DIAGNOSIS — Z9889 Other specified postprocedural states: Secondary | ICD-10-CM

## 2019-10-06 DIAGNOSIS — G8929 Other chronic pain: Secondary | ICD-10-CM

## 2019-10-06 DIAGNOSIS — S83241D Other tear of medial meniscus, current injury, right knee, subsequent encounter: Secondary | ICD-10-CM | POA: Diagnosis not present

## 2019-10-06 DIAGNOSIS — M25561 Pain in right knee: Secondary | ICD-10-CM

## 2019-10-06 MED ORDER — HYDROCODONE-ACETAMINOPHEN 5-325 MG PO TABS: 1.0000 | ORAL_TABLET | Freq: Four times a day (QID) | ORAL | 0 refills | Status: DC | PRN

## 2019-10-06 NOTE — Progress Notes (Signed)
Postop status post hardware removal right knee (Richard staple)  Chief Complaint  Patient presents with  . Follow-up    Post op recheck on right knee, DOS 09-21-19.   POD # 15   Current Outpatient Medications:  .  amLODipine (NORVASC) 10 MG tablet, Take 1 tablet (10 mg total) by mouth daily., Disp: 180 tablet, Rfl: 3 .  atorvastatin (LIPITOR) 80 MG tablet, Take 1 tablet (80 mg total) by mouth daily., Disp: 90 tablet, Rfl: 3 .  ibuprofen (ADVIL) 800 MG tablet, Take 1 tablet (800 mg total) by mouth every 8 (eight) hours as needed. (Patient taking differently: Take 800 mg by mouth every 8 (eight) hours as needed (pain.). ), Disp: 30 tablet, Rfl: 0 .  promethazine (PHENERGAN) 25 MG tablet, Take 1 tablet (25 mg total) by mouth every 6 (six) hours as needed for nausea or vomiting., Disp: 30 tablet, Rfl: 1 .  sulindac (CLINORIL) 150 MG tablet, Take 1 tablet (150 mg total) by mouth 2 (two) times daily., Disp: 60 tablet, Rfl: 1   WOUND: Clean no signs of infection edges were clipped  Pain over the surgical site he says he feels much better although he still having his joint pain  He will continue his anti-inflammatories and hydrocodone  CRUTCHES STOP WHEN READY  FU 2 MONTHS   Encounter Diagnoses  Name Primary?  . S/P hardware removal right knee 09/21/19 Yes  . Primary osteoarthritis of right knee   . Chronic pain of right knee   . S/P ACL repair right 05/19/18     Meds ordered this encounter  Medications  . HYDROcodone-acetaminophen (NORCO/VICODIN) 5-325 MG tablet    Sig: Take 1 tablet by mouth every 6 (six) hours as needed for moderate pain.    Dispense:  30 tablet    Refill:  0

## 2019-10-31 DIAGNOSIS — G8929 Other chronic pain: Secondary | ICD-10-CM

## 2019-10-31 DIAGNOSIS — M1711 Unilateral primary osteoarthritis, right knee: Secondary | ICD-10-CM

## 2019-10-31 DIAGNOSIS — M25561 Pain in right knee: Secondary | ICD-10-CM

## 2019-11-01 MED ORDER — HYDROCODONE-ACETAMINOPHEN 5-325 MG PO TABS: 1.0000 | ORAL_TABLET | Freq: Four times a day (QID) | ORAL | 0 refills | Status: DC | PRN

## 2019-12-08 ENCOUNTER — Other Ambulatory Visit: Payer: Self-pay

## 2019-12-08 ENCOUNTER — Ambulatory Visit (INDEPENDENT_AMBULATORY_CARE_PROVIDER_SITE_OTHER): Payer: Medicaid Other | Admitting: Orthopedic Surgery

## 2019-12-08 VITALS — BP 86/54 | HR 65 | Temp 99.7°F | Ht 71.0 in | Wt 250.0 lb

## 2019-12-08 DIAGNOSIS — M25561 Pain in right knee: Secondary | ICD-10-CM

## 2019-12-08 DIAGNOSIS — M1711 Unilateral primary osteoarthritis, right knee: Secondary | ICD-10-CM | POA: Diagnosis not present

## 2019-12-08 DIAGNOSIS — M5410 Radiculopathy, site unspecified: Secondary | ICD-10-CM

## 2019-12-08 DIAGNOSIS — G8929 Other chronic pain: Secondary | ICD-10-CM

## 2019-12-08 DIAGNOSIS — Z9889 Other specified postprocedural states: Secondary | ICD-10-CM

## 2019-12-08 MED ORDER — GABAPENTIN 100 MG PO CAPS: 100.0000 mg | ORAL_CAPSULE | Freq: Three times a day (TID) | ORAL | 2 refills | Status: DC

## 2019-12-08 NOTE — Patient Instructions (Signed)
Continue knee brace and using a cane  You will be called about physical therapy  We are going to add some gabapentin you will take 100 mg 3 times a day use Norco as needed

## 2019-12-08 NOTE — Progress Notes (Signed)
Chief Complaint  Patient presents with  . Follow-up    Recheck onright knee, DOS 09-21-19.     Current Outpatient Medications:  .  amLODipine (NORVASC) 10 MG tablet, Take 1 tablet (10 mg total) by mouth daily., Disp: 180 tablet, Rfl: 3 .  HYDROcodone-acetaminophen (NORCO/VICODIN) 5-325 MG tablet, Take 1 tablet by mouth every 6 (six) hours as needed for moderate pain., Disp: 30 tablet, Rfl: 0 .  ibuprofen (ADVIL) 800 MG tablet, Take 1 tablet (800 mg total) by mouth every 8 (eight) hours as needed. (Patient taking differently: Take 800 mg by mouth every 8 (eight) hours as needed (pain.). ), Disp: 30 tablet, Rfl: 0 .  atorvastatin (LIPITOR) 80 MG tablet, Take 1 tablet (80 mg total) by mouth daily. (Patient not taking: Reported on 12/08/2019), Disp: 90 tablet, Rfl: 3 .  gabapentin (NEURONTIN) 100 MG capsule, Take 1 capsule (100 mg total) by mouth 3 (three) times daily., Disp: 90 capsule, Rfl: 2   Victor [name] is status post hardware removal from his knee after previous allograft ACL reconstruction.  He still complaining of locking and severe pain in his right knee with occasional swelling.  He is using a cane and a brace he says his hydrocodone does not work he did not tolerate Clinoril so he is back on ibuprofen  He also has some back pain with some radiation into the right knee and posterior part of his right leg  Has a history of degenerative disc disease previous MRI was done but it was a long time ago  He does have some tenderness in his lower back and also on the right side of the posterior part of the leg and thigh his knee shows a 1+ drawer sign no effusion medial lateral joint line tenderness with full range of motion except for terminal extension 3 to 4 degrees lacking  I think he is having some pain from secondary source mainly his back along with his arthritis in his knee and he Boyde be having instability in the knee which she is perceiving as locking  I told him that knee replacement in  this setting often fails leading to arthrofibrosis and there is potential complication of uncontrolled infection which require amputation  He is very concerned about the way he is having to live with the way his knee is  The game plan is going to be to add some gabapentin to control the neurogenic pain source on the right at some physical therapy come back in 6 weeks determine if we need an MRI  We will also consider repeat arthroscopy to evaluate the ligament and intra-articular structures prior to doing knee replacement  Encounter Diagnoses  Name Primary?  . Radicular pain of right lower extremity Yes  . Chronic pain of right knee   . Primary osteoarthritis of right knee   . S/P ACL repair right 05/19/18    Meds ordered this encounter  Medications  . gabapentin (NEURONTIN) 100 MG capsule    Sig: Take 1 capsule (100 mg total) by mouth 3 (three) times daily.    Dispense:  90 capsule    Refill:  2

## 2019-12-24 ENCOUNTER — Ambulatory Visit (HOSPITAL_COMMUNITY): Payer: Medicaid Other | Attending: Orthopedic Surgery | Admitting: Physical Therapy

## 2019-12-24 ENCOUNTER — Encounter (HOSPITAL_COMMUNITY): Payer: Self-pay | Admitting: Physical Therapy

## 2019-12-24 ENCOUNTER — Other Ambulatory Visit: Payer: Self-pay

## 2019-12-24 DIAGNOSIS — M545 Low back pain, unspecified: Secondary | ICD-10-CM

## 2019-12-24 DIAGNOSIS — G8929 Other chronic pain: Secondary | ICD-10-CM | POA: Diagnosis not present

## 2019-12-24 DIAGNOSIS — R29898 Other symptoms and signs involving the musculoskeletal system: Secondary | ICD-10-CM | POA: Insufficient documentation

## 2019-12-24 DIAGNOSIS — M79604 Pain in right leg: Secondary | ICD-10-CM | POA: Diagnosis not present

## 2019-12-24 DIAGNOSIS — M6281 Muscle weakness (generalized): Secondary | ICD-10-CM | POA: Diagnosis not present

## 2019-12-24 NOTE — Therapy (Signed)
[facility] [address] [place], [place], [address] Phone: [phone]   Fax:  [phone]  Physical Therapy Evaluation  Patient Details  Name: Victor [name] MRN: [id number] Date of Birth: [birth year] Referring Provider (PT): Arther Abbott, MD   Encounter Date: 12/24/2019  PT End of Session - 12/24/19 1658    Visit Number  1    Number of Visits  12    Date for PT Re-Evaluation  02/04/20    Authorization Type  Medicaid    Authorization Time Period  3 visits requested. Check authorization    Authorization - Visit Number  0    Authorization - Number of Visits  3    Progress Note Due on Visit  10    PT Start Time  [id number]    PT Stop Time  1632    PT Time Calculation (min)  42 min    Equipment Utilized During Treatment  Other (comment)   RT knee brace   Activity Tolerance  Patient tolerated treatment well    Behavior During Therapy  WFL for tasks assessed/performed       Past Medical History:  Diagnosis Date  . GERD (gastroesophageal reflux disease)   . History of stomach ulcers   . Hypertension     Past Surgical History:  Procedure Laterality Date  . ANTERIOR CRUCIATE LIGAMENT REPAIR Right 05/19/2018   Procedure: RIGHT KNEE ARTHROSCOPY WITH ANTERIOR CRUCIATE LIGAMENT (ACL) REPAIR and MEDIAL MENISECTOMY;  Surgeon: [name], MD;  Location: [facility];  Service: Orthopedics;  Laterality: Right;  . APPENDECTOMY    . HARDWARE REMOVAL Right 09/21/2019   Procedure: HARDWARE REMOVAL (RICHARD'S STAPLE) RIGHT KNEE;  Surgeon: [name], MD;  Location: [facility];  Service: Orthopedics;  Laterality: Right;  . KNEE ARTHROSCOPY WITH MEDIAL MENISECTOMY Right 05/29/2017   Procedure: KNEE ARTHROSCOPY WITH MEDIAL MENISECTOMY and lateral meniscectomy;  Surgeon: [name], MD;  Location: [facility];  Service: Orthopedics;  Laterality: Right;  . KNEE ARTHROSCOPY WITH MEDIAL MENISECTOMY Right 02/05/2018   Procedure: RIGHT KNEE  ARTHROSCOPY WITH PARTIAL MEDIAL AND LATERAL MENISECTOMY;  Surgeon: [name], MD;  Location: [facility];  Service: Orthopedics;  Laterality: Right;    There were no vitals filed for this visit.   Subjective Assessment - 12/24/19 1610    Subjective  Patient reported that his back will "give out" sometimes and that it will be really tender. He reported that he had an MRI in 2012 and that it showed degenerative disc disease. Patient reported that his back hurts on both sides. Patient reported that whenever his back hurts his knee hurts too. Patient reported that if he rolls onto his stomach and wakes up this way his back and his knee will feel locked up. Patient reported that when he was taking Gabapentin he could feel it radiating all the way down to his foot, but that he stopped taking this per MD request. Patient denied any changes in bowel and bladder function. Patient reported that if he just sits for a while and doesn't sit up straight. He reported that he fractured his L4-L5 vertebrae years ago, and that this was treated conservatively.    Pertinent History  RT ACL repair; Hx L4-5 fracture    Limitations  Sitting;Lifting;Standing;Walking;House hold activities    How long can you sit comfortably?  15-20 minutes    Patient Stated Goals  less pain    Currently in Pain?  Yes    Pain  Score  6     Pain Location  Back    Pain Orientation  Lower;Medial    Pain Descriptors / Indicators  Aching    Pain Type  Chronic pain    Pain Onset  More than a month ago    Pain Frequency  Intermittent    Aggravating Factors   Sleeping on stomach, sitting a while    Pain Relieving Factors  Heating pad         [facility] PT Assessment - 12/24/19 0001      Assessment   Medical Diagnosis  Radicular Pain of Right LE    Referring Provider (PT)  Arther Abbott, MD    Onset Date/Surgical Date  --   Years, off and on   Next MD Visit  01/19/20      Precautions   Precautions  --   Crutches when walking  longer distances   Required Braces or Orthoses  --   Wear soft knee brace on the RT     Restrictions   Weight Bearing Restrictions  No      [place] residence    Living Arrangements  Spouse/significant other    Available Help at Discharge  Family    Type of [place] to enter    Entrance Stairs-Number of Steps  3      Prior Function   Level of Independence  Independent    Vocation  Unemployed    Leisure  wood work       Cognition   Overall Cognitive Status  Within Functional Limits for tasks assessed      Sensation   Light Touch  Appears Intact      AROM   AROM Assessment Site  Lumbar    Right/Left Knee  Right    Right Knee Extension  --   Lacks 31 degrees   Right Knee Flexion  101    Lumbar Flexion  50% limited; painful going down and coming back up    Lumbar Extension  90% limited; painful    Lumbar - Right Side Bend  25% limited    Lumbar - Left Side Bend  25% limited; painful     Lumbar - Right Rotation  WFL; no pain    Lumbar - Left Rotation  WFL; no pain      Strength   Right/Left Hip  Left    Right Hip Flexion  4-/5    Left Hip Flexion  5/5    Right/Left Knee  Left    Right Knee Flexion  3+/5    Right Knee Extension  3+/5    Left Knee Flexion  5/5    Left Knee Extension  5/5      Palpation   Spinal mobility  CPAs of thoracic through lumbar spine general hypomobility, painful in lumbar spine     Palpation comment  TTP through lumbar paraspinals, noted muscle guarding with CPAs and increased musclar restrictions in erector spinae      Ambulation/Gait   Ambulation/Gait  Yes    Ambulation Distance (Feet)  300 Feet   2MWT   Assistive device  None    Gait Pattern  Decreased stance time - right;Decreased step length - left;Antalgic    Ambulation Surface  Level;Indoor                Objective measurements completed on examination: See above findings.  PT  Education - 12/24/19 1657    Education Details  Discussed examination findings and POC.    Person(s) Educated  Patient    Methods  Explanation    Comprehension  Verbalized understanding       PT Short Term Goals - 12/24/19 1639      PT SHORT TERM GOAL #1   Title  Patient will report understanding and regular compliance with HEP to improve mobility and decrease overall pain.    Time  3    Period  Weeks    Status  New    Target Date  01/14/20      PT SHORT TERM GOAL #2   Title  Patient will report an improvement of at least 25% in overall subjective complaint for improved QoL.    Time  3    Period  Weeks    Status  New    Target Date  01/14/20        PT Long Term Goals - 12/24/19 1643      PT LONG TERM GOAL #1   Title  Patient will report an improvement of at least 50% in overall subjective complaint for improved QoL.    Time  6    Period  Weeks    Status  New    Target Date  02/04/20      PT LONG TERM GOAL #2   Title  Patient will report that his back pain has not exceeded a 3/10 over the course of a 1 week period indicating improved tolerance to daily activities.    Time  6    Period  Weeks    Status  New    Target Date  02/04/20      PT LONG TERM GOAL #3   Title  Patient will be able to sit for at least 30 minutes without increase in pain in order to complete a meal with improved ease and comfort.    Time  6    Period  Weeks    Status  New    Target Date  02/04/20      PT LONG TERM GOAL #4   Title  Patient will perform lumbar AROM without increase in pain indicating improved lumbar mobility and ease of household activities.    Time  6    Period  Weeks    Status  New    Target Date  02/04/20             Plan - 12/24/19 1652    Clinical Impression Statement  Patient is a 44 year old male with significant history of right knee pain and surgeries who now presents to outpatient physical therapy with complaint of low back pain. Upon examination, noted  deficits in patient's lumbar spine AROM as well as RT knee AROM. Patient demonstrated decreased LE strength and gait deviations on the 2MWT. Noted hypomobility through thoracic and lumbar spine as well as muscle guarding and spasm with this. With palpation noted tenderness to palpation of lumbar paraspinals. Patient does appear to have signs and symptoms consistent with category of low back pain with mobility deficits, however, it is unclear at this time how much the back pain is contributing to the patient's knee pain or the knee pain is contributing to the patient's back pain. Patient would benefit from continued skilled physical therapy in order to continue assessing this as needed and to address the abovementioned deficits in order to help patient return to his PLOF.  Personal Factors and Comorbidities  Comorbidity 3+    Comorbidities  2 arthroscopic sugeries; ACL repair    Examination-Activity Limitations  Carry;Dressing;Lift;Locomotion Level;Squat;Stairs;Stand;Transfers    Examination-Participation Restrictions  Cleaning;Community Activity;Driving;Laundry;Meal Prep;Shop;Yard Work    [employer] officer  Evolving/Moderate complexity    Clinical Decision Making  Moderate    Rehab Potential  Fair    PT Frequency  2x / week    PT Duration  6 weeks    PT Treatment/Interventions  ADLs/Self Care Home Management;Patient/family education;Manual techniques;Therapeutic exercise;Therapeutic activities;Functional mobility training;Balance training;Stair training;Gait training;Aquatic Therapy;Cryotherapy;Electrical Stimulation;Moist Heat;Traction;DME Instruction;Neuromuscular re-education;Orthotic Fit/Training;Passive range of motion;Dry needling;Energy conservation;Taping;Spinal Manipulations;Joint Manipulations    PT Next Visit Plan  Check insurance approval. Review eval/goals. Trial gentle spinal joint mobilizations for lower thoracic and lumbar spine for pain relief and STM to lumbar  paraspinals. Trial double knee to chest stretch and LTR and add to HEP if tolerates well. Continue to monitor Right knee as contributing to back pain.    PT Home Exercise Plan  Initiate at first session:    Consulted and Agree with Plan of Care  Patient       Patient will benefit from skilled therapeutic intervention in order to improve the following deficits and impairments:  Abnormal gait, Decreased activity tolerance, Decreased balance, Decreased strength, Decreased range of motion, Difficulty walking, Pain, Improper body mechanics, Decreased mobility, Increased muscle spasms, Hypomobility  Visit Diagnosis: Chronic bilateral low back pain, unspecified whether sciatica present  Pain in right leg  Muscle weakness (generalized)  Other symptoms and signs involving the musculoskeletal system     Problem List Patient Active Problem List   Diagnosis Date Noted  . S/P hardware removal right knee 09/21/19 10/04/2019  . Essential hypertension 04/07/2019  . Family history of early CAD 04/07/2019  . History of chest pain 03/10/2019  . Hyperlipidemia 03/10/2019  . Tobacco abuse 03/10/2019  . S/P right knee arthroscopy 02/05/18 06/23/2017  . S/P ACL repair right 05/19/18   . Chondromalacia of lateral femoral condyle, right   . Chondromalacia, patella, right    Clarene Critchley PT, DPT 5:08 PM, 12/24/19 [facility] [place], [place], [address] Phone: [phone]   Fax:  [phone]  Name: Victor [name] MRN: [id number] Date of Birth: 07/26/1976

## 2019-12-27 ENCOUNTER — Other Ambulatory Visit: Payer: Self-pay

## 2019-12-27 ENCOUNTER — Encounter (HOSPITAL_COMMUNITY): Payer: Self-pay | Admitting: Physical Therapy

## 2019-12-27 ENCOUNTER — Ambulatory Visit (HOSPITAL_COMMUNITY): Payer: Medicaid Other | Attending: Orthopedic Surgery | Admitting: Physical Therapy

## 2019-12-27 DIAGNOSIS — M79604 Pain in right leg: Secondary | ICD-10-CM | POA: Diagnosis not present

## 2019-12-27 DIAGNOSIS — R29898 Other symptoms and signs involving the musculoskeletal system: Secondary | ICD-10-CM | POA: Diagnosis not present

## 2019-12-27 DIAGNOSIS — M545 Low back pain: Secondary | ICD-10-CM | POA: Diagnosis not present

## 2019-12-27 DIAGNOSIS — M6281 Muscle weakness (generalized): Secondary | ICD-10-CM | POA: Insufficient documentation

## 2019-12-27 DIAGNOSIS — G8929 Other chronic pain: Secondary | ICD-10-CM | POA: Diagnosis not present

## 2019-12-27 DIAGNOSIS — R2689 Other abnormalities of gait and mobility: Secondary | ICD-10-CM | POA: Insufficient documentation

## 2019-12-27 NOTE — Therapy (Signed)
[facility] [place], [place], [address] Phone: [phone]   Fax:  [phone]  Physical Therapy Treatment  Patient Details  Name: Victor [name] MRN: [id number] Date of Birth: 17-Jul-1976 Referring Provider (PT): Arther Abbott, MD   Encounter Date: 12/27/2019  PT End of Session - 12/27/19 1203    Visit Number  2    Number of Visits  12    Date for PT Re-Evaluation  02/04/20    Authorization Type  Medicaid    Authorization Time Period  3 visits approved 5/3-5/16    Authorization - Visit Number  1    Authorization - Number of Visits  3    Progress Note Due on Visit  10    PT Start Time  1116    PT Stop Time  1158    PT Time Calculation (min)  42 min    Equipment Utilized During Treatment  Other (comment)   RT knee brace   Activity Tolerance  Patient tolerated treatment well    Behavior During Therapy  [facility] for tasks assessed/performed       Past Medical History:  Diagnosis Date  . GERD (gastroesophageal reflux disease)   . History of stomach ulcers   . Hypertension     Past Surgical History:  Procedure Laterality Date  . ANTERIOR CRUCIATE LIGAMENT REPAIR Right 05/19/2018   Procedure: RIGHT KNEE ARTHROSCOPY WITH ANTERIOR CRUCIATE LIGAMENT (ACL) REPAIR and MEDIAL MENISECTOMY;  Surgeon: [name], MD;  Location: [facility];  Service: Orthopedics;  Laterality: Right;  . APPENDECTOMY    . HARDWARE REMOVAL Right 09/21/2019   Procedure: HARDWARE REMOVAL (RICHARD'S STAPLE) RIGHT KNEE;  Surgeon: [name], MD;  Location: [facility];  Service: Orthopedics;  Laterality: Right;  . KNEE ARTHROSCOPY WITH MEDIAL MENISECTOMY Right 05/29/2017   Procedure: KNEE ARTHROSCOPY WITH MEDIAL MENISECTOMY and lateral meniscectomy;  Surgeon: [name], MD;  Location: [facility];  Service: Orthopedics;  Laterality: Right;  . KNEE ARTHROSCOPY WITH MEDIAL MENISECTOMY Right 02/05/2018   Procedure: RIGHT KNEE ARTHROSCOPY WITH  PARTIAL MEDIAL AND LATERAL MENISECTOMY;  Surgeon: [name], MD;  Location: [facility];  Service: Orthopedics;  Laterality: Right;    There were no vitals filed for this visit.  Subjective Assessment - 12/27/19 1116    Subjective  Patient states his back is about the same. No change in symptoms. He has sharp pain into RLE down to calf/shin.    Pertinent History  RT ACL repair; Hx L4-5 fracture    Limitations  Sitting;Lifting;Standing;Walking;House hold activities    How long can you sit comfortably?  15-20 minutes    Patient Stated Goals  less pain    Currently in Pain?  Yes    Pain Score  6     Pain Location  Back    Pain Onset  More than a month ago                       [facility] Adult PT Treatment/Exercise - 12/27/19 0001      Exercises   Exercises  Lumbar      Lumbar Exercises: Stretches   Double Knee to Chest Stretch  10 seconds    Double Knee to Chest Stretch Limitations  10 reps    Other Lumbar Stretch Exercise  LTR 10 x 10 second hold bilateral      Lumbar Exercises: Supine   Ab Set  10 reps;5 seconds  Bent Knee Raise  10 reps    Bent Knee Raise Limitations  2 sets with ab sets    Bridge  10 reps    Bridge Limitations  increase in symptoms, discontinued      Manual Therapy   Manual Therapy  Joint mobilization;Soft tissue mobilization    Manual therapy comments  completed seperate form all other skilled interventions    Joint Mobilization  Grade II CPA T10-L5    Soft tissue mobilization  lumbar paraspinals              PT Education - 12/27/19 1202    Education Details  Patient educated on beginning HEP, low back pathology, LE compensations for injury, manual therapy    Person(s) Educated  Patient    Methods  Explanation    Comprehension  Verbalized understanding       PT Short Term Goals - 12/27/19 1207      PT SHORT TERM GOAL #1   Title  Patient will report understanding and regular compliance with HEP to improve mobility and  decrease overall pain.    Time  3    Period  Weeks    Status  On-going    Target Date  01/14/20      PT SHORT TERM GOAL #2   Title  Patient will report an improvement of at least 25% in overall subjective complaint for improved QoL.    Time  3    Period  Weeks    Status  On-going    Target Date  01/14/20        PT Long Term Goals - 12/27/19 1207      PT LONG TERM GOAL #1   Title  Patient will report an improvement of at least 50% in overall subjective complaint for improved QoL.    Time  6    Period  Weeks    Status  On-going      PT LONG TERM GOAL #2   Title  Patient will report that his back pain has not exceeded a 3/10 over the course of a 1 week period indicating improved tolerance to daily activities.    Time  6    Period  Weeks    Status  On-going      PT LONG TERM GOAL #3   Title  Patient will be able to sit for at least 30 minutes without increase in pain in order to complete a meal with improved ease and comfort.    Time  6    Period  Weeks    Status  On-going      PT LONG TERM GOAL #4   Title  Patient will perform lumbar AROM without increase in pain indicating improved lumbar mobility and ease of household activities.    Time  6    Period  Weeks    Status  On-going            Plan - 12/27/19 1204    Clinical Impression Statement  Patient not having RLE symptoms upon prone positioning. He tolerates low grade mobilizations well but has c/o pain throughout assessment and mobilizations of thoracic and lumbar spine. He feels minimal change in low back symptoms following mobilizations with decreased stiffness following. He experiences reduction in stiffness following DKTC and LTR and minimal decrease in symptoms. He demonstrates good core activation with marches and bridges but requires verbal cueing for prior glute activation with bridges. He has c/o increase in back pain with bridges  so they are discontinued for now. Patient educated on monitoring symptoms to  assess response to initial treatment. Patient will continue to benefit from skilled physical therapy in order to reduce impairment and improve function.    Personal Factors and Comorbidities  Comorbidity 3+    Comorbidities  2 arthroscopic sugeries; ACL repair    Examination-Activity Limitations  Carry;Dressing;Lift;Locomotion Level;Squat;Stairs;Stand;Transfers    Examination-Participation Restrictions  Cleaning;Community Activity;Driving;Laundry;Meal Prep;Shop;Yard Work    [employer] officer  Evolving/Moderate complexity    Rehab Potential  Fair    PT Frequency  2x / week    PT Duration  6 weeks    PT Treatment/Interventions  ADLs/Self Care Home Management;Patient/family education;Manual techniques;Therapeutic exercise;Therapeutic activities;Functional mobility training;Balance training;Stair training;Gait training;Aquatic Therapy;Cryotherapy;Electrical Stimulation;Moist Heat;Traction;DME Instruction;Neuromuscular re-education;Orthotic Fit/Training;Passive range of motion;Dry needling;Energy conservation;Taping;Spinal Manipulations;Joint Manipulations    PT Next Visit Plan  Trial gentle spinal joint mobilizations for lower thoracic and lumbar spine for pain relief and STM to lumbar paraspinals. assess response to double knee to chest stretch and LTR. Continue to monitor Right knee as contributing to back pain. continue core strengtheing, trial extension based exercise if poor response to flexion based    PT Home Exercise Plan  12/27/19 DKTC, LTR, marching with ab set    Consulted and Agree with Plan of Care  Patient       Patient will benefit from skilled therapeutic intervention in order to improve the following deficits and impairments:  Abnormal gait, Decreased activity tolerance, Decreased balance, Decreased strength, Decreased range of motion, Difficulty walking, Pain, Improper body mechanics, Decreased mobility, Increased muscle spasms, Hypomobility  Visit Diagnosis: Chronic  bilateral low back pain, unspecified whether sciatica present  Pain in right leg  Muscle weakness (generalized)  Other symptoms and signs involving the musculoskeletal system     Problem List Patient Active Problem List   Diagnosis Date Noted  . S/P hardware removal right knee 09/21/19 10/04/2019  . Essential hypertension 04/07/2019  . Family history of early CAD 04/07/2019  . History of chest pain 03/10/2019  . Hyperlipidemia 03/10/2019  . Tobacco abuse 03/10/2019  . S/P right knee arthroscopy 02/05/18 06/23/2017  . S/P ACL repair right 05/19/18   . Chondromalacia of lateral femoral condyle, right   . Chondromalacia, patella, right     12:09 PM, 12/27/19 Mearl Latin PT, DPT Physical Therapist at [facility] [place], [place], [address] Phone: [phone]   Fax:  [phone]  Name: Zephyr Osegueda Risse MRN: [id number] Date of Birth: 08-24-1976

## 2019-12-27 NOTE — Patient Instructions (Signed)
Access Code: [email or web address] URL: [email or web address] Date: 12/27/2019 Prepared by: Mitzi Hansen Fleda Pagel  Exercises Supine Double Knee to Chest - 2 x daily - 7 x weekly - 1 sets - 10 reps - 10 second hold Supine Lower Trunk Rotation - 2 x daily - 7 x weekly - 10 reps - 5-10 second hold Supine March - 2 x daily - 7 x weekly - 2 sets - 10 reps

## 2019-12-29 ENCOUNTER — Ambulatory Visit (HOSPITAL_COMMUNITY): Payer: Medicaid Other | Admitting: Physical Therapy

## 2019-12-29 ENCOUNTER — Ambulatory Visit: Payer: Medicaid Other | Admitting: Family Medicine

## 2019-12-29 ENCOUNTER — Encounter (HOSPITAL_COMMUNITY): Payer: Self-pay | Admitting: Physical Therapy

## 2019-12-29 ENCOUNTER — Other Ambulatory Visit: Payer: Self-pay

## 2019-12-29 DIAGNOSIS — M79604 Pain in right leg: Secondary | ICD-10-CM | POA: Diagnosis not present

## 2019-12-29 DIAGNOSIS — R29898 Other symptoms and signs involving the musculoskeletal system: Secondary | ICD-10-CM

## 2019-12-29 DIAGNOSIS — G8929 Other chronic pain: Secondary | ICD-10-CM | POA: Diagnosis not present

## 2019-12-29 DIAGNOSIS — M6281 Muscle weakness (generalized): Secondary | ICD-10-CM | POA: Diagnosis not present

## 2019-12-29 DIAGNOSIS — M545 Low back pain: Secondary | ICD-10-CM | POA: Diagnosis not present

## 2019-12-29 DIAGNOSIS — R2689 Other abnormalities of gait and mobility: Secondary | ICD-10-CM | POA: Diagnosis not present

## 2019-12-29 NOTE — Patient Instructions (Signed)
Access Code: [email or web address] URL: [email or web address] Date: 12/29/2019 Prepared by: [facility] Lexus Barletta  Exercises [employer] Up - 4 x daily - 7 x weekly - 2 sets - 10 reps Seated Thoracic Lumbar Extension - 1 x daily - 7 x weekly - 1 sets - 10 reps - 5 second hold Clamshell - 1 x daily - 7 x weekly - 2 sets - 15 reps

## 2019-12-29 NOTE — Therapy (Signed)
[place], [place], [address] Phone: [phone]   Fax:  [phone]  Physical Therapy Treatment  Patient Details  Name: Victor [name] MRN: [id number] Date of Birth: Mar 05, 1976 Referring Provider (PT): Arther Abbott, MD   Encounter Date: 12/29/2019  PT End of Session - 12/29/19 1133    Visit Number  3    Number of Visits  12    Date for PT Re-Evaluation  02/04/20    Authorization Type  Medicaid    Authorization Time Period  3 visits approved 5/3-5/16    Authorization - Visit Number  2    Authorization - Number of Visits  3    Progress Note Due on Visit  10    PT Start Time  1121    PT Stop Time  1200    PT Time Calculation (min)  39 min    Equipment Utilized During Treatment  Other (comment)   RT knee brace   Activity Tolerance  Patient tolerated treatment well    Behavior During Therapy  [facility] for tasks assessed/performed       Past Medical History:  Diagnosis Date  . GERD (gastroesophageal reflux disease)   . History of stomach ulcers   . Hypertension     Past Surgical History:  Procedure Laterality Date  . ANTERIOR CRUCIATE LIGAMENT REPAIR Right 05/19/2018   Procedure: RIGHT KNEE ARTHROSCOPY WITH ANTERIOR CRUCIATE LIGAMENT (ACL) REPAIR and MEDIAL MENISECTOMY;  Surgeon: [name], MD;  Location: [facility];  Service: Orthopedics;  Laterality: Right;  . APPENDECTOMY    . HARDWARE REMOVAL Right 09/21/2019   Procedure: HARDWARE REMOVAL (RICHARD'S STAPLE) RIGHT KNEE;  Surgeon: [name], MD;  Location: [facility];  Service: Orthopedics;  Laterality: Right;  . KNEE ARTHROSCOPY WITH MEDIAL MENISECTOMY Right 05/29/2017   Procedure: KNEE ARTHROSCOPY WITH MEDIAL MENISECTOMY and lateral meniscectomy;  Surgeon: [name], MD;  Location: [facility];  Service: Orthopedics;  Laterality: Right;  . KNEE ARTHROSCOPY WITH MEDIAL MENISECTOMY Right 02/05/2018   Procedure: RIGHT KNEE ARTHROSCOPY WITH  PARTIAL MEDIAL AND LATERAL MENISECTOMY;  Surgeon: [name], MD;  Location: [facility];  Service: Orthopedics;  Laterality: Right;    There were no vitals filed for this visit.  Subjective Assessment - 12/29/19 1120    Subjective  Patient states his back is about the same. He was sore Tuesday when he got up but now is about the same as before.    Pertinent History  RT ACL repair; Hx L4-5 fracture    Limitations  Sitting;Lifting;Standing;Walking;House hold activities    How long can you sit comfortably?  15-20 minutes    Patient Stated Goals  less pain    Currently in Pain?  Yes    Pain Score  6     Pain Location  Back    Pain Onset  More than a month ago                       [facility] Adult PT Treatment/Exercise - 12/29/19 0001      Lumbar Exercises: Stretches   Other Lumbar Stretch Exercise  LTR 10 x 10 second hold bilateral      Lumbar Exercises: Seated   Other Seated Lumbar Exercises  t/sp extension over chair 15 x 5 second holds      Lumbar Exercises: Sidelying   Clam  Both;15 reps      Lumbar Exercises: Prone   Straight  Leg Raise  10 reps    Straight Leg Raises Limitations  bilateral    Other Prone Lumbar Exercises  press up 2x10    Other Prone Lumbar Exercises  prone heel squeeze 5x 10 second holds      Manual Therapy   Manual Therapy  Joint mobilization;Soft tissue mobilization    Manual therapy comments  completed seperate form all other skilled interventions    Joint Mobilization  Grade II CPA L3-L5    Soft tissue mobilization  lumbar paraspinals              PT Education - 12/29/19 1129    Education Details  Patient educated on continuing HEP, mechanics and positioning of exercise    Person(s) Educated  Patient    Methods  Explanation;Demonstration    Comprehension  Verbalized understanding;Returned demonstration       PT Short Term Goals - 12/27/19 1207      PT SHORT TERM GOAL #1   Title  Patient will report understanding and  regular compliance with HEP to improve mobility and decrease overall pain.    Time  3    Period  Weeks    Status  On-going    Target Date  01/14/20      PT SHORT TERM GOAL #2   Title  Patient will report an improvement of at least 25% in overall subjective complaint for improved QoL.    Time  3    Period  Weeks    Status  On-going    Target Date  01/14/20        PT Long Term Goals - 12/27/19 1207      PT LONG TERM GOAL #1   Title  Patient will report an improvement of at least 50% in overall subjective complaint for improved QoL.    Time  6    Period  Weeks    Status  On-going      PT LONG TERM GOAL #2   Title  Patient will report that his back pain has not exceeded a 3/10 over the course of a 1 week period indicating improved tolerance to daily activities.    Time  6    Period  Weeks    Status  On-going      PT LONG TERM GOAL #3   Title  Patient will be able to sit for at least 30 minutes without increase in pain in order to complete a meal with improved ease and comfort.    Time  6    Period  Weeks    Status  On-going      PT LONG TERM GOAL #4   Title  Patient will perform lumbar AROM without increase in pain indicating improved lumbar mobility and ease of household activities.    Time  6    Period  Weeks    Status  On-going            Plan - 12/29/19 1134    Clinical Impression Statement  Patient performs LTR wrong which Filley be reason for increase in LE symptoms and requires verbal cueing and demonstration for appropriate mechanics. Patient experiences no change in symptoms following repeated extension exercises. Patient will trial repeated extension over next several days and is educated on reducing frequency/discontinuing if symptoms increase. Patient experiences no change in symptoms following manual therapy today. Patient tolerates thoracic extension exercise without increase in lumbar symptoms. Remainder of session focused on hip strengthening. Patient has  c/o increased  R knee pain with exercise but he is able to tolerate. Patient will continue to benefit from skilled physical therapy in order to improve function and reduce impairment.    Personal Factors and Comorbidities  Comorbidity 3+    Comorbidities  2 arthroscopic sugeries; ACL repair    Examination-Activity Limitations  Carry;Dressing;Lift;Locomotion Level;Squat;Stairs;Stand;Transfers    Examination-Participation Restrictions  Cleaning;Community Activity;Driving;Laundry;Meal Prep;Shop;Yard Work    [employer]  Evolving/Moderate complexity    Rehab Potential  Fair    PT Frequency  2x / week    PT Duration  6 weeks    PT Treatment/Interventions  ADLs/Self Care Home Management;Patient/family education;Manual techniques;Therapeutic exercise;Therapeutic activities;Functional mobility training;Balance training;Stair training;Gait training;Aquatic Therapy;Cryotherapy;Electrical Stimulation;Moist Heat;Traction;DME Instruction;Neuromuscular re-education;Orthotic Fit/Training;Passive range of motion;Dry needling;Energy conservation;Taping;Spinal Manipulations;Joint Manipulations    PT Next Visit Plan  Trial gentle spinal joint mobilizations for lower thoracic and lumbar spine for pain relief and STM to lumbar paraspinals. assess response to extension exercises. Continue to monitor Right knee as contributing to back pain. continue core strengtheing, and hip strengthening    PT Home Exercise Plan  12/27/19 DKTC, LTR, marching with ab set5/5/21 press ups, thoracic extension, clams    Consulted and Agree with Plan of Care  Patient       Patient will benefit from skilled therapeutic intervention in order to improve the following deficits and impairments:  Abnormal gait, Decreased activity tolerance, Decreased balance, Decreased strength, Decreased range of motion, Difficulty walking, Pain, Improper body mechanics, Decreased mobility, Increased muscle spasms, Hypomobility  Visit  Diagnosis: Chronic bilateral low back pain, unspecified whether sciatica present  Pain in right leg  Muscle weakness (generalized)  Other symptoms and signs involving the musculoskeletal system  Other abnormalities of gait and mobility     Problem List Patient Active Problem List   Diagnosis Date Noted  . S/P hardware removal right knee 09/21/19 10/04/2019  . Essential hypertension 04/07/2019  . Family history of early CAD 04/07/2019  . History of chest pain 03/10/2019  . Hyperlipidemia 03/10/2019  . Tobacco abuse 03/10/2019  . S/P right knee arthroscopy 02/05/18 06/23/2017  . S/P ACL repair right 05/19/18   . Chondromalacia of lateral femoral condyle, right   . Chondromalacia, patella, right     12:09 PM, 12/29/19 Mearl Latin PT, DPT Physical Therapist at [place], [place], [address] Phone: [phone]   Fax:  [phone]  Name: Victor [name] MRN: [id number] Date of Birth: [birth year]

## 2020-01-04 ENCOUNTER — Other Ambulatory Visit: Payer: Self-pay

## 2020-01-04 ENCOUNTER — Ambulatory Visit (HOSPITAL_COMMUNITY): Payer: Medicaid Other | Admitting: Physical Therapy

## 2020-01-04 DIAGNOSIS — M79604 Pain in right leg: Secondary | ICD-10-CM | POA: Diagnosis not present

## 2020-01-04 DIAGNOSIS — G8929 Other chronic pain: Secondary | ICD-10-CM | POA: Diagnosis not present

## 2020-01-04 DIAGNOSIS — M6281 Muscle weakness (generalized): Secondary | ICD-10-CM | POA: Diagnosis not present

## 2020-01-04 DIAGNOSIS — M545 Low back pain: Secondary | ICD-10-CM | POA: Diagnosis not present

## 2020-01-04 DIAGNOSIS — R29898 Other symptoms and signs involving the musculoskeletal system: Secondary | ICD-10-CM | POA: Diagnosis not present

## 2020-01-04 DIAGNOSIS — R2689 Other abnormalities of gait and mobility: Secondary | ICD-10-CM | POA: Diagnosis not present

## 2020-01-04 NOTE — Therapy (Addendum)
[place], [place], [address] Phone: [phone]   Fax:  [phone]  Physical Therapy Treatment  Patient Details  Name: Victor [name] MRN: [id number] Date of Birth: 1976/06/26 Referring Provider (PT): Arther Abbott, MD   Encounter Date: 01/04/2020  PT End of Session - 01/04/20 1220    Visit Number  4    Number of Visits  12    Date for PT Re-Evaluation  02/04/20    Authorization Type  Medicaid    Authorization Time Period  3 visits approved 5/3-5/16    Authorization - Visit Number  3    Authorization - Number of Visits  3    Progress Note Due on Visit  10    PT Start Time  1133    PT Stop Time  1215    PT Time Calculation (min)  42 min    Equipment Utilized During Treatment  Other (comment)   RT knee brace   Activity Tolerance  Patient tolerated treatment well    Behavior During Therapy  [facility] for tasks assessed/performed       Past Medical History:  Diagnosis Date  . GERD (gastroesophageal reflux disease)   . History of stomach ulcers   . Hypertension     Past Surgical History:  Procedure Laterality Date  . ANTERIOR CRUCIATE LIGAMENT REPAIR Right 05/19/2018   Procedure: RIGHT KNEE ARTHROSCOPY WITH ANTERIOR CRUCIATE LIGAMENT (ACL) REPAIR and MEDIAL MENISECTOMY;  Surgeon: [name], MD;  Location: [facility];  Service: Orthopedics;  Laterality: Right;  . APPENDECTOMY    . HARDWARE REMOVAL Right 09/21/2019   Procedure: HARDWARE REMOVAL (RICHARD'S STAPLE) RIGHT KNEE;  Surgeon: [name], MD;  Location: [facility];  Service: Orthopedics;  Laterality: Right;  . KNEE ARTHROSCOPY WITH MEDIAL MENISECTOMY Right 05/29/2017   Procedure: KNEE ARTHROSCOPY WITH MEDIAL MENISECTOMY and lateral meniscectomy;  Surgeon: [name], MD;  Location: [facility];  Service: Orthopedics;  Laterality: Right;  . KNEE ARTHROSCOPY WITH MEDIAL MENISECTOMY Right 02/05/2018   Procedure: RIGHT KNEE ARTHROSCOPY WITH  PARTIAL MEDIAL AND LATERAL MENISECTOMY;  Surgeon: [name], MD;  Location: [facility];  Service: Orthopedics;  Laterality: Right;    There were no vitals filed for this visit.  Subjective Assessment - 01/04/20 1139    Subjective  pt states his pain is 7/10 in his lumbar area today    Currently in Pain?  Yes    Pain Score  5     Pain Orientation  Lower;Mid    Pain Descriptors / Indicators  Aching;Sore                                 PT Short Term Goals - 01/04/20 1244      PT SHORT TERM GOAL #1   Title  Patient will report understanding and regular compliance with HEP to improve mobility and decrease overall pain.    Time  3    Period  Weeks    Status  On-going    Target Date  01/14/20      PT SHORT TERM GOAL #2   Title  Patient will report an improvement of at least 25% in overall subjective complaint for improved QoL.    Time  3    Period  Weeks    Status  On-going    Target Date  01/14/20  PT Long Term Goals - 01/04/20 1245      PT LONG TERM GOAL #1   Title  Patient will report an improvement of at least 50% in overall subjective complaint for improved QoL.    Time  6    Period  Weeks    Status  On-going      PT LONG TERM GOAL #2   Title  Patient will report that his back pain has not exceeded a 3/10 over the course of a 1 week period indicating improved tolerance to daily activities.    Time  6    Period  Weeks    Status  On-going      PT LONG TERM GOAL #3   Title  Patient will be able to sit for at least 30 minutes without increase in pain in order to complete a meal with improved ease and comfort.    Time  6    Period  Weeks    Status  On-going      PT LONG TERM GOAL #4   Title  Patient will perform lumbar AROM without increase in pain indicating improved lumbar mobility and ease of household activities.    Time  6    Period  Weeks    Status  On-going            Plan - 01/04/20 1245    Clinical Impression  Statement  Continued with focus on improving LE strength, flexibility and core stability.  Goals reviewed with no goals met, however pt has only had 3 visits at this point.   Pt with complaints of irritation completing prone press ups so only completed POE this session with hold of press ups.   manual completed at EOS with noted tightness Rt as compared to Lt.  pt hypersensitive initally but able to relax when mm relaxed in tension.  Pt reported no change at EOS with symptoms remaining the same and pain still at 5/10.  Pt has only completed 3 therapy sessions and would benefit from continued therapy to improve overall functional status and reduce pain.    Personal Factors and Comorbidities  Comorbidity 3+    Comorbidities  2 arthroscopic sugeries; ACL repair    Examination-Activity Limitations  Carry;Dressing;Lift;Locomotion Level;Squat;Stairs;Stand;Transfers    Examination-Participation Restrictions  Cleaning;Community Activity;Driving;Laundry;Meal Prep;Shop;Yard Work    [employer]  Evolving/Moderate complexity    Rehab Potential  Fair    PT Frequency  2x / week    PT Duration  6 weeks    PT Treatment/Interventions  ADLs/Self Care Home Management;Patient/family education;Manual techniques;Therapeutic exercise;Therapeutic activities;Functional mobility training;Balance training;Stair training;Gait training;Aquatic Therapy;Cryotherapy;Electrical Stimulation;Moist Heat;Traction;DME Instruction;Neuromuscular re-education;Orthotic Fit/Training;Passive range of motion;Dry needling;Energy conservation;Taping;Spinal Manipulations;Joint Manipulations    PT Next Visit Plan  Trial gentle spinal joint mobilizations for lower thoracic and lumbar spine for pain relief and STM to lumbar paraspinals. assess response to extension exercises. Continue to monitor Right knee as contributing to back pain. continue core strengtheing, and hip strengthening.    PT Home Exercise Plan  12/27/19 DKTC, LTR,  marching with ab set5/5/21 press ups, thoracic extension, clams    Consulted and Agree with Plan of Care  Patient       Patient will benefit from skilled therapeutic intervention in order to improve the following deficits and impairments:  Abnormal gait, Decreased activity tolerance, Decreased balance, Decreased strength, Decreased range of motion, Difficulty walking, Pain, Improper body mechanics, Decreased mobility, Increased muscle spasms, Hypomobility  Visit Diagnosis: Pain in right leg  Chronic bilateral low back pain, unspecified whether sciatica present  Muscle weakness (generalized)     Problem List Patient Active Problem List   Diagnosis Date Noted  . S/P hardware removal right knee 09/21/19 10/04/2019  . Essential hypertension 04/07/2019  . Family history of early CAD 04/07/2019  . History of chest pain 03/10/2019  . Hyperlipidemia 03/10/2019  . Tobacco abuse 03/10/2019  . S/P right knee arthroscopy 02/05/18 06/23/2017  . S/P ACL repair right 05/19/18   . Chondromalacia of lateral femoral condyle, right   . Chondromalacia, patella, right    Teena Irani, PTA/CLT [phone]  Teena Irani 01/05/2020, 8:15 AM  [place], [place], [address] Phone: [phone]   Fax:  [phone]  Name: Victor [name] MRN: [id number] Date of Birth: Sep 07, 1975

## 2020-01-06 ENCOUNTER — Ambulatory Visit (HOSPITAL_COMMUNITY): Payer: Medicaid Other | Admitting: Physical Therapy

## 2020-01-11 ENCOUNTER — Ambulatory Visit (HOSPITAL_COMMUNITY): Payer: Medicaid Other | Admitting: Physical Therapy

## 2020-01-11 ENCOUNTER — Encounter (HOSPITAL_COMMUNITY): Payer: Self-pay | Admitting: Physical Therapy

## 2020-01-11 ENCOUNTER — Other Ambulatory Visit: Payer: Self-pay

## 2020-01-11 DIAGNOSIS — M79604 Pain in right leg: Secondary | ICD-10-CM | POA: Diagnosis not present

## 2020-01-11 DIAGNOSIS — M545 Low back pain: Secondary | ICD-10-CM | POA: Diagnosis not present

## 2020-01-11 DIAGNOSIS — R29898 Other symptoms and signs involving the musculoskeletal system: Secondary | ICD-10-CM | POA: Diagnosis not present

## 2020-01-11 DIAGNOSIS — M6281 Muscle weakness (generalized): Secondary | ICD-10-CM | POA: Diagnosis not present

## 2020-01-11 DIAGNOSIS — G8929 Other chronic pain: Secondary | ICD-10-CM | POA: Diagnosis not present

## 2020-01-11 DIAGNOSIS — R2689 Other abnormalities of gait and mobility: Secondary | ICD-10-CM | POA: Diagnosis not present

## 2020-01-11 NOTE — Therapy (Signed)
[place], [place], [address] Phone: [phone]   Fax:  [phone]  Physical Therapy Treatment/Discharge Summary  Patient Details  Name: Victor [name] MRN: [id number] Date of Birth: 1976-08-21 Referring Provider (PT): Arther Abbott, MD   Encounter Date: 01/11/2020   PHYSICAL THERAPY DISCHARGE SUMMARY  Visits from Start of Care: 5  Current functional level related to goals / functional outcomes: See below   Remaining deficits: See below   Education / Equipment: See below  Plan: Patient agrees to discharge.  Patient goals were met. Patient is being discharged due to meeting the stated rehab goals.  ?????       PT End of Session - 01/11/20 1425    Visit Number  5    Number of Visits  12    Date for PT Re-Evaluation  02/04/20    Authorization Type  Medicaid    Authorization Time Period  10 visits approved 5/17-6/20    Authorization - Visit Number  1    Authorization - Number of Visits  10    Progress Note Due on Visit  10    PT Start Time  4585    PT Stop Time  1420    PT Time Calculation (min)  32 min    Equipment Utilized During Treatment  Other (comment)   RT knee brace   Activity Tolerance  Patient tolerated treatment well    Behavior During Therapy  WFL for tasks assessed/performed       Past Medical History:  Diagnosis Date  . GERD (gastroesophageal reflux disease)   . History of stomach ulcers   . Hypertension     Past Surgical History:  Procedure Laterality Date  . ANTERIOR CRUCIATE LIGAMENT REPAIR Right 05/19/2018   Procedure: RIGHT KNEE ARTHROSCOPY WITH ANTERIOR CRUCIATE LIGAMENT (ACL) REPAIR and MEDIAL MENISECTOMY;  Surgeon: [name], MD;  Location: [facility];  Service: Orthopedics;  Laterality: Right;  . APPENDECTOMY    . HARDWARE REMOVAL Right 09/21/2019   Procedure: HARDWARE REMOVAL (RICHARD'S STAPLE) RIGHT KNEE;  Surgeon: [name], MD;  Location: [facility];   Service: Orthopedics;  Laterality: Right;  . KNEE ARTHROSCOPY WITH MEDIAL MENISECTOMY Right 05/29/2017   Procedure: KNEE ARTHROSCOPY WITH MEDIAL MENISECTOMY and lateral meniscectomy;  Surgeon: [name], MD;  Location: [facility];  Service: Orthopedics;  Laterality: Right;  . KNEE ARTHROSCOPY WITH MEDIAL MENISECTOMY Right 02/05/2018   Procedure: RIGHT KNEE ARTHROSCOPY WITH PARTIAL MEDIAL AND LATERAL MENISECTOMY;  Surgeon: [name], MD;  Location: [facility];  Service: Orthopedics;  Laterality: Right;    There were no vitals filed for this visit.  Subjective Assessment - 01/11/20 1350    Subjective  Patient states the exercises he was doing at home hurt his leg from last session. Patient states his back has not been bothering him much lately. His knee hurts with exercises. He has f/u with knee next week. Patient feels limited by his knee mostly. He states 75% improvement with physical therapy intervention but feels just limited with stiffness.    Limitations  Sitting;Lifting;Standing;Walking;House hold activities    How long can you sit comfortably?  unrestricted from back    Currently in Pain?  Yes    Pain Score  1     Pain Location  Back         [facility] PT Assessment - 01/11/20 0001      Assessment   Medical Diagnosis  Radicular Pain of  Right LE    Referring Provider (PT)  Arther Abbott, MD    Next MD Visit  01/19/20      Restrictions   Weight Bearing Restrictions  No      Home Environment   Living Environment  Private residence    Living Arrangements  Spouse/significant other    Available Help at Discharge  Family    Type of [place] to enter    Entrance Stairs-Number of Steps  3      Prior Function   Level of Independence  Independent    Vocation  Unemployed    Leisure  wood work       Cognition   Overall Cognitive Status  Within Functional Limits for tasks assessed      Sensation   Light Touch  Appears Intact      AROM    Lumbar Flexion  0% limited, tight    Lumbar Extension  0% limited    Lumbar - Right Side Bend  0% limited, tight    Lumbar - Left Side Bend  0% limited, tight    Lumbar - Right Rotation  0% limited    Lumbar - Left Rotation  0% limited      Strength   Right Hip Flexion  4+/5    Left Hip Flexion  5/5    Right Knee Flexion  4/5    Right Knee Extension  4/5    Left Knee Flexion  5/5    Left Knee Extension  5/5    Right Ankle Dorsiflexion  5/5      Ambulation/Gait   Ambulation/Gait  Yes    Ambulation Distance (Feet)  100 Feet    Assistive device  None    Gait Pattern  Decreased stance time - right;Decreased step length - left;Antalgic                            PT Education - 01/11/20 1424    Education Details  Review of HEP, educated on continuing HEP, progress made for low back, knee pathology, mechanics of exercise, returning to physical therapy if needed    Person(s) Educated  Patient    Methods  Explanation;Demonstration    Comprehension  Verbalized understanding;Returned demonstration       PT Short Term Goals - 01/11/20 1405      PT SHORT TERM GOAL #1   Title  Patient will report understanding and regular compliance with HEP to improve mobility and decrease overall pain.    Time  3    Period  Weeks    Status  Achieved    Target Date  01/14/20      PT SHORT TERM GOAL #2   Title  Patient will report an improvement of at least 25% in overall subjective complaint for improved QoL.    Baseline  75% back, 0% knee    Time  3    Period  Weeks    Status  Achieved    Target Date  01/14/20        PT Long Term Goals - 01/11/20 1405      PT LONG TERM GOAL #1   Title  Patient will report an improvement of at least 50% in overall subjective complaint for improved QoL.    Baseline  75% back, 0% knee    Time  6    Period  Weeks  Status  Achieved      PT LONG TERM GOAL #2   Title  Patient will report that his back pain has not exceeded a 3/10 over  the course of a 1 week period indicating improved tolerance to daily activities.    Time  6    Period  Weeks    Status  Achieved      PT LONG TERM GOAL #3   Title  Patient will be able to sit for at least 30 minutes without increase in pain in order to complete a meal with improved ease and comfort.    Time  6    Period  Weeks    Status  Achieved      PT LONG TERM GOAL #4   Title  Patient will perform lumbar AROM without increase in pain indicating improved lumbar mobility and ease of household activities.    Time  6    Period  Weeks    Status  Achieved            Plan - 01/11/20 1427    Clinical Impression Statement  Patient has met all short and long term goals for low back at this time with improved symptoms, improved LE strength, ability to complete HEP, improved lumbar ROM, and improved functional mobility. Patient continues to have knee pain and be limited by knee pain with ADL. Patient educated on returning to physical therapy if needed and completing HEP. He is also educated on formal physical therapy possibly helping his knee depending on what MD and he discuss at follow up next week. Patient discharged from physical therapy at this time.    Personal Factors and Comorbidities  Comorbidity 3+    Comorbidities  2 arthroscopic sugeries; ACL repair    Examination-Activity Limitations  Carry;Dressing;Lift;Locomotion Level;Squat;Stairs;Stand;Transfers    Examination-Participation Restrictions  Cleaning;Community Activity;Driving;Laundry;Meal Prep;Shop;Yard Work    Physicist, medical    PT Treatment/Interventions  ADLs/Self Care Home Management;Patient/family education;Manual techniques;Therapeutic exercise;Therapeutic activities;Functional mobility training;Balance training;Stair training;Gait training;Aquatic Therapy;Cryotherapy;Electrical Stimulation;Moist Heat;Traction;DME Instruction;Neuromuscular re-education;Orthotic Fit/Training;Passive range of motion;Dry needling;Energy  conservation;Taping;Spinal Manipulations;Joint Manipulations    PT Next Visit Plan  n/a    PT Home Exercise Plan  12/27/19 [facility], LTR, marching with ab set5/5/21 press ups, thoracic extension, clams    Consulted and Agree with Plan of Care  Patient       Patient will benefit from skilled therapeutic intervention in order to improve the following deficits and impairments:  Abnormal gait, Decreased activity tolerance, Decreased balance, Decreased strength, Decreased range of motion, Difficulty walking, Pain, Improper body mechanics, Decreased mobility, Increased muscle spasms, Hypomobility  Visit Diagnosis: Pain in right leg  Chronic bilateral low back pain, unspecified whether sciatica present  Muscle weakness (generalized)  Other symptoms and signs involving the musculoskeletal system     Problem List Patient Active Problem List   Diagnosis Date Noted  . S/P hardware removal right knee 09/21/19 10/04/2019  . Essential hypertension 04/07/2019  . Family history of early CAD 04/07/2019  . History of chest pain 03/10/2019  . Hyperlipidemia 03/10/2019  . Tobacco abuse 03/10/2019  . S/P right knee arthroscopy 02/05/18 06/23/2017  . S/P ACL repair right 05/19/18   . Chondromalacia of lateral femoral condyle, right   . Chondromalacia, patella, right     2:32 PM, 01/11/20 Mearl Latin PT, DPT Physical Therapist at [facility], [place], [address] Phone: [phone]  Fax:  [phone]  Name: Victor [name] MRN: [id number] Date of Birth: 1976-01-02

## 2020-01-13 ENCOUNTER — Ambulatory Visit (HOSPITAL_COMMUNITY): Payer: Medicaid Other | Admitting: Physical Therapy

## 2020-01-13 ENCOUNTER — Encounter (HOSPITAL_COMMUNITY): Payer: Self-pay

## 2020-01-18 ENCOUNTER — Encounter (HOSPITAL_COMMUNITY): Payer: Medicaid Other | Admitting: Physical Therapy

## 2020-01-19 ENCOUNTER — Encounter: Payer: Self-pay | Admitting: Orthopedic Surgery

## 2020-01-19 ENCOUNTER — Other Ambulatory Visit: Payer: Self-pay

## 2020-01-19 ENCOUNTER — Ambulatory Visit: Payer: Medicaid Other | Admitting: Orthopedic Surgery

## 2020-01-19 VITALS — BP 137/91 | HR 90 | Ht 71.0 in | Wt 250.0 lb

## 2020-01-19 DIAGNOSIS — M5410 Radiculopathy, site unspecified: Secondary | ICD-10-CM | POA: Diagnosis not present

## 2020-01-19 DIAGNOSIS — Z9889 Other specified postprocedural states: Secondary | ICD-10-CM | POA: Diagnosis not present

## 2020-01-19 NOTE — Progress Notes (Addendum)
Chief Complaint  Patient presents with  . Knee Pain    has pain right knee    44 year old male status post right knee arthroscopy medial meniscectomy October 2018 and then in June 2019 had partial medial and lateral meniscectomies followed by September 2019 ACL allograft followed by hardware removal January 2021  He has had his physical therapy for his back and right leg radicular symptoms but still complains of severe pain instability and catching right knee with medial and lateral joint pain pain when sitting  Review of systems he complains of cramping in his feet with medial leg pain sharp pain from his right lower back along the lateral side of his right leg  Recommend MRI of his lumbar spine he will probably need epidural injections and I also want a rule out a surgical cause of his knee pain.  We discussed this at length  He will need a total knee replacement as well  I would like to get his pain better controlled with perhaps epidural injection  He wanted something for insomnia and we gave him Benadryl recommendation over-the-counter 50 mg  Follow-up after MRI  Encounter Diagnoses  Name Primary?  . S/P hardware removal right knee 09/21/19 Yes  . Radicular pain of right lower extremity    01/25/2020 addended   As noted previously:  Back Exam   Tenderness  The patient is experiencing tenderness in the lumbar.  Range of Motion  Extension: abnormal  Flexion: normal  Lateral bend right: normal  Lateral bend left: normal   Muscle Strength  Right Quadriceps:  5/5  Left Quadriceps:  5/5  Right Hamstrings:  5/5  Left Hamstrings:  5/5   Reflexes  Patellar: normal Achilles: normal Babinski's sign: normal   Other  Toe walk: normal Heel walk: normal Sensation: decreased Gait: abnormal  Erythema: no back redness Scars: present  Comments:  Sensory loss lateral leg L5  slt positive right negative left

## 2020-01-20 ENCOUNTER — Encounter (HOSPITAL_COMMUNITY): Payer: Medicaid Other | Admitting: Physical Therapy

## 2020-01-25 ENCOUNTER — Telehealth: Payer: Self-pay | Admitting: Radiology

## 2020-01-25 ENCOUNTER — Encounter (HOSPITAL_COMMUNITY): Payer: Medicaid Other | Admitting: Physical Therapy

## 2020-01-25 NOTE — Telephone Encounter (Signed)
I addended

## 2020-01-25 NOTE — Telephone Encounter (Signed)
I have faxed the addended note.

## 2020-01-25 NOTE — Telephone Encounter (Addendum)
Lumbar MRI denied, your notes do not state he has had 6 weeks conservative treatment and they state the exam records do not include a detailed nervous system exam. Do you want to do an addendum to your previous note? Or see him in office in a few weeks and try again with new note ?

## 2020-01-27 ENCOUNTER — Encounter (HOSPITAL_COMMUNITY): Payer: Medicaid Other | Admitting: Physical Therapy

## 2020-01-28 ENCOUNTER — Telehealth: Payer: Self-pay | Admitting: Orthopedic Surgery

## 2020-01-28 NOTE — Telephone Encounter (Signed)
I sent additional information, thanks for letting him know  I will let him know if they continue to deny Or will call with appointment when approved.

## 2020-01-28 NOTE — Telephone Encounter (Signed)
Patient called about MRI - referral notes indicate information had been re-faxed yesterday, 01/27/20, which I did relay. Please advise patient of further information.

## 2020-02-01 ENCOUNTER — Encounter (HOSPITAL_COMMUNITY): Payer: Medicaid Other | Admitting: Physical Therapy

## 2020-02-03 ENCOUNTER — Encounter (HOSPITAL_COMMUNITY): Payer: Medicaid Other | Admitting: Physical Therapy

## 2020-02-22 MED ORDER — HYDROCODONE-ACETAMINOPHEN 5-325 MG PO TABS: 1.0000 | ORAL_TABLET | Freq: Four times a day (QID) | ORAL | 0 refills | Status: DC | PRN

## 2020-03-01 ENCOUNTER — Ambulatory Visit (HOSPITAL_COMMUNITY)
Admission: RE | Admit: 2020-03-01 | Discharge: 2020-03-01 | Disposition: A | Discharge status: Home / Self Care | Payer: Medicaid Other | Source: Ambulatory Visit | Attending: Orthopedic Surgery | Admitting: Orthopedic Surgery

## 2020-03-01 ENCOUNTER — Other Ambulatory Visit: Payer: Self-pay

## 2020-03-01 DIAGNOSIS — M5410 Radiculopathy, site unspecified: Secondary | ICD-10-CM | POA: Insufficient documentation

## 2020-03-01 DIAGNOSIS — M545 Low back pain: Secondary | ICD-10-CM | POA: Diagnosis not present

## 2020-03-02 ENCOUNTER — Encounter: Payer: Self-pay | Admitting: Orthopedic Surgery

## 2020-03-02 ENCOUNTER — Ambulatory Visit (INDEPENDENT_AMBULATORY_CARE_PROVIDER_SITE_OTHER): Payer: Medicaid Other | Admitting: Orthopedic Surgery

## 2020-03-02 VITALS — BP 148/96 | HR 81 | Ht 71.0 in | Wt 255.2 lb

## 2020-03-02 DIAGNOSIS — M5410 Radiculopathy, site unspecified: Secondary | ICD-10-CM | POA: Diagnosis not present

## 2020-03-02 DIAGNOSIS — M1711 Unilateral primary osteoarthritis, right knee: Secondary | ICD-10-CM

## 2020-03-02 DIAGNOSIS — M25561 Pain in right knee: Secondary | ICD-10-CM | POA: Diagnosis not present

## 2020-03-02 DIAGNOSIS — F17210 Nicotine dependence, cigarettes, uncomplicated: Secondary | ICD-10-CM

## 2020-03-02 DIAGNOSIS — G8929 Other chronic pain: Secondary | ICD-10-CM

## 2020-03-02 DIAGNOSIS — Z9889 Other specified postprocedural states: Secondary | ICD-10-CM | POA: Diagnosis not present

## 2020-03-02 MED ORDER — NICOTINE 14 MG/24HR TD PT24: 14.0000 mg | MEDICATED_PATCH | Freq: Every day | TRANSDERMAL | 0 refills | Status: DC

## 2020-03-02 NOTE — Progress Notes (Signed)
  Chief Complaint  Patient presents with  . MRI results    No diagnosis found.  Chronic knee pain, surgery scheduled today.  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  Most of the pain at this point I would say is coming from his knee probably 80%  He will undergo right total knee replacement surgery he understands he Barton need pain management services afterwards if pain medication required after 6 weeks  We will stop smoking we will add nicotine patch she will go on a low-carb diet for the next 2 weeks and we will arrange his surgery in a 2-week.  Encounter Diagnoses  Name Primary?  . S/P hardware removal right knee 09/21/19   . Radicular pain of right lower extremity   . Chronic pain of right knee   . Primary osteoarthritis of right knee Yes  . S/P ACL repair right 05/19/18   . S/P right knee arthroscopy 02/05/18

## 2020-03-02 NOTE — Patient Instructions (Signed)
No carb diet 2 weeks   Stop smoking

## 2020-03-10 ENCOUNTER — Telehealth: Payer: Self-pay | Admitting: Orthopedic Surgery

## 2020-03-10 NOTE — Telephone Encounter (Signed)
Victor [name] called today asking to speak to you about when/or if, his surgery has been scheduled.  I told him that you were not in the office today. He said he forgot that you were out of the office this week.  He asks that you call him next week once this has all been done.  I told him that I knew that you would do so.  Thanks

## 2020-03-13 ENCOUNTER — Other Ambulatory Visit: Payer: Self-pay | Admitting: Orthopedic Surgery

## 2020-03-13 NOTE — Telephone Encounter (Signed)
right total knee replacement surgery   I will call him this afternoon.

## 2020-03-13 NOTE — Telephone Encounter (Signed)
Scheduled him for 03/28/20

## 2020-03-20 ENCOUNTER — Encounter: Payer: Self-pay | Admitting: Family Medicine

## 2020-03-20 ENCOUNTER — Ambulatory Visit: Payer: Medicaid Other | Admitting: Family Medicine

## 2020-03-20 ENCOUNTER — Other Ambulatory Visit: Payer: Self-pay

## 2020-03-20 VITALS — BP 133/91 | HR 80 | Temp 99.3°F | Ht 71.0 in | Wt 253.0 lb

## 2020-03-20 DIAGNOSIS — D492 Neoplasm of unspecified behavior of bone, soft tissue, and skin: Secondary | ICD-10-CM | POA: Diagnosis not present

## 2020-03-20 NOTE — Patient Instructions (Signed)
Victor [name]  03/20/2020     @PREFPERIOPPHARMACY @   Your procedure is scheduled on  03/28/2020.  Report to [facility] at  [phone]  A.M.  Call this number if you have problems the morning of surgery:  [phone]   Remember:  Do not eat or drink after midnight.                        Take these medicines the morning of surgery with A SIP OF WATER amlodipine, hydrocodone(if needed).    Do not wear jewelry, make-up or nail polish.  Do not wear lotions, powders, or perfumes. Please wear deodorant and brush your teeth.  Do not shave 48 hours prior to surgery.  Men Grayer shave face and neck.  Do not bring valuables to the hospital.  [facility] is not responsible for any belongings or valuables.  Contacts, dentures or bridgework Machamer not be worn into surgery.  Leave your suitcase in the car.  After surgery it Sitton be brought to your room.  For patients admitted to the hospital, discharge time will be determined by your treatment team.  Patients discharged the day of surgery will not be allowed to drive home.   Name and phone number of your driver:   family Special instructions:  DO NOT smoke the morning of your procedure  Please read over the following fact sheets that you were given. Anesthesia Post-op Instructions and Care and Recovery After Surgery       Total Knee Replacement, Care After This sheet gives you information about how to care for yourself after your procedure. Your health care provider Tenorio also give you more specific instructions. If you have problems or questions, contact your health care provider. What can I expect after the procedure? After the procedure, it is common to have:  Pain.  Swelling.  A small amount of blood or clear fluid coming from your incision.  Limited range of motion. Follow these instructions at home: Medicines  Take over-the-counter and prescription medicines only as told by your health care provider.  If you were  prescribed a blood thinner (anticoagulant), take it as told by your health care provider.  Ask your health care provider if the medicine prescribed to you: ? Requires you to avoid driving or using heavy machinery. ? Can cause constipation. You Gene need to take actions to prevent or treat constipation, such as:  Drink enough fluid to keep your urine pale yellow.  Take over-the-counter or prescription medicines.  Eat foods that are high in fiber, such as beans, whole grains, and fresh fruits and vegetables.  Limit foods that are high in fat and processed sugars, such as fried or sweet foods. Bathing  Do not take baths, swim, or use a hot tub until your health care provider approves. Ask your health care provider if you Dwyer take showers. You Gomes only be allowed to take sponge baths.  Keep your bandage (dressing) dry until your health care provider says it can be removed. Incision care and drain care   Follow instructions from your health care provider about how to take care of your incision. Make sure you: ? Wash your hands with soap and water before and after you change your dressing. If soap and water are not available, use hand sanitizer. ? Change your dressing as told by your health care provider. ? Leave stitches (sutures), skin glue, or adhesive  strips in place. These skin closures Broberg need to stay in place for 2 weeks or longer. If adhesive strip edges start to loosen and curl up, you Doty trim the loose edges. Do not remove adhesive strips completely unless your health care provider tells you to do that.  Check your incision area and drain site every day for signs of infection. Check for: ? More redness, swelling, or pain. ? More fluid or blood. ? Warmth. ? Pus or a bad smell.  If you have a drain, follow instructions from your health care provider about caring for it. Managing pain, stiffness, and swelling      If directed, put ice on your knee. ? Put ice in a plastic bag  or use the icing device (cold flow pad or cryocuff) that you were given. Follow instructions from your health care provider about how to use the icing device. ? Place a towel between your skin and the bag or between your skin and the icing device. ? Leave the ice on for 20 minutes, 2-3 times per day.  If directed, apply heat to the affected area before you exercise. Use the heat source that your health care provider recommends, such as a moist heat pack or a heating pad. ? Place a towel between your skin and the heat source. ? Leave the heat on for 20-30 minutes. ? Remove the heat if your skin turns bright red. This is especially important if you are unable to feel pain, heat, or cold. You [place] have a greater risk of getting burned.  Move your toes often to avoid stiffness and to lessen swelling.  Raise (elevate) your leg above the level of your heart while you are sitting or lying down. ? Use several pillows to keep your leg straight. ? Do not put a pillow just under the knee. If the knee is bent for a long time, this Isaza lead to stiffness.  Wear elastic knee support as told by your health care provider. Activity  Rest as told by your health care provider.  Avoid sitting for a long time without moving. Get up to take short walks every 1-2 hours. This is important to improve blood flow and breathing. Ask for help if you feel weak or unsteady.  Ask your health care provider what activities are safe for you.  Avoid high-impact activities, including running, jumping rope, and jumping jacks.  Do not play contact sports until your health care provider approves.  Do exercises as told by your physical therapist.  If you have been sent home with a continuous passive motion machine, use it as told by your health care provider. Safety   Do not use your leg to support your body weight until your health care provider approves. Use crutches or a walker as told by your health care provider.  Do  not drive until your health care provider approves. Ask your health care provider when it is safe to drive. General instructions  Do not use any products that contain nicotine or tobacco, such as cigarettes, e-cigarettes, and chewing tobacco. These can delay healing after surgery. If you need help quitting, ask your health care provider.  Wear compression stockings as told by your health care provider.  Tell your health care provider if you plan to have dental work. Also: ? Tell your dentist about your joint replacement. ? Ask your health care provider if there are any special instructions you need to follow before having dental care and routine cleanings.  Keep all follow-up visits as told by your health care provider. This is important. Contact a health care provider if you have:  More redness, swelling, or pain around your incision or drain.  More fluid or blood coming from your incision or drain.  Pus or a bad smell coming from your incision or drain.  Warmth on your incision or drain site.  A fever.  An incision that breaks open.  Knee pain that does not go away.  Range of motion in your knee that is getting worse.  A prosthesis that feels loose. Get help right away if you have:  Pain or swelling in your calf or thigh.  Shortness of breath or difficulty breathing.  Chest pain. Summary  After the procedure, it is common to have pain and swelling, blood or fluid coming from your incision, and limited range of motion.  Follow instructions from your health care provider about how to take care of your incision.  Use crutches or a walker as told by your health care provider.  If you were prescribed a blood thinner (anticoagulant), take it as told by your health care provider.  Keep all follow-up visits as told by your health care provider. This is important. This information is not intended to replace advice given to you by your health care provider. Make sure you  discuss any questions you have with your health care provider. Document Revised: 12/21/2018 Document Reviewed: 03/26/2018 Elsevier Patient Education  2020 [place] Anesthesia, Adult, Care After This sheet gives you information about how to care for yourself after your procedure. Your health care provider Burkey also give you more specific instructions. If you have problems or questions, contact your health care provider. What can I expect after the procedure? After the procedure, the following side effects are common:  Pain or discomfort at the IV site.  Nausea.  Vomiting.  Sore throat.  Trouble concentrating.  Feeling cold or chills.  Weak or tired.  Sleepiness and fatigue.  Soreness and body aches. These side effects can affect parts of the body that were not involved in surgery. Follow these instructions at home:  For at least 24 hours after the procedure:  Have a responsible adult stay with you. It is important to have someone help care for you until you are awake and alert.  Rest as needed.  Do not: ? Participate in activities in which you could fall or become injured. ? Drive. ? Use heavy machinery. ? Drink alcohol. ? Take sleeping pills or medicines that cause drowsiness. ? Make important decisions or sign legal documents. ? Take care of children on your own. Eating and drinking  Follow any instructions from your health care provider about eating or drinking restrictions.  When you feel hungry, start by eating small amounts of foods that are soft and easy to digest (bland), such as toast. Gradually return to your regular diet.  Drink enough fluid to keep your urine pale yellow.  If you vomit, rehydrate by drinking water, juice, or clear broth. General instructions  If you have sleep apnea, surgery and certain medicines can increase your risk for breathing problems. Follow instructions from your health care provider about wearing your sleep  device: ? Anytime you are sleeping, including during daytime naps. ? While taking prescription pain medicines, sleeping medicines, or medicines that make you drowsy.  Return to your normal activities as told by your health care provider. Ask your health care provider what activities are safe for  you.  Take over-the-counter and prescription medicines only as told by your health care provider.  If you smoke, do not smoke without supervision.  Keep all follow-up visits as told by your health care provider. This is important. Contact a health care provider if:  You have nausea or vomiting that does not get better with medicine.  You cannot eat or drink without vomiting.  You have pain that does not get better with medicine.  You are unable to pass urine.  You develop a skin rash.  You have a fever.  You have redness around your IV site that gets worse. Get help right away if:  You have difficulty breathing.  You have chest pain.  You have blood in your urine or stool, or you vomit blood. Summary  After the procedure, it is common to have a sore throat or nausea. It is also common to feel tired.  Have a responsible adult stay with you for the first 24 hours after general anesthesia. It is important to have someone help care for you until you are awake and alert.  When you feel hungry, start by eating small amounts of foods that are soft and easy to digest (bland), such as toast. Gradually return to your regular diet.  Drink enough fluid to keep your urine pale yellow.  Return to your normal activities as told by your health care provider. Ask your health care provider what activities are safe for you. This information is not intended to replace advice given to you by your health care provider. Make sure you discuss any questions you have with your health care provider. Document Revised: 08/15/2017 Document Reviewed: 03/28/2017 Elsevier Patient Education  [place].  Spinal Anesthesia and Epidural Anesthesia, Care After This sheet gives you information about how to care for yourself after your procedure. Your doctor Latulippe also give you more specific instructions. If you have problems or questions, call your doctor. Follow these instructions at home: For at least 24 hours after the procedure:   Have a responsible adult stay with you. It is important to have someone help care for you until you are awake and alert.  Rest as needed.  Do not do activities where you could fall or get hurt (injured).  Do not drive.  Do not use heavy machinery.  Do not drink alcohol.  Do not take sleeping pills or medicines that make you sleepy (drowsy).  Do not make important decisions.  Do not sign legal documents.  Do not take care of children on your own. Eating and drinking  If you throw up (vomit), drink water, juice, or soup when nausea and vomiting stop.  Drink enough fluid to keep your pee (urine) pale yellow.  Make sure you do not feel like throwing up (nauseous) before you eat solid foods.  Follow the diet that your doctor recommends. General instructions  Return to your normal activities as told by your doctor. Ask your doctor what activities are safe for you.  Take over-the-counter and prescription medicines only as told by your doctor.  If you have sleep apnea, surgery and certain medicines can raise your risk for breathing problems. Follow instructions from your doctor about when to wear your sleep device. Your doctor Lebron tell you to wear your sleep device: ? Anytime you are sleeping, including during daytime naps. ? While taking prescription pain medicines, sleeping pills, or medicines that make you sleepy.  Do not use any products that contain nicotine or tobacco.  This includes cigarettes and e-cigarettes. ? If you need help quitting, ask your doctor. ? If you smoke, do not smoke by yourself. Make sure someone is nearby in case you  need help.  Keep all follow-up visits as told by your doctor. This is important. Contact a doctor if:  It has been more than one day since your procedure and you feel like throwing up.  It has been more than one day since your procedure and you throw up.  You have a rash. Get help right away if:  You have a fever.  You have a headache that lasts a long time.  You have a very bad headache.  Your vision is blurry.  You see two of a single object (double vision).  You are dizzy or light-headed.  You faint.  Your arms or legs tingle, feel weak, or get numb.  You have trouble breathing.  You cannot pee (urinate). Summary  After the procedure, have a responsible adult stay with you at home until you are fully awake and alert.  Do not do activities that might get you injured. Do not drive, use heavy machinery, drink alcohol, or make important decisions for 24 hours after the procedure.  Take medicines as told by your doctor. Do not use products that contain nicotine or tobacco.  Get help right away if you have a fever, blurry vision, difficulty breathing or passing urine, or weakness or numbness in arms or legs. This information is not intended to replace advice given to you by your health care provider. Make sure you discuss any questions you have with your health care provider. Document Revised: 07/25/2017 Document Reviewed: 12/04/2015 Elsevier Patient Education  [place]. How to Use Chlorhexidine for Bathing Chlorhexidine gluconate (CHG) is a germ-killing (antiseptic) solution that is used to clean the skin. It can get rid of the bacteria that normally live on the skin and can keep them away for about 24 hours. To clean your skin with CHG, you Carvey be given:  A CHG solution to use in the shower or as part of a sponge bath.  A prepackaged cloth that contains CHG. Cleaning your skin with CHG Sayre help lower the risk for infection:  While you are staying in the  intensive care unit of the hospital.  If you have a vascular access, such as a central line, to provide short-term or long-term access to your veins.  If you have a catheter to drain urine from your bladder.  If you are on a ventilator. A ventilator is a machine that helps you breathe by moving air in and out of your lungs.  After surgery. What are the risks? Risks of using CHG include:  A skin reaction.  Hearing loss, if CHG gets in your ears.  Eye injury, if CHG gets in your eyes and is not rinsed out.  The CHG product catching fire. Make sure that you avoid smoking and flames after applying CHG to your skin. Do not use CHG:  If you have a chlorhexidine allergy or have previously reacted to chlorhexidine.  On babies younger than 19 months of age. How to use CHG solution  Use CHG only as told by your health care provider, and follow the instructions on the label.  Use the full amount of CHG as directed. Usually, this is one bottle. During a shower Follow these steps when using CHG solution during a shower (unless your health care provider gives you different instructions): 1. Start the  shower. 2. Use your normal soap and shampoo to wash your face and hair. 3. Turn off the shower or move out of the shower stream. 4. Pour the CHG onto a clean washcloth. Do not use any type of brush or rough-edged sponge. 5. Starting at your neck, lather your body down to your toes. Make sure you follow these instructions: ? If you will be having surgery, pay special attention to the part of your body where you will be having surgery. Scrub this area for at least 1 minute. ? Do not use CHG on your head or face. If the solution gets into your ears or eyes, rinse them well with water. ? Avoid your genital area. ? Avoid any areas of skin that have broken skin, cuts, or scrapes. ? Scrub your back and under your arms. Make sure to wash skin folds. 6. Let the lather sit on your skin for 1-2 minutes  or as long as told by your health care provider. 7. Thoroughly rinse your entire body in the shower. Make sure that all body creases and crevices are rinsed well. 8. Dry off with a clean towel. Do not put any substances on your body afterward--such as powder, lotion, or perfume--unless you are told to do so by your health care provider. Only use lotions that are recommended by the manufacturer. 9. Put on clean clothes or pajamas. 10. If it is the night before your surgery, sleep in clean sheets.  During a sponge bath Follow these steps when using CHG solution during a sponge bath (unless your health care provider gives you different instructions): 1. Use your normal soap and shampoo to wash your face and hair. 2. Pour the CHG onto a clean washcloth. 3. Starting at your neck, lather your body down to your toes. Make sure you follow these instructions: ? If you will be having surgery, pay special attention to the part of your body where you will be having surgery. Scrub this area for at least 1 minute. ? Do not use CHG on your head or face. If the solution gets into your ears or eyes, rinse them well with water. ? Avoid your genital area. ? Avoid any areas of skin that have broken skin, cuts, or scrapes. ? Scrub your back and under your arms. Make sure to wash skin folds. 4. Let the lather sit on your skin for 1-2 minutes or as long as told by your health care provider. 5. Using a different clean, wet washcloth, thoroughly rinse your entire body. Make sure that all body creases and crevices are rinsed well. 6. Dry off with a clean towel. Do not put any substances on your body afterward--such as powder, lotion, or perfume--unless you are told to do so by your health care provider. Only use lotions that are recommended by the manufacturer. 7. Put on clean clothes or pajamas. 8. If it is the night before your surgery, sleep in clean sheets. How to use CHG prepackaged cloths  Only use CHG cloths as  told by your health care provider, and follow the instructions on the label.  Use the CHG cloth on clean, dry skin.  Do not use the CHG cloth on your head or face unless your health care provider tells you to.  When washing with the CHG cloth: ? Avoid your genital area. ? Avoid any areas of skin that have broken skin, cuts, or scrapes. Before surgery Follow these steps when using a CHG cloth to clean before surgery (  unless your health care provider gives you different instructions): 1. Using the CHG cloth, vigorously scrub the part of your body where you will be having surgery. Scrub using a back-and-forth motion for 3 minutes. The area on your body should be completely wet with CHG when you are done scrubbing. 2. Do not rinse. Discard the cloth and let the area air-dry. Do not put any substances on the area afterward, such as powder, lotion, or perfume. 3. Put on clean clothes or pajamas. 4. If it is the night before your surgery, sleep in clean sheets.  For general bathing Follow these steps when using CHG cloths for general bathing (unless your health care provider gives you different instructions). 1. Use a separate CHG cloth for each area of your body. Make sure you wash between any folds of skin and between your fingers and toes. Wash your body in the following order, switching to a new cloth after each step: ? The front of your neck, shoulders, and chest. ? Both of your arms, under your arms, and your hands. ? Your stomach and groin area, avoiding the genitals. ? Your right leg and foot. ? Your left leg and foot. ? The back of your neck, your back, and your buttocks. 2. Do not rinse. Discard the cloth and let the area air-dry. Do not put any substances on your body afterward--such as powder, lotion, or perfume--unless you are told to do so by your health care provider. Only use lotions that are recommended by the manufacturer. 3. Put on clean clothes or pajamas. Contact a health  care provider if:  Your skin gets irritated after scrubbing.  You have questions about using your solution or cloth. Get help right away if:  Your eyes become very red or swollen.  Your eyes itch badly.  Your skin itches badly and is red or swollen.  Your hearing changes.  You have trouble seeing.  You have swelling or tingling in your mouth or throat.  You have trouble breathing.  You swallow any chlorhexidine. Summary  Chlorhexidine gluconate (CHG) is a germ-killing (antiseptic) solution that is used to clean the skin. Cleaning your skin with CHG [place] help to lower your risk for infection.  You Mathey be given CHG to use for bathing. It Dowdy be in a bottle or in a prepackaged cloth to use on your skin. Carefully follow your health care provider's instructions and the instructions on the product label.  Do not use CHG if you have a chlorhexidine allergy.  Contact your health care provider if your skin gets irritated after scrubbing. This information is not intended to replace advice given to you by your health care provider. Make sure you discuss any questions you have with your health care provider. Document Revised: 10/29/2018 Document Reviewed: 07/10/2017 Elsevier Patient Education  [place].

## 2020-03-20 NOTE — Progress Notes (Signed)
Assessment & Plan:  1. Abnormal skin growth - Reassured it is not infected. Referral placed to dermatology.  - Ambulatory referral to Dermatology   Follow up plan: Return if symptoms worsen or fail to improve.  Hendricks Limes, MSN, APRN, FNP-C [facility]  Subjective:   Patient ID: Victor [name], male    DOB: 02-07-1976, 44 y.o.   MRN: [id number]  HPI: Victor [name] is a 44 y.o. male presenting on 03/20/2020 for Mass (Patient states he has a growth on his right arm that has been there x 5-6 months ago.  Patient states it fell off and grew back bigger.)  Patient has a growth on his right arm that first appeared 5-6 months ago. Initially he treated it with a home remedy of vinegar and tea tree oil which caused it to fall off. When it returned, it came back larger. It does cause pain if he hits it or pushes on it. He is concerned today because he is scheduled to have knee surgery next week on 03/28/20 and wants to make sure it is not an infection. He reports a family history of various cancers, but not skin that he is aware of.    ROS: Negative unless specifically indicated above in HPI.   Relevant past medical history reviewed and updated as indicated.   Allergies and medications reviewed and updated.   Current Outpatient Medications:  .  amLODipine (NORVASC) 10 MG tablet, Take 1 tablet (10 mg total) by mouth daily., Disp: 180 tablet, Rfl: 3 .  atorvastatin (LIPITOR) 80 MG tablet, Take 1 tablet (80 mg total) by mouth daily., Disp: 90 tablet, Rfl: 3 .  HYDROcodone-acetaminophen (NORCO/VICODIN) 5-325 MG tablet, Take 1 tablet by mouth every 6 (six) hours as needed for moderate pain., Disp: 28 tablet, Rfl: 0 .  ibuprofen (ADVIL) 800 MG tablet, Take 1 tablet (800 mg total) by mouth every 8 (eight) hours as needed. (Patient taking differently: Take 800 mg by mouth every 8 (eight) hours as needed for moderate pain. ), Disp: 30 tablet, Rfl: 0 .  nicotine (NICODERM CQ) 14  mg/24hr patch, Place 1 patch (14 mg total) onto the skin daily., Disp: 28 patch, Rfl: 0  No Known Allergies  Objective:   BP (!) 133/91   Pulse 80   Temp 99.3 F (37.4 C) (Temporal)   Ht 5\' 11"  (1.803 m)   Wt (!) 253 lb (114.8 kg)   SpO2 96%   BMI 35.29 kg/m    Physical Exam Vitals reviewed.  Constitutional:      General: He is not in acute distress.    Appearance: Normal appearance. He is obese. He is not ill-appearing, toxic-appearing or diaphoretic.  HENT:     Head: Normocephalic and atraumatic.  Eyes:     General: No scleral icterus.       Right eye: No discharge.        Left eye: No discharge.     Conjunctiva/sclera: Conjunctivae normal.  Cardiovascular:     Rate and Rhythm: Normal rate.  Pulmonary:     Effort: Pulmonary effort is normal. No respiratory distress.  Musculoskeletal:        General: Normal range of motion.     Cervical back: Normal range of motion.  Skin:    General: Skin is warm and dry.     Comments: Growth to right forearm - black spot on the top where he tried to freeze it. Pictured below. Measures 21x15 mm.  Neurological:     Mental Status: He is alert and oriented to person, place, and time. Mental status is at baseline.  Psychiatric:        Mood and Affect: Mood normal.        Behavior: Behavior normal.        Thought Content: Thought content normal.        Judgment: Judgment normal.

## 2020-03-21 ENCOUNTER — Telehealth: Payer: Self-pay | Admitting: Family Medicine

## 2020-03-22 ENCOUNTER — Other Ambulatory Visit: Payer: Self-pay | Admitting: Orthopedic Surgery

## 2020-03-22 DIAGNOSIS — M1711 Unilateral primary osteoarthritis, right knee: Secondary | ICD-10-CM

## 2020-03-23 ENCOUNTER — Encounter (HOSPITAL_COMMUNITY)
Admission: RE | Admit: 2020-03-23 | Discharge: 2020-03-23 | Disposition: A | Discharge status: Home / Self Care | Payer: Medicaid Other | Source: Ambulatory Visit | Attending: Orthopedic Surgery | Admitting: Orthopedic Surgery

## 2020-03-23 ENCOUNTER — Encounter: Payer: Self-pay | Admitting: Family Medicine

## 2020-03-23 ENCOUNTER — Encounter (HOSPITAL_COMMUNITY): Payer: Self-pay

## 2020-03-23 ENCOUNTER — Other Ambulatory Visit: Payer: Self-pay

## 2020-03-23 DIAGNOSIS — Z01818 Encounter for other preprocedural examination: Secondary | ICD-10-CM | POA: Insufficient documentation

## 2020-03-23 LAB — CBC WITH DIFFERENTIAL/PLATELET
Abs Immature Granulocytes: 0.06 10*3/uL (ref 0.00–0.07)
Basophils Absolute: 0.1 10*3/uL (ref 0.0–0.1)
Basophils Relative: 1 %
Eosinophils Absolute: 0.1 10*3/uL (ref 0.0–0.5)
Eosinophils Relative: 1 %
HCT: 43.6 % (ref 39.0–52.0)
Hemoglobin: 14.6 g/dL (ref 13.0–17.0)
Immature Granulocytes: 1 %
Lymphocytes Relative: 21 %
Lymphs Abs: 2.6 10*3/uL (ref 0.7–4.0)
MCH: 30.1 pg (ref 26.0–34.0)
MCHC: 33.5 g/dL (ref 30.0–36.0)
MCV: 89.9 fL (ref 80.0–100.0)
Monocytes Absolute: 0.8 10*3/uL (ref 0.1–1.0)
Monocytes Relative: 6 %
Neutro Abs: 8.4 10*3/uL — ABNORMAL HIGH (ref 1.7–7.7)
Neutrophils Relative %: 70 %
Platelets: 269 10*3/uL (ref 150–400)
RBC: 4.85 MIL/uL (ref 4.22–5.81)
RDW: 13.4 % (ref 11.5–15.5)
WBC: 11.9 10*3/uL — ABNORMAL HIGH (ref 4.0–10.5)
nRBC: 0 % (ref 0.0–0.2)

## 2020-03-23 LAB — BASIC METABOLIC PANEL
Anion gap: 9 (ref 5–15)
BUN: 12 mg/dL (ref 6–20)
CO2: 26 mmol/L (ref 22–32)
Calcium: 8.9 mg/dL (ref 8.9–10.3)
Chloride: 102 mmol/L (ref 98–111)
Creatinine, Ser: 1.01 mg/dL (ref 0.61–1.24)
GFR calc Af Amer: >60 mL/min (ref >60)
GFR calc non Af Amer: >60 mL/min (ref >60)
Glucose, Bld: 116 mg/dL — ABNORMAL HIGH (ref 70–99)
Potassium: 3.8 mmol/L (ref 3.5–5.1)
Sodium: 137 mmol/L (ref 135–145)

## 2020-03-23 LAB — PREPARE RBC (CROSSMATCH)

## 2020-03-24 ENCOUNTER — Other Ambulatory Visit: Payer: Self-pay | Admitting: Orthopedic Surgery

## 2020-03-24 DIAGNOSIS — M1711 Unilateral primary osteoarthritis, right knee: Secondary | ICD-10-CM

## 2020-03-24 DIAGNOSIS — R2230 Localized swelling, mass and lump, unspecified upper limb: Secondary | ICD-10-CM

## 2020-03-24 NOTE — Telephone Encounter (Signed)
Send him for another cbc with diff

## 2020-03-27 ENCOUNTER — Other Ambulatory Visit (HOSPITAL_COMMUNITY)
Admission: RE | Admit: 2020-03-27 | Discharge: 2020-03-27 | Disposition: A | Discharge status: Home / Self Care | Payer: Medicaid Other | Source: Ambulatory Visit | Attending: Orthopedic Surgery | Admitting: Orthopedic Surgery

## 2020-03-27 ENCOUNTER — Other Ambulatory Visit: Payer: Self-pay | Admitting: Orthopedic Surgery

## 2020-03-27 ENCOUNTER — Other Ambulatory Visit: Payer: Self-pay

## 2020-03-27 DIAGNOSIS — M1711 Unilateral primary osteoarthritis, right knee: Secondary | ICD-10-CM | POA: Diagnosis not present

## 2020-03-27 DIAGNOSIS — Z20822 Contact with and (suspected) exposure to covid-19: Secondary | ICD-10-CM | POA: Diagnosis not present

## 2020-03-27 DIAGNOSIS — Z01812 Encounter for preprocedural laboratory examination: Secondary | ICD-10-CM | POA: Insufficient documentation

## 2020-03-27 DIAGNOSIS — R2230 Localized swelling, mass and lump, unspecified upper limb: Secondary | ICD-10-CM | POA: Diagnosis not present

## 2020-03-27 LAB — CBC WITH DIFFERENTIAL/PLATELET
Absolute Monocytes: 834 cells/uL (ref 200–950)
Basophils Absolute: 68 cells/uL (ref 0–200)
Basophils Relative: 0.7 %
Eosinophils Absolute: 194 cells/uL (ref 15–500)
Eosinophils Relative: 2 %
HCT: 45.5 % (ref 38.5–50.0)
Hemoglobin: 15.3 g/dL (ref 13.2–17.1)
Lymphs Abs: 2474 cells/uL (ref 850–3900)
MCH: 29.4 pg (ref 27.0–33.0)
MCHC: 33.6 g/dL (ref 32.0–36.0)
MCV: 87.5 fL (ref 80.0–100.0)
MPV: 10.3 fL (ref 7.5–12.5)
Monocytes Relative: 8.6 %
Neutro Abs: 6130 cells/uL (ref 1500–7800)
Neutrophils Relative %: 63.2 %
Platelets: 286 10*3/uL (ref 140–400)
RBC: 5.2 10*6/uL (ref 4.20–5.80)
RDW: 13 % (ref 11.0–15.0)
Total Lymphocyte: 25.5 %
WBC: 9.7 10*3/uL (ref 3.8–10.8)

## 2020-03-27 LAB — SARS CORONAVIRUS 2 (TAT 6-24 HRS): SARS Coronavirus 2: NEGATIVE

## 2020-03-27 NOTE — H&P (Signed)
TOTAL KNEE ADMISSION H&P  Patient is being admitted for right total knee arthroplasty.  Subjective:  Chief Complaint:right knee pain.  HPI: Victor [name], 44 y.o. male, has a history of pain and functional disability in the right knee due to arthritis and has failed non-surgical conservative treatments for greater than 12 weeks to includeNSAID's and/or analgesics, corticosteriod injections, flexibility and strengthening excercises, supervised PT with diminished ADL's post treatment, use of assistive devices, weight reduction as appropriate and activity modification.  Onset of symptoms was gradual, starting 5 years ago with gradually worsening course since that time. The patient noted Prior surgery including arthroscopy right knee medial meniscectomy in 2018 followed by arthroscopy medial lateral meniscectomy in 2019 June and then an ACL reconstruction in September 2019 on the right knee(s).  Patient currently rates pain in the right knee(s) at 9 out of 10 with activity. Patient has night pain, worsening of pain with activity and weight bearing, pain that interferes with activities of daily living, joint swelling and Giving way of the knee.  Patient has evidence of joint space narrowing and And arthroscopic confirmation of degeneration of the joint by imaging studies. This patient has had  There is no active infection.  Patient Active Problem List   Diagnosis Date Noted  . S/P hardware removal right knee 09/21/19 10/04/2019  . Essential hypertension 04/07/2019  . Family history of early CAD 04/07/2019  . History of chest pain 03/10/2019  . Hyperlipidemia 03/10/2019  . Tobacco abuse 03/10/2019  . S/P right knee arthroscopy 02/05/18 06/23/2017  . S/P ACL repair right 05/19/18   . Chondromalacia of lateral femoral condyle, right   . Chondromalacia, patella, right    Past Medical History:  Diagnosis Date  . GERD (gastroesophageal reflux disease)   . History of stomach ulcers   . Hypertension      Past Surgical History:  Procedure Laterality Date  . ANTERIOR CRUCIATE LIGAMENT REPAIR Right 05/19/2018   Procedure: RIGHT KNEE ARTHROSCOPY WITH ANTERIOR CRUCIATE LIGAMENT (ACL) REPAIR and MEDIAL MENISECTOMY;  Surgeon: [name], MD;  Location: [facility];  Service: Orthopedics;  Laterality: Right;  . APPENDECTOMY    . HARDWARE REMOVAL Right 09/21/2019   Procedure: HARDWARE REMOVAL (RICHARD'S STAPLE) RIGHT KNEE;  Surgeon: [name], MD;  Location: [facility];  Service: Orthopedics;  Laterality: Right;  . KNEE ARTHROSCOPY WITH MEDIAL MENISECTOMY Right 05/29/2017   Procedure: KNEE ARTHROSCOPY WITH MEDIAL MENISECTOMY and lateral meniscectomy;  Surgeon: [name], MD;  Location: [facility];  Service: Orthopedics;  Laterality: Right;  . KNEE ARTHROSCOPY WITH MEDIAL MENISECTOMY Right 02/05/2018   Procedure: RIGHT KNEE ARTHROSCOPY WITH PARTIAL MEDIAL AND LATERAL MENISECTOMY;  Surgeon: [name], MD;  Location: [facility];  Service: Orthopedics;  Laterality: Right;    No current facility-administered medications for this encounter.   Current Outpatient Medications  Medication Sig Dispense Refill Last Dose  . amLODipine (NORVASC) 10 MG tablet Take 1 tablet (10 mg total) by mouth daily. 180 tablet 3   . HYDROcodone-acetaminophen (NORCO/VICODIN) 5-325 MG tablet Take 1 tablet by mouth every 6 (six) hours as needed for moderate pain. 28 tablet 0   . ibuprofen (ADVIL) 800 MG tablet Take 1 tablet (800 mg total) by mouth every 8 (eight) hours as needed. (Patient taking differently: Take 800 mg by mouth every 8 (eight) hours as needed for moderate pain. ) 30 tablet 0   . nicotine (NICODERM CQ) 14 mg/24hr patch Place 1 patch (14 mg total) onto the skin daily.  28 patch 0   . atorvastatin (LIPITOR) 80 MG tablet Take 1 tablet (80 mg total) by mouth daily. 90 tablet 3    No Known Allergies  Social History   Tobacco Use  . Smoking status: Former Smoker    Packs/day: 0.50    Years: 15.00     Pack years: 7.50    Types: Cigarettes    Start date: 07/07/1991    Quit date: 04/09/2018    Years since quitting: 1.9  . Smokeless tobacco: Former Systems developer    Types: Chew    Quit date: 06/09/2018  Substance Use Topics  . Alcohol use: No    Family History  Problem Relation Age of Onset  . Diabetes Mother   . Heart failure Mother   . Cancer Mother   . Pancreatic cancer Mother   . Cancer Father   . Throat cancer Father   . Healthy Sister   . Head & neck cancer Brother   . Bone cancer Maternal Grandmother   . Diabetes Maternal Grandmother   . CAD Maternal Grandfather   . Brain cancer Maternal Grandfather   . Colon cancer Paternal Grandfather      Review of Systems  Musculoskeletal: Positive for arthralgias, back pain and gait problem.  Neurological: Positive for numbness.  All other systems reviewed and are negative.   Objective:  Physical Exam Is a well-developed well-nourished male rather large body frame 113 kg at 511  He is awake alert and oriented x3 mood pleasant affect flat  No anxiety  Gait shows no major abnormality when observed  Upper extremities are normal  Mild lower back pain especially on the right side and midline in L4-5 region  Right knee laxity in the ACL evaluation no collateral ligament instability medial joint line tenderness tenderness at the proximal portion of the patella muscle strength is normal skin is warm dry and intact no erythema no effusion range of motion is approximately 125 degrees Muma have a slight flexion contracture  Left knee asymptomatic and clinically exam unremarkable  Overall neurovascular function intact good pulse and perfusion distally slight sensory abnormality lateral side right leg related to the lumbar disease Vital signs in last 24 hours:    Labs:   Estimated body mass index is 34.87 kg/m as calculated from the following:   Height as of 03/23/20: 5\' 11"  (1.803 m).   Weight as of 03/23/20: 113.4 kg.   Imaging  Review Plain radiographs demonstrate moderate degenerative joint disease of the right knee(s). The overall alignment ismild valgus. The bone quality appears to be good for age and reported activity level.      Assessment/Plan:  End stage arthritis, right knee   The patient history, physical examination, clinical judgment of the provider and imaging studies are consistent with end stage degenerative joint disease of the right knee(s) and total knee arthroplasty is deemed medically necessary. The treatment options including medical management, injection therapy arthroscopy and arthroplasty were discussed at length. The risks and benefits of total knee arthroplasty were presented and reviewed. The risks due to aseptic loosening, infection, stiffness, patella tracking problems, thromboembolic complications and other imponderables were discussed. The patient acknowledged the explanation, agreed to proceed with the plan and consent was signed. Patient is being admitted for inpatient treatment for surgery, pain control, PT, OT, prophylactic antibiotics, VTE prophylaxis, progressive ambulation and ADL's and discharge planning. The patient is planning to be discharged home with home health services   Note: Patient has been on opioid therapy  on and off for several years now to try to control his pain he is aware that he will have some difficulties with pain management postoperatively and that we will try to get him off of his opioids by 6 weeks postop

## 2020-03-28 ENCOUNTER — Encounter (HOSPITAL_COMMUNITY)
Admission: RE | Disposition: A | Discharge status: Home / Self Care | Payer: Self-pay | Source: Home / Self Care | Attending: Orthopedic Surgery

## 2020-03-28 ENCOUNTER — Ambulatory Visit (HOSPITAL_COMMUNITY): Payer: Medicaid Other

## 2020-03-28 ENCOUNTER — Encounter (HOSPITAL_COMMUNITY): Payer: Self-pay | Admitting: Orthopedic Surgery

## 2020-03-28 ENCOUNTER — Ambulatory Visit (HOSPITAL_COMMUNITY): Payer: Medicaid Other | Admitting: Anesthesiology

## 2020-03-28 ENCOUNTER — Observation Stay (HOSPITAL_COMMUNITY)
Admission: RE | Admit: 2020-03-28 | Discharge: 2020-03-29 | Disposition: A | Discharge status: Home / Self Care | Payer: Medicaid Other | Attending: Orthopedic Surgery | Admitting: Orthopedic Surgery

## 2020-03-28 DIAGNOSIS — G8918 Other acute postprocedural pain: Secondary | ICD-10-CM | POA: Diagnosis not present

## 2020-03-28 DIAGNOSIS — Z79899 Other long term (current) drug therapy: Secondary | ICD-10-CM | POA: Diagnosis not present

## 2020-03-28 DIAGNOSIS — Z96651 Presence of right artificial knee joint: Secondary | ICD-10-CM | POA: Insufficient documentation

## 2020-03-28 DIAGNOSIS — M1711 Unilateral primary osteoarthritis, right knee: Secondary | ICD-10-CM | POA: Diagnosis not present

## 2020-03-28 DIAGNOSIS — Z471 Aftercare following joint replacement surgery: Secondary | ICD-10-CM | POA: Diagnosis not present

## 2020-03-28 DIAGNOSIS — M25561 Pain in right knee: Secondary | ICD-10-CM | POA: Diagnosis present

## 2020-03-28 DIAGNOSIS — Z87891 Personal history of nicotine dependence: Secondary | ICD-10-CM | POA: Insufficient documentation

## 2020-03-28 DIAGNOSIS — I10 Essential (primary) hypertension: Secondary | ICD-10-CM | POA: Insufficient documentation

## 2020-03-28 HISTORY — PX: TOTAL KNEE ARTHROPLASTY: SHX125

## 2020-03-28 LAB — ABO/RH: ABO/RH(D): A NEG

## 2020-03-28 SURGERY — ARTHROPLASTY, KNEE, TOTAL
Anesthesia: General | Site: Knee | Laterality: Right

## 2020-03-28 MED ORDER — POLYETHYLENE GLYCOL 3350 17 G PO PACK
17.0000 g | PACK | Freq: Every day | ORAL | Status: DC
  Administered 2020-03-28 – 2020-03-29 (×2): 17 g via ORAL
  Filled 2020-03-28 (×2): qty 1

## 2020-03-28 MED ORDER — METHOCARBAMOL 1000 MG/10ML IJ SOLN
500.0000 mg | Freq: Once | INTRAVENOUS | Status: DC
  Filled 2020-03-28: qty 5

## 2020-03-28 MED ORDER — MIDAZOLAM HCL 2 MG/2ML IJ SOLN
2.0000 mg | Freq: Once | INTRAMUSCULAR | Status: AC
  Administered 2020-03-28: 2 mg via INTRAVENOUS
  Filled 2020-03-28: qty 2

## 2020-03-28 MED ORDER — ONDANSETRON HCL 4 MG PO TABS: 4.0000 mg | ORAL_TABLET | Freq: Four times a day (QID) | ORAL | Status: DC | PRN

## 2020-03-28 MED ORDER — CHLORHEXIDINE GLUCONATE 0.12 % MT SOLN
15.0000 mL | Freq: Once | OROMUCOSAL | Status: AC
  Administered 2020-03-28: 15 mL via OROMUCOSAL

## 2020-03-28 MED ORDER — 0.9 % SODIUM CHLORIDE (POUR BTL) OPTIME
TOPICAL | Status: DC | PRN
  Administered 2020-03-28: 1000 mL

## 2020-03-28 MED ORDER — HYDROMORPHONE HCL 1 MG/ML IJ SOLN: 1.0000 mg | INTRAMUSCULAR | Status: DC | PRN

## 2020-03-28 MED ORDER — AMLODIPINE BESYLATE 5 MG PO TABS
10.0000 mg | ORAL_TABLET | Freq: Every day | ORAL | Status: DC
  Administered 2020-03-29: 10 mg via ORAL
  Filled 2020-03-28: qty 2

## 2020-03-28 MED ORDER — ONDANSETRON HCL 4 MG/2ML IJ SOLN
INTRAMUSCULAR | Status: AC
  Filled 2020-03-28: qty 2

## 2020-03-28 MED ORDER — GABAPENTIN 600 MG PO TABS
300.0000 mg | ORAL_TABLET | Freq: Three times a day (TID) | ORAL | Status: DC
  Filled 2020-03-28 (×2): qty 0.5

## 2020-03-28 MED ORDER — DEXAMETHASONE SODIUM PHOSPHATE 10 MG/ML IJ SOLN
10.0000 mg | Freq: Once | INTRAMUSCULAR | Status: AC
  Administered 2020-03-29: 10 mg via INTRAVENOUS
  Filled 2020-03-28: qty 1

## 2020-03-28 MED ORDER — DEXAMETHASONE SODIUM PHOSPHATE 4 MG/ML IJ SOLN
INTRAMUSCULAR | Status: DC | PRN
Start: 2020-03-28 — End: 2020-03-28
  Administered 2020-03-28: 8 mg via PERINEURAL

## 2020-03-28 MED ORDER — MIDAZOLAM HCL 2 MG/2ML IJ SOLN
INTRAMUSCULAR | Status: AC
  Filled 2020-03-28: qty 2

## 2020-03-28 MED ORDER — BUPIVACAINE LIPOSOME 1.3 % IJ SUSP
INTRAMUSCULAR | Status: AC
  Filled 2020-03-28: qty 20

## 2020-03-28 MED ORDER — BUPIVACAINE-EPINEPHRINE (PF) 0.5% -1:200000 IJ SOLN
INTRAMUSCULAR | Status: AC
  Filled 2020-03-28: qty 30

## 2020-03-28 MED ORDER — MENTHOL 3 MG MT LOZG: 1.0000 | LOZENGE | OROMUCOSAL | Status: DC | PRN

## 2020-03-28 MED ORDER — ASPIRIN 81 MG PO CHEW
81.0000 mg | CHEWABLE_TABLET | Freq: Two times a day (BID) | ORAL | Status: DC
  Administered 2020-03-28 – 2020-03-29 (×2): 81 mg via ORAL
  Filled 2020-03-28 (×2): qty 1

## 2020-03-28 MED ORDER — TRANEXAMIC ACID-NACL 1000-0.7 MG/100ML-% IV SOLN
1000.0000 mg | Freq: Once | INTRAVENOUS | Status: AC
  Administered 2020-03-28: 1000 mg via INTRAVENOUS
  Filled 2020-03-28: qty 100

## 2020-03-28 MED ORDER — ONDANSETRON HCL 4 MG/2ML IJ SOLN
4.0000 mg | Freq: Once | INTRAMUSCULAR | Status: DC
  Administered 2020-03-28: 4 mg via INTRAVENOUS

## 2020-03-28 MED ORDER — ONDANSETRON HCL 4 MG/2ML IJ SOLN
INTRAMUSCULAR | Status: DC | PRN
  Administered 2020-03-28: 4 mg via INTRAVENOUS

## 2020-03-28 MED ORDER — CHLORHEXIDINE GLUCONATE CLOTH 2 % EX PADS
6.0000 | MEDICATED_PAD | Freq: Every day | CUTANEOUS | Status: DC
  Administered 2020-03-28 – 2020-03-29 (×2): 6 via TOPICAL

## 2020-03-28 MED ORDER — ONDANSETRON HCL 4 MG/2ML IJ SOLN
4.0000 mg | Freq: Four times a day (QID) | INTRAMUSCULAR | Status: DC | PRN
  Administered 2020-03-29: 4 mg via INTRAVENOUS
  Filled 2020-03-28: qty 2

## 2020-03-28 MED ORDER — CEFAZOLIN SODIUM-DEXTROSE 2-4 GM/100ML-% IV SOLN
2.0000 g | INTRAVENOUS | Status: AC
  Administered 2020-03-28: 2 g via INTRAVENOUS

## 2020-03-28 MED ORDER — PROPOFOL 500 MG/50ML IV EMUL
INTRAVENOUS | Status: DC | PRN
Start: 2020-03-28 — End: 2020-03-28
  Administered 2020-03-28: 100 ug/kg/min via INTRAVENOUS

## 2020-03-28 MED ORDER — ACETAMINOPHEN 500 MG PO TABS
500.0000 mg | ORAL_TABLET | Freq: Four times a day (QID) | ORAL | Status: DC
  Administered 2020-03-28 – 2020-03-29 (×4): 500 mg via ORAL
  Filled 2020-03-28 (×4): qty 1

## 2020-03-28 MED ORDER — POVIDONE-IODINE 10 % EX SWAB: 2.0000 "application " | Freq: Once | CUTANEOUS | Status: DC

## 2020-03-28 MED ORDER — METHOCARBAMOL 1000 MG/10ML IJ SOLN
500.0000 mg | Freq: Once | INTRAVENOUS | Status: AC
  Administered 2020-03-28: 500 mg via INTRAVENOUS
  Filled 2020-03-28: qty 5

## 2020-03-28 MED ORDER — SODIUM CHLORIDE 0.9 % IV SOLN: INTRAVENOUS | Status: AC

## 2020-03-28 MED ORDER — ACETAMINOPHEN 325 MG PO TABS
ORAL_TABLET | ORAL | Status: AC
  Filled 2020-03-28: qty 2

## 2020-03-28 MED ORDER — ATORVASTATIN CALCIUM 40 MG PO TABS
80.0000 mg | ORAL_TABLET | Freq: Every day | ORAL | Status: DC
  Administered 2020-03-29: 80 mg via ORAL
  Filled 2020-03-28: qty 2

## 2020-03-28 MED ORDER — GLYCOPYRROLATE PF 0.2 MG/ML IJ SOSY
PREFILLED_SYRINGE | INTRAMUSCULAR | Status: AC
  Filled 2020-03-28: qty 1

## 2020-03-28 MED ORDER — DEXAMETHASONE SODIUM PHOSPHATE 10 MG/ML IJ SOLN
INTRAMUSCULAR | Status: AC
  Filled 2020-03-28: qty 1

## 2020-03-28 MED ORDER — ORAL CARE MOUTH RINSE: 15.0000 mL | Freq: Once | OROMUCOSAL | Status: AC

## 2020-03-28 MED ORDER — BUPIVACAINE LIPOSOME 1.3 % IJ SUSP
INTRAMUSCULAR | Status: DC | PRN
  Administered 2020-03-28: 20 mL

## 2020-03-28 MED ORDER — ALUM & MAG HYDROXIDE-SIMETH 200-200-20 MG/5ML PO SUSP: 30.0000 mL | ORAL | Status: DC | PRN

## 2020-03-28 MED ORDER — TRANEXAMIC ACID-NACL 1000-0.7 MG/100ML-% IV SOLN
1000.0000 mg | INTRAVENOUS | Status: AC
  Administered 2020-03-28: 1000 mg via INTRAVENOUS

## 2020-03-28 MED ORDER — ACETAMINOPHEN 325 MG PO TABS
650.0000 mg | ORAL_TABLET | Freq: Once | ORAL | Status: DC
  Administered 2020-03-28: 650 mg via ORAL

## 2020-03-28 MED ORDER — LIDOCAINE HCL (PF) 1 % IJ SOLN
INTRAMUSCULAR | Status: AC
  Filled 2020-03-28: qty 30

## 2020-03-28 MED ORDER — DEXAMETHASONE SODIUM PHOSPHATE 4 MG/ML IJ SOLN
INTRAMUSCULAR | Status: AC
  Filled 2020-03-28: qty 2

## 2020-03-28 MED ORDER — METHOCARBAMOL 1000 MG/10ML IJ SOLN
500.0000 mg | Freq: Four times a day (QID) | INTRAVENOUS | Status: DC | PRN
  Filled 2020-03-28: qty 5

## 2020-03-28 MED ORDER — LIDOCAINE 2% (20 MG/ML) 5 ML SYRINGE
INTRAMUSCULAR | Status: AC
  Filled 2020-03-28: qty 5

## 2020-03-28 MED ORDER — PROMETHAZINE HCL 25 MG/ML IJ SOLN
6.2500 mg | INTRAMUSCULAR | Status: DC | PRN
  Administered 2020-03-28: 12.5 mg via INTRAVENOUS
  Filled 2020-03-28: qty 1

## 2020-03-28 MED ORDER — CEFAZOLIN SODIUM-DEXTROSE 2-4 GM/100ML-% IV SOLN
2.0000 g | Freq: Four times a day (QID) | INTRAVENOUS | Status: AC
  Administered 2020-03-28 (×2): 2 g via INTRAVENOUS
  Filled 2020-03-28 (×2): qty 100

## 2020-03-28 MED ORDER — PHENOL 1.4 % MT LIQD: 1.0000 | OROMUCOSAL | Status: DC | PRN

## 2020-03-28 MED ORDER — PREGABALIN 50 MG PO CAPS
50.0000 mg | ORAL_CAPSULE | Freq: Once | ORAL | Status: DC
  Administered 2020-03-28: 50 mg via ORAL

## 2020-03-28 MED ORDER — OXYCODONE HCL 5 MG PO TABS
5.0000 mg | ORAL_TABLET | ORAL | Status: DC | PRN
  Administered 2020-03-28 – 2020-03-29 (×4): 5 mg via ORAL
  Filled 2020-03-28 (×4): qty 1

## 2020-03-28 MED ORDER — OXYCODONE HCL 5 MG PO TABS
5.0000 mg | ORAL_TABLET | Freq: Once | ORAL | Status: DC
  Administered 2020-03-28: 5 mg via ORAL

## 2020-03-28 MED ORDER — OXYCODONE HCL 5 MG PO TABS
ORAL_TABLET | ORAL | Status: AC
  Filled 2020-03-28: qty 1

## 2020-03-28 MED ORDER — CELECOXIB 400 MG PO CAPS
400.0000 mg | ORAL_CAPSULE | Freq: Once | ORAL | Status: DC
  Administered 2020-03-28: 400 mg via ORAL

## 2020-03-28 MED ORDER — DOCUSATE SODIUM 100 MG PO CAPS
100.0000 mg | ORAL_CAPSULE | Freq: Two times a day (BID) | ORAL | Status: DC
  Administered 2020-03-28 – 2020-03-29 (×2): 100 mg via ORAL
  Filled 2020-03-28 (×2): qty 1

## 2020-03-28 MED ORDER — DIPHENHYDRAMINE HCL 12.5 MG/5ML PO ELIX: 12.5000 mg | ORAL_SOLUTION | ORAL | Status: DC | PRN

## 2020-03-28 MED ORDER — LIDOCAINE HCL (PF) 1 % IJ SOLN
INTRAMUSCULAR | Status: DC | PRN
  Administered 2020-03-28: 3 mL

## 2020-03-28 MED ORDER — SODIUM CHLORIDE FLUSH 0.9 % IV SOLN
INTRAVENOUS | Status: AC
  Filled 2020-03-28: qty 20

## 2020-03-28 MED ORDER — METOCLOPRAMIDE HCL 5 MG/ML IJ SOLN: 5.0000 mg | Freq: Three times a day (TID) | INTRAMUSCULAR | Status: DC | PRN

## 2020-03-28 MED ORDER — METOCLOPRAMIDE HCL 10 MG PO TABS: 5.0000 mg | ORAL_TABLET | Freq: Three times a day (TID) | ORAL | Status: DC | PRN

## 2020-03-28 MED ORDER — CEFAZOLIN SODIUM-DEXTROSE 2-4 GM/100ML-% IV SOLN
INTRAVENOUS | Status: AC
  Filled 2020-03-28: qty 100

## 2020-03-28 MED ORDER — TRAMADOL HCL 50 MG PO TABS
50.0000 mg | ORAL_TABLET | Freq: Four times a day (QID) | ORAL | Status: DC
  Administered 2020-03-28 – 2020-03-29 (×2): 50 mg via ORAL
  Filled 2020-03-28 (×2): qty 1

## 2020-03-28 MED ORDER — PROPOFOL 10 MG/ML IV BOLUS
INTRAVENOUS | Status: AC
  Filled 2020-03-28: qty 40

## 2020-03-28 MED ORDER — LACTATED RINGERS IV SOLN
Freq: Once | INTRAVENOUS | Status: AC
  Administered 2020-03-28: 1000 mL via INTRAVENOUS

## 2020-03-28 MED ORDER — ROPIVACAINE HCL 5 MG/ML IJ SOLN
INTRAMUSCULAR | Status: DC | PRN
Start: 2020-03-28 — End: 2020-03-28
  Administered 2020-03-28: 28 mL via PERINEURAL

## 2020-03-28 MED ORDER — FENTANYL CITRATE (PF) 100 MCG/2ML IJ SOLN
INTRAMUSCULAR | Status: AC
  Filled 2020-03-28: qty 2

## 2020-03-28 MED ORDER — HYDROMORPHONE HCL 1 MG/ML IJ SOLN
0.2500 mg | INTRAMUSCULAR | Status: DC | PRN
  Administered 2020-03-28 (×4): 0.5 mg via INTRAVENOUS
  Filled 2020-03-28 (×4): qty 0.5

## 2020-03-28 MED ORDER — PREGABALIN 50 MG PO CAPS
ORAL_CAPSULE | ORAL | Status: AC
  Filled 2020-03-28: qty 1

## 2020-03-28 MED ORDER — TRANEXAMIC ACID-NACL 1000-0.7 MG/100ML-% IV SOLN
INTRAVENOUS | Status: AC
  Filled 2020-03-28: qty 100

## 2020-03-28 MED ORDER — NICOTINE 14 MG/24HR TD PT24
14.0000 mg | MEDICATED_PATCH | Freq: Every day | TRANSDERMAL | Status: DC
  Administered 2020-03-28 – 2020-03-29 (×2): 14 mg via TRANSDERMAL
  Filled 2020-03-28 (×3): qty 1

## 2020-03-28 MED ORDER — GLYCOPYRROLATE 0.2 MG/ML IJ SOLN
INTRAMUSCULAR | Status: DC | PRN
  Administered 2020-03-28: .2 mg via INTRAVENOUS

## 2020-03-28 MED ORDER — BUPIVACAINE IN DEXTROSE 0.75-8.25 % IT SOLN
INTRATHECAL | Status: DC | PRN
  Administered 2020-03-28: 15 mg via INTRATHECAL

## 2020-03-28 MED ORDER — MEPERIDINE HCL 50 MG/ML IJ SOLN: 6.2500 mg | INTRAMUSCULAR | Status: DC | PRN

## 2020-03-28 MED ORDER — LACTATED RINGERS IV SOLN
INTRAVENOUS | Status: DC | PRN
Start: 2020-03-28 — End: 2020-03-28

## 2020-03-28 MED ORDER — PROPOFOL 10 MG/ML IV BOLUS
INTRAVENOUS | Status: AC
  Filled 2020-03-28: qty 20

## 2020-03-28 MED ORDER — ROPIVACAINE HCL 5 MG/ML IJ SOLN
INTRAMUSCULAR | Status: AC
  Filled 2020-03-28: qty 30

## 2020-03-28 MED ORDER — CELECOXIB 400 MG PO CAPS
ORAL_CAPSULE | ORAL | Status: AC
  Filled 2020-03-28: qty 1

## 2020-03-28 MED ORDER — METHOCARBAMOL 500 MG PO TABS: 500.0000 mg | ORAL_TABLET | Freq: Four times a day (QID) | ORAL | Status: DC | PRN

## 2020-03-28 MED ORDER — GABAPENTIN 300 MG PO CAPS
300.0000 mg | ORAL_CAPSULE | Freq: Three times a day (TID) | ORAL | Status: DC
  Administered 2020-03-28 – 2020-03-29 (×2): 300 mg via ORAL
  Filled 2020-03-28 (×2): qty 1

## 2020-03-28 MED ORDER — MIDAZOLAM HCL 5 MG/5ML IJ SOLN
INTRAMUSCULAR | Status: DC | PRN
  Administered 2020-03-28 (×2): 1 mg via INTRAVENOUS

## 2020-03-28 MED ORDER — FENTANYL CITRATE (PF) 100 MCG/2ML IJ SOLN
INTRAMUSCULAR | Status: DC | PRN
  Administered 2020-03-28 (×2): 50 ug via INTRAVENOUS
  Administered 2020-03-28: 100 ug via INTRAVENOUS

## 2020-03-28 MED ORDER — SODIUM CHLORIDE 0.9 % IR SOLN
Status: DC | PRN
  Administered 2020-03-28: 3000 mL

## 2020-03-28 MED ORDER — DEXAMETHASONE SODIUM PHOSPHATE 10 MG/ML IJ SOLN
INTRAMUSCULAR | Status: DC | PRN
  Administered 2020-03-28: 10 mg via INTRAVENOUS

## 2020-03-28 SURGICAL SUPPLY — 75 items
ATTUNE MED DOME PAT 38 KNEE (Knees) ×1 IMPLANT
ATTUNE MED DOME PAT 38MM KNEE (Knees) ×1 IMPLANT
ATTUNE PS FEM RT SZ 6 CEM KNEE (Femur) ×2 IMPLANT
BANDAGE ACE 4X5 VEL STRL LF (GAUZE/BANDAGES/DRESSINGS) ×4 IMPLANT
BANDAGE ELASTIC 6 VELCRO ST LF (GAUZE/BANDAGES/DRESSINGS) ×2 IMPLANT
BANDAGE ESMARK 6X9 LF (GAUZE/BANDAGES/DRESSINGS) ×1 IMPLANT
BASEPLATE TIB CMT FB PCKT SZ 8 (Knees) ×2 IMPLANT
BLADE HEX COATED 2.75 (ELECTRODE) ×3 IMPLANT
BLADE SAGITTAL 25.0X1.27X90 (BLADE) ×2 IMPLANT
BLADE SAGITTAL 25.0X1.27X90MM (BLADE) ×1
BLADE SAW SGTL 11.0X1.19X90.0M (BLADE) ×3 IMPLANT
BNDG CMPR 9X6 STRL LF SNTH (GAUZE/BANDAGES/DRESSINGS) ×1
BNDG CMPR STD VLCR NS LF 5.8X4 (GAUZE/BANDAGES/DRESSINGS) ×2
BNDG CMPR STD VLCR NS LF 5.8X6 (GAUZE/BANDAGES/DRESSINGS) ×1
BNDG ELASTIC 4X5.8 VLCR NS LF (GAUZE/BANDAGES/DRESSINGS) ×6 IMPLANT
BNDG ELASTIC 6X5.8 VLCR NS LF (GAUZE/BANDAGES/DRESSINGS) ×3 IMPLANT
BNDG ESMARK 6X9 LF (GAUZE/BANDAGES/DRESSINGS) ×3
BOWL SMART MIX CTS (DISPOSABLE) ×4 IMPLANT
BSPLAT TIB 8 CMNT FXBRNG STRL (Knees) ×1 IMPLANT
CEMENT HV SMART SET (Cement) ×8 IMPLANT
CLOTH BEACON ORANGE TIMEOUT ST (SAFETY) ×3 IMPLANT
COOLER ICEMAN CLASSIC (MISCELLANEOUS) ×3 IMPLANT
COVER LIGHT HANDLE STERIS (MISCELLANEOUS) ×6 IMPLANT
COVER WAND RF STERILE (DRAPES) ×3 IMPLANT
CUFF TOURN SGL QUICK 34 (TOURNIQUET CUFF) ×3
CUFF TRNQT CYL 34X4.125X (TOURNIQUET CUFF) ×1 IMPLANT
DECANTER SPIKE VIAL GLASS SM (MISCELLANEOUS) IMPLANT
DRAPE BACK TABLE (DRAPES) ×3 IMPLANT
DRAPE EXTREMITY T 121X128X90 (DISPOSABLE) ×3 IMPLANT
DRSG MEPILEX BORDER 4X12 (GAUZE/BANDAGES/DRESSINGS) ×3 IMPLANT
DURAPREP 26ML APPLICATOR (WOUND CARE) ×6 IMPLANT
ELECT REM PT RETURN 9FT ADLT (ELECTROSURGICAL) ×3
ELECTRODE REM PT RTRN 9FT ADLT (ELECTROSURGICAL) ×1 IMPLANT
GLOVE BIOGEL PI IND STRL 7.0 (GLOVE) ×2 IMPLANT
GLOVE BIOGEL PI INDICATOR 7.0 (GLOVE) ×4
GLOVE SKINSENSE NS SZ8.0 LF (GLOVE) ×4
GLOVE SKINSENSE STRL SZ8.0 LF (GLOVE) ×2 IMPLANT
GLOVE SS N UNI LF 8.5 STRL (GLOVE) ×3 IMPLANT
GOWN STRL REUS W/TWL LRG LVL3 (GOWN DISPOSABLE) ×9 IMPLANT
GOWN STRL REUS W/TWL XL LVL3 (GOWN DISPOSABLE) ×3 IMPLANT
HANDPIECE INTERPULSE COAX TIP (DISPOSABLE) ×3
HOOD W/PEELAWAY (MISCELLANEOUS) ×12 IMPLANT
INSERT TIB PS FB ATTUNE SZ6X5 (Knees) ×2 IMPLANT
INST SET MAJOR BONE (KITS) ×3 IMPLANT
IV NS IRRIG 3000ML ARTHROMATIC (IV SOLUTION) ×3 IMPLANT
KIT BLADEGUARD II DBL (SET/KITS/TRAYS/PACK) ×3 IMPLANT
KIT TURNOVER KIT A (KITS) ×3 IMPLANT
MANIFOLD NEPTUNE II (INSTRUMENTS) ×3 IMPLANT
MARKER SKIN DUAL TIP RULER LAB (MISCELLANEOUS) ×3 IMPLANT
NDL HYPO 18GX1.5 BLUNT FILL (NEEDLE) IMPLANT
NDL HYPO 21X1.5 SAFETY (NEEDLE) IMPLANT
NEEDLE HYPO 18GX1.5 BLUNT FILL (NEEDLE) IMPLANT
NEEDLE HYPO 21X1.5 SAFETY (NEEDLE) IMPLANT
NS IRRIG 1000ML POUR BTL (IV SOLUTION) ×3 IMPLANT
PACK TOTAL JOINT (CUSTOM PROCEDURE TRAY) ×3 IMPLANT
PAD ARMBOARD 7.5X6 YLW CONV (MISCELLANEOUS) ×3 IMPLANT
PAD COLD SHLDR WRAP-ON (PAD) ×4 IMPLANT
PENCIL SMOKE EVACUATOR (MISCELLANEOUS) ×3 IMPLANT
PILLOW KNEE EXTENSION 0 DEG (MISCELLANEOUS) ×3 IMPLANT
PIN/DRILL PACK ORTHO 1/8X3.0 (PIN) ×5 IMPLANT
SAW OSC TIP CART 19.5X105X1.3 (SAW) ×3 IMPLANT
SET BASIN LINEN APH (SET/KITS/TRAYS/PACK) ×3 IMPLANT
SET HNDPC FAN SPRY TIP SCT (DISPOSABLE) ×1 IMPLANT
STAPLER VISISTAT 35W (STAPLE) ×3 IMPLANT
SUT BRALON NAB BRD #1 30IN (SUTURE) ×3 IMPLANT
SUT MNCRL 0 VIOLET CTX 36 (SUTURE) ×1 IMPLANT
SUT MON AB 0 CT1 (SUTURE) ×3 IMPLANT
SUT MONOCRYL 0 CTX 36 (SUTURE) ×6
SYR 20ML LL LF (SYRINGE) ×2 IMPLANT
SYR BULB IRRIG 60ML STRL (SYRINGE) ×3 IMPLANT
TOWEL OR 17X26 4PK STRL BLUE (TOWEL DISPOSABLE) ×3 IMPLANT
TOWER CARTRIDGE SMART MIX (DISPOSABLE) ×3 IMPLANT
TRAY FOLEY MTR SLVR 16FR STAT (SET/KITS/TRAYS/PACK) ×3 IMPLANT
WATER STERILE IRR 1000ML POUR (IV SOLUTION) ×6 IMPLANT
YANKAUER SUCT 12FT TUBE ARGYLE (SUCTIONS) ×3 IMPLANT

## 2020-03-28 NOTE — Anesthesia Preprocedure Evaluation (Signed)
Anesthesia Evaluation  Patient identified by MRN, date of birth, ID band Patient awake    Reviewed: Allergy & Precautions, NPO status , Patient's Chart, lab work & pertinent test results  History of Anesthesia Complications Negative for: history of anesthetic complications  Airway Mallampati: II  TM Distance: >3 FB Neck ROM: Full    Dental  (+) Teeth Intact, Dental Advisory Given   Pulmonary former smoker,    Pulmonary exam normal breath sounds clear to auscultation       Cardiovascular Exercise Tolerance: Good hypertension, Pt. on medications Normal cardiovascular exam Rhythm:Regular Rate:Normal     Neuro/Psych negative neurological ROS  negative psych ROS   GI/Hepatic GERD  Medicated,  Endo/Other    Renal/GU   negative genitourinary   Musculoskeletal  (+) Arthritis , Osteoarthritis,    Abdominal   Peds negative pediatric ROS (+)  Hematology   Anesthesia Other Findings   Reproductive/Obstetrics                            Anesthesia Physical Anesthesia Plan  ASA: II  Anesthesia Plan: Spinal and General   Post-op Pain Management:    Induction:   PONV Risk Score and Plan: 3 and Ondansetron, Dexamethasone and Midazolam  Airway Management Planned: Nasal Cannula, Natural Airway and Simple Face Mask  Additional Equipment:   Intra-op Plan:   Post-operative Plan:   Informed Consent: I have reviewed the patients History and Physical, chart, labs and discussed the procedure including the risks, benefits and alternatives for the proposed anesthesia with the patient or authorized representative who has indicated his/her understanding and acceptance.     Dental advisory given  Plan Discussed with: CRNA and Surgeon  Anesthesia Plan Comments: (Possible GA with airway was discussed.)        Anesthesia Quick Evaluation

## 2020-03-28 NOTE — Transfer of Care (Signed)
Immediate Anesthesia Transfer of Care Note  Patient: Victor [name]  Procedure(s) Performed: TOTAL KNEE ARTHROPLASTY (Right Knee)  Patient Location: PACU  Anesthesia Type:Spinal  Level of Consciousness: awake, alert , oriented and patient cooperative  Airway & Oxygen Therapy: Patient Spontanous Breathing and Patient connected to face mask oxygen  Post-op Assessment: Report given to RN and Post -op Vital signs reviewed and stable  Post vital signs: Reviewed and stable  Last Vitals:  Vitals Value Taken Time  BP 150/131 03/28/20 1015  Temp    Pulse 79 03/28/20 1016  Resp 11 03/28/20 1016  SpO2 90 % 03/28/20 1016  Vitals shown include unvalidated device data.  Last Pain:  Vitals:   03/28/20 0642  TempSrc: Oral  PainSc: 6       Patients Stated Pain Goal: 8 (57/90/38 3338)  Complications: No complications documented.

## 2020-03-28 NOTE — Interval H&P Note (Signed)
History and Physical Interval Note:  03/28/2020 7:19 AM  Victor [name]  has presented today for surgery, with the diagnosis of osteoarthritis right knee.  The various methods of treatment have been discussed with the patient and family. After consideration of risks, benefits and other options for treatment, the patient has consented to  Procedure(s): TOTAL KNEE ARTHROPLASTY (Right) as a surgical intervention.  The patient's history has been reviewed, patient examined, no change in status, stable for surgery.  I have reviewed the patient's chart and labs.  Questions were answered to the patient's satisfaction.    Wbc count repeated is now normal   Skin lesion IS NOT INFECTION and wil be followed by dermatology  Victor [name]

## 2020-03-28 NOTE — Plan of Care (Signed)
  Problem: Acute Rehab PT Goals(only PT should resolve) Goal: Pt Will Go Supine/Side To Sit Outcome: Progressing Flowsheets (Taken 03/28/2020 1451) Pt will go Supine/Side to Sit: with modified independence Goal: Patient Will Transfer Sit To/From Stand Outcome: Progressing Flowsheets (Taken 03/28/2020 1451) Patient will transfer sit to/from stand: with modified independence Goal: Pt Will Transfer Bed To Chair/Chair To Bed Outcome: Progressing Flowsheets (Taken 03/28/2020 1451) Pt will Transfer Bed to Chair/Chair to Bed: with modified independence Goal: Pt Will Ambulate Outcome: Progressing Flowsheets (Taken 03/28/2020 1451) Pt will Ambulate:  > 125 feet  with modified independence  with rolling walker Goal: Pt Will Go Up/Down Stairs 03/28/2020 1452 by Lonell Grandchild, PT Flowsheets (Taken 03/28/2020 1452) Pt will Go Up / Down Stairs:  6-9 stairs  with supervision  with min guard assist  with cane  with rail(s) 03/28/2020 1451 by Lonell Grandchild, PT Outcome: Progressing   2:53 PM, 03/28/20 Lonell Grandchild, MPT Physical Therapist with [facility] [phone] office [phone] mobile phone

## 2020-03-28 NOTE — Anesthesia Postprocedure Evaluation (Signed)
Anesthesia Post Note  Patient: Kelan Pritt Kortz  Procedure(s) Performed: TOTAL KNEE ARTHROPLASTY (Right Knee)  Patient location during evaluation: PACU Anesthesia Type: Spinal Level of consciousness: awake, oriented, awake and alert and patient cooperative Pain management: satisfactory to patient Vital Signs Assessment: post-procedure vital signs reviewed and stable Respiratory status: spontaneous breathing, respiratory function stable, nonlabored ventilation and patient connected to nasal cannula oxygen Cardiovascular status: stable Postop Assessment: no apparent nausea or vomiting Anesthetic complications: no   No complications documented.   Last Vitals:  Vitals:   03/28/20 0642 03/28/20 0720  BP: 128/90 (!) 158/98  Pulse: 79 78  Resp: 18 20  Temp: 36.7 C   SpO2: 95% 98%    Last Pain:  Vitals:   03/28/20 0642  TempSrc: Oral  PainSc: 6                  Lunell Robart

## 2020-03-28 NOTE — Evaluation (Signed)
Physical Therapy Evaluation Patient Details Name: Victor [name] MRN: [id number] DOB: January 29, 1976 Today's Date: 03/28/2020  RIGHT KNEE ROM:  3 - 88 degrees AMBULATION DISTANCE:  80 feet using RW with Supervision    History of Present Illness  Victor [name] is a 44 y/o male, s/p Right TKA, 03/28/20, with the diagnosis of osteoarthritis right knee.  Clinical Impression  Patient instructed in HEP with fair/good carryover, demonstrates slightly labored slow movement for sitting up at bedside and during ambulation without loss of balance, limited for gait training mostly due to c/o fatigue and put back to bed with bone foam put in place RLE.  Patient will benefit from continued physical therapy in hospital and recommended venue below to increase strength, balance, endurance for safe ADLs and gait.    Follow Up Recommendations Home health PT;Supervision for mobility/OOB;Supervision - Intermittent    Equipment Recommendations  None recommended by PT    Recommendations for Other Services       Precautions / Restrictions Precautions Precautions: Fall Restrictions Weight Bearing Restrictions: Yes RLE Weight Bearing: Weight bearing as tolerated      Mobility  Bed Mobility Overal bed mobility: Needs Assistance Bed Mobility: Supine to Sit;Sit to Supine     Supine to sit: Supervision;Modified independent (Device/Increase time) Sit to supine: Min guard;Min assist   General bed mobility comments: required assistance to move RLE when getting back in bed  Transfers Overall transfer level: Needs assistance Equipment used: Rolling walker (2 wheeled) Transfers: Sit to/from [facility] Sit to Stand: Supervision Stand pivot transfers: Supervision       General transfer comment: increased time, slightly labored movement  Ambulation/Gait Ambulation/Gait assistance: Supervision Gait Distance (Feet): 80 Feet Assistive device: Rolling walker (2 wheeled) Gait  Pattern/deviations: Decreased step length - right;Decreased stance time - right;Decreased stride length;Antalgic Gait velocity: decreased   General Gait Details: slightly labored slow cadence with fair/good return for right heel to toe stepping without loss of balance, limited secondary to fatigue  Stairs            Wheelchair Mobility    Modified Rankin (Stroke Patients Only)       Balance Overall balance assessment: Needs assistance Sitting-balance support: Feet supported;No upper extremity supported Sitting balance-Leahy Scale: Good Sitting balance - Comments: seated at EOB   Standing balance support: During functional activity;Bilateral upper extremity supported Standing balance-Leahy Scale: Fair Standing balance comment: fair/good using RW                             Pertinent Vitals/Pain Pain Assessment: 0-10 Pain Score: 8  Pain Location: right knee Pain Descriptors / Indicators: Sore;Grimacing Pain Intervention(s): Limited activity within patient's tolerance;Monitored during session;Repositioned;Ice applied    Home Living Family/patient expects to be discharged to:: Private residence Living Arrangements: Spouse/significant other Available Help at Discharge: Family;Available 24 hours/day Type of Home: House Home Access: Stairs to enter Entrance Stairs-Rails: Right;Left;Can reach both Entrance Stairs-Number of Steps: 7 Home Layout: Two level;Able to live on main level with bedroom/bathroom Home Equipment: Gilford Rile - 2 wheels;Cane - single point      Prior Function Level of Independence: Independent with assistive device(s)         Comments: Hydrographic surveyor, drives     Hand Dominance   Dominant Hand: Right    Extremity/Trunk Assessment   Upper Extremity Assessment Upper Extremity Assessment: Overall WFL for tasks assessed    Lower Extremity Assessment Lower Extremity Assessment: Generalized  weakness;RLE deficits/detail RLE  Deficits / Details: grossly -4/5 RLE: Unable to fully assess due to pain RLE Sensation: WNL RLE Coordination: WNL    Cervical / Trunk Assessment Cervical / Trunk Assessment: Normal  Communication   Communication: No difficulties  Cognition Arousal/Alertness: Awake/alert Behavior During Therapy: WFL for tasks assessed/performed Overall Cognitive Status: Within Functional Limits for tasks assessed                                        General Comments      Exercises Total Joint Exercises Ankle Circles/Pumps: Supine;5 reps;Strengthening;Right;AROM Quad Sets: Supine;5 reps;Right;Strengthening;AROM Short Arc Quad: Supine;5 reps;Right;Strengthening;AAROM Heel Slides: Supine;5 reps;Right;Strengthening;AROM   Assessment/Plan    PT Assessment Patient needs continued PT services  PT Problem List Decreased strength;Decreased range of motion;Decreased activity tolerance;Decreased balance;Decreased mobility       PT Treatment Interventions Gait training;Balance training;DME instruction;Stair training;Functional mobility training;Therapeutic activities;Therapeutic exercise;Patient/family education    PT Goals (Current goals can be found in the Care Plan section)  Acute Rehab PT Goals Patient Stated Goal: return home with family to assist PT Goal Formulation: With patient Time For Goal Achievement: 03/30/20 Potential to Achieve Goals: Good    Frequency BID   Barriers to discharge        Co-evaluation               AM-PAC PT "6 Clicks" Mobility  Outcome Measure Help needed turning from your back to your side while in a flat bed without using bedrails?: None Help needed moving from lying on your back to sitting on the side of a flat bed without using bedrails?: A Little Help needed moving to and from a bed to a chair (including a wheelchair)?: A Little Help needed standing up from a chair using your arms (e.g., wheelchair or bedside chair)?: A Little Help  needed to walk in hospital room?: A Little Help needed climbing 3-5 steps with a railing? : A Lot 6 Click Score: 18    End of Session   Activity Tolerance: Patient tolerated treatment well;Patient limited by fatigue Patient left: in bed;with call bell/phone within reach Nurse Communication: Mobility status PT Visit Diagnosis: Unsteadiness on feet (R26.81);Other abnormalities of gait and mobility (R26.89);Muscle weakness (generalized) (M62.81)    Time: 9357-0177 PT Time Calculation (min) (ACUTE ONLY): 32 min   Charges:   PT Evaluation $PT Eval Moderate Complexity: 1 Mod PT Treatments $Therapeutic Activity: 23-37 mins        2:51 PM, 03/28/20 Lonell Grandchild, MPT Physical Therapist with [facility] [phone] office [phone] mobile phone

## 2020-03-28 NOTE — Op Note (Signed)
03/28/2020  10:10 AM  PATIENT:  Victor [name]  43 y.o. male  PRE-OPERATIVE DIAGNOSIS:  osteoarthritis right knee  POST-OPERATIVE DIAGNOSIS:  osteoarthritis right knee  PROCEDURE:  Procedure(s): TOTAL KNEE ARTHROPLASTY (Right)   Implants: Attune size 8 tibia size 6 femur size 38 patella size 5 insert  Operative findings grade 4 lesion medial patella 5 mm, grade 4 diffuse chondral degeneration medial femoral condyle and lateral femoral condyle  Assisted by [name] and [name]  Spinal anesthesia with preoperative saphenous nerve block  Bone cuts 11 femur, 3 medial tibia, patella cut from 26-13   SURGEON:  Surgeon(s) and Role:    [name], MD - Primary  EBL:  20 mL   BLOOD ADMINISTERED:none  DRAINS: none   LOCAL MEDICATIONS USED:  MARCAINE     SPECIMEN:  No Specimen  DISPOSITION OF SPECIMEN:  N/A  COUNTS:  YES  TOURNIQUET: 275 mmHg see nurses notes  DICTATION: .Dragon Dictation  PLAN OF CARE: Admit to inpatient   PATIENT DISPOSITION:  PACU - hemodynamically stable.   Delay start of Pharmacological VTE agent (>24hrs) due to surgical blood loss or risk of bleeding: yes  This is how it was done  The patient was seen in preop his right leg was confirmed the surgical site marked chart review and x-ray review was completed implants checked for availability  Patient taken to surgery for spinal anesthesia  Right leg prepped and draped sterilely  Timeout completed  Limb was held in elevation for 1 minute and then exsanguinated with a 6 inch Esmarch tourniquet elevated to 275 mmHg    The knee was placed in flexion and a midline incision was made subcutaneous tissue was divided extensor mechanism was identified and a medial arthrotomy was performed.  The patient had a significant amount of scarring in the retropatellar tendon which was excised.  Anterior horn of medial lateral meniscus was excised.  Previous ACL graft was intact good tension was  on it and it was excised.  Tibia was brought forward posterior meniscus medial lateral were excised posterior cruciate ligament excised  Patellofemoral ligament released  3/8 inch drill bit used to enter the femoral canal the canal was suctioned and irrigated.  11 mm right 5degree setting on the guide was used and the intramedullary rod was placed in the canal and 11 mm of bone was resected  The tibia was then resected using a neutral alignment external guide with 3 degree posterior slope 3 mm reference from the lower medial side iliac crest and tibial tubercle reference sites.  After pinning proximal tibia was resected.  Extension gap was checked with a 5 mm block and the knee came to full extension  We then measured the femur to a size 6.  The tibia measured a size 8.  We made the 4 cuts on the femur and then did a gap check with a 5 mm block.  Extension gap flexion gap were equal knee was in full extension  Notch cut was made with a size 6 cutting guide  Trial reduction was performed patella tracked normally.  Femur was low holes were drilled.  Tibia was punched and drill holes were placed on the medial side due to sclerotic bone.  Patella was sized to a 26 cut down to a 13.  Knee was irrigated bone was dried cement was placed on the implants  At this point we had difficulty subluxating the tibia forward.  The posterior condyle was trapped on  the proximal tibia.  Posterior medial capsular tissue was released along with further medial release to open up the medial side.  With a bone hook and rotation the tibia was subluxated forward and the polyethylene was secured to the proximal tibia.  A second batch of cement was used cement the patella  Passive flexion test 125 degrees flexion.  Knee came to full extension.  Knee was irrigated and closed in layered fashion with #1 [name] in 2 sutures for the extensor mechanism and then an interrupted 0 Monocryl in a continuous 0 Monocryl for the  subcu tissue along with staples  Prior to cementing we injected 20 cc of exparel in the soft tissues including collateral ligaments patellar tendon medial soft tissue sleeve and quadriceps tendon  Tourniquet was released and sterile dressing was applied

## 2020-03-28 NOTE — Anesthesia Procedure Notes (Signed)
Spinal  Patient location during procedure: OR Start time: 03/28/2020 7:38 AM End time: 03/28/2020 7:44 AM Staffing Performed: anesthesiologist  Anesthesiologist: Denese Killings, MD Preanesthetic Checklist Completed: patient identified, IV checked, site marked, risks and benefits discussed, surgical consent, monitors and equipment checked, pre-op evaluation and timeout performed Spinal Block Patient position: sitting Prep: Betadine Patient monitoring: heart rate, cardiac monitor, continuous pulse ox and blood pressure Approach: midline Location: L3-4 Injection technique: single-shot Needle Needle type: Sprotte and Spinocan  Needle gauge: 24 G Needle length: 10 cm Needle insertion depth: 6 cm Assessment Sensory level: T8

## 2020-03-28 NOTE — Anesthesia Procedure Notes (Addendum)
Anesthesia Regional Block: Adductor canal block   Pre-Anesthetic Checklist: ,, timeout performed, Correct Patient, Correct Site, Correct Laterality, Correct Procedure, Correct Position, site marked, Risks and benefits discussed,  Surgical consent,  Pre-op evaluation,  At surgeon's request and post-op pain management  Laterality: Right  Prep: Betadine, chloraprep       Needles:  Injection technique: Single-shot  Needle Type: Stimulator Needle - 80     Needle Length: 10cm  Needle Gauge: 22   Needle insertion depth: 6 cm   Additional Needles:   Procedures:,,,, ultrasound used (permanent image in chart),,,,  Narrative:  Start time: 03/28/2020 7:14 AM End time: 03/28/2020 7:24 AM  Performed by: Personally  Anesthesiologist: Denese Killings, MD  Additional Notes: BP cuff, EKG monitors applied. Sedation begun. After nerve location anesthetic injected incrementally, slowly , and after neg aspirations. Tolerated well.

## 2020-03-28 NOTE — Brief Op Note (Signed)
03/28/2020  10:10 AM  PATIENT:  Victor [name]  43 y.o. male  PRE-OPERATIVE DIAGNOSIS:  osteoarthritis right knee  POST-OPERATIVE DIAGNOSIS:  osteoarthritis right knee  PROCEDURE:  Procedure(s): TOTAL KNEE ARTHROPLASTY (Right)   Implants: Attune size 8 tibia size 6 femur size 38 patella size 5 insert  Operative findings grade 4 lesion medial patella 5 mm, grade 4 diffuse chondral degeneration medial femoral condyle and lateral femoral condyle  Assisted by [name] and [name]  Spinal anesthesia with preoperative saphenous nerve block  Bone cuts 11 femur, 3 medial tibia, patella cut from 26-13   SURGEON:  Surgeon(s) and Role:    [name], MD - Primary  EBL:  20 mL   BLOOD ADMINISTERED:none  DRAINS: none   LOCAL MEDICATIONS USED:  MARCAINE     SPECIMEN:  No Specimen  DISPOSITION OF SPECIMEN:  N/A  COUNTS:  YES  TOURNIQUET: 275 mmHg see nurses notes  DICTATION: .Dragon Dictation  PLAN OF CARE: Admit to inpatient   PATIENT DISPOSITION:  PACU - hemodynamically stable.   Delay start of Pharmacological VTE agent (>24hrs) due to surgical blood loss or risk of bleeding: yes  This is how it was done  The patient was seen in preop his right leg was confirmed the surgical site marked chart review and x-ray review was completed implants checked for availability  Patient taken to surgery for spinal anesthesia  Right leg prepped and draped sterilely  Timeout completed  Limb was held in elevation for 1 minute and then exsanguinated with a 6 inch Esmarch tourniquet elevated to 275 mmHg    The knee was placed in flexion and a midline incision was made subcutaneous tissue was divided extensor mechanism was identified and a medial arthrotomy was performed.  The patient had a significant amount of scarring in the retropatellar tendon which was excised.  Anterior horn of medial lateral meniscus was excised.  Previous ACL graft was intact good tension was  on it and it was excised.  Tibia was brought forward posterior meniscus medial lateral were excised posterior cruciate ligament excised  Patellofemoral ligament released  3/8 inch drill bit used to enter the femoral canal the canal was suctioned and irrigated.  11 mm right 5degree setting on the guide was used and the intramedullary rod was placed in the canal and 11 mm of bone was resected  The tibia was then resected using a neutral alignment external guide with 3 degree posterior slope 3 mm reference from the lower medial side iliac crest and tibial tubercle reference sites.  After pinning proximal tibia was resected.  Extension gap was checked with a 5 mm block and the knee came to full extension  We then measured the femur to a size 6.  The tibia measured a size 8.  We made the 4 cuts on the femur and then did a gap check with a 5 mm block.  Extension gap flexion gap were equal knee was in full extension  Notch cut was made with a size 6 cutting guide  Trial reduction was performed patella tracked normally.  Femur was low holes were drilled.  Tibia was punched and drill holes were placed on the medial side due to sclerotic bone.  Patella was sized to a 26 cut down to a 13.  Knee was irrigated bone was dried cement was placed on the implants  At this point we had difficulty subluxating the tibia forward.  The posterior condyle was trapped on  the proximal tibia.  Posterior medial capsular tissue was released along with further medial release to open up the medial side.  With a bone hook and rotation the tibia was subluxated forward and the polyethylene was secured to the proximal tibia.  A second batch of cement was used cement the patella  Passive flexion test 125 degrees flexion.  Knee came to full extension.  Knee was irrigated and closed in layered fashion with #1 [name] in 2 sutures for the extensor mechanism and then an interrupted 0 Monocryl in a continuous 0 Monocryl for the  subcu tissue along with staples  Prior to cementing we injected 20 cc of exparel in the soft tissues including collateral ligaments patellar tendon medial soft tissue sleeve and quadriceps tendon  Tourniquet was released and sterile dressing was applied

## 2020-03-29 ENCOUNTER — Encounter (HOSPITAL_COMMUNITY): Payer: Self-pay | Admitting: Orthopedic Surgery

## 2020-03-29 DIAGNOSIS — M1711 Unilateral primary osteoarthritis, right knee: Secondary | ICD-10-CM | POA: Diagnosis not present

## 2020-03-29 MED ORDER — TRAMADOL HCL 50 MG PO TABS: 50.0000 mg | ORAL_TABLET | Freq: Four times a day (QID) | ORAL | 0 refills | Status: DC

## 2020-03-29 MED ORDER — ASPIRIN 81 MG PO CHEW: 81.0000 mg | CHEWABLE_TABLET | Freq: Two times a day (BID) | ORAL | 0 refills | Status: DC

## 2020-03-29 MED ORDER — DOCUSATE SODIUM 100 MG PO CAPS: 100.0000 mg | ORAL_CAPSULE | Freq: Two times a day (BID) | ORAL | 0 refills | Status: DC

## 2020-03-29 MED ORDER — OXYCODONE HCL 5 MG PO TABS: 5.0000 mg | ORAL_TABLET | ORAL | 0 refills | Status: DC | PRN

## 2020-03-29 MED ORDER — METHOCARBAMOL 500 MG PO TABS: 500.0000 mg | ORAL_TABLET | Freq: Four times a day (QID) | ORAL | 1 refills | Status: DC | PRN

## 2020-03-29 MED ORDER — GABAPENTIN 300 MG PO CAPS: 300.0000 mg | ORAL_CAPSULE | Freq: Three times a day (TID) | ORAL | 2 refills | Status: DC

## 2020-03-29 MED ORDER — POLYETHYLENE GLYCOL 3350 17 G PO PACK: 17.0000 g | PACK | Freq: Every day | ORAL | 0 refills | Status: DC

## 2020-03-29 NOTE — Plan of Care (Signed)
  Problem: Education: Goal: Knowledge of General Education information will improve Description: Including pain rating scale, medication(s)/side effects and non-pharmacologic comfort measures Reactivated   Problem: Health Behavior/Discharge Planning: Goal: Ability to manage health-related needs will improve Reactivated   Problem: Clinical Measurements: Goal: Ability to maintain clinical measurements within normal limits will improve Reactivated Goal: Will remain free from infection Reactivated Goal: Diagnostic test results will improve Reactivated Goal: Respiratory complications will improve Reactivated Goal: Cardiovascular complication will be avoided Reactivated   Problem: Activity: Goal: Risk for activity intolerance will decrease Reactivated   Problem: Nutrition: Goal: Adequate nutrition will be maintained Reactivated   Problem: Coping: Goal: Level of anxiety will decrease Reactivated   Problem: Elimination: Goal: Will not experience complications related to bowel motility Reactivated Goal: Will not experience complications related to urinary retention Reactivated   Problem: Pain Managment: Goal: General experience of comfort will improve Reactivated   Problem: Safety: Goal: Ability to remain free from injury will improve Reactivated   Problem: Skin Integrity: Goal: Risk for impaired skin integrity will decrease Reactivated

## 2020-03-29 NOTE — Progress Notes (Signed)
Patient ID: Arthuro Canelo Kanzler, male   DOB: 1976-04-10, 44 y.o.   MRN: [id number] Postop day 1 status post right total knee arthroplasty patient continues to do well has mild thigh pain and moderate knee pain  CBC Latest Ref Rng & Units 03/27/2020 03/23/2020 09/17/2019  WBC 3.8 - 10.8 Thousand/uL 9.7 11.9(H) 8.5  Hemoglobin 13.2 - 17.1 g/dL 15.3 14.6 15.6  Hematocrit 38 - 50 % 45.5 43.6 47.0  Platelets 140 - 400 Thousand/uL 286 269 266    BMP Latest Ref Rng & Units 03/23/2020 09/17/2019 02/16/2019  Glucose 70 - 99 mg/dL 116(H) 88 99  BUN 6 - 20 mg/dL 12 16 15   Creatinine 0.61 - 1.24 mg/dL 1.01 1.01 0.94  BUN/Creat Ratio 9 - 20 - - -  Sodium 135 - 145 mmol/L 137 138 137  Potassium 3.5 - 5.1 mmol/L 3.8 3.7 4.2  Chloride 98 - 111 mmol/L 102 100 102  CO2 22 - 32 mmol/L 26 27 26   Calcium 8.9 - 10.3 mg/dL 8.9 9.1 8.9    PT indicates patient should be ready for discharge today I am in agreement

## 2020-03-29 NOTE — TOC Transition Note (Signed)
Transition of Care [facility]) - CM/SW Discharge Note  Patient Details  Name: [name] MRN: [id number] Date of Birth: [birth year]  Transition of Care [facility]) CM/SW Contact:  Sherie Don, LCSW Phone Number: 03/29/2020, 10:12 AM  Clinical Narrative: Patient is a 44 year old male who is under observation following right total knee replacement surgery. TOC received consult for HH/DME needs. CSW spoke with Tim at [place]. Tim confirmed he has received patient's [place] orders from physician. Per PT recommendations, patient is in need of a 3N1 due to toilet at home sitting low. CSW met with patient and wife, [name], to discuss [facility] and DME services. Per patient, he has a walker, cane, and crutches at home. CSW called [employer] and spoke with Freda Munro to make referral for 3N1. Physician notified of need for 3N1 order, which has been placed. TOC signing off.  Final next level of care: [place] Barriers to Discharge: Barriers Resolved  Patient Goals and CMS Choice Patient states their goals for this hospitalization and ongoing recovery are:: Discharge home CMS Medicare.gov Compare Post Acute Care list provided to:: Patient Choice offered to / list presented to : Patient  Discharge Plan and Services         DME Arranged: 3-N-1 DME Agency: [facility] Date DME Agency Contacted: 03/29/20 Time DME Agency Contacted: 1008 Representative spoke with at DME Agency: Freda Munro HH Arranged: PT [place]: [facility] [employer] Date [place]: 03/29/20 Time [place]: [phone] Representative spoke with at [place]: [place]  Readmission Risk Interventions No flowsheet data found.

## 2020-03-29 NOTE — Progress Notes (Signed)
Physical Therapy Treatment Patient Details Name: Victor [name] MRN: [id number] DOB: [birth year] Today's Date: 03/29/2020  RIGHT KNEE ROM:  2 - 100 degrees AMBULATION DISTANCE:  Mod Independent x  200 feet using RW    History of Present Illness Victor [name] is a 44 y/o male, s/p Right TKA, 03/28/20, with the diagnosis of osteoarthritis right knee.    PT Comments    Patient demonstrates good return for going up/dow stairs using bilateral side rails without loss of balance, patient's spouse present and observed, acknowledges understanding for proper position, step sequence for assisting patient up/down steps.  Patient demonstrates increased endurance/distance for ambulation without loss of balance and achieved increased right knee flexion up to 100 degrees self stretching while seated at bedside.  Patient tolerated sitting up in chair after therapy with RLE dangling and spouse present in room.  Patient will benefit from continued physical therapy in hospital and recommended venue below to increase strength, balance, endurance for safe ADLs and gait.   Follow Up Recommendations  Home health PT;Supervision for mobility/OOB;Supervision - Intermittent     Equipment Recommendations  3in1 (PT)    Recommendations for Other Services       Precautions / Restrictions Precautions Precautions: Fall Restrictions Weight Bearing Restrictions: Yes RLE Weight Bearing: Weight bearing as tolerated    Mobility  Bed Mobility Overal bed mobility: Needs Assistance Bed Mobility: Supine to Sit     Supine to sit: Modified independent (Device/Increase time);Supervision     General bed mobility comments: increased time, labored movement  Transfers Overall transfer level: Needs assistance Equipment used: Rolling walker (2 wheeled) Transfers: Sit to/from [facility] Sit to Stand: Modified independent (Device/Increase time);Supervision Stand pivot transfers: Modified independent  (Device/Increase time);Supervision       General transfer comment: verbal cues for proper hand placement, position of RLE during stand to sitting with good return demonstrated  Ambulation/Gait Ambulation/Gait assistance: Modified independent (Device/Increase time) Gait Distance (Feet): 200 Feet Assistive device: Rolling walker (2 wheeled) Gait Pattern/deviations: Decreased step length - right;Decreased stance time - right;Decreased stride length;Antalgic Gait velocity: decreased   General Gait Details: demonstrates increased endurance/distance for ambulation with good return for right heel to toe stepping without loss of balance   Stairs Stairs: Yes Stairs assistance: Modified independent (Device/Increase time);Supervision Stair Management: Two rails;Step to pattern Number of Stairs: 7 General stair comments: demonstrates good return for going up/down stairs using bilateral side rails without loss of balance (patient's spouse present for family training with understanding acknowledged for assisting)   Wheelchair Mobility    Modified Rankin (Stroke Patients Only)       Balance Overall balance assessment: Needs assistance Sitting-balance support: Feet supported;No upper extremity supported Sitting balance-Leahy Scale: Good Sitting balance - Comments: seated at EOB   Standing balance support: During functional activity;Bilateral upper extremity supported Standing balance-Leahy Scale: Fair Standing balance comment: fair/good using RW                            Cognition Arousal/Alertness: Awake/alert Behavior During Therapy: WFL for tasks assessed/performed Overall Cognitive Status: Within Functional Limits for tasks assessed                                        Exercises Other Exercises Other Exercises: seated self stretching into flexion of right knee using LLE x 20 second holds x  4 trials    General Comments        Pertinent  Vitals/Pain Pain Assessment: 0-10 Pain Score: 7  Pain Location: right knee Pain Descriptors / Indicators: Sore;Guarding Pain Intervention(s): Limited activity within patient's tolerance;Monitored during session;Premedicated before session;Repositioned    Home Living                      Prior Function            PT Goals (current goals can now be found in the care plan section) Acute Rehab PT Goals Patient Stated Goal: return home with family to assist PT Goal Formulation: With patient Time For Goal Achievement: 03/30/20 Potential to Achieve Goals: Good Progress towards PT goals: Progressing toward goals    Frequency    BID      PT Plan Current plan remains appropriate    Co-evaluation              AM-PAC PT "6 Clicks" Mobility   Outcome Measure  Help needed turning from your back to your side while in a flat bed without using bedrails?: None Help needed moving from lying on your back to sitting on the side of a flat bed without using bedrails?: None Help needed moving to and from a bed to a chair (including a wheelchair)?: A Little Help needed standing up from a chair using your arms (e.g., wheelchair or bedside chair)?: A Little Help needed to walk in hospital room?: None Help needed climbing 3-5 steps with a railing? : A Little 6 Click Score: 21    End of Session   Activity Tolerance: Patient tolerated treatment well Patient left: in chair;with call bell/phone within reach;with family/visitor present Nurse Communication: Mobility status PT Visit Diagnosis: Unsteadiness on feet (R26.81);Other abnormalities of gait and mobility (R26.89);Muscle weakness (generalized) (M62.81)     Time: 3009-2330 PT Time Calculation (min) (ACUTE ONLY): 32 min  Charges:  $Gait Training: 8-22 mins $Therapeutic Activity: 8-22 mins                     9:00 AM, 03/29/20 Lonell Grandchild, MPT Physical Therapist with [facility] [phone]  office [phone] mobile phone

## 2020-03-30 ENCOUNTER — Telehealth: Payer: Self-pay | Admitting: Orthopedic Surgery

## 2020-03-30 DIAGNOSIS — Z87891 Personal history of nicotine dependence: Secondary | ICD-10-CM | POA: Diagnosis not present

## 2020-03-30 DIAGNOSIS — K219 Gastro-esophageal reflux disease without esophagitis: Secondary | ICD-10-CM | POA: Diagnosis not present

## 2020-03-30 DIAGNOSIS — M1711 Unilateral primary osteoarthritis, right knee: Secondary | ICD-10-CM | POA: Diagnosis not present

## 2020-03-30 DIAGNOSIS — I10 Essential (primary) hypertension: Secondary | ICD-10-CM | POA: Diagnosis not present

## 2020-03-30 DIAGNOSIS — E785 Hyperlipidemia, unspecified: Secondary | ICD-10-CM | POA: Diagnosis not present

## 2020-03-30 DIAGNOSIS — M4807 Spinal stenosis, lumbosacral region: Secondary | ICD-10-CM | POA: Diagnosis not present

## 2020-03-30 DIAGNOSIS — Z471 Aftercare following joint replacement surgery: Secondary | ICD-10-CM | POA: Diagnosis not present

## 2020-03-30 DIAGNOSIS — Z9181 History of falling: Secondary | ICD-10-CM | POA: Diagnosis not present

## 2020-03-30 DIAGNOSIS — Z96651 Presence of right artificial knee joint: Secondary | ICD-10-CM | POA: Diagnosis not present

## 2020-03-30 NOTE — Telephone Encounter (Signed)
Sharyn Lull, PT from [facility] called requesting verbal orders for this patient 2 x a week for 2 weeks starting next week  She also wanted to tell Dr. Aline Brochure that Mr. Taras blood pressure was 162/72 at rest and he rated his pain 7 out of 10.  If you have any questions please call her at [phone]  Thanks

## 2020-03-31 LAB — BPAM RBC
Blood Product Expiration Date: 202108132359
Blood Product Expiration Date: 202108132359
Unit Type and Rh: 600
Unit Type and Rh: 600

## 2020-03-31 LAB — TYPE AND SCREEN
ABO/RH(D): A NEG
Antibody Screen: NEGATIVE
Unit division: 0
Unit division: 0

## 2020-03-31 NOTE — Telephone Encounter (Signed)
Called with verbal orders. To Dr Aline Brochure  Patient states pain scale 7 out of 10 and BP 162/72

## 2020-04-03 ENCOUNTER — Other Ambulatory Visit: Payer: Self-pay | Admitting: Orthopedic Surgery

## 2020-04-03 DIAGNOSIS — Z96651 Presence of right artificial knee joint: Secondary | ICD-10-CM | POA: Diagnosis not present

## 2020-04-03 MED ORDER — OXYCODONE HCL 5 MG PO TABS: 5.0000 mg | ORAL_TABLET | ORAL | 0 refills | Status: DC | PRN

## 2020-04-03 MED ORDER — TRAMADOL HCL 50 MG PO TABS: 50.0000 mg | ORAL_TABLET | Freq: Four times a day (QID) | ORAL | 0 refills | Status: DC

## 2020-04-03 NOTE — Discharge Summary (Signed)
Physician Discharge Summary  Patient ID: Victor [name] MRN: [id number] DOB/AGE: 1976/03/06 44 y.o.  Admit date: 03/28/2020 Discharge date: 03/29/2020  Admission Diagnoses: Osteoarthritis right knee  Discharge Diagnoses: Same  Discharged Condition: good  Procedure: Right total knee attune posterior stabilized fixed-bearing   Hospital Course:   August 3 patient did well with his surgery handled physical therapy well  August 4 patient discharged in stable condition with stable blood pressure  No signs of DVT  Ambulating well with a walker  CBC Latest Ref Rng & Units 03/27/2020 03/23/2020 09/17/2019  WBC 3.8 - 10.8 Thousand/uL 9.7 11.9(H) 8.5  Hemoglobin 13.2 - 17.1 g/dL 15.3 14.6 15.6  Hematocrit 38 - 50 % 45.5 43.6 47.0  Platelets 140 - 400 Thousand/uL 286 269 266   BMP Latest Ref Rng & Units 03/23/2020 09/17/2019 02/16/2019  Glucose 70 - 99 mg/dL 116(H) 88 99  BUN 6 - 20 mg/dL 12 16 15   Creatinine 0.61 - 1.24 mg/dL 1.01 1.01 0.94  BUN/Creat Ratio 9 - 20 - - -  Sodium 135 - 145 mmol/L 137 138 137  Potassium 3.5 - 5.1 mmol/L 3.8 3.7 4.2  Chloride 98 - 111 mmol/L 102 100 102  CO2 22 - 32 mmol/L 26 27 26   Calcium 8.9 - 10.3 mg/dL 8.9 9.1 8.9        Discharge Exam: BP 130/84 (BP Location: Right Arm)   Pulse 84   Temp 98.3 F (36.8 C)   Resp 18   Ht 5\' 11"  (1.803 m)   Wt 113.4 kg   SpO2 90%   BMI 34.87 kg/m      Disposition: Discharge disposition: 01-Home or Self Care       Discharge Instructions    Call MD / Call 911   Complete by: As directed    If you experience chest pain or shortness of breath, CALL 911 and be transported to the hospital emergency room.  If you develope a fever above 101 F, pus (white drainage) or increased drainage or redness at the wound, or calf pain, call your surgeon's office.   Constipation Prevention   Complete by: As directed    Drink plenty of fluids.  Prune juice [place] be helpful.  You Muckey use a stool softener, such as Colace  (over the counter) 100 mg twice a day.  Use MiraLax (over the counter) for constipation as needed.   Diet - low sodium heart healthy   Complete by: As directed    Discharge instructions   Complete by: As directed    Cpm 6 hrs a day   Ted hose 2 weeks   Bone foam 4 x a day 30 min   Increase activity slowly as tolerated   Complete by: As directed      Allergies as of 03/29/2020   No Known Allergies     Medication List    STOP taking these medications   HYDROcodone-acetaminophen 5-325 MG tablet Commonly known as: NORCO/VICODIN   ibuprofen 800 MG tablet Commonly known as: ADVIL     TAKE these medications   amLODipine 10 MG tablet Commonly known as: NORVASC Take 1 tablet (10 mg total) by mouth daily.   aspirin 81 MG chewable tablet Chew 1 tablet (81 mg total) by mouth 2 (two) times daily.   atorvastatin 80 MG tablet Commonly known as: LIPITOR Take 1 tablet (80 mg total) by mouth daily.   docusate sodium 100 MG capsule Commonly known as: COLACE Take 1 capsule (100 mg total)  by mouth 2 (two) times daily.   gabapentin 300 MG capsule Commonly known as: NEURONTIN Take 1 capsule (300 mg total) by mouth 3 (three) times daily.   methocarbamol 500 MG tablet Commonly known as: ROBAXIN Take 1 tablet (500 mg total) by mouth every 6 (six) hours as needed for muscle spasms.   nicotine 14 mg/24hr patch Commonly known as: Nicoderm CQ Place 1 patch (14 mg total) onto the skin daily.   oxyCODONE 5 MG immediate release tablet Commonly known as: Oxy IR/ROXICODONE Take 1 tablet (5 mg total) by mouth every 4 (four) hours as needed for up to 5 days for severe pain.   polyethylene glycol 17 g packet Commonly known as: MIRALAX / GLYCOLAX Take 17 g by mouth daily.   traMADol 50 MG tablet Commonly known as: ULTRAM Take 1 tablet (50 mg total) by mouth every 6 (six) hours for 5 days.        Signed: Arther [name] 04/03/2020, 1:09 PM

## 2020-04-03 NOTE — Telephone Encounter (Signed)
Patient requests refills on the following medications, post surgery: (1) oxyCODONE (OXY IR/ROXICODONE) 5 MG immediate release tablet 30 tablet  (2)  traMADol (ULTRAM) 50 MG tablet 20 tablet  -[facility], [address], [place] Hoff

## 2020-04-04 ENCOUNTER — Other Ambulatory Visit: Payer: Self-pay | Admitting: Radiology

## 2020-04-04 DIAGNOSIS — E785 Hyperlipidemia, unspecified: Secondary | ICD-10-CM | POA: Diagnosis not present

## 2020-04-04 DIAGNOSIS — Z96651 Presence of right artificial knee joint: Secondary | ICD-10-CM | POA: Diagnosis not present

## 2020-04-04 DIAGNOSIS — Z471 Aftercare following joint replacement surgery: Secondary | ICD-10-CM | POA: Diagnosis not present

## 2020-04-04 DIAGNOSIS — M4807 Spinal stenosis, lumbosacral region: Secondary | ICD-10-CM | POA: Diagnosis not present

## 2020-04-04 DIAGNOSIS — I10 Essential (primary) hypertension: Secondary | ICD-10-CM | POA: Diagnosis not present

## 2020-04-04 DIAGNOSIS — Z87891 Personal history of nicotine dependence: Secondary | ICD-10-CM | POA: Diagnosis not present

## 2020-04-04 DIAGNOSIS — Z9181 History of falling: Secondary | ICD-10-CM | POA: Diagnosis not present

## 2020-04-04 DIAGNOSIS — K219 Gastro-esophageal reflux disease without esophagitis: Secondary | ICD-10-CM | POA: Diagnosis not present

## 2020-04-04 MED ORDER — ONDANSETRON HCL 4 MG PO TABS: 4.0000 mg | ORAL_TABLET | Freq: Three times a day (TID) | ORAL | 0 refills | Status: DC | PRN

## 2020-04-04 NOTE — Telephone Encounter (Signed)
Dr Aline Brochure gave verbal orders for me to send in the nausea meds. I have sent for him.

## 2020-04-07 ENCOUNTER — Other Ambulatory Visit: Payer: Self-pay | Admitting: Orthopedic Surgery

## 2020-04-07 MED ORDER — OXYCODONE HCL 5 MG PO TABS: 5.0000 mg | ORAL_TABLET | ORAL | 0 refills | Status: DC | PRN

## 2020-04-07 MED ORDER — TRAMADOL HCL 50 MG PO TABS: 50.0000 mg | ORAL_TABLET | Freq: Four times a day (QID) | ORAL | 0 refills | Status: DC

## 2020-04-07 NOTE — Progress Notes (Signed)
Meds ordered this encounter  Medications  . traMADol (ULTRAM) 50 MG tablet    Sig: Take 1 tablet (50 mg total) by mouth every 6 (six) hours for 5 days.    Dispense:  20 tablet    Refill:  0  . oxyCODONE (OXY IR/ROXICODONE) 5 MG immediate release tablet    Sig: Take 1 tablet (5 mg total) by mouth every 4 (four) hours as needed for up to 5 days for severe pain.    Dispense:  30 tablet    Refill:  0

## 2020-04-07 NOTE — Addendum Note (Signed)
Addended byCandice Camp on: 04/07/2020 08:01 AM   Modules accepted: Orders

## 2020-04-10 ENCOUNTER — Telehealth: Payer: Self-pay

## 2020-04-10 NOTE — Telephone Encounter (Signed)
Victor [name] with [facility] left message on voicemail stating that he went out to do physical therapy with this patient and   couldn't get patient to answer the door or phone. Stated that patient had knee replacement, is real tight and needs PT. Stated he would send visit info back to the office for weekend PT and hopefully they will get with him and work on his therapy.

## 2020-04-10 NOTE — Telephone Encounter (Signed)
Sharyn Lull with [facility] called also stating that she went by Saturday to do therapy for this patient and he was not available.

## 2020-04-11 ENCOUNTER — Ambulatory Visit: Payer: Medicaid Other | Admitting: Orthopedic Surgery

## 2020-04-11 NOTE — Progress Notes (Signed)
POSTOP VISIT  POD # 15  S/p RT TKA  There were no vitals taken for this visit.  Encounter Diagnosis  Name Primary?   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  Yes     Chief Complaint  Patient presents with   Routine Post Op    03/28/20 knee replacement     44 year old male status post right total knee patient is getting pain control with oxycodone and tramadol  Patient is losing motion left the hospital with 2-100 degrees comes in today with 15--85 degrees  Very concerning I let him know that we have to make some progress in the next 2 weeks  He is doing the CPM twice a day the bone foam 2-3 times a day  Recommend that he increase the CPM to four times a day and the bone foam to 3-4 times a day  Staples were extracted wound looks good no signs of infection tourniquet pain is resolved    Current Outpatient Medications:    amLODipine (NORVASC) 10 MG tablet, Take 1 tablet (10 mg total) by mouth daily., Disp: 180 tablet, Rfl: 3   aspirin 81 MG chewable tablet, Chew 1 tablet (81 mg total) by mouth 2 (two) times daily., Disp: 60 tablet, Rfl: 0   atorvastatin (LIPITOR) 80 MG tablet, Take 1 tablet (80 mg total) by mouth daily., Disp: 90 tablet, Rfl: 3   docusate sodium (COLACE) 100 MG capsule, Take 1 capsule (100 mg total) by mouth 2 (two) times daily., Disp: 10 capsule, Rfl: 0   methocarbamol (ROBAXIN) 500 MG tablet, Take 1 tablet (500 mg total) by mouth every 6 (six) hours as needed for muscle spasms., Disp: 56 tablet, Rfl: 1   nicotine (NICODERM CQ) 14 mg/24hr patch, Place 1 patch (14 mg total) onto the skin daily., Disp: 28 patch, Rfl: 0   ondansetron (ZOFRAN) 4 MG tablet, Take 1 tablet (4 mg total) by mouth every 8 (eight) hours as needed for nausea or vomiting., Disp: 20 tablet, Rfl: 0   oxyCODONE (OXY IR/ROXICODONE) 5 MG immediate release tablet, Take 1 tablet (5 mg total) by mouth every 4 (four) hours as needed for up to 5 days for severe pain., Disp: 30 tablet, Rfl:  0   polyethylene glycol (MIRALAX / GLYCOLAX) 17 g packet, Take 17 g by mouth daily., Disp: 14 each, Rfl: 0   traMADol (ULTRAM) 50 MG tablet, Take 1 tablet (50 mg total) by mouth every 6 (six) hours for 5 days., Disp: 20 tablet, Rfl: 0   Weightbearing as tolerated with crutches he is doing that pretty goo  Postoperative plan (Work, [employer],  Meds ordered this encounter  Medications   oxyCODONE (OXY IR/ROXICODONE) 5 MG immediate release tablet    Sig: Take 1 tablet (5 mg total) by mouth every 4 (four) hours as needed for up to 5 days for severe pain.    Dispense:  30 tablet    Refill:  0   traMADol (ULTRAM) 50 MG tablet    Sig: Take 1 tablet (50 mg total) by mouth every 6 (six) hours for 5 days.    Dispense:  20 tablet    Refill:  0  ,FU)  2 weeks

## 2020-04-12 ENCOUNTER — Encounter: Payer: Self-pay | Admitting: Orthopedic Surgery

## 2020-04-12 ENCOUNTER — Other Ambulatory Visit: Payer: Self-pay

## 2020-04-12 ENCOUNTER — Other Ambulatory Visit: Payer: Self-pay | Admitting: Family Medicine

## 2020-04-12 ENCOUNTER — Ambulatory Visit (INDEPENDENT_AMBULATORY_CARE_PROVIDER_SITE_OTHER): Payer: Medicaid Other | Admitting: Orthopedic Surgery

## 2020-04-12 DIAGNOSIS — Z96651 Presence of right artificial knee joint: Secondary | ICD-10-CM

## 2020-04-12 MED ORDER — OXYCODONE HCL 5 MG PO TABS: 5.0000 mg | ORAL_TABLET | ORAL | 0 refills | Status: AC | PRN

## 2020-04-12 MED ORDER — AMLODIPINE BESYLATE 10 MG PO TABS: 10.0000 mg | ORAL_TABLET | Freq: Every day | ORAL | 0 refills | Status: DC

## 2020-04-12 MED ORDER — TRAMADOL HCL 50 MG PO TABS: 50.0000 mg | ORAL_TABLET | Freq: Four times a day (QID) | ORAL | 0 refills | Status: AC

## 2020-04-12 NOTE — Telephone Encounter (Signed)
  Prescription Request  04/12/2020  What is the name of the medication or equipment? Amlodipine 10 mg. Patient has appt 9-7 with Lajuana Ripple and needs enough to carry him through to that appt. Just had a knee replacement two weeks ago  Have you contacted your pharmacy to request a refill? (if applicable) YES  Which pharmacy would you like this sent to? [facility] in [place]   Patient notified that their request is being sent to the clinical staff for review and that they should receive a response within 2 business days.

## 2020-04-12 NOTE — Telephone Encounter (Signed)
Gottschalk. NTBS LOV for Dx 03/03/18 all others have been acutes

## 2020-04-12 NOTE — Telephone Encounter (Signed)
Rf sent in °

## 2020-04-12 NOTE — Patient Instructions (Signed)
Bone foam 4 x a day for 30 min   cpm 4 x a a day for 30 min

## 2020-04-13 NOTE — Telephone Encounter (Signed)
Left message for patient to call back to schedule appointment for medication refill.

## 2020-04-14 DIAGNOSIS — K219 Gastro-esophageal reflux disease without esophagitis: Secondary | ICD-10-CM | POA: Diagnosis not present

## 2020-04-14 DIAGNOSIS — Z87891 Personal history of nicotine dependence: Secondary | ICD-10-CM | POA: Diagnosis not present

## 2020-04-14 DIAGNOSIS — Z9181 History of falling: Secondary | ICD-10-CM | POA: Diagnosis not present

## 2020-04-14 DIAGNOSIS — E785 Hyperlipidemia, unspecified: Secondary | ICD-10-CM | POA: Diagnosis not present

## 2020-04-14 DIAGNOSIS — M4807 Spinal stenosis, lumbosacral region: Secondary | ICD-10-CM | POA: Diagnosis not present

## 2020-04-14 DIAGNOSIS — I10 Essential (primary) hypertension: Secondary | ICD-10-CM | POA: Diagnosis not present

## 2020-04-14 DIAGNOSIS — Z471 Aftercare following joint replacement surgery: Secondary | ICD-10-CM | POA: Diagnosis not present

## 2020-04-14 DIAGNOSIS — Z96651 Presence of right artificial knee joint: Secondary | ICD-10-CM | POA: Diagnosis not present

## 2020-04-19 ENCOUNTER — Other Ambulatory Visit: Payer: Self-pay

## 2020-04-19 ENCOUNTER — Ambulatory Visit: Payer: Medicaid Other | Attending: Orthopedic Surgery | Admitting: Physical Therapy

## 2020-04-19 ENCOUNTER — Encounter: Payer: Self-pay | Admitting: Physical Therapy

## 2020-04-19 DIAGNOSIS — M6281 Muscle weakness (generalized): Secondary | ICD-10-CM | POA: Diagnosis not present

## 2020-04-19 DIAGNOSIS — G8929 Other chronic pain: Secondary | ICD-10-CM

## 2020-04-19 DIAGNOSIS — M25561 Pain in right knee: Secondary | ICD-10-CM | POA: Insufficient documentation

## 2020-04-19 DIAGNOSIS — R600 Localized edema: Secondary | ICD-10-CM | POA: Insufficient documentation

## 2020-04-19 DIAGNOSIS — M25661 Stiffness of right knee, not elsewhere classified: Secondary | ICD-10-CM | POA: Diagnosis not present

## 2020-04-19 NOTE — Therapy (Signed)
[facility] [place], [place], [address] Phone: [phone]   Fax:  [phone]  Physical Therapy Evaluation  Patient Details  Name: Victor [name] MRN: [id number] Date of Birth: 02-03-1976 Referring Provider (PT): Arther Abbott MD   Encounter Date: 04/19/2020   PT End of Session - 04/19/20 1256    Visit Number 1    Number of Visits 12    Date for PT Re-Evaluation 05/31/20    PT Start Time 1030    PT Stop Time 1101    PT Time Calculation (min) 31 min    Activity Tolerance Patient tolerated treatment well    Behavior During Therapy [facility] for tasks assessed/performed           Past Medical History:  Diagnosis Date  . GERD (gastroesophageal reflux disease)   . History of stomach ulcers   . Hypertension     Past Surgical History:  Procedure Laterality Date  . ANTERIOR CRUCIATE LIGAMENT REPAIR Right 05/19/2018   Procedure: RIGHT KNEE ARTHROSCOPY WITH ANTERIOR CRUCIATE LIGAMENT (ACL) REPAIR and MEDIAL MENISECTOMY;  Surgeon: [name], MD;  Location: [facility];  Service: Orthopedics;  Laterality: Right;  . APPENDECTOMY    . HARDWARE REMOVAL Right 09/21/2019   Procedure: HARDWARE REMOVAL (RICHARD'S STAPLE) RIGHT KNEE;  Surgeon: [name], MD;  Location: [facility];  Service: Orthopedics;  Laterality: Right;  . KNEE ARTHROSCOPY WITH MEDIAL MENISECTOMY Right 05/29/2017   Procedure: KNEE ARTHROSCOPY WITH MEDIAL MENISECTOMY and lateral meniscectomy;  Surgeon: [name], MD;  Location: [facility];  Service: Orthopedics;  Laterality: Right;  . KNEE ARTHROSCOPY WITH MEDIAL MENISECTOMY Right 02/05/2018   Procedure: RIGHT KNEE ARTHROSCOPY WITH PARTIAL MEDIAL AND LATERAL MENISECTOMY;  Surgeon: [name], MD;  Location: [facility];  Service: Orthopedics;  Laterality: Right;  . TOTAL KNEE ARTHROPLASTY Right 03/28/2020   Procedure: TOTAL KNEE ARTHROPLASTY;  Surgeon: [name], MD;  Location: [facility];   Service: Orthopedics;  Laterality: Right;    There were no vitals filed for this visit.    Subjective Assessment - 04/19/20 1221    Subjective COVID-19 screen performed prior to patient entering clinic.  The patient presenst to the clinic today s/p right total knee replacement performed on 03/28/20.  He is walking today with bilateral axillary crutches.  His pain is an 8/10.  He has been compliant to his HEP which includes a Zero Knee to assist with extension stretching.  Medication decreases pain and walking and exercise increases his pain.    Pertinent History HTN, previous right knee scope and ACL reconstruction, HTN.    How long can you walk comfortably? Around home with bilateral axillary crutches.    Patient Stated Goals Perform ADL's with less pain.    Currently in Pain? Yes    Pain Score 8     Pain Location Knee    Pain Orientation Right    Pain Descriptors / Indicators Aching;Numbness;Sore    Pain Type Surgical pain    Pain Onset 1 to 4 weeks ago    Pain Frequency Constant    Aggravating Factors  See above.    Pain Relieving Factors See above.              [facility] PT Assessment - 04/19/20 0001      Assessment   Medical Diagnosis S/p total knee replacement, right.    Referring Provider (PT) Arther Abbott MD    Onset Date/Surgical Date 03/28/20   Surgery date.  Precautions   Precaution Comments No ultrasound.      Restrictions   Weight Bearing Restrictions No      Balance Screen   Has the patient fallen in the past 6 months No    Has the patient had a decrease in activity level because of a fear of falling?  Yes    Is the patient reluctant to leave their home because of a fear of falling?  No      Home Environment   Living Environment Private residence      Prior Function   Level of Independence Independent      Observation/Other Assessments   Observations Patient's right knee incisional site looks to be healing well.      Observation/Other  Assessments-Edema    Edema Circumferential      Circumferential Edema   Circumferential - Right RT 7 cms > LT.      ROM / Strength   AROM / PROM / Strength AROM;Strength      AROM   Overall AROM Comments In supine the patient's right knee extension is -25 degrees and passive to -20 degrees with active flexion to 70 degrees.      Strength   Overall Strength Comments Right hip flexion and right knee extension graded at 2 to 2+/5.      Palpation   Palpation comment C/o diffuse anterior right knee pain.      Ambulation/Gait   Gait Comments The patient is walking slowly and cautiously with bilateral axillary crutches.                      Objective measurements completed on examination: See above findings.                    PT Long Term Goals - 04/19/20 1243      PT LONG TERM GOAL #1   Title Independent with a HEP.    Time 6    Period Weeks    Status New      PT LONG TERM GOAL #2   Title Full active right knee extension in order to normalize gait.    Time 6    Period Weeks    Status New      PT LONG TERM GOAL #3   Title Active right knee flexion to 115 degrees+ so the patient can perform functional tasks and do so with pain not > 2-3/10.    Time 6    Period Weeks    Status New      PT LONG TERM GOAL #4   Title Increase right hip and knee strength to a solid 4+/5 to provide good stability for accomplishment of functional activities.    Time 6    Period Weeks    Status New      PT LONG TERM GOAL #5   Title Decrease edema to within 2.5 cms of non-affected side to assist with pain reduction and range of motion gains.    Time 6    Period Weeks    Status New      PT LONG TERM GOAL #6   Title Perform a reciprocating stair gait with one railing with pain not > 2-3/10.    Time 6    Period Weeks    Status New      PT LONG TERM GOAL #7   Title Walk a community distance without assistive device.    Time 6    Period  Weeks    Status New                   Plan - 04/19/20 1232    Clinical Impression Statement The patient presents to OPPT s/p right total knee replacement performed on 03/28/20.  Currently he is lacking a significant amount of right knee flexion and extension.  He currently has moderate+ swelling as well.  He is unable to perform active antigravity SLR's and short arc quads.  He is walking slowly and cautiously with bilateral axillary crutches.  Patient will benefit from skilled physical therapy intervention to address deficits and pain.    Personal Factors and Comorbidities Comorbidity 1;Comorbidity 2    Comorbidities HTN, previous right knee scope and ACL reconstruction, HTN.    Examination-Activity Limitations Locomotion Level;Other;Stairs    Examination-Participation Restrictions Other;Yard Work    Stability/Clinical Decision Making Stable/Uncomplicated    Designer, jewellery Low    Rehab Potential Good    PT Frequency 2x / week    PT Duration 6 weeks    PT Treatment/Interventions ADLs/Self Care Home Management;Neuromuscular re-education;Therapeutic exercise;Therapeutic activities;Functional mobility training;Patient/family education;Manual techniques;Passive range of motion    PT Next Visit Plan Patient's insurance provider does not cover e'stim, CP or vasopnuematic. Progress patient into TKA protocol.  Start with Nustep.  Needs PROM to gain right knee flexion and extension.           Patient will benefit from skilled therapeutic intervention in order to improve the following deficits and impairments:  Pain, Decreased activity tolerance, Increased edema, Decreased strength, Abnormal gait, Decreased range of motion  Visit Diagnosis: Chronic pain of right knee - Plan: PT plan of care cert/re-cert  Muscle weakness (generalized) - Plan: PT plan of care cert/re-cert  Localized edema - Plan: PT plan of care cert/re-cert  Stiffness of right knee, not elsewhere classified - Plan: PT plan of care  cert/re-cert     Problem List Patient Active Problem List   Diagnosis Date Noted  . H/O total knee replacement, 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  . S/P hardware removal right knee 09/21/19 10/04/2019  . Essential hypertension 04/07/2019  . Family history of early CAD 04/07/2019  . History of chest pain 03/10/2019  . Hyperlipidemia 03/10/2019  . Tobacco abuse 03/10/2019  . S/P right knee arthroscopy 02/05/18 06/23/2017  . S/P ACL repair right 05/19/18   . Chondromalacia of lateral femoral condyle, right   . Chondromalacia, patella, right     Mylin Gignac, [place] MPT 04/19/2020, 12:58 PM  [facility] [address] [place], [place], [address] Phone: [phone]   Fax:  [phone]  Name: Victor [name] MRN: [id number] Date of Birth: [birth year]

## 2020-04-21 ENCOUNTER — Other Ambulatory Visit: Payer: Self-pay | Admitting: Orthopedic Surgery

## 2020-04-21 DIAGNOSIS — Z96651 Presence of right artificial knee joint: Secondary | ICD-10-CM

## 2020-04-21 MED ORDER — HYDROCODONE-ACETAMINOPHEN 10-325 MG PO TABS: 1.0000 | ORAL_TABLET | Freq: Four times a day (QID) | ORAL | 0 refills | Status: DC | PRN

## 2020-04-21 MED ORDER — TRAMADOL HCL 50 MG PO TABS: 50.0000 mg | ORAL_TABLET | Freq: Four times a day (QID) | ORAL | 0 refills | Status: DC | PRN

## 2020-04-26 ENCOUNTER — Ambulatory Visit (INDEPENDENT_AMBULATORY_CARE_PROVIDER_SITE_OTHER): Payer: Medicaid Other | Admitting: Orthopedic Surgery

## 2020-04-26 ENCOUNTER — Encounter: Payer: Self-pay | Admitting: Orthopedic Surgery

## 2020-04-26 ENCOUNTER — Other Ambulatory Visit: Payer: Self-pay

## 2020-04-26 DIAGNOSIS — Z96651 Presence of right artificial knee joint: Secondary | ICD-10-CM

## 2020-04-26 DIAGNOSIS — C44622 Squamous cell carcinoma of skin of right upper limb, including shoulder: Secondary | ICD-10-CM | POA: Diagnosis not present

## 2020-04-26 DIAGNOSIS — L578 Other skin changes due to chronic exposure to nonionizing radiation: Secondary | ICD-10-CM | POA: Diagnosis not present

## 2020-04-26 DIAGNOSIS — L821 Other seborrheic keratosis: Secondary | ICD-10-CM | POA: Diagnosis not present

## 2020-04-26 DIAGNOSIS — D225 Melanocytic nevi of trunk: Secondary | ICD-10-CM | POA: Diagnosis not present

## 2020-04-26 MED ORDER — HYDROCODONE-ACETAMINOPHEN 10-325 MG PO TABS: 1.0000 | ORAL_TABLET | Freq: Four times a day (QID) | ORAL | 0 refills | Status: DC | PRN

## 2020-04-26 MED ORDER — TRAMADOL HCL 50 MG PO TABS: 50.0000 mg | ORAL_TABLET | Freq: Four times a day (QID) | ORAL | 0 refills | Status: AC | PRN

## 2020-04-26 NOTE — Progress Notes (Signed)
Chief Complaint  Patient presents with  . Routine Post Op    03/28/20 right knee replacement had one visit PT last week, goes again tomorrow    Gladys comes in today 4 weeks postop with his extension improved within 5 degrees of his normal leg  His flexion still only about 80 is gotten 1 or 2 therapy visits we have decreased his Percocet to hydrocodone  His knee shows no signs of infection  He is encouraged to do as much as he can at home until he can get into see the therapist   Meds ordered this encounter  Medications  . HYDROcodone-acetaminophen (NORCO) 10-325 MG tablet    Sig: Take 1 tablet by mouth every 6 (six) hours as needed for up to 7 days.    Dispense:  28 tablet    Refill:  0  . traMADol (ULTRAM) 50 MG tablet    Sig: Take 1 tablet (50 mg total) by mouth every 6 (six) hours as needed for up to 7 days.    Dispense:  28 tablet    Refill:  0    Fu 2 weeks   No s/s of infection

## 2020-04-27 ENCOUNTER — Encounter: Payer: Self-pay | Admitting: Physical Therapy

## 2020-04-27 ENCOUNTER — Ambulatory Visit: Payer: Medicaid Other | Attending: Orthopedic Surgery | Admitting: Physical Therapy

## 2020-04-27 DIAGNOSIS — R600 Localized edema: Secondary | ICD-10-CM | POA: Diagnosis not present

## 2020-04-27 DIAGNOSIS — M79604 Pain in right leg: Secondary | ICD-10-CM | POA: Diagnosis not present

## 2020-04-27 DIAGNOSIS — M25661 Stiffness of right knee, not elsewhere classified: Secondary | ICD-10-CM | POA: Insufficient documentation

## 2020-04-27 DIAGNOSIS — G8929 Other chronic pain: Secondary | ICD-10-CM | POA: Diagnosis not present

## 2020-04-27 DIAGNOSIS — M25561 Pain in right knee: Secondary | ICD-10-CM | POA: Diagnosis not present

## 2020-04-27 DIAGNOSIS — M6281 Muscle weakness (generalized): Secondary | ICD-10-CM | POA: Insufficient documentation

## 2020-04-27 NOTE — Therapy (Signed)
[facility] [place], [place], [address] Phone: [phone]   Fax:  [phone]  Physical Therapy Treatment  Patient Details  Name: Radek Carnero Nylund MRN: [id number] Date of Birth: 08-14-1976 Referring Provider (PT): Arther Abbott MD   Encounter Date: 04/27/2020   PT End of Session - 04/27/20 1036    Visit Number 2    Number of Visits 12    Date for PT Re-Evaluation 05/31/20    PT Start Time 1033    PT Stop Time 1125    PT Time Calculation (min) 52 min    Activity Tolerance Patient tolerated treatment well    Behavior During Therapy [facility] for tasks assessed/performed           Past Medical History:  Diagnosis Date  . GERD (gastroesophageal reflux disease)   . History of stomach ulcers   . Hypertension     Past Surgical History:  Procedure Laterality Date  . ANTERIOR CRUCIATE LIGAMENT REPAIR Right 05/19/2018   Procedure: RIGHT KNEE ARTHROSCOPY WITH ANTERIOR CRUCIATE LIGAMENT (ACL) REPAIR and MEDIAL MENISECTOMY;  Surgeon: [name], MD;  Location: [facility];  Service: Orthopedics;  Laterality: Right;  . APPENDECTOMY    . HARDWARE REMOVAL Right 09/21/2019   Procedure: HARDWARE REMOVAL (RICHARD'S STAPLE) RIGHT KNEE;  Surgeon: [name], MD;  Location: [facility];  Service: Orthopedics;  Laterality: Right;  . KNEE ARTHROSCOPY WITH MEDIAL MENISECTOMY Right 05/29/2017   Procedure: KNEE ARTHROSCOPY WITH MEDIAL MENISECTOMY and lateral meniscectomy;  Surgeon: [name], MD;  Location: [facility];  Service: Orthopedics;  Laterality: Right;  . KNEE ARTHROSCOPY WITH MEDIAL MENISECTOMY Right 02/05/2018   Procedure: RIGHT KNEE ARTHROSCOPY WITH PARTIAL MEDIAL AND LATERAL MENISECTOMY;  Surgeon: [name], MD;  Location: [facility];  Service: Orthopedics;  Laterality: Right;  . TOTAL KNEE ARTHROPLASTY Right 03/28/2020   Procedure: TOTAL KNEE ARTHROPLASTY;  Surgeon: [name], MD;  Location: [facility];  Service:  Orthopedics;  Laterality: Right;    There were no vitals filed for this visit.   Subjective Assessment - 04/27/20 1034    Subjective COVID 19 screening performed on patient upon arrival. Patient reports stiffness and tightness.    Pertinent History HTN, previous right knee scope and ACL reconstruction, HTN.    How long can you walk comfortably? Around home with bilateral axillary crutches.    Patient Stated Goals Perform ADL's with less pain.    Currently in Pain? Yes    Pain Score 7     Pain Location Knee    Pain Orientation Right    Pain Descriptors / Indicators Tightness;Discomfort    Pain Type Surgical pain    Pain Onset 1 to 4 weeks ago    Pain Frequency Constant              [facility] PT Assessment - 04/27/20 0001      Assessment   Medical Diagnosis S/p total knee replacement, right.    Referring Provider (PT) Arther Abbott MD    Onset Date/Surgical Date 03/28/20    Hand Dominance Right    Next MD Visit 05/10/2020      Precautions   Precaution Comments No ultrasound.      Restrictions   Weight Bearing Restrictions No      ROM / Strength   AROM / PROM / Strength AROM      AROM   Overall AROM  Deficits    AROM Assessment Site Knee    Right/Left  Knee Right    Right Knee Flexion 79                         [facility] Adult PT Treatment/Exercise - 04/27/20 0001      Exercises   Exercises Knee/Hip      Knee/Hip Exercises: Aerobic   Nustep L4, seat 11-10 x15 min for ROM, monitored      Knee/Hip Exercises: Standing   Forward Lunges Right;15 reps;5 seconds    Forward Lunges Limitations off 8" step    Rocker Board 3 minutes      Knee/Hip Exercises: Supine   Quad Sets Strengthening;Right;2 sets;10 reps    Quad Sets Limitations 5 sec holds    Short Arc [employer] AROM;5 reps    Short Arc Quad Sets Limitations limited by deficient quad activation and strength    Heel Slides AROM;Right;10 reps      Modalities   Modalities Vasopneumatic       Vasopneumatic   Number Minutes Vasopneumatic  10 minutes    Vasopnuematic Location  Knee    Vasopneumatic Pressure Medium    Vasopneumatic Temperature  34/pain and post-surgical edema      Manual Therapy   Manual Therapy Passive ROM    Passive ROM PROM of R knee into flexion, extension with gentle holds and intermittnat oscillations to reduce pain and guarding                       PT Long Term Goals - 04/19/20 1243      PT LONG TERM GOAL #1   Title Independent with a HEP.    Time 6    Period Weeks    Status New      PT LONG TERM GOAL #2   Title Full active right knee extension in order to normalize gait.    Time 6    Period Weeks    Status New      PT LONG TERM GOAL #3   Title Active right knee flexion to 115 degrees+ so the patient can perform functional tasks and do so with pain not > 2-3/10.    Time 6    Period Weeks    Status New      PT LONG TERM GOAL #4   Title Increase right hip and knee strength to a solid 4+/5 to provide good stability for accomplishment of functional activities.    Time 6    Period Weeks    Status New      PT LONG TERM GOAL #5   Title Decrease edema to within 2.5 cms of non-affected side to assist with pain reduction and range of motion gains.    Time 6    Period Weeks    Status New      PT LONG TERM GOAL #6   Title Perform a reciprocating stair gait with one railing with pain not > 2-3/10.    Time 6    Period Weeks    Status New      PT LONG TERM GOAL #7   Title Walk a community distance without assistive device.    Time 6    Period Weeks    Status New                 Plan - 04/27/20 1122    Clinical Impression Statement Patient presented in clinic with reports of tightness of R knee. Patient using B axillary crutches  for ambulation with limited hip/knee flexion. Patient guided through light ROM exercises as well as stretching. Patient limited with knee flexion and full knee extension which could be limited by  increased edema notable throughout R knee. Intermittant scabbing noted over R knee incision. Very limited R quad activation noted during SAQ thus regressed to QS. Patient reports compliance with HH HEP and icing and elevation. Normal vasopnuematic response after use of machine secondary to post surgical edema.    Personal Factors and Comorbidities Comorbidity 1;Comorbidity 2    Comorbidities HTN, previous right knee scope and ACL reconstruction, HTN.    Examination-Activity Limitations Locomotion Level;Other;Stairs    Examination-Participation Restrictions Other;Yard Work    Stability/Clinical Decision Making Stable/Uncomplicated    Rehab Potential Good    PT Frequency 2x / week    PT Duration 6 weeks    PT Treatment/Interventions ADLs/Self Care Home Management;Neuromuscular re-education;Therapeutic exercise;Therapeutic activities;Functional mobility training;Patient/family education;Manual techniques;Passive range of motion    PT Next Visit Plan Patient's insurance provider does not cover e'stim, CP or vasopnuematic. Progress patient into TKA protocol.  Start with Nustep.  Needs PROM to gain right knee flexion and extension.    Consulted and Agree with Plan of Care Patient           Patient will benefit from skilled therapeutic intervention in order to improve the following deficits and impairments:  Pain, Decreased activity tolerance, Increased edema, Decreased strength, Abnormal gait, Decreased range of motion  Visit Diagnosis: Chronic pain of right knee  Muscle weakness (generalized)  Localized edema  Stiffness of right knee, not elsewhere classified     Problem List Patient Active Problem List   Diagnosis Date Noted  . H/O total knee replacement, 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  . S/P hardware removal right knee 09/21/19 10/04/2019  . Essential hypertension 04/07/2019  . Family history of early CAD 04/07/2019  . History of chest pain 03/10/2019  . Hyperlipidemia 03/10/2019  . Tobacco  abuse 03/10/2019  . S/P right knee arthroscopy 02/05/18 06/23/2017  . S/P ACL repair right 05/19/18   . Chondromalacia of lateral femoral condyle, right   . Chondromalacia, patella, right     Standley Brooking, PTA 04/27/2020, 12:00 PM  [facility] [place], [place], [address] Phone: [phone]   Fax:  [phone]  Name: Ugo Thoma Maule MRN: [id number] Date of Birth: 25-Feb-1976

## 2020-05-01 NOTE — Progress Notes (Signed)
Subjective: CC:HTN, HLD PCP: [name], DO Victor [name] is a 44 y.o. male presenting to clinic today for:  1. HTN w/ HLD; knee surgery Patient reports compliance with Norvasc 10 mg daily.  He is no longer taking Lipitor.  No chest pain, shortness of breath, visual disturbance, headache.  He is status post right knee surgery.  He continues to have about 3 months of physical therapy ahead of him.  He is closely followed by orthopedics and is currently on pain medication.  Continues to smoke  ROS: Per HPI  No Known Allergies Past Medical History:  Diagnosis Date  . GERD (gastroesophageal reflux disease)   . History of stomach ulcers   . Hypertension     Current Outpatient Medications:  .  amLODipine (NORVASC) 10 MG tablet, Take 1 tablet (10 mg total) by mouth daily., Disp: 90 tablet, Rfl: 0 .  aspirin 81 MG chewable tablet, Chew 1 tablet (81 mg total) by mouth 2 (two) times daily., Disp: 60 tablet, Rfl: 0 .  atorvastatin (LIPITOR) 80 MG tablet, Take 1 tablet (80 mg total) by mouth daily., Disp: 90 tablet, Rfl: 3 .  docusate sodium (COLACE) 100 MG capsule, Take 1 capsule (100 mg total) by mouth 2 (two) times daily., Disp: 10 capsule, Rfl: 0 .  HYDROcodone-acetaminophen (NORCO) 10-325 MG tablet, Take 1 tablet by mouth every 6 (six) hours as needed for up to 7 days., Disp: 28 tablet, Rfl: 0 .  methocarbamol (ROBAXIN) 500 MG tablet, Take 1 tablet (500 mg total) by mouth every 6 (six) hours as needed for muscle spasms., Disp: 56 tablet, Rfl: 1 .  nicotine (NICODERM CQ) 14 mg/24hr patch, Place 1 patch (14 mg total) onto the skin daily., Disp: 28 patch, Rfl: 0 .  ondansetron (ZOFRAN) 4 MG tablet, Take 1 tablet (4 mg total) by mouth every 8 (eight) hours as needed for nausea or vomiting., Disp: 20 tablet, Rfl: 0 .  polyethylene glycol (MIRALAX / GLYCOLAX) 17 g packet, Take 17 g by mouth daily., Disp: 14 each, Rfl: 0 .  traMADol (ULTRAM) 50 MG tablet, Take 1 tablet (50 mg total)  by mouth every 6 (six) hours as needed for up to 7 days., Disp: 28 tablet, Rfl: 0 Social History   Socioeconomic History  . Marital status: Married    Spouse name: Not on file  . Number of children: 3  . Years of education: Not on file  . Highest education level: Not on file  Occupational History  . Not on file  Tobacco Use  . Smoking status: Former Smoker    Packs/day: 0.50    Years: 15.00    Pack years: 7.50    Types: Cigarettes    Start date: 07/07/1991    Quit date: 04/09/2018    Years since quitting: 2.0  . Smokeless tobacco: Former Systems developer    Types: Chew    Quit date: 06/09/2018  Vaping Use  . Vaping Use: Never used  Substance and Sexual Activity  . Alcohol use: No  . Drug use: No  . Sexual activity: Yes  Other Topics Concern  . Not on file  Social History Narrative  . Not on file   Social Determinants of Health   Financial Resource Strain:   . Difficulty of Paying Living Expenses: Not on file  Food Insecurity:   . Worried About Charity fundraiser in the Last Year: Not on file  . Ran Out of Food in the Last Year: Not  on file  Transportation Needs:   . Lack of Transportation (Medical): Not on file  . Lack of Transportation (Non-Medical): Not on file  Physical Activity:   . Days of Exercise per Week: Not on file  . Minutes of Exercise per Session: Not on file  Stress:   . Feeling of Stress : Not on file  Social Connections:   . Frequency of Communication with Friends and Family: Not on file  . Frequency of Social Gatherings with Friends and Family: Not on file  . Attends Religious Services: Not on file  . Active Member of Clubs or Organizations: Not on file  . Attends Archivist Meetings: Not on file  . Marital Status: Not on file  Intimate Partner Violence:   . Fear of Current or Ex-Partner: Not on file  . Emotionally Abused: Not on file  . Physically Abused: Not on file  . Sexually Abused: Not on file   Family History  Problem Relation Age  of Onset  . Diabetes Mother   . Heart failure Mother   . Cancer Mother   . Pancreatic cancer Mother   . Cancer Father   . Throat cancer Father   . Healthy Sister   . Head & neck cancer Brother   . Bone cancer Maternal Grandmother   . Diabetes Maternal Grandmother   . CAD Maternal Grandfather   . Brain cancer Maternal Grandfather   . Colon cancer Paternal Grandfather     Objective: Office vital signs reviewed. BP 135/89   Pulse 97   Temp 98.2 F (36.8 C) (Temporal)   Ht '5\' 11"'  (1.803 m)   Wt 231 lb (104.8 kg)   SpO2 97%   BMI 32.22 kg/m   Physical Examination:  General: Awake, alert, well nourished, smells of tobacco, somewhat uncomfortable with ambulation HEENT: Normal, no carotid bruits. Sclera white, MMM Cardio: regular rate and rhythm, S1S2 heard, no murmurs appreciated Pulm: clear to auscultation bilaterally, no wheezes, rhonchi or rales; normal work of breathing on room air Extremities: warm, well perfused, No edema, cyanosis or clubbing; +2 pulses bilaterally MSK: antalgic gait and station; using crutch for ambulation Neuro: no focal deficits  Assessment/ Plan: 44 y.o. male   1. Essential hypertension Controlled.  Continue current regimen.  Fill sent - CMP14+EGFR  2. Mixed hyperlipidemia Off statin.  Not fasting this morning.  Check lipid panel, TSH and CMP - Lipid Panel - TSH - CMP14+EGFR  3. Family history of early CAD - Lipid Panel - TSH - CMP14+EGFR  4. Screening for malignant neoplasm of prostate Asymptomatic.  Check PSA - PSA  5. Screening, anemia, deficiency, iron Status post surgery on right knee - CBC  6. Tobacco abuse Cessation recommended   No orders of the defined types were placed in this encounter.  No orders of the defined types were placed in this encounter.    [name], DO [place] [phone]

## 2020-05-02 ENCOUNTER — Ambulatory Visit: Payer: Medicaid Other | Admitting: Family Medicine

## 2020-05-02 ENCOUNTER — Encounter: Payer: Self-pay | Admitting: Family Medicine

## 2020-05-02 ENCOUNTER — Other Ambulatory Visit: Payer: Self-pay

## 2020-05-02 VITALS — BP 135/89 | HR 97 | Temp 98.2°F | Ht 71.0 in | Wt 231.0 lb

## 2020-05-02 DIAGNOSIS — Z130 Encounter for screening for diseases of the blood and blood-forming organs and certain disorders involving the immune mechanism: Secondary | ICD-10-CM | POA: Diagnosis not present

## 2020-05-02 DIAGNOSIS — Z125 Encounter for screening for malignant neoplasm of prostate: Secondary | ICD-10-CM | POA: Diagnosis not present

## 2020-05-02 DIAGNOSIS — I10 Essential (primary) hypertension: Secondary | ICD-10-CM | POA: Diagnosis not present

## 2020-05-02 DIAGNOSIS — Z8249 Family history of ischemic heart disease and other diseases of the circulatory system: Secondary | ICD-10-CM

## 2020-05-02 DIAGNOSIS — E782 Mixed hyperlipidemia: Secondary | ICD-10-CM | POA: Diagnosis not present

## 2020-05-02 DIAGNOSIS — Z720 Tobacco use: Secondary | ICD-10-CM

## 2020-05-02 MED ORDER — AMLODIPINE BESYLATE 10 MG PO TABS: 10.0000 mg | ORAL_TABLET | Freq: Every day | ORAL | 3 refills | Status: DC

## 2020-05-02 NOTE — Patient Instructions (Signed)
You had labs performed today.  You will be contacted with the results of the labs once they are available, usually in the next 3 business days for routine lab work.  If you have an active my chart account, they will be released to your MyChart.  If you prefer to have these labs released to you via telephone, please let us know.  If you had a pap smear or biopsy performed, expect to be contacted in about 7-10 days.   Preventive Care 40-46 Years Old, Male Preventive care refers to lifestyle choices and visits with your health care provider that can promote health and wellness. This includes:  A yearly physical exam. This is also called an annual well check.  Regular dental and eye exams.  Immunizations.  Screening for certain conditions.  Healthy lifestyle choices, such as eating a healthy diet, getting regular exercise, not using drugs or products that contain nicotine and tobacco, and limiting alcohol use. What can I expect for my preventive care visit? Physical exam Your health care provider will check:  Height and weight. These Favia be used to calculate body mass index (BMI), which is a measurement that tells if you are at a healthy weight.  Heart rate and blood pressure.  Your skin for abnormal spots. Counseling Your health care provider Bartz ask you questions about:  Alcohol, tobacco, and drug use.  Emotional well-being.  Home and relationship well-being.  Sexual activity.  Eating habits.  Work and work environment. What immunizations do I need?  Influenza (flu) vaccine  This is recommended every year. Tetanus, diphtheria, and pertussis (Tdap) vaccine  You Busey need a Td booster every 10 years. Varicella (chickenpox) vaccine  You Vigorito need this vaccine if you have not already been vaccinated. Zoster (shingles) vaccine  You Mazzeo need this after age 60. Measles, mumps, and rubella (MMR) vaccine  You Crihfield need at least one dose of MMR if you were born in 1957 or  later. You Westerman also need a second dose. Pneumococcal conjugate (PCV13) vaccine  You Averhart need this if you have certain conditions and were not previously vaccinated. Pneumococcal polysaccharide (PPSV23) vaccine  You Drennon need one or two doses if you smoke cigarettes or if you have certain conditions. Meningococcal conjugate (MenACWY) vaccine  You Champeau need this if you have certain conditions. Hepatitis A vaccine  You Ricciardi need this if you have certain conditions or if you travel or work in places where you Hupp be exposed to hepatitis A. Hepatitis B vaccine  You Penner need this if you have certain conditions or if you travel or work in places where you Fusco be exposed to hepatitis B. Haemophilus influenzae type b (Hib) vaccine  You Hamelin need this if you have certain risk factors. Human papillomavirus (HPV) vaccine  If recommended by your health care provider, you Adell need three doses over 6 months. You Rondon receive vaccines as individual doses or as more than one vaccine together in one shot (combination vaccines). Talk with your health care provider about the risks and benefits of combination vaccines. What tests do I need? Blood tests  Lipid and cholesterol levels. These Mccay be checked every 5 years, or more frequently if you are over 50 years old.  Hepatitis C test.  Hepatitis B test. Screening  Lung cancer screening. You Flores have this screening every year starting at age 55 if you have a 30-pack-year history of smoking and currently smoke or have quit within the past 15 years.    Prostate cancer screening. Recommendations will vary depending on your family history and other risks.  Colorectal cancer screening. All adults should have this screening starting at age 50 and continuing until age 75. Your health care provider Conly recommend screening at age 45 if you are at increased risk. You will have tests every 1-10 years, depending on your results and the type of screening  test.  Diabetes screening. This is done by checking your blood sugar (glucose) after you have not eaten for a while (fasting). You Halbur have this done every 1-3 years.  Sexually transmitted disease (STD) testing. Follow these instructions at home: Eating and drinking  Eat a diet that includes fresh fruits and vegetables, whole grains, lean protein, and low-fat dairy products.  Take vitamin and mineral supplements as recommended by your health care provider.  Do not drink alcohol if your health care provider tells you not to drink.  If you drink alcohol: ? Limit how much you have to 0-2 drinks a day. ? Be aware of how much alcohol is in your drink. In the [place] one drink equals one 12 oz bottle of beer (355 mL), one 5 oz glass of wine (148 mL), or one 1 oz glass of hard liquor (44 mL). Lifestyle  Take daily care of your teeth and gums.  Stay active. Exercise for at least 30 minutes on 5 or more days each week.  Do not use any products that contain nicotine or tobacco, such as cigarettes, e-cigarettes, and chewing tobacco. If you need help quitting, ask your health care provider.  If you are sexually active, practice safe sex. Use a condom or other form of protection to prevent STIs (sexually transmitted infections).  Talk with your health care provider about taking a low-dose aspirin every day starting at age 50. What's next?  Go to your health care provider once a year for a well check visit.  Ask your health care provider how often you should have your eyes and teeth checked.  Stay up to date on all vaccines. This information is not intended to replace advice given to you by your health care provider. Make sure you discuss any questions you have with your health care provider. Document Revised: 08/06/2018 Document Reviewed: 08/06/2018 Elsevier Patient Education  2020 [employer]   

## 2020-05-03 ENCOUNTER — Ambulatory Visit: Payer: Medicaid Other | Admitting: Physical Therapy

## 2020-05-03 ENCOUNTER — Other Ambulatory Visit: Payer: Self-pay | Admitting: Family Medicine

## 2020-05-03 DIAGNOSIS — R600 Localized edema: Secondary | ICD-10-CM

## 2020-05-03 DIAGNOSIS — G8929 Other chronic pain: Secondary | ICD-10-CM

## 2020-05-03 DIAGNOSIS — M25561 Pain in right knee: Secondary | ICD-10-CM | POA: Diagnosis not present

## 2020-05-03 DIAGNOSIS — M6281 Muscle weakness (generalized): Secondary | ICD-10-CM

## 2020-05-03 DIAGNOSIS — M79604 Pain in right leg: Secondary | ICD-10-CM | POA: Diagnosis not present

## 2020-05-03 DIAGNOSIS — M25661 Stiffness of right knee, not elsewhere classified: Secondary | ICD-10-CM | POA: Diagnosis not present

## 2020-05-03 LAB — CMP14+EGFR
ALT: 35 IU/L (ref 0–44)
AST: 26 IU/L (ref 0–40)
Albumin/Globulin Ratio: 1.4 (ref 1.2–2.2)
Albumin: 4.4 g/dL (ref 4.0–5.0)
Alkaline Phosphatase: 130 IU/L — ABNORMAL HIGH (ref 48–121)
BUN/Creatinine Ratio: 11 (ref 9–20)
BUN: 9 mg/dL (ref 6–24)
Bilirubin Total: 0.3 mg/dL (ref 0.0–1.2)
CO2: 26 mmol/L (ref 20–29)
Calcium: 9.3 mg/dL (ref 8.7–10.2)
Chloride: 98 mmol/L (ref 96–106)
Creatinine, Ser: 0.84 mg/dL (ref 0.76–1.27)
GFR calc Af Amer: 122 mL/min/{1.73_m2} (ref >59)
GFR calc non Af Amer: 106 mL/min/{1.73_m2} (ref >59)
Globulin, Total: 3.1 g/dL (ref 1.5–4.5)
Glucose: 81 mg/dL (ref 65–99)
Potassium: 4 mmol/L (ref 3.5–5.2)
Sodium: 138 mmol/L (ref 134–144)
Total Protein: 7.5 g/dL (ref 6.0–8.5)

## 2020-05-03 LAB — LIPID PANEL
Chol/HDL Ratio: 5.8 ratio — ABNORMAL HIGH (ref 0.0–5.0)
Cholesterol, Total: 219 mg/dL — ABNORMAL HIGH (ref 100–199)
HDL: 38 mg/dL — ABNORMAL LOW (ref >39)
LDL Chol Calc (NIH): 151 mg/dL — ABNORMAL HIGH (ref 0–99)
Triglycerides: 162 mg/dL — ABNORMAL HIGH (ref 0–149)
VLDL Cholesterol Cal: 30 mg/dL (ref 5–40)

## 2020-05-03 LAB — CBC
Hematocrit: 43.4 % (ref 37.5–51.0)
Hemoglobin: 14.2 g/dL (ref 13.0–17.7)
MCH: 28.5 pg (ref 26.6–33.0)
MCHC: 32.7 g/dL (ref 31.5–35.7)
MCV: 87 fL (ref 79–97)
Platelets: 319 10*3/uL (ref 150–450)
RBC: 4.98 x10E6/uL (ref 4.14–5.80)
RDW: 12.9 % (ref 11.6–15.4)
WBC: 7 10*3/uL (ref 3.4–10.8)

## 2020-05-03 LAB — PSA: Prostate Specific Ag, Serum: 0.2 ng/mL (ref 0.0–4.0)

## 2020-05-03 LAB — TSH: TSH: 1.6 u[IU]/mL (ref 0.450–4.500)

## 2020-05-03 MED ORDER — ATORVASTATIN CALCIUM 80 MG PO TABS: 80.0000 mg | ORAL_TABLET | Freq: Every day | ORAL | 3 refills | Status: DC

## 2020-05-03 NOTE — Therapy (Addendum)
[facility] [place], [place], [address] Phone: [phone]   Fax:  [phone]  Physical Therapy Treatment  Patient Details  Name: Victor [name] MRN: [id number] Date of Birth: September 08, 1975 Referring Provider (PT): Arther Abbott MD   Encounter Date: 05/03/2020   PT End of Session - 05/03/20 1207    Visit Number 3    Number of Visits 12    Date for PT Re-Evaluation 05/31/20    PT Start Time 1115    PT Stop Time 1152    PT Time Calculation (min) 37 min    Activity Tolerance Patient tolerated treatment well    Behavior During Therapy [facility] for tasks assessed/performed           Past Medical History:  Diagnosis Date  . GERD (gastroesophageal reflux disease)   . History of stomach ulcers   . Hypertension     Past Surgical History:  Procedure Laterality Date  . ANTERIOR CRUCIATE LIGAMENT REPAIR Right 05/19/2018   Procedure: RIGHT KNEE ARTHROSCOPY WITH ANTERIOR CRUCIATE LIGAMENT (ACL) REPAIR and MEDIAL MENISECTOMY;  Surgeon: [name], MD;  Location: [facility];  Service: Orthopedics;  Laterality: Right;  . APPENDECTOMY    . HARDWARE REMOVAL Right 09/21/2019   Procedure: HARDWARE REMOVAL (RICHARD'S STAPLE) RIGHT KNEE;  Surgeon: [name], MD;  Location: [facility];  Service: Orthopedics;  Laterality: Right;  . KNEE ARTHROSCOPY WITH MEDIAL MENISECTOMY Right 05/29/2017   Procedure: KNEE ARTHROSCOPY WITH MEDIAL MENISECTOMY and lateral meniscectomy;  Surgeon: [name], MD;  Location: [facility];  Service: Orthopedics;  Laterality: Right;  . KNEE ARTHROSCOPY WITH MEDIAL MENISECTOMY Right 02/05/2018   Procedure: RIGHT KNEE ARTHROSCOPY WITH PARTIAL MEDIAL AND LATERAL MENISECTOMY;  Surgeon: [name], MD;  Location: [facility];  Service: Orthopedics;  Laterality: Right;  . TOTAL KNEE ARTHROPLASTY Right 03/28/2020   Procedure: TOTAL KNEE ARTHROPLASTY;  Surgeon: [name], MD;  Location: [facility];  Service:  Orthopedics;  Laterality: Right;    There were no vitals filed for this visit.   Subjective Assessment - 05/03/20 1205    Subjective COVID-19 screen performed prior to patient entering clinic.  Continued c/o right knee stiffness.    Pertinent History HTN, previous right knee scope and ACL reconstruction, HTN.    How long can you walk comfortably? Around home with bilateral axillary crutches.    Patient Stated Goals Perform ADL's with less pain.    Currently in Pain? Yes    Pain Score 7     Pain Location Knee    Pain Orientation Right    Pain Descriptors / Indicators Tightness   "Stiff."   Pain Type Surgical pain    Pain Onset 1 to 4 weeks ago                             [facility] Adult PT Treatment/Exercise - 05/03/20 0001      Exercises   Exercises Knee/Hip      Knee/Hip Exercises: Aerobic   Nustep Level 3 x 15 minutes moving seat forward x 2 to increase knee flexion.      Modalities   Modalities Cryotherapy      Cryotherapy   Number Minutes Cryotherapy 10 Minutes    Cryotherapy Location --   Right knee.   Type of Cryotherapy Ice pack      Manual Therapy   Manual Therapy Passive ROM    Passive ROM In  supine:  PROM to patient's right knee into flexion and extension x 9 minutes.                       PT Long Term Goals - 04/19/20 1243      PT LONG TERM GOAL #1   Title Independent with a HEP.    Time 6    Period Weeks    Status New      PT LONG TERM GOAL #2   Title Full active right knee extension in order to normalize gait.    Time 6    Period Weeks    Status New      PT LONG TERM GOAL #3   Title Active right knee flexion to 115 degrees+ so the patient can perform functional tasks and do so with pain not > 2-3/10.    Time 6    Period Weeks    Status New      PT LONG TERM GOAL #4   Title Increase right hip and knee strength to a solid 4+/5 to provide good stability for accomplishment of functional activities.    Time 6     Period Weeks    Status New      PT LONG TERM GOAL #5   Title Decrease edema to within 2.5 cms of non-affected side to assist with pain reduction and range of motion gains.    Time 6    Period Weeks    Status New      PT LONG TERM GOAL #6   Title Perform a reciprocating stair gait with one railing with pain not > 2-3/10.    Time 6    Period Weeks    Status New      PT LONG TERM GOAL #7   Title Walk a community distance without assistive device.    Time 6    Period Weeks    Status New                 Plan - 05/03/20 1208    Clinical Impression Statement Patient did okay today.  Passive range of motion to right knee performed to patient tolerance.  Patient's knee remains stiff and limited into both flexion and extension.  He continues to walk using one axillary crutch.    Personal Factors and Comorbidities Comorbidity 1;Comorbidity 2    Comorbidities HTN, previous right knee scope and ACL reconstruction, HTN.    Examination-Activity Limitations Locomotion Level;Other;Stairs    Examination-Participation Restrictions Other;Yard Work    Stability/Clinical Decision Making Stable/Uncomplicated    Rehab Potential Good    PT Frequency 2x / week    PT Duration 6 weeks    PT Treatment/Interventions ADLs/Self Care Home Management;Neuromuscular re-education;Therapeutic exercise;Therapeutic activities;Functional mobility training;Patient/family education;Manual techniques;Passive range of motion    PT Next Visit Plan Patient's insurance provider does not cover e'stim, CP or vasopnuematic. Progress patient into TKA protocol.  Start with Nustep.  Needs PROM to gain right knee flexion and extension.    Consulted and Agree with Plan of Care Patient           Patient will benefit from skilled therapeutic intervention in order to improve the following deficits and impairments:  Pain, Decreased activity tolerance, Increased edema, Decreased strength, Abnormal gait, Decreased range of  motion  Visit Diagnosis: Chronic pain of right knee  Muscle weakness (generalized)  Localized edema  Stiffness of right knee, not elsewhere classified     Problem List Patient  Active Problem List   Diagnosis Date Noted  . H/O total knee replacement, 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  . S/P hardware removal right knee 09/21/19 10/04/2019  . Essential hypertension 04/07/2019  . Family history of early CAD 04/07/2019  . History of chest pain 03/10/2019  . Hyperlipidemia 03/10/2019  . Tobacco abuse 03/10/2019  . S/P right knee arthroscopy 02/05/18 06/23/2017  . S/P ACL repair right 05/19/18   . Chondromalacia of lateral femoral condyle, right   . Chondromalacia, patella, right     Idona Stach, [place] MPT 05/03/2020, 12:11 PM  [facility] [address] [place], [place], [address] Phone: [phone]   Fax:  [phone]  Name: Victor [name] MRN: [id number] Date of Birth: 10-Aug-1976

## 2020-05-05 ENCOUNTER — Other Ambulatory Visit: Payer: Self-pay

## 2020-05-05 ENCOUNTER — Ambulatory Visit: Payer: Medicaid Other | Admitting: Physical Therapy

## 2020-05-05 ENCOUNTER — Encounter: Payer: Self-pay | Admitting: Physical Therapy

## 2020-05-05 DIAGNOSIS — G8929 Other chronic pain: Secondary | ICD-10-CM | POA: Diagnosis not present

## 2020-05-05 DIAGNOSIS — R600 Localized edema: Secondary | ICD-10-CM | POA: Diagnosis not present

## 2020-05-05 DIAGNOSIS — M6281 Muscle weakness (generalized): Secondary | ICD-10-CM | POA: Diagnosis not present

## 2020-05-05 DIAGNOSIS — M25661 Stiffness of right knee, not elsewhere classified: Secondary | ICD-10-CM | POA: Diagnosis not present

## 2020-05-05 DIAGNOSIS — M79604 Pain in right leg: Secondary | ICD-10-CM | POA: Diagnosis not present

## 2020-05-05 DIAGNOSIS — M25561 Pain in right knee: Secondary | ICD-10-CM | POA: Diagnosis not present

## 2020-05-05 NOTE — Therapy (Signed)
[facility] [place], [place], [address] Phone: [phone]   Fax:  [phone]  Physical Therapy Treatment  Patient Details  Name: Victor [name] MRN: [id number] Date of Birth: 1975-09-03 Referring Provider (PT): Arther Abbott MD   Encounter Date: 05/05/2020   PT End of Session - 05/05/20 1118    Visit Number 4    Number of Visits 12    Date for PT Re-Evaluation 05/31/20    PT Start Time 1116    PT Stop Time 1204    PT Time Calculation (min) 48 min    Activity Tolerance Patient tolerated treatment well    Behavior During Therapy [facility] for tasks assessed/performed           Past Medical History:  Diagnosis Date  . GERD (gastroesophageal reflux disease)   . History of stomach ulcers   . Hypertension     Past Surgical History:  Procedure Laterality Date  . ANTERIOR CRUCIATE LIGAMENT REPAIR Right 05/19/2018   Procedure: RIGHT KNEE ARTHROSCOPY WITH ANTERIOR CRUCIATE LIGAMENT (ACL) REPAIR and MEDIAL MENISECTOMY;  Surgeon: [name], MD;  Location: [facility];  Service: Orthopedics;  Laterality: Right;  . APPENDECTOMY    . HARDWARE REMOVAL Right 09/21/2019   Procedure: HARDWARE REMOVAL (RICHARD'S STAPLE) RIGHT KNEE;  Surgeon: [name], MD;  Location: [facility];  Service: Orthopedics;  Laterality: Right;  . KNEE ARTHROSCOPY WITH MEDIAL MENISECTOMY Right 05/29/2017   Procedure: KNEE ARTHROSCOPY WITH MEDIAL MENISECTOMY and lateral meniscectomy;  Surgeon: [name], MD;  Location: [facility];  Service: Orthopedics;  Laterality: Right;  . KNEE ARTHROSCOPY WITH MEDIAL MENISECTOMY Right 02/05/2018   Procedure: RIGHT KNEE ARTHROSCOPY WITH PARTIAL MEDIAL AND LATERAL MENISECTOMY;  Surgeon: [name], MD;  Location: [facility];  Service: Orthopedics;  Laterality: Right;  . TOTAL KNEE ARTHROPLASTY Right 03/28/2020   Procedure: TOTAL KNEE ARTHROPLASTY;  Surgeon: [name], MD;  Location: [facility];   Service: Orthopedics;  Laterality: Right;    There were no vitals filed for this visit.   Subjective Assessment - 05/05/20 1118    Subjective COVID-19 screen performed prior to patient entering clinic. Reports 6 or 7/10 pain. States he Kouns have slept wrong.    Pertinent History HTN, previous right knee scope and ACL reconstruction, HTN.    How long can you walk comfortably? Around home with bilateral axillary crutches.    Patient Stated Goals Perform ADL's with less pain.    Currently in Pain? Yes    Pain Score 7     Pain Location Knee    Pain Orientation Right    Pain Descriptors / Indicators Tightness    Pain Type Surgical pain    Pain Onset 1 to 4 weeks ago    Pain Frequency Constant              [facility] PT Assessment - 05/05/20 0001      Assessment   Medical Diagnosis S/p total knee replacement, right.    Referring Provider (PT) Arther Abbott MD    Onset Date/Surgical Date 03/28/20    Hand Dominance Right    Next MD Visit 05/10/2020      Precautions   Precaution Comments No ultrasound.      Restrictions   Weight Bearing Restrictions No                         [facility] Adult PT Treatment/Exercise - 05/05/20 0001  Exercises   Exercises Knee/Hip      Knee/Hip Exercises: Aerobic   Nustep Level 3 x 15 minutes moving seat forward from 10 to 8 to improve ROM      Knee/Hip Exercises: Standing   Rocker Board 3 minutes      Knee/Hip Exercises: Seated   Heel Slides AAROM;Right   x3 mins   Other Seated Knee/Hip Exercises knee extension into PT resistance 2x5 5" hold      Modalities   Modalities Cryotherapy      Cryotherapy   Number Minutes Cryotherapy 10 Minutes    Cryotherapy Location Knee    Type of Cryotherapy Ice pack      Manual Therapy   Manual Therapy Passive ROM;Edema management    Edema Management distal to proximal edema massage to R LE to address swelling    Passive ROM PROM to right knee in sitting into flexion and extension to  improve ROM; intermittent oscillations to prevent muscle guarding                       PT Long Term Goals - 04/19/20 1243      PT LONG TERM GOAL #1   Title Independent with a HEP.    Time 6    Period Weeks    Status New      PT LONG TERM GOAL #2   Title Full active right knee extension in order to normalize gait.    Time 6    Period Weeks    Status New      PT LONG TERM GOAL #3   Title Active right knee flexion to 115 degrees+ so the patient can perform functional tasks and do so with pain not > 2-3/10.    Time 6    Period Weeks    Status New      PT LONG TERM GOAL #4   Title Increase right hip and knee strength to a solid 4+/5 to provide good stability for accomplishment of functional activities.    Time 6    Period Weeks    Status New      PT LONG TERM GOAL #5   Title Decrease edema to within 2.5 cms of non-affected side to assist with pain reduction and range of motion gains.    Time 6    Period Weeks    Status New      PT LONG TERM GOAL #6   Title Perform a reciprocating stair gait with one railing with pain not > 2-3/10.    Time 6    Period Weeks    Status New      PT LONG TERM GOAL #7   Title Walk a community distance without assistive device.    Time 6    Period Weeks    Status New                 Plan - 05/05/20 1215    Clinical Impression Statement Patient responded well to therapy session but with pain and stiffness with TEs. Patient guided through exercises and was instructed to keep hip down to prevent hip hiking. Patient guarded with PROM and reports the sensation of his knee is about to "snap" in distal patella to tib tuberosity region. Patient educated on edema control and utilizing compression stockings as he will be driving about 1hr and 20 mins to [place] to attend a concert. Patient instructed to ice and elevate once home as well. Patient reported  understanding. Ice pack applied with LEs elevated for pain relief and edema  control with normal response upon removal.    Personal Factors and Comorbidities Comorbidity 1;Comorbidity 2    Comorbidities HTN, previous right knee scope and ACL reconstruction, HTN.    Examination-Activity Limitations Locomotion Level;Other;Stairs    Examination-Participation Restrictions Other;Yard Work    Stability/Clinical Decision Making Stable/Uncomplicated    Designer, [employer]ell[employer]    Rehab Potential Good    PT Frequency 2x / week    PT Duration 6 weeks    PT Treatment/Interventions ADLs/Self Care Home Management;Neuromuscular re-education;Therapeutic exercise;Therapeutic activities;Functional mobility training;Patient/family education;Manual techniques;Passive range of motion    PT Next Visit Plan Patient's insurance provider does not cover e'stim, CP or vasopnuematic. Progress patient into TKA protocol.  Start with Nustep.  Needs PROM to gain right knee flexion and extension.    Consulted and Agree with Plan of Care Patient           Patient will benefit from skilled therapeutic intervention in order to improve the following deficits and impairments:  Pain, Decreased activity tolerance, Increased edema, Decreased strength, Abnormal gait, Decreased range of motion  Visit Diagnosis: Chronic pain of right knee  Muscle weakness (generalized)  Localized edema     Problem List Patient Active Problem List   Diagnosis Date Noted  . H/O total knee replacement, 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  . S/P hardware removal right knee 09/21/19 10/04/2019  . Essential hypertension 04/07/2019  . Family history of early CAD 04/07/2019  . History of chest pain 03/10/2019  . Hyperlipidemia 03/10/2019  . Tobacco abuse 03/10/2019  . S/P right knee arthroscopy 02/05/18 06/23/2017  . S/P ACL repair right 05/19/18   . Chondromalacia of lateral femoral condyle, right   . Chondromalacia, patella, right     Gabriela Eves, PT, DPT 05/05/2020, 12:22 PM  [facility] [address] [place], [place], [address] Phone: [phone]   Fax:  [phone]  Name: Victor [name] MRN: [id number] Date of Birth: 03/11/1976

## 2020-05-08 ENCOUNTER — Other Ambulatory Visit: Payer: Self-pay | Admitting: Orthopedic Surgery

## 2020-05-08 DIAGNOSIS — Z96651 Presence of right artificial knee joint: Secondary | ICD-10-CM

## 2020-05-08 MED ORDER — HYDROCODONE-ACETAMINOPHEN 10-325 MG PO TABS: 1.0000 | ORAL_TABLET | Freq: Four times a day (QID) | ORAL | 0 refills | Status: DC | PRN

## 2020-05-08 NOTE — Progress Notes (Signed)
Meds ordered this encounter  Medications  . HYDROcodone-acetaminophen (NORCO) 10-325 MG tablet    Sig: Take 1 tablet by mouth every 6 (six) hours as needed for up to 7 days.    Dispense:  28 tablet    Refill:  0

## 2020-05-09 ENCOUNTER — Other Ambulatory Visit: Payer: Self-pay

## 2020-05-09 ENCOUNTER — Ambulatory Visit: Payer: Medicaid Other | Admitting: *Deleted

## 2020-05-09 DIAGNOSIS — M79604 Pain in right leg: Secondary | ICD-10-CM

## 2020-05-09 DIAGNOSIS — G8929 Other chronic pain: Secondary | ICD-10-CM

## 2020-05-09 DIAGNOSIS — M25661 Stiffness of right knee, not elsewhere classified: Secondary | ICD-10-CM

## 2020-05-09 DIAGNOSIS — M6281 Muscle weakness (generalized): Secondary | ICD-10-CM | POA: Diagnosis not present

## 2020-05-09 DIAGNOSIS — R600 Localized edema: Secondary | ICD-10-CM

## 2020-05-09 DIAGNOSIS — M25561 Pain in right knee: Secondary | ICD-10-CM | POA: Diagnosis not present

## 2020-05-09 NOTE — Therapy (Signed)
[facility] [place], [place], [address] Phone: [phone]   Fax:  [phone]  Physical Therapy Treatment  Patient Details  Name: Victor [name] MRN: [id number] Date of Birth: [birth year] Referring Provider (PT): Arther Abbott MD   Encounter Date: 05/09/2020   PT End of Session - 05/09/20 1441    Visit Number 5    Number of Visits 12    Date for PT Re-Evaluation 05/31/20    PT Start Time 1430    PT Stop Time 1525    PT Time Calculation (min) 55 min           Past Medical History:  Diagnosis Date  . GERD (gastroesophageal reflux disease)   . History of stomach ulcers   . Hypertension     Past Surgical History:  Procedure Laterality Date  . ANTERIOR CRUCIATE LIGAMENT REPAIR Right 05/19/2018   Procedure: RIGHT KNEE ARTHROSCOPY WITH ANTERIOR CRUCIATE LIGAMENT (ACL) REPAIR and MEDIAL MENISECTOMY;  Surgeon: [name], MD;  Location: [facility];  Service: Orthopedics;  Laterality: Right;  . APPENDECTOMY    . HARDWARE REMOVAL Right 09/21/2019   Procedure: HARDWARE REMOVAL (RICHARD'S STAPLE) RIGHT KNEE;  Surgeon: [name], MD;  Location: [facility];  Service: Orthopedics;  Laterality: Right;  . KNEE ARTHROSCOPY WITH MEDIAL MENISECTOMY Right 05/29/2017   Procedure: KNEE ARTHROSCOPY WITH MEDIAL MENISECTOMY and lateral meniscectomy;  Surgeon: [name], MD;  Location: [facility];  Service: Orthopedics;  Laterality: Right;  . KNEE ARTHROSCOPY WITH MEDIAL MENISECTOMY Right 02/05/2018   Procedure: RIGHT KNEE ARTHROSCOPY WITH PARTIAL MEDIAL AND LATERAL MENISECTOMY;  Surgeon: [name], MD;  Location: [facility];  Service: Orthopedics;  Laterality: Right;  . TOTAL KNEE ARTHROPLASTY Right 03/28/2020   Procedure: TOTAL KNEE ARTHROPLASTY;  Surgeon: [name], MD;  Location: [facility];  Service: Orthopedics;  Laterality: Right;    There were no vitals filed for this visit.   Subjective Assessment -  05/09/20 1435    Subjective COVID-19 screen performed prior to patient entering clinic. Reports 6/10 pain today. Getting some hand numbness LT side using one crutch. To MD tomorrow    Pertinent History HTN, previous right knee scope and ACL reconstruction, HTN.    How long can you walk comfortably? Around home with bilateral axillary crutches.    Patient Stated Goals Perform ADL's with less pain.    Currently in Pain? Yes    Pain Location Knee    Pain Onset 1 to 4 weeks ago                             [facility] Adult PT Treatment/Exercise - 05/09/20 0001      Exercises   Exercises Knee/Hip      Knee/Hip Exercises: Aerobic   Nustep Level 3 x 15 minutes moving seat forward from 10 to 8 to improve ROM      Knee/Hip Exercises: Standing   Forward Lunges 1 set;Right;10 reps   hold 10 secs   Rocker Board 3 minutes      Knee/Hip Exercises: Seated   Heel Slides AAROM;Right   x3 mins   Other Seated Knee/Hip Exercises --      Modalities   Modalities Cryotherapy      Cryotherapy   Number Minutes Cryotherapy 10 Minutes    Cryotherapy Location Knee    Type of Cryotherapy Ice pack      Manual Therapy   Manual  Therapy Passive ROM;Edema management    Edema Management --    Passive ROM PROM to right knee in supine into flexion and extension to improve ROM; intermittent oscillations to prevent muscle guarding                       PT Long Term Goals - 04/19/20 1243      PT LONG TERM GOAL #1   Title Independent with a HEP.    Time 6    Period Weeks    Status New      PT LONG TERM GOAL #2   Title Full active right knee extension in order to normalize gait.    Time 6    Period Weeks    Status New      PT LONG TERM GOAL #3   Title Active right knee flexion to 115 degrees+ so the patient can perform functional tasks and do so with pain not > 2-3/10.    Time 6    Period Weeks    Status New      PT LONG TERM GOAL #4   Title Increase right hip and knee  strength to a solid 4+/5 to provide good stability for accomplishment of functional activities.    Time 6    Period Weeks    Status New      PT LONG TERM GOAL #5   Title Decrease edema to within 2.5 cms of non-affected side to assist with pain reduction and range of motion gains.    Time 6    Period Weeks    Status New      PT LONG TERM GOAL #6   Title Perform a reciprocating stair gait with one railing with pain not > 2-3/10.    Time 6    Period Weeks    Status New      PT LONG TERM GOAL #7   Title Walk a community distance without assistive device.    Time 6    Period Weeks    Status New                 Plan - 05/09/20 1521    Clinical Impression Statement Pt arrived today doing ok with mainly stiffness and soreness LT knee. Rx focused on increasing ROM for flexion and extension. Pt was guided through exs f/b PROM in supine. PROM today was 20-72 degrees. Normal modality response today    Personal Factors and Comorbidities Comorbidity 1;Comorbidity 2    Comorbidities HTN, previous right knee scope and ACL reconstruction, HTN.    Examination-Activity Limitations Locomotion Level;Other;Stairs    Examination-Participation Restrictions Other;Yard Work    Stability/Clinical Decision Making Stable/Uncomplicated    Rehab Potential Good    PT Frequency 2x / week    PT Duration 6 weeks    PT Treatment/Interventions ADLs/Self Care Home Management;Neuromuscular re-education;Therapeutic exercise;Therapeutic activities;Functional mobility training;Patient/family education;Manual techniques;Passive range of motion    PT Next Visit Plan Patient's insurance provider does not cover e'stim, CP or vasopnuematic. Progress patient into TKA protocol.  Start with Nustep.  Needs PROM to gain right knee flexion and extension.    Consulted and Agree with Plan of Care Patient           Patient will benefit from skilled therapeutic intervention in order to improve the following deficits and  impairments:  Pain, Decreased activity tolerance, Increased edema, Decreased strength, Abnormal gait, Decreased range of motion  Visit Diagnosis: Chronic pain of right knee  Muscle weakness (generalized)  Localized edema  Stiffness of right knee, not elsewhere classified  Pain in right leg     Problem List Patient Active Problem List   Diagnosis Date Noted  . 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  . S/P hardware removal right knee 09/21/19 10/04/2019  . Essential hypertension 04/07/2019  . Family history of early CAD 04/07/2019  . History of chest pain 03/10/2019  . Hyperlipidemia 03/10/2019  . Tobacco abuse 03/10/2019  . S/P right knee arthroscopy 02/05/18 06/23/2017  . S/P ACL repair right 05/19/18   . Chondromalacia of lateral femoral condyle, right   . Chondromalacia, patella, right     Alanee Ting,CHRIS, PTA 05/09/2020, 4:30 PM  [facility] [place], [place], [address] Phone: [phone]   Fax:  [phone]  Name: Victor [name] MRN: [id number] Date of Birth: 1976-07-23

## 2020-05-10 ENCOUNTER — Encounter: Payer: Self-pay | Admitting: Orthopedic Surgery

## 2020-05-10 ENCOUNTER — Ambulatory Visit (INDEPENDENT_AMBULATORY_CARE_PROVIDER_SITE_OTHER): Payer: Medicaid Other | Admitting: Orthopedic Surgery

## 2020-05-10 DIAGNOSIS — Z96651 Presence of right artificial knee joint: Secondary | ICD-10-CM

## 2020-05-10 NOTE — Progress Notes (Addendum)
Chief Complaint  Patient presents with  . Routine Post Op    03/28/20 right knee replacement limited motion   . Medication Management    [employer] medicaid will not cover hydrocodone    Needs prior authorization for hydrocodone  No improvement in ROM   03/29/2019  6 weeks postop  Try manipulation of the knee   ( get pre auth first ) approve the machine and therapy afterwards   Regarding the medication patient is now 6 weeks postop and still requiring frequent amounts of opioid.  We have have established a treatment plan and will continue periodic review of  Medications  Continue therapy   2x a week   Addendum 10:28 AM 05/22/2020  Clinical reason for medication postoperative pain after total knee.  Preop OP opioid tolerance.  Treatment plan is to try to control the patient's pain to 5 or 10 or less using alternative medications to decrease the opioid load  Medications will be reviewed periodically as needed

## 2020-05-10 NOTE — Patient Instructions (Signed)
Wait 1 week for doctors call

## 2020-05-12 ENCOUNTER — Telehealth: Payer: Self-pay | Admitting: Radiology

## 2020-05-12 ENCOUNTER — Encounter: Payer: Medicaid Other | Admitting: Physical Therapy

## 2020-05-12 NOTE — Telephone Encounter (Signed)
Prescribe ?14 days of short-acting opioids for severe pain. Prescribe the lowest effective dose strength. For those exceptional cases that warrant more than 14 days of opioid treatment, the surgeon should re-evaluate the patient before refilling opioids and taper off opioids within 6 weeks after surgery."

## 2020-05-12 NOTE — Telephone Encounter (Signed)
Quick question what is the FDA recommendation

## 2020-05-12 NOTE — Telephone Encounter (Signed)
To get auth for the hydrocodone your note will need to indicate the clinical reason for requesting a dosage frequency greater than the FDA recommendation and a statement that you have established a treatment plan and periodic review of  Medications Can you please add this to your note from yesterday so I can fax?   I put it in my request, but they are asking for doctors notes / office notes.

## 2020-05-15 ENCOUNTER — Other Ambulatory Visit: Payer: Self-pay

## 2020-05-15 ENCOUNTER — Ambulatory Visit: Payer: Medicaid Other | Admitting: Physical Therapy

## 2020-05-15 DIAGNOSIS — M25661 Stiffness of right knee, not elsewhere classified: Secondary | ICD-10-CM

## 2020-05-15 DIAGNOSIS — G8929 Other chronic pain: Secondary | ICD-10-CM | POA: Diagnosis not present

## 2020-05-15 DIAGNOSIS — M6281 Muscle weakness (generalized): Secondary | ICD-10-CM | POA: Diagnosis not present

## 2020-05-15 DIAGNOSIS — R600 Localized edema: Secondary | ICD-10-CM | POA: Diagnosis not present

## 2020-05-15 DIAGNOSIS — M79604 Pain in right leg: Secondary | ICD-10-CM | POA: Diagnosis not present

## 2020-05-15 DIAGNOSIS — M25561 Pain in right knee: Secondary | ICD-10-CM | POA: Diagnosis not present

## 2020-05-15 NOTE — Therapy (Signed)
[facility] [place], [place], [address] Phone: [phone]   Fax:  [phone]  Physical Therapy Treatment  Patient Details  Name: Victor [name] MRN: [id number] Date of Birth: [birth year] Referring Provider (PT): Victor Abbott MD   Encounter Date: 05/15/2020   PT End of Session - 05/15/20 1131    Visit Number 6    Number of Visits 12    Date for PT Re-Evaluation 05/31/20    PT Start Time 1031    PT Stop Time 1113    PT Time Calculation (min) 42 min    Activity Tolerance Patient tolerated treatment well;Patient limited by pain    Behavior During Therapy [facility] for tasks assessed/performed           Past Medical History:  Diagnosis Date  . GERD (gastroesophageal reflux disease)   . History of stomach ulcers   . Hypertension     Past Surgical History:  Procedure Laterality Date  . ANTERIOR CRUCIATE LIGAMENT REPAIR Right 05/19/2018   Procedure: RIGHT KNEE ARTHROSCOPY WITH ANTERIOR CRUCIATE LIGAMENT (ACL) REPAIR and MEDIAL MENISECTOMY;  Surgeon: Victor Civil, MD;  Location: [facility];  Service: Orthopedics;  Laterality: Right;  . APPENDECTOMY    . HARDWARE REMOVAL Right 09/21/2019   Procedure: HARDWARE REMOVAL (Victor [name]) RIGHT KNEE;  Surgeon: Victor Civil, MD;  Location: [facility];  Service: Orthopedics;  Laterality: Right;  . KNEE ARTHROSCOPY WITH MEDIAL MENISECTOMY Right 05/29/2017   Procedure: KNEE ARTHROSCOPY WITH MEDIAL MENISECTOMY and lateral meniscectomy;  Surgeon: Victor Civil, MD;  Location: [facility];  Service: Orthopedics;  Laterality: Right;  . KNEE ARTHROSCOPY WITH MEDIAL MENISECTOMY Right 02/05/2018   Procedure: RIGHT KNEE ARTHROSCOPY WITH PARTIAL MEDIAL AND LATERAL MENISECTOMY;  Surgeon: Victor Civil, MD;  Location: [facility];  Service: Orthopedics;  Laterality: Right;  . TOTAL KNEE ARTHROPLASTY Right 03/28/2020   Procedure: TOTAL KNEE ARTHROPLASTY;  Surgeon: Victor Civil, MD;   Location: [facility];  Service: Orthopedics;  Laterality: Right;    There were no vitals filed for this visit.   Subjective Assessment - 05/15/20 1037    Subjective COVID-19 screen performed prior to patient entering clinic. Patient arrived with 6/10 pain and has to have a manipulation on his knee per MD    Pertinent History HTN, previous right knee scope and ACL reconstruction, HTN.    How long can you walk comfortably? Around home with bilateral axillary crutches.    Patient Stated Goals Perform ADL's with less pain.    Currently in Pain? Yes    Pain Score 6     Pain Location Knee    Pain Orientation Right    Pain Descriptors / Indicators Tightness    Pain Type Surgical pain    Pain Onset More than a month ago    Aggravating Factors  bending knee    Pain Relieving Factors rest              [facility] PT Assessment - 05/15/20 0001      ROM / Strength   AROM / PROM / Strength AROM;PROM      AROM   AROM Assessment Site Ankle    Right/Left Knee Left    Right Knee Flexion 70    Left Knee Extension -20      PROM   PROM Assessment Site Knee    Right/Left Knee Right    Right Knee Extension 75    Right Knee Flexion -16  [facility] Adult PT Treatment/Exercise - 05/15/20 0001      Knee/Hip Exercises: Aerobic   Nustep L4 x23min adjusted for ROM      Knee/Hip Exercises: Supine   Other Supine Knee/Hip Exercises supine wall slides with holds x7min      Manual Therapy   Manual Therapy Soft tissue mobilization;Passive ROM    Soft tissue mobilization manual STW to ant posterior knee with stretching due to pain in muscles    Passive ROM PROM in supine and sitting for knee flexion and ext with holds to improve mobility                       PT Long Term Goals - 05/15/20 1151      PT LONG TERM GOAL #1   Title Independent with a HEP.    Time 6    Period Weeks    Status On-going      PT LONG TERM GOAL #2   Title Full active right knee  extension in order to normalize gait.    Baseline AROM -20 degrees 05/15/20    Time 6    Period Weeks    Status On-going      PT LONG TERM GOAL #3   Title Active right knee flexion to 115 degrees+ so the patient can perform functional tasks and do so with pain not > 2-3/10.    Baseline AROM 70 degrees 05/15/20    Time 6    Period Weeks    Status On-going      PT LONG TERM GOAL #4   Title Increase right hip and knee strength to a solid 4+/5 to provide good stability for accomplishment of functional activities.    Time 6    Period Weeks    Status On-going      PT LONG TERM GOAL #5   Title Decrease edema to within 2.5 cms of non-affected side to assist with pain reduction and range of motion gains.    Time 6    Period Weeks    Status On-going      PT LONG TERM GOAL #6   Title Perform a reciprocating stair gait with one railing with pain not > 2-3/10.    Time 6    Period Weeks    Status On-going      PT LONG TERM GOAL #7   Title Walk a community distance without assistive device.    Time 6    Period Weeks    Status On-going                 Plan - 05/15/20 1153    Clinical Impression Statement Patient tolerated treatment fair due to pain and limitations. Patient arrived and reported he will have to have a manipulation and awaiting MD to set up appt. Today focused on ROM and STW to right knee to decrease pain and increase mobility. Patient very limited due to pain. Educated patient on different self stretches and in different positions to help maintain mobility. Patient unable to perform strengthening exercises today due to pain. Patient goals ongoing at this time.    Personal Factors and Comorbidities Comorbidity 1;Comorbidity 2    Comorbidities HTN, previous right knee scope and ACL reconstruction, HTN.    Examination-Activity Limitations Locomotion Level;Other;Stairs    Examination-Participation Restrictions Other;Yard Work    Stability/Clinical Decision Making  Stable/Uncomplicated    Rehab Potential Good    PT Frequency 2x / week    PT  Duration 6 weeks    PT Treatment/Interventions ADLs/Self Care Home Management;Neuromuscular re-education;Therapeutic exercise;Therapeutic activities;Functional mobility training;Patient/family education;Manual techniques;Passive range of motion    PT Next Visit Plan Patient Victor [name] be put on hold pending manipulation awaiting call. Patient's insurance provider does not cover e'stim, CP or vasopnuematic. Focus on PROM to gain right knee flexion and extension.    Consulted and Agree with Plan of Care Patient           Patient will benefit from skilled therapeutic intervention in order to improve the following deficits and impairments:  Pain, Decreased activity tolerance, Increased edema, Decreased strength, Abnormal gait, Decreased range of motion  Visit Diagnosis: Chronic pain of right knee  Muscle weakness (generalized)  Localized edema  Stiffness of right knee, not elsewhere classified     Problem List Patient Active Problem List   Diagnosis Date Noted  . 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  . S/P hardware removal right knee 09/21/19 10/04/2019  . Essential hypertension 04/07/2019  . Family history of early CAD 04/07/2019  . History of chest pain 03/10/2019  . Hyperlipidemia 03/10/2019  . Tobacco abuse 03/10/2019  . S/P right knee arthroscopy 02/05/18 06/23/2017  . S/P ACL repair right 05/19/18   . Chondromalacia of lateral femoral condyle, right   . Chondromalacia, patella, right     Dorsey Charette P, PTA 05/15/2020, 11:57 AM  [facility] [place], [place], [address] Phone: [phone]   Fax:  [phone]  Name: Victor [name] MRN: [id number] Date of Birth: 05-21-1976

## 2020-05-16 NOTE — Telephone Encounter (Signed)
Coverage of Hydrocodone has been denied Lack of documentation There is appeal information if you want to do an appeal

## 2020-05-17 ENCOUNTER — Telehealth: Payer: Self-pay | Admitting: Orthopedic Surgery

## 2020-05-17 NOTE — Telephone Encounter (Signed)
Victor [name] called and wanted to know if we knew anything about an approval for the new manipulation  Please call him   Thanks

## 2020-05-19 ENCOUNTER — Ambulatory Visit: Payer: Medicaid Other | Admitting: *Deleted

## 2020-05-19 NOTE — Telephone Encounter (Signed)
Patient left a message Friday, 05/19/20, again to inquire about approval for the manipulation procedure.  I called patient to relay that I will forward the message so that we [place] call him Monday when Dr Aline Brochure and clinic staff return to office.

## 2020-05-22 ENCOUNTER — Telehealth: Payer: Self-pay | Admitting: Orthopedic Surgery

## 2020-05-22 NOTE — Telephone Encounter (Signed)
I am not sure/ Dr Aline Brochure did not tell me to post it, so I sent him this message, then you sent it back to me.  Maybe you should send it back to Dr Aline Brochure.

## 2020-05-22 NOTE — Telephone Encounter (Signed)
Patient's wife called and said they have some questions regarding Anquan's pain medication and the manipulation.  They ask that you give them a call at [phone]  Thanks

## 2020-05-24 ENCOUNTER — Telehealth: Payer: Self-pay | Admitting: Orthopedic Surgery

## 2020-05-24 ENCOUNTER — Other Ambulatory Visit: Payer: Self-pay | Admitting: Orthopedic Surgery

## 2020-05-24 DIAGNOSIS — Z96651 Presence of right artificial knee joint: Secondary | ICD-10-CM

## 2020-05-24 MED ORDER — HYDROCODONE-ACETAMINOPHEN 10-325 MG PO TABS: 1.0000 | ORAL_TABLET | Freq: Four times a day (QID) | ORAL | 0 refills | Status: DC | PRN

## 2020-05-24 NOTE — Telephone Encounter (Signed)
Ok have put him on the list for the surgery after restrictions lifted  Called wife to discuss   He needs refill on Hydrocodone states they will pay for the Rx

## 2020-05-24 NOTE — Progress Notes (Signed)
Re ordered

## 2020-05-24 NOTE — Telephone Encounter (Signed)
No   He will need inpatient surgery due to need for pain management and the cpm issue afetr manipul.  I added the addendum   Back to therapy for now    Addendum 10:28 AM 05/22/2020   Clinical reason for medication postoperative pain after total knee.  Preop OP opioid tolerance.   Treatment plan is to try to control the patient's pain to 5 or 10 or less using alternative medications to decrease the opioid load   Medications will be reviewed periodically as needed

## 2020-05-24 NOTE — Telephone Encounter (Addendum)
I spoke to his wife to advise [facility] will not cover the Hydrocodone  She states they are still waiting for me to call him back about the manipulation, I advised I do not know anything about scheduling  He has not been back to physical therapy since he was here last, they d/c wanting manipulation done.  Can you advise? Your note states precert, I can precert the surgery but Medicaid does not cover CPM after manipulation and his physical therapy is limited too.  Do you want me to get approval for manipulation? Can not use CPM after.

## 2020-05-24 NOTE — Telephone Encounter (Signed)
Pt's wife asks that you call her.  She said Medicaid was to get with Dr. Aline Brochure regarding a note as to why he needs to be taking pain medication.  Not sure how or if this note was to be sent to Dr. Aline Brochure.   We have not seen any note from [facility] for him faxed to the office.  Pt's wife asked if you will call her [phone]  Thanks so much

## 2020-05-25 ENCOUNTER — Telehealth: Payer: Self-pay | Admitting: Physical Therapy

## 2020-05-25 NOTE — Telephone Encounter (Signed)
PLEASE CALL PATIENT AND DISCUSS THE NUMBER OF VISITS HE HAS LEFT FOR [employer]. HE HAS NOT HAD HIS MANIPULATON YET DUE TO COVID - COULB BE SCHEDULE AT ANYTIME. CAN HE GET MOVE VISITS AFTER THE MANIPULATION OR SHOULD HE HOLD ON TO THE 4 OR SO HE HAS LEFT FROM THE FIRST AUTHORIZATION. HE CAN BE REACHED AT [phone]. THANKS NANCY

## 2020-05-26 NOTE — Telephone Encounter (Signed)
We can not do the manipulation until we can admit patients again

## 2020-05-26 NOTE — Telephone Encounter (Signed)
Noted. Call to patient (Victor [name] have already been called).

## 2020-05-29 ENCOUNTER — Other Ambulatory Visit: Payer: Self-pay

## 2020-05-29 ENCOUNTER — Encounter: Payer: Self-pay | Admitting: Physical Therapy

## 2020-05-29 ENCOUNTER — Ambulatory Visit: Payer: Medicaid Other | Attending: Orthopedic Surgery | Admitting: Physical Therapy

## 2020-05-29 DIAGNOSIS — M6281 Muscle weakness (generalized): Secondary | ICD-10-CM | POA: Diagnosis not present

## 2020-05-29 DIAGNOSIS — M25661 Stiffness of right knee, not elsewhere classified: Secondary | ICD-10-CM | POA: Diagnosis not present

## 2020-05-29 DIAGNOSIS — R600 Localized edema: Secondary | ICD-10-CM | POA: Diagnosis not present

## 2020-05-29 DIAGNOSIS — M25561 Pain in right knee: Secondary | ICD-10-CM | POA: Insufficient documentation

## 2020-05-29 DIAGNOSIS — G8929 Other chronic pain: Secondary | ICD-10-CM | POA: Diagnosis not present

## 2020-05-29 NOTE — Therapy (Signed)
[facility] [place], [place], [address] Phone: [phone]   Fax:  [phone]  Physical Therapy Treatment  Patient Details  Name: Bodhi Moradi Coyne MRN: [id number] Date of Birth: 1975-10-10 Referring Provider (PT): Arther Abbott MD   Encounter Date: 05/29/2020   PT End of Session - 05/29/20 1400    Visit Number 7    Number of Visits 12    Date for PT Re-Evaluation 05/31/20    PT Start Time 7824    PT Stop Time 1430    PT Time Calculation (min) 41 min    Activity Tolerance Patient limited by pain    Behavior During Therapy [facility] for tasks assessed/performed           Past Medical History:  Diagnosis Date  . GERD (gastroesophageal reflux disease)   . History of stomach ulcers   . Hypertension     Past Surgical History:  Procedure Laterality Date  . ANTERIOR CRUCIATE LIGAMENT REPAIR Right 05/19/2018   Procedure: RIGHT KNEE ARTHROSCOPY WITH ANTERIOR CRUCIATE LIGAMENT (ACL) REPAIR and MEDIAL MENISECTOMY;  Surgeon: [name], MD;  Location: [facility];  Service: Orthopedics;  Laterality: Right;  . APPENDECTOMY    . HARDWARE REMOVAL Right 09/21/2019   Procedure: HARDWARE REMOVAL (RICHARD'S STAPLE) RIGHT KNEE;  Surgeon: [name], MD;  Location: [facility];  Service: Orthopedics;  Laterality: Right;  . KNEE ARTHROSCOPY WITH MEDIAL MENISECTOMY Right 05/29/2017   Procedure: KNEE ARTHROSCOPY WITH MEDIAL MENISECTOMY and lateral meniscectomy;  Surgeon: [name], MD;  Location: [facility];  Service: Orthopedics;  Laterality: Right;  . KNEE ARTHROSCOPY WITH MEDIAL MENISECTOMY Right 02/05/2018   Procedure: RIGHT KNEE ARTHROSCOPY WITH PARTIAL MEDIAL AND LATERAL MENISECTOMY;  Surgeon: [name], MD;  Location: [facility];  Service: Orthopedics;  Laterality: Right;  . TOTAL KNEE ARTHROPLASTY Right 03/28/2020   Procedure: TOTAL KNEE ARTHROPLASTY;  Surgeon: [name], MD;  Location: [facility];  Service:  Orthopedics;  Laterality: Right;    There were no vitals filed for this visit.   Subjective Assessment - 05/29/20 1359    Subjective COVID-19 screen performed prior to patient entering clinic. Patient still waiting on manipulation at this time.    Pertinent History HTN, previous right knee scope and ACL reconstruction, HTN.    How long can you walk comfortably? Around home with bilateral axillary crutches.    Patient Stated Goals Perform ADL's with less pain.    Currently in Pain? Yes    Pain Score 6     Pain Location Knee    Pain Orientation Right    Pain Descriptors / Indicators Tightness;Discomfort    Pain Type Surgical pain    Pain Onset More than a month ago    Pain Frequency Constant              [facility] PT Assessment - 05/29/20 0001      Assessment   Medical Diagnosis S/p total knee replacement, right.    Referring Provider (PT) Arther Abbott MD    Onset Date/Surgical Date 03/28/20    Hand Dominance Right    Next MD Visit TBD/manipulation      Precautions   Precaution Comments No ultrasound.      Restrictions   Weight Bearing Restrictions No      ROM / Strength   AROM / PROM / Strength AROM      AROM   Overall AROM  Deficits    AROM Assessment Site Knee  Right/Left Knee Right    Right Knee Flexion 80    Left Knee Extension -18                         [facility] Adult PT Treatment/Exercise - 05/29/20 0001      Knee/Hip Exercises: Aerobic   Recumbent Bike seat 11 x5 min    Nustep L4, seat17 x16 min      Knee/Hip Exercises: Standing   Forward Lunges Right;20 reps;5 seconds    Forward Lunges Limitations 8" step      Knee/Hip Exercises: Seated   Other Seated Knee/Hip Exercises Knee glider into flex in sitting      Knee/Hip Exercises: Supine   Heel Slides AROM;Right;15 reps    Heel Slides Limitations on wall                       PT Long Term Goals - 05/15/20 1151      PT LONG TERM GOAL #1   Title Independent with a  HEP.    Time 6    Period Weeks    Status On-going      PT LONG TERM GOAL #2   Title Full active right knee extension in order to normalize gait.    Baseline AROM -20 degrees 05/15/20    Time 6    Period Weeks    Status On-going      PT LONG TERM GOAL #3   Title Active right knee flexion to 115 degrees+ so the patient can perform functional tasks and do so with pain not > 2-3/10.    Baseline AROM 70 degrees 05/15/20    Time 6    Period Weeks    Status On-going      PT LONG TERM GOAL #4   Title Increase right hip and knee strength to a solid 4+/5 to provide good stability for accomplishment of functional activities.    Time 6    Period Weeks    Status On-going      PT LONG TERM GOAL #5   Title Decrease edema to within 2.5 cms of non-affected side to assist with pain reduction and range of motion gains.    Time 6    Period Weeks    Status On-going      PT LONG TERM GOAL #6   Title Perform a reciprocating stair gait with one railing with pain not > 2-3/10.    Time 6    Period Weeks    Status On-going      PT LONG TERM GOAL #7   Title Walk a community distance without assistive device.    Time 6    Period Weeks    Status On-going                 Plan - 05/29/20 1542    Clinical Impression Statement Patient progressed to more therex for ROM with VCs to hold at end range for stretching. Patient reports limitation with stairs in his home enviroment due to lack of functional ROM. Patient limited by attempts at PROM and was discontinued due to pain. AROM of R knee has improved minimally since last PT visit to 18-80 deg. Patient encouraged to continue his HEP and icing until manipulation is scheduled or next PT visit.    Personal Factors and Comorbidities Comorbidity 1;Comorbidity 2    Comorbidities HTN, previous right knee scope and ACL reconstruction, HTN.    Examination-Activity Limitations  Locomotion Level;Other;Stairs    Examination-Participation Restrictions  Other;Yard Work    Stability/Clinical Decision Making Stable/Uncomplicated    Rehab Potential Good    PT Frequency 2x / week    PT Duration 6 weeks    PT Treatment/Interventions ADLs/Self Care Home Management;Neuromuscular re-education;Therapeutic exercise;Therapeutic activities;Functional mobility training;Patient/family education;Manual techniques;Passive range of motion    PT Next Visit Plan Patient's insurance provider does not cover e'stim, CP or vasopnuematic. Focus on PROM to gain right knee flexion and extension.    Consulted and Agree with Plan of Care Patient           Patient will benefit from skilled therapeutic intervention in order to improve the following deficits and impairments:  Pain, Decreased activity tolerance, Increased edema, Decreased strength, Abnormal gait, Decreased range of motion  Visit Diagnosis: Chronic pain of right knee  Muscle weakness (generalized)  Localized edema  Stiffness of right knee, not elsewhere classified     Problem List Patient Active Problem List   Diagnosis Date Noted  . 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  . S/P hardware removal right knee 09/21/19 10/04/2019  . Essential hypertension 04/07/2019  . Family history of early CAD 04/07/2019  . History of chest pain 03/10/2019  . Hyperlipidemia 03/10/2019  . Tobacco abuse 03/10/2019  . S/P right knee arthroscopy 02/05/18 06/23/2017  . S/P ACL repair right 05/19/18   . Chondromalacia of lateral femoral condyle, right   . Chondromalacia, patella, right     Standley Brooking, PTA 05/29/2020, 3:47 PM  [facility] [address] [place], [place], [address] Phone: [phone]   Fax:  [phone]  Name: Jeremie Giangrande Leggio MRN: [id number] Date of Birth: 10-05-1975

## 2020-05-31 DIAGNOSIS — Z96651 Presence of right artificial knee joint: Secondary | ICD-10-CM

## 2020-06-01 MED ORDER — HYDROCODONE-ACETAMINOPHEN 10-325 MG PO TABS: 1.0000 | ORAL_TABLET | Freq: Four times a day (QID) | ORAL | 0 refills | Status: DC | PRN

## 2020-06-05 ENCOUNTER — Ambulatory Visit: Payer: Medicaid Other | Admitting: Physical Therapy

## 2020-06-05 ENCOUNTER — Other Ambulatory Visit: Payer: Self-pay

## 2020-06-05 ENCOUNTER — Encounter: Payer: Self-pay | Admitting: Physical Therapy

## 2020-06-05 DIAGNOSIS — M25561 Pain in right knee: Secondary | ICD-10-CM | POA: Diagnosis not present

## 2020-06-05 DIAGNOSIS — M25661 Stiffness of right knee, not elsewhere classified: Secondary | ICD-10-CM

## 2020-06-05 DIAGNOSIS — M6281 Muscle weakness (generalized): Secondary | ICD-10-CM | POA: Diagnosis not present

## 2020-06-05 DIAGNOSIS — G8929 Other chronic pain: Secondary | ICD-10-CM

## 2020-06-05 DIAGNOSIS — R600 Localized edema: Secondary | ICD-10-CM | POA: Diagnosis not present

## 2020-06-05 NOTE — Therapy (Signed)
[facility] [place], [place], [address] Phone: [phone]   Fax:  [phone]  Physical Therapy Treatment  Patient Details  Name: Victor [name] MRN: [id number] Date of Birth: [birth year] Referring Provider (PT): Arther Abbott MD   Encounter Date: 06/05/2020   PT End of Session - 06/05/20 1403    Visit Number 8    Number of Visits 12    Date for PT Re-Evaluation 05/31/20    PT Start Time 5638    PT Stop Time 1428    PT Time Calculation (min) 40 min    Activity Tolerance Patient limited by pain    Behavior During Therapy [facility] for tasks assessed/performed           Past Medical History:  Diagnosis Date  . GERD (gastroesophageal reflux disease)   . History of stomach ulcers   . Hypertension     Past Surgical History:  Procedure Laterality Date  . ANTERIOR CRUCIATE LIGAMENT REPAIR Right 05/19/2018   Procedure: RIGHT KNEE ARTHROSCOPY WITH ANTERIOR CRUCIATE LIGAMENT (ACL) REPAIR and MEDIAL MENISECTOMY;  Surgeon: [name], MD;  Location: [facility];  Service: Orthopedics;  Laterality: Right;  . APPENDECTOMY    . HARDWARE REMOVAL Right 09/21/2019   Procedure: HARDWARE REMOVAL (RICHARD'S STAPLE) RIGHT KNEE;  Surgeon: [name], MD;  Location: [facility];  Service: Orthopedics;  Laterality: Right;  . KNEE ARTHROSCOPY WITH MEDIAL MENISECTOMY Right 05/29/2017   Procedure: KNEE ARTHROSCOPY WITH MEDIAL MENISECTOMY and lateral meniscectomy;  Surgeon: [name], MD;  Location: [facility];  Service: Orthopedics;  Laterality: Right;  . KNEE ARTHROSCOPY WITH MEDIAL MENISECTOMY Right 02/05/2018   Procedure: RIGHT KNEE ARTHROSCOPY WITH PARTIAL MEDIAL AND LATERAL MENISECTOMY;  Surgeon: [name], MD;  Location: [facility];  Service: Orthopedics;  Laterality: Right;  . TOTAL KNEE ARTHROPLASTY Right 03/28/2020   Procedure: TOTAL KNEE ARTHROPLASTY;  Surgeon: [name], MD;  Location: [facility];  Service:  Orthopedics;  Laterality: Right;    There were no vitals filed for this visit.   Subjective Assessment - 06/05/20 1354    Subjective COVID-19 screen performed prior to patient entering clinic. Patient still waiting on manipulation at this time. Reports the last few days that he had more distal femur discomfort at R knee when flat foot during gait. Less pain with gait on toes.    Pertinent History HTN, previous right knee scope and ACL reconstruction, HTN.    Patient Stated Goals Perform ADL's with less pain.    Currently in Pain? Yes    Pain Score 6     Pain Location Knee    Pain Orientation Right    Pain Descriptors / Indicators Discomfort    Pain Type Surgical pain    Pain Onset More than a month ago    Pain Frequency Constant              [facility] PT Assessment - 06/05/20 0001      Assessment   Medical Diagnosis S/p total knee replacement, right.    Referring Provider (PT) Arther Abbott MD    Onset Date/Surgical Date 03/28/20    Hand Dominance Right    Next MD Visit TBD/manipulation      Precautions   Precaution Comments No ultrasound.      Restrictions   Weight Bearing Restrictions No                         [facility]  Adult PT Treatment/Exercise - 06/05/20 0001      Knee/Hip Exercises: Stretches   Passive Hamstring Stretch Right;5 reps;10 seconds    Passive Hamstring Stretch Limitations off 8" step      Knee/Hip Exercises: Aerobic   Recumbent Bike seat 7 x5 min   partial revolutions   Nustep L4, seat 9 x16 min      Knee/Hip Exercises: Standing   Forward Lunges Right;20 reps;5 seconds    Forward Lunges Limitations 8" step    Rocker Board 3 minutes      Knee/Hip Exercises: Supine   Heel Slides AROM;Right;20 reps    Heel Slides Limitations on wall      Manual Therapy   Manual Therapy Myofascial release    Myofascial Release IASTW to R distal quad, incision to reduce tightness                       PT Long Term Goals - 05/15/20  1151      PT LONG TERM GOAL #1   Title Independent with a HEP.    Time 6    Period Weeks    Status On-going      PT LONG TERM GOAL #2   Title Full active right knee extension in order to normalize gait.    Baseline AROM -20 degrees 05/15/20    Time 6    Period Weeks    Status On-going      PT LONG TERM GOAL #3   Title Active right knee flexion to 115 degrees+ so the patient can perform functional tasks and do so with pain not > 2-3/10.    Baseline AROM 70 degrees 05/15/20    Time 6    Period Weeks    Status On-going      PT LONG TERM GOAL #4   Title Increase right hip and knee strength to a solid 4+/5 to provide good stability for accomplishment of functional activities.    Time 6    Period Weeks    Status On-going      PT LONG TERM GOAL #5   Title Decrease edema to within 2.5 cms of non-affected side to assist with pain reduction and range of motion gains.    Time 6    Period Weeks    Status On-going      PT LONG TERM GOAL #6   Title Perform a reciprocating stair gait with one railing with pain not > 2-3/10.    Time 6    Period Weeks    Status On-going      PT LONG TERM GOAL #7   Title Walk a community distance without assistive device.    Time 6    Period Weeks    Status On-going                 Plan - 06/05/20 1442    Clinical Impression Statement Patient presented in clinic with reports of continued tightness and ROM limitation of R knee ROM. Patient still experiencing difficulty with full R knee flexion and extension. Patient still very sensitive to IASTW to distal quad and incision with moderate redness response. Patient continues to experience edema of R knee as well although he is diligent with icing per patient report.    Personal Factors and Comorbidities Comorbidity 1;Comorbidity 2    Comorbidities HTN, previous right knee scope and ACL reconstruction, HTN.    Examination-Activity Limitations Locomotion Level;Other;Stairs     Examination-Participation Restrictions Other;[employer] Work  Stability/Clinical Decision Making Stable/Uncomplicated    Rehab Potential Good    PT Frequency 2x / week    PT Duration 6 weeks    PT Treatment/Interventions ADLs/Self Care Home Management;Neuromuscular re-education;Therapeutic exercise;Therapeutic activities;Functional mobility training;Patient/family education;Manual techniques;Passive range of motion    PT Next Visit Plan Patient's insurance provider does not cover e'stim, CP or vasopnuematic. Focus on PROM to gain right knee flexion and extension.    Consulted and Agree with Plan of Care Patient           Patient will benefit from skilled therapeutic intervention in order to improve the following deficits and impairments:  Pain, Decreased activity tolerance, Increased edema, Decreased strength, Abnormal gait, Decreased range of motion  Visit Diagnosis: Chronic pain of right knee  Muscle weakness (generalized)  Localized edema  Stiffness of right knee, not elsewhere classified     Problem List Patient Active Problem List   Diagnosis Date Noted  . 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  . S/P hardware removal right knee 09/21/19 10/04/2019  . Essential hypertension 04/07/2019  . Family history of early CAD 04/07/2019  . History of chest pain 03/10/2019  . Hyperlipidemia 03/10/2019  . Tobacco abuse 03/10/2019  . S/P right knee arthroscopy 02/05/18 06/23/2017  . S/P ACL repair right 05/19/18   . Chondromalacia of lateral femoral condyle, right   . Chondromalacia, patella, right     Standley Brooking, PTA 06/05/2020, 3:05 PM  [facility] [address] [place], [place], [address] Phone: [phone]   Fax:  [phone]  Name: Victor [name] MRN: [id number] Date of Birth: 1975-08-30

## 2020-06-06 NOTE — Telephone Encounter (Signed)
Patient wife called and states Tellis is in excruciating pain.  He wants to move forward with surgery.  Please call patient.

## 2020-06-06 NOTE — Telephone Encounter (Signed)
I will check and see when we can do it Dr Aline Brochure indicated he will have to try to keep him overnight  All overnight cases are still on hold, but should be opening soon  Have you found out when we can put him down for the surgery ?  Angie please make him appointment to discuss the surgery and recheck the knee since he is getting worse.

## 2020-06-08 ENCOUNTER — Ambulatory Visit (INDEPENDENT_AMBULATORY_CARE_PROVIDER_SITE_OTHER): Payer: Medicaid Other | Admitting: Orthopedic Surgery

## 2020-06-08 ENCOUNTER — Encounter: Payer: Self-pay | Admitting: Orthopedic Surgery

## 2020-06-08 ENCOUNTER — Other Ambulatory Visit: Payer: Self-pay

## 2020-06-08 ENCOUNTER — Ambulatory Visit: Payer: Medicaid Other

## 2020-06-08 DIAGNOSIS — Z96651 Presence of right artificial knee joint: Secondary | ICD-10-CM

## 2020-06-08 DIAGNOSIS — M25561 Pain in right knee: Secondary | ICD-10-CM | POA: Diagnosis not present

## 2020-06-08 DIAGNOSIS — G8929 Other chronic pain: Secondary | ICD-10-CM

## 2020-06-08 DIAGNOSIS — M24661 Ankylosis, right knee: Secondary | ICD-10-CM

## 2020-06-08 MED ORDER — HYDROCODONE-ACETAMINOPHEN 10-325 MG PO TABS: 1.0000 | ORAL_TABLET | Freq: Four times a day (QID) | ORAL | 0 refills | Status: AC | PRN

## 2020-06-08 NOTE — Patient Instructions (Signed)
DO HOME THERAPY   TAKE  PAIN MEDS AS NEEDED   [facility] TRY TO REDO THE SURGERY

## 2020-06-08 NOTE — Progress Notes (Signed)
Chief Complaint  Patient presents with  . Routine Post Op    04/07/20 right knee replacement 04/07/20 / has increase pain decrease motion     I was afraid of this and he knows we talked about this before surgery   He went from 2-100 at DC to basically 20-30   He co more pain less motion and inability to WB  Outwardly there are no signs of infection   xrays no abnormalities seen  Check Labs for infx  Encounter Diagnoses  Name Primary?  . S/P total knee replacement, right Yes  . 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 Fibrosis of right knee joint    Meds ordered this encounter  Medications  . HYDROcodone-acetaminophen (NORCO) 10-325 MG tablet    Sig: Take 1 tablet by mouth every 6 (six) hours as needed for up to 7 days.    Dispense:  28 tablet    Refill:  0   PLAN  I WOULD STOP FORMAL THERAPY AND DO A REVISION WHEN HOSPITAL OPENS UP AGAIN

## 2020-06-09 LAB — CBC WITH DIFFERENTIAL/PLATELET
Absolute Monocytes: 628 cells/uL (ref 200–950)
Basophils Absolute: 60 cells/uL (ref 0–200)
Basophils Relative: 0.7 %
Eosinophils Absolute: 77 cells/uL (ref 15–500)
Eosinophils Relative: 0.9 %
HCT: 46.1 % (ref 38.5–50.0)
Hemoglobin: 15.5 g/dL (ref 13.2–17.1)
Lymphs Abs: 2838 cells/uL (ref 850–3900)
MCH: 28.8 pg (ref 27.0–33.0)
MCHC: 33.6 g/dL (ref 32.0–36.0)
MCV: 85.5 fL (ref 80.0–100.0)
MPV: 9.8 fL (ref 7.5–12.5)
Monocytes Relative: 7.3 %
Neutro Abs: 4997 cells/uL (ref 1500–7800)
Neutrophils Relative %: 58.1 %
Platelets: 363 10*3/uL (ref 140–400)
RBC: 5.39 10*6/uL (ref 4.20–5.80)
RDW: 13 % (ref 11.0–15.0)
Total Lymphocyte: 33 %
WBC: 8.6 10*3/uL (ref 3.8–10.8)

## 2020-06-09 LAB — C-REACTIVE PROTEIN: CRP: 7.7 mg/L (ref <8.0)

## 2020-06-09 LAB — SEDIMENTATION RATE: Sed Rate: 9 mm/h (ref 0–15)

## 2020-06-12 MED ORDER — TRAMADOL HCL 50 MG PO TABS: 50.0000 mg | ORAL_TABLET | Freq: Four times a day (QID) | ORAL | 1 refills | Status: DC | PRN

## 2020-06-12 MED ORDER — METHOCARBAMOL 500 MG PO TABS: 500.0000 mg | ORAL_TABLET | Freq: Four times a day (QID) | ORAL | 1 refills | Status: DC | PRN

## 2020-06-12 NOTE — Addendum Note (Signed)
Addended byCandice Camp on: 06/12/2020 10:37 AM   Modules accepted: Orders

## 2020-06-14 ENCOUNTER — Encounter: Payer: Self-pay | Admitting: Family Medicine

## 2020-06-14 ENCOUNTER — Ambulatory Visit (INDEPENDENT_AMBULATORY_CARE_PROVIDER_SITE_OTHER): Payer: Medicaid Other | Admitting: Family Medicine

## 2020-06-14 DIAGNOSIS — K047 Periapical abscess without sinus: Secondary | ICD-10-CM | POA: Diagnosis not present

## 2020-06-14 MED ORDER — AMOXICILLIN-POT CLAVULANATE 875-125 MG PO TABS: 1.0000 | ORAL_TABLET | Freq: Two times a day (BID) | ORAL | 0 refills | Status: DC

## 2020-06-14 NOTE — Progress Notes (Signed)
Virtual Visit via telephone Note  I connected with Victor [name] on 06/14/20 at 1713 by telephone and verified that I am speaking with the correct person using two identifiers. Victor [name] is currently located at home and patient are currently with her during visit. The provider, Fransisca Kaufmann Audris Speaker, MD is located in their office at time of visit.  Call ended at 1718  I discussed the limitations, risks, security and privacy concerns of performing an evaluation and management service by telephone and the availability of in person appointments. I also discussed with the patient that there Delo be a patient responsible charge related to this service. The patient expressed understanding and agreed to proceed.   History and Present Illness: Patient is calling in for abscessed tooth and pain on the right top molar and been swollen for 1 week.  He is taking hydrocodone for knee so it is helping pain. Patient has a dental appt in 2 weeks.   No diagnosis found.  Outpatient Encounter Medications as of 06/14/2020  Medication Sig  . amLODipine (NORVASC) 10 MG tablet Take 1 tablet (10 mg total) by mouth daily.  Marland Kitchen aspirin 81 MG chewable tablet Chew 1 tablet (81 mg total) by mouth 2 (two) times daily.  Marland Kitchen atorvastatin (LIPITOR) 80 MG tablet Take 1 tablet (80 mg total) by mouth daily.  Marland Kitchen HYDROcodone-acetaminophen (NORCO) 10-325 MG tablet Take 1 tablet by mouth every 6 (six) hours as needed for up to 7 days.  . methocarbamol (ROBAXIN) 500 MG tablet Take 1 tablet (500 mg total) by mouth every 6 (six) hours as needed for muscle spasms.  . ondansetron (ZOFRAN) 4 MG tablet Take 1 tablet (4 mg total) by mouth every 8 (eight) hours as needed for nausea or vomiting.  . traMADol (ULTRAM) 50 MG tablet Take 1 tablet (50 mg total) by mouth every 6 (six) hours as needed.   No facility-administered encounter medications on file as of 06/14/2020.    Review of Systems  Constitutional: Negative for chills and fever.   HENT: Positive for facial swelling.   Respiratory: Negative for shortness of breath and wheezing.   Cardiovascular: Negative for chest pain and leg swelling.  Musculoskeletal: Negative for back pain and gait problem.  Skin: Negative for rash.  All other systems reviewed and are negative.   Observations/Objective: Patient sounds comfortable and in no acute distress.   Assessment and Plan: Problem List Items Addressed This Visit    None    Visit Diagnoses    Dental abscess    -  Primary   Relevant Medications   amoxicillin-clavulanate (AUGMENTIN) 875-125 MG tablet       Follow up plan: Return if symptoms worsen or fail to improve.     I discussed the assessment and treatment plan with the patient. The patient was provided an opportunity to ask questions and all were answered. The patient agreed with the plan and demonstrated an understanding of the instructions.   The patient was advised to call back or seek an in-person evaluation if the symptoms worsen or if the condition fails to improve as anticipated.  The above assessment and management plan was discussed with the patient. The patient verbalized understanding of and has agreed to the management plan. Patient is aware to call the clinic if symptoms persist or worsen. Patient is aware when to return to the clinic for a follow-up visit. Patient educated on when it is appropriate to go to the emergency department.  I provided 5 minutes of non-face-to-face time during this encounter.    Worthy Rancher, MD

## 2020-06-19 MED ORDER — HYDROCODONE-ACETAMINOPHEN 10-325 MG PO TABS: 1.0000 | ORAL_TABLET | Freq: Four times a day (QID) | ORAL | 0 refills | Status: DC | PRN

## 2020-06-19 NOTE — Addendum Note (Signed)
Addended byCandice Camp on: 06/19/2020 08:48 AM   Modules accepted: Orders

## 2020-06-26 MED ORDER — HYDROCODONE-ACETAMINOPHEN 10-325 MG PO TABS: 1.0000 | ORAL_TABLET | Freq: Four times a day (QID) | ORAL | 0 refills | Status: DC | PRN

## 2020-06-26 NOTE — Addendum Note (Signed)
Addended byCandice Camp on: 06/26/2020 03:54 PM   Modules accepted: Orders

## 2020-06-28 ENCOUNTER — Telehealth: Payer: Self-pay | Admitting: Orthopedic Surgery

## 2020-06-28 NOTE — Telephone Encounter (Signed)
Sent mychart message

## 2020-06-28 NOTE — Telephone Encounter (Signed)
Patient and his wife were wondering if there have been updates regarding lifting restrictions so that Victor [name] can get his knee surgery.  Please call them   Thanks

## 2020-07-04 ENCOUNTER — Other Ambulatory Visit: Payer: Self-pay | Admitting: Orthopedic Surgery

## 2020-07-04 MED ORDER — HYDROCODONE-ACETAMINOPHEN 10-325 MG PO TABS: 1.0000 | ORAL_TABLET | Freq: Four times a day (QID) | ORAL | 0 refills | Status: DC | PRN

## 2020-07-04 NOTE — Addendum Note (Signed)
Addended byCandice Camp on: 07/04/2020 08:04 AM   Modules accepted: Orders

## 2020-07-04 NOTE — Progress Notes (Signed)
Meds ordered this encounter  ?Medications  ? HYDROcodone-acetaminophen (NORCO) 10-325 MG tablet  ?  Sig: Take 1 tablet by mouth every 6 (six) hours as needed.  ?  Dispense:  30 tablet  ?  Refill:  0  ? ? ?

## 2020-07-05 ENCOUNTER — Other Ambulatory Visit: Payer: Self-pay | Admitting: Orthopedic Surgery

## 2020-07-05 ENCOUNTER — Encounter: Payer: Self-pay | Admitting: Family Medicine

## 2020-07-05 MED ORDER — HYDROCODONE-ACETAMINOPHEN 10-325 MG PO TABS: 1.0000 | ORAL_TABLET | Freq: Four times a day (QID) | ORAL | 0 refills | Status: DC | PRN

## 2020-07-09 ENCOUNTER — Other Ambulatory Visit: Payer: Self-pay | Admitting: Family Medicine

## 2020-07-11 ENCOUNTER — Other Ambulatory Visit: Payer: Self-pay | Admitting: Orthopedic Surgery

## 2020-07-11 DIAGNOSIS — Z96651 Presence of right artificial knee joint: Secondary | ICD-10-CM

## 2020-07-11 MED ORDER — HYDROCODONE-ACETAMINOPHEN 10-325 MG PO TABS: 1.0000 | ORAL_TABLET | Freq: Four times a day (QID) | ORAL | 0 refills | Status: DC | PRN

## 2020-07-11 NOTE — Addendum Note (Signed)
Addended byCandice Camp on: 07/11/2020 07:59 AM   Modules accepted: Orders

## 2020-07-11 NOTE — Progress Notes (Signed)
Meds ordered this encounter  ?Medications  ? HYDROcodone-acetaminophen (NORCO) 10-325 MG tablet  ?  Sig: Take 1 tablet by mouth every 6 (six) hours as needed.  ?  Dispense:  30 tablet  ?  Refill:  0  ? ? ?

## 2020-07-24 ENCOUNTER — Other Ambulatory Visit: Payer: Self-pay

## 2020-07-24 ENCOUNTER — Encounter: Payer: Self-pay | Admitting: Orthopedic Surgery

## 2020-07-24 ENCOUNTER — Other Ambulatory Visit: Payer: Self-pay | Admitting: Orthopedic Surgery

## 2020-07-24 ENCOUNTER — Ambulatory Visit (INDEPENDENT_AMBULATORY_CARE_PROVIDER_SITE_OTHER): Payer: Medicaid Other | Admitting: Orthopedic Surgery

## 2020-07-24 VITALS — BP 149/94 | HR 92 | Ht 71.0 in

## 2020-07-24 DIAGNOSIS — Z96651 Presence of right artificial knee joint: Secondary | ICD-10-CM | POA: Diagnosis not present

## 2020-07-24 DIAGNOSIS — G8929 Other chronic pain: Secondary | ICD-10-CM | POA: Diagnosis not present

## 2020-07-24 DIAGNOSIS — M25561 Pain in right knee: Secondary | ICD-10-CM | POA: Diagnosis not present

## 2020-07-24 MED ORDER — HYDROCODONE-ACETAMINOPHEN 10-325 MG PO TABS: 1.0000 | ORAL_TABLET | Freq: Four times a day (QID) | ORAL | 0 refills | Status: DC | PRN

## 2020-07-24 NOTE — Progress Notes (Signed)
Chief Complaint  Patient presents with  . Knee Pain    right knee, DOS 03/28/2020. pt reports feeling worse.    On 10 mg norco for pain   Victor [name] comes in for follow-up with increasing pain in his right knee.  I had him marked out a pain diagram from his left leg and it shows a radicular pattern as well as pain around the knee joint itself.  As you know he has arthrofibrosis of the knee is basically stuck at 60 degrees despite leaving the hospital at 2-100 degrees range of motion  We have been holding off on his surgery to try to get an admission for 2 days at the hospital depending on staffing  Note patient did not have good response to gabapentin it made him ill in terms of his overall mental health making him nervous and feel funny  As far as his functional status goes at this time he requires a cane for all walking he has weakness in his right leg and he stumbles at times he cannot squat or kneel or walk more than 50 yards without severe pain  He is not able to lift anything using his legs it is hard for him to go up and down stairs not able to negotiate more than 2-3 at a time in either direction  He also has lumbar disc disease as noted on MRI he has radicular pain on the right leg as noted on the diagram which is located in the media  He has chronic pain and needs more surgery to try to correct his arthrofibrosis and knee flexion extension contracture  He does complain of intermittent hot flashes at night but no fever  His measured range of motion is 40 to 60 degrees The knee looks swollen  MRI   03/01/20  Lumbar MRI  L3-L4: Disc desiccation and mild disc space loss. Circumferential disc bulge eccentric to the left with mild endplate spurring. No spinal or lateral recess stenosis. Mild to moderate left and minimal right L3 foraminal stenosis.  L4-L5: Mild foraminal disc bulging and endplate spurring mostly on the left. No spinal or lateral recess stenosis. Mild  symmetric bilateral L4 foraminal stenosis.  L5-S1: Subtle left subarticular disc bulge or protrusion. Mild endplate spurring and facet hypertrophy. Disc material is in proximity to the descending left S1 nerve root in the lateral recess (series 6, image 42). There is no spinal stenosis. Symmetric appearing borderline to mild bilateral L5 foraminal stenosis.  S1-S2: Vestigial disc, otherwise negative.  Labs will be ordered   CBC ESR CRP    

## 2020-07-24 NOTE — Patient Instructions (Signed)
Post op appt to be determined after surgery date  Chiropractor: Dr Gypsy Decant

## 2020-07-25 LAB — CBC WITH DIFFERENTIAL/PLATELET
Absolute Monocytes: 731 cells/uL (ref 200–950)
Basophils Absolute: 70 cells/uL (ref 0–200)
Basophils Relative: 0.8 %
Eosinophils Absolute: 44 cells/uL (ref 15–500)
Eosinophils Relative: 0.5 %
HCT: 45.9 % (ref 38.5–50.0)
Hemoglobin: 15.5 g/dL (ref 13.2–17.1)
Lymphs Abs: 2636 cells/uL (ref 850–3900)
MCH: 28.6 pg (ref 27.0–33.0)
MCHC: 33.8 g/dL (ref 32.0–36.0)
MCV: 84.7 fL (ref 80.0–100.0)
MPV: 9.9 fL (ref 7.5–12.5)
Monocytes Relative: 8.4 %
Neutro Abs: 5220 cells/uL (ref 1500–7800)
Neutrophils Relative %: 60 %
Platelets: 320 10*3/uL (ref 140–400)
RBC: 5.42 10*6/uL (ref 4.20–5.80)
RDW: 14 % (ref 11.0–15.0)
Total Lymphocyte: 30.3 %
WBC: 8.7 10*3/uL (ref 3.8–10.8)

## 2020-07-25 LAB — C-REACTIVE PROTEIN: CRP: 3.9 mg/L (ref <8.0)

## 2020-07-25 LAB — SEDIMENTATION RATE: Sed Rate: 6 mm/h (ref 0–15)

## 2020-07-26 ENCOUNTER — Telehealth: Payer: Self-pay | Admitting: Radiology

## 2020-07-26 NOTE — Telephone Encounter (Signed)
Submitted the authorization request for the surgery Total knee revision pending review [id number]

## 2020-07-27 ENCOUNTER — Other Ambulatory Visit: Payer: Self-pay | Admitting: Orthopedic Surgery

## 2020-07-27 DIAGNOSIS — M24661 Ankylosis, right knee: Secondary | ICD-10-CM

## 2020-07-27 DIAGNOSIS — Z96651 Presence of right artificial knee joint: Secondary | ICD-10-CM

## 2020-07-28 NOTE — Patient Instructions (Signed)
Victor [name]  07/28/2020     @PREFPERIOPPHARMACY @   Your procedure is scheduled on  08/08/2020.  Report to [facility] at  [phone]  A.M.  Call this number if you have problems the morning of surgery:  [phone]   Remember:  Do not eat or drink after midnight.                          Take these medicines the morning of surgery with A SIP OF WATER  Amlodipine, hydrocodone or tramadol (if needed), robaxin(if needed).    Do not wear jewelry, make-up or nail polish.  Do not wear lotions, powders, or perfumes. Please wear deodorant and brush your teeth.  Do not shave 48 hours prior to surgery.  Men Juhasz shave face and neck.  Do not bring valuables to the hospital.  [facility] is not responsible for any belongings or valuables.  Contacts, dentures or bridgework Macaraeg not be worn into surgery.  Leave your suitcase in the car.  After surgery it Chinn be brought to your room.  For patients admitted to the hospital, discharge time will be determined by your treatment team.  Patients discharged the day of surgery will not be allowed to drive home.   Name and phone number of your driver:   family Special instructions:  DO NOT smoke the morning of your procedure.  Please read over the following fact sheets that you were given. Pain Booklet, Coughing and Deep Breathing, Blood Transfusion Information, Lab Information, Surgical Site Infection Prevention, Anesthesia Post-op Instructions and Care and Recovery After Surgery       Total Knee Replacement, Care After This sheet gives you information about how to care for yourself after your procedure. Your health care provider Mayeda also give you more specific instructions. If you have problems or questions, contact your health care provider. What can I expect after the procedure? After the procedure, it is common to have:  Pain.  Swelling.  A small amount of blood or clear fluid coming from your incision.  Limited range of  motion. Follow these instructions at home: Medicines  Take over-the-counter and prescription medicines only as told by your health care provider.  If you were prescribed a blood thinner (anticoagulant), take it as told by your health care provider.  Ask your health care provider if the medicine prescribed to you: ? Requires you to avoid driving or using heavy machinery. ? Can cause constipation. You Schroepfer need to take actions to prevent or treat constipation, such as:  Drink enough fluid to keep your urine pale yellow.  Take over-the-counter or prescription medicines.  Eat foods that are high in fiber, such as beans, whole grains, and fresh fruits and vegetables.  Limit foods that are high in fat and processed sugars, such as fried or sweet foods. Bathing  Do not take baths, swim, or use a hot tub until your health care provider approves. Ask your health care provider if you Kil[place] take showers. You Wesche only be allowed to take sponge baths.  Keep your bandage (dressing) dry until your health care provider says it can be removed. Incision care and drain care   Follow instructions from your health care provider about how to take care of your incision. Make sure you: ? Wash your hands with soap and water before and after you change your dressing. If soap and water are not  available, use hand sanitizer. ? Change your dressing as told by your health care provider. ? Leave stitches (sutures), skin glue, or adhesive strips in place. These skin closures Crate need to stay in place for 2 weeks or longer. If adhesive strip edges start to loosen and curl up, you Auletta trim the loose edges. Do not remove adhesive strips completely unless your health care provider tells you to do that.  Check your incision area and drain site every day for signs of infection. Check for: ? More redness, swelling, or pain. ? More fluid or blood. ? Warmth. ? Pus or a bad smell.  If you have a drain, follow instructions  from your health care provider about caring for it. Managing pain, stiffness, and swelling      If directed, put ice on your knee. ? Put ice in a plastic bag or use the icing device (cold flow pad or cryocuff) that you were given. Follow instructions from your health care provider about how to use the icing device. ? Place a towel between your skin and the bag or between your skin and the icing device. ? Leave the ice on for 20 minutes, 2-3 times per day.  If directed, apply heat to the affected area before you exercise. Use the heat source that your health care provider recommends, such as a moist heat pack or a heating pad. ? Place a towel between your skin and the heat source. ? Leave the heat on for 20-30 minutes. ? Remove the heat if your skin turns bright red. This is especially important if you are unable to feel pain, heat, or cold. You Campas have a greater risk of getting burned.  Move your toes often to avoid stiffness and to lessen swelling.  Raise (elevate) your leg above the level of your heart while you are sitting or lying down. ? Use several pillows to keep your leg straight. ? Do not put a pillow just under the knee. If the knee is bent for a long time, this Mutschler lead to stiffness.  Wear elastic knee support as told by your health care provider. Activity  Rest as told by your health care provider.  Avoid sitting for a long time without moving. Get up to take short walks every 1-2 hours. This is important to improve blood flow and breathing. Ask for help if you feel weak or unsteady.  Ask your health care provider what activities are safe for you.  Avoid high-impact activities, including running, jumping rope, and jumping jacks.  Do not play contact sports until your health care provider approves.  Do exercises as told by your physical therapist.  If you have been sent home with a continuous passive motion machine, use it as told by your health care  provider. Safety   Do not use your leg to support your body weight until your health care provider approves. Use crutches or a walker as told by your health care provider.  Do not drive until your health care provider approves. Ask your health care provider when it is safe to drive. General instructions  Do not use any products that contain nicotine or tobacco, such as cigarettes, e-cigarettes, and chewing tobacco. These can delay healing after surgery. If you need help quitting, ask your health care provider.  Wear compression stockings as told by your health care provider.  Tell your health care provider if you plan to have dental work. Also: ? Tell your dentist about your joint replacement. ?  Ask your health care provider if there are any special instructions you need to follow before having dental care and routine cleanings.  Keep all follow-up visits as told by your health care provider. This is important. Contact a health care provider if you have:  More redness, swelling, or pain around your incision or drain.  More fluid or blood coming from your incision or drain.  Pus or a bad smell coming from your incision or drain.  Warmth on your incision or drain site.  A fever.  An incision that breaks open.  Knee pain that does not go away.  Range of motion in your knee that is getting worse.  A prosthesis that feels loose. Get help right away if you have:  Pain or swelling in your calf or thigh.  Shortness of breath or difficulty breathing.  Chest pain. Summary  After the procedure, it is common to have pain and swelling, blood or fluid coming from your incision, and limited range of motion.  Follow instructions from your health care provider about how to take care of your incision.  Use crutches or a walker as told by your health care provider.  If you were prescribed a blood thinner (anticoagulant), take it as told by your health care provider.  Keep all  follow-up visits as told by your health care provider. This is important. This information is not intended to replace advice given to you by your health care provider. Make sure you discuss any questions you have with your health care provider. Document Revised: 12/21/2018 Document Reviewed: 03/26/2018 Elsevier Patient Education  [place].  Spinal Anesthesia and Epidural Anesthesia, Care After This sheet gives you information about how to care for yourself after your procedure. Your doctor Abernethy also give you more specific instructions. If you have problems or questions, call your doctor. Follow these instructions at home: For at least 24 hours after the procedure:   Have a responsible adult stay with you. It is important to have someone help care for you until you are awake and alert.  Rest as needed.  Do not do activities where you could fall or get hurt (injured).  Do not drive.  Do not use heavy machinery.  Do not drink alcohol.  Do not take sleeping pills or medicines that make you sleepy (drowsy).  Do not make important decisions.  Do not sign legal documents.  Do not take care of children on your own. Eating and drinking  If you throw up (vomit), drink water, juice, or soup when nausea and vomiting stop.  Drink enough fluid to keep your pee (urine) pale yellow.  Make sure you do not feel like throwing up (nauseous) before you eat solid foods.  Follow the diet that your doctor recommends. General instructions  Return to your normal activities as told by your doctor. Ask your doctor what activities are safe for you.  Take over-the-counter and prescription medicines only as told by your doctor.  If you have sleep apnea, surgery and certain medicines can raise your risk for breathing problems. Follow instructions from your doctor about when to wear your sleep device. Your doctor Corallo tell you to wear your sleep device: ? Anytime you are sleeping, including  during daytime naps. ? While taking prescription pain medicines, sleeping pills, or medicines that make you sleepy.  Do not use any products that contain nicotine or tobacco. This includes cigarettes and e-cigarettes. ? If you need help quitting, ask your doctor. ? If  you smoke, do not smoke by yourself. Make sure someone is nearby in case you need help.  Keep all follow-up visits as told by your doctor. This is important. Contact a doctor if:  It has been more than one day since your procedure and you feel like throwing up.  It has been more than one day since your procedure and you throw up.  You have a rash. Get help right away if:  You have a fever.  You have a headache that lasts a long time.  You have a very bad headache.  Your vision is blurry.  You see two of a single object (double vision).  You are dizzy or light-headed.  You faint.  Your arms or legs tingle, feel weak, or get numb.  You have trouble breathing.  You cannot pee (urinate). Summary  After the procedure, have a responsible adult stay with you at home until you are fully awake and alert.  Do not do activities that might get you injured. Do not drive, use heavy machinery, drink alcohol, or make important decisions for 24 hours after the procedure.  Take medicines as told by your doctor. Do not use products that contain nicotine or tobacco.  Get help right away if you have a fever, blurry vision, difficulty breathing or passing urine, or weakness or numbness in arms or legs. This information is not intended to replace advice given to you by your health care provider. Make sure you discuss any questions you have with your health care provider. Document Revised: 07/25/2017 Document Reviewed: 12/04/2015 Elsevier Patient Education  2020 [place] Anesthesia, Adult, Care After This sheet gives you information about how to care for yourself after your procedure. Your health care provider  Warnell also give you more specific instructions. If you have problems or questions, contact your health care provider. What can I expect after the procedure? After the procedure, the following side effects are common:  Pain or discomfort at the IV site.  Nausea.  Vomiting.  Sore throat.  Trouble concentrating.  Feeling cold or chills.  Weak or tired.  Sleepiness and fatigue.  Soreness and body aches. These side effects can affect parts of the body that were not involved in surgery. Follow these instructions at home:  For at least 24 hours after the procedure:  Have a responsible adult stay with you. It is important to have someone help care for you until you are awake and alert.  Rest as needed.  Do not: ? Participate in activities in which you could fall or become injured. ? Drive. ? Use heavy machinery. ? Drink alcohol. ? Take sleeping pills or medicines that cause drowsiness. ? Make important decisions or sign legal documents. ? Take care of children on your own. Eating and drinking  Follow any instructions from your health care provider about eating or drinking restrictions.  When you feel hungry, start by eating small amounts of foods that are soft and easy to digest (bland), such as toast. Gradually return to your regular diet.  Drink enough fluid to keep your urine pale yellow.  If you vomit, rehydrate by drinking water, juice, or clear broth. General instructions  If you have sleep apnea, surgery and certain medicines can increase your risk for breathing problems. Follow instructions from your health care provider about wearing your sleep device: ? Anytime you are sleeping, including during daytime naps. ? While taking prescription pain medicines, sleeping medicines, or medicines that make you drowsy.  Return to your normal activities as told by your health care provider. Ask your health care provider what activities are safe for you.  Take over-the-counter  and prescription medicines only as told by your health care provider.  If you smoke, do not smoke without supervision.  Keep all follow-up visits as told by your health care provider. This is important. Contact a health care provider if:  You have nausea or vomiting that does not get better with medicine.  You cannot eat or drink without vomiting.  You have pain that does not get better with medicine.  You are unable to pass urine.  You develop a skin rash.  You have a fever.  You have redness around your IV site that gets worse. Get help right away if:  You have difficulty breathing.  You have chest pain.  You have blood in your urine or stool, or you vomit blood. Summary  After the procedure, it is common to have a sore throat or nausea. It is also common to feel tired.  Have a responsible adult stay with you for the first 24 hours after general anesthesia. It is important to have someone help care for you until you are awake and alert.  When you feel hungry, start by eating small amounts of foods that are soft and easy to digest (bland), such as toast. Gradually return to your regular diet.  Drink enough fluid to keep your urine pale yellow.  Return to your normal activities as told by your health care provider. Ask your health care provider what activities are safe for you. This information is not intended to replace advice given to you by your health care provider. Make sure you discuss any questions you have with your health care provider. Document Revised: 08/15/2017 Document Reviewed: 03/28/2017 Elsevier Patient Education  [place]. How to Use Chlorhexidine for Bathing Chlorhexidine gluconate (CHG) is a germ-killing (antiseptic) solution that is used to clean the skin. It can get rid of the bacteria that normally live on the skin and can keep them away for about 24 hours. To clean your skin with CHG, you Maffett be given:  A CHG solution to use in the shower  or as part of a sponge bath.  A prepackaged cloth that contains CHG. Cleaning your skin with CHG Cerasoli help lower the risk for infection:  While you are staying in the intensive care unit of the hospital.  If you have a vascular access, such as a central line, to provide short-term or long-term access to your veins.  If you have a catheter to drain urine from your bladder.  If you are on a ventilator. A ventilator is a machine that helps you breathe by moving air in and out of your lungs.  After surgery. What are the risks? Risks of using CHG include:  A skin reaction.  Hearing loss, if CHG gets in your ears.  Eye injury, if CHG gets in your eyes and is not rinsed out.  The CHG product catching fire. Make sure that you avoid smoking and flames after applying CHG to your skin. Do not use CHG:  If you have a chlorhexidine allergy or have previously reacted to chlorhexidine.  On babies younger than 85 months of age. How to use CHG solution  Use CHG only as told by your health care provider, and follow the instructions on the label.  Use the full amount of CHG as directed. Usually, this is one bottle. During a  shower Follow these steps when using CHG solution during a shower (unless your health care provider gives you different instructions): 1. Start the shower. 2. Use your normal soap and shampoo to wash your face and hair. 3. Turn off the shower or move out of the shower stream. 4. Pour the CHG onto a clean washcloth. Do not use any type of brush or rough-edged sponge. 5. Starting at your neck, lather your body down to your toes. Make sure you follow these instructions: ? If you will be having surgery, pay special attention to the part of your body where you will be having surgery. Scrub this area for at least 1 minute. ? Do not use CHG on your head or face. If the solution gets into your ears or eyes, rinse them well with water. ? Avoid your genital area. ? Avoid any areas  of skin that have broken skin, cuts, or scrapes. ? Scrub your back and under your arms. Make sure to wash skin folds. 6. Let the lather sit on your skin for 1-2 minutes or as long as told by your health care provider. 7. Thoroughly rinse your entire body in the shower. Make sure that all body creases and crevices are rinsed well. 8. Dry off with a clean towel. Do not put any substances on your body afterward--such as powder, lotion, or perfume--unless you are told to do so by your health care provider. Only use lotions that are recommended by the manufacturer. 9. Put on clean clothes or pajamas. 10. If it is the night before your surgery, sleep in clean sheets.  During a sponge bath Follow these steps when using CHG solution during a sponge bath (unless your health care provider gives you different instructions): 1. Use your normal soap and shampoo to wash your face and hair. 2. Pour the CHG onto a clean washcloth. 3. Starting at your neck, lather your body down to your toes. Make sure you follow these instructions: ? If you will be having surgery, pay special attention to the part of your body where you will be having surgery. Scrub this area for at least 1 minute. ? Do not use CHG on your head or face. If the solution gets into your ears or eyes, rinse them well with water. ? Avoid your genital area. ? Avoid any areas of skin that have broken skin, cuts, or scrapes. ? Scrub your back and under your arms. Make sure to wash skin folds. 4. Let the lather sit on your skin for 1-2 minutes or as long as told by your health care provider. 5. Using a different clean, wet washcloth, thoroughly rinse your entire body. Make sure that all body creases and crevices are rinsed well. 6. Dry off with a clean towel. Do not put any substances on your body afterward--such as powder, lotion, or perfume--unless you are told to do so by your health care provider. Only use lotions that are recommended by the  manufacturer. 7. Put on clean clothes or pajamas. 8. If it is the night before your surgery, sleep in clean sheets. How to use CHG prepackaged cloths  Only use CHG cloths as told by your health care provider, and follow the instructions on the label.  Use the CHG cloth on clean, dry skin.  Do not use the CHG cloth on your head or face unless your health care provider tells you to.  When washing with the CHG cloth: ? Avoid your genital area. ? Avoid any areas  of skin that have broken skin, cuts, or scrapes. Before surgery Follow these steps when using a CHG cloth to clean before surgery (unless your health care provider gives you different instructions): 1. Using the CHG cloth, vigorously scrub the part of your body where you will be having surgery. Scrub using a back-and-forth motion for 3 minutes. The area on your body should be completely wet with CHG when you are done scrubbing. 2. Do not rinse. Discard the cloth and let the area air-dry. Do not put any substances on the area afterward, such as powder, lotion, or perfume. 3. Put on clean clothes or pajamas. 4. If it is the night before your surgery, sleep in clean sheets.  For general bathing Follow these steps when using CHG cloths for general bathing (unless your health care provider gives you different instructions). 1. Use a separate CHG cloth for each area of your body. Make sure you wash between any folds of skin and between your fingers and toes. Wash your body in the following order, switching to a new cloth after each step: ? The front of your neck, shoulders, and chest. ? Both of your arms, under your arms, and your hands. ? Your stomach and groin area, avoiding the genitals. ? Your right leg and foot. ? Your left leg and foot. ? The back of your neck, your back, and your buttocks. 2. Do not rinse. Discard the cloth and let the area air-dry. Do not put any substances on your body afterward--such as powder, lotion, or  perfume--unless you are told to do so by your health care provider. Only use lotions that are recommended by the manufacturer. 3. Put on clean clothes or pajamas. Contact a health care provider if:  Your skin gets irritated after scrubbing.  You have questions about using your solution or cloth. Get help right away if:  Your eyes become very red or swollen.  Your eyes itch badly.  Your skin itches badly and is red or swollen.  Your hearing changes.  You have trouble seeing.  You have swelling or tingling in your mouth or throat.  You have trouble breathing.  You swallow any chlorhexidine. Summary  Chlorhexidine gluconate (CHG) is a germ-killing (antiseptic) solution that is used to clean the skin. Cleaning your skin with CHG Makris help to lower your risk for infection.  You Doescher be given CHG to use for bathing. It Dotzler be in a bottle or in a prepackaged cloth to use on your skin. Carefully follow your health care provider's instructions and the instructions on the product label.  Do not use CHG if you have a chlorhexidine allergy.  Contact your health care provider if your skin gets irritated after scrubbing. This information is not intended to replace advice given to you by your health care provider. Make sure you discuss any questions you have with your health care provider. Document Revised: 10/29/2018 Document Reviewed: 07/10/2017 Elsevier Patient Education  [place].

## 2020-07-29 ENCOUNTER — Other Ambulatory Visit: Payer: Self-pay | Admitting: Orthopedic Surgery

## 2020-07-29 ENCOUNTER — Other Ambulatory Visit: Payer: Self-pay | Admitting: Physician Assistant

## 2020-07-29 DIAGNOSIS — G8929 Other chronic pain: Secondary | ICD-10-CM

## 2020-07-29 DIAGNOSIS — M1711 Unilateral primary osteoarthritis, right knee: Secondary | ICD-10-CM

## 2020-07-31 NOTE — Telephone Encounter (Signed)
This is a [place] pt, Dr. McDowell 

## 2020-08-01 ENCOUNTER — Other Ambulatory Visit: Payer: Self-pay | Admitting: Orthopedic Surgery

## 2020-08-01 MED ORDER — HYDROCODONE-ACETAMINOPHEN 5-325 MG PO TABS
1.0000 | ORAL_TABLET | Freq: Four times a day (QID) | ORAL | 0 refills | Status: DC | PRN
Start: 2020-08-01 — End: 2020-08-08

## 2020-08-01 NOTE — Progress Notes (Signed)
NORCO

## 2020-08-01 NOTE — Telephone Encounter (Signed)
Yes but dose decreased to help with post op pain

## 2020-08-02 ENCOUNTER — Encounter (HOSPITAL_COMMUNITY)
Admission: RE | Admit: 2020-08-02 | Discharge: 2020-08-02 | Disposition: A | Discharge status: Home / Self Care | Payer: Medicaid Other | Source: Ambulatory Visit | Attending: Orthopedic Surgery | Admitting: Orthopedic Surgery

## 2020-08-02 ENCOUNTER — Other Ambulatory Visit: Payer: Self-pay

## 2020-08-02 ENCOUNTER — Encounter (HOSPITAL_COMMUNITY): Payer: Self-pay

## 2020-08-02 DIAGNOSIS — Z01812 Encounter for preprocedural laboratory examination: Secondary | ICD-10-CM | POA: Insufficient documentation

## 2020-08-02 LAB — CBC WITH DIFFERENTIAL/PLATELET
Abs Immature Granulocytes: 0.02 10*3/uL (ref 0.00–0.07)
Basophils Absolute: 0.1 10*3/uL (ref 0.0–0.1)
Basophils Relative: 1 %
Eosinophils Absolute: 0.1 10*3/uL (ref 0.0–0.5)
Eosinophils Relative: 1 %
HCT: 45 % (ref 39.0–52.0)
Hemoglobin: 14.7 g/dL (ref 13.0–17.0)
Immature Granulocytes: 0 %
Lymphocytes Relative: 39 %
Lymphs Abs: 3.3 10*3/uL (ref 0.7–4.0)
MCH: 28.3 pg (ref 26.0–34.0)
MCHC: 32.7 g/dL (ref 30.0–36.0)
MCV: 86.5 fL (ref 80.0–100.0)
Monocytes Absolute: 0.7 10*3/uL (ref 0.1–1.0)
Monocytes Relative: 9 %
Neutro Abs: 4.2 10*3/uL (ref 1.7–7.7)
Neutrophils Relative %: 50 %
Platelets: 301 10*3/uL (ref 150–400)
RBC: 5.2 MIL/uL (ref 4.22–5.81)
RDW: 14.5 % (ref 11.5–15.5)
WBC: 8.3 10*3/uL (ref 4.0–10.5)
nRBC: 0 % (ref 0.0–0.2)

## 2020-08-02 LAB — BASIC METABOLIC PANEL
Anion gap: 9 (ref 5–15)
BUN: 12 mg/dL (ref 6–20)
CO2: 28 mmol/L (ref 22–32)
Calcium: 9.3 mg/dL (ref 8.9–10.3)
Chloride: 99 mmol/L (ref 98–111)
Creatinine, Ser: 0.92 mg/dL (ref 0.61–1.24)
GFR, Estimated: >60 mL/min (ref >60)
Glucose, Bld: 84 mg/dL (ref 70–99)
Potassium: 4.5 mmol/L (ref 3.5–5.1)
Sodium: 136 mmol/L (ref 135–145)

## 2020-08-02 LAB — PREPARE RBC (CROSSMATCH)

## 2020-08-03 NOTE — Telephone Encounter (Signed)
Will you check this on [facility] ?

## 2020-08-03 NOTE — Telephone Encounter (Signed)
Still under review per [facility].

## 2020-08-04 ENCOUNTER — Other Ambulatory Visit (HOSPITAL_COMMUNITY)
Admission: RE | Admit: 2020-08-04 | Discharge: 2020-08-04 | Disposition: A | Discharge status: Home / Self Care | Payer: Medicaid Other | Source: Ambulatory Visit | Attending: Orthopedic Surgery | Admitting: Orthopedic Surgery

## 2020-08-04 DIAGNOSIS — Z01812 Encounter for preprocedural laboratory examination: Secondary | ICD-10-CM | POA: Insufficient documentation

## 2020-08-04 DIAGNOSIS — Z20822 Contact with and (suspected) exposure to covid-19: Secondary | ICD-10-CM | POA: Diagnosis not present

## 2020-08-05 ENCOUNTER — Ambulatory Visit
Admission: RE | Admit: 2020-08-05 | Discharge: 2020-08-05 | Disposition: A | Discharge status: Home / Self Care | Payer: Medicaid Other | Source: Ambulatory Visit | Attending: Family Medicine | Admitting: Family Medicine

## 2020-08-05 ENCOUNTER — Other Ambulatory Visit: Payer: Self-pay

## 2020-08-07 ENCOUNTER — Other Ambulatory Visit: Payer: Self-pay

## 2020-08-07 ENCOUNTER — Other Ambulatory Visit: Payer: Self-pay | Admitting: Orthopedic Surgery

## 2020-08-07 ENCOUNTER — Other Ambulatory Visit (HOSPITAL_COMMUNITY)
Admission: RE | Admit: 2020-08-07 | Discharge: 2020-08-07 | Disposition: A | Discharge status: Home / Self Care | Payer: Medicaid Other | Source: Ambulatory Visit | Attending: Orthopedic Surgery | Admitting: Orthopedic Surgery

## 2020-08-07 DIAGNOSIS — G8929 Other chronic pain: Secondary | ICD-10-CM

## 2020-08-07 LAB — SARS CORONAVIRUS 2 (TAT 6-24 HRS): SARS Coronavirus 2: NEGATIVE

## 2020-08-07 NOTE — H&P (Signed)
TOTAL KNEE REVISION ADMISSION H&P  Patient is being admitted for right revision total knee arthroplasty.  Subjective:  Chief Complaint:right knee pain.  HPI: Victor [name], 44 y.o. male, has a history of pain and functional disability in the right knee; [name] left the hospital in August when he had his initial index arthroplasty with a range of motion of 2-100 degrees walking 200 feet.  He progressively lost motion and was a candidate for manipulation under anesthesia however insurance would not cover him to have a postop CPM machine and then with Covid restrictions surgery was delayed.  Prior to his knee surgery he had multiple procedures on the right knee including ACL allograft reconstruction which did not relieve his pain and he presented for knee replacement in August 2021  At this point it is felt that a revision of his tibial component to address his flexion extension deficits would be the most appropriate procedure to try to regain a functional knee  He is aware that because of his chronic pain chronic opioid therapy that he will have difficulty with pain management postoperatively.  However, he is willing to proceed based on the amount of pain and loss of function that he is having  He has had preoperative laboratory work which indicate there is no infection in the right knee  Patient Active Problem List   Diagnosis Date Noted  . 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  . S/P hardware removal right knee 09/21/19 10/04/2019  . Essential hypertension 04/07/2019  . Family history of early CAD 04/07/2019  . History of chest pain 03/10/2019  . Hyperlipidemia 03/10/2019  . Tobacco abuse 03/10/2019  . S/P right knee arthroscopy 02/05/18 06/23/2017  . S/P ACL repair right 05/19/18   . Chondromalacia of lateral femoral condyle, right   . Chondromalacia, patella, right    Past Medical History:  Diagnosis Date  . GERD (gastroesophageal reflux disease)   . History of stomach  ulcers   . Hypertension     Past Surgical History:  Procedure Laterality Date  . ANTERIOR CRUCIATE LIGAMENT REPAIR Right 05/19/2018   Procedure: RIGHT KNEE ARTHROSCOPY WITH ANTERIOR CRUCIATE LIGAMENT (ACL) REPAIR and MEDIAL MENISECTOMY;  Surgeon: [name], MD;  Location: [facility];  Service: Orthopedics;  Laterality: Right;  . APPENDECTOMY    . HARDWARE REMOVAL Right 09/21/2019   Procedure: HARDWARE REMOVAL (RICHARD'S STAPLE) RIGHT KNEE;  Surgeon: [name], MD;  Location: [facility];  Service: Orthopedics;  Laterality: Right;  . KNEE ARTHROSCOPY WITH MEDIAL MENISECTOMY Right 05/29/2017   Procedure: KNEE ARTHROSCOPY WITH MEDIAL MENISECTOMY and lateral meniscectomy;  Surgeon: [name], MD;  Location: [facility];  Service: Orthopedics;  Laterality: Right;  . KNEE ARTHROSCOPY WITH MEDIAL MENISECTOMY Right 02/05/2018   Procedure: RIGHT KNEE ARTHROSCOPY WITH PARTIAL MEDIAL AND LATERAL MENISECTOMY;  Surgeon: [name], MD;  Location: [facility];  Service: Orthopedics;  Laterality: Right;  . TOTAL KNEE ARTHROPLASTY Right 03/28/2020   Procedure: TOTAL KNEE ARTHROPLASTY;  Surgeon: [name], MD;  Location: [facility];  Service: Orthopedics;  Laterality: Right;    No current facility-administered medications for this encounter.   Current Outpatient Medications  Medication Sig Dispense Refill Last Dose  . amLODipine (NORVASC) 10 MG tablet Take 1 tablet (10 mg total) by mouth daily. 90 tablet 3   . ibuprofen (ADVIL) 800 MG tablet Take 800 mg by mouth every 8 (eight) hours as needed.     . methocarbamol (ROBAXIN) 500 MG tablet Take 1  tablet (500 mg total) by mouth every 6 (six) hours as needed for muscle spasms. 56 tablet 1   . polyethylene glycol (MIRALAX / GLYCOLAX) 17 g packet Take 17 g by mouth daily as needed for moderate constipation.     . traMADol (ULTRAM) 50 MG tablet Take 1 tablet (50 mg total) by mouth every 6 (six) hours as needed. (Patient taking differently: Take  50 mg by mouth every 6 (six) hours as needed for moderate pain. ) 28 tablet 1   . atorvastatin (LIPITOR) 80 MG tablet Take 1 tablet (80 mg total) by mouth daily. (Patient not taking: Reported on 07/27/2020) 90 tablet 3 Not Taking at Unknown time  . HYDROcodone-acetaminophen (NORCO/VICODIN) 5-325 MG tablet Take 1 tablet by mouth every 6 (six) hours as needed for up to 7 days for moderate pain. 28 tablet 0    No Known Allergies  Social History   Tobacco Use  . Smoking status: Current Every Day Smoker    Packs/day: 0.50    Years: 15.00    Pack years: 7.50    Types: Cigarettes    Start date: 07/07/1991    Last attempt to quit: 04/09/2018    Years since quitting: 2.3  . Smokeless tobacco: Former Systems developer    Types: Chew    Quit date: 06/09/2018  Substance Use Topics  . Alcohol use: No    Family History  Problem Relation Age of Onset  . Diabetes Mother   . Heart failure Mother   . Cancer Mother   . Pancreatic cancer Mother   . Cancer Father   . Throat cancer Father   . Healthy Sister   . Head & neck cancer Brother   . Bone cancer Maternal Grandmother   . Diabetes Maternal Grandmother   . CAD Maternal Grandfather   . Brain cancer Maternal Grandfather   . Colon cancer Paternal Grandfather       Review of Systems  Chronic right hip pain thought to be lumbar related and radicular pain in his right lower extremity as well  Otherwise no active problems in any other the systems   objective:  Physical Exam He remains normal appearance  Oriented x3  Flat mood flat affect  Gait is remarkable for noticeable limp  Right knee incision is well-healed there is swelling with no erythema and around the joint he basically has 20 degrees of motion from 20-40 the knee feels stable but stiff patella feels locked as well muscle tone is normal skin is intact distal pulses and temperature normal there is no edema he has no lymphadenopathy there is normal sensation but paresthesias in the right leg  see media for self-reported diagram of pain in the right leg coordination and balance otherwise normal   Vital signs in last 24 hours:    Labs:  Estimated body mass index is 32.08 kg/m as calculated from the following:   Height as of 08/02/20: 5\' 11"  (1.803 m).   Weight as of 08/02/20: 104.3 kg.  Imaging Review X-rays show no loosening or signs of infection  Assessment/Plan:  Arthrofibrosis right knee  Revision right total knee.

## 2020-08-07 NOTE — Telephone Encounter (Signed)
Can you ck again surgery tomorrow

## 2020-08-07 NOTE — Telephone Encounter (Signed)
Emailed auth to you

## 2020-08-08 ENCOUNTER — Inpatient Hospital Stay (HOSPITAL_COMMUNITY): Payer: Medicaid Other | Admitting: Certified Registered"

## 2020-08-08 ENCOUNTER — Encounter (HOSPITAL_COMMUNITY): Payer: Self-pay | Admitting: Orthopedic Surgery

## 2020-08-08 ENCOUNTER — Inpatient Hospital Stay (HOSPITAL_COMMUNITY)
Admission: RE | Admit: 2020-08-08 | Discharge: 2020-08-11 | DRG: 468 | Disposition: A | Discharge status: Home Health | Payer: Medicaid Other | Attending: Orthopedic Surgery | Admitting: Orthopedic Surgery

## 2020-08-08 ENCOUNTER — Encounter (HOSPITAL_COMMUNITY)
Admission: RE | Disposition: A | Discharge status: Home Health | Payer: Self-pay | Source: Home / Self Care | Attending: Orthopedic Surgery

## 2020-08-08 ENCOUNTER — Inpatient Hospital Stay (HOSPITAL_COMMUNITY): Payer: Medicaid Other

## 2020-08-08 ENCOUNTER — Other Ambulatory Visit: Payer: Self-pay | Admitting: Orthopedic Surgery

## 2020-08-08 DIAGNOSIS — I10 Essential (primary) hypertension: Secondary | ICD-10-CM | POA: Diagnosis present

## 2020-08-08 DIAGNOSIS — M24661 Ankylosis, right knee: Principal | ICD-10-CM

## 2020-08-08 DIAGNOSIS — Z8249 Family history of ischemic heart disease and other diseases of the circulatory system: Secondary | ICD-10-CM

## 2020-08-08 DIAGNOSIS — Z96651 Presence of right artificial knee joint: Secondary | ICD-10-CM

## 2020-08-08 DIAGNOSIS — T8482XS Fibrosis due to internal orthopedic prosthetic devices, implants and grafts, sequela: Secondary | ICD-10-CM | POA: Diagnosis not present

## 2020-08-08 DIAGNOSIS — F17210 Nicotine dependence, cigarettes, uncomplicated: Secondary | ICD-10-CM | POA: Diagnosis present

## 2020-08-08 DIAGNOSIS — K219 Gastro-esophageal reflux disease without esophagitis: Secondary | ICD-10-CM | POA: Diagnosis present

## 2020-08-08 DIAGNOSIS — Z471 Aftercare following joint replacement surgery: Secondary | ICD-10-CM | POA: Diagnosis not present

## 2020-08-08 DIAGNOSIS — E785 Hyperlipidemia, unspecified: Secondary | ICD-10-CM | POA: Diagnosis present

## 2020-08-08 DIAGNOSIS — M1711 Unilateral primary osteoarthritis, right knee: Secondary | ICD-10-CM | POA: Diagnosis not present

## 2020-08-08 HISTORY — PX: TOTAL KNEE REVISION: SHX996

## 2020-08-08 SURGERY — TOTAL KNEE REVISION
Anesthesia: Spinal | Site: Knee | Laterality: Right

## 2020-08-08 MED ORDER — DEXAMETHASONE SODIUM PHOSPHATE 10 MG/ML IJ SOLN
10.0000 mg | Freq: Four times a day (QID) | INTRAMUSCULAR | Status: AC
  Administered 2020-08-08 – 2020-08-09 (×4): 10 mg via INTRAVENOUS
  Filled 2020-08-08 (×6): qty 1

## 2020-08-08 MED ORDER — CEFAZOLIN SODIUM-DEXTROSE 2-4 GM/100ML-% IV SOLN
2.0000 g | Freq: Four times a day (QID) | INTRAVENOUS | Status: AC
  Administered 2020-08-08 (×2): 2 g via INTRAVENOUS
  Filled 2020-08-08 (×2): qty 100

## 2020-08-08 MED ORDER — HYDROMORPHONE HCL 1 MG/ML IJ SOLN
0.2500 mg | INTRAMUSCULAR | Status: DC | PRN
  Administered 2020-08-08 (×5): 0.5 mg via INTRAVENOUS
  Filled 2020-08-08 (×6): qty 0.5

## 2020-08-08 MED ORDER — LIDOCAINE HCL (CARDIAC) PF 50 MG/5ML IV SOSY
PREFILLED_SYRINGE | INTRAVENOUS | Status: DC | PRN
  Administered 2020-08-08: .5 mL via INTRAVENOUS

## 2020-08-08 MED ORDER — METHOCARBAMOL 500 MG PO TABS
500.0000 mg | ORAL_TABLET | Freq: Four times a day (QID) | ORAL | Status: DC | PRN
  Administered 2020-08-10 – 2020-08-11 (×5): 500 mg via ORAL
  Filled 2020-08-08 (×6): qty 1

## 2020-08-08 MED ORDER — METOPROLOL TARTRATE 5 MG/5ML IV SOLN
INTRAVENOUS | Status: DC | PRN
  Administered 2020-08-08: 2 mg via INTRAVENOUS

## 2020-08-08 MED ORDER — ORAL CARE MOUTH RINSE: 15.0000 mL | Freq: Once | OROMUCOSAL | Status: AC

## 2020-08-08 MED ORDER — PROPOFOL 10 MG/ML IV BOLUS
INTRAVENOUS | Status: AC
  Filled 2020-08-08: qty 40

## 2020-08-08 MED ORDER — PROPOFOL 10 MG/ML IV BOLUS
INTRAVENOUS | Status: AC
  Filled 2020-08-08: qty 20

## 2020-08-08 MED ORDER — HYDROMORPHONE HCL 1 MG/ML IJ SOLN
INTRAMUSCULAR | Status: AC
  Filled 2020-08-08: qty 0.5

## 2020-08-08 MED ORDER — AMLODIPINE BESYLATE 10 MG PO TABS
10.0000 mg | ORAL_TABLET | Freq: Every day | ORAL | Status: DC
  Filled 2020-08-08 (×2): qty 1

## 2020-08-08 MED ORDER — MIDAZOLAM HCL 2 MG/2ML IJ SOLN
INTRAMUSCULAR | Status: AC
  Filled 2020-08-08: qty 4

## 2020-08-08 MED ORDER — METHOCARBAMOL 1000 MG/10ML IJ SOLN
500.0000 mg | Freq: Four times a day (QID) | INTRAVENOUS | Status: DC | PRN
  Filled 2020-08-08: qty 5

## 2020-08-08 MED ORDER — TRANEXAMIC ACID-NACL 1000-0.7 MG/100ML-% IV SOLN: 1000.0000 mg | Freq: Once | INTRAVENOUS | Status: DC

## 2020-08-08 MED ORDER — FENTANYL CITRATE (PF) 100 MCG/2ML IJ SOLN
INTRAMUSCULAR | Status: AC
  Filled 2020-08-08: qty 2

## 2020-08-08 MED ORDER — OXYCODONE HCL ER 10 MG PO T12A
10.0000 mg | EXTENDED_RELEASE_TABLET | Freq: Two times a day (BID) | ORAL | Status: DC
  Administered 2020-08-08 – 2020-08-11 (×6): 10 mg via ORAL
  Filled 2020-08-08 (×7): qty 1

## 2020-08-08 MED ORDER — BISACODYL 5 MG PO TBEC
5.0000 mg | DELAYED_RELEASE_TABLET | Freq: Every day | ORAL | Status: DC | PRN
  Filled 2020-08-08: qty 1

## 2020-08-08 MED ORDER — ONDANSETRON HCL 4 MG/2ML IJ SOLN
INTRAMUSCULAR | Status: AC
  Filled 2020-08-08: qty 2

## 2020-08-08 MED ORDER — METHOCARBAMOL 1000 MG/10ML IJ SOLN
INTRAMUSCULAR | Status: AC
  Filled 2020-08-08: qty 10

## 2020-08-08 MED ORDER — BUPIVACAINE-EPINEPHRINE 0.5% -1:200000 IJ SOLN
INTRAMUSCULAR | Status: DC | PRN
  Administered 2020-08-08: 20 mL

## 2020-08-08 MED ORDER — TRAMADOL HCL 50 MG PO TABS
50.0000 mg | ORAL_TABLET | Freq: Four times a day (QID) | ORAL | Status: DC
  Administered 2020-08-09 – 2020-08-11 (×11): 50 mg via ORAL
  Filled 2020-08-08 (×12): qty 1

## 2020-08-08 MED ORDER — HYDROMORPHONE HCL 1 MG/ML IJ SOLN
INTRAMUSCULAR | Status: DC | PRN
  Administered 2020-08-08: .5 mg via INTRAVENOUS

## 2020-08-08 MED ORDER — DEXMEDETOMIDINE (PRECEDEX) IN NS 20 MCG/5ML (4 MCG/ML) IV SYRINGE
PREFILLED_SYRINGE | INTRAVENOUS | Status: AC
  Filled 2020-08-08: qty 5

## 2020-08-08 MED ORDER — METOPROLOL TARTRATE 5 MG/5ML IV SOLN
INTRAVENOUS | Status: AC
  Filled 2020-08-08: qty 5

## 2020-08-08 MED ORDER — 0.9 % SODIUM CHLORIDE (POUR BTL) OPTIME
TOPICAL | Status: DC | PRN
  Administered 2020-08-08: 08:00:00 1000 mL

## 2020-08-08 MED ORDER — DEXAMETHASONE SODIUM PHOSPHATE 10 MG/ML IJ SOLN
INTRAMUSCULAR | Status: AC
  Filled 2020-08-08: qty 1

## 2020-08-08 MED ORDER — ONDANSETRON HCL 4 MG/2ML IJ SOLN
4.0000 mg | Freq: Four times a day (QID) | INTRAMUSCULAR | Status: DC
  Administered 2020-08-08 – 2020-08-10 (×6): 4 mg via INTRAVENOUS
  Filled 2020-08-08 (×7): qty 2

## 2020-08-08 MED ORDER — PHENOL 1.4 % MT LIQD
1.0000 | OROMUCOSAL | Status: DC | PRN
  Filled 2020-08-08: qty 177

## 2020-08-08 MED ORDER — ONDANSETRON HCL 4 MG/2ML IJ SOLN
INTRAMUSCULAR | Status: DC | PRN
  Administered 2020-08-08: 4 mg via INTRAVENOUS

## 2020-08-08 MED ORDER — BUPIVACAINE-EPINEPHRINE (PF) 0.5% -1:200000 IJ SOLN
INTRAMUSCULAR | Status: AC
  Filled 2020-08-08: qty 30

## 2020-08-08 MED ORDER — BUPIVACAINE LIPOSOME 1.3 % IJ SUSP
INTRAMUSCULAR | Status: DC | PRN
  Administered 2020-08-08: 20 mL

## 2020-08-08 MED ORDER — PREGABALIN 50 MG PO CAPS: 50.0000 mg | ORAL_CAPSULE | Freq: Three times a day (TID) | ORAL | Status: DC

## 2020-08-08 MED ORDER — MAGNESIUM CITRATE PO SOLN
1.0000 | Freq: Once | ORAL | Status: DC | PRN
  Filled 2020-08-08: qty 296

## 2020-08-08 MED ORDER — DEXAMETHASONE SODIUM PHOSPHATE 10 MG/ML IJ SOLN
INTRAMUSCULAR | Status: DC | PRN
  Administered 2020-08-08: 10 mg via INTRAVENOUS

## 2020-08-08 MED ORDER — BUPIVACAINE LIPOSOME 1.3 % IJ SUSP
INTRAMUSCULAR | Status: AC
  Filled 2020-08-08: qty 20

## 2020-08-08 MED ORDER — ONDANSETRON HCL 4 MG/2ML IJ SOLN: 4.0000 mg | Freq: Four times a day (QID) | INTRAMUSCULAR | Status: DC | PRN

## 2020-08-08 MED ORDER — ROPIVACAINE HCL 5 MG/ML IJ SOLN
INTRAMUSCULAR | Status: AC
  Filled 2020-08-08: qty 30

## 2020-08-08 MED ORDER — PROPOFOL 10 MG/ML IV BOLUS
INTRAVENOUS | Status: DC | PRN
  Administered 2020-08-08: 50 mg via INTRAVENOUS

## 2020-08-08 MED ORDER — HYDROMORPHONE HCL 1 MG/ML IJ SOLN: 0.5000 mg | INTRAMUSCULAR | Status: DC | PRN

## 2020-08-08 MED ORDER — FENTANYL CITRATE (PF) 100 MCG/2ML IJ SOLN
INTRAMUSCULAR | Status: DC | PRN
  Administered 2020-08-08: 25 ug via INTRAVENOUS
  Administered 2020-08-08: 100 ug via INTRAVENOUS
  Administered 2020-08-08 (×4): 25 ug via INTRAVENOUS
  Administered 2020-08-08: 50 ug via INTRAVENOUS
  Administered 2020-08-08: 25 ug via INTRAVENOUS

## 2020-08-08 MED ORDER — MIDAZOLAM HCL 5 MG/5ML IJ SOLN
INTRAMUSCULAR | Status: DC | PRN
  Administered 2020-08-08: 3 mg via INTRAVENOUS
  Administered 2020-08-08: 1 mg via INTRAVENOUS

## 2020-08-08 MED ORDER — EPHEDRINE 5 MG/ML INJ
INTRAVENOUS | Status: AC
  Filled 2020-08-08: qty 10

## 2020-08-08 MED ORDER — CEFAZOLIN SODIUM-DEXTROSE 2-4 GM/100ML-% IV SOLN
INTRAVENOUS | Status: AC
  Filled 2020-08-08: qty 100

## 2020-08-08 MED ORDER — METOCLOPRAMIDE HCL 5 MG PO TABS
5.0000 mg | ORAL_TABLET | Freq: Three times a day (TID) | ORAL | Status: DC | PRN
Start: 2020-08-08 — End: 2020-08-11
  Filled 2020-08-08: qty 2

## 2020-08-08 MED ORDER — CHLORHEXIDINE GLUCONATE 0.12 % MT SOLN
15.0000 mL | Freq: Once | OROMUCOSAL | Status: AC
  Administered 2020-08-08: 07:00:00 15 mL via OROMUCOSAL

## 2020-08-08 MED ORDER — PREGABALIN 50 MG PO CAPS
ORAL_CAPSULE | ORAL | Status: AC
  Filled 2020-08-08: qty 1

## 2020-08-08 MED ORDER — DEXMEDETOMIDINE HCL 200 MCG/2ML IV SOLN
INTRAVENOUS | Status: DC | PRN
  Administered 2020-08-08: 8 ug via INTRAVENOUS
  Administered 2020-08-08: 20 ug via INTRAVENOUS

## 2020-08-08 MED ORDER — BUPIVACAINE IN DEXTROSE 0.75-8.25 % IT SOLN
INTRATHECAL | Status: DC | PRN
  Administered 2020-08-08: 2 mL via INTRATHECAL

## 2020-08-08 MED ORDER — GLYCOPYRROLATE 0.2 MG/ML IJ SOLN
INTRAMUSCULAR | Status: DC | PRN
  Administered 2020-08-08 (×2): .1 mg via INTRAVENOUS

## 2020-08-08 MED ORDER — DOCUSATE SODIUM 100 MG PO CAPS
100.0000 mg | ORAL_CAPSULE | Freq: Two times a day (BID) | ORAL | Status: DC
  Administered 2020-08-08 – 2020-08-11 (×6): 100 mg via ORAL
  Filled 2020-08-08 (×6): qty 1

## 2020-08-08 MED ORDER — CEFAZOLIN SODIUM-DEXTROSE 2-4 GM/100ML-% IV SOLN
2.0000 g | INTRAVENOUS | Status: AC
  Administered 2020-08-08: 08:00:00 2 g via INTRAVENOUS

## 2020-08-08 MED ORDER — TRANEXAMIC ACID-NACL 1000-0.7 MG/100ML-% IV SOLN
1000.0000 mg | INTRAVENOUS | Status: AC
  Administered 2020-08-08: 08:00:00 1000 mg via INTRAVENOUS

## 2020-08-08 MED ORDER — CHLORHEXIDINE GLUCONATE 0.12 % MT SOLN
OROMUCOSAL | Status: AC
  Filled 2020-08-08: qty 15

## 2020-08-08 MED ORDER — ACETAMINOPHEN 500 MG PO TABS
1000.0000 mg | ORAL_TABLET | Freq: Four times a day (QID) | ORAL | Status: AC
  Administered 2020-08-08 – 2020-08-09 (×3): 1000 mg via ORAL
  Filled 2020-08-08 (×4): qty 2

## 2020-08-08 MED ORDER — OXYCODONE HCL 5 MG PO TABS
5.0000 mg | ORAL_TABLET | ORAL | Status: DC
Start: 2020-08-08 — End: 2020-08-11
  Administered 2020-08-08 – 2020-08-11 (×18): 5 mg via ORAL
  Filled 2020-08-08 (×17): qty 1

## 2020-08-08 MED ORDER — ONDANSETRON HCL 4 MG PO TABS
4.0000 mg | ORAL_TABLET | Freq: Four times a day (QID) | ORAL | Status: DC | PRN
  Administered 2020-08-11: 11:00:00 4 mg via ORAL
  Filled 2020-08-08 (×2): qty 1

## 2020-08-08 MED ORDER — OXYCODONE HCL 5 MG PO TABS
5.0000 mg | ORAL_TABLET | ORAL | Status: DC | PRN
  Administered 2020-08-08: 5 mg via ORAL
  Filled 2020-08-08: qty 1

## 2020-08-08 MED ORDER — DIPHENHYDRAMINE HCL 12.5 MG/5ML PO ELIX: 12.5000 mg | ORAL_SOLUTION | ORAL | Status: DC | PRN

## 2020-08-08 MED ORDER — METOCLOPRAMIDE HCL 5 MG/ML IJ SOLN: 5.0000 mg | Freq: Three times a day (TID) | INTRAMUSCULAR | Status: DC | PRN

## 2020-08-08 MED ORDER — AMLODIPINE BESYLATE 5 MG PO TABS
10.0000 mg | ORAL_TABLET | Freq: Every day | ORAL | Status: DC
  Administered 2020-08-09 – 2020-08-11 (×3): 10 mg via ORAL
  Filled 2020-08-08: qty 1
  Filled 2020-08-08 (×3): qty 2

## 2020-08-08 MED ORDER — OXYCODONE HCL 5 MG PO TABS
10.0000 mg | ORAL_TABLET | ORAL | Status: DC | PRN
Start: 2020-08-08 — End: 2020-08-11

## 2020-08-08 MED ORDER — TRANEXAMIC ACID-NACL 1000-0.7 MG/100ML-% IV SOLN
INTRAVENOUS | Status: AC
  Filled 2020-08-08: qty 100

## 2020-08-08 MED ORDER — POLYETHYLENE GLYCOL 3350 17 G PO PACK
17.0000 g | PACK | Freq: Every day | ORAL | Status: DC
  Administered 2020-08-09 – 2020-08-11 (×3): 17 g via ORAL
  Filled 2020-08-08 (×5): qty 1

## 2020-08-08 MED ORDER — PREGABALIN 50 MG PO CAPS
50.0000 mg | ORAL_CAPSULE | Freq: Three times a day (TID) | ORAL | Status: DC
  Administered 2020-08-08 – 2020-08-11 (×9): 50 mg via ORAL
  Filled 2020-08-08 (×8): qty 1

## 2020-08-08 MED ORDER — SODIUM CHLORIDE 0.9 % IR SOLN
Status: DC | PRN
  Administered 2020-08-08: 3000 mL

## 2020-08-08 MED ORDER — OXYCODONE HCL 5 MG PO TABS
ORAL_TABLET | ORAL | Status: AC
  Filled 2020-08-08: qty 1

## 2020-08-08 MED ORDER — PROPOFOL 10 MG/ML IV BOLUS
INTRAVENOUS | Status: AC
  Filled 2020-08-08: qty 80

## 2020-08-08 MED ORDER — PROPOFOL 500 MG/50ML IV EMUL
INTRAVENOUS | Status: DC | PRN
  Administered 2020-08-08: 100 ug/kg/min via INTRAVENOUS

## 2020-08-08 MED ORDER — METHOCARBAMOL 1000 MG/10ML IJ SOLN
500.0000 mg | Freq: Four times a day (QID) | INTRAVENOUS | Status: DC
  Administered 2020-08-08 – 2020-08-10 (×7): 500 mg via INTRAVENOUS
  Filled 2020-08-08 (×2): qty 5

## 2020-08-08 MED ORDER — ONDANSETRON HCL 4 MG/2ML IJ SOLN
4.0000 mg | Freq: Once | INTRAMUSCULAR | Status: AC | PRN
  Administered 2020-08-08: 4 mg via INTRAVENOUS
  Filled 2020-08-08: qty 2

## 2020-08-08 MED ORDER — MENTHOL 3 MG MT LOZG
1.0000 | LOZENGE | OROMUCOSAL | Status: DC | PRN
  Filled 2020-08-08: qty 9

## 2020-08-08 MED ORDER — KETAMINE HCL 50 MG/5ML IJ SOSY
PREFILLED_SYRINGE | INTRAMUSCULAR | Status: AC
  Filled 2020-08-08: qty 5

## 2020-08-08 MED ORDER — LACTATED RINGERS IV SOLN: INTRAVENOUS | Status: DC

## 2020-08-08 MED ORDER — ASPIRIN EC 325 MG PO TBEC
325.0000 mg | DELAYED_RELEASE_TABLET | Freq: Every day | ORAL | Status: DC
  Administered 2020-08-09 – 2020-08-11 (×3): 325 mg via ORAL
  Filled 2020-08-08 (×4): qty 1

## 2020-08-08 MED ORDER — KETAMINE HCL 10 MG/ML IJ SOLN
INTRAMUSCULAR | Status: DC | PRN
  Administered 2020-08-08 (×4): 10 mg via INTRAVENOUS

## 2020-08-08 SURGICAL SUPPLY — 77 items
ATTUNE PSRP INSR SZ6 12 KNEE (Insert) ×1 IMPLANT
ATTUNE PSRP INSR SZ6 12MM KNEE (Insert) ×1 IMPLANT
BANDAGE ELASTIC 6 VELCRO NS (GAUZE/BANDAGES/DRESSINGS) ×4 IMPLANT
BANDAGE ESMARK 6X9 LF (GAUZE/BANDAGES/DRESSINGS) ×1 IMPLANT
BLADE SAG 18X100X1.27 (BLADE) ×3 IMPLANT
BLADE SAW SAG 90X13X1.27 (BLADE) ×3 IMPLANT
BLADE SAW SGTL 11.0X1.19X90.0M (BLADE) ×2 IMPLANT
BNDG CMPR 9X6 STRL LF SNTH (GAUZE/BANDAGES/DRESSINGS) ×1
BNDG CMPR STD VLCR NS LF 5.8X4 (GAUZE/BANDAGES/DRESSINGS) ×2
BNDG CMPR STD VLCR NS LF 5.8X6 (GAUZE/BANDAGES/DRESSINGS) ×1
BNDG ELASTIC 4X5.8 VLCR NS LF (GAUZE/BANDAGES/DRESSINGS) ×6 IMPLANT
BNDG ELASTIC 6X5.8 VLCR NS LF (GAUZE/BANDAGES/DRESSINGS) ×3 IMPLANT
BNDG ESMARK 6X9 LF (GAUZE/BANDAGES/DRESSINGS) ×3
BOWL SMART MIX CTS (DISPOSABLE) ×3 IMPLANT
BSPLAT TIB 8 CMNT REV ROT PLAT (Knees) ×1 IMPLANT
CEMENT HV SMART SET (Cement) ×6 IMPLANT
CLOTH BEACON ORANGE TIMEOUT ST (SAFETY) ×3 IMPLANT
COOLER ICEMAN CLASSIC (MISCELLANEOUS) ×3 IMPLANT
COVER LIGHT HANDLE STERIS (MISCELLANEOUS) ×6 IMPLANT
COVER WAND RF STERILE (DRAPES) ×3 IMPLANT
CUFF TOURN SGL QUICK 34 (TOURNIQUET CUFF) ×3
CUFF TRNQT CYL 34X4.125X (TOURNIQUET CUFF) ×1 IMPLANT
DECANTER SPIKE VIAL GLASS SM (MISCELLANEOUS) ×2 IMPLANT
DRAPE BACK TABLE (DRAPES) ×3 IMPLANT
DRAPE EXTREMITY T 121X128X90 (DISPOSABLE) ×3 IMPLANT
DRSG MEPILEX BORDER 4X12 (GAUZE/BANDAGES/DRESSINGS) ×3 IMPLANT
DURAPREP 26ML APPLICATOR (WOUND CARE) ×6 IMPLANT
ELECT REM PT RETURN 9FT ADLT (ELECTROSURGICAL) ×3
ELECTRODE REM PT RTRN 9FT ADLT (ELECTROSURGICAL) ×1 IMPLANT
GLOVE BIO SURGEON STRL SZ7 (GLOVE) ×4 IMPLANT
GLOVE BIOGEL PI IND STRL 7.0 (GLOVE) ×5 IMPLANT
GLOVE BIOGEL PI INDICATOR 7.0 (GLOVE) ×10
GLOVE ECLIPSE 6.5 STRL STRAW (GLOVE) ×2 IMPLANT
GLOVE SKINSENSE NS SZ8.0 LF (GLOVE) ×2
GLOVE SKINSENSE STRL SZ8.0 LF (GLOVE) ×1 IMPLANT
GLOVE SS N UNI LF 8.5 STRL (GLOVE) ×3 IMPLANT
GOWN STRL REUS W/TWL LRG LVL3 (GOWN DISPOSABLE) ×9 IMPLANT
GOWN STRL REUS W/TWL XL LVL3 (GOWN DISPOSABLE) ×3 IMPLANT
HANDPIECE INTERPULSE COAX TIP (DISPOSABLE) ×3
HOOD W/PEELAWAY (MISCELLANEOUS) ×12 IMPLANT
INSERT TIB CMT ATTUNE RP SZ8 (Knees) ×2 IMPLANT
INST SET MAJOR BONE (KITS) ×3 IMPLANT
IV NS IRRIG 3000ML ARTHROMATIC (IV SOLUTION) ×3 IMPLANT
KIT SURGICAL DEVON (SET/KITS/TRAYS/PACK) ×3 IMPLANT
KIT TURNOVER KIT A (KITS) ×3 IMPLANT
MANIFOLD NEPTUNE II (INSTRUMENTS) ×3 IMPLANT
MARKER SKIN DUAL TIP RULER LAB (MISCELLANEOUS) ×3 IMPLANT
NDL HYPO 18GX1.5 BLUNT FILL (NEEDLE) ×1 IMPLANT
NDL HYPO 21X1.5 SAFETY (NEEDLE) ×1 IMPLANT
NEEDLE HYPO 18GX1.5 BLUNT FILL (NEEDLE) ×3 IMPLANT
NEEDLE HYPO 21X1.5 SAFETY (NEEDLE) ×3 IMPLANT
NS IRRIG 1000ML POUR BTL (IV SOLUTION) ×3 IMPLANT
OSTEOTOME THIN 6.0 3 (INSTRUMENTS) ×2 IMPLANT
PACK TOTAL JOINT (CUSTOM PROCEDURE TRAY) ×3 IMPLANT
PAD ARMBOARD 7.5X6 YLW CONV (MISCELLANEOUS) ×3 IMPLANT
PAD COLD SHLDR WRAP-ON (PAD) ×3 IMPLANT
PENCIL SMOKE EVACUATOR (MISCELLANEOUS) ×3 IMPLANT
PILLOW KNEE EXTENSION 0 DEG (MISCELLANEOUS) ×3 IMPLANT
PIN/DRILL PACK ORTHO 1/8X3.0 (PIN) ×2 IMPLANT
SAW OSC TIP CART 19.5X105X1.3 (SAW) ×2 IMPLANT
SET BASIN LINEN APH (SET/KITS/TRAYS/PACK) ×3 IMPLANT
SET HNDPC FAN SPRY TIP SCT (DISPOSABLE) ×1 IMPLANT
STAPLER VISISTAT 35W (STAPLE) ×3 IMPLANT
SUT BONE WAX W31G (SUTURE) ×2 IMPLANT
SUT BRALON NAB BRD #1 30IN (SUTURE) ×6 IMPLANT
SUT MNCRL 0 VIOLET CTX 36 (SUTURE) ×1 IMPLANT
SUT MON AB 0 CT1 (SUTURE) ×2 IMPLANT
SUT MON AB 2-0 CT1 36 (SUTURE) IMPLANT
SUT MONOCRYL 0 CTX 36 (SUTURE) ×3
SYR 30ML LL (SYRINGE) ×3 IMPLANT
SYR BULB IRRIG 60ML STRL (SYRINGE) ×3 IMPLANT
TOWEL OR 17X26 4PK STRL BLUE (TOWEL DISPOSABLE) ×3 IMPLANT
TOWER CARTRIDGE SMART MIX (DISPOSABLE) ×3 IMPLANT
TRAY FOLEY W/BAG SLVR 16FR (SET/KITS/TRAYS/PACK) ×3
TRAY FOLEY W/BAG SLVR 16FR ST (SET/KITS/TRAYS/PACK) ×1 IMPLANT
WATER STERILE IRR 1000ML POUR (IV SOLUTION) ×6 IMPLANT
YANKAUER SUCT 12FT TUBE ARGYLE (SUCTIONS) ×3 IMPLANT

## 2020-08-08 NOTE — Progress Notes (Signed)
Instructed on incentive spirometer. 2250 ml obtained. Tolerated well.

## 2020-08-08 NOTE — Transfer of Care (Signed)
Immediate Anesthesia Transfer of Care Note  Patient: Victor [name]  Procedure(s) Performed: REVISION OF RIGHT TOTAL KNEE  TIBIAL COMPONENTS ONLY (Right Knee)  Patient Location: PACU  Anesthesia Type:Spinal  Level of Consciousness: awake, alert , oriented and patient cooperative  Airway & Oxygen Therapy: Patient Spontanous Breathing and Patient connected to nasal cannula oxygen  Post-op Assessment: Report given to RN, Post -op Vital signs reviewed and stable and Patient moving all extremities  Post vital signs: Reviewed and stable  Last Vitals:  Vitals Value Taken Time  BP 179/78 08/08/20 1016  Temp    Pulse 92 08/08/20 1021  Resp 14 08/08/20 1021  SpO2 83 % 08/08/20 1021  Vitals shown include unvalidated device data.  Last Pain:  Vitals:   08/08/20 0651  PainSc: 5          Complications: No complications documented.

## 2020-08-08 NOTE — Interval H&P Note (Signed)
History and Physical Interval Note:  08/08/2020 7:22 AM  Victor [name]  has presented today for surgery, with the diagnosis of right total knee replacement arthrofibrosis.  The various methods of treatment have been discussed with the patient and family. After consideration of risks, benefits and other options for treatment, the patient has consented to  Procedure(s): RIGHT TOTAL KNEE REPLACEMENT; REVISION TIBIAL AND FEMORAL COMPONENTS (Right) as a surgical intervention.  The patient's history has been reviewed, patient examined, no change in status, stable for surgery.  I have reviewed the patient's chart and labs.  Questions were answered to the patient's satisfaction.   CBC Latest Ref Rng & Units 08/02/2020 07/24/2020 06/08/2020  WBC 4.0 - 10.5 K/uL 8.3 8.7 8.6  Hemoglobin 13.0 - 17.0 g/dL 14.7 15.5 15.5  Hematocrit 39.0 - 52.0 % 45.0 45.9 46.1  Platelets 150 - 400 K/uL 301 320 363     [address]

## 2020-08-08 NOTE — Anesthesia Procedure Notes (Signed)
Spinal  Patient location during procedure: OR Staffing Performed: resident/CRNA  Anesthesiologist: Louann Sjogren, MD Resident/CRNA: Tacy Learn, CRNA Preanesthetic Checklist Completed: patient identified, IV checked, site marked, risks and benefits discussed, surgical consent, monitors and equipment checked, pre-op evaluation and timeout performed Spinal Block Patient position: sitting Prep: ChloraPrep Patient monitoring: heart rate, cardiac monitor, continuous pulse ox and blood pressure Approach: midline Location: L4-5 Injection technique: single-shot Needle Needle type: Quincke  Needle gauge: 22 G Needle length: 10 cm Assessment Sensory level: T4 Additional Notes Pt tolerated well.

## 2020-08-08 NOTE — Op Note (Addendum)
08/08/2020  10:52 AM  PATIENT:  Victor [name]  44 y.o. male  PRE-OPERATIVE DIAGNOSIS:  arthrofibrosis right total knee replacement  POST-OPERATIVE DIAGNOSIS:  arthrofibrosis right total knee replacement  PROCEDURE:  Procedure(s): REVISION OF RIGHT TOTAL KNEE  TIBIAL COMPONENTS ONLY (Right)   Operative findings preop 30 degree flexion contracture 20 degrees range of motion flexion at 50 degrees  Intra-Op no effusion but extensive scar tissue obliteration of medial and lateral gutter quadriceps tendon adhering to the femur extensive thickening of the quadriceps and patellar tendon posteriorly  Implants tibial revision implant size 8 tibia size 12 rotating platform polyethylene   implant [employer]  Implant type Attune  SURGEON:  Surgeon(s) and Role:    * [name], MD - Primary  Surgery was done as follows  The patient was seen in the preop area identified as Victor [name] surgical site confirmed his right total knee and marked  Chart review was completed images and implants checked in satisfactorily in place  Patient was taken to the operating for spinal anesthesia and was placed in the supine position.  Foley catheter was inserted.  Tourniquet was placed on the proximal thigh and the leg was prepped and draped sterilely  Timeout was taken implants were checked again and x-rays were reviewed  Range of motion was checked and measured at 30 to 50 degrees there was no given the knee at all.  Limb was exsanguinated with a 6 inch Esmarch the tourniquet was elevated to 300 mmHg  The prior incision was used to approach the knee and it was extended proximally and distally  Subcutaneous tissue was divided subcutaneous tissue planes were established and the medial arthrotomy was marked with cautery and then incised.  The quadriceps tendon was adhered to the femur the medial lateral gutters were obliterated with scar tissue.  This was excised and the tissue planes were  recreated with excision of scar tissue in the medial lateral gutter and use of periosteal elevator.  The posterior scar tissue from the quadriceps and patellar tendon was excised the proximal tibia was dissected free of any soft tissue and soft tissue in the box of the implant was removed  This was done carefully and sequentially until the patella could be slid laterally.  A posterior skinny Hohmann and lateral wide Hohmann were placed  An osteotome was used to remove the polyethylene it was checked and it was found to be without any cracks that were visible or excessive wear  Loose cement was removed and then flexible and rigid osteotomes were used to remove cement from the proximal tibia, the tibial shaft was irrigated removing any residual cement    At that point we checked the flexion extension gaps using laminae spreaders and both were tight  To address the flexion contracture a cobb elevator was passed along the posterior femur to release the posterior capsule (added on 08/08/20 @ 1:53pm).  We continued preparation of the proximal tibia by recutting it removing 4 mm referencing from the medial side taking equal bone from each side of the tibia since the original cut was at 90 degrees to the mechanical axis of the tibia  The proximal tray size 8 was placed and reaming was performed on the proximal tibia and tibial shaft.  Once this was accomplished serial spacer polyethylene trials were placed the flexion gap stabilized with a size 12 and this matched the extension gap.  A small saw was used on the bone where the 2 fins  of the implant would pass , this was done secondary to the hard nature of the bone.  The 8 tray was cemented in placed and then a retrial was performed up to a size 12 which was stable in flexion and extension with full extension recreated.  Excess cement was removed  The rotating platform polyethylene implant was placed.  The patient had full extension and passive flexion  of 110 degrees and flexion of 125 degrees.  With the knee in 90 degrees of flexion the leg was rotated there was no lift off, at 90 degrees flexion anterior posterior drawer test was stable  The wound was then irrigated the quadriceps tendon and soft tissues were injected with Marcaine with epinephrine and exparel  Final irrigation was performed  Quadriceps tendon and extensor mechanism was closed with 3 running and interrupted [name] followed by subcu closure with 0 Monocryl skin reapproximated with staples  Sterile dressing applied.  Ace wrap used to wrap the leg from the foot to the groin followed by application of Cryo/Cuff which was activated  Patient was taken to recovery room in stable condition    Assistant #1 [name] assistant #2 [name]  Anesthesia spinal with IV propofol  Intra-Op injection 20 cc Marcaine with epinephrine 20 cc full-strength exparel  This was placed in the quadriceps patellar tendon and medial soft tissue sleeve PHYSICIAN ASSISTANT:    EBL:  5 mL   BLOOD ADMINISTERED:none  DRAINS: none   SPECIMEN:  No Specimen  DISPOSITION OF SPECIMEN:  N/A  COUNTS:  YES  TOURNIQUET:   Total Tourniquet Time Documented: Thigh (Right) - 118 minutes Total: Thigh (Right) - 118 minutes   DICTATION: .Viviann Spare Dictation  PLAN OF CARE: Admit to inpatient   PATIENT DISPOSITION:  PACU - hemodynamically stable.   Delay start of Pharmacological VTE agent (>24hrs) due to surgical blood loss or risk of bleeding: yes

## 2020-08-08 NOTE — Evaluation (Signed)
Physical Therapy Evaluation Patient Details Name: Victor [name] MRN: [id number] DOB: 07-Nov-1975 Today's Date: 08/08/2020   RIGHT KNEE ROM: 8 - 60 degrees AMBULATION DISTANCE: 100 feet using RW with Supervision     History of Present Illness  Victor [name] is a 44 y/o male s/p REVISION OF RIGHT TOTAL KNEE  TIBIAL COMPONENTS ONLY (Right) 08/08/20, with the diagnosis of right total knee replacement arthrofibrosis.    Clinical Impression  Patient requires Min assist to move RLE during bed mobility, demonstrates fair/good return for right heel to toe stepping without loss of balance during gait training, limited mostly due to fatigue and mild increase in right knee pain.  Patient put back to bed after therapy with ice machine and immobilizer re-applied.  Patient will benefit from continued physical therapy in hospital and recommended venue below to increase strength, balance, endurance for safe ADLs and gait.    Follow Up Recommendations Home health PT;Supervision for mobility/OOB;Supervision - Intermittent    Equipment Recommendations  None recommended by PT    Recommendations for Other Services       Precautions / Restrictions Precautions Precautions: Fall Restrictions Weight Bearing Restrictions: Yes RLE Weight Bearing: Weight bearing as tolerated      Mobility  Bed Mobility Overal bed mobility: Needs Assistance Bed Mobility: Supine to Sit;Sit to Supine     Supine to sit: Supervision Sit to supine: Min guard;Min assist   General bed mobility comments: increased time, labored movement    Transfers Overall transfer level: Needs assistance Equipment used: Rolling walker (2 wheeled) Transfers: Sit to/from [facility] Sit to Stand: Min guard Stand pivot transfers: Min guard       General transfer comment: increased time, labored movement  Ambulation/Gait Ambulation/Gait assistance: Supervision Gait Distance (Feet): 100 Feet Assistive device:  Rolling walker (2 wheeled) Gait Pattern/deviations: Decreased step length - right;Decreased stance time - right;Decreased stride length;Antalgic Gait velocity: decreased   General Gait Details: slow labored cadence with fair/good return for right heel to toe stepping, no loss of balance, limited mostly due to fatigue and mild increase in right knee pain  Stairs            Wheelchair Mobility    Modified Rankin (Stroke Patients Only)       Balance Overall balance assessment: Needs assistance Sitting-balance support: Feet supported;No upper extremity supported Sitting balance-Leahy Scale: Good Sitting balance - Comments: seated at EOB   Standing balance support: During functional activity;Bilateral upper extremity supported Standing balance-Leahy Scale: Fair Standing balance comment: using RW                             Pertinent Vitals/Pain Pain Assessment: Faces Faces Pain Scale: Hurts even more Pain Location: right knee with movement Pain Descriptors / Indicators: Aching;Sore;Grimacing;Guarding Pain Intervention(s): Limited activity within patient's tolerance;Monitored during session;Repositioned    Home Living Family/patient expects to be discharged to:: Private residence Living Arrangements: Spouse/significant other Available Help at Discharge: Family;Available 24 hours/day Type of Home: House Home Access: Stairs to enter Entrance Stairs-Rails: Right;Left;Can reach both Entrance Stairs-Number of Steps: 7 Home Layout: Two level;Able to live on main level with bedroom/bathroom Home Equipment: Gilford Rile - 2 wheels;Cane - single point      Prior Function Level of Independence: Independent with assistive device(s)         Comments: Hydrographic surveyor, drives     Hand Dominance   Dominant Hand: Right    Extremity/Trunk Assessment  Upper Extremity Assessment Upper Extremity Assessment: Overall WFL for tasks assessed    Lower Extremity  Assessment Lower Extremity Assessment: Generalized weakness;RLE deficits/detail RLE Deficits / Details: grossly -4/5 RLE: Unable to fully assess due to pain RLE Sensation: WNL RLE Coordination: WNL    Cervical / Trunk Assessment Cervical / Trunk Assessment: Normal  Communication   Communication: No difficulties  Cognition Arousal/Alertness: Awake/alert Behavior During Therapy: WFL for tasks assessed/performed Overall Cognitive Status: Within Functional Limits for tasks assessed                                        General Comments      Exercises     Assessment/Plan    PT Assessment Patient needs continued PT services  PT Problem List Decreased strength;Decreased activity tolerance;Decreased balance;Decreased mobility       PT Treatment Interventions DME instruction;Gait training;Stair training;Functional mobility training;Therapeutic activities;Therapeutic exercise;Patient/family education    PT Goals (Current goals can be found in the Care Plan section)  Acute Rehab PT Goals Patient Stated Goal: return home with family to assist PT Goal Formulation: With patient Time For Goal Achievement: 08/11/20 Potential to Achieve Goals: Good    Frequency BID   Barriers to discharge        Co-evaluation               AM-PAC PT "6 Clicks" Mobility  Outcome Measure Help needed turning from your back to your side while in a flat bed without using bedrails?: A Little Help needed moving from lying on your back to sitting on the side of a flat bed without using bedrails?: A Little Help needed moving to and from a bed to a chair (including a wheelchair)?: A Little Help needed standing up from a chair using your arms (e.g., wheelchair or bedside chair)?: A Little Help needed to walk in hospital room?: A Little Help needed climbing 3-5 steps with a railing? : A Lot 6 Click Score: 17    End of Session   Activity Tolerance: Patient tolerated treatment  well;Patient limited by fatigue Patient left: in bed;with call bell/phone within reach Nurse Communication: Mobility status PT Visit Diagnosis: Unsteadiness on feet (R26.81);Other abnormalities of gait and mobility (R26.89);Muscle weakness (generalized) (M62.81)    Time: 2122-4825 PT Time Calculation (min) (ACUTE ONLY): 24 min   Charges:   PT Evaluation $PT Eval Moderate Complexity: 1 Mod PT Treatments $Therapeutic Activity: 23-37 mins        4:13 PM, 08/08/20 Lonell Grandchild, MPT Physical Therapist with [facility] [phone] office [phone] mobile phone

## 2020-08-08 NOTE — Progress Notes (Signed)
Encouraged incentive spirometer. 2500 ml obtained. Tolerated well.

## 2020-08-08 NOTE — Anesthesia Preprocedure Evaluation (Signed)
Anesthesia Evaluation  Patient identified by MRN, date of birth, ID band Patient awake    Reviewed: Allergy & Precautions, H&P , NPO status , Patient's Chart, lab work & pertinent test results, reviewed documented beta blocker date and time   Airway Mallampati: II  TM Distance: >3 FB Neck ROM: full    Dental no notable dental hx.    Pulmonary neg pulmonary ROS, Current Smoker,    Pulmonary exam normal breath sounds clear to auscultation       Cardiovascular Exercise Tolerance: Good hypertension, negative cardio ROS   Rhythm:regular Rate:Normal     Neuro/Psych negative neurological ROS  negative psych ROS   GI/Hepatic Neg liver ROS, GERD  Medicated,  Endo/Other  negative endocrine ROS  Renal/GU negative Renal ROS  negative genitourinary   Musculoskeletal   Abdominal   Peds  Hematology negative hematology ROS (+)   Anesthesia Other Findings   Reproductive/Obstetrics negative OB ROS                             Anesthesia Physical Anesthesia Plan  ASA: II  Anesthesia Plan: Spinal   Post-op Pain Management:  Regional for Post-op pain   Induction:   PONV Risk Score and Plan: Propofol infusion  Airway Management Planned:   Additional Equipment:   Intra-op Plan:   Post-operative Plan:   Informed Consent: I have reviewed the patients History and Physical, chart, labs and discussed the procedure including the risks, benefits and alternatives for the proposed anesthesia with the patient or authorized representative who has indicated his/her understanding and acceptance.     Dental Advisory Given  Plan Discussed with: CRNA  Anesthesia Plan Comments:         Anesthesia Quick Evaluation

## 2020-08-08 NOTE — Plan of Care (Signed)
°  Problem: Acute Rehab PT Goals(only PT should resolve) Goal: Pt Will Go Supine/Side To Sit Outcome: Progressing Flowsheets (Taken 08/08/2020 1614) Pt will go Supine/Side to Sit: with modified independence Goal: Patient Will Transfer Sit To/From Stand Outcome: Progressing Flowsheets (Taken 08/08/2020 1614) Patient will transfer sit to/from stand: with modified independence Goal: Pt Will Transfer Bed To Chair/Chair To Bed Outcome: Progressing Flowsheets (Taken 08/08/2020 1614) Pt will Transfer Bed to Chair/Chair to Bed: with modified independence Goal: Pt Will Ambulate Outcome: Progressing Flowsheets (Taken 08/08/2020 1614) Pt will Ambulate:  > 125 feet  with modified independence  with rolling walker   4:14 PM, 08/08/20 Lonell Grandchild, MPT Physical Therapist with [facility] [phone] office [phone] mobile phone

## 2020-08-08 NOTE — Brief Op Note (Signed)
08/08/2020  10:52 AM  PATIENT:  Victor [name]  44 y.o. male  PRE-OPERATIVE DIAGNOSIS:  arthrofibrosis right total knee replacement  POST-OPERATIVE DIAGNOSIS:  arthrofibrosis right total knee replacement  PROCEDURE:  Procedure(s): REVISION OF RIGHT TOTAL KNEE  TIBIAL COMPONENTS ONLY (Right)   Operative findings preop 30 degree flexion contracture 20 degrees range of motion flexion at 50 degrees  Intra-Op no effusion but extensive scar tissue obliteration of medial and lateral gutter quadriceps tendon adhering to the femur extensive thickening of the quadriceps and patellar tendon posteriorly  Implants tibial revision implant size 8 tibia size 12 rotating platform polyethylene   implant [employer]  Implant type Attune  SURGEON:  Surgeon(s) and Role:    * [name], MD - Primary  Assistant #1 [name] assistant #2 [name]  Anesthesia spinal with IV propofol  Intra-Op injection 20 cc Marcaine with epinephrine 20 cc full-strength exparel  This was placed in the quadriceps patellar tendon and medial soft tissue sleeve PHYSICIAN ASSISTANT:    EBL:  5 mL   BLOOD ADMINISTERED:none  DRAINS: none   SPECIMEN:  No Specimen  DISPOSITION OF SPECIMEN:  N/A  COUNTS:  YES  TOURNIQUET:   Total Tourniquet Time Documented: Thigh (Right) - 118 minutes Total: Thigh (Right) - 118 minutes   DICTATION: .Viviann Spare Dictation  PLAN OF CARE: Admit to inpatient   PATIENT DISPOSITION:  PACU - hemodynamically stable.   Delay start of Pharmacological VTE agent (>24hrs) due to surgical blood loss or risk of bleeding: yes

## 2020-08-08 NOTE — Anesthesia Postprocedure Evaluation (Signed)
Anesthesia Post Note  Patient: Victor [name]  Procedure(s) Performed: REVISION OF RIGHT TOTAL KNEE  TIBIAL COMPONENTS ONLY (Right Knee)  Patient location during evaluation: PACU Anesthesia Type: Spinal Level of consciousness: awake, oriented, awake and alert and patient cooperative Pain management: pain level controlled Vital Signs Assessment: post-procedure vital signs reviewed and stable Respiratory status: spontaneous breathing, respiratory function stable and nonlabored ventilation Cardiovascular status: blood pressure returned to baseline and stable Postop Assessment: no headache and no backache Anesthetic complications: no   No complications documented.   Last Vitals:  Vitals:   08/08/20 0700 08/08/20 0715  BP: 135/81   Pulse: 76 69  Resp: 19 17  Temp:    SpO2: 92% 97%    Last Pain:  Vitals:   08/08/20 0651  PainSc: 5                  Tacy Learn

## 2020-08-09 ENCOUNTER — Encounter (HOSPITAL_COMMUNITY): Payer: Self-pay | Admitting: Orthopedic Surgery

## 2020-08-09 MED ORDER — METHOCARBAMOL 1000 MG/10ML IJ SOLN
INTRAMUSCULAR | Status: AC
  Filled 2020-08-09: qty 10

## 2020-08-09 NOTE — Progress Notes (Signed)
Patient ID: Victor [name], male   DOB: Aug 18, 1976, 44 y.o.   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

## 2020-08-09 NOTE — TOC Initial Note (Signed)
Transition of Care [facility]) - Initial/Assessment Note   Patient Details  Name: Victor [name] MRN: [id number] Date of Birth: 12-06-1975  Transition of Care [facility]) CM/SW Contact:    Sherie Don, LCSW Phone Number: 08/09/2020, 1:45 PM  Clinical Narrative: Patient is a 44 year male who was admitted for revision of total right knee. TOC received consult for HH/DME. Per patient, he already has a cane, walker, and crutches at home. CSW notified patient Dr. Aline Brochure uses [facility] for [place]. Patient aware and agreeable to [place] notifying [facility] of discharge. CSW updated Tim with [facility] regarding expected discharge. TOC to follow.  Expected Discharge Plan: [place] Barriers to Discharge: Continued Medical Work up  Patient Goals and CMS Choice Patient states their goals for this hospitalization and ongoing recovery are:: Discharge home CMS Medicare.gov Compare Post Acute Care list provided to:: Patient Choice offered to / list presented to : Patient  Expected Discharge Plan and Services Expected Discharge Plan: [place] In-house Referral: Clinical Social Work Discharge Planning Services: NA Post Acute Care Choice: [place] arrangements for the past 2 months: Single Family Home              DME Arranged: N/A DME Agency: NA HH Arranged: PT [place] Agency: [facility] [employer] [facility] [employer] Date [place]: 08/09/20 Time [place]: 1338 Representative spoke with at [place]: Tim  Prior Living Arrangements/Services Living arrangements for the past 2 months: [place] with:: Spouse Patient language and need for interpreter reviewed:: Yes Do you feel safe going back to the place where you live?: Yes      Need for Family Participation in Patient Care: No (Comment) Care giver support system in place?: Yes (comment) Current home services: DME (Walker, cane, crutches) Criminal Activity/Legal Involvement  Pertinent to Current Situation/Hospitalization: No - Comment as needed  Activities of Daily Living Home Assistive Devices/Equipment: Cane (specify quad or straight),Walker (specify type),Other (Comment) (polar care) ADL Screening (condition at time of admission) Patient's cognitive ability adequate to safely complete daily activities?: Yes Is the patient deaf or have difficulty hearing?: No Does the patient have difficulty seeing, even when wearing glasses/contacts?: No Does the patient have difficulty concentrating, remembering, or making decisions?: No Patient able to express need for assistance with ADLs?: Yes Does the patient have difficulty dressing or bathing?: No Independently performs ADLs?: Yes (appropriate for developmental age) Does the patient have difficulty walking or climbing stairs?: Yes Weakness of Legs: None Weakness of Arms/Hands: None  Permission Sought/Granted Permission sought to share information with : Other (comment) Permission granted to share information with : Yes, Verbal Permission Granted Permission granted to share info w AGENCY: [facility]  Emotional Assessment Appearance:: Appears stated age Attitude/Demeanor/Rapport: Engaged Affect (typically observed): Accepting Orientation: : Oriented to Self,Oriented to Place,Oriented to  Time,Oriented to Situation Alcohol / Substance Use: Tobacco Use Psych Involvement: No (comment)  Admission diagnosis:  S/P revision of total knee, right [Z96.651] Patient Active Problem List   Diagnosis Date Noted  . S/P revision of total knee, right 08/08/2020  . Arthrofibrosis of knee joint, right   . 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  . S/P hardware removal right knee 09/21/19 10/04/2019  . Essential hypertension 04/07/2019  . Family history of early CAD 04/07/2019  . History of chest pain 03/10/2019  . Hyperlipidemia 03/10/2019  . Tobacco abuse 03/10/2019  . S/P right knee arthroscopy 02/05/18 06/23/2017   .  S/P ACL repair right 05/19/18   . Chondromalacia of lateral femoral condyle, right   . Chondromalacia, patella, right    PCP:  [name], DO Pharmacy:   [facility] [place], [place] [facility] [place] [address] [place] [address] Phone: [phone] Fax: [phone]  Readmission Risk Interventions No flowsheet data found.

## 2020-08-09 NOTE — Progress Notes (Signed)
Physical Therapy Treatment Patient Details Name: Victor [name] MRN: [id number] DOB: Oct 13, 1975 Today's Date: 08/09/2020   RIGHT KNEE ROM: 7-65 degrees AMBULATION DISTANCE: 150 feet using RW with Min Guard to Supervision    History of Present Illness Kinney Sackmann Stoiber is a 44 y/o male s/p REVISION OF RIGHT TOTAL KNEE  TIBIAL COMPONENTS ONLY (Right) 08/08/20, with the diagnosis of right total knee replacement arthrofibrosis.    PT Comments    Patient present in room with wife. Completed PT assigned activities with ROM from 7d from neutral to 65d flexion. Ambulate x150 ft to/from stairs with RW and Min Guard to Supervision, with increasing pain after completing stairs. Ascend/Descend 3 stairs with L railing with MinA-Min Guard.  Patient returned to room sitting in chair with wife in room and nurse notified. Patient will benefit from continued physical therapy in hospital and recommended venue below to increase strength, balance, endurance for safe ADLs and gait.   Follow Up Recommendations  Home health PT;Supervision for mobility/OOB;Supervision - Intermittent     Equipment Recommendations  None recommended by PT    Recommendations for Other Services       Precautions / Restrictions Precautions Precautions: Fall Restrictions Weight Bearing Restrictions: Yes RLE Weight Bearing: Weight bearing as tolerated LLE Weight Bearing: Weight bearing as tolerated    Mobility  Bed Mobility Overal bed mobility: Needs Assistance Bed Mobility: Supine to Sit     Supine to sit: Min assist     General bed mobility comments: increased time, labored movement, assistance moving RLE  Transfers Overall transfer level: Needs assistance Equipment used: Rolling walker (2 wheeled) Transfers: Sit to/from [facility] Sit to Stand: Min assist;Min guard Stand pivot transfers: Min assist;Min guard       General transfer comment: increased time, labored movement, increase in  pain  Ambulation/Gait Ambulation/Gait assistance: Supervision;Min guard Gait Distance (Feet): 150 Feet Assistive device: Rolling walker (2 wheeled) Gait Pattern/deviations: Decreased step length - right;Decreased stance time - right;Decreased stride length;Antalgic;Step-through pattern Gait velocity: decreased   General Gait Details: slow/labored movement, increased difficulty with WB on RLE, attempt progressing to steady movement of RW but resulted in increased pain   Stairs Stairs: Yes Stairs assistance: Min guard;Min assist Stair Management: One rail Left;Step to pattern;Forwards Number of Stairs: 3 General stair comments: labored and slow movements throughout   Wheelchair Mobility    Modified Rankin (Stroke Patients Only)       Balance Overall balance assessment: Needs assistance;Modified Independent Sitting-balance support: Feet supported;No upper extremity supported Sitting balance-Leahy Scale: Good Sitting balance - Comments: seated at EOB   Standing balance support: During functional activity;Bilateral upper extremity supported Standing balance-Leahy Scale: Fair Standing balance comment: using RW                            Cognition Arousal/Alertness: Awake/alert Behavior During Therapy: WFL for tasks assessed/performed Overall Cognitive Status: Within Functional Limits for tasks assessed                                        Exercises Total Joint Exercises Goniometric ROM: 7-65 d General Exercises - Lower Extremity Quad Sets: AROM;Strengthening;Both;10 reps;Supine Gluteal Sets: AROM;Strengthening;Both;10 reps;Supine Heel Slides: AROM;Strengthening;10 reps;Seated;Right Toe Raises: AROM;Strengthening;Both;10 reps;Supine Heel Raises: AROM;Strengthening;Both;10 reps;Supine    General Comments        Pertinent Vitals/Pain Pain Score: 6  Pain Location: right knee with movement Pain Descriptors / Indicators:  Aching;Sore;Grimacing;Guarding Pain Intervention(s): Repositioned;Relaxation;Monitored during session;Limited activity within patient's tolerance    Home Living                      Prior Function            PT Goals (current goals can now be found in the care plan section) Acute Rehab PT Goals Patient Stated Goal: return home with family to assist PT Goal Formulation: With patient Time For Goal Achievement: 08/11/20 Potential to Achieve Goals: Good Progress towards PT goals: Progressing toward goals    Frequency    BID      PT Plan Current plan remains appropriate    Co-evaluation              AM-PAC PT "6 Clicks" Mobility   Outcome Measure  Help needed turning from your back to your side while in a flat bed without using bedrails?: A Little Help needed moving from lying on your back to sitting on the side of a flat bed without using bedrails?: A Little Help needed moving to and from a bed to a chair (including a wheelchair)?: A Little Help needed standing up from a chair using your arms (e.g., wheelchair or bedside chair)?: A Little Help needed to walk in hospital room?: A Little Help needed climbing 3-5 steps with a railing? : A Little 6 Click Score: 18    End of Session   Activity Tolerance: Patient tolerated treatment well;Patient limited by fatigue;Patient limited by pain Patient left: in chair;with call bell/phone within reach;with family/visitor present Nurse Communication: Mobility status PT Visit Diagnosis: Unsteadiness on feet (R26.81);Other abnormalities of gait and mobility (R26.89);Muscle weakness (generalized) (M62.81)     Time: 0258-5277 PT Time Calculation (min) (ACUTE ONLY): 28 min  Charges:  $Therapeutic Exercise: 8-22 mins $Therapeutic Activity: 8-22 mins                     10:27 AM,08/09/20 Domenic Moras, PT, DPT Physical Therapist at [facility]

## 2020-08-09 NOTE — Progress Notes (Signed)
Physical Therapy Treatment Patient Details Name: Victor [name] MRN: [id number] DOB: 12/09/1975 Today's Date: 08/09/2020  RIGHT KNEE ROM: 10-60 degrees AMBULATION DISTANCE: 300 feet with RW and supervision     History of Present Illness Victor [name] is a 44 y/o male s/p REVISION OF RIGHT TOTAL KNEE  TIBIAL COMPONENTS ONLY (Right) 08/08/20, with the diagnosis of right total knee replacement arthrofibrosis.    PT Comments    Patient present in room with wife who left after PT came in. Agreed to work with PT. Stated 1 hr ago he and his wife ambulated from his room around the nurses station and back.  Knee ROM 10-60 degrees, increased difficulty with pain throughout. Ambulation with PT demo cont difficulty with active knee extension during ambulation and reported increased pain and cramping in R hamstring and quad.  Patient will benefit from continued physical therapy in hospital and recommended venue below to increase strength, balance, endurance for safe ADLs and gait.   Follow Up Recommendations  Home health PT;Supervision for mobility/OOB;Supervision - Intermittent     Equipment Recommendations  None recommended by PT    Recommendations for Other Services       Precautions / Restrictions Precautions Precautions: Fall Restrictions Weight Bearing Restrictions: Yes RLE Weight Bearing: Weight bearing as tolerated LLE Weight Bearing: Weight bearing as tolerated    Mobility  Bed Mobility Overal bed mobility: Modified Independent Bed Mobility: Supine to Sit;Sit to Supine     Supine to sit: Modified independent (Device/Increase time);Supervision Sit to supine: Modified independent (Device/Increase time);Supervision   General bed mobility comments: increased time, labored movement, move RLE using LLE to lift  Transfers Overall transfer level: Modified independent Equipment used: Rolling walker (2 wheeled) Transfers: Sit to/from [facility] Sit to Stand: Min  guard;Supervision Stand pivot transfers: Min guard;Supervision       General transfer comment: increased time, labored movement, increase in pain  Ambulation/Gait Ambulation/Gait assistance: Supervision;Modified independent (Device/Increase time) Gait Distance (Feet): 350 Feet Assistive device: Rolling walker (2 wheeled)   Gait velocity: decreased   General Gait Details: slow/labored movement, increased difficulty with WB on RLE, steady movement of RW approx 5-10 steps before reverting back to step to, increased WB through UE during RLE WB   Stairs             Wheelchair Mobility    Modified Rankin (Stroke Patients Only)       Balance Overall balance assessment: Modified Independent Sitting-balance support: Feet supported;No upper extremity supported Sitting balance-Leahy Scale: Good Sitting balance - Comments: seated at EOB   Standing balance support: During functional activity;Bilateral upper extremity supported Standing balance-Leahy Scale: Fair Standing balance comment: using RW                            Cognition Arousal/Alertness: Awake/alert Behavior During Therapy: WFL for tasks assessed/performed Overall Cognitive Status: Within Functional Limits for tasks assessed                                        Exercises Total Joint Exercises Ankle Circles/Pumps: AROM;Both;10 reps;Supine Quad Sets: AROM;Strengthening;Both;15 reps;Supine Gluteal Sets: AROM;Strengthening;Both;15 reps;Supine Heel Slides: AROM;Right;10 reps;Seated Goniometric ROM: 10-60    General Comments        Pertinent Vitals/Pain Pain Assessment: 0-10 Pain Score: 7  Faces Pain Scale: Hurts even more Pain Location: right knee with  movement, progress to quad and hamstring pain/tightness Pain Descriptors / Indicators: Aching;Sore;Grimacing;Guarding Pain Intervention(s): Limited activity within patient's tolerance;Monitored during  session;Repositioned;Relaxation    Home Living                      Prior Function            PT Goals (current goals can now be found in the care plan section) Acute Rehab PT Goals Patient Stated Goal: return home with family to assist PT Goal Formulation: With patient Time For Goal Achievement: 08/11/20 Potential to Achieve Goals: Good Progress towards PT goals: Progressing toward goals    Frequency    BID      PT Plan Current plan remains appropriate    Co-evaluation              AM-PAC PT "6 Clicks" Mobility   Outcome Measure  Help needed turning from your back to your side while in a flat bed without using bedrails?: A Little Help needed moving from lying on your back to sitting on the side of a flat bed without using bedrails?: A Little Help needed moving to and from a bed to a chair (including a wheelchair)?: A Little Help needed standing up from a chair using your arms (e.g., wheelchair or bedside chair)?: A Little Help needed to walk in hospital room?: A Little Help needed climbing 3-5 steps with a railing? : A Little 6 Click Score: 18    End of Session   Activity Tolerance: Patient tolerated treatment well;Patient limited by fatigue;Patient limited by pain Patient left: in bed;with call bell/phone within reach Nurse Communication: Mobility status PT Visit Diagnosis: Unsteadiness on feet (R26.81);Other abnormalities of gait and mobility (R26.89);Muscle weakness (generalized) (M62.81)     Time: 1610-9604 PT Time Calculation (min) (ACUTE ONLY): 27 min  Charges:  $Gait Training: 8-22 mins $Therapeutic Activity: 8-22 mins                     3:59 PM,08/09/20 Domenic Moras, PT, DPT Physical Therapist at [facility]

## 2020-08-10 NOTE — Progress Notes (Signed)
Physical Therapy Treatment Patient Details Name: Victor [name] MRN: [id number] DOB: 1976-05-19 Today's Date: 08/10/2020   RIGHT KNEE ROM: set in CPM from 0-70 degrees AMBULATION DISTANCE: 350 feet with RW and Modified Independent-Supervision   History of Present Illness Victor [name] is a 44 y/o male s/p REVISION OF RIGHT TOTAL KNEE  TIBIAL COMPONENTS ONLY (Right) 08/08/20, with the diagnosis of right total knee replacement arthrofibrosis.    PT Comments    Patient present in room in bed and agreed to PT. Completed Knee activation exercises detailed below, increased difficulty with short arc quads secondary to decreased quad activation and increased swelling. Sit to/from stand transfers completed throughout session with supervision to modified independent, but demos decreased WB on RLE and requires use of RW to assist.  Demo improved WB on RLE during ambulation with RW and decreased support provided by PT, although pt cont demo increased fatigue and pain after ambulating approx 150-200 ft. Set up CPM machine per MD note, and explained set up to patient and wife. ROM set from 0-70d when PT left room with patient and wife in room.  Nurse notified of position and use of CPM. Patient will benefit from continued physical therapy in hospital and recommended venue below to increase strength, balance, endurance for safe ADLs and gait.   Follow Up Recommendations  Home health PT;Supervision for mobility/OOB;Supervision - Intermittent     Equipment Recommendations  None recommended by PT    Recommendations for Other Services       Precautions / Restrictions Precautions Precautions: Fall Restrictions Weight Bearing Restrictions: Yes RLE Weight Bearing: Weight bearing as tolerated LLE Weight Bearing: Weight bearing as tolerated    Mobility  Bed Mobility Overal bed mobility: Modified Independent Bed Mobility: Supine to Sit;Sit to Supine     Supine to sit: Modified independent  (Device/Increase time);Supervision Sit to supine: Modified independent (Device/Increase time);Supervision   General bed mobility comments: increased time, labored movement, move RLE using LLE to lift  Transfers Overall transfer level: Modified independent Equipment used: Rolling walker (2 wheeled) Transfers: Sit to/from [facility] Sit to Stand: Modified independent (Device/Increase time);Supervision Stand pivot transfers: Modified independent (Device/Increase time);Supervision       General transfer comment: increased time, labored movement, increase in pain  Ambulation/Gait Ambulation/Gait assistance: Modified independent (Device/Increase time) Gait Distance (Feet): 350 Feet Assistive device: Rolling walker (2 wheeled) Gait Pattern/deviations: Decreased step length - right;Decreased stance time - right;Decreased stride length;Antalgic;Step-through pattern Gait velocity: decreased   General Gait Details: slow/labored movement, increased pain with WB on RLE, decreased terminal knee extension and passive knee flexion on R   Stairs             Wheelchair Mobility    Modified Rankin (Stroke Patients Only)       Balance Overall balance assessment: Modified Independent Sitting-balance support: Feet supported;No upper extremity supported Sitting balance-Leahy Scale: Good Sitting balance - Comments: seated at EOB   Standing balance support: During functional activity;Bilateral upper extremity supported Standing balance-Leahy Scale: Fair Standing balance comment: using RW                            Cognition Arousal/Alertness: Awake/alert Behavior During Therapy: WFL for tasks assessed/performed Overall Cognitive Status: Within Functional Limits for tasks assessed  Exercises Total Joint Exercises Ankle Circles/Pumps: AROM;Both;10 reps;Supine Quad Sets:  AROM;Strengthening;Both;Supine;10 reps Gluteal Sets: AROM;Strengthening;Both;15 reps;Supine Short Arc Quad: AROM;Right;10 reps;Supine Goniometric ROM: set up CPM to 0-70d, pt reports MD measured to 90d flexion    General Comments        Pertinent Vitals/Pain Pain Assessment: 0-10 Pain Score: 7  Faces Pain Scale: Hurts whole lot Pain Location: right knee with movement, upper leg anterior and posterior Pain Descriptors / Indicators: Aching;Sore;Grimacing;Guarding Pain Intervention(s): Limited activity within patient's tolerance;Monitored during session;Repositioned;Relaxation;Premedicated before session    Home Living                      Prior Function            PT Goals (current goals can now be found in the care plan section) Acute Rehab PT Goals Patient Stated Goal: return home with family to assist PT Goal Formulation: With patient Time For Goal Achievement: 08/11/20 Potential to Achieve Goals: Good Progress towards PT goals: Progressing toward goals    Frequency    BID      PT Plan Current plan remains appropriate    Co-evaluation              AM-PAC PT "6 Clicks" Mobility   Outcome Measure  Help needed turning from your back to your side while in a flat bed without using bedrails?: A Little Help needed moving from lying on your back to sitting on the side of a flat bed without using bedrails?: A Little Help needed moving to and from a bed to a chair (including a wheelchair)?: A Little Help needed standing up from a chair using your arms (e.g., wheelchair or bedside chair)?: A Little Help needed to walk in hospital room?: A Little Help needed climbing 3-5 steps with a railing? : A Little 6 Click Score: 18    End of Session   Activity Tolerance: Patient tolerated treatment well;Patient limited by fatigue;Patient limited by pain Patient left: in bed;with call bell/phone within reach;in CPM;with family/visitor present Nurse Communication:  Mobility status PT Visit Diagnosis: Unsteadiness on feet (R26.81);Other abnormalities of gait and mobility (R26.89);Muscle weakness (generalized) (M62.81)     Time: 9672-8979 PT Time Calculation (min) (ACUTE ONLY): 45 min  Charges:  $Gait Training: 8-22 mins $Therapeutic Activity: 8-22 mins $Neuromuscular Re-education: 8-22 mins                     9:33 AM,08/10/20 Domenic Moras, PT, DPT Physical Therapist at [facility]

## 2020-08-10 NOTE — Progress Notes (Signed)
Patient ID: Victor [name], male   DOB: 1975/11/17, 44 y.o.   MRN: [id number] Second postop day status post revision right total knee tibial component  BP 133/85 (BP Location: Left Arm)   Pulse 68   Temp (!) 97.5 F (36.4 C)   Resp 18   Ht 5\' 11"  (1.803 m)   Wt 109 kg   SpO2 94%   BMI 33.52 kg/m   Pain seems well managed at rest with oxycodone  Dressing changed  Minimal swelling  Clean wound  I did some therapy with the patient today although I was able to get the knee to 90 degrees of flexion  He still has a significant extensor lag  Recommend patient stay in the hospital additional 1 to 2 days so that he can get twice a day therapy and start his CPM machine

## 2020-08-10 NOTE — Progress Notes (Signed)
Physical Therapy Treatment Patient Details Name: Victor [name] MRN: [id number] DOB: 1975-10-30 Today's Date: 08/10/2020  RIGHT KNEE ROM: 0-85 degrees in CPM AMBULATION DISTANCE: 200 feet with RW and supervision     History of Present Illness Victor [name] is a 44 y/o male s/p REVISION OF RIGHT TOTAL KNEE  TIBIAL COMPONENTS ONLY (Right) 08/08/20, with the diagnosis of right total knee replacement arthrofibrosis.    PT Comments    Patient present in room with wife with CPM machine on moving from 0-85d. Patient and wife educated on different hamstring and quad stretches. Sit to/from stand transfers with supervision throughout session. Decreased ambulation distance vs this morning secondary to increased time in CPM and increase in pain. Demo improved heel strike and stance time on RLE during ambulation.  Cont difficulty with steady smooth movements with RW, preferring step to pattern and verbalizing increased pain and muscle tightness/cramps when complete step through pattern with RW moving consistently. Nurse notified leaving pt in bed. Pt requested a break from the CPM and PT stated she would come back at 3:30 to set him back up, when PT came back, he requested his wife to set him back up and promised to complete. Patient will benefit from continued physical therapy in hospital and recommended venue below to increase strength, balance, endurance for safe ADLs and gait.   Follow Up Recommendations  Home health PT;Supervision for mobility/OOB;Supervision - Intermittent     Equipment Recommendations  None recommended by PT    Recommendations for Other Services       Precautions / Restrictions Precautions Precautions: Fall Restrictions Weight Bearing Restrictions: Yes RLE Weight Bearing: Weight bearing as tolerated LLE Weight Bearing: Weight bearing as tolerated    Mobility  Bed Mobility Overal bed mobility: Modified Independent Bed Mobility: Supine to Sit;Sit to Supine     Supine  to sit: Modified independent (Device/Increase time) Sit to supine: Modified independent (Device/Increase time)   General bed mobility comments: increased time, labored movement, move RLE using LLE to lift  Transfers Overall transfer level: Modified independent Equipment used: Rolling walker (2 wheeled) Transfers: Sit to/from [facility] Sit to Stand: Modified independent (Device/Increase time);Supervision Stand pivot transfers: Modified independent (Device/Increase time);Supervision       General transfer comment: increased time, labored movement, increase in pain  Ambulation/Gait Ambulation/Gait assistance: Modified independent (Device/Increase time) Gait Distance (Feet): 200 Feet Assistive device: Rolling walker (2 wheeled) Gait Pattern/deviations: Decreased step length - right;Decreased stance time - right;Decreased stride length;Antalgic;Step-through pattern Gait velocity: decreased   General Gait Details: slow/labored movement, increased pain with WB on RLE, decreased terminal knee extension and passive knee flexion on R   Stairs             Wheelchair Mobility    Modified Rankin (Stroke Patients Only)       Balance Overall balance assessment: Modified Independent Sitting-balance support: Feet supported;No upper extremity supported Sitting balance-Leahy Scale: Good Sitting balance - Comments: seated at EOB   Standing balance support: During functional activity;Bilateral upper extremity supported Standing balance-Leahy Scale: Fair Standing balance comment: using RW                            Cognition Arousal/Alertness: Awake/alert Behavior During Therapy: WFL for tasks assessed/performed Overall Cognitive Status: Within Functional Limits for tasks assessed  Exercises Total Joint Exercises Ankle Circles/Pumps: AROM;Both;10 reps;Supine Gluteal Sets:  AROM;Strengthening;Both;15 reps;Supine Heel Slides: AROM;Right;10 reps;Standing Goniometric ROM: CPM set from 0-85d    General Comments        Pertinent Vitals/Pain Pain Assessment: Faces Pain Score: 7  Faces Pain Scale: Hurts whole lot Pain Location: right knee with movement, upper leg anterior and posterior Pain Descriptors / Indicators: Aching;Sore;Grimacing;Guarding Pain Intervention(s): Limited activity within patient's tolerance;RN gave pain meds during session;Monitored during session;Repositioned;Relaxation    Home Living                      Prior Function            PT Goals (current goals can now be found in the care plan section) Acute Rehab PT Goals Patient Stated Goal: return home with family to assist PT Goal Formulation: With patient Time For Goal Achievement: 08/11/20 Potential to Achieve Goals: Good Progress towards PT goals: Progressing toward goals    Frequency    BID      PT Plan Current plan remains appropriate    Co-evaluation              AM-PAC PT "6 Clicks" Mobility   Outcome Measure  Help needed turning from your back to your side while in a flat bed without using bedrails?: A Little Help needed moving from lying on your back to sitting on the side of a flat bed without using bedrails?: A Little Help needed moving to and from a bed to a chair (including a wheelchair)?: A Little Help needed standing up from a chair using your arms (e.g., wheelchair or bedside chair)?: A Little Help needed to walk in hospital room?: A Little Help needed climbing 3-5 steps with a railing? : A Little 6 Click Score: 18    End of Session   Activity Tolerance: Patient tolerated treatment well;Patient limited by fatigue;Patient limited by pain Patient left: in bed;with call bell/phone within reach;with family/visitor present Nurse Communication: Mobility status PT Visit Diagnosis: Unsteadiness on feet (R26.81);Other abnormalities of gait and  mobility (R26.89);Muscle weakness (generalized) (M62.81)     Time: 2671-2458 PT Time Calculation (min) (ACUTE ONLY): 18 min  Charges:  $Therapeutic Activity: 8-22 mins                     3:59 PM,08/10/20 Domenic Moras, PT, DPT Physical Therapist at [facility]

## 2020-08-11 ENCOUNTER — Other Ambulatory Visit: Payer: Self-pay | Admitting: Orthopedic Surgery

## 2020-08-11 MED ORDER — OXYCODONE HCL 5 MG PO TABS: 5.0000 mg | ORAL_TABLET | ORAL | 0 refills | Status: AC

## 2020-08-11 MED ORDER — OXYCODONE HCL ER 10 MG PO T12A: 10.0000 mg | EXTENDED_RELEASE_TABLET | Freq: Two times a day (BID) | ORAL | 0 refills | Status: DC

## 2020-08-11 MED ORDER — PREGABALIN 50 MG PO CAPS: 50.0000 mg | ORAL_CAPSULE | Freq: Three times a day (TID) | ORAL | 2 refills | Status: DC

## 2020-08-11 MED ORDER — OXYCODONE HCL 5 MG PO TABS: 5.0000 mg | ORAL_TABLET | ORAL | 0 refills | Status: DC

## 2020-08-11 MED ORDER — OXYCODONE HCL ER 10 MG PO T12A: 10.0000 mg | EXTENDED_RELEASE_TABLET | Freq: Two times a day (BID) | ORAL | 0 refills | Status: AC

## 2020-08-11 MED ORDER — DOCUSATE SODIUM 100 MG PO CAPS: 100.0000 mg | ORAL_CAPSULE | Freq: Two times a day (BID) | ORAL | 0 refills | Status: DC

## 2020-08-11 MED ORDER — TRAMADOL HCL 50 MG PO TABS: 50.0000 mg | ORAL_TABLET | Freq: Four times a day (QID) | ORAL | 0 refills | Status: AC

## 2020-08-11 MED ORDER — POLYETHYLENE GLYCOL 3350 17 G PO PACK: 17.0000 g | PACK | Freq: Every day | ORAL | 0 refills | Status: DC

## 2020-08-11 MED ORDER — ASPIRIN 325 MG PO TBEC: 325.0000 mg | DELAYED_RELEASE_TABLET | Freq: Every day | ORAL | 0 refills | Status: DC

## 2020-08-11 MED ORDER — METHOCARBAMOL 500 MG PO TABS: 500.0000 mg | ORAL_TABLET | Freq: Four times a day (QID) | ORAL | 0 refills | Status: DC | PRN

## 2020-08-11 NOTE — Progress Notes (Signed)
Meds ordered this encounter  Medications  . oxyCODONE (OXYCONTIN) 10 mg 12 hr tablet    Sig: Take 1 tablet (10 mg total) by mouth every 12 (twelve) hours for 7 days.    Dispense:  14 tablet    Refill:  0

## 2020-08-11 NOTE — Addendum Note (Signed)
Addended byCandice Camp on: 08/11/2020 11:58 AM   Modules accepted: Orders

## 2020-08-11 NOTE — Progress Notes (Signed)
Physical Therapy Treatment Patient Details Name: Victor [name] MRN: [id number] DOB: 1976-02-20 Today's Date: 08/11/2020  RIGHTKNEE ROM: lacking 12 - 60 initially, lacking 10-85 at end of session AMBULATION DISTANCE: 250 feet with RW, and stairs with step to pattern   History of Present Illness Victor [name] is a 44 y/o male s/p REVISION OF RIGHT TOTAL KNEE  TIBIAL COMPONENTS ONLY (Right) 08/08/20, with the diagnosis of right total knee replacement arthrofibrosis.    PT Comments    Patient ROM from lacking 12-60 at beginning of session and progresses to lacking 10-85 at end of session in supine. Patient completes heel slides in seated EOB today with cueing for overpressure from LLE. Patient does not require physical assist with all mobility today. Patient ambulates with improving step through pattern today with min cueing for gait mechanics. Patient completes stairs with step to pattern and requires some cueing for stair navigation with heavy UE use on rails. Patient returns to room and is educated on importance of exercises to improve strength and mobility. Patient showing significant extensor lag requiring use of LLE to assist. Patient requries verbal and tactile cueing for completion of quad set. Patient shown proper use of bone foam for improving knee extension ROM. Educated patient and his wife are educated frequent completion of quad sets, heel slides in supine and seated, hamstring sets in supine and seated, use of bone foam for extension, HHPT and OPPT. Patient will benefit from continued physical therapy in hospital and recommended venue below to increase strength, balance, endurance for safe ADLs and gait.   Follow Up Recommendations  Home health PT;Supervision for mobility/OOB;Supervision - Intermittent     Equipment Recommendations  None recommended by PT    Recommendations for Other Services       Precautions / Restrictions Precautions Precautions: Fall Restrictions Weight  Bearing Restrictions: Yes RLE Weight Bearing: Weight bearing as tolerated    Mobility  Bed Mobility Overal bed mobility: Modified Independent Bed Mobility: Supine to Sit;Sit to Supine     Supine to sit: Modified independent (Device/Increase time) Sit to supine: Modified independent (Device/Increase time)   General bed mobility comments: increased time, labored movement, move RLE using LLE to lift  Transfers Overall transfer level: Modified independent Equipment used: Rolling walker (2 wheeled) Transfers: Sit to/from [facility] Sit to Stand: Modified independent (Device/Increase time) Stand pivot transfers: Modified independent (Device/Increase time)       General transfer comment: increased time, labored movement, increase in pain, relies on LLE  Ambulation/Gait Ambulation/Gait assistance: Modified independent (Device/Increase time) Gait Distance (Feet): 250 Feet Assistive device: Rolling walker (2 wheeled) Gait Pattern/deviations: Decreased step length - right;Decreased stance time - right;Decreased stride length;Antalgic;Step-through pattern Gait velocity: decreased   General Gait Details: slow/labored movement, increased pain with WB on RLE, decreased terminal knee extension and passive knee flexion on R   Stairs   Stairs assistance: Min guard;Min assist Stair Management: One rail Left;Step to pattern;Forwards Number of Stairs: 3 General stair comments: labored and slow movements throughout, heavy UE use   Wheelchair Mobility    Modified Rankin (Stroke Patients Only)       Balance Overall balance assessment: Modified Independent Sitting-balance support: Feet supported;No upper extremity supported Sitting balance-Leahy Scale: Good Sitting balance - Comments: seated at EOB   Standing balance support: During functional activity;Bilateral upper extremity supported Standing balance-Leahy Scale: Fair Standing balance comment: using RW  Cognition Arousal/Alertness: Awake/alert Behavior During Therapy: WFL for tasks assessed/performed Overall Cognitive Status: Within Functional Limits for tasks assessed                                        Exercises Total Joint Exercises Ankle Circles/Pumps: AROM;Both;10 reps;Supine Quad Sets: AROM;Strengthening;Both;Supine;10 reps (5-10 second holds) Heel Slides: AROM;Right;10 reps;Supine;Seated (with overpressure) Knee Flexion: AROM;Supine;Right;10 reps Goniometric ROM: lacking 12- 60 initially, lacking 10-85 at end of session    General Comments        Pertinent Vitals/Pain Pain Assessment: 0-10 Pain Score: 6  Pain Descriptors / Indicators: Aching;Sore;Grimacing;Guarding Pain Intervention(s): Limited activity within patient's tolerance;Monitored during session;Repositioned;Premedicated before session    Home Living                      Prior Function            PT Goals (current goals can now be found in the care plan section) Acute Rehab PT Goals Patient Stated Goal: return home with family to assist PT Goal Formulation: With patient Time For Goal Achievement: 08/14/20 Potential to Achieve Goals: Good Progress towards PT goals: Progressing toward goals    Frequency    BID      PT Plan Current plan remains appropriate    Co-evaluation              AM-PAC PT "6 Clicks" Mobility   Outcome Measure  Help needed turning from your back to your side while in a flat bed without using bedrails?: None Help needed moving from lying on your back to sitting on the side of a flat bed without using bedrails?: None Help needed moving to and from a bed to a chair (including a wheelchair)?: A Little Help needed standing up from a chair using your arms (e.g., wheelchair or bedside chair)?: A Little Help needed to walk in hospital room?: A Little Help needed climbing 3-5 steps with a railing? : A Little 6 Click  Score: 20    End of Session Equipment Utilized During Treatment: Gait belt Activity Tolerance: Patient tolerated treatment well;Patient limited by fatigue;Patient limited by pain Patient left: in bed;with call bell/phone within reach;with family/visitor present Nurse Communication: Mobility status PT Visit Diagnosis: Unsteadiness on feet (R26.81);Other abnormalities of gait and mobility (R26.89);Muscle weakness (generalized) (M62.81)     Time: 5277-8242 PT Time Calculation (min) (ACUTE ONLY): 36 min  Charges:  $Therapeutic Exercise: 8-22 mins $Therapeutic Activity: 8-22 mins                     9:30 AM, 08/11/20 Mearl Latin PT, DPT Physical Therapist at [facility]

## 2020-08-11 NOTE — Discharge Summary (Addendum)
Physician Discharge Summary  Patient ID: Steadman Prosperi Wadding MRN: [id number] DOB/AGE: [birth year] 44 y.o.  Admit date: 08/08/2020 Discharge date: 08/11/2020  Admission Diagnoses: Arthrofibrosis right knee status post right total knee  Discharge Diagnoses: Same  Discharged Condition: stable  Procedure: Revision right total knee tibial component, attune rotating platform revision tibial component size 8 tibia size 12 polyethylene insert  Hospital Course:  44 year old male underwent right total knee revision of tibial component due to arthrofibrosis.  Patient tolerated procedure well postoperatively participate in the physical therapy program which included range of motion exercises CPM machine and gait training  Pain was controlled with oxycodone every 12 and intermittent 5 mg as needed along with tramadol and Lyrica  At the time of discharge patient's range of motion is 10-90.  CPM is up to 90   Discharge Exam: BP 125/85 (BP Location: Left Arm)   Pulse 69   Temp 98.4 F (36.9 C) (Oral)   Resp 18   Ht 5\' 11"  (1.803 m)   Wt 109 kg   SpO2 97%   BMI 33.52 kg/m  Physical Exam He is awake and alert he is oriented he has no respiratory depression Incision is clean with scant sanguinous drainage Calf is supple Gait is supported by walker with a slight slow cadence but very good progress Again range of motion is 10-90, extension deficit secondary to hamstring tightness and spasm which is persisted from his first surgery throughout the surgery, also thought to be related to lumbar disc pathology.   Disposition: Discharge disposition: 01-Home or Self Care       Discharge Instructions    Call MD / Call 911   Complete by: As directed    If you experience chest pain or shortness of breath, CALL 911 and be transported to the hospital emergency room.  If you develope a fever above 101 F, pus (white drainage) or increased drainage or redness at the wound, or calf pain, call your  surgeon's office.   Constipation Prevention   Complete by: As directed    Drink plenty of fluids.  Prune juice Caffey be helpful.  You [place] use a stool softener, such as Colace (over the counter) 100 mg twice a day.  Use MiraLax (over the counter) for constipation as needed.   Diet - low sodium heart healthy   Complete by: As directed    Discharge instructions   Complete by: As directed    Ice 6 x a day   Home exercises daily  Bone foam 4 x a day for 30 min   Cpm 6 hrs a day increase as tolerated   Brace at night   Increase activity slowly as tolerated   Complete by: As directed      Allergies as of 08/11/2020   No Known Allergies     Medication List    STOP taking these medications   atorvastatin 80 MG tablet Commonly known as: LIPITOR     TAKE these medications   amLODipine 10 MG tablet Commonly known as: NORVASC Take 1 tablet (10 mg total) by mouth daily.   aspirin 325 MG EC tablet Take 1 tablet (325 mg total) by mouth daily with breakfast. Start taking on: August 12, 2020   docusate sodium 100 MG capsule Commonly known as: COLACE Take 1 capsule (100 mg total) by mouth 2 (two) times daily.   methocarbamol 500 MG tablet Commonly known as: ROBAXIN Take 1 tablet (500 mg total) by mouth every 6 (six) hours as  needed for muscle spasms.   oxyCODONE 5 MG immediate release tablet Commonly known as: Oxy IR/ROXICODONE Take 1 tablet (5 mg total) by mouth every 4 (four) hours for 7 days.   oxyCODONE 10 mg 12 hr tablet Commonly known as: OXYCONTIN Take 1 tablet (10 mg total) by mouth every 12 (twelve) hours for 7 days.   polyethylene glycol 17 g packet Commonly known as: MIRALAX / GLYCOLAX Take 17 g by mouth daily. Start taking on: August 12, 2020   pregabalin 50 MG capsule Commonly known as: LYRICA Take 1 capsule (50 mg total) by mouth 3 (three) times daily.   traMADol 50 MG tablet Commonly known as: ULTRAM Take 1 tablet (50 mg total) by mouth every 6 (six)  hours for 7 days.       Follow-up Information    [name], MD Follow up on 08/23/2020.   Specialties: Orthopedic Surgery, Radiology Contact information: [address] [place] [address] [phone]               Signed: Arther Abbott 08/11/2020, 9:57 AM

## 2020-08-12 DIAGNOSIS — Z8711 Personal history of peptic ulcer disease: Secondary | ICD-10-CM | POA: Diagnosis not present

## 2020-08-12 DIAGNOSIS — E785 Hyperlipidemia, unspecified: Secondary | ICD-10-CM | POA: Diagnosis not present

## 2020-08-12 DIAGNOSIS — Z87891 Personal history of nicotine dependence: Secondary | ICD-10-CM | POA: Diagnosis not present

## 2020-08-12 DIAGNOSIS — Z7982 Long term (current) use of aspirin: Secondary | ICD-10-CM | POA: Diagnosis not present

## 2020-08-12 DIAGNOSIS — K219 Gastro-esophageal reflux disease without esophagitis: Secondary | ICD-10-CM | POA: Diagnosis not present

## 2020-08-12 DIAGNOSIS — I10 Essential (primary) hypertension: Secondary | ICD-10-CM | POA: Diagnosis not present

## 2020-08-12 DIAGNOSIS — T84012D Broken internal right knee prosthesis, subsequent encounter: Secondary | ICD-10-CM | POA: Diagnosis not present

## 2020-08-14 ENCOUNTER — Telehealth: Payer: Self-pay | Admitting: Radiology

## 2020-08-14 ENCOUNTER — Telehealth: Payer: Medicaid Other | Admitting: Orthopedic Surgery

## 2020-08-14 NOTE — Telephone Encounter (Signed)
I called and spoke with patient, I see Victor [name] sent you a message earlier.  Patient took last slow release 10 mg Oxy this AM.  Asks if you are able to advise on this. Thanks.

## 2020-08-14 NOTE — Telephone Encounter (Signed)
Patient/designated contact on file, wife Hinton Dyer, called to inquire about status of medication; states will be out of medication today; he is status/post surgery 08/08/20, revision procedure.  Please review and advise.

## 2020-08-14 NOTE — Telephone Encounter (Signed)
FYI Medicaid did not cover the Oxycontin they purchased enough for the weekend, will run out today  The covered long acting analgesics with Medicaid are Fentanyl patch, Methadone and MS Contin.

## 2020-08-15 ENCOUNTER — Other Ambulatory Visit: Payer: Self-pay | Admitting: Orthopedic Surgery

## 2020-08-15 DIAGNOSIS — Z87891 Personal history of nicotine dependence: Secondary | ICD-10-CM | POA: Diagnosis not present

## 2020-08-15 DIAGNOSIS — T84012D Broken internal right knee prosthesis, subsequent encounter: Secondary | ICD-10-CM | POA: Diagnosis not present

## 2020-08-15 DIAGNOSIS — E785 Hyperlipidemia, unspecified: Secondary | ICD-10-CM | POA: Diagnosis not present

## 2020-08-15 DIAGNOSIS — Z8711 Personal history of peptic ulcer disease: Secondary | ICD-10-CM | POA: Diagnosis not present

## 2020-08-15 DIAGNOSIS — K219 Gastro-esophageal reflux disease without esophagitis: Secondary | ICD-10-CM | POA: Diagnosis not present

## 2020-08-15 DIAGNOSIS — Z7982 Long term (current) use of aspirin: Secondary | ICD-10-CM | POA: Diagnosis not present

## 2020-08-15 DIAGNOSIS — I10 Essential (primary) hypertension: Secondary | ICD-10-CM | POA: Diagnosis not present

## 2020-08-15 NOTE — Telephone Encounter (Signed)
Its ok   Will not need after 1st week post op

## 2020-08-15 NOTE — Telephone Encounter (Signed)
He could only afford to pay for part of the Oxycontin Rx it ran out yesterday  He has oxycodone 5 and Tramadol

## 2020-08-15 NOTE — Telephone Encounter (Signed)
Patient/spouse Victor [name], designated contact, has called back to follow up on status. States understands it [place] not be the same medication, but whatever Dr Aline Brochure can prescribe is fine. Asking for a call back to [phone] or [phone]

## 2020-08-15 NOTE — Telephone Encounter (Signed)
They couldn't get the full week supply Only had enough to get through yesterday, is out of meds now.

## 2020-08-15 NOTE — Telephone Encounter (Signed)
Dose: 5 mg Route: Oral Frequency: Every 4 hours  Dispense Quantity: 42 tablet Refills: 0        Sig: Take 1 tablet (5 mg total) by mouth every 4 (four) hours for 7 days.       Start Date: 08/11/20 End Date: 08/18/20 after 42 doses  Written Date: 08/11/20 Expiration Date: 02/07/21  Earliest Fill Date: 08/11/20    Original Order:  oxyCODONE (OXY IR/ROXICODONE) 5 MG immediate release tablet [[id number]]   PLUS TRAMADOL   SHOULD BE ENOUGH   IF HE GOT PART OF THE RX THEN HE SHOULD BE ABLE TO GET THE REST   I M CONFUSED

## 2020-08-15 NOTE — Telephone Encounter (Signed)
Not sure how to proceed I got a denial from medicaid regarding the Oxycontin, preferred drugs are MS contin and Duragesic patch

## 2020-08-17 DIAGNOSIS — Z8711 Personal history of peptic ulcer disease: Secondary | ICD-10-CM | POA: Diagnosis not present

## 2020-08-17 DIAGNOSIS — I10 Essential (primary) hypertension: Secondary | ICD-10-CM | POA: Diagnosis not present

## 2020-08-17 DIAGNOSIS — Z87891 Personal history of nicotine dependence: Secondary | ICD-10-CM | POA: Diagnosis not present

## 2020-08-17 DIAGNOSIS — Z7982 Long term (current) use of aspirin: Secondary | ICD-10-CM | POA: Diagnosis not present

## 2020-08-17 DIAGNOSIS — T84012D Broken internal right knee prosthesis, subsequent encounter: Secondary | ICD-10-CM | POA: Diagnosis not present

## 2020-08-17 DIAGNOSIS — K219 Gastro-esophageal reflux disease without esophagitis: Secondary | ICD-10-CM | POA: Diagnosis not present

## 2020-08-17 DIAGNOSIS — E785 Hyperlipidemia, unspecified: Secondary | ICD-10-CM | POA: Diagnosis not present

## 2020-08-17 LAB — TYPE AND SCREEN
ABO/RH(D): A NEG
Antibody Screen: NEGATIVE
Unit division: 0
Unit division: 0

## 2020-08-17 LAB — BPAM RBC
Blood Product Expiration Date: 202201082359
Blood Product Expiration Date: 202201092359
Unit Type and Rh: 600
Unit Type and Rh: 600

## 2020-08-20 DIAGNOSIS — H5213 Myopia, bilateral: Secondary | ICD-10-CM | POA: Diagnosis not present

## 2020-08-22 ENCOUNTER — Other Ambulatory Visit: Payer: Self-pay | Admitting: Orthopedic Surgery

## 2020-08-22 MED ORDER — PREGABALIN 50 MG PO CAPS: 50.0000 mg | ORAL_CAPSULE | Freq: Three times a day (TID) | ORAL | 2 refills | Status: DC

## 2020-08-22 MED ORDER — METHOCARBAMOL 500 MG PO TABS: 500.0000 mg | ORAL_TABLET | Freq: Four times a day (QID) | ORAL | 0 refills | Status: DC | PRN

## 2020-08-23 ENCOUNTER — Other Ambulatory Visit: Payer: Self-pay

## 2020-08-23 ENCOUNTER — Encounter: Payer: Self-pay | Admitting: Orthopedic Surgery

## 2020-08-23 ENCOUNTER — Ambulatory Visit (INDEPENDENT_AMBULATORY_CARE_PROVIDER_SITE_OTHER): Payer: Medicaid Other | Admitting: Orthopedic Surgery

## 2020-08-23 DIAGNOSIS — K219 Gastro-esophageal reflux disease without esophagitis: Secondary | ICD-10-CM | POA: Diagnosis not present

## 2020-08-23 DIAGNOSIS — Z96651 Presence of right artificial knee joint: Secondary | ICD-10-CM

## 2020-08-23 DIAGNOSIS — T84012D Broken internal right knee prosthesis, subsequent encounter: Secondary | ICD-10-CM | POA: Diagnosis not present

## 2020-08-23 DIAGNOSIS — E785 Hyperlipidemia, unspecified: Secondary | ICD-10-CM | POA: Diagnosis not present

## 2020-08-23 DIAGNOSIS — I10 Essential (primary) hypertension: Secondary | ICD-10-CM | POA: Diagnosis not present

## 2020-08-23 DIAGNOSIS — Z87891 Personal history of nicotine dependence: Secondary | ICD-10-CM | POA: Diagnosis not present

## 2020-08-23 DIAGNOSIS — Z7982 Long term (current) use of aspirin: Secondary | ICD-10-CM | POA: Diagnosis not present

## 2020-08-23 DIAGNOSIS — Z8711 Personal history of peptic ulcer disease: Secondary | ICD-10-CM | POA: Diagnosis not present

## 2020-08-23 MED ORDER — HYDROCODONE-ACETAMINOPHEN 10-325 MG PO TABS: 1.0000 | ORAL_TABLET | ORAL | 0 refills | Status: DC | PRN

## 2020-08-23 NOTE — Progress Notes (Signed)
POST OP VISIT   Chief Complaint  Patient presents with  . Post-op Follow-up    08/08/20 revision right total knee /improving     Encounter Diagnosis  Name Primary?  . S/P revision of total knee, right 08/08/20 Yes    Current Outpatient Medications:  .  amLODipine (NORVASC) 10 MG tablet, Take 1 tablet (10 mg total) by mouth daily., Disp: 90 tablet, Rfl: 3 .  aspirin EC 325 MG EC tablet, Take 1 tablet (325 mg total) by mouth daily with breakfast., Disp: 30 tablet, Rfl: 0 .  docusate sodium (COLACE) 100 MG capsule, Take 1 capsule (100 mg total) by mouth 2 (two) times daily., Disp: 10 capsule, Rfl: 0 .  HYDROcodone-acetaminophen (NORCO) 10-325 MG tablet, Take 1 tablet by mouth every 4 (four) hours as needed., Disp: 42 tablet, Rfl: 0 .  methocarbamol (ROBAXIN) 500 MG tablet, Take 1 tablet (500 mg total) by mouth every 6 (six) hours as needed for muscle spasms., Disp: 30 tablet, Rfl: 0 .  polyethylene glycol (MIRALAX / GLYCOLAX) 17 g packet, Take 17 g by mouth daily., Disp: 14 each, Rfl: 0 .  pregabalin (LYRICA) 50 MG capsule, Take 1 capsule (50 mg total) by mouth 3 (three) times daily., Disp: 30 capsule, Rfl: 2   POV # 1, postop day #15  DOS December 14  OP NOTE  PRE-OPERATIVE DIAGNOSIS:  arthrofibrosis right total knee replacement  POST-OPERATIVE DIAGNOSIS:  arthrofibrosis right total knee replacement  PROCEDURE:  Procedure(s): REVISION OF RIGHT TOTAL KNEE  TIBIAL COMPONENTS ONLY (Right)   Operative findings preop 30 degree flexion contracture 20 degrees range of motion flexion at 50 degrees  Intra-Op no effusion but extensive scar tissue obliteration of medial and lateral gutter quadriceps tendon adhering to the femur extensive thickening of the quadriceps and patellar tendon posteriorly  Implants tibial revision implant size 8 tibia size 12 rotating platform polyethylene   implant [employer]  Implant type Attune   DATA:  Incision : clean as a whistle  Calf  soft  Swelling mild   MEDS TO CONTROL PAIN NORCO 10, ROBAXIN AND LYRICA  OBJECTIVE FINDINGS :   10-90  ASSESSMENT AND PLAN   Doing well, I m still worried about his extension, but for now we have good pain control and good knee flexion   Start PT outpatient in [place]   Meds ordered this encounter  Medications  . HYDROcodone-acetaminophen (NORCO) 10-325 MG tablet    Sig: Take 1 tablet by mouth every 4 (four) hours as needed.    Dispense:  42 tablet    Refill:  0

## 2020-08-26 DIAGNOSIS — Z96651 Presence of right artificial knee joint: Secondary | ICD-10-CM | POA: Diagnosis not present

## 2020-08-26 DIAGNOSIS — M1711 Unilateral primary osteoarthritis, right knee: Secondary | ICD-10-CM | POA: Diagnosis not present

## 2020-08-29 ENCOUNTER — Ambulatory Visit (HOSPITAL_COMMUNITY): Payer: Medicaid Other | Attending: Orthopedic Surgery | Admitting: Physical Therapy

## 2020-08-29 ENCOUNTER — Encounter (HOSPITAL_COMMUNITY): Payer: Self-pay | Admitting: Physical Therapy

## 2020-08-29 ENCOUNTER — Other Ambulatory Visit: Payer: Self-pay

## 2020-08-29 DIAGNOSIS — R2689 Other abnormalities of gait and mobility: Secondary | ICD-10-CM | POA: Diagnosis not present

## 2020-08-29 DIAGNOSIS — M25661 Stiffness of right knee, not elsewhere classified: Secondary | ICD-10-CM | POA: Insufficient documentation

## 2020-08-29 DIAGNOSIS — M25561 Pain in right knee: Secondary | ICD-10-CM | POA: Diagnosis not present

## 2020-08-29 NOTE — Therapy (Signed)
[place], [place], [address] Phone: [phone]   Fax:  [phone]  Physical Therapy Evaluation  Patient Details  Name: Victor [name] MRN: [id number] Date of Birth: [birth year] Referring Provider (PT): Arther Abbott MD   Encounter Date: 08/29/2020   PT End of Session - 08/29/20 0855    Visit Number 1    Number of Visits 18    Date for PT Re-Evaluation 10/13/20    Authorization Type [place] Medicaid Healthy Blue    Authorization Time Period Check Josem Kaufmann (will have to submit auth again after initial 3 visits)    Authorization - Visit Number 1    Authorization - Number of Visits 1    PT Start Time 0820    PT Stop Time 0900    PT Time Calculation (min) 40 min    Activity Tolerance Patient tolerated treatment well    Behavior During Therapy [facility] for tasks assessed/performed           Past Medical History:  Diagnosis Date  . GERD (gastroesophageal reflux disease)   . History of stomach ulcers   . Hypertension     Past Surgical History:  Procedure Laterality Date  . ANTERIOR CRUCIATE LIGAMENT REPAIR Right 05/19/2018   Procedure: RIGHT KNEE ARTHROSCOPY WITH ANTERIOR CRUCIATE LIGAMENT (ACL) REPAIR and MEDIAL MENISECTOMY;  Surgeon: [name], MD;  Location: [facility];  Service: Orthopedics;  Laterality: Right;  . APPENDECTOMY    . HARDWARE REMOVAL Right 09/21/2019   Procedure: HARDWARE REMOVAL (RICHARD'S STAPLE) RIGHT KNEE;  Surgeon: [name], MD;  Location: [facility];  Service: Orthopedics;  Laterality: Right;  . KNEE ARTHROSCOPY WITH MEDIAL MENISECTOMY Right 05/29/2017   Procedure: KNEE ARTHROSCOPY WITH MEDIAL MENISECTOMY and lateral meniscectomy;  Surgeon: [name], MD;  Location: [facility];  Service: Orthopedics;  Laterality: Right;  . KNEE ARTHROSCOPY WITH MEDIAL MENISECTOMY Right 02/05/2018   Procedure: RIGHT KNEE ARTHROSCOPY WITH PARTIAL MEDIAL AND LATERAL MENISECTOMY;  Surgeon: [name], MD;  Location: [facility];  Service: Orthopedics;  Laterality: Right;  . TOTAL KNEE ARTHROPLASTY Right 03/28/2020   Procedure: TOTAL KNEE ARTHROPLASTY;  Surgeon: [name], MD;  Location: [facility];  Service: Orthopedics;  Laterality: Right;  . TOTAL KNEE REVISION Right 08/08/2020   Procedure: REVISION OF RIGHT TOTAL KNEE  TIBIAL COMPONENTS ONLY;  Surgeon: [name], MD;  Location: [facility];  Service: Orthopedics;  Laterality: Right;    There were no vitals filed for this visit.    Subjective Assessment - 08/29/20 0828    Subjective Patient presents to physical therapy with complaint of RT knee pain s//p RT TKA revision on 08/08/20. Patient reports having moderate to severe pain, but says he is taking his medicine as prescribed to help with this. Patient currently ambulating using bilateral axillary crutches. Says he had a few visits of home health therapy. Also has a CPM that he is using now, has this set at around 85 degrees. Says he had a hard time regaining mobility dafter his last surgery due to scar tissue build up.    Pertinent History RT TKA revision on 08/08/20    Limitations Standing;Walking;House hold activities    Patient Stated Goals Get more ROM    Currently in Pain? Yes    Pain Score 7     Pain Location Knee    Pain Orientation Right;Anterior    Pain Descriptors / Indicators Cramping;Sharp;Sore;Aching    Pain Type Surgical  pain    Pain Onset 1 to 4 weeks ago    Pain Frequency Constant    Aggravating Factors  walking, standings, exercise    Pain Relieving Factors ice, pain meds, stretching    Effect of Pain on Daily Activities Limits              [facility] PT Assessment - 08/29/20 0001      Assessment   Medical Diagnosis RT TKA revision    Referring Provider (PT) Arther Abbott MD    Onset Date/Surgical Date 08/08/20    Prior Therapy Yes      Precautions   Precautions None      Restrictions   Weight Bearing Restrictions No      Balance  Screen   Has the patient fallen in the past 6 months No      [place] residence    Living Arrangements Spouse/significant other      Prior Function   Level of Independence Independent      Cognition   Overall Cognitive Status Within Functional Limits for tasks assessed      Observation/Other Assessments   Observations incision is intact and appears to be well healing      Observation/Other Assessments-Edema    Edema --   min/mod edema noted diffuse about RT knee joint     ROM / Strength   AROM / PROM / Strength AROM      AROM   AROM Assessment Site Knee    Right/Left Knee Right;Left    Right Knee Extension 26   lacking   Right Knee Flexion 80    Left Knee Extension 0    Left Knee Flexion 135      Ambulation/Gait   Ambulation/Gait Yes    Ambulation/Gait Assistance 6: Modified independent (Device/Increase time)    Ambulation Distance (Feet) 200 Feet    Assistive device Crutches    Gait Pattern Decreased stance time - right;Decreased step length - left;Decreased stride length;Right flexed knee in stance;Antalgic    Ambulation Surface Level;Indoor    Gait Comments 2MWT      Balance   Balance Assessed --   Unable to perform single limb balance on RLE                     Objective measurements completed on examination: See above findings.       [facility] Adult PT Treatment/Exercise - 08/29/20 0001      Exercises   Exercises Knee/Hip      Knee/Hip Exercises: Stretches   Gastroc Stretch Right;1 rep;20 seconds   with towel/ strap supine     Knee/Hip Exercises: Supine   Quad Sets Right;5 reps    Straight Leg Raises Right;5 reps                  PT Education - 08/29/20 [id number]    Education Details on evaluation findings, POC and HEP    Person(s) Educated Patient    Methods Explanation;Handout    Comprehension Verbalized understanding            PT Short Term Goals - 08/29/20 0853      PT SHORT TERM GOAL  #1   Title Patient will be independent with initial HEP and self-management strategies to improve functional outcomes    Time 2    Period Weeks    Status New    Target Date 09/15/20  PT Long Term Goals - 08/29/20 0853      PT LONG TERM GOAL #1   Title Patient will report at least 75% overall improvement in subjective complaint to indicate improvement in ability to perform ADLs.    Time 6    Period Weeks    Status New    Target Date 10/13/20      PT LONG TERM GOAL #2   Title Patient will have Rt knee AROM at least 5-120 degrees to improve functional mobility and facilitate squatting to pick up items from floor.    Time 6    Period Weeks    Status New    Target Date 10/13/20      PT LONG TERM GOAL #3   Title Patient will be able to ambulate at least 350 feet during 2MWT with LRAD to demonstrate improved ability to perform functional mobility and associated tasks.    Time 6    Period Weeks    Status New    Target Date 10/13/20                  Plan - 08/29/20 0856    Clinical Impression Statement Patient is a 45 y.o. male who presents to physical therapy with complaint of RT knee pain s/p RT TKA on 08/08/20. Patient demonstrates decreased strength, ROM restriction, balance deficits and gait abnormalities which are likely contributing to symptoms of pain and are negatively impacting patient ability to perform ADLs and functional mobility tasks. Patient will benefit from skilled physical therapy services to address these deficits to reduce pain, improve level of function with ADLs, functional mobility tasks, and reduce risk for falls.    Personal Factors and Comorbidities Past/Current Experience    Examination-Activity Limitations Locomotion Level;Stand;Stairs;Squat;Transfers    Examination-Participation Restrictions Yard Work;Community Activity;Cleaning    Stability/Clinical Decision Making Stable/Uncomplicated    Clinical Decision Making Low    Rehab  Potential Good    PT Frequency 3x / week    PT Duration 6 weeks    PT Treatment/Interventions ADLs/Self Care Home Management;Aquatic Therapy;Biofeedback;Cryotherapy;Electrical Stimulation;Iontophoresis 4mg /ml Dexamethasone;Moist Heat;Traction;Balance training;Manual lymph drainage;Manual techniques;Therapeutic exercise;Therapeutic activities;Orthotic Fit/Training;Functional mobility training;Stair training;Gait training;DME Instruction;Patient/family education;Cognitive remediation;Scar mobilization;Passive range of motion;Visual/perceptual remediation/compensation;Compression bandaging;Neuromuscular re-education;Ultrasound;Parrafin;Contrast Bath;Fluidtherapy;Spinal Manipulations;Joint Manipulations;Dry needling;Energy conservation;Splinting;Taping;Vasopneumatic Device;Vestibular    PT Next Visit Plan Review goals and HEP. Progress knee strength and mobility as tolerated. Focus on flexion/ extension AROM initially, progress strengthening as tolerated. Manauals to improve edema, ROM and pain    PT Home Exercise Plan Eval: quad set, SLR, supine calf stretch    Consulted and Agree with Plan of Care Patient           Patient will benefit from skilled therapeutic intervention in order to improve the following deficits and impairments:  Abnormal gait,Hypomobility,Impaired flexibility,Decreased range of motion,Decreased balance,Difficulty walking,Decreased mobility,Decreased activity tolerance,Decreased strength,Increased fascial restricitons,Pain,Increased edema,Decreased scar mobility  Visit Diagnosis: Right knee pain, unspecified chronicity  Stiffness of right knee, not elsewhere classified  Other abnormalities of gait and mobility     Problem List Patient Active Problem List   Diagnosis Date Noted  . S/P revision of total knee, right 08/08/20 08/08/2020  . Arthrofibrosis of knee joint, right   . 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  . S/P hardware removal right knee 09/21/19  10/04/2019  . Essential hypertension 04/07/2019  . Family history of early CAD 04/07/2019  . History of chest pain 03/10/2019  . Hyperlipidemia 03/10/2019  . Tobacco abuse 03/10/2019  . S/P right knee arthroscopy  02/05/18 06/23/2017  . S/P ACL repair right 05/19/18   . Chondromalacia of lateral femoral condyle, right   . Chondromalacia, patella, right     12:23 PM, 08/29/20 Georges Lynch PT DPT  Physical Therapist with [facility]  [facility]  [phone]   [facility] [address] [place], [place], [address] Phone: [phone]   Fax:  [phone]  Name: Victor [name] MRN: [id number] Date of Birth: 14-Jul-1976

## 2020-08-29 NOTE — Patient Instructions (Signed)
Access Code: [email or web address] URL: [email or web address] Date: 08/29/2020 Prepared by: Georges Lynch  Exercises Supine Quad Set - 3 x daily - 7 x weekly - 2 sets - 10 reps - 5 sec hold Active Straight Leg Raise with Quad Set - 3 x daily - 7 x weekly - 2 sets - 10 reps Supine Calf Stretch with Strap - 3 x daily - 7 x weekly - 1 sets - 3 reps - 30 seconds hold

## 2020-08-30 ENCOUNTER — Other Ambulatory Visit: Payer: Self-pay | Admitting: Orthopedic Surgery

## 2020-08-30 MED ORDER — METHOCARBAMOL 500 MG PO TABS: 500.0000 mg | ORAL_TABLET | Freq: Four times a day (QID) | ORAL | 0 refills | Status: DC | PRN

## 2020-08-30 MED ORDER — HYDROCODONE-ACETAMINOPHEN 10-325 MG PO TABS: 1.0000 | ORAL_TABLET | ORAL | 0 refills | Status: DC | PRN

## 2020-08-30 NOTE — Telephone Encounter (Signed)
Pt left a Vm that he needs a refill on his hydrocodone and his Robaxin. He said that he will be out today. He uses [facility]

## 2020-08-31 ENCOUNTER — Ambulatory Visit (HOSPITAL_COMMUNITY): Payer: Medicaid Other | Admitting: Physical Therapy

## 2020-08-31 ENCOUNTER — Telehealth (HOSPITAL_COMMUNITY): Payer: Self-pay | Admitting: Physical Therapy

## 2020-08-31 NOTE — Telephone Encounter (Signed)
Patient's daughter tested positive for Covid - pt cx today 1/6 and Monday 09/04/20; due to new information coming about quaratine per [place]. Therapist will call patient with new update when we are notified before next apptment on 09/06/20.

## 2020-09-04 ENCOUNTER — Encounter (HOSPITAL_COMMUNITY): Payer: Medicaid Other | Admitting: Physical Therapy

## 2020-09-06 ENCOUNTER — Ambulatory Visit (HOSPITAL_COMMUNITY): Payer: Medicaid Other | Admitting: Physical Therapy

## 2020-09-06 ENCOUNTER — Encounter: Payer: Self-pay | Admitting: Orthopedic Surgery

## 2020-09-06 ENCOUNTER — Other Ambulatory Visit: Payer: Self-pay

## 2020-09-06 ENCOUNTER — Ambulatory Visit (INDEPENDENT_AMBULATORY_CARE_PROVIDER_SITE_OTHER): Payer: Medicaid Other | Admitting: Orthopedic Surgery

## 2020-09-06 ENCOUNTER — Encounter (HOSPITAL_COMMUNITY): Payer: Self-pay | Admitting: Physical Therapy

## 2020-09-06 DIAGNOSIS — M25561 Pain in right knee: Secondary | ICD-10-CM | POA: Diagnosis not present

## 2020-09-06 DIAGNOSIS — R2689 Other abnormalities of gait and mobility: Secondary | ICD-10-CM | POA: Diagnosis not present

## 2020-09-06 DIAGNOSIS — M25661 Stiffness of right knee, not elsewhere classified: Secondary | ICD-10-CM

## 2020-09-06 DIAGNOSIS — Z96651 Presence of right artificial knee joint: Secondary | ICD-10-CM

## 2020-09-06 DIAGNOSIS — M5410 Radiculopathy, site unspecified: Secondary | ICD-10-CM

## 2020-09-06 MED ORDER — METHOCARBAMOL 500 MG PO TABS
500.0000 mg | ORAL_TABLET | Freq: Four times a day (QID) | ORAL | 0 refills | Status: DC | PRN
Start: 2020-09-06 — End: 2020-09-14

## 2020-09-06 MED ORDER — PREGABALIN 50 MG PO CAPS: 50.0000 mg | ORAL_CAPSULE | Freq: Three times a day (TID) | ORAL | 2 refills | Status: DC

## 2020-09-06 MED ORDER — HYDROCODONE-ACETAMINOPHEN 10-325 MG PO TABS: 1.0000 | ORAL_TABLET | ORAL | 0 refills | Status: DC | PRN

## 2020-09-06 NOTE — Therapy (Signed)
[place], [place], [address] Phone: [phone]   Fax:  [phone]  Physical Therapy Treatment  Patient Details  Name: Victor [name] MRN: [id number] Date of Birth: 05-16-1976 Referring Provider (PT): Arther Abbott MD   Encounter Date: 09/06/2020   PT End of Session - 09/06/20 0824    Visit Number 2    Number of Visits 18    Date for PT Re-Evaluation 10/13/20    Authorization Type [place] Medicaid Healthy Blue    Authorization Time Period Still pending (09/06/20)    Authorization - Visit Number 2    Authorization - Number of Visits 1    PT Start Time 0820    PT Stop Time 0900    PT Time Calculation (min) 40 min    Activity Tolerance Patient tolerated treatment well    Behavior During Therapy [facility] for tasks assessed/performed           Past Medical History:  Diagnosis Date  . GERD (gastroesophageal reflux disease)   . History of stomach ulcers   . Hypertension     Past Surgical History:  Procedure Laterality Date  . ANTERIOR CRUCIATE LIGAMENT REPAIR Right 05/19/2018   Procedure: RIGHT KNEE ARTHROSCOPY WITH ANTERIOR CRUCIATE LIGAMENT (ACL) REPAIR and MEDIAL MENISECTOMY;  Surgeon: [name], MD;  Location: [facility];  Service: Orthopedics;  Laterality: Right;  . APPENDECTOMY    . HARDWARE REMOVAL Right 09/21/2019   Procedure: HARDWARE REMOVAL (RICHARD'S STAPLE) RIGHT KNEE;  Surgeon: [name], MD;  Location: [facility];  Service: Orthopedics;  Laterality: Right;  . KNEE ARTHROSCOPY WITH MEDIAL MENISECTOMY Right 05/29/2017   Procedure: KNEE ARTHROSCOPY WITH MEDIAL MENISECTOMY and lateral meniscectomy;  Surgeon: [name], MD;  Location: [facility];  Service: Orthopedics;  Laterality: Right;  . KNEE ARTHROSCOPY WITH MEDIAL MENISECTOMY Right 02/05/2018   Procedure: RIGHT KNEE ARTHROSCOPY WITH PARTIAL MEDIAL AND LATERAL MENISECTOMY;  Surgeon: [name], MD;  Location: [facility];  Service:  Orthopedics;  Laterality: Right;  . TOTAL KNEE ARTHROPLASTY Right 03/28/2020   Procedure: TOTAL KNEE ARTHROPLASTY;  Surgeon: [name], MD;  Location: [facility];  Service: Orthopedics;  Laterality: Right;  . TOTAL KNEE REVISION Right 08/08/2020   Procedure: REVISION OF RIGHT TOTAL KNEE  TIBIAL COMPONENTS ONLY;  Surgeon: [name], MD;  Location: [facility];  Service: Orthopedics;  Laterality: Right;    There were no vitals filed for this visit.   Subjective Assessment - 09/06/20 0823    Subjective Patient reports compliance with HEP. "Some days better than others". Still has CPM set to 85, says 90 was hurting too much.    Pertinent History RT TKA revision on 08/08/20    Limitations Standing;Walking;House hold activities    Patient Stated Goals Get more ROM    Currently in Pain? Yes    Pain Score 8     Pain Location Knee    Pain Orientation Anterior;Posterior    Pain Descriptors / Indicators Aching;Sore    Pain Type Surgical pain    Pain Onset 1 to 4 weeks ago    Pain Frequency Constant                             [facility] Adult PT Treatment/Exercise - 09/06/20 0001      Knee/Hip Exercises: Stretches   Passive Hamstring Stretch Right;5 reps;10 seconds    Passive Hamstring Stretch Limitations with  strap    Gastroc Stretch Right;5 reps;10 seconds      Knee/Hip Exercises: Seated   Other Seated Knee/Hip Exercises seated heel slides AAROM 10 x 5"      Knee/Hip Exercises: Supine   Quad Sets Right;1 set;10 reps    Quad Sets Limitations 5 second hold    Heel Slides Right;2 sets;10 reps    Heel Slides Limitations 5 second hold AAROM with strap    Straight Leg Raises Right;10 reps    Knee Extension AROM;Right    Knee Extension Limitations 21    Knee Flexion AAROM;Right    Knee Flexion Limitations 80 degrees      Manual Therapy   Manual Therapy Edema management;Joint mobilization    Manual therapy comments completed seperately from all other skilled  interventions    Edema Management retrograde message to RT knee with LE elevated on wedge for pain and swelling    Joint Mobilization patellar mobs in all planes to tolerance    Soft tissue mobilization IASTM to lateral quad, adductors for improved mobility                    PT Short Term Goals - 08/29/20 0853      PT SHORT TERM GOAL #1   Title Patient will be independent with initial HEP and self-management strategies to improve functional outcomes    Time 2    Period Weeks    Status New    Target Date 09/15/20             PT Long Term Goals - 08/29/20 0853      PT LONG TERM GOAL #1   Title Patient will report at least 75% overall improvement in subjective complaint to indicate improvement in ability to perform ADLs.    Time 6    Period Weeks    Status New    Target Date 10/13/20      PT LONG TERM GOAL #2   Title Patient will have Rt knee AROM at least 5-120 degrees to improve functional mobility and facilitate squatting to pick up items from floor.    Time 6    Period Weeks    Status New    Target Date 10/13/20      PT LONG TERM GOAL #3   Title Patient will be able to ambulate at least 350 feet during 2MWT with LRAD to demonstrate improved ability to perform functional mobility and associated tasks.    Time 6    Period Weeks    Status New    Target Date 10/13/20                 Plan - 09/06/20 1608    Clinical Impression Statement Reviewed goals and HEP. Patient remains very limited with knee AROM. Worked on HEP progressions for increased stretching and AAROM. Discussed increased frequency and adherence to HEP stretching for improved knee mobility. Added manual therapy to address pain, tissue restrictions and swelling. Patient issued updated HEP handout. Patient will continue to benefit from skilled therapy services to progress knee strength and AROM to reduce pain and improve LOF with ADLs.    Personal Factors and Comorbidities Past/Current  Experience    Examination-Activity Limitations Locomotion Level;Stand;Stairs;Squat;Transfers    Examination-Participation Restrictions Yard Work;Community Activity;Cleaning    Stability/Clinical Decision Making Stable/Uncomplicated    Rehab Potential Good    PT Frequency 3x / week    PT Duration 6 weeks    PT Treatment/Interventions ADLs/Self Care  Home Management;Aquatic Therapy;Biofeedback;Cryotherapy;Electrical Stimulation;Iontophoresis 4mg /ml Dexamethasone;Moist Heat;Traction;Balance training;Manual lymph drainage;Manual techniques;Therapeutic exercise;Therapeutic activities;Orthotic Fit/Training;Functional mobility training;Stair training;Gait training;DME Instruction;Patient/family education;Cognitive remediation;Scar mobilization;Passive range of motion;Visual/perceptual remediation/compensation;Compression bandaging;Neuromuscular re-education;Ultrasound;Parrafin;Contrast Bath;Fluidtherapy;Spinal Manipulations;Joint Manipulations;Dry needling;Energy conservation;Splinting;Taping;Vasopneumatic Device;Vestibular    PT Next Visit Plan Progress knee strength and mobility as tolerated. Focus on flexion/ extension AROM initially, progress strengthening as tolerated. Manauals to improve edema, ROM and pain    PT Home Exercise Plan Eval: quad set, SLR, supine calf stretch 09/06/20: supine HS stretch, heel slide with strap    Consulted and Agree with Plan of Care Patient           Patient will benefit from skilled therapeutic intervention in order to improve the following deficits and impairments:  Abnormal gait,Hypomobility,Impaired flexibility,Decreased range of motion,Decreased balance,Difficulty walking,Decreased mobility,Decreased activity tolerance,Decreased strength,Increased fascial restricitons,Pain,Increased edema,Decreased scar mobility  Visit Diagnosis: Right knee pain, unspecified chronicity  Stiffness of right knee, not elsewhere classified  Other abnormalities of gait and  mobility     Problem List Patient Active Problem List   Diagnosis Date Noted  . S/P revision of total knee, right 08/08/20 08/08/2020  . Arthrofibrosis of knee joint, right   . 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  . S/P hardware removal right knee 09/21/19 10/04/2019  . Essential hypertension 04/07/2019  . Family history of early CAD 04/07/2019  . History of chest pain 03/10/2019  . Hyperlipidemia 03/10/2019  . Tobacco abuse 03/10/2019  . S/P right knee arthroscopy 02/05/18 06/23/2017  . S/P ACL repair right 05/19/18   . Chondromalacia of lateral femoral condyle, right   . Chondromalacia, patella, right     4:15 PM, 09/06/20 Josue Hector PT DPT  Physical Therapist with [facility]  [phone] [place] [address] [place], [place], [address] Phone: [phone]   Fax:  [phone]  Name: Victor [name] MRN: [id number] Date of Birth: May 28, 1976

## 2020-09-06 NOTE — Progress Notes (Signed)
Chief Complaint  Patient presents with  . Post-op Follow-up    Right knee replacement revision 08/08/20   POD # 58  WEEK 57   45 year old male 4 weeks after revision total knee revision tibial component  Patient making slow progress.  He has 95 degrees of knee flexion still has 10 degrees of extension lag  He is on hydrocodone 10 mg for pain along with Lyrica which is giving him good pain control  He is weightbearing as tolerated with crutches still.  Recommend wean crutches continue therapy follow-up in 4 weeks

## 2020-09-06 NOTE — Patient Instructions (Addendum)
Long term  Encounter Diagnosis  Name Primary?  . S/P revision of total knee, right 08/08/20 Yes   Expect another surgery in 10-15 yrs   Will have difficulty with squatting , running, kneeling   Encounter Diagnoses  Name Primary?  .  Yes  . Radicular pain of right lower extremity    Expect long term chronic pain

## 2020-09-06 NOTE — Patient Instructions (Signed)
Access Code: [email or web address] URL: [email or web address] Date: 09/06/2020 Prepared by: Josue Hector  Exercises Supine Heel Slide with Strap - 5 x daily - 7 x weekly - 1-2 sets - 10 reps - 10 seconds hold Supine Hamstring Stretch with Strap - 5 x daily - 7 x weekly - 1-2 sets - 10 reps - 10 seconds hold

## 2020-09-08 ENCOUNTER — Other Ambulatory Visit: Payer: Self-pay

## 2020-09-08 ENCOUNTER — Ambulatory Visit (HOSPITAL_COMMUNITY): Payer: Medicaid Other

## 2020-09-08 DIAGNOSIS — M25561 Pain in right knee: Secondary | ICD-10-CM

## 2020-09-08 DIAGNOSIS — R2689 Other abnormalities of gait and mobility: Secondary | ICD-10-CM

## 2020-09-08 DIAGNOSIS — M25661 Stiffness of right knee, not elsewhere classified: Secondary | ICD-10-CM | POA: Diagnosis not present

## 2020-09-08 NOTE — Therapy (Signed)
[place], [place], [address] Phone: [phone]   Fax:  [phone]  Physical Therapy Treatment  Patient Details  Name: Victor [name] MRN: [id number] Date of Birth: 25-Aug-1976 Referring Provider (PT): Arther Abbott MD   Encounter Date: 09/08/2020   PT End of Session - 09/08/20 0817    Visit Number 3    Number of Visits 18    Date for PT Re-Evaluation 10/13/20    Authorization Type [place] Medicaid Healthy Blue    Authorization Time Period Still pending (09/06/20)    Authorization - Visit Number 3    Authorization - Number of Visits 1    PT Start Time 0815    PT Stop Time 0859    PT Time Calculation (min) 44 min    Activity Tolerance Patient tolerated treatment well    Behavior During Therapy [facility] for tasks assessed/performed           Past Medical History:  Diagnosis Date  . GERD (gastroesophageal reflux disease)   . History of stomach ulcers   . Hypertension     Past Surgical History:  Procedure Laterality Date  . ANTERIOR CRUCIATE LIGAMENT REPAIR Right 05/19/2018   Procedure: RIGHT KNEE ARTHROSCOPY WITH ANTERIOR CRUCIATE LIGAMENT (ACL) REPAIR and MEDIAL MENISECTOMY;  Surgeon: [name], MD;  Location: [facility];  Service: Orthopedics;  Laterality: Right;  . APPENDECTOMY    . HARDWARE REMOVAL Right 09/21/2019   Procedure: HARDWARE REMOVAL (RICHARD'S STAPLE) RIGHT KNEE;  Surgeon: [name], MD;  Location: [facility];  Service: Orthopedics;  Laterality: Right;  . KNEE ARTHROSCOPY WITH MEDIAL MENISECTOMY Right 05/29/2017   Procedure: KNEE ARTHROSCOPY WITH MEDIAL MENISECTOMY and lateral meniscectomy;  Surgeon: [name], MD;  Location: [facility];  Service: Orthopedics;  Laterality: Right;  . KNEE ARTHROSCOPY WITH MEDIAL MENISECTOMY Right 02/05/2018   Procedure: RIGHT KNEE ARTHROSCOPY WITH PARTIAL MEDIAL AND LATERAL MENISECTOMY;  Surgeon: [name], MD;  Location: [facility];  Service:  Orthopedics;  Laterality: Right;  . TOTAL KNEE ARTHROPLASTY Right 03/28/2020   Procedure: TOTAL KNEE ARTHROPLASTY;  Surgeon: [name], MD;  Location: [facility];  Service: Orthopedics;  Laterality: Right;  . TOTAL KNEE REVISION Right 08/08/2020   Procedure: REVISION OF RIGHT TOTAL KNEE  TIBIAL COMPONENTS ONLY;  Surgeon: [name], MD;  Location: [facility];  Service: Orthopedics;  Laterality: Right;    There were no vitals filed for this visit.   Subjective Assessment - 09/08/20 0849    Subjective Patient reports compliance with HEP and completing ROM/stretching exercises several times a day with emphasis on knee extension.  Patient reports pain is not any worse and he is tolerating increased activity well    Pertinent History RT TKA revision on 08/08/20    Limitations Standing;Walking;House hold activities    Patient Stated Goals Get more ROM    Currently in Pain? Yes    Pain Score 7     Pain Location Knee    Pain Orientation Right    Pain Descriptors / Indicators Aching    Pain Type Surgical pain    Pain Onset 1 to 4 weeks ago              [facility] PT Assessment - 09/08/20 0001      Assessment   Medical Diagnosis RT TKA revision    Referring Provider (PT) Arther Abbott MD    Onset Date/Surgical Date 08/08/20      AROM  Right Knee Extension 24   lacking   Right Knee Flexion 76                         [facility] Adult PT Treatment/Exercise - 09/08/20 0001      Knee/Hip Exercises: Stretches   Knee: Self-Stretch to increase Flexion Right;10 seconds   10 reps using bottom step     Knee/Hip Exercises: Seated   Long Arc Quad Strengthening;Right;3 sets;10 reps    Long Arc Quad Weight 6 lbs.    Heel Slides AAROM;Right      Knee/Hip Exercises: Supine   Heel Slides AAROM;Right;3 sets;10 reps   5 sec hold   Knee Extension Strengthening;Right;3 sets;10 reps   heel elevated   Other Supine Knee/Hip Exercises hamstring curl with green ball 3x10       Manual Therapy   Manual Therapy Joint mobilization    Manual therapy comments completed seperately from all other skilled interventions    Joint Mobilization rapid knee grade II using stability ball to promote tibial translation to improve knee extension and decrease guarding to advance ROM.  Grade 2 distractions coupled with quad contract-relax to increase Right knee flexion                  PT Education - 09/08/20 0851    Education Details reinforced importance of full knee extension to normalize gait pattern    Person(s) Educated Patient    Methods Explanation    Comprehension Verbalized understanding            PT Short Term Goals - 08/29/20 0853      PT SHORT TERM GOAL #1   Title Patient will be independent with initial HEP and self-management strategies to improve functional outcomes    Time 2    Period Weeks    Status New    Target Date 09/15/20             PT Long Term Goals - 08/29/20 0853      PT LONG TERM GOAL #1   Title Patient will report at least 75% overall improvement in subjective complaint to indicate improvement in ability to perform ADLs.    Time 6    Period Weeks    Status New    Target Date 10/13/20      PT LONG TERM GOAL #2   Title Patient will have Rt knee AROM at least 5-120 degrees to improve functional mobility and facilitate squatting to pick up items from floor.    Time 6    Period Weeks    Status New    Target Date 10/13/20      PT LONG TERM GOAL #3   Title Patient will be able to ambulate at least 350 feet during 2MWT with LRAD to demonstrate improved ability to perform functional mobility and associated tasks.    Time 6    Period Weeks    Status New    Target Date 10/13/20                 Plan - 09/08/20 0851    Clinical Impression Statement Patient tolerating tx session well and able to demonstrate good execution of HEP activities to improve knee ROM with emphasis on extension.  Continued tx required to progress  HEP and techniuqes to facilitate extension to normalize gait pattern.  Pt tolerating contract-relax technique to improve knee flexion and able to reduce quad guarding.    Personal Factors  and Comorbidities Past/Current Experience    Examination-Activity Limitations Locomotion Level;Stand;Stairs;Squat;Transfers    Examination-Participation Restrictions Yard Work;Community Activity;Cleaning    Stability/Clinical Decision Making Stable/Uncomplicated    Rehab Potential Good    PT Frequency 3x / week    PT Duration 6 weeks    PT Treatment/Interventions ADLs/Self Care Home Management;Aquatic Therapy;Biofeedback;Cryotherapy;Electrical Stimulation;Iontophoresis 4mg /ml Dexamethasone;Moist Heat;Traction;Balance training;Manual lymph drainage;Manual techniques;Therapeutic exercise;Therapeutic activities;Orthotic Fit/Training;Functional mobility training;Stair training;Gait training;DME Instruction;Patient/family education;Cognitive remediation;Scar mobilization;Passive range of motion;Visual/perceptual remediation/compensation;Compression bandaging;Neuromuscular re-education;Ultrasound;Parrafin;Contrast Bath;Fluidtherapy;Spinal Manipulations;Joint Manipulations;Dry needling;Energy conservation;Splinting;Taping;Vasopneumatic Device;Vestibular    PT Next Visit Plan Progress knee strength and mobility as tolerated. Focus on flexion/ extension AROM initially, progress strengthening as tolerated. Manauals to improve edema, ROM and pain    PT Home Exercise Plan Eval: quad set, SLR, supine calf stretch 09/06/20: supine HS stretch, heel slide with strap    Consulted and Agree with Plan of Care Patient           Patient will benefit from skilled therapeutic intervention in order to improve the following deficits and impairments:  Abnormal gait,Hypomobility,Impaired flexibility,Decreased range of motion,Decreased balance,Difficulty walking,Decreased mobility,Decreased activity tolerance,Decreased strength,Increased  fascial restricitons,Pain,Increased edema,Decreased scar mobility  Visit Diagnosis: Right knee pain, unspecified chronicity  Stiffness of right knee, not elsewhere classified  Other abnormalities of gait and mobility     Problem List Patient Active Problem List   Diagnosis Date Noted  . S/P revision of total knee, right 08/08/20 08/08/2020  . Arthrofibrosis of knee joint, right   . 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  . S/P hardware removal right knee 09/21/19 10/04/2019  . Essential hypertension 04/07/2019  . Family history of early CAD 04/07/2019  . History of chest pain 03/10/2019  . Hyperlipidemia 03/10/2019  . Tobacco abuse 03/10/2019  . S/P right knee arthroscopy 02/05/18 06/23/2017  . S/P ACL repair right 05/19/18   . Chondromalacia of lateral femoral condyle, right   . Chondromalacia, patella, right     9:02 AM, 09/08/20 M. Sherlyn Lees, PT, DPT Physical Therapist- [place] Office Number: [phone]  [place] [address] [place], [place], [address] Phone: [phone]   Fax:  [phone]  Name: Victor [name] MRN: [id number] Date of Birth: 1976/03/20

## 2020-09-11 ENCOUNTER — Encounter (HOSPITAL_COMMUNITY): Payer: Medicaid Other | Admitting: Physical Therapy

## 2020-09-12 ENCOUNTER — Encounter (HOSPITAL_COMMUNITY): Payer: Self-pay | Admitting: Physical Therapy

## 2020-09-12 ENCOUNTER — Ambulatory Visit (HOSPITAL_COMMUNITY): Payer: Medicaid Other | Admitting: Physical Therapy

## 2020-09-12 ENCOUNTER — Other Ambulatory Visit: Payer: Self-pay

## 2020-09-12 DIAGNOSIS — M25561 Pain in right knee: Secondary | ICD-10-CM | POA: Diagnosis not present

## 2020-09-12 DIAGNOSIS — R2689 Other abnormalities of gait and mobility: Secondary | ICD-10-CM | POA: Diagnosis not present

## 2020-09-12 DIAGNOSIS — M25661 Stiffness of right knee, not elsewhere classified: Secondary | ICD-10-CM

## 2020-09-12 NOTE — Therapy (Signed)
[place], [place], [address] Phone: [phone]   Fax:  [phone]  Physical Therapy Treatment  Patient Details  Name: Victor [name] MRN: [id number] Date of Birth: 1975-09-28 Referring Provider (PT): Arther Abbott MD   Encounter Date: 09/12/2020   PT End of Session - 09/12/20 1132    Visit Number 4    Number of Visits 18    Date for PT Re-Evaluation 10/13/20    Authorization Type [place] Medicaid Healthy Blue    Authorization Time Period Still pending (09/06/20); 12 visits requested on 09/12/20 for until 10/13/20 - check auth    Authorization - Visit Number 3    Authorization - Number of Visits 3    PT Start Time 1133    PT Stop Time 1212    PT Time Calculation (min) 39 min    Activity Tolerance Patient tolerated treatment well    Behavior During Therapy [facility] for tasks assessed/performed           Past Medical History:  Diagnosis Date  . GERD (gastroesophageal reflux disease)   . History of stomach ulcers   . Hypertension     Past Surgical History:  Procedure Laterality Date  . ANTERIOR CRUCIATE LIGAMENT REPAIR Right 05/19/2018   Procedure: RIGHT KNEE ARTHROSCOPY WITH ANTERIOR CRUCIATE LIGAMENT (ACL) REPAIR and MEDIAL MENISECTOMY;  Surgeon: [name], MD;  Location: [facility];  Service: Orthopedics;  Laterality: Right;  . APPENDECTOMY    . HARDWARE REMOVAL Right 09/21/2019   Procedure: HARDWARE REMOVAL (RICHARD'S STAPLE) RIGHT KNEE;  Surgeon: [name], MD;  Location: [facility];  Service: Orthopedics;  Laterality: Right;  . KNEE ARTHROSCOPY WITH MEDIAL MENISECTOMY Right 05/29/2017   Procedure: KNEE ARTHROSCOPY WITH MEDIAL MENISECTOMY and lateral meniscectomy;  Surgeon: [name], MD;  Location: [facility];  Service: Orthopedics;  Laterality: Right;  . KNEE ARTHROSCOPY WITH MEDIAL MENISECTOMY Right 02/05/2018   Procedure: RIGHT KNEE ARTHROSCOPY WITH PARTIAL MEDIAL AND LATERAL MENISECTOMY;   Surgeon: [name], MD;  Location: [facility];  Service: Orthopedics;  Laterality: Right;  . TOTAL KNEE ARTHROPLASTY Right 03/28/2020   Procedure: TOTAL KNEE ARTHROPLASTY;  Surgeon: [name], MD;  Location: [facility];  Service: Orthopedics;  Laterality: Right;  . TOTAL KNEE REVISION Right 08/08/2020   Procedure: REVISION OF RIGHT TOTAL KNEE  TIBIAL COMPONENTS ONLY;  Surgeon: [name], MD;  Location: [facility];  Service: Orthopedics;  Laterality: Right;    There were no vitals filed for this visit.   Subjective Assessment - 09/12/20 1134    Subjective Patient reports his knee has been painful. He continues to do stretches but it just feels tight.    Pertinent History RT TKA revision on 08/08/20    Limitations Standing;Walking;House hold activities    Patient Stated Goals Get more ROM    Currently in Pain? Yes    Pain Score 8     Pain Location Knee    Pain Orientation Right    Pain Descriptors / Indicators Aching    Pain Type Surgical pain    Pain Onset 1 to 4 weeks ago                             [facility] Adult PT Treatment/Exercise - 09/12/20 0001      Knee/Hip Exercises: Stretches   Gastroc Stretch 3 reps;30 seconds    Gastroc Stretch Limitations slant board  Knee/Hip Exercises: Standing   Forward Step Up Right;2 sets;10 reps;Step Height: 4"    Functional Squat 2 sets;10 reps      Knee/Hip Exercises: Supine   Knee Extension AROM    Knee Extension Limitations lacking 21    Knee Flexion AROM    Knee Flexion Limitations 83   improves to 86 following manual therapy     Manual Therapy   Manual Therapy Muscle Energy Technique    Manual therapy comments completed seperately from all other skilled interventions    Muscle Energy Technique contract relax for knee flexion in seated                  PT Education - 09/12/20 1137    Education Details Patient educated on HEP, exercise mechanics, limiting mid range positioning,  strengthening exercises    Person(s) Educated Patient    Methods Explanation;Demonstration;Handout    Comprehension Verbalized understanding;Returned demonstration            PT Short Term Goals - 08/29/20 0853      PT SHORT TERM GOAL #1   Title Patient will be independent with initial HEP and self-management strategies to improve functional outcomes    Time 2    Period Weeks    Status New    Target Date 09/15/20             PT Long Term Goals - 08/29/20 0853      PT LONG TERM GOAL #1   Title Patient will report at least 75% overall improvement in subjective complaint to indicate improvement in ability to perform ADLs.    Time 6    Period Weeks    Status New    Target Date 10/13/20      PT LONG TERM GOAL #2   Title Patient will have Rt knee AROM at least 5-120 degrees to improve functional mobility and facilitate squatting to pick up items from floor.    Time 6    Period Weeks    Status New    Target Date 10/13/20      PT LONG TERM GOAL #3   Title Patient will be able to ambulate at least 350 feet during 2MWT with LRAD to demonstrate improved ability to perform functional mobility and associated tasks.    Time 6    Period Weeks    Status New    Target Date 10/13/20                 Plan - 09/12/20 1133    Clinical Impression Statement Patient continues to demonstrate very limited knee flexion/extension ROM from lacking 21 to 83 at beginning of session. Patient tolerates contract relax exercise well with c/o pain but able to complete. ROM improves to 86 degrees of flexion following. Patient requires heavy UE support with step up exercise but is able to complete in limited ROM. He requires cueing for squat mechanics with good carry over but again requires UE support to complete secondary to impaired strength and ROM. Patient educated on limiting mid-range positions and continuing knee flexion/extension stretches. Patient will continue to benefit from skilled  physical therapy in order to reduce impairment and improve function.    Personal Factors and Comorbidities Past/Current Experience    Examination-Activity Limitations Locomotion Level;Stand;Stairs;Squat;Transfers    Examination-Participation Restrictions Yard Work;Community Activity;Cleaning    Stability/Clinical Decision Making Stable/Uncomplicated    Rehab Potential Good    PT Frequency 3x / week    PT Duration 6 weeks  PT Treatment/Interventions ADLs/Self Care Home Management;Aquatic Therapy;Biofeedback;Cryotherapy;Electrical Stimulation;Iontophoresis 4mg /ml Dexamethasone;Moist Heat;Traction;Balance training;Manual lymph drainage;Manual techniques;Therapeutic exercise;Therapeutic activities;Orthotic Fit/Training;Functional mobility training;Stair training;Gait training;DME Instruction;Patient/family education;Cognitive remediation;Scar mobilization;Passive range of motion;Visual/perceptual remediation/compensation;Compression bandaging;Neuromuscular re-education;Ultrasound;Parrafin;Contrast Bath;Fluidtherapy;Spinal Manipulations;Joint Manipulations;Dry needling;Energy conservation;Splinting;Taping;Vasopneumatic Device;Vestibular    PT Next Visit Plan Progress knee strength and mobility as tolerated. Focus on flexion/ extension AROM initially, progress strengthening as tolerated. Manauals to improve edema, ROM and pain    PT Home Exercise Plan Eval: quad set, SLR, supine calf stretch 09/06/20: supine HS stretch, heel slide with strap 1/18 calf stretch, squat, step up    Consulted and Agree with Plan of Care Patient           Patient will benefit from skilled therapeutic intervention in order to improve the following deficits and impairments:  Abnormal gait,Hypomobility,Impaired flexibility,Decreased range of motion,Decreased balance,Difficulty walking,Decreased mobility,Decreased activity tolerance,Decreased strength,Increased fascial restricitons,Pain,Increased edema,Decreased scar  mobility  Visit Diagnosis: Right knee pain, unspecified chronicity  Stiffness of right knee, not elsewhere classified  Other abnormalities of gait and mobility     Problem List Patient Active Problem List   Diagnosis Date Noted  . S/P revision of total knee, right 08/08/20 08/08/2020  . Arthrofibrosis of knee joint, right   . 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  . S/P hardware removal right knee 09/21/19 10/04/2019  . Essential hypertension 04/07/2019  . Family history of early CAD 04/07/2019  . History of chest pain 03/10/2019  . Hyperlipidemia 03/10/2019  . Tobacco abuse 03/10/2019  . S/P right knee arthroscopy 02/05/18 06/23/2017  . S/P ACL repair right 05/19/18   . Chondromalacia of lateral femoral condyle, right   . Chondromalacia, patella, right    12:29 PM, 09/12/20 Mearl Latin PT, DPT Physical Therapist at [place], [place], [address] Phone: [phone]   Fax:  [phone]  Name: [place] Jaquith Lolli MRN: [id number] Date of Birth: 09/27/1975

## 2020-09-12 NOTE — Patient Instructions (Signed)
Access Code: [email or web address] URL: [email or web address] Date: 09/12/2020 Prepared by: Mitzi Hansen Advertising account planner with Step - 2 x daily - 7 x weekly - 3 reps - 30 second hold Squat with Counter Support - 2 x daily - 7 x weekly - 2 sets - 10 reps Step Up - 2 x daily - 7 x weekly - 2 sets - 10 reps

## 2020-09-13 ENCOUNTER — Encounter (HOSPITAL_COMMUNITY): Payer: Self-pay | Admitting: Physical Therapy

## 2020-09-13 ENCOUNTER — Ambulatory Visit (HOSPITAL_COMMUNITY): Payer: Medicaid Other | Admitting: Physical Therapy

## 2020-09-13 DIAGNOSIS — M25661 Stiffness of right knee, not elsewhere classified: Secondary | ICD-10-CM

## 2020-09-13 DIAGNOSIS — M25561 Pain in right knee: Secondary | ICD-10-CM

## 2020-09-13 DIAGNOSIS — R2689 Other abnormalities of gait and mobility: Secondary | ICD-10-CM

## 2020-09-13 DIAGNOSIS — Z96651 Presence of right artificial knee joint: Secondary | ICD-10-CM

## 2020-09-13 NOTE — Patient Instructions (Signed)
Access Code: [email or web address] URL: [email or web address] Date: 09/13/2020 Prepared by: Josue Hector  Exercises Prone Quadriceps Stretch with Strap - 5 x daily - 7 x weekly - 1 sets - 10 reps - 10 seconds hold Prone Knee Extension with Ankle Weight - 5 x daily - 7 x weekly - 1 sets - 1 reps - 3-5 minute hold

## 2020-09-13 NOTE — Therapy (Signed)
[place], [place], [address] Phone: [phone]   Fax:  [phone]  Physical Therapy Treatment  Patient Details  Name: Victor [name] MRN: [id number] Date of Birth: 11-12-1975 Referring Provider (PT): Arther Abbott MD   Encounter Date: 09/13/2020   PT End of Session - 09/13/20 0822    Visit Number 5    Number of Visits 18    Date for PT Re-Evaluation 10/13/20    Authorization Type [place] Medicaid [employer]    Authorization Time Period Still pending (09/06/20); 12 visits requested on 09/12/20 for until 10/13/20 (pending on 09/12/20)    Authorization - Visit Number 4    Authorization - Number of Visits 3    PT Start Time 0818    PT Stop Time 0858    PT Time Calculation (min) 40 min    Activity Tolerance Patient tolerated treatment well;Patient limited by pain    Behavior During Therapy [facility] for tasks assessed/performed           Past Medical History:  Diagnosis Date  . GERD (gastroesophageal reflux disease)   . History of stomach ulcers   . Hypertension     Past Surgical History:  Procedure Laterality Date  . ANTERIOR CRUCIATE LIGAMENT REPAIR Right 05/19/2018   Procedure: RIGHT KNEE ARTHROSCOPY WITH ANTERIOR CRUCIATE LIGAMENT (ACL) REPAIR and MEDIAL MENISECTOMY;  Surgeon: [name], MD;  Location: [facility];  Service: Orthopedics;  Laterality: Right;  . APPENDECTOMY    . HARDWARE REMOVAL Right 09/21/2019   Procedure: HARDWARE REMOVAL (RICHARD'S STAPLE) RIGHT KNEE;  Surgeon: [name], MD;  Location: [facility];  Service: Orthopedics;  Laterality: Right;  . KNEE ARTHROSCOPY WITH MEDIAL MENISECTOMY Right 05/29/2017   Procedure: KNEE ARTHROSCOPY WITH MEDIAL MENISECTOMY and lateral meniscectomy;  Surgeon: [name], MD;  Location: [facility];  Service: Orthopedics;  Laterality: Right;  . KNEE ARTHROSCOPY WITH MEDIAL MENISECTOMY Right 02/05/2018   Procedure: RIGHT KNEE ARTHROSCOPY WITH PARTIAL  MEDIAL AND LATERAL MENISECTOMY;  Surgeon: [name], MD;  Location: [facility];  Service: Orthopedics;  Laterality: Right;  . TOTAL KNEE ARTHROPLASTY Right 03/28/2020   Procedure: TOTAL KNEE ARTHROPLASTY;  Surgeon: [name], MD;  Location: [facility];  Service: Orthopedics;  Laterality: Right;  . TOTAL KNEE REVISION Right 08/08/2020   Procedure: REVISION OF RIGHT TOTAL KNEE  TIBIAL COMPONENTS ONLY;  Surgeon: [name], MD;  Location: [facility];  Service: Orthopedics;  Laterality: Right;    There were no vitals filed for this visit.   Subjective Assessment - 09/13/20 0821    Subjective Patient states his knee is pretty sore after yesterday. Says he has been doing his stretches at home. Pain today is about a 7.    Pertinent History RT TKA revision on 08/08/20    Limitations Standing;Walking;House hold activities    Patient Stated Goals Get more ROM    Currently in Pain? Yes    Pain Score 7     Pain Location Knee    Pain Orientation Right    Pain Descriptors / Indicators Aching    Pain Type Surgical pain    Pain Onset 1 to 4 weeks ago    Pain Frequency Constant                             [facility] Adult PT Treatment/Exercise - 09/13/20 0001      Knee/Hip Exercises: Stretches  Quad Stretch Right   10 x 10" holds with strap, prone     Knee/Hip Exercises: Supine   Heel Slides AAROM;Right;10 reps   10" holds with strap   Knee Extension AROM    Knee Extension Limitations lacking 21    Knee Flexion AROM    Knee Flexion Limitations 82   post manual     Knee/Hip Exercises: Prone   Hamstring Curl 20 reps    Hamstring Curl Limitations 2 x 10 3#    Prone Knee Hang 3 minutes   with 3# on RLE     Manual Therapy   Manual Therapy Passive ROM    Manual therapy comments completed seperately from all other skilled interventions    Passive ROM RT knee PROM flexion and extension in seated, contract relax flexion in sitting 10 x 10" holds                     PT Short Term Goals - 08/29/20 0853      PT SHORT TERM GOAL #1   Title Patient will be independent with initial HEP and self-management strategies to improve functional outcomes    Time 2    Period Weeks    Status New    Target Date 09/15/20             PT Long Term Goals - 08/29/20 0853      PT LONG TERM GOAL #1   Title Patient will report at least 75% overall improvement in subjective complaint to indicate improvement in ability to perform ADLs.    Time 6    Period Weeks    Status New    Target Date 10/13/20      PT LONG TERM GOAL #2   Title Patient will have Rt knee AROM at least 5-120 degrees to improve functional mobility and facilitate squatting to pick up items from floor.    Time 6    Period Weeks    Status New    Target Date 10/13/20      PT LONG TERM GOAL #3   Title Patient will be able to ambulate at least 350 feet during 2MWT with LRAD to demonstrate improved ability to perform functional mobility and associated tasks.    Time 6    Period Weeks    Status New    Target Date 10/13/20                 Plan - 09/13/20 0853    Clinical Impression Statement Patient continues to be pain limited with stretching and demos continued restrictions in knee AROM. Educated patient on scar tissue buildup and need for increased frequency of stretching and ROM techniques for improved knee mobility. Performed manual stretching for knee flexion and extension. Added prone quad stretch and prone knee hangs weighted for improved knee mobility. Patient cued on proper form and educated on purpose of each. Updated HEP and issued handout. Patient will continue to benefit from skilled therapy services to address current deficits to reduce pain and improve functional mobility.    Personal Factors and Comorbidities Past/Current Experience    Examination-Activity Limitations Locomotion Level;Stand;Stairs;Squat;Transfers    Examination-Participation Restrictions  Yard Work;Community Activity;Cleaning    Stability/Clinical Decision Making Stable/Uncomplicated    Rehab Potential Good    PT Frequency 3x / week    PT Duration 6 weeks    PT Treatment/Interventions ADLs/Self Care Home Management;Aquatic Therapy;Biofeedback;Cryotherapy;Electrical Stimulation;Iontophoresis 4mg /ml Dexamethasone;Moist Heat;Traction;Balance training;Manual lymph drainage;Manual techniques;Therapeutic exercise;Therapeutic activities;Orthotic Fit/Training;Functional  mobility training;Stair training;Gait training;DME Instruction;Patient/family education;Cognitive remediation;Scar mobilization;Passive range of motion;Visual/perceptual remediation/compensation;Compression bandaging;Neuromuscular re-education;Ultrasound;Parrafin;Contrast Bath;Fluidtherapy;Spinal Manipulations;Joint Manipulations;Dry needling;Energy conservation;Splinting;Taping;Vasopneumatic Device;Vestibular    PT Next Visit Plan Progress knee strength and mobility as tolerated. Focus on flexion/ extension AROM, progress strengthening as tolerated. Manuals to improve edema, ROM and pain. Try adding recumbant bike rocking next visit.    PT Home Exercise Plan Eval: quad set, SLR, supine calf stretch 09/06/20: supine HS stretch, heel slide with strap 1/18 calf stretch, squat, step up 09/13/20: prone quad stretch, prone hangs    Consulted and Agree with Plan of Care Patient           Patient will benefit from skilled therapeutic intervention in order to improve the following deficits and impairments:  Abnormal gait,Hypomobility,Impaired flexibility,Decreased range of motion,Decreased balance,Difficulty walking,Decreased mobility,Decreased activity tolerance,Decreased strength,Increased fascial restricitons,Pain,Increased edema,Decreased scar mobility  Visit Diagnosis: Right knee pain, unspecified chronicity  Stiffness of right knee, not elsewhere classified  Other abnormalities of gait and mobility     Problem  List Patient Active Problem List   Diagnosis Date Noted  . S/P revision of total knee, right 08/08/20 08/08/2020  . Arthrofibrosis of knee joint, right   . 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  . S/P hardware removal right knee 09/21/19 10/04/2019  . Essential hypertension 04/07/2019  . Family history of early CAD 04/07/2019  . History of chest pain 03/10/2019  . Hyperlipidemia 03/10/2019  . Tobacco abuse 03/10/2019  . S/P right knee arthroscopy 02/05/18 06/23/2017  . S/P ACL repair right 05/19/18   . Chondromalacia of lateral femoral condyle, right   . Chondromalacia, patella, right    8:59 AM, 09/13/20 Josue Hector PT DPT  Physical Therapist with [facility]  [phone] [place] [address] [place], [place], [address] Phone: [phone]   Fax:  [phone]  Name: Victor [name] MRN: [id number] Date of Birth: Sep 04, 1975

## 2020-09-14 MED ORDER — HYDROCODONE-ACETAMINOPHEN 10-325 MG PO TABS: 1.0000 | ORAL_TABLET | ORAL | 0 refills | Status: DC | PRN

## 2020-09-14 MED ORDER — PREGABALIN 50 MG PO CAPS: 50.0000 mg | ORAL_CAPSULE | Freq: Three times a day (TID) | ORAL | 2 refills | Status: DC

## 2020-09-14 MED ORDER — METHOCARBAMOL 500 MG PO TABS
500.0000 mg | ORAL_TABLET | Freq: Four times a day (QID) | ORAL | 0 refills | Status: DC | PRN
Start: 2020-09-14 — End: 2020-09-21

## 2020-09-15 ENCOUNTER — Ambulatory Visit (HOSPITAL_COMMUNITY): Payer: Medicaid Other

## 2020-09-18 ENCOUNTER — Other Ambulatory Visit: Payer: Self-pay

## 2020-09-18 ENCOUNTER — Ambulatory Visit (HOSPITAL_COMMUNITY): Payer: Medicaid Other | Admitting: Physical Therapy

## 2020-09-18 ENCOUNTER — Encounter (HOSPITAL_COMMUNITY): Payer: Self-pay | Admitting: Physical Therapy

## 2020-09-18 ENCOUNTER — Telehealth (HOSPITAL_COMMUNITY): Payer: Self-pay | Admitting: Specialist

## 2020-09-18 DIAGNOSIS — M25661 Stiffness of right knee, not elsewhere classified: Secondary | ICD-10-CM | POA: Diagnosis not present

## 2020-09-18 DIAGNOSIS — M25561 Pain in right knee: Secondary | ICD-10-CM | POA: Diagnosis not present

## 2020-09-18 DIAGNOSIS — R2689 Other abnormalities of gait and mobility: Secondary | ICD-10-CM | POA: Diagnosis not present

## 2020-09-18 NOTE — Therapy (Signed)
[place], [place], [address] Phone: [phone]   Fax:  [phone]  Physical Therapy Treatment  Patient Details  Name: Victor [name] MRN: [id number] Date of Birth: 10-Aug-1976 Referring Provider (PT): Arther Abbott MD   Encounter Date: 09/18/2020   PT End of Session - 09/18/20 0906    Visit Number 6    Number of Visits 18    Date for PT Re-Evaluation 10/13/20    Authorization Type [place] Medicaid [employer]    Authorization Time Period 08/30/20-10/13/20    Authorization - Visit Number 5    Authorization - Number of Visits 20    PT Start Time 0901    PT Stop Time 0946    PT Time Calculation (min) 45 min    Activity Tolerance Patient tolerated treatment well;Patient limited by pain    Behavior During Therapy [facility] for tasks assessed/performed           Past Medical History:  Diagnosis Date  . GERD (gastroesophageal reflux disease)   . History of stomach ulcers   . Hypertension     Past Surgical History:  Procedure Laterality Date  . ANTERIOR CRUCIATE LIGAMENT REPAIR Right 05/19/2018   Procedure: RIGHT KNEE ARTHROSCOPY WITH ANTERIOR CRUCIATE LIGAMENT (ACL) REPAIR and MEDIAL MENISECTOMY;  Surgeon: [name], MD;  Location: [facility];  Service: Orthopedics;  Laterality: Right;  . APPENDECTOMY    . HARDWARE REMOVAL Right 09/21/2019   Procedure: HARDWARE REMOVAL (RICHARD'S STAPLE) RIGHT KNEE;  Surgeon: [name], MD;  Location: [facility];  Service: Orthopedics;  Laterality: Right;  . KNEE ARTHROSCOPY WITH MEDIAL MENISECTOMY Right 05/29/2017   Procedure: KNEE ARTHROSCOPY WITH MEDIAL MENISECTOMY and lateral meniscectomy;  Surgeon: [name], MD;  Location: [facility];  Service: Orthopedics;  Laterality: Right;  . KNEE ARTHROSCOPY WITH MEDIAL MENISECTOMY Right 02/05/2018   Procedure: RIGHT KNEE ARTHROSCOPY WITH PARTIAL MEDIAL AND LATERAL MENISECTOMY;  Surgeon: [name], MD;  Location: [facility];  Service: Orthopedics;  Laterality: Right;  . TOTAL KNEE ARTHROPLASTY Right 03/28/2020   Procedure: TOTAL KNEE ARTHROPLASTY;  Surgeon: [name], MD;  Location: [facility];  Service: Orthopedics;  Laterality: Right;  . TOTAL KNEE REVISION Right 08/08/2020   Procedure: REVISION OF RIGHT TOTAL KNEE  TIBIAL COMPONENTS ONLY;  Surgeon: [name], MD;  Location: [facility];  Service: Orthopedics;  Laterality: Right;    There were no vitals filed for this visit.   Subjective Assessment - 09/18/20 0905    Subjective Patient says he has been compliant with HEP. Says he has been having lots of sharp pain and nerve pains in the knee.    Pertinent History RT TKA revision on 08/08/20    Limitations Standing;Walking;House hold activities    Patient Stated Goals Get more ROM    Currently in Pain? Yes    Pain Score 7     Pain Location Knee    Pain Orientation Right    Pain Descriptors / Indicators Aching    Pain Type Surgical pain    Pain Onset 1 to 4 weeks ago    Pain Frequency Constant                             [facility] Adult PT Treatment/Exercise - 09/18/20 0001      Knee/Hip Exercises: Stretches   Passive Hamstring Stretch Right;3 reps;30 seconds    Passive Hamstring Stretch  Limitations on steps    Gastroc Stretch Both;3 reps;30 seconds    Gastroc Stretch Limitations from step      Knee/Hip Exercises: Aerobic   Recumbent Bike 4 min seat 13 rocking for mobility      Knee/Hip Exercises: Standing   Heel Raises Both;2 sets;10 reps    Knee Flexion Right;2 sets;10 reps    Knee Flexion Limitations 3#    Forward Step Up Right;2 sets;10 reps;Hand Hold: 2;Step Height: 4"      Manual Therapy   Manual Therapy Passive ROM    Manual therapy comments completed seperately from all other skilled interventions    Passive ROM RT knee PROM flexion and extension in seated and supine, contract relax flexion in sitting 10 x 10" holds                    PT Short  Term Goals - 08/29/20 0853      PT SHORT TERM GOAL #1   Title Patient will be independent with initial HEP and self-management strategies to improve functional outcomes    Time 2    Period Weeks    Status New    Target Date 09/15/20             PT Long Term Goals - 08/29/20 0853      PT LONG TERM GOAL #1   Title Patient will report at least 75% overall improvement in subjective complaint to indicate improvement in ability to perform ADLs.    Time 6    Period Weeks    Status New    Target Date 10/13/20      PT LONG TERM GOAL #2   Title Patient will have Rt knee AROM at least 5-120 degrees to improve functional mobility and facilitate squatting to pick up items from floor.    Time 6    Period Weeks    Status New    Target Date 10/13/20      PT LONG TERM GOAL #3   Title Patient will be able to ambulate at least 350 feet during 2MWT with LRAD to demonstrate improved ability to perform functional mobility and associated tasks.    Time 6    Period Weeks    Status New    Target Date 10/13/20                 Plan - 09/18/20 0944    Clinical Impression Statement Patient continues to be limited by pain and AROM deficits. Manual PROM performed to improve active knee mobility. Stretching performed to patient tolerance. Added standing step ups, TKE and standing knee flexion with weight to progress knee strengthening. Added standing knee flexion and TKEs to patient HEP. Patient will continue to benefit from skilled therapy services to progress knee strength and mobility for reduced pain and improved functional mobility.    Personal Factors and Comorbidities Past/Current Experience    Examination-Activity Limitations Locomotion Level;Stand;Stairs;Squat;Transfers    Examination-Participation Restrictions Yard Work;Community Activity;Cleaning    Stability/Clinical Decision Making Stable/Uncomplicated    Rehab Potential Good    PT Frequency 3x / week    PT Duration 6 weeks    PT  Treatment/Interventions ADLs/Self Care Home Management;Aquatic Therapy;Biofeedback;Cryotherapy;Electrical Stimulation;Iontophoresis 4mg /ml Dexamethasone;Moist Heat;Traction;Balance training;Manual lymph drainage;Manual techniques;Therapeutic exercise;Therapeutic activities;Orthotic Fit/Training;Functional mobility training;Stair training;Gait training;DME Instruction;Patient/family education;Cognitive remediation;Scar mobilization;Passive range of motion;Visual/perceptual remediation/compensation;Compression bandaging;Neuromuscular re-education;Ultrasound;Parrafin;Contrast Bath;Fluidtherapy;Spinal Manipulations;Joint Manipulations;Dry needling;Energy conservation;Splinting;Taping;Vasopneumatic Device;Vestibular    PT Next Visit Plan Progress knee strength and mobility as tolerated. Focus on flexion/  extension AROM, progress strengthening as tolerated. Manuals to improve edema, ROM and pain.    PT Home Exercise Plan Eval: quad set, SLR, supine calf stretch 09/06/20: supine HS stretch, heel slide with strap 1/18 calf stretch, squat, step up 09/13/20: prone quad stretch, prone hangs 1/24 standing knee flexion, TKE    Consulted and Agree with Plan of Care Patient           Patient will benefit from skilled therapeutic intervention in order to improve the following deficits and impairments:  Abnormal gait,Hypomobility,Impaired flexibility,Decreased range of motion,Decreased balance,Difficulty walking,Decreased mobility,Decreased activity tolerance,Decreased strength,Increased fascial restricitons,Pain,Increased edema,Decreased scar mobility  Visit Diagnosis: Right knee pain, unspecified chronicity  Stiffness of right knee, not elsewhere classified  Other abnormalities of gait and mobility     Problem List Patient Active Problem List   Diagnosis Date Noted  . S/P revision of total knee, right 08/08/20 08/08/2020  . Arthrofibrosis of knee joint, right   . 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   . S/P hardware removal right knee 09/21/19 10/04/2019  . Essential hypertension 04/07/2019  . Family history of early CAD 04/07/2019  . History of chest pain 03/10/2019  . Hyperlipidemia 03/10/2019  . Tobacco abuse 03/10/2019  . S/P right knee arthroscopy 02/05/18 06/23/2017  . S/P ACL repair right 05/19/18   . Chondromalacia of lateral femoral condyle, right   . Chondromalacia, patella, right     9:49 AM, 09/18/20 Josue Hector PT DPT  Physical Therapist with [facility]  [phone] [place] [address] [place], [place], [address] Phone: [phone]   Fax:  [phone]  Name: Victor [name] MRN: [id number] Date of Birth: [birth year]

## 2020-09-18 NOTE — Patient Instructions (Signed)
Access Code: [email or web address] URL: [email or web address] Date: 09/18/2020 Prepared by: Josue Hector  Exercises Standing Terminal Knee Extension with Resistance - 2-3 x daily - 7 x weekly - 2 sets - 10 reps - 5 second hold Standing Alternating Knee Flexion - 2-3 x daily - 7 x weekly - 2 sets - 10 reps

## 2020-09-19 ENCOUNTER — Other Ambulatory Visit: Payer: Self-pay

## 2020-09-19 DIAGNOSIS — Z96651 Presence of right artificial knee joint: Secondary | ICD-10-CM

## 2020-09-19 MED ORDER — HYDROCODONE-ACETAMINOPHEN 10-325 MG PO TABS: 1.0000 | ORAL_TABLET | ORAL | 0 refills | Status: DC | PRN

## 2020-09-20 ENCOUNTER — Encounter (HOSPITAL_COMMUNITY): Payer: Self-pay | Admitting: Physical Therapy

## 2020-09-20 ENCOUNTER — Other Ambulatory Visit: Payer: Self-pay

## 2020-09-20 ENCOUNTER — Ambulatory Visit (HOSPITAL_COMMUNITY): Payer: Medicaid Other | Admitting: Physical Therapy

## 2020-09-20 DIAGNOSIS — M25661 Stiffness of right knee, not elsewhere classified: Secondary | ICD-10-CM

## 2020-09-20 DIAGNOSIS — R2689 Other abnormalities of gait and mobility: Secondary | ICD-10-CM

## 2020-09-20 DIAGNOSIS — M25561 Pain in right knee: Secondary | ICD-10-CM

## 2020-09-20 NOTE — Therapy (Signed)
[address] [facility] [address] [place], [place], [address] Phone: [phone]   Fax:  [phone]  Physical Therapy Treatment  Patient Details  Name: Victor [name] MRN: [id number] Date of Birth: [birth year] Referring Provider (PT): Fuller CanadaStanley Harrison MD  Knee/Hip Exercises: Supine  Knee Extension AROM   Knee Extension Limitations 15   Knee Flexion AROM   Knee Flexion Limitations 87     Encounter Date: 09/20/2020   PT End of Session - 09/20/20 0904    Visit Number 7    Number of Visits 18    Date for PT Re-Evaluation 10/13/20    Authorization Type [place] [employer]    Authorization Time Period 08/30/20-10/13/20    Authorization - Visit Number 6    Authorization - Number of Visits 20    PT Start Time 0901    PT Stop Time 0945    PT Time Calculation (min) 44 min    Activity Tolerance Patient tolerated treatment well    Behavior [facility] for tasks assessed/performed           Past Medical History:  Diagnosis Date  . GERD (gastroesophageal reflux disease)   . History of stomach ulcers   . Hypertension     Past Surgical History:  Procedure Laterality Date  . ANTERIOR CRUCIATE LIGAMENT REPAIR Right 05/19/2018   Procedure: RIGHT KNEE ARTHROSCOPY WITH ANTERIOR CRUCIATE LIGAMENT (ACL) REPAIR and MEDIAL MENISECTOMY;  Surgeon: Vickki HearingHarrison, Stanley E, MD;  Location: [facility];  Service: Orthopedics;  Laterality: Right;  . APPENDECTOMY    . HARDWARE REMOVAL Right 09/21/2019   Procedure: HARDWARE REMOVAL (RICHARD'S STAPLE) RIGHT KNEE;  Surgeon: Vickki HearingHarrison, Stanley E, MD;  Location: [facility];  Service: Orthopedics;  Laterality: Right;  . KNEE ARTHROSCOPY WITH MEDIAL MENISECTOMY Right 05/29/2017   Procedure: KNEE ARTHROSCOPY WITH MEDIAL MENISECTOMY and lateral meniscectomy;  Surgeon: Vickki HearingHarrison, Stanley E, MD;  Location: [facility];  Service: Orthopedics;  Laterality: Right;  . KNEE ARTHROSCOPY WITH MEDIAL MENISECTOMY Right 02/05/2018   Procedure:  RIGHT KNEE ARTHROSCOPY WITH PARTIAL MEDIAL AND LATERAL MENISECTOMY;  Surgeon: Vickki HearingHarrison, Stanley E, MD;  Location: [facility];  Service: Orthopedics;  Laterality: Right;  . TOTAL KNEE ARTHROPLASTY Right 03/28/2020   Procedure: TOTAL KNEE ARTHROPLASTY;  Surgeon: Vickki HearingHarrison, Stanley E, MD;  Location: [facility];  Service: Orthopedics;  Laterality: Right;  . TOTAL KNEE REVISION Right 08/08/2020   Procedure: REVISION OF RIGHT TOTAL KNEE  TIBIAL COMPONENTS ONLY;  Surgeon: Vickki HearingHarrison, Stanley E, MD;  Location: [facility];  Service: Orthopedics;  Laterality: Right;    There were no vitals filed for this visit.   Subjective Assessment - 09/20/20 0903    Subjective Patient reports knee was pretty sore after stretching last time. Pretty sore today. Has been walking more wihtout crutches.    Pertinent History RT TKA revision on 08/08/20    Limitations Standing;Walking;House hold activities    Patient Stated Goals Get more ROM    Currently in Pain? Yes    Pain Score 6     Pain Location Knee    Pain Orientation Right    Pain Descriptors / Indicators Aching    Pain Type Surgical pain    Pain Onset 1 to 4 weeks ago    Pain Frequency Constant                             [facility] Adult PT Treatment/Exercise - 09/20/20 0001  Knee/Hip Exercises: Stretches   Press photographer Both;3 reps;30 seconds    Gastroc Stretch Limitations slant board      Knee/Hip Exercises: Machines for Strengthening   Cybex Leg Press DL 1 x 15 30#      Knee/Hip Exercises: Standing   Heel Raises Both;2 sets;10 reps    Heel Raises Limitations from incline    Step Down Right;1 set;15 reps;Hand Hold: 2;Step Height: 4"    Gait Training 200 feet with no AD, cues for RT heel strike      Knee/Hip Exercises: Supine   Knee Extension AROM    Knee Extension Limitations 15    Knee Flexion AROM    Knee Flexion Limitations 87      Manual Therapy   Manual Therapy Passive ROM;Soft tissue mobilization    Manual therapy comments  completed seperately from all other skilled interventions    Soft tissue mobilization STM to RT hamstrings and calf wiht patient in prone    Passive ROM RT knee PROM flexion and extension in seated and supine, contract relax flexion in sitting and seated 5 x 20" ea holds, contract relax knee extension prone 5 x 20"                    PT Short Term Goals - 08/29/20 0853      PT SHORT TERM GOAL #1   Title Patient will be independent with initial HEP and self-management strategies to improve functional outcomes    Time 2    Period Weeks    Status New    Target Date 09/15/20             PT Long Term Goals - 08/29/20 0853      PT LONG TERM GOAL #1   Title Patient will report at least 75% overall improvement in subjective complaint to indicate improvement in ability to perform ADLs.    Time 6    Period Weeks    Status New    Target Date 10/13/20      PT LONG TERM GOAL #2   Title Patient will have Rt knee AROM at least 5-120 degrees to improve functional mobility and facilitate squatting to pick up items from floor.    Time 6    Period Weeks    Status New    Target Date 10/13/20      PT LONG TERM GOAL #3   Title Patient will be able to ambulate at least 350 feet during 2MWT with LRAD to demonstrate improved ability to perform functional mobility and associated tasks.    Time 6    Period Weeks    Status New    Target Date 10/13/20                 Plan - 09/20/20 0943    Clinical Impression Statement Patient showed improved tolerance with treatment today. Manual stretching performed within patient tolerance. Patient showing improved AROM post manuals. Added STM to RLE for reduced pain and inflammation, and improved mobility. Added eccentric step downs for quad control, patient cued on slowed lowering. Also added leg press for LE strength progression. Patient tolerated this well. Patient encouraged to continue with increased frequency of home stretching to  continue to progress knee mobility.    Personal Factors and Comorbidities Past/Current Experience    Examination-Activity Limitations Locomotion Level;Stand;Stairs;Squat;Transfers    Examination-Participation Restrictions Yard Work;Community Activity;Cleaning    Stability/Clinical Decision Making Stable/Uncomplicated    Rehab Potential Good  PT Frequency 3x / week    PT Duration 6 weeks    PT Treatment/Interventions ADLs/Self Care Home Management;Aquatic Therapy;Biofeedback;Cryotherapy;Electrical Stimulation;Iontophoresis 4mg /ml Dexamethasone;Moist Heat;Traction;Balance training;Manual lymph drainage;Manual techniques;Therapeutic exercise;Therapeutic activities;Orthotic Fit/Training;Functional mobility training;Stair training;Gait training;DME Instruction;Patient/family education;Cognitive remediation;Scar mobilization;Passive range of motion;Visual/perceptual remediation/compensation;Compression bandaging;Neuromuscular re-education;Ultrasound;Parrafin;Contrast Bath;Fluidtherapy;Spinal Manipulations;Joint Manipulations;Dry needling;Energy conservation;Splinting;Taping;Vasopneumatic Device;Vestibular    PT Next Visit Plan Progress knee strength and mobility as tolerated. Focus on flexion/ extension AROM, progress strengthening as tolerated. Continue with STM and PROM manuals to improve AROM    PT Home Exercise Plan Eval: quad set, SLR, supine calf stretch 09/06/20: supine HS stretch, heel slide with strap 1/18 calf stretch, squat, step up 09/13/20: prone quad stretch, prone hangs 1/24 standing knee flexion, TKE    Consulted and Agree with Plan of Care Patient           Patient will benefit from skilled therapeutic intervention in order to improve the following deficits and impairments:  Abnormal gait,Hypomobility,Impaired flexibility,Decreased range of motion,Decreased balance,Difficulty walking,Decreased mobility,Decreased activity tolerance,Decreased strength,Increased fascial  restricitons,Pain,Increased edema,Decreased scar mobility  Visit Diagnosis: Right knee pain, unspecified chronicity  Stiffness of right knee, not elsewhere classified  Other abnormalities of gait and mobility     Problem List Patient Active Problem List   Diagnosis Date Noted  . S/P revision of total knee, right 08/08/20 08/08/2020  . Arthrofibrosis of knee joint, right   . 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  . S/P hardware removal right knee 09/21/19 10/04/2019  . Essential hypertension 04/07/2019  . Family history of early CAD 04/07/2019  . History of chest pain 03/10/2019  . Hyperlipidemia 03/10/2019  . Tobacco abuse 03/10/2019  . S/P right knee arthroscopy 02/05/18 06/23/2017  . S/P ACL repair right 05/19/18   . Chondromalacia of lateral femoral condyle, right   . Chondromalacia, patella, right     9:48 AM, 09/20/20 Josue Hector PT DPT  Physical Therapist with [facility]  [phone] [place] [address] [place], [place], [address] Phone: [phone]   Fax:  [phone]  Name: Victor [name] MRN: [id number] Date of Birth: 09-28-1975

## 2020-09-21 MED ORDER — METHOCARBAMOL 500 MG PO TABS
500.0000 mg | ORAL_TABLET | Freq: Four times a day (QID) | ORAL | 0 refills | Status: DC | PRN
Start: 2020-09-21 — End: 2020-09-29

## 2020-09-21 MED ORDER — PREGABALIN 50 MG PO CAPS: 50.0000 mg | ORAL_CAPSULE | Freq: Three times a day (TID) | ORAL | 2 refills | Status: DC

## 2020-09-21 NOTE — Addendum Note (Signed)
Addended byCandice Camp on: 09/21/2020 08:08 AM   Modules accepted: Orders

## 2020-09-22 ENCOUNTER — Ambulatory Visit (HOSPITAL_COMMUNITY): Payer: Medicaid Other

## 2020-09-22 ENCOUNTER — Telehealth (HOSPITAL_COMMUNITY): Payer: Self-pay

## 2020-09-22 NOTE — Telephone Encounter (Signed)
He has a dental apptment and will not be out in time to get here - please cx today

## 2020-09-24 DIAGNOSIS — Z96651 Presence of right artificial knee joint: Secondary | ICD-10-CM

## 2020-09-25 ENCOUNTER — Encounter (HOSPITAL_COMMUNITY): Payer: Self-pay | Admitting: Physical Therapy

## 2020-09-25 ENCOUNTER — Other Ambulatory Visit: Payer: Self-pay

## 2020-09-25 ENCOUNTER — Ambulatory Visit (HOSPITAL_COMMUNITY): Payer: Medicaid Other | Admitting: Physical Therapy

## 2020-09-25 DIAGNOSIS — M25561 Pain in right knee: Secondary | ICD-10-CM

## 2020-09-25 DIAGNOSIS — M25661 Stiffness of right knee, not elsewhere classified: Secondary | ICD-10-CM | POA: Diagnosis not present

## 2020-09-25 DIAGNOSIS — R2689 Other abnormalities of gait and mobility: Secondary | ICD-10-CM | POA: Diagnosis not present

## 2020-09-25 NOTE — Therapy (Signed)
[place], [place], [address] Phone: [phone]   Fax:  [phone]  Physical Therapy Treatment  Patient Details  Name: Victor [name] MRN: [id number] Date of Birth: 1975/10/29 Referring Provider (PT): Arther Abbott MD   Encounter Date: 09/25/2020   PT End of Session - 09/25/20 0818    Visit Number 8    Number of Visits 18    Date for PT Re-Evaluation 10/13/20    Authorization Type [place] [employer]    Authorization Time Period 08/30/20-10/13/20    Authorization - Visit Number 7    Authorization - Number of Visits 20    PT Start Time 0818    PT Stop Time 0900    PT Time Calculation (min) 42 min    Activity Tolerance Patient tolerated treatment well    Behavior During Therapy [facility] for tasks assessed/performed           Past Medical History:  Diagnosis Date  . GERD (gastroesophageal reflux disease)   . History of stomach ulcers   . Hypertension     Past Surgical History:  Procedure Laterality Date  . ANTERIOR CRUCIATE LIGAMENT REPAIR Right 05/19/2018   Procedure: RIGHT KNEE ARTHROSCOPY WITH ANTERIOR CRUCIATE LIGAMENT (ACL) REPAIR and MEDIAL MENISECTOMY;  Surgeon: [name], MD;  Location: [facility];  Service: Orthopedics;  Laterality: Right;  . APPENDECTOMY    . HARDWARE REMOVAL Right 09/21/2019   Procedure: HARDWARE REMOVAL (RICHARD'S STAPLE) RIGHT KNEE;  Surgeon: [name], MD;  Location: [facility];  Service: Orthopedics;  Laterality: Right;  . KNEE ARTHROSCOPY WITH MEDIAL MENISECTOMY Right 05/29/2017   Procedure: KNEE ARTHROSCOPY WITH MEDIAL MENISECTOMY and lateral meniscectomy;  Surgeon: [name], MD;  Location: [facility];  Service: Orthopedics;  Laterality: Right;  . KNEE ARTHROSCOPY WITH MEDIAL MENISECTOMY Right 02/05/2018   Procedure: RIGHT KNEE ARTHROSCOPY WITH PARTIAL MEDIAL AND LATERAL MENISECTOMY;  Surgeon: [name], MD;  Location: [facility];  Service:  Orthopedics;  Laterality: Right;  . TOTAL KNEE ARTHROPLASTY Right 03/28/2020   Procedure: TOTAL KNEE ARTHROPLASTY;  Surgeon: [name], MD;  Location: [facility];  Service: Orthopedics;  Laterality: Right;  . TOTAL KNEE REVISION Right 08/08/2020   Procedure: REVISION OF RIGHT TOTAL KNEE  TIBIAL COMPONENTS ONLY;  Surgeon: [name], MD;  Location: [facility];  Service: Orthopedics;  Laterality: Right;    There were no vitals filed for this visit.   Subjective Assessment - 09/25/20 0825    Subjective Patient says he had a real bad day on Saturday. He has been in a lot of pain since. He says he hasn't done anything different, no mechanism for increased pain. He is worried that the cement came loose again. He has gone back to using crutches.    Pertinent History RT TKA revision on 08/08/20    Limitations Standing;Walking;House hold activities    Patient Stated Goals Get more ROM    Currently in Pain? Yes    Pain Score 7     Pain Location Knee    Pain Orientation Right    Pain Descriptors / Indicators Aching    Pain Type Surgical pain    Pain Onset 1 to 4 weeks ago    Pain Frequency Constant                             [facility] Adult PT Treatment/Exercise - 09/25/20 0001  Knee/Hip Exercises: Seated   Heel Slides Right;5 reps      Knee/Hip Exercises: Supine   Knee Extension AROM    Knee Extension Limitations 25    Knee Flexion AROM    Knee Flexion Limitations 88      Manual Therapy   Manual Therapy Passive ROM;Soft tissue mobilization    Manual therapy comments completed seperately from all other skilled interventions    Soft tissue mobilization STM to distal quad with RT knee in flexion    Myofascial Release Scar tissue message    Passive ROM RT knee PROM flexion and extension in seated and supine, contract relax flexion in sitting and seated 5 x 20" ea holds                    PT Short Term Goals - 08/29/20 0853      PT SHORT TERM GOAL  #1   Title Patient will be independent with initial HEP and self-management strategies to improve functional outcomes    Time 2    Period Weeks    Status New    Target Date 09/15/20             PT Long Term Goals - 08/29/20 0853      PT LONG TERM GOAL #1   Title Patient will report at least 75% overall improvement in subjective complaint to indicate improvement in ability to perform ADLs.    Time 6    Period Weeks    Status New    Target Date 10/13/20      PT LONG TERM GOAL #2   Title Patient will have Rt knee AROM at least 5-120 degrees to improve functional mobility and facilitate squatting to pick up items from floor.    Time 6    Period Weeks    Status New    Target Date 10/13/20      PT LONG TERM GOAL #3   Title Patient will be able to ambulate at least 350 feet during 2MWT with LRAD to demonstrate improved ability to perform functional mobility and associated tasks.    Time 6    Period Weeks    Status New    Target Date 10/13/20                 Plan - 09/25/20 0859    Clinical Impression Statement Patient reports increased knee pain today with no apparent mechanism. Patient shows well maintained knee flexion ROM, after manual therapy, but shows decreased knee extension. More time focused on manual therapy for mobility and pain control today per patient subjective report. Will resume ther ex as tolerated.    Personal Factors and Comorbidities Past/Current Experience    Examination-Activity Limitations Locomotion Level;Stand;Stairs;Squat;Transfers    Examination-Participation Restrictions Yard Work;Community Activity;Cleaning    Stability/Clinical Decision Making Stable/Uncomplicated    Rehab Potential Good    PT Frequency 3x / week    PT Duration 6 weeks    PT Treatment/Interventions ADLs/Self Care Home Management;Aquatic Therapy;Biofeedback;Cryotherapy;Electrical Stimulation;Iontophoresis 4mg /ml Dexamethasone;Moist Heat;Traction;Balance training;Manual lymph  drainage;Manual techniques;Therapeutic exercise;Therapeutic activities;Orthotic Fit/Training;Functional mobility training;Stair training;Gait training;DME Instruction;Patient/family education;Cognitive remediation;Scar mobilization;Passive range of motion;Visual/perceptual remediation/compensation;Compression bandaging;Neuromuscular re-education;Ultrasound;Parrafin;Contrast Bath;Fluidtherapy;Spinal Manipulations;Joint Manipulations;Dry needling;Energy conservation;Splinting;Taping;Vasopneumatic Device;Vestibular    PT Next Visit Plan Progress knee strength and mobility as tolerated. Focus on flexion/ extension AROM, progress strengthening as tolerated. Continue with STM and PROM manuals to improve AROM    PT Home Exercise Plan Eval: quad set, SLR, supine calf stretch 09/06/20: supine HS stretch,  heel slide with strap 1/18 calf stretch, squat, step up 09/13/20: prone quad stretch, prone hangs 1/24 standing knee flexion, TKE    Consulted and Agree with Plan of Care Patient           Patient will benefit from skilled therapeutic intervention in order to improve the following deficits and impairments:  Abnormal gait,Hypomobility,Impaired flexibility,Decreased range of motion,Decreased balance,Difficulty walking,Decreased mobility,Decreased activity tolerance,Decreased strength,Increased fascial restricitons,Pain,Increased edema,Decreased scar mobility  Visit Diagnosis: Right knee pain, unspecified chronicity  Stiffness of right knee, not elsewhere classified  Other abnormalities of gait and mobility     Problem List Patient Active Problem List   Diagnosis Date Noted  . S/P revision of total knee, right 08/08/20 08/08/2020  . Arthrofibrosis of knee joint, right   . 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  . S/P hardware removal right knee 09/21/19 10/04/2019  . Essential hypertension 04/07/2019  . Family history of early CAD 04/07/2019  . History of chest pain 03/10/2019  . Hyperlipidemia  03/10/2019  . Tobacco abuse 03/10/2019  . S/P right knee arthroscopy 02/05/18 06/23/2017  . S/P ACL repair right 05/19/18   . Chondromalacia of lateral femoral condyle, right   . Chondromalacia, patella, right     9:03 AM, 09/25/20 Josue Hector PT DPT  Physical Therapist with [facility]  [phone] [place] [address] [place], [place], [address] Phone: [phone]   Fax:  [phone]  Name: Victor [name] MRN: [id number] Date of Birth: [birth year]

## 2020-09-26 DIAGNOSIS — Z96651 Presence of right artificial knee joint: Secondary | ICD-10-CM

## 2020-09-27 ENCOUNTER — Encounter (HOSPITAL_COMMUNITY): Payer: Self-pay | Admitting: Physical Therapy

## 2020-09-27 ENCOUNTER — Ambulatory Visit (HOSPITAL_COMMUNITY): Payer: Medicaid Other | Attending: Orthopedic Surgery | Admitting: Physical Therapy

## 2020-09-27 ENCOUNTER — Other Ambulatory Visit: Payer: Self-pay

## 2020-09-27 DIAGNOSIS — M25561 Pain in right knee: Secondary | ICD-10-CM | POA: Diagnosis not present

## 2020-09-27 DIAGNOSIS — M25661 Stiffness of right knee, not elsewhere classified: Secondary | ICD-10-CM | POA: Insufficient documentation

## 2020-09-27 DIAGNOSIS — M6281 Muscle weakness (generalized): Secondary | ICD-10-CM | POA: Insufficient documentation

## 2020-09-27 DIAGNOSIS — G8929 Other chronic pain: Secondary | ICD-10-CM | POA: Diagnosis not present

## 2020-09-27 DIAGNOSIS — R2689 Other abnormalities of gait and mobility: Secondary | ICD-10-CM | POA: Diagnosis not present

## 2020-09-27 MED ORDER — HYDROCODONE-ACETAMINOPHEN 10-325 MG PO TABS: 1.0000 | ORAL_TABLET | ORAL | 0 refills | Status: DC | PRN

## 2020-09-27 NOTE — Patient Instructions (Signed)
Access Code: [email or web address] URL: [email or web address] Date: 09/27/2020 Prepared by: Josue Hector  Exercises Sit to Stand without Arm Support - 2 x daily - 7 x weekly - 1-2 sets - 10 reps Seated Hamstring Stretch - 3 x daily - 7 x weekly - 1 sets - 3 reps - 30 seconds hold

## 2020-09-27 NOTE — Therapy (Signed)
[place], [place], [address] Phone: [phone]   Fax:  [phone]  Physical Therapy Treatment  Patient Details  Name: Victor [name] MRN: [id number] Date of Birth: March 18, 1976 Referring Provider (PT): Arther Abbott MD  Knee/Hip Exercises: Supine  Knee Extension AROM   Knee Extension Limitations 20   Knee Flexion AROM   Knee Flexion Limitations 91    Encounter Date: 09/27/2020   PT End of Session - 09/27/20 0823    Visit Number 9    Number of Visits 18    Date for PT Re-Evaluation 10/13/20    Authorization Type [place] [employer]    Authorization Time Period 08/30/20-10/13/20    Authorization - Visit Number 8    Authorization - Number of Visits 20    PT Start Time 0819    PT Stop Time 0901    PT Time Calculation (min) 42 min    Activity Tolerance Patient tolerated treatment well;Patient limited by pain    Behavior During Therapy [facility] for tasks assessed/performed           Past Medical History:  Diagnosis Date  . GERD (gastroesophageal reflux disease)   . History of stomach ulcers   . Hypertension     Past Surgical History:  Procedure Laterality Date  . ANTERIOR CRUCIATE LIGAMENT REPAIR Right 05/19/2018   Procedure: RIGHT KNEE ARTHROSCOPY WITH ANTERIOR CRUCIATE LIGAMENT (ACL) REPAIR and MEDIAL MENISECTOMY;  Surgeon: [name], MD;  Location: [facility];  Service: Orthopedics;  Laterality: Right;  . APPENDECTOMY    . HARDWARE REMOVAL Right 09/21/2019   Procedure: HARDWARE REMOVAL (RICHARD'S STAPLE) RIGHT KNEE;  Surgeon: [name], MD;  Location: [facility];  Service: Orthopedics;  Laterality: Right;  . KNEE ARTHROSCOPY WITH MEDIAL MENISECTOMY Right 05/29/2017   Procedure: KNEE ARTHROSCOPY WITH MEDIAL MENISECTOMY and lateral meniscectomy;  Surgeon: [name], MD;  Location: [facility];  Service: Orthopedics;  Laterality: Right;  . KNEE ARTHROSCOPY WITH MEDIAL MENISECTOMY Right  02/05/2018   Procedure: RIGHT KNEE ARTHROSCOPY WITH PARTIAL MEDIAL AND LATERAL MENISECTOMY;  Surgeon: [name], MD;  Location: [facility];  Service: Orthopedics;  Laterality: Right;  . TOTAL KNEE ARTHROPLASTY Right 03/28/2020   Procedure: TOTAL KNEE ARTHROPLASTY;  Surgeon: [name], MD;  Location: [facility];  Service: Orthopedics;  Laterality: Right;  . TOTAL KNEE REVISION Right 08/08/2020   Procedure: REVISION OF RIGHT TOTAL KNEE  TIBIAL COMPONENTS ONLY;  Surgeon: [name], MD;  Location: [facility];  Service: Orthopedics;  Laterality: Right;    There were no vitals filed for this visit.   Subjective Assessment - 09/27/20 0822    Subjective Pateitn states pain comes and goes now. He is using cructes again. This takes weight off and helps the pain he says.    Pertinent History RT TKA revision on 08/08/20    Limitations Standing;Walking;House hold activities    Patient Stated Goals Get more ROM    Currently in Pain? Yes    Pain Score 6     Pain Location Knee    Pain Orientation Right    Pain Descriptors / Indicators Aching    Pain Type Surgical pain    Pain Onset 1 to 4 weeks ago    Pain Frequency Intermittent                             [facility] Adult PT Treatment/Exercise -  09/27/20 0001      Knee/Hip Exercises: Stretches   Passive Hamstring Stretch Right;3 reps;30 seconds    Passive Hamstring Stretch Limitations seated    Gastroc Stretch Both;3 reps;30 seconds    Gastroc Stretch Limitations step      Knee/Hip Exercises: Standing   Heel Raises Both;2 sets;10 reps    Forward Step Up Right;2 sets;10 reps;Hand Hold: 2;Step Height: 4"    Step Down Right;2 sets;10 reps;Step Height: 4";Hand Hold: 1      Knee/Hip Exercises: Seated   Heel Slides Right;5 reps    Sit to Sand 10 reps;without UE support;1 set      Knee/Hip Exercises: Supine   Knee Extension AROM    Knee Extension Limitations 20    Knee Flexion AROM    Knee Flexion Limitations 91       Manual Therapy   Manual Therapy Passive ROM    Passive ROM RT knee PROM flexion and extension in seated and supine, contract relax flexion in sitting and seated 5 x 20" ea holds                    PT Short Term Goals - 08/29/20 0853      PT SHORT TERM GOAL #1   Title Patient will be independent with initial HEP and self-management strategies to improve functional outcomes    Time 2    Period Weeks    Status New    Target Date 09/15/20             PT Long Term Goals - 08/29/20 0853      PT LONG TERM GOAL #1   Title Patient will report at least 75% overall improvement in subjective complaint to indicate improvement in ability to perform ADLs.    Time 6    Period Weeks    Status New    Target Date 10/13/20      PT LONG TERM GOAL #2   Title Patient will have Rt knee AROM at least 5-120 degrees to improve functional mobility and facilitate squatting to pick up items from floor.    Time 6    Period Weeks    Status New    Target Date 10/13/20      PT LONG TERM GOAL #3   Title Patient will be able to ambulate at least 350 feet during 2MWT with LRAD to demonstrate improved ability to perform functional mobility and associated tasks.    Time 6    Period Weeks    Status New    Target Date 10/13/20                 Plan - 09/27/20 0858    Clinical Impression Statement Patient continues to be pain limited. He is making slow but steady progress with knee flexion AROM. He is very guarded with knee extension. Resumed functional strengthening with standing ther ex and step ups/ downs. Added sit to stands for LE strength progression with cues for even weight shifting. Patient was well challenged with this. Patient will continue to benefit from skilled therapy services to progress knee strength and mobility to reduce pain and improve functional mobility.    Personal Factors and Comorbidities Past/Current Experience    Examination-Activity Limitations Locomotion  Level;Stand;Stairs;Squat;Transfers    Examination-Participation Restrictions Yard Work;Community Activity;Cleaning    Stability/Clinical Decision Making Stable/Uncomplicated    Rehab Potential Good    PT Frequency 3x / week    PT Duration 6 weeks  PT Treatment/Interventions ADLs/Self Care Home Management;Aquatic Therapy;Biofeedback;Cryotherapy;Electrical Stimulation;Iontophoresis 4mg /ml Dexamethasone;Moist Heat;Traction;Balance training;Manual lymph drainage;Manual techniques;Therapeutic exercise;Therapeutic activities;Orthotic Fit/Training;Functional mobility training;Stair training;Gait training;DME Instruction;Patient/family education;Cognitive remediation;Scar mobilization;Passive range of motion;Visual/perceptual remediation/compensation;Compression bandaging;Neuromuscular re-education;Ultrasound;Parrafin;Contrast Bath;Fluidtherapy;Spinal Manipulations;Joint Manipulations;Dry needling;Energy conservation;Splinting;Taping;Vasopneumatic Device;Vestibular    PT Next Visit Plan Progress knee strength and mobility as tolerated. Focus on flexion/ extension AROM, progress strengthening as tolerated. Continue with STM and PROM manuals to improve AROM. Try to increase step height with up/ downs next visit. Gait training    PT Home Exercise Plan Eval: quad set, SLR, supine calf stretch 09/06/20: supine HS stretch, heel slide with strap 1/18 calf stretch, squat, step up 09/13/20: prone quad stretch, prone hangs 1/24 standing knee flexion, TKE 09/27/20 sit to stand, seated HS stretch    Consulted and Agree with Plan of Care Patient           Patient will benefit from skilled therapeutic intervention in order to improve the following deficits and impairments:  Abnormal gait,Hypomobility,Impaired flexibility,Decreased range of motion,Decreased balance,Difficulty walking,Decreased mobility,Decreased activity tolerance,Decreased strength,Increased fascial restricitons,Pain,Increased edema,Decreased scar  mobility  Visit Diagnosis: Right knee pain, unspecified chronicity  Stiffness of right knee, not elsewhere classified  Other abnormalities of gait and mobility     Problem List Patient Active Problem List   Diagnosis Date Noted  . S/P revision of total knee, right 08/08/20 08/08/2020  . Arthrofibrosis of knee joint, right   . 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  . S/P hardware removal right knee 09/21/19 10/04/2019  . Essential hypertension 04/07/2019  . Family history of early CAD 04/07/2019  . History of chest pain 03/10/2019  . Hyperlipidemia 03/10/2019  . Tobacco abuse 03/10/2019  . S/P right knee arthroscopy 02/05/18 06/23/2017  . S/P ACL repair right 05/19/18   . Chondromalacia of lateral femoral condyle, right   . Chondromalacia, patella, right     9:03 AM, 09/27/20 Victor [name] PT DPT  Physical Therapist with [facility]  [phone] [place] [address] [place], [place], [address] Phone: [phone]   Fax:  [phone]  Name: Victor [name] MRN: [id number] Date of Birth: 07-Oct-1975

## 2020-09-29 ENCOUNTER — Ambulatory Visit (HOSPITAL_COMMUNITY): Payer: Medicaid Other

## 2020-09-29 ENCOUNTER — Other Ambulatory Visit: Payer: Self-pay

## 2020-09-29 DIAGNOSIS — G8929 Other chronic pain: Secondary | ICD-10-CM

## 2020-09-29 DIAGNOSIS — M25561 Pain in right knee: Secondary | ICD-10-CM

## 2020-09-29 DIAGNOSIS — M25661 Stiffness of right knee, not elsewhere classified: Secondary | ICD-10-CM

## 2020-09-29 DIAGNOSIS — R2689 Other abnormalities of gait and mobility: Secondary | ICD-10-CM

## 2020-09-29 DIAGNOSIS — M6281 Muscle weakness (generalized): Secondary | ICD-10-CM | POA: Diagnosis not present

## 2020-09-29 MED ORDER — PREGABALIN 50 MG PO CAPS: 50.0000 mg | ORAL_CAPSULE | Freq: Three times a day (TID) | ORAL | 2 refills | Status: DC

## 2020-09-29 MED ORDER — METHOCARBAMOL 500 MG PO TABS
500.0000 mg | ORAL_TABLET | Freq: Four times a day (QID) | ORAL | 0 refills | Status: DC | PRN
Start: 2020-09-29 — End: 2020-10-06

## 2020-09-29 NOTE — Therapy (Signed)
[facility] [address] [place], [place], [address] Phone: [phone]   Fax:  [phone]  Physical Therapy Treatment and Progress Note  Patient Details  Name: Victor [name] MRN: [id number] Date of Birth: 07-Nov-1975 Referring Provider (PT): Arther Abbott MD   Encounter Date: 09/29/2020   PT End of Session - 09/29/20 0909    Visit Number 10    Number of Visits 18    Date for PT Re-Evaluation 10/13/20    Authorization Type [place] Medicaid Healthy Blue    Authorization Time Period 08/30/20-10/13/20    Authorization - Visit Number 9    Authorization - Number of Visits 20    Progress Note Due on Visit 20    PT Start Time 0900    PT Stop Time 0945    PT Time Calculation (min) 45 min    Activity Tolerance Patient tolerated treatment well;Patient limited by pain    Behavior During Therapy [facility] for tasks assessed/performed           Past Medical History:  Diagnosis Date  . GERD (gastroesophageal reflux disease)   . History of stomach ulcers   . Hypertension     Past Surgical History:  Procedure Laterality Date  . ANTERIOR CRUCIATE LIGAMENT REPAIR Right 05/19/2018   Procedure: RIGHT KNEE ARTHROSCOPY WITH ANTERIOR CRUCIATE LIGAMENT (ACL) REPAIR and MEDIAL MENISECTOMY;  Surgeon: [name], MD;  Location: [facility];  Service: Orthopedics;  Laterality: Right;  . APPENDECTOMY    . HARDWARE REMOVAL Right 09/21/2019   Procedure: HARDWARE REMOVAL (RICHARD'S STAPLE) RIGHT KNEE;  Surgeon: [name], MD;  Location: [facility];  Service: Orthopedics;  Laterality: Right;  . KNEE ARTHROSCOPY WITH MEDIAL MENISECTOMY Right 05/29/2017   Procedure: KNEE ARTHROSCOPY WITH MEDIAL MENISECTOMY and lateral meniscectomy;  Surgeon: [name], MD;  Location: [facility];  Service: Orthopedics;  Laterality: Right;  . KNEE ARTHROSCOPY WITH MEDIAL MENISECTOMY Right 02/05/2018   Procedure: RIGHT KNEE ARTHROSCOPY WITH PARTIAL MEDIAL AND LATERAL  MENISECTOMY;  Surgeon: [name], MD;  Location: [facility];  Service: Orthopedics;  Laterality: Right;  . TOTAL KNEE ARTHROPLASTY Right 03/28/2020   Procedure: TOTAL KNEE ARTHROPLASTY;  Surgeon: [name], MD;  Location: [facility];  Service: Orthopedics;  Laterality: Right;  . TOTAL KNEE REVISION Right 08/08/2020   Procedure: REVISION OF RIGHT TOTAL KNEE  TIBIAL COMPONENTS ONLY;  Surgeon: [name], MD;  Location: [facility];  Service: Orthopedics;  Laterality: Right;   Progress Note Reporting Period 08/29/20 to 09/29/20  See note below for Objective Data and Assessment of Progress/Goals.      There were no vitals filed for this visit.   Subjective Assessment - 09/29/20 0909    Subjective COntinued knee stiffness and reports ortho MD has referenced seeing a specialist for the current issue    Pertinent History RT TKA revision on 08/08/20    Limitations Standing;Walking;House hold activities    Patient Stated Goals Get more ROM    Pain Onset 1 to 4 weeks ago              [facility] PT Assessment - 09/29/20 0001      AROM   Right Knee Extension 21   lacking   Right Knee Flexion 77   85 after manual therapy                        [facility] Adult PT Treatment/Exercise - 09/29/20 0001  Knee/Hip Exercises: Aerobic   Stationary Bike bike x 6 min for AAROM      Knee/Hip Exercises: Supine   Terminal Knee Extension AAROM;Strengthening   3x2 min with 10 lbs above and below knee   Other Supine Knee/Hip Exercises knee flexion hang with 10 lbs 3x2 min      Manual Therapy   Manual Therapy Joint mobilization    Manual therapy comments completed seperately from all other skilled interventions    Joint Mobilization long axis distraction grade 2 and grade 3 to improve extension ROM. Ant-posterior glides to increase flexion to extension. Grade 4 oscillations posterior to anterior to improve extension/translation. IMproved knee flexion to 85 degrees after manual                   PT Education - 09/29/20 0950    Education Details educated on benefits of [place] NMES for quadriceps.  Educated on aquatic therapy (backwards walking in shallow water)    Person(s) Educated Patient    Methods Explanation    Comprehension Verbalized understanding            PT Short Term Goals - 09/29/20 0955      PT SHORT TERM GOAL #1   Title Patient will be independent with initial HEP and self-management strategies to improve functional outcomes    Time 2    Period Weeks    Status On-going    Target Date 09/15/20             PT Long Term Goals - 09/29/20 1017      PT LONG TERM GOAL #1   Title Patient will report at least 75% overall improvement in subjective complaint to indicate improvement in ability to perform ADLs.    Time 6    Period Weeks    Status On-going      PT LONG TERM GOAL #2   Title Patient will have Rt knee AROM at least 5-120 degrees to improve functional mobility and facilitate squatting to pick up items from floor.    Time 6    Period Weeks    Status On-going      PT LONG TERM GOAL #3   Title Patient will be able to ambulate at least 350 feet during 2MWT with LRAD to demonstrate improved ability to perform functional mobility and associated tasks.    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  Benefit from manual therapy with increased AROM after session.  Will attempt [place] e-stim to right quad to facilitate stronger contraction and motor control    Personal Factors and Comorbidities Past/Current Experience    Examination-Activity Limitations Locomotion Level;Stand;Stairs;Squat;Transfers    Examination-Participation Restrictions Yard Work;Community Activity;Cleaning    Stability/Clinical Decision Making Stable/Uncomplicated    Rehab Potential Good    PT  Frequency 3x / week    PT Duration 6 weeks    PT Treatment/Interventions ADLs/Self Care Home Management;Aquatic Therapy;Biofeedback;Cryotherapy;Electrical Stimulation;Iontophoresis 4mg /ml Dexamethasone;Moist Heat;Traction;Balance training;Manual lymph drainage;Manual techniques;Therapeutic exercise;Therapeutic activities;Orthotic Fit/Training;Functional mobility training;Stair training;Gait training;DME Instruction;Patient/family education;Cognitive remediation;Scar mobilization;Passive range of motion;Visual/perceptual remediation/compensation;Compression bandaging;Neuromuscular re-education;Ultrasound;Parrafin;Contrast Bath;Fluidtherapy;Spinal Manipulations;Joint Manipulations;Dry needling;Energy conservation;Splinting;Taping;Vasopneumatic Device;Vestibular    PT Next Visit Plan Progress knee strength and mobility as tolerated. Focus on flexion/ extension AROM, progress strengthening as tolerated. Continue with  STM and PROM manuals to improve AROM. Try to increase step height with up/ downs next visit. Gait training. [place] E-STIM RIGHT QUADS    PT Home Exercise Plan Eval: quad set, SLR, supine calf stretch 09/06/20: supine HS stretch, heel slide with strap 1/18 calf stretch, squat, step up 09/13/20: prone quad stretch, prone hangs 1/24 standing knee flexion, TKE 09/27/20 sit to stand, seated HS stretch    Consulted and Agree with Plan of Care Patient           Patient will benefit from skilled therapeutic intervention in order to improve the following deficits and impairments:  Abnormal gait,Hypomobility,Impaired flexibility,Decreased range of motion,Decreased balance,Difficulty walking,Decreased mobility,Decreased activity tolerance,Decreased strength,Increased fascial restricitons,Pain,Increased edema,Decreased scar mobility  Visit Diagnosis: No diagnosis found.     Problem List Patient Active Problem List   Diagnosis Date Noted  . S/P revision of total knee, right 08/08/20 08/08/2020  .  Arthrofibrosis of knee joint, right   . 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  . S/P hardware removal right knee 09/21/19 10/04/2019  . Essential hypertension 04/07/2019  . Family history of early CAD 04/07/2019  . History of chest pain 03/10/2019  . Hyperlipidemia 03/10/2019  . Tobacco abuse 03/10/2019  . S/P right knee arthroscopy 02/05/18 06/23/2017  . S/P ACL repair right 05/19/18   . Chondromalacia of lateral femoral condyle, right   . Chondromalacia, patella, right    10:20 AM, 09/29/20 M. Sherlyn Lees, PT, DPT Physical Therapist- [place] Office Number: [phone]  [place] [address] [place], [place], [address] Phone: [phone]   Fax:  [phone]  Name: Koran Seabrook Tien MRN: [id number] Date of Birth: August 30, 1975

## 2020-10-02 ENCOUNTER — Ambulatory Visit (HOSPITAL_COMMUNITY): Payer: Medicaid Other | Admitting: Physical Therapy

## 2020-10-04 ENCOUNTER — Ambulatory Visit (INDEPENDENT_AMBULATORY_CARE_PROVIDER_SITE_OTHER): Payer: Medicaid Other | Admitting: Orthopedic Surgery

## 2020-10-04 ENCOUNTER — Ambulatory Visit (HOSPITAL_COMMUNITY): Payer: Medicaid Other | Admitting: Physical Therapy

## 2020-10-04 ENCOUNTER — Other Ambulatory Visit: Payer: Self-pay

## 2020-10-04 ENCOUNTER — Encounter: Payer: Self-pay | Admitting: Orthopedic Surgery

## 2020-10-04 ENCOUNTER — Encounter (HOSPITAL_COMMUNITY): Payer: Self-pay | Admitting: Physical Therapy

## 2020-10-04 VITALS — Ht 71.0 in | Wt 240.0 lb

## 2020-10-04 DIAGNOSIS — M25561 Pain in right knee: Secondary | ICD-10-CM | POA: Diagnosis not present

## 2020-10-04 DIAGNOSIS — R2689 Other abnormalities of gait and mobility: Secondary | ICD-10-CM

## 2020-10-04 DIAGNOSIS — M6281 Muscle weakness (generalized): Secondary | ICD-10-CM | POA: Diagnosis not present

## 2020-10-04 DIAGNOSIS — Z96651 Presence of right artificial knee joint: Secondary | ICD-10-CM

## 2020-10-04 DIAGNOSIS — M25661 Stiffness of right knee, not elsewhere classified: Secondary | ICD-10-CM | POA: Diagnosis not present

## 2020-10-04 DIAGNOSIS — G8929 Other chronic pain: Secondary | ICD-10-CM | POA: Diagnosis not present

## 2020-10-04 MED ORDER — HYDROCODONE-ACETAMINOPHEN 10-325 MG PO TABS: 1.0000 | ORAL_TABLET | ORAL | 0 refills | Status: DC | PRN

## 2020-10-04 NOTE — Therapy (Signed)
[place], [place], [address] Phone: [phone]   Fax:  [phone]  Physical Therapy Treatment  Patient Details  Name: Victor [name] MRN: [id number] Date of Birth: 08-29-1975 Referring Provider (PT): Arther Abbott MD  Knee/Hip Exercises: Supine  Knee Extension AROM   Knee Extension Limitations 20   lacking  Knee Flexion AROM   Knee Flexion Limitations 91     Encounter Date: 10/04/2020   PT End of Session - 10/04/20 0828    Visit Number 11    Number of Visits 18    Date for PT Re-Evaluation 10/13/20    Authorization Type [place] [employer]    Authorization Time Period 08/30/20-10/13/20    Authorization - Visit Number 10    Authorization - Number of Visits 20    Progress Note Due on Visit 20    PT Start Time 0820    PT Stop Time 0910    PT Time Calculation (min) 50 min    Activity Tolerance Patient tolerated treatment well;Patient limited by pain    Behavior During Therapy [facility] for tasks assessed/performed           Past Medical History:  Diagnosis Date  . GERD (gastroesophageal reflux disease)   . History of stomach ulcers   . Hypertension     Past Surgical History:  Procedure Laterality Date  . ANTERIOR CRUCIATE LIGAMENT REPAIR Right 05/19/2018   Procedure: RIGHT KNEE ARTHROSCOPY WITH ANTERIOR CRUCIATE LIGAMENT (ACL) REPAIR and MEDIAL MENISECTOMY;  Surgeon: [name], MD;  Location: [facility];  Service: Orthopedics;  Laterality: Right;  . APPENDECTOMY    . HARDWARE REMOVAL Right 09/21/2019   Procedure: HARDWARE REMOVAL (RICHARD'S STAPLE) RIGHT KNEE;  Surgeon: [name], MD;  Location: [facility];  Service: Orthopedics;  Laterality: Right;  . KNEE ARTHROSCOPY WITH MEDIAL MENISECTOMY Right 05/29/2017   Procedure: KNEE ARTHROSCOPY WITH MEDIAL MENISECTOMY and lateral meniscectomy;  Surgeon: [name], MD;  Location: [facility];  Service: Orthopedics;  Laterality: Right;  . KNEE  ARTHROSCOPY WITH MEDIAL MENISECTOMY Right 02/05/2018   Procedure: RIGHT KNEE ARTHROSCOPY WITH PARTIAL MEDIAL AND LATERAL MENISECTOMY;  Surgeon: [name], MD;  Location: [facility];  Service: Orthopedics;  Laterality: Right;  . TOTAL KNEE ARTHROPLASTY Right 03/28/2020   Procedure: TOTAL KNEE ARTHROPLASTY;  Surgeon: [name], MD;  Location: [facility];  Service: Orthopedics;  Laterality: Right;  . TOTAL KNEE REVISION Right 08/08/2020   Procedure: REVISION OF RIGHT TOTAL KNEE  TIBIAL COMPONENTS ONLY;  Surgeon: [name], MD;  Location: [facility];  Service: Orthopedics;  Laterality: Right;    There were no vitals filed for this visit.   Subjective Assessment - 10/04/20 0826    Subjective Patient reports he is in continued pain. He is concerned about scar tissue buildup. He doesn't feel the pain medicine is helping much.    Pertinent History RT TKA revision on 08/08/20    Limitations Standing;Walking;House hold activities    Patient Stated Goals Get more ROM    Currently in Pain? Yes    Pain Score 6     Pain Location Knee    Pain Orientation Right    Pain Descriptors / Indicators Aching    Pain Type Surgical pain    Pain Onset 1 to 4 weeks ago    Pain Frequency Constant    Aggravating Factors  WB, walking    Pain Relieving Factors ice, pain meds  Effect of Pain on Daily Activities Limits                             [facility] Adult PT Treatment/Exercise - 10/04/20 0001      Knee/Hip Exercises: Stretches   Gastroc Stretch Both;3 reps;30 seconds    Gastroc Stretch Limitations slant board      Knee/Hip Exercises: Aerobic   Recumbent Bike 4 min seat 13 rocking for mobility      Knee/Hip Exercises: Machines for Strengthening   Cybex Knee Flexion DL 2 x 10 45# (offset at #3)    Cybex Leg Press DL 2 x 10 30#    Other Machine TKE 30# 15 x 3" hold      Knee/Hip Exercises: Supine   Knee Extension AROM    Knee Extension Limitations 20   lacking   Knee  Flexion AROM    Knee Flexion Limitations 91      Manual Therapy   Manual Therapy Passive ROM    Manual therapy comments completed seperately from all other skilled interventions    Passive ROM RT knee PROM flexion and extension in seated and supine, contract relax flexion in sitting and seated, contract relax knee extension in prone 5 x 20" ea holds                    PT Short Term Goals - 09/29/20 0955      PT SHORT TERM GOAL #1   Title Patient will be independent with initial HEP and self-management strategies to improve functional outcomes    Time 2    Period Weeks    Status On-going    Target Date 09/15/20             PT Long Term Goals - 09/29/20 1017      PT LONG TERM GOAL #1   Title Patient will report at least 75% overall improvement in subjective complaint to indicate improvement in ability to perform ADLs.    Time 6    Period Weeks    Status On-going      PT LONG TERM GOAL #2   Title Patient will have Rt knee AROM at least 5-120 degrees to improve functional mobility and facilitate squatting to pick up items from floor.    Time 6    Period Weeks    Status On-going      PT LONG TERM GOAL #3   Title Patient will be able to ambulate at least 350 feet during 2MWT with LRAD to demonstrate improved ability to perform functional mobility and associated tasks.    Time 6    Period Weeks    Status On-going                 Plan - 10/04/20 4403    Clinical Impression Statement Patient continues to struggle with RT knee AROM due to pain. He does show improved flexion after manual stretching and is able to get to about 95 degrees passively. His extension remains constant at 20 degrees lacking. Patient does show fairly good knee flexion and extension strength through available ROM. Progressed LE strengthening and hamstring activation with added machine leg curls for strength and mobility. Patient cued on proper form and controlled eccentric lowering. Patient  will continue to benefit from skilled therapy services to progress knee strength and mobility for reduced pain and improved functional mobility.    Personal Factors and Comorbidities Past/Current Experience  Examination-Activity Limitations Locomotion Level;Stand;Stairs;Squat;Transfers    Examination-Participation Restrictions Yard Work;Community Activity;Cleaning    Stability/Clinical Decision Making Stable/Uncomplicated    Rehab Potential Good    PT Frequency 3x / week    PT Duration 6 weeks    PT Treatment/Interventions ADLs/Self Care Home Management;Aquatic Therapy;Biofeedback;Cryotherapy;Electrical Stimulation;Iontophoresis 4mg /ml Dexamethasone;Moist Heat;Traction;Balance training;Manual lymph drainage;Manual techniques;Therapeutic exercise;Therapeutic activities;Orthotic Fit/Training;Functional mobility training;Stair training;Gait training;DME Instruction;Patient/family education;Cognitive remediation;Scar mobilization;Passive range of motion;Visual/perceptual remediation/compensation;Compression bandaging;Neuromuscular re-education;Ultrasound;Parrafin;Contrast Bath;Fluidtherapy;Spinal Manipulations;Joint Manipulations;Dry needling;Energy conservation;Splinting;Taping;Vasopneumatic Device;Vestibular    PT Next Visit Plan Progress knee strength and mobility as tolerated. Focus on flexion/ extension AROM, progress strengthening as tolerated. Continue with STM and PROM manuals to improve AROM. Try to increase step height with up/ downs next visit. Gait training. RUSSIAN E-STIM RIGHT QUADS    PT Home Exercise Plan Eval: quad set, SLR, supine calf stretch 09/06/20: supine HS stretch, heel slide with strap 1/18 calf stretch, squat, step up 09/13/20: prone quad stretch, prone hangs 1/24 standing knee flexion, TKE 09/27/20 sit to stand, seated HS stretch    Consulted and Agree with Plan of Care Patient           Patient will benefit from skilled therapeutic intervention in order to improve the  following deficits and impairments:  Abnormal gait,Hypomobility,Impaired flexibility,Decreased range of motion,Decreased balance,Difficulty walking,Decreased mobility,Decreased activity tolerance,Decreased strength,Increased fascial restricitons,Pain,Increased edema,Decreased scar mobility  Visit Diagnosis: Right knee pain, unspecified chronicity  Stiffness of right knee, not elsewhere classified  Other abnormalities of gait and mobility     Problem List Patient Active Problem List   Diagnosis Date Noted  . S/P revision of total knee, right 08/08/20 08/08/2020  . Arthrofibrosis of knee joint, right   . 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  . S/P hardware removal right knee 09/21/19 10/04/2019  . Essential hypertension 04/07/2019  . Family history of early CAD 04/07/2019  . History of chest pain 03/10/2019  . Hyperlipidemia 03/10/2019  . Tobacco abuse 03/10/2019  . S/P right knee arthroscopy 02/05/18 06/23/2017  . S/P ACL repair right 05/19/18   . Chondromalacia of lateral femoral condyle, right   . Chondromalacia, patella, right     9:44 AM, 10/04/20 Josue Hector PT DPT  Physical Therapist with [facility]  [phone] [place] [address] [place], [place], [address] Phone: [phone]   Fax:  [phone]  Name: Nam Vossler Carl MRN: [id number] Date of Birth: [birth year]

## 2020-10-04 NOTE — Progress Notes (Signed)
Chief Complaint  Patient presents with  . Routine Post Op    Rt knee DOS 08/08/20  . Medication Refill    Hydrocodone    Postop week 8 status post revision of right total knee tibial component due to loss of flexion and extension   Current Outpatient Medications:  .  aspirin EC 325 MG EC tablet, Take 1 tablet (325 mg total) by mouth daily with breakfast., Disp: 30 tablet, Rfl: 0 .  HYDROcodone-acetaminophen (NORCO) 10-325 MG tablet, Take 1 tablet by mouth every 4 (four) hours as needed., Disp: 42 tablet, Rfl: 0 .  methocarbamol (ROBAXIN) 500 MG tablet, Take 1 tablet (500 mg total) by mouth every 6 (six) hours as needed for muscle spasms., Disp: 30 tablet, Rfl: 0 .  polyethylene glycol (MIRALAX / GLYCOLAX) 17 g packet, Take 17 g by mouth daily., Disp: 14 each, Rfl: 0 .  pregabalin (LYRICA) 50 MG capsule, Take 1 capsule (50 mg total) by mouth 3 (three) times daily., Disp: 30 capsule, Rfl: 2 .  amLODipine (NORVASC) 10 MG tablet, Take 1 tablet (10 mg total) by mouth daily., Disp: 90 tablet, Rfl: 3 .  docusate sodium (COLACE) 100 MG capsule, Take 1 capsule (100 mg total) by mouth 2 (two) times daily. (Patient not taking: Reported on 10/04/2020), Disp: 10 capsule, Rfl: 0  He c/o losing motion again   Patient's flexion is 90 extension is 20  Patellar mobility is poor  Hamstrings are extremely tight  Recommend continue therapy  Let scar tissue mature  4 weeks come back discuss referral for joint replacement surgeon to opine on whether he needs a revision Meds ordered this encounter  Medications  . HYDROcodone-acetaminophen (NORCO) 10-325 MG tablet    Sig: Take 1 tablet by mouth every 4 (four) hours as needed.    Dispense:  42 tablet    Refill:  0

## 2020-10-05 DIAGNOSIS — Z96651 Presence of right artificial knee joint: Secondary | ICD-10-CM

## 2020-10-06 ENCOUNTER — Ambulatory Visit (HOSPITAL_COMMUNITY): Payer: Medicaid Other

## 2020-10-06 ENCOUNTER — Other Ambulatory Visit: Payer: Self-pay

## 2020-10-06 DIAGNOSIS — G8929 Other chronic pain: Secondary | ICD-10-CM

## 2020-10-06 DIAGNOSIS — M25661 Stiffness of right knee, not elsewhere classified: Secondary | ICD-10-CM

## 2020-10-06 DIAGNOSIS — R2689 Other abnormalities of gait and mobility: Secondary | ICD-10-CM | POA: Diagnosis not present

## 2020-10-06 DIAGNOSIS — M25561 Pain in right knee: Secondary | ICD-10-CM | POA: Diagnosis not present

## 2020-10-06 DIAGNOSIS — M6281 Muscle weakness (generalized): Secondary | ICD-10-CM

## 2020-10-06 MED ORDER — METHOCARBAMOL 500 MG PO TABS
500.0000 mg | ORAL_TABLET | Freq: Four times a day (QID) | ORAL | 0 refills | Status: DC | PRN
Start: 2020-10-06 — End: 2020-10-13

## 2020-10-06 NOTE — Therapy (Signed)
[place], [place], [address] Phone: [phone]   Fax:  [phone]  Physical Therapy Treatment  Patient Details  Name: Victor [name] MRN: [id number] Date of Birth: Nov 16, 1975 Referring Provider (PT): Arther Abbott MD   Encounter Date: 10/06/2020   PT End of Session - 10/06/20 0820    Visit Number 12    Number of Visits 18    Date for PT Re-Evaluation 10/13/20    Authorization Type [place] [employer]    Authorization Time Period 08/30/20-10/13/20    Authorization - Visit Number 11    Authorization - Number of Visits 20    Progress Note Due on Visit 20    PT Start Time 0814    PT Stop Time 0905    PT Time Calculation (min) 51 min    Activity Tolerance Patient tolerated treatment well;Patient limited by pain    Behavior During Therapy [facility] for tasks assessed/performed           Past Medical History:  Diagnosis Date  . GERD (gastroesophageal reflux disease)   . History of stomach ulcers   . Hypertension     Past Surgical History:  Procedure Laterality Date  . ANTERIOR CRUCIATE LIGAMENT REPAIR Right 05/19/2018   Procedure: RIGHT KNEE ARTHROSCOPY WITH ANTERIOR CRUCIATE LIGAMENT (ACL) REPAIR and MEDIAL MENISECTOMY;  Surgeon: [name], MD;  Location: [facility];  Service: Orthopedics;  Laterality: Right;  . APPENDECTOMY    . HARDWARE REMOVAL Right 09/21/2019   Procedure: HARDWARE REMOVAL (RICHARD'S STAPLE) RIGHT KNEE;  Surgeon: [name], MD;  Location: [facility];  Service: Orthopedics;  Laterality: Right;  . KNEE ARTHROSCOPY WITH MEDIAL MENISECTOMY Right 05/29/2017   Procedure: KNEE ARTHROSCOPY WITH MEDIAL MENISECTOMY and lateral meniscectomy;  Surgeon: [name], MD;  Location: [facility];  Service: Orthopedics;  Laterality: Right;  . KNEE ARTHROSCOPY WITH MEDIAL MENISECTOMY Right 02/05/2018   Procedure: RIGHT KNEE ARTHROSCOPY WITH PARTIAL MEDIAL AND LATERAL MENISECTOMY;  Surgeon:  [name], MD;  Location: [facility];  Service: Orthopedics;  Laterality: Right;  . TOTAL KNEE ARTHROPLASTY Right 03/28/2020   Procedure: TOTAL KNEE ARTHROPLASTY;  Surgeon: [name], MD;  Location: [facility];  Service: Orthopedics;  Laterality: Right;  . TOTAL KNEE REVISION Right 08/08/2020   Procedure: REVISION OF RIGHT TOTAL KNEE  TIBIAL COMPONENTS ONLY;  Surgeon: [name], MD;  Location: [facility];  Service: Orthopedics;  Laterality: Right;    There were no vitals filed for this visit.   Subjective Assessment - 10/06/20 0817    Subjective Pt had ortho MD appointment yesterday and reports he was not very optimistic about his outcomes and has asked for him to return in 4 weeks (11/01/20) for further assessment and discussion    Pertinent History RT TKA revision on 08/08/20    Limitations Standing;Walking;House hold activities    Patient Stated Goals Get more ROM    Currently in Pain? Yes    Pain Score 6     Pain Location Knee    Pain Orientation Right    Pain Descriptors / Indicators Aching    Pain Onset 1 to 4 weeks ago    Pain Relieving Factors ice, pain meds    Effect of Pain on Daily Activities limits              [facility] PT Assessment - 10/06/20 0001      Assessment   Medical Diagnosis RT TKA revision  Referring Provider (PT) Arther Abbott MD                         [facility] Adult PT Treatment/Exercise - 10/06/20 0001      Knee/Hip Exercises: Stretches   Active Hamstring Stretch Right;3 reps;10 seconds      Knee/Hip Exercises: Aerobic   Recumbent Bike 6 min seat 13 rocking for mobility      Knee/Hip Exercises: Standing   Forward Lunges Right;2 sets;10 reps;5 seconds   elevated on 8" step and therapist with mobilizing strap pulling for anterior tibial translation     Knee/Hip Exercises: Supine   Quad Sets Strengthening;Right;3 sets;10 reps    Knee Extension AROM    Knee Extension Limitations 25-30 lacking    Knee Flexion AROM     Knee Flexion Limitations 85    Other Supine Knee/Hip Exercises contract-relax with quads 3x3 sec followed by therapist stretching for knee flexion x 10 sec 5 rounds    Other Supine Knee/Hip Exercises oscillation bounce on green stability ball 3x2 min to promote relaxation and extension mobilization                  PT Education - 10/06/20 0857    Education Details educated on contined stretching, especially active hamstring to reduce co-cocontraction and facilitate quad activiation    Person(s) Educated Patient    Methods Explanation    Comprehension Verbalized understanding            PT Short Term Goals - 09/29/20 0955      PT SHORT TERM GOAL #1   Title Patient will be independent with initial HEP and self-management strategies to improve functional outcomes    Time 2    Period Weeks    Status On-going    Target Date 09/15/20             PT Long Term Goals - 09/29/20 1017      PT LONG TERM GOAL #1   Title Patient will report at least 75% overall improvement in subjective complaint to indicate improvement in ability to perform ADLs.    Time 6    Period Weeks    Status On-going      PT LONG TERM GOAL #2   Title Patient will have Rt knee AROM at least 5-120 degrees to improve functional mobility and facilitate squatting to pick up items from floor.    Time 6    Period Weeks    Status On-going      PT LONG TERM GOAL #3   Title Patient will be able to ambulate at least 350 feet during 2MWT with LRAD to demonstrate improved ability to perform functional mobility and associated tasks.    Time 6    Period Weeks    Status On-going                 Plan - 10/06/20 5284    Clinical Impression Statement Continued difficulty with knee ROM and very limited patellar mobility appreciated.  Poor quad control evident and co-contraction between right quad/hamstrings appreciated when performing compound movements with RLE.  Continued services indicated to improve knee  function and ROM to facilitate normalized gait pattern    Personal Factors and Comorbidities Past/Current Experience    Comorbidities HTN, previous right knee scope and ACL reconstruction, HTN.    Examination-Activity Limitations Locomotion Level;Stand;Stairs;Squat;Transfers    Examination-Participation Restrictions Yard Work;Community Activity;Cleaning    Stability/Clinical Decision Making Stable/Uncomplicated  Rehab Potential Good    PT Frequency 3x / week    PT Duration 6 weeks    PT Treatment/Interventions ADLs/Self Care Home Management;Aquatic Therapy;Biofeedback;Cryotherapy;Electrical Stimulation;Iontophoresis 4mg /ml Dexamethasone;Moist Heat;Traction;Balance training;Manual lymph drainage;Manual techniques;Therapeutic exercise;Therapeutic activities;Orthotic Fit/Training;Functional mobility training;Stair training;Gait training;DME Instruction;Patient/family education;Cognitive remediation;Scar mobilization;Passive range of motion;Visual/perceptual remediation/compensation;Compression bandaging;Neuromuscular re-education;Ultrasound;Parrafin;Contrast Bath;Fluidtherapy;Spinal Manipulations;Joint Manipulations;Dry needling;Energy conservation;Splinting;Taping;Vasopneumatic Device;Vestibular    PT Next Visit Plan Progress knee strength and mobility as tolerated. Focus on flexion/ extension AROM, progress strengthening as tolerated. Continue with STM and PROM manuals to improve AROM. Try to increase step height with up/ downs next visit. Gait training. RUSSIAN E-STIM RIGHT QUADS    PT Home Exercise Plan Eval: quad set, SLR, supine calf stretch 09/06/20: supine HS stretch, heel slide with strap 1/18 calf stretch, squat, step up 09/13/20: prone quad stretch, prone hangs 1/24 standing knee flexion, TKE 09/27/20 sit to stand, seated HS stretch    Consulted and Agree with Plan of Care Patient           Patient will benefit from skilled therapeutic intervention in order to improve the following deficits  and impairments:  Abnormal gait,Hypomobility,Impaired flexibility,Decreased range of motion,Decreased balance,Difficulty walking,Decreased mobility,Decreased activity tolerance,Decreased strength,Increased fascial restricitons,Pain,Increased edema,Decreased scar mobility  Visit Diagnosis: Right knee pain, unspecified chronicity  Stiffness of right knee, not elsewhere classified  Other abnormalities of gait and mobility  Chronic pain of right knee  Muscle weakness (generalized)     Problem List Patient Active Problem List   Diagnosis Date Noted  . S/P revision of total knee, right 08/08/20 08/08/2020  . Arthrofibrosis of knee joint, right   . 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  . S/P hardware removal right knee 09/21/19 10/04/2019  . Essential hypertension 04/07/2019  . Family history of early CAD 04/07/2019  . History of chest pain 03/10/2019  . Hyperlipidemia 03/10/2019  . Tobacco abuse 03/10/2019  . S/P right knee arthroscopy 02/05/18 06/23/2017  . S/P ACL repair right 05/19/18   . Chondromalacia of lateral femoral condyle, right   . Chondromalacia, patella, right     9:20 AM, 10/06/20 M. Sherlyn Lees, PT, DPT Physical Therapist- [place] Office Number: [phone]  [place] [address] [place], [place], [address] Phone: [phone]   Fax:  [phone]  Name: Odyn Turko Durio MRN: [id number] Date of Birth: 20-Jul-1976

## 2020-10-09 DIAGNOSIS — Z96651 Presence of right artificial knee joint: Secondary | ICD-10-CM

## 2020-10-10 MED ORDER — HYDROCODONE-ACETAMINOPHEN 10-325 MG PO TABS: 1.0000 | ORAL_TABLET | ORAL | 0 refills | Status: DC | PRN

## 2020-10-10 MED ORDER — PREGABALIN 50 MG PO CAPS: 50.0000 mg | ORAL_CAPSULE | Freq: Three times a day (TID) | ORAL | 2 refills | Status: DC

## 2020-10-11 DIAGNOSIS — G8929 Other chronic pain: Secondary | ICD-10-CM

## 2020-10-11 DIAGNOSIS — Z96651 Presence of right artificial knee joint: Secondary | ICD-10-CM

## 2020-10-11 NOTE — Telephone Encounter (Signed)
-----  Message from [name], MD sent at 10/11/2020 12:13 PM EST ----- Can we get him to have his lab work done before his next visit   [place]

## 2020-10-13 LAB — CBC WITH DIFFERENTIAL/PLATELET
Absolute Monocytes: 670 cells/uL (ref 200–950)
Basophils Absolute: 54 cells/uL (ref 0–200)
Basophils Relative: 0.7 %
Eosinophils Absolute: 131 cells/uL (ref 15–500)
Eosinophils Relative: 1.7 %
HCT: 44.9 % (ref 38.5–50.0)
Hemoglobin: 15.2 g/dL (ref 13.2–17.1)
Lymphs Abs: 2957 cells/uL (ref 850–3900)
MCH: 29.7 pg (ref 27.0–33.0)
MCHC: 33.9 g/dL (ref 32.0–36.0)
MCV: 87.9 fL (ref 80.0–100.0)
MPV: 10 fL (ref 7.5–12.5)
Monocytes Relative: 8.7 %
Neutro Abs: 3889 cells/uL (ref 1500–7800)
Neutrophils Relative %: 50.5 %
Platelets: 310 10*3/uL (ref 140–400)
RBC: 5.11 10*6/uL (ref 4.20–5.80)
RDW: 13.3 % (ref 11.0–15.0)
Total Lymphocyte: 38.4 %
WBC: 7.7 10*3/uL (ref 3.8–10.8)

## 2020-10-13 LAB — SEDIMENTATION RATE: Sed Rate: 2 mm/h (ref 0–15)

## 2020-10-13 LAB — C-REACTIVE PROTEIN: CRP: 3.8 mg/L (ref <8.0)

## 2020-10-13 MED ORDER — METHOCARBAMOL 500 MG PO TABS
500.0000 mg | ORAL_TABLET | Freq: Four times a day (QID) | ORAL | 0 refills | Status: AC | PRN
Start: 2020-10-13 — End: ?

## 2020-10-13 NOTE — Addendum Note (Signed)
Addended byCandice Camp on: 10/13/2020 10:52 AM   Modules accepted: Orders

## 2020-10-16 ENCOUNTER — Encounter: Payer: Self-pay | Admitting: Orthopedic Surgery

## 2020-10-16 ENCOUNTER — Other Ambulatory Visit: Payer: Self-pay

## 2020-10-16 ENCOUNTER — Ambulatory Visit: Payer: Medicaid Other

## 2020-10-16 ENCOUNTER — Ambulatory Visit (INDEPENDENT_AMBULATORY_CARE_PROVIDER_SITE_OTHER): Payer: Medicaid Other | Admitting: Orthopedic Surgery

## 2020-10-16 DIAGNOSIS — M25561 Pain in right knee: Secondary | ICD-10-CM

## 2020-10-16 DIAGNOSIS — G8929 Other chronic pain: Secondary | ICD-10-CM

## 2020-10-16 DIAGNOSIS — Z96651 Presence of right artificial knee joint: Secondary | ICD-10-CM

## 2020-10-16 MED ORDER — HYDROCODONE-ACETAMINOPHEN 10-325 MG PO TABS: 1.0000 | ORAL_TABLET | ORAL | 0 refills | Status: DC | PRN

## 2020-10-16 NOTE — Progress Notes (Signed)
Chief Complaint  Patient presents with  . Post-op Follow-up    Right knee revision 08/08/20 has pain decreased ROM PT has stopped due to lack of progress    45 year old male status post right total knee and then a revision right total knee index procedure April 07, 2020 revision 08/08/2020 revised for stiffness  Patient's motion has regressed  Patient's pain despite being on hydrocodone 10 mg every 6 and muscle relaxers and ibuprofen and Lyrica  About a minute after labs were done CBC sed rate C-reactive protein all normal  CRP 3.8 white count 7.7 neutrophils 50% sed rate 2  Knee is basically at 20-70 range of motion no signs of infection externally  Recommend x-ray  Recommend consult with Dr. Mayer Camel for possible revision  Consult with [facility] for chronic pain management   Meds ordered this encounter  Medications  . HYDROcodone-acetaminophen (NORCO) 10-325 MG tablet    Sig: Take 1 tablet by mouth every 4 (four) hours as needed.    Dispense:  42 tablet    Refill:  0

## 2020-10-16 NOTE — Patient Instructions (Addendum)
Continue lyrica, norco, ibuprofen,   Consults will be requested for pain management and knee revision specialist

## 2020-10-16 NOTE — Addendum Note (Signed)
Addended by: Dessie Coma on: 10/16/2020 03:02 PM   Modules accepted: Orders

## 2020-10-19 DIAGNOSIS — Z79899 Other long term (current) drug therapy: Secondary | ICD-10-CM | POA: Diagnosis not present

## 2020-10-19 DIAGNOSIS — Z1159 Encounter for screening for other viral diseases: Secondary | ICD-10-CM | POA: Diagnosis not present

## 2020-10-19 DIAGNOSIS — G894 Chronic pain syndrome: Secondary | ICD-10-CM | POA: Diagnosis not present

## 2020-10-19 DIAGNOSIS — M129 Arthropathy, unspecified: Secondary | ICD-10-CM | POA: Diagnosis not present

## 2020-10-19 DIAGNOSIS — M25569 Pain in unspecified knee: Secondary | ICD-10-CM | POA: Diagnosis not present

## 2020-10-19 DIAGNOSIS — G8929 Other chronic pain: Secondary | ICD-10-CM | POA: Diagnosis not present

## 2020-10-19 DIAGNOSIS — E559 Vitamin D deficiency, unspecified: Secondary | ICD-10-CM | POA: Diagnosis not present

## 2020-10-19 DIAGNOSIS — Z96659 Presence of unspecified artificial knee joint: Secondary | ICD-10-CM | POA: Diagnosis not present

## 2020-10-20 DIAGNOSIS — M25561 Pain in right knee: Secondary | ICD-10-CM | POA: Diagnosis not present

## 2020-10-20 DIAGNOSIS — Z96651 Presence of right artificial knee joint: Secondary | ICD-10-CM | POA: Diagnosis not present

## 2020-10-20 DIAGNOSIS — M24661 Ankylosis, right knee: Secondary | ICD-10-CM | POA: Diagnosis not present

## 2020-11-01 ENCOUNTER — Ambulatory Visit: Payer: Medicaid Other | Admitting: Orthopedic Surgery

## 2020-11-27 ENCOUNTER — Other Ambulatory Visit: Payer: Self-pay

## 2020-11-27 ENCOUNTER — Ambulatory Visit: Payer: Medicaid Other | Admitting: Orthopedic Surgery

## 2020-11-27 VITALS — Ht 71.0 in | Wt 232.0 lb

## 2020-11-27 DIAGNOSIS — G8929 Other chronic pain: Secondary | ICD-10-CM | POA: Diagnosis not present

## 2020-11-27 DIAGNOSIS — M24661 Ankylosis, right knee: Secondary | ICD-10-CM

## 2020-11-27 DIAGNOSIS — M25561 Pain in right knee: Secondary | ICD-10-CM

## 2020-11-27 DIAGNOSIS — Z96651 Presence of right artificial knee joint: Secondary | ICD-10-CM | POA: Diagnosis not present

## 2020-11-27 DIAGNOSIS — Z9889 Other specified postprocedural states: Secondary | ICD-10-CM | POA: Diagnosis not present

## 2020-11-27 NOTE — Progress Notes (Signed)
Chief Complaint  Patient presents with  . Knee Pain    Recheck on right knee, DOS 08-08-20.    Encounter Diagnoses  Name Primary?  . S/P revision of total knee, right December 2021 Yes  . S/P total knee replacement, right August 2021   . S/P right knee arthroscopy 02/05/18   . S/P ACL repair right 05/19/18   . S/P hardware removal right knee 09/21/19   . 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 Fibrosis of right knee joint     45 year old male status post right total knee August 3 of 2021, after multiple knee surgeries including ACL reconstruction, then had tibial revision surgery on August 08, 2020  Postop developed recurrent arthrofibrosis  Labs rule out infection  We did have him see revision specialist who advised not to proceed with any other surgeries at least for a year  He was sent for pain management is now on oxycodone 7.5 Lyrica 75 and Robaxin with improved pain control  Exam shows a knee which is essentially locked up again with only about 20 degrees of motion  30 to 50 degrees  Recommend he continue with pain management  X-rays in August  Reassess knee in December

## 2021-01-10 DIAGNOSIS — Z79899 Other long term (current) drug therapy: Secondary | ICD-10-CM | POA: Diagnosis not present

## 2021-02-12 DIAGNOSIS — Z96659 Presence of unspecified artificial knee joint: Secondary | ICD-10-CM | POA: Diagnosis not present

## 2021-02-12 DIAGNOSIS — M25569 Pain in unspecified knee: Secondary | ICD-10-CM | POA: Diagnosis not present

## 2021-02-12 DIAGNOSIS — G8929 Other chronic pain: Secondary | ICD-10-CM | POA: Diagnosis not present

## 2021-02-12 DIAGNOSIS — Z79899 Other long term (current) drug therapy: Secondary | ICD-10-CM | POA: Diagnosis not present

## 2021-03-07 ENCOUNTER — Encounter: Payer: Self-pay | Admitting: Orthopedic Surgery

## 2021-03-07 ENCOUNTER — Other Ambulatory Visit: Payer: Self-pay

## 2021-03-07 ENCOUNTER — Ambulatory Visit: Payer: Medicaid Other | Admitting: Orthopedic Surgery

## 2021-03-07 ENCOUNTER — Ambulatory Visit: Payer: Medicaid Other

## 2021-03-07 VITALS — BP 126/85 | HR 88 | Ht 71.0 in | Wt 246.0 lb

## 2021-03-07 DIAGNOSIS — Z96651 Presence of right artificial knee joint: Secondary | ICD-10-CM

## 2021-03-07 DIAGNOSIS — W19XXXA Unspecified fall, initial encounter: Secondary | ICD-10-CM | POA: Diagnosis not present

## 2021-03-07 DIAGNOSIS — M25561 Pain in right knee: Secondary | ICD-10-CM

## 2021-03-07 NOTE — Progress Notes (Signed)
Established patient new problem   Chief Complaint  Patient presents with   Knee Injury    Pt fell on 03/03/21 and landed on Rt knee, pt is also s/p TK revision 08/08/20.    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  BP 126/85   Pulse 88   Ht 5\' 11"  (1.803 m)   Wt 246 lb (111.6 kg)   BMI 34.31 kg/m   Past Medical History:  Diagnosis Date   GERD (gastroesophageal reflux disease)    History of stomach ulcers    Hypertension     Physical Exam  Constitutional: Development normal, nutrition normal, body habitus normal BMI 34 Mental status oriented x3 mood and affect no depression Cardiovascular pulses and temperature normal   Gait crutch supported does not walk with heel toe gait walks on his toes on the right  Musculoskeletal:  Inspection: Incision no issues there knee is swollen and tender with an abrasion anterolateral tibia and medial lateral patella Range of motion: Flexion contracture persists at about 30 degrees and his flexion is only to 70 degrees Stability: Anterior drawer test posterior drawer test normal Muscle strength and tone: Increased muscle tone in his hamstrings  Skin warm dry and intact no erythema  Neurological sensation normal  Assessment and plan:  Abrasion right knee contusion right knee x-ray was negative  Ice, rest, active range of motion, I sent a note to Jeanella Anton to increase his medication to 10 mg for 7 days  Follow-up with me 2 weeks

## 2021-03-07 NOTE — Patient Instructions (Signed)
Continue ice   Rest   Gentle range of motion   Ibuprofen   I ll ask Janelle to increase the oxycodone for 7 days

## 2021-03-12 DIAGNOSIS — M25569 Pain in unspecified knee: Secondary | ICD-10-CM | POA: Diagnosis not present

## 2021-03-12 DIAGNOSIS — R030 Elevated blood-pressure reading, without diagnosis of hypertension: Secondary | ICD-10-CM | POA: Diagnosis not present

## 2021-03-12 DIAGNOSIS — G8929 Other chronic pain: Secondary | ICD-10-CM | POA: Diagnosis not present

## 2021-03-12 DIAGNOSIS — Z79899 Other long term (current) drug therapy: Secondary | ICD-10-CM | POA: Diagnosis not present

## 2021-03-12 DIAGNOSIS — G894 Chronic pain syndrome: Secondary | ICD-10-CM | POA: Diagnosis not present

## 2021-03-12 DIAGNOSIS — Z6834 Body mass index (BMI) 34.0-34.9, adult: Secondary | ICD-10-CM | POA: Diagnosis not present

## 2021-03-22 ENCOUNTER — Ambulatory Visit: Payer: Medicaid Other | Admitting: Orthopedic Surgery

## 2021-03-29 ENCOUNTER — Ambulatory Visit: Payer: Medicaid Other

## 2021-03-29 ENCOUNTER — Other Ambulatory Visit: Payer: Self-pay

## 2021-03-29 ENCOUNTER — Encounter: Payer: Self-pay | Admitting: Orthopedic Surgery

## 2021-03-29 ENCOUNTER — Ambulatory Visit (INDEPENDENT_AMBULATORY_CARE_PROVIDER_SITE_OTHER): Payer: Medicaid Other | Admitting: Orthopedic Surgery

## 2021-03-29 VITALS — BP 139/85 | HR 82 | Ht 71.0 in | Wt 249.2 lb

## 2021-03-29 DIAGNOSIS — Z96651 Presence of right artificial knee joint: Secondary | ICD-10-CM | POA: Diagnosis not present

## 2021-03-29 DIAGNOSIS — Z9889 Other specified postprocedural states: Secondary | ICD-10-CM | POA: Diagnosis not present

## 2021-03-29 DIAGNOSIS — M25561 Pain in right knee: Secondary | ICD-10-CM | POA: Diagnosis not present

## 2021-03-29 DIAGNOSIS — G8929 Other chronic pain: Secondary | ICD-10-CM

## 2021-03-29 NOTE — Progress Notes (Signed)
FOLLOW UP   Encounter Diagnoses  Name Primary?   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    S/P right knee arthroscopy 02/05/18    S/P ACL repair right 05/19/18    S/P hardware removal right knee 09/21/19    Chronic knee pain after total replacement of right knee joint      Chief Complaint  Patient presents with   Knee Pain    Right/follow up/pt states pain is about the same     Victor [name] fell about 3 weeks ago still having pain no real change in his condition he still complaining of locking of the knee he says it feels like something Ruzich have broken off for his slipping and side  He did contact pain management and they gave him 10 mg of oxycodone for about a week is back on a 7.5 mg still struggling with ambulation and pain  His range of motion is again 30 to 70 degrees not much change there is pain in the front of the knee with tenderness which tracks down into the proximal shin  We repeated his x-ray compared it to the x-ray last time there is been no change there is nothing that appears to be loose fractured or shows any signs of polyethylene debris or wear  The patient was seen by Dr. Mayer Camel who advised that he not have any further surgeries  At this point I am at a loss as to what is going on in his knee he is a high risk patient for repeat surgery and short of a full on femoral revision on not sure what else could help him and even with that he still be at risk for difficulty with therapy pain management etc.  I still think his pain is somewhat related to his ongoing lumbar disc disease which is inhibiting his ability to participate in rehab leading to his contractures  I told him I will call [place] to see if anyone would at least look at him and advise  Chronic problem with exacerbation moderate risks for further treatment if not high risk for further treatment which  would include a revision

## 2021-04-04 DIAGNOSIS — G8929 Other chronic pain: Secondary | ICD-10-CM | POA: Diagnosis not present

## 2021-04-04 DIAGNOSIS — G894 Chronic pain syndrome: Secondary | ICD-10-CM | POA: Diagnosis not present

## 2021-04-04 DIAGNOSIS — M25569 Pain in unspecified knee: Secondary | ICD-10-CM | POA: Diagnosis not present

## 2021-04-04 DIAGNOSIS — Z79899 Other long term (current) drug therapy: Secondary | ICD-10-CM | POA: Diagnosis not present

## 2021-04-04 DIAGNOSIS — R030 Elevated blood-pressure reading, without diagnosis of hypertension: Secondary | ICD-10-CM | POA: Diagnosis not present

## 2021-04-09 DIAGNOSIS — Z79899 Other long term (current) drug therapy: Secondary | ICD-10-CM | POA: Diagnosis not present

## 2021-04-13 ENCOUNTER — Other Ambulatory Visit: Payer: Self-pay | Admitting: Family Medicine

## 2021-05-04 DIAGNOSIS — Z6835 Body mass index (BMI) 35.0-35.9, adult: Secondary | ICD-10-CM | POA: Diagnosis not present

## 2021-05-04 DIAGNOSIS — G894 Chronic pain syndrome: Secondary | ICD-10-CM | POA: Diagnosis not present

## 2021-05-04 DIAGNOSIS — R030 Elevated blood-pressure reading, without diagnosis of hypertension: Secondary | ICD-10-CM | POA: Diagnosis not present

## 2021-05-04 DIAGNOSIS — M25569 Pain in unspecified knee: Secondary | ICD-10-CM | POA: Diagnosis not present

## 2021-05-04 DIAGNOSIS — G8929 Other chronic pain: Secondary | ICD-10-CM | POA: Diagnosis not present

## 2021-06-04 DIAGNOSIS — E559 Vitamin D deficiency, unspecified: Secondary | ICD-10-CM | POA: Diagnosis not present

## 2021-06-04 DIAGNOSIS — Z6835 Body mass index (BMI) 35.0-35.9, adult: Secondary | ICD-10-CM | POA: Diagnosis not present

## 2021-06-04 DIAGNOSIS — M25569 Pain in unspecified knee: Secondary | ICD-10-CM | POA: Diagnosis not present

## 2021-06-04 DIAGNOSIS — R030 Elevated blood-pressure reading, without diagnosis of hypertension: Secondary | ICD-10-CM | POA: Diagnosis not present

## 2021-06-04 DIAGNOSIS — Z79899 Other long term (current) drug therapy: Secondary | ICD-10-CM | POA: Diagnosis not present

## 2021-06-04 DIAGNOSIS — G8929 Other chronic pain: Secondary | ICD-10-CM | POA: Diagnosis not present

## 2021-06-15 ENCOUNTER — Other Ambulatory Visit: Payer: Self-pay | Admitting: Family Medicine

## 2021-06-15 ENCOUNTER — Other Ambulatory Visit: Payer: Self-pay

## 2021-06-15 MED ORDER — AMLODIPINE BESYLATE 10 MG PO TABS: 10.0000 mg | ORAL_TABLET | Freq: Every day | ORAL | 0 refills | Status: DC

## 2021-06-15 NOTE — Telephone Encounter (Signed)
  Prescription Request  06/15/2021  Is this a "Controlled Substance" medicine? NO  Have you seen your PCP in the last 2 weeks? NO If YES, route message to pool  -  If NO, patient needs to be scheduled for appointment.  What is the name of the medication or equipment? Amlodipine 10 mg  Have you contacted your pharmacy to request a refill? YES  Which pharmacy would you like this sent to? [facility]   Patient notified that their request is being sent to the clinical staff for review and that they should receive a response within 2 business days.

## 2021-06-15 NOTE — Telephone Encounter (Signed)
[place]. NTBS 30 days given 04/13/21 Last OV 05/02/20

## 2021-07-02 ENCOUNTER — Encounter: Payer: Self-pay | Admitting: Family Medicine

## 2021-07-02 ENCOUNTER — Ambulatory Visit: Payer: Medicaid Other | Admitting: Family Medicine

## 2021-07-02 ENCOUNTER — Other Ambulatory Visit: Payer: Self-pay

## 2021-07-02 VITALS — BP 136/78 | HR 84 | Temp 97.6°F | Ht 71.0 in | Wt 251.0 lb

## 2021-07-02 DIAGNOSIS — Z96651 Presence of right artificial knee joint: Secondary | ICD-10-CM

## 2021-07-02 DIAGNOSIS — Z8249 Family history of ischemic heart disease and other diseases of the circulatory system: Secondary | ICD-10-CM | POA: Diagnosis not present

## 2021-07-02 DIAGNOSIS — I10 Essential (primary) hypertension: Secondary | ICD-10-CM | POA: Diagnosis not present

## 2021-07-02 DIAGNOSIS — E782 Mixed hyperlipidemia: Secondary | ICD-10-CM

## 2021-07-02 LAB — CMP14+EGFR
ALT: 16 IU/L (ref 0–44)
AST: 18 IU/L (ref 0–40)
Albumin/Globulin Ratio: 2 (ref 1.2–2.2)
Albumin: 4.5 g/dL (ref 4.0–5.0)
Alkaline Phosphatase: 88 IU/L (ref 44–121)
BUN/Creatinine Ratio: 10 (ref 9–20)
BUN: 11 mg/dL (ref 6–24)
Bilirubin Total: 0.3 mg/dL (ref 0.0–1.2)
CO2: 25 mmol/L (ref 20–29)
Calcium: 9.1 mg/dL (ref 8.7–10.2)
Chloride: 103 mmol/L (ref 96–106)
Creatinine, Ser: 1.07 mg/dL (ref 0.76–1.27)
Globulin, Total: 2.3 g/dL (ref 1.5–4.5)
Glucose: 83 mg/dL (ref 70–99)
Potassium: 4 mmol/L (ref 3.5–5.2)
Sodium: 140 mmol/L (ref 134–144)
Total Protein: 6.8 g/dL (ref 6.0–8.5)
eGFR: 87 mL/min/{1.73_m2} (ref >59)

## 2021-07-02 LAB — LIPID PANEL
Chol/HDL Ratio: 5.4 ratio — ABNORMAL HIGH (ref 0.0–5.0)
Cholesterol, Total: 209 mg/dL — ABNORMAL HIGH (ref 100–199)
HDL: 39 mg/dL — ABNORMAL LOW (ref >39)
LDL Chol Calc (NIH): 140 mg/dL — ABNORMAL HIGH (ref 0–99)
Triglycerides: 167 mg/dL — ABNORMAL HIGH (ref 0–149)
VLDL Cholesterol Cal: 30 mg/dL (ref 5–40)

## 2021-07-02 MED ORDER — AMLODIPINE BESYLATE 10 MG PO TABS: 10.0000 mg | ORAL_TABLET | Freq: Every day | ORAL | 3 refills | Status: DC

## 2021-07-02 NOTE — Progress Notes (Signed)
Subjective: CC: Hypertension PCP: [name], DO Victor [name] is a 45 y.o. male presenting to clinic today for:  1.  Hypertension with hyperlipidemia Patient is compliant with Norvasc 10 mg daily.  Not treated for hypercholesterolemia.  No chest pain, shortness of breath, headache, dizziness or swelling  2.  Chronic right knee pain Patient has had several knee surgeries with plan for total knee replacement in December.  He is asking for handicap placard today.  He is currently managed by pain management with Percocet, Lyrica and Robaxin.  He apparently is now on total disability from this issue   ROS: Per HPI  No Known Allergies Past Medical History:  Diagnosis Date   GERD (gastroesophageal reflux disease)    History of stomach ulcers    Hypertension     Current Outpatient Medications:    amitriptyline (ELAVIL) 10 MG tablet, Take 10 mg by mouth at bedtime., Disp: , Rfl:    amLODipine (NORVASC) 10 MG tablet, Take 1 tablet (10 mg total) by mouth daily. (NEEDS TO BE SEEN BEFORE NEXT REFILL), Disp: 30 tablet, Rfl: 0   aspirin EC 325 MG EC tablet, Take 1 tablet (325 mg total) by mouth daily with breakfast., Disp: 30 tablet, Rfl: 0   methocarbamol (ROBAXIN) 500 MG tablet, Take 1 tablet (500 mg total) by mouth every 6 (six) hours as needed for muscle spasms., Disp: 30 tablet, Rfl: 0   naloxone (NARCAN) nasal spray 4 mg/0.1 mL, , Disp: , Rfl:    oxyCODONE-acetaminophen (PERCOCET) 7.5-325 MG tablet, Take 1 tablet by mouth every 6 (six) hours as needed for severe pain., Disp: , Rfl:    pregabalin (LYRICA) 75 MG capsule, Take 75 mg by mouth 3 (three) times daily., Disp: , Rfl:  Social History   Socioeconomic History   Marital status: Married    Spouse name: Not on file   Number of children: 3   Years of education: Not on file   Highest education level: Not on file  Occupational History   Not on file  Tobacco Use   Smoking status: Every Day    Packs/day: 0.50    Years:  15.00    Pack years: 7.50    Types: Cigarettes    Start date: 07/07/1991    Last attempt to quit: 04/09/2018    Years since quitting: 3.2   Smokeless tobacco: Former    Types: Chew    Quit date: 06/09/2018  Vaping Use   Vaping Use: Never used  Substance and Sexual Activity   Alcohol use: No   Drug use: No   Sexual activity: Yes  Other Topics Concern   Not on file  Social History Narrative   Not on file   Social Determinants of Health   Financial Resource Strain: Not on file  Food Insecurity: Not on file  Transportation Needs: Not on file  Physical Activity: Not on file  Stress: Not on file  Social Connections: Not on file  Intimate Partner Violence: Not on file   Family History  Problem Relation Age of Onset   Diabetes Mother    Heart failure Mother    Cancer Mother    Pancreatic cancer Mother    Cancer Father    Throat cancer Father    Healthy Sister    Head & neck cancer Brother    Bone cancer Maternal Grandmother    Diabetes Maternal Grandmother    CAD Maternal Grandfather    Brain cancer Maternal Grandfather  Colon cancer Paternal Grandfather     Objective: Office vital signs reviewed. BP 136/78   Pulse 84   Temp 97.6 F (36.4 C) (Temporal)   Ht '5\' 11"'  (1.803 m)   Wt 251 lb (113.9 kg)   BMI 35.01 kg/m   Physical Examination:  General: Awake, alert, well nourished, No acute distress HEENT: Normal; sclera white.  Moist mucous membranes.  No carotid bruits Cardio: regular rate and rhythm, S1S2 heard, no murmurs appreciated Pulm: clear to auscultation bilaterally, no wheezes, rhonchi or rales; normal work of breathing on room air GI: soft, non-tender, non-distended, bowel sounds present x4, no hepatomegaly, no splenomegaly, no masses Extremities: warm, well perfused, No edema, cyanosis or clubbing; +2 pulses bilaterally MSK: antalgic gait and station; unable to flex right knee without pain  Assessment/ Plan: 45 y.o. 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  No changes.  Check CMP and nonfasting lipid.  Norvasc renewed and sent to pharmacy for 1 year  Not currently treated for cholesterol.  Has history of early CAD.  Has history of total knee revision surgery with plans to repeat in December.  I gave him a handicap placard today.  He will let me know if this needs to be made into a permanent placard pending his results of this upcoming knee surgery  No orders of the defined types were placed in this encounter.  No orders of the defined types were placed in this encounter.    [name], DO [place] [phone]

## 2021-07-05 DIAGNOSIS — Z6834 Body mass index (BMI) 34.0-34.9, adult: Secondary | ICD-10-CM | POA: Diagnosis not present

## 2021-07-05 DIAGNOSIS — G894 Chronic pain syndrome: Secondary | ICD-10-CM | POA: Diagnosis not present

## 2021-07-05 DIAGNOSIS — G8929 Other chronic pain: Secondary | ICD-10-CM | POA: Diagnosis not present

## 2021-07-05 DIAGNOSIS — M25569 Pain in unspecified knee: Secondary | ICD-10-CM | POA: Diagnosis not present

## 2021-07-05 DIAGNOSIS — Z79899 Other long term (current) drug therapy: Secondary | ICD-10-CM | POA: Diagnosis not present

## 2021-07-05 DIAGNOSIS — E559 Vitamin D deficiency, unspecified: Secondary | ICD-10-CM | POA: Diagnosis not present

## 2021-07-05 DIAGNOSIS — R030 Elevated blood-pressure reading, without diagnosis of hypertension: Secondary | ICD-10-CM | POA: Diagnosis not present

## 2021-07-09 DIAGNOSIS — Z79899 Other long term (current) drug therapy: Secondary | ICD-10-CM | POA: Diagnosis not present

## 2021-07-11 ENCOUNTER — Inpatient Hospital Stay (HOSPITAL_COMMUNITY)
Admission: EM | Admit: 2021-07-11 | Discharge: 2021-07-28 | DRG: 853 | Disposition: A | Discharge status: Home / Self Care | Payer: Medicaid Other | Source: Ambulatory Visit | Attending: Internal Medicine | Admitting: Internal Medicine

## 2021-07-11 ENCOUNTER — Encounter (HOSPITAL_COMMUNITY): Payer: Self-pay | Admitting: Emergency Medicine

## 2021-07-11 ENCOUNTER — Emergency Department (HOSPITAL_COMMUNITY): Payer: Medicaid Other

## 2021-07-11 DIAGNOSIS — F17210 Nicotine dependence, cigarettes, uncomplicated: Secondary | ICD-10-CM | POA: Diagnosis present

## 2021-07-11 DIAGNOSIS — R0602 Shortness of breath: Secondary | ICD-10-CM | POA: Diagnosis not present

## 2021-07-11 DIAGNOSIS — R0600 Dyspnea, unspecified: Secondary | ICD-10-CM

## 2021-07-11 DIAGNOSIS — R9431 Abnormal electrocardiogram [ECG] [EKG]: Secondary | ICD-10-CM | POA: Diagnosis not present

## 2021-07-11 DIAGNOSIS — Z800 Family history of malignant neoplasm of digestive organs: Secondary | ICD-10-CM

## 2021-07-11 DIAGNOSIS — Z20822 Contact with and (suspected) exposure to covid-19: Secondary | ICD-10-CM | POA: Diagnosis present

## 2021-07-11 DIAGNOSIS — Z9689 Presence of other specified functional implants: Secondary | ICD-10-CM

## 2021-07-11 DIAGNOSIS — Z6834 Body mass index (BMI) 34.0-34.9, adult: Secondary | ICD-10-CM

## 2021-07-11 DIAGNOSIS — K760 Fatty (change of) liver, not elsewhere classified: Secondary | ICD-10-CM | POA: Diagnosis present

## 2021-07-11 DIAGNOSIS — Z7982 Long term (current) use of aspirin: Secondary | ICD-10-CM

## 2021-07-11 DIAGNOSIS — Z938 Other artificial opening status: Secondary | ICD-10-CM

## 2021-07-11 DIAGNOSIS — I2510 Atherosclerotic heart disease of native coronary artery without angina pectoris: Secondary | ICD-10-CM | POA: Diagnosis present

## 2021-07-11 DIAGNOSIS — E785 Hyperlipidemia, unspecified: Secondary | ICD-10-CM | POA: Diagnosis present

## 2021-07-11 DIAGNOSIS — R0689 Other abnormalities of breathing: Secondary | ICD-10-CM | POA: Diagnosis not present

## 2021-07-11 DIAGNOSIS — Z96651 Presence of right artificial knee joint: Secondary | ICD-10-CM | POA: Diagnosis present

## 2021-07-11 DIAGNOSIS — Z833 Family history of diabetes mellitus: Secondary | ICD-10-CM

## 2021-07-11 DIAGNOSIS — J181 Lobar pneumonia, unspecified organism: Secondary | ICD-10-CM

## 2021-07-11 DIAGNOSIS — Z8711 Personal history of peptic ulcer disease: Secondary | ICD-10-CM

## 2021-07-11 DIAGNOSIS — J440 Chronic obstructive pulmonary disease with acute lower respiratory infection: Secondary | ICD-10-CM | POA: Diagnosis present

## 2021-07-11 DIAGNOSIS — J159 Unspecified bacterial pneumonia: Secondary | ICD-10-CM | POA: Diagnosis present

## 2021-07-11 DIAGNOSIS — R739 Hyperglycemia, unspecified: Secondary | ICD-10-CM | POA: Diagnosis present

## 2021-07-11 DIAGNOSIS — M25561 Pain in right knee: Secondary | ICD-10-CM | POA: Diagnosis present

## 2021-07-11 DIAGNOSIS — J189 Pneumonia, unspecified organism: Secondary | ICD-10-CM | POA: Diagnosis not present

## 2021-07-11 DIAGNOSIS — T380X5A Adverse effect of glucocorticoids and synthetic analogues, initial encounter: Secondary | ICD-10-CM | POA: Diagnosis present

## 2021-07-11 DIAGNOSIS — R000 Tachycardia, unspecified: Secondary | ICD-10-CM | POA: Diagnosis not present

## 2021-07-11 DIAGNOSIS — J869 Pyothorax without fistula: Secondary | ICD-10-CM | POA: Diagnosis present

## 2021-07-11 DIAGNOSIS — J40 Bronchitis, not specified as acute or chronic: Secondary | ICD-10-CM | POA: Diagnosis not present

## 2021-07-11 DIAGNOSIS — E876 Hypokalemia: Secondary | ICD-10-CM | POA: Diagnosis present

## 2021-07-11 DIAGNOSIS — E669 Obesity, unspecified: Secondary | ICD-10-CM | POA: Diagnosis present

## 2021-07-11 DIAGNOSIS — I10 Essential (primary) hypertension: Secondary | ICD-10-CM | POA: Diagnosis present

## 2021-07-11 DIAGNOSIS — R7989 Other specified abnormal findings of blood chemistry: Secondary | ICD-10-CM

## 2021-07-11 DIAGNOSIS — D649 Anemia, unspecified: Secondary | ICD-10-CM | POA: Diagnosis present

## 2021-07-11 DIAGNOSIS — R0781 Pleurodynia: Secondary | ICD-10-CM

## 2021-07-11 DIAGNOSIS — E222 Syndrome of inappropriate secretion of antidiuretic hormone: Secondary | ICD-10-CM | POA: Diagnosis present

## 2021-07-11 DIAGNOSIS — A419 Sepsis, unspecified organism: Principal | ICD-10-CM | POA: Diagnosis present

## 2021-07-11 DIAGNOSIS — Z8249 Family history of ischemic heart disease and other diseases of the circulatory system: Secondary | ICD-10-CM

## 2021-07-11 DIAGNOSIS — R0902 Hypoxemia: Secondary | ICD-10-CM | POA: Diagnosis not present

## 2021-07-11 DIAGNOSIS — Z9889 Other specified postprocedural states: Secondary | ICD-10-CM

## 2021-07-11 DIAGNOSIS — Z7952 Long term (current) use of systemic steroids: Secondary | ICD-10-CM

## 2021-07-11 DIAGNOSIS — G8929 Other chronic pain: Secondary | ICD-10-CM | POA: Diagnosis present

## 2021-07-11 DIAGNOSIS — R109 Unspecified abdominal pain: Secondary | ICD-10-CM | POA: Diagnosis present

## 2021-07-11 DIAGNOSIS — Z4682 Encounter for fitting and adjustment of non-vascular catheter: Secondary | ICD-10-CM

## 2021-07-11 DIAGNOSIS — J90 Pleural effusion, not elsewhere classified: Secondary | ICD-10-CM

## 2021-07-11 DIAGNOSIS — R457 State of emotional shock and stress, unspecified: Secondary | ICD-10-CM | POA: Diagnosis not present

## 2021-07-11 DIAGNOSIS — D75838 Other thrombocytosis: Secondary | ICD-10-CM | POA: Diagnosis present

## 2021-07-11 DIAGNOSIS — R110 Nausea: Secondary | ICD-10-CM | POA: Diagnosis not present

## 2021-07-11 DIAGNOSIS — Z720 Tobacco use: Secondary | ICD-10-CM | POA: Diagnosis present

## 2021-07-11 DIAGNOSIS — Z808 Family history of malignant neoplasm of other organs or systems: Secondary | ICD-10-CM

## 2021-07-11 DIAGNOSIS — J9601 Acute respiratory failure with hypoxia: Secondary | ICD-10-CM | POA: Diagnosis present

## 2021-07-11 LAB — COMPREHENSIVE METABOLIC PANEL
ALT: 23 U/L (ref 0–44)
AST: 22 U/L (ref 15–41)
Albumin: 3.8 g/dL (ref 3.5–5.0)
Alkaline Phosphatase: 101 U/L (ref 38–126)
Anion gap: 12 (ref 5–15)
BUN: 14 mg/dL (ref 6–20)
CO2: 26 mmol/L (ref 22–32)
Calcium: 8.8 mg/dL — ABNORMAL LOW (ref 8.9–10.3)
Chloride: 97 mmol/L — ABNORMAL LOW (ref 98–111)
Creatinine, Ser: 0.9 mg/dL (ref 0.61–1.24)
GFR, Estimated: >60 mL/min (ref >60)
Glucose, Bld: 129 mg/dL — ABNORMAL HIGH (ref 70–99)
Potassium: 3.2 mmol/L — ABNORMAL LOW (ref 3.5–5.1)
Sodium: 135 mmol/L (ref 135–145)
Total Bilirubin: 0.6 mg/dL (ref 0.3–1.2)
Total Protein: 8.2 g/dL — ABNORMAL HIGH (ref 6.5–8.1)

## 2021-07-11 LAB — URINALYSIS, ROUTINE W REFLEX MICROSCOPIC
Bacteria, UA: NONE SEEN
Bilirubin Urine: NEGATIVE
Glucose, UA: NEGATIVE mg/dL
Ketones, ur: NEGATIVE mg/dL
Leukocytes,Ua: NEGATIVE
Nitrite: NEGATIVE
Protein, ur: 30 mg/dL — AB
Specific Gravity, Urine: 1.021 (ref 1.005–1.030)
pH: 5 (ref 5.0–8.0)

## 2021-07-11 LAB — CBC WITH DIFFERENTIAL/PLATELET
Abs Immature Granulocytes: 0.07 10*3/uL (ref 0.00–0.07)
Basophils Absolute: 0.1 10*3/uL (ref 0.0–0.1)
Basophils Relative: 0 %
Eosinophils Absolute: 0.1 10*3/uL (ref 0.0–0.5)
Eosinophils Relative: 0 %
HCT: 43.3 % (ref 39.0–52.0)
Hemoglobin: 14.6 g/dL (ref 13.0–17.0)
Immature Granulocytes: 0 %
Lymphocytes Relative: 7 %
Lymphs Abs: 1.1 10*3/uL (ref 0.7–4.0)
MCH: 30 pg (ref 26.0–34.0)
MCHC: 33.7 g/dL (ref 30.0–36.0)
MCV: 88.9 fL (ref 80.0–100.0)
Monocytes Absolute: 1 10*3/uL (ref 0.1–1.0)
Monocytes Relative: 7 %
Neutro Abs: 13.5 10*3/uL — ABNORMAL HIGH (ref 1.7–7.7)
Neutrophils Relative %: 86 %
Platelets: 388 10*3/uL (ref 150–400)
RBC: 4.87 MIL/uL (ref 4.22–5.81)
RDW: 13.3 % (ref 11.5–15.5)
WBC: 15.8 10*3/uL — ABNORMAL HIGH (ref 4.0–10.5)
nRBC: 0 % (ref 0.0–0.2)

## 2021-07-11 LAB — RESP PANEL BY RT-PCR (FLU A&B, COVID) ARPGX2
Influenza A by PCR: NEGATIVE
Influenza B by PCR: NEGATIVE
SARS Coronavirus 2 by RT PCR: NEGATIVE

## 2021-07-11 LAB — LACTIC ACID, PLASMA
Lactic Acid, Venous: 1.6 mmol/L (ref 0.5–1.9)
Lactic Acid, Venous: 1.1 mmol/L (ref 0.5–1.9)

## 2021-07-11 LAB — PROTIME-INR
INR: 1 (ref 0.8–1.2)
Prothrombin Time: 13.6 seconds (ref 11.4–15.2)

## 2021-07-11 MED ORDER — HYDROMORPHONE HCL 1 MG/ML IJ SOLN
0.5000 mg | Freq: Once | INTRAMUSCULAR | Status: AC
  Administered 2021-07-11: 0.5 mg via INTRAVENOUS
  Filled 2021-07-11: qty 1

## 2021-07-11 MED ORDER — HYDROMORPHONE HCL 1 MG/ML IJ SOLN
1.0000 mg | Freq: Once | INTRAMUSCULAR | Status: AC
  Administered 2021-07-11: 1 mg via INTRAVENOUS
  Filled 2021-07-11: qty 1

## 2021-07-11 MED ORDER — SODIUM CHLORIDE 0.9 % IV SOLN
1.0000 g | Freq: Once | INTRAVENOUS | Status: AC
  Administered 2021-07-11: 1 g via INTRAVENOUS
  Filled 2021-07-11: qty 10

## 2021-07-11 MED ORDER — IOHEXOL 350 MG/ML SOLN
100.0000 mL | Freq: Once | INTRAVENOUS | Status: AC | PRN
  Administered 2021-07-11: 100 mL via INTRAVENOUS

## 2021-07-11 MED ORDER — SODIUM CHLORIDE 0.9 % IV BOLUS
1000.0000 mL | Freq: Once | INTRAVENOUS | Status: AC
  Administered 2021-07-11: 1000 mL via INTRAVENOUS

## 2021-07-11 MED ORDER — SODIUM CHLORIDE 0.9 % IV SOLN
500.0000 mg | Freq: Once | INTRAVENOUS | Status: AC
  Administered 2021-07-11: 500 mg via INTRAVENOUS
  Filled 2021-07-11: qty 500

## 2021-07-11 NOTE — ED Triage Notes (Signed)
Pt c/o SOB for the past 2 days. Pt seen by PCP this morning dx with bronchitis and pneumonia, but states no CXR was completed. Pt was 86% on RA for EMS. Pt arrives on 5L [place] O2 sats 95%.

## 2021-07-11 NOTE — ED Provider Notes (Signed)
[facility] EMERGENCY DEPARTMENT Provider Note   CSN: [id number] Arrival date & time: 07/11/21  2053     History Chief Complaint  Patient presents with   Shortness of Breath    Victor [name] Floor is a 45 y.o. male.   Shortness of Breath Associated symptoms: abdominal pain and chest pain   Associated symptoms: no rash   Patient presents with chest pain and shortness of breath.  Has had symptoms for close to a week now.  Urgent care today diagnosed with bronchitis and gave antibiotics and steroids however had not been able to get these filled yet.  Worsening pain.  Presents with hypoxia.  Sats of 86% on room air for EMS.  States sharp pain on left chest.  Worse with breathing.  Has had a cough.  Also hurts in the abdomen somewhat.  Worse with movements.    Past Medical History:  Diagnosis Date   GERD (gastroesophageal reflux disease)    History of stomach ulcers    Hypertension     Patient Active Problem List   Diagnosis Date Noted   S/P revision of total knee, right 08/08/20 08/08/2020   Arthrofibrosis of knee joint, right    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   S/P hardware removal right knee 09/21/19 10/04/2019   Essential hypertension 04/07/2019   Family history of early CAD 04/07/2019   History of chest pain 03/10/2019   Hyperlipidemia 03/10/2019   Tobacco abuse 03/10/2019   S/P right knee arthroscopy 02/05/18 06/23/2017   S/P ACL repair right 05/19/18    Chondromalacia of lateral femoral condyle, right    Chondromalacia, patella, right     Past Surgical History:  Procedure Laterality Date   ANTERIOR CRUCIATE LIGAMENT REPAIR Right 05/19/2018   Procedure: RIGHT KNEE ARTHROSCOPY WITH ANTERIOR CRUCIATE LIGAMENT (ACL) REPAIR and MEDIAL MENISECTOMY;  Surgeon: [name], MD;  Location: [facility];  Service: Orthopedics;  Laterality: Right;   APPENDECTOMY     HARDWARE REMOVAL Right 09/21/2019   Procedure: HARDWARE REMOVAL (RICHARD'S STAPLE) RIGHT KNEE;  Surgeon:  [name], MD;  Location: [facility];  Service: Orthopedics;  Laterality: Right;   KNEE ARTHROSCOPY WITH MEDIAL MENISECTOMY Right 05/29/2017   Procedure: KNEE ARTHROSCOPY WITH MEDIAL MENISECTOMY and lateral meniscectomy;  Surgeon: [name], MD;  Location: [facility];  Service: Orthopedics;  Laterality: Right;   KNEE ARTHROSCOPY WITH MEDIAL MENISECTOMY Right 02/05/2018   Procedure: RIGHT KNEE ARTHROSCOPY WITH PARTIAL MEDIAL AND LATERAL MENISECTOMY;  Surgeon: [name], MD;  Location: [facility];  Service: Orthopedics;  Laterality: Right;   TOTAL KNEE ARTHROPLASTY Right 03/28/2020   Procedure: TOTAL KNEE ARTHROPLASTY;  Surgeon: [name], MD;  Location: [facility];  Service: Orthopedics;  Laterality: Right;   TOTAL KNEE REVISION Right 08/08/2020   Procedure: REVISION OF RIGHT TOTAL KNEE  TIBIAL COMPONENTS ONLY;  Surgeon: [name], MD;  Location: [facility];  Service: Orthopedics;  Laterality: Right;       Family History  Problem Relation Age of Onset   Diabetes Mother    Heart failure Mother    Cancer Mother    Pancreatic cancer Mother    Cancer Father    Throat cancer Father    Healthy Sister    Head & neck cancer Brother    Bone cancer Maternal Grandmother    Diabetes Maternal Grandmother    CAD Maternal Grandfather    Brain cancer Maternal Grandfather    Colon cancer Paternal Grandfather  Social History   Tobacco Use   Smoking status: Every Day    Packs/day: 0.50    Years: 15.00    Pack years: 7.50    Types: Cigarettes    Start date: 07/07/1991    Last attempt to quit: 04/09/2018    Years since quitting: 3.2   Smokeless tobacco: Former    Types: Chew    Quit date: 06/09/2018  Vaping Use   Vaping Use: Never used  Substance Use Topics   Alcohol use: No   Drug use: No    Home Medications Prior to Admission medications   Medication Sig Start Date End Date Taking? Authorizing Provider  aspirin 325 MG tablet Take 1 tablet by mouth daily  before breakfast. 08/12/20  Yes [provider]  promethazine-dextromethorphan (PROMETHAZINE-DM) 6.25-15 MG/5ML syrup Take by mouth. 07/11/21 07/18/21 Yes [provider]  amitriptyline (ELAVIL) 10 MG tablet Take 10 mg by mouth at bedtime. 10/20/20   [provider]  amLODipine (NORVASC) 10 MG tablet Take 1 tablet (10 mg total) by mouth daily. 07/02/21   [name], DO  aspirin EC 325 MG EC tablet Take 1 tablet (325 mg total) by mouth daily with breakfast. 08/12/20   [name], MD  doxycycline (VIBRA-TABS) 100 MG tablet Take 100 mg by mouth 2 (two) times daily. 07/11/21   [provider]  methocarbamol (ROBAXIN) 500 MG tablet Take 1 tablet (500 mg total) by mouth every 6 (six) hours as needed for muscle spasms. 10/13/20   [name], MD  naloxone [facility]) nasal spray 4 mg/0.1 mL  10/20/20   [provider]  oxyCODONE-acetaminophen (PERCOCET) 7.5-325 MG tablet Take 1 tablet by mouth every 6 (six) hours as needed for severe pain.    [provider]  predniSONE (DELTASONE) 20 MG tablet Take 20 mg by mouth 2 (two) times daily. 07/11/21   [provider]  pregabalin (LYRICA) 75 MG capsule Take 75 mg by mouth 3 (three) times daily. 02/12/21   [provider]  traZODone (DESYREL) 50 MG tablet Take 50 mg by mouth at bedtime.    [provider]    Allergies    Patient has no known allergies.  Review of Systems   Review of Systems  Constitutional:  Negative for appetite change.  HENT:  Negative for congestion.   Respiratory:  Positive for shortness of breath.   Cardiovascular:  Positive for chest pain. Negative for leg swelling.  Gastrointestinal:  Positive for abdominal pain.  Genitourinary:  Negative for flank pain.  Musculoskeletal:  Negative for gait problem.  Skin:  Negative for rash.  Neurological:  Negative for weakness.  Psychiatric/Behavioral:  Negative for confusion.    Physical  Exam Updated Vital Signs BP 130/82   Pulse (!) 118   Temp 98.3 F (36.8 C)   Resp (!) 23   Ht 5\' 11"  (1.803 m)   Wt 113.9 kg   SpO2 93%   BMI 35.01 kg/m   Physical Exam Vitals reviewed.  HENT:     Head: Normocephalic.  Cardiovascular:     Rate and Rhythm: Regular rhythm. Tachycardia present.  Pulmonary:     Comments: Mildly decreased breath sounds on left.  Tenderness to left chest wall. Chest:     Chest wall: Tenderness present.  Musculoskeletal:     Cervical back: Neck supple.     Right lower leg: No tenderness. No edema.     Left lower leg: No tenderness. No edema.  Skin:  General: Skin is warm.     Capillary Refill: Capillary refill takes less than 2 seconds.  Neurological:     Mental Status: He is alert and oriented to person, place, and time.    ED Results / Procedures / Treatments   Labs (all labs ordered are listed, but only abnormal results are displayed) Labs Reviewed  COMPREHENSIVE METABOLIC PANEL - Abnormal; Notable for the following components:      Result Value   Potassium 3.2 (*)    Chloride 97 (*)    Glucose, Bld 129 (*)    Calcium 8.8 (*)    Total Protein 8.2 (*)    All other components within normal limits  CBC WITH DIFFERENTIAL/PLATELET - Abnormal; Notable for the following components:   WBC 15.8 (*)    Neutro Abs 13.5 (*)    All other components within normal limits  URINALYSIS, ROUTINE W REFLEX MICROSCOPIC - Abnormal; Notable for the following components:   Hgb urine dipstick MODERATE (*)    Protein, ur 30 (*)    All other components within normal limits  CULTURE, BLOOD (ROUTINE X 2)  CULTURE, BLOOD (ROUTINE X 2)  RESP PANEL BY RT-PCR (FLU A&B, COVID) ARPGX2  LACTIC ACID, PLASMA  LACTIC ACID, PLASMA  PROTIME-INR    EKG EKG Interpretation  Date/Time:  Wednesday July 11 2021 20:59:49 EST Ventricular Rate:  132 PR Interval:  144 QRS Duration: 81 QT Interval:  270 QTC Calculation: 400 R Axis:   15 Text  Interpretation: Sinus tachycardia Confirmed by Davonna Belling [phone]) on 07/11/2021 9:36:18 PM  Radiology DG Chest 2 View  Result Date: 07/11/2021 CLINICAL DATA:  Shortness of breath and oxygen desaturation. EXAM: CHEST - 2 VIEW COMPARISON:  PA Lat 02/16/2019. FINDINGS: The cardiac size is normal. Central vessels are normal caliber. There is interval low inspiration and development of a moderate layering left pleural effusion with overlying atelectasis or consolidation of the left lower lung field. The hypoexpanded right lung is clear. No pneumothorax is seen. Thoracic cage is intact. IMPRESSION: New finding of moderate left pleural effusion with overlying atelectasis or consolidation in left lower lung field. Clinical correlation and follow-up recommended. Electronically Signed   By: Telford Nab M.D.   On: 07/11/2021 22:04   CT Angio Chest PE W and/or Wo Contrast  Result Date: 07/11/2021 CLINICAL DATA:  Shortness of breath x2 days. EXAM: CT ANGIOGRAPHY CHEST WITH CONTRAST TECHNIQUE: Multidetector CT imaging of the chest was performed using the standard protocol during bolus administration of intravenous contrast. Multiplanar CT image reconstructions and MIPs were obtained to evaluate the vascular anatomy. CONTRAST:  19mL OMNIPAQUE IOHEXOL 350 MG/ML SOLN COMPARISON:  None. FINDINGS: Cardiovascular: The subsegmental pulmonary arteries are limited in evaluation secondary to suboptimal opacification with intravenous contrast as well as areas of overlying artifact. No evidence of pulmonary embolism. Normal heart size with mild coronary artery calcification. No pericardial effusion. Mediastinum/Nodes: No enlarged mediastinal, hilar, or axillary lymph nodes. Thyroid gland, trachea, and esophagus demonstrate no significant findings. Lungs/Pleura: Moderate to marked severity atelectasis and/or infiltrate is seen within the left lower lobe and along the periphery of the left upper lobe. Mild posterior right  basilar atelectasis and/or infiltrate is also seen. A small to moderate size left-sided pleural effusion is seen. No pneumothorax is identified. Upper Abdomen: No acute abnormality. Musculoskeletal: No chest wall abnormality. No acute or significant osseous findings. Review of the MIP images confirms the above findings. IMPRESSION: 1. Moderate to marked severity left lower lobe and left upper  lobe atelectasis and/or infiltrate. 2. Mild posterior right basilar atelectasis and/or infiltrate. 3. Small to moderate size left-sided pleural effusion. 4. Limited evaluation of the pulmonary arteries, as described above, without definite evidence of pulmonary embolism. Electronically Signed   By: Virgina Norfolk M.D.   On: 07/11/2021 23:14    Procedures Procedures   Medications Ordered in ED Medications  HYDROmorphone (DILAUDID) injection 1 mg (has no administration in time range)  sodium chloride 0.9 % bolus 1,000 mL (has no administration in time range)  sodium chloride 0.9 % bolus 1,000 mL (0 mLs Intravenous Stopped 07/11/21 2341)  HYDROmorphone (DILAUDID) injection 0.5 mg (0.5 mg Intravenous Given 07/11/21 2155)  HYDROmorphone (DILAUDID) injection 1 mg (1 mg Intravenous Given 07/11/21 2236)  cefTRIAXone (ROCEPHIN) 1 g in sodium chloride 0.9 % 100 mL IVPB (0 g Intravenous Stopped 07/11/21 2341)  azithromycin (ZITHROMAX) 500 mg in sodium chloride 0.9 % 250 mL IVPB (0 mg Intravenous Stopped 07/11/21 2356)  iohexol (OMNIPAQUE) 350 MG/ML injection 100 mL (100 mLs Intravenous Contrast Given 07/11/21 2250)    ED Course  I have reviewed the triage vital signs and the nursing notes.  Pertinent labs & imaging results that were available during my care of the patient were reviewed by me and considered in my medical decision making (see chart for details).    MDM Rules/Calculators/A&P                           Patient with shortness of breath cough and left chest pain.  Hypoxic on room air.  Improved with  nasal cannula oxygen.  Also moderate tenderness.  Chest x-ray showed effusion.  CT scan done to evaluate for clot also.  No PE seen although somewhat limited by bolus timing.  However does have possible pneumonia at the left base and potentially right base.  Also moderate effusion.  With hypoxia will require admission to the hospital.  Will give antibiotics to cover pneumonia.  Will discuss with hospitalist.   Final Clinical Impression(s) / ED Diagnoses Final diagnoses:  Community acquired pneumonia, unspecified laterality  Hypoxia    Rx / DC Orders ED Discharge Orders     None        Davonna Belling, MD 07/12/21 0005

## 2021-07-12 ENCOUNTER — Other Ambulatory Visit: Payer: Self-pay

## 2021-07-12 ENCOUNTER — Other Ambulatory Visit (HOSPITAL_COMMUNITY): Payer: Medicaid Other

## 2021-07-12 ENCOUNTER — Encounter (HOSPITAL_COMMUNITY): Payer: Self-pay | Admitting: Internal Medicine

## 2021-07-12 ENCOUNTER — Inpatient Hospital Stay (HOSPITAL_COMMUNITY): Payer: Medicaid Other

## 2021-07-12 DIAGNOSIS — Z720 Tobacco use: Secondary | ICD-10-CM

## 2021-07-12 DIAGNOSIS — J90 Pleural effusion, not elsewhere classified: Secondary | ICD-10-CM

## 2021-07-12 DIAGNOSIS — Z6834 Body mass index (BMI) 34.0-34.9, adult: Secondary | ICD-10-CM | POA: Diagnosis not present

## 2021-07-12 DIAGNOSIS — T380X5A Adverse effect of glucocorticoids and synthetic analogues, initial encounter: Secondary | ICD-10-CM | POA: Diagnosis present

## 2021-07-12 DIAGNOSIS — R079 Chest pain, unspecified: Secondary | ICD-10-CM | POA: Diagnosis not present

## 2021-07-12 DIAGNOSIS — Z20822 Contact with and (suspected) exposure to covid-19: Secondary | ICD-10-CM | POA: Diagnosis not present

## 2021-07-12 DIAGNOSIS — E785 Hyperlipidemia, unspecified: Secondary | ICD-10-CM | POA: Diagnosis not present

## 2021-07-12 DIAGNOSIS — E876 Hypokalemia: Secondary | ICD-10-CM | POA: Diagnosis not present

## 2021-07-12 DIAGNOSIS — J159 Unspecified bacterial pneumonia: Secondary | ICD-10-CM | POA: Diagnosis not present

## 2021-07-12 DIAGNOSIS — J181 Lobar pneumonia, unspecified organism: Secondary | ICD-10-CM | POA: Diagnosis not present

## 2021-07-12 DIAGNOSIS — R109 Unspecified abdominal pain: Secondary | ICD-10-CM | POA: Diagnosis present

## 2021-07-12 DIAGNOSIS — K429 Umbilical hernia without obstruction or gangrene: Secondary | ICD-10-CM | POA: Diagnosis not present

## 2021-07-12 DIAGNOSIS — I700 Atherosclerosis of aorta: Secondary | ICD-10-CM | POA: Diagnosis not present

## 2021-07-12 DIAGNOSIS — Z8249 Family history of ischemic heart disease and other diseases of the circulatory system: Secondary | ICD-10-CM | POA: Diagnosis not present

## 2021-07-12 DIAGNOSIS — Z808 Family history of malignant neoplasm of other organs or systems: Secondary | ICD-10-CM | POA: Diagnosis not present

## 2021-07-12 DIAGNOSIS — E222 Syndrome of inappropriate secretion of antidiuretic hormone: Secondary | ICD-10-CM | POA: Diagnosis not present

## 2021-07-12 DIAGNOSIS — I3139 Other pericardial effusion (noninflammatory): Secondary | ICD-10-CM | POA: Diagnosis not present

## 2021-07-12 DIAGNOSIS — J439 Emphysema, unspecified: Secondary | ICD-10-CM | POA: Diagnosis not present

## 2021-07-12 DIAGNOSIS — J9601 Acute respiratory failure with hypoxia: Secondary | ICD-10-CM | POA: Diagnosis not present

## 2021-07-12 DIAGNOSIS — K8020 Calculus of gallbladder without cholecystitis without obstruction: Secondary | ICD-10-CM | POA: Diagnosis not present

## 2021-07-12 DIAGNOSIS — I10 Essential (primary) hypertension: Secondary | ICD-10-CM | POA: Diagnosis not present

## 2021-07-12 DIAGNOSIS — R0902 Hypoxemia: Secondary | ICD-10-CM | POA: Diagnosis not present

## 2021-07-12 DIAGNOSIS — R091 Pleurisy: Secondary | ICD-10-CM | POA: Diagnosis not present

## 2021-07-12 DIAGNOSIS — A419 Sepsis, unspecified organism: Principal | ICD-10-CM

## 2021-07-12 DIAGNOSIS — J939 Pneumothorax, unspecified: Secondary | ICD-10-CM | POA: Diagnosis not present

## 2021-07-12 DIAGNOSIS — I2510 Atherosclerotic heart disease of native coronary artery without angina pectoris: Secondary | ICD-10-CM | POA: Diagnosis present

## 2021-07-12 DIAGNOSIS — J9811 Atelectasis: Secondary | ICD-10-CM | POA: Diagnosis not present

## 2021-07-12 DIAGNOSIS — D75838 Other thrombocytosis: Secondary | ICD-10-CM | POA: Diagnosis present

## 2021-07-12 DIAGNOSIS — J869 Pyothorax without fistula: Secondary | ICD-10-CM | POA: Diagnosis not present

## 2021-07-12 DIAGNOSIS — J189 Pneumonia, unspecified organism: Secondary | ICD-10-CM

## 2021-07-12 DIAGNOSIS — Z8709 Personal history of other diseases of the respiratory system: Secondary | ICD-10-CM | POA: Diagnosis not present

## 2021-07-12 DIAGNOSIS — R0602 Shortness of breath: Secondary | ICD-10-CM | POA: Diagnosis not present

## 2021-07-12 DIAGNOSIS — Z800 Family history of malignant neoplasm of digestive organs: Secondary | ICD-10-CM | POA: Diagnosis not present

## 2021-07-12 DIAGNOSIS — R0781 Pleurodynia: Secondary | ICD-10-CM

## 2021-07-12 DIAGNOSIS — R9431 Abnormal electrocardiogram [ECG] [EKG]: Secondary | ICD-10-CM | POA: Diagnosis not present

## 2021-07-12 DIAGNOSIS — R918 Other nonspecific abnormal finding of lung field: Secondary | ICD-10-CM | POA: Diagnosis not present

## 2021-07-12 DIAGNOSIS — F17210 Nicotine dependence, cigarettes, uncomplicated: Secondary | ICD-10-CM | POA: Diagnosis present

## 2021-07-12 DIAGNOSIS — D649 Anemia, unspecified: Secondary | ICD-10-CM | POA: Diagnosis not present

## 2021-07-12 DIAGNOSIS — E669 Obesity, unspecified: Secondary | ICD-10-CM | POA: Diagnosis not present

## 2021-07-12 DIAGNOSIS — K760 Fatty (change of) liver, not elsewhere classified: Secondary | ICD-10-CM | POA: Diagnosis not present

## 2021-07-12 DIAGNOSIS — Z96651 Presence of right artificial knee joint: Secondary | ICD-10-CM

## 2021-07-12 DIAGNOSIS — J948 Other specified pleural conditions: Secondary | ICD-10-CM | POA: Diagnosis not present

## 2021-07-12 DIAGNOSIS — R7989 Other specified abnormal findings of blood chemistry: Secondary | ICD-10-CM | POA: Diagnosis not present

## 2021-07-12 DIAGNOSIS — Z9889 Other specified postprocedural states: Secondary | ICD-10-CM | POA: Diagnosis not present

## 2021-07-12 DIAGNOSIS — Z4682 Encounter for fitting and adjustment of non-vascular catheter: Secondary | ICD-10-CM | POA: Diagnosis not present

## 2021-07-12 DIAGNOSIS — K7689 Other specified diseases of liver: Secondary | ICD-10-CM | POA: Diagnosis not present

## 2021-07-12 DIAGNOSIS — J440 Chronic obstructive pulmonary disease with acute lower respiratory infection: Secondary | ICD-10-CM | POA: Diagnosis not present

## 2021-07-12 DIAGNOSIS — J929 Pleural plaque without asbestos: Secondary | ICD-10-CM | POA: Diagnosis not present

## 2021-07-12 DIAGNOSIS — G8929 Other chronic pain: Secondary | ICD-10-CM | POA: Diagnosis present

## 2021-07-12 LAB — CBC
HCT: 42.2 % (ref 39.0–52.0)
Hemoglobin: 13.8 g/dL (ref 13.0–17.0)
MCH: 29.9 pg (ref 26.0–34.0)
MCHC: 32.7 g/dL (ref 30.0–36.0)
MCV: 91.5 fL (ref 80.0–100.0)
Platelets: 383 10*3/uL (ref 150–400)
RBC: 4.61 MIL/uL (ref 4.22–5.81)
RDW: 13.5 % (ref 11.5–15.5)
WBC: 19.3 10*3/uL — ABNORMAL HIGH (ref 4.0–10.5)
nRBC: 0 % (ref 0.0–0.2)

## 2021-07-12 LAB — COMPREHENSIVE METABOLIC PANEL
ALT: 28 U/L (ref 0–44)
AST: 26 U/L (ref 15–41)
Albumin: 3.2 g/dL — ABNORMAL LOW (ref 3.5–5.0)
Alkaline Phosphatase: 98 U/L (ref 38–126)
Anion gap: 9 (ref 5–15)
BUN: 12 mg/dL (ref 6–20)
CO2: 25 mmol/L (ref 22–32)
Calcium: 8.1 mg/dL — ABNORMAL LOW (ref 8.9–10.3)
Chloride: 100 mmol/L (ref 98–111)
Creatinine, Ser: 0.9 mg/dL (ref 0.61–1.24)
GFR, Estimated: >60 mL/min (ref >60)
Glucose, Bld: 158 mg/dL — ABNORMAL HIGH (ref 70–99)
Potassium: 3.4 mmol/L — ABNORMAL LOW (ref 3.5–5.1)
Sodium: 134 mmol/L — ABNORMAL LOW (ref 135–145)
Total Bilirubin: 0.7 mg/dL (ref 0.3–1.2)
Total Protein: 7 g/dL (ref 6.5–8.1)

## 2021-07-12 LAB — LACTATE DEHYDROGENASE: LDH: 148 U/L (ref 98–192)

## 2021-07-12 LAB — PROCALCITONIN: Procalcitonin: 4.98 ng/mL

## 2021-07-12 LAB — TROPONIN I (HIGH SENSITIVITY)
Troponin I (High Sensitivity): 5 ng/L (ref <18)
Troponin I (High Sensitivity): 6 ng/L (ref <18)

## 2021-07-12 LAB — MRSA NEXT GEN BY PCR, NASAL: MRSA by PCR Next Gen: NOT DETECTED

## 2021-07-12 LAB — PHOSPHORUS: Phosphorus: 3.9 mg/dL (ref 2.5–4.6)

## 2021-07-12 LAB — MAGNESIUM: Magnesium: 1.7 mg/dL (ref 1.7–2.4)

## 2021-07-12 LAB — STREP PNEUMONIAE URINARY ANTIGEN: Strep Pneumo Urinary Antigen: NEGATIVE

## 2021-07-12 LAB — HIV ANTIBODY (ROUTINE TESTING W REFLEX): HIV Screen 4th Generation wRfx: NONREACTIVE

## 2021-07-12 MED ORDER — ARFORMOTEROL TARTRATE 15 MCG/2ML IN NEBU
15.0000 ug | INHALATION_SOLUTION | Freq: Two times a day (BID) | RESPIRATORY_TRACT | Status: DC
  Administered 2021-07-12 – 2021-07-28 (×32): 15 ug via RESPIRATORY_TRACT
  Filled 2021-07-12 (×32): qty 2

## 2021-07-12 MED ORDER — CHLORHEXIDINE GLUCONATE CLOTH 2 % EX PADS
6.0000 | MEDICATED_PAD | Freq: Every day | CUTANEOUS | Status: DC
  Administered 2021-07-12 – 2021-07-23 (×12): 6 via TOPICAL

## 2021-07-12 MED ORDER — DOCUSATE SODIUM 100 MG PO CAPS
100.0000 mg | ORAL_CAPSULE | Freq: Two times a day (BID) | ORAL | Status: DC
  Administered 2021-07-12 – 2021-07-23 (×23): 100 mg via ORAL
  Filled 2021-07-12 (×24): qty 1

## 2021-07-12 MED ORDER — IPRATROPIUM BROMIDE 0.02 % IN SOLN
0.5000 mg | Freq: Four times a day (QID) | RESPIRATORY_TRACT | Status: DC
  Administered 2021-07-12 – 2021-07-14 (×8): 0.5 mg via RESPIRATORY_TRACT
  Filled 2021-07-12 (×7): qty 2.5

## 2021-07-12 MED ORDER — SODIUM CHLORIDE 0.9 % IV BOLUS
1000.0000 mL | Freq: Once | INTRAVENOUS | Status: AC
  Administered 2021-07-12: 1000 mL via INTRAVENOUS

## 2021-07-12 MED ORDER — ACETAMINOPHEN 325 MG PO TABS
650.0000 mg | ORAL_TABLET | Freq: Four times a day (QID) | ORAL | Status: DC | PRN
  Administered 2021-07-15: 02:00:00 650 mg via ORAL
  Filled 2021-07-12: qty 2

## 2021-07-12 MED ORDER — MORPHINE SULFATE (PF) 4 MG/ML IV SOLN
4.0000 mg | Freq: Once | INTRAVENOUS | Status: DC
  Filled 2021-07-12: qty 1

## 2021-07-12 MED ORDER — BUDESONIDE 0.5 MG/2ML IN SUSP
0.5000 mg | Freq: Two times a day (BID) | RESPIRATORY_TRACT | Status: DC
  Administered 2021-07-12 – 2021-07-28 (×33): 0.5 mg via RESPIRATORY_TRACT
  Filled 2021-07-12 (×33): qty 2

## 2021-07-12 MED ORDER — HYDROMORPHONE HCL 1 MG/ML IJ SOLN
1.0000 mg | INTRAMUSCULAR | Status: DC | PRN
  Administered 2021-07-12 – 2021-07-23 (×56): 1 mg via INTRAVENOUS
  Filled 2021-07-12 (×58): qty 1

## 2021-07-12 MED ORDER — METHOCARBAMOL 500 MG PO TABS
500.0000 mg | ORAL_TABLET | Freq: Four times a day (QID) | ORAL | Status: DC | PRN
  Administered 2021-07-12 – 2021-07-25 (×29): 500 mg via ORAL
  Filled 2021-07-12 (×30): qty 1

## 2021-07-12 MED ORDER — POTASSIUM CHLORIDE CRYS ER 20 MEQ PO TBCR
40.0000 meq | EXTENDED_RELEASE_TABLET | Freq: Once | ORAL | Status: AC
  Administered 2021-07-12: 05:00:00 40 meq via ORAL
  Filled 2021-07-12: qty 2

## 2021-07-12 MED ORDER — ACETAMINOPHEN 650 MG RE SUPP: 650.0000 mg | Freq: Four times a day (QID) | RECTAL | Status: DC | PRN

## 2021-07-12 MED ORDER — AMLODIPINE BESYLATE 5 MG PO TABS
5.0000 mg | ORAL_TABLET | Freq: Every day | ORAL | Status: DC
  Administered 2021-07-12 – 2021-07-16 (×5): 5 mg via ORAL
  Filled 2021-07-12 (×5): qty 1

## 2021-07-12 MED ORDER — SODIUM CHLORIDE 0.9 % IV SOLN
500.0000 mg | INTRAVENOUS | Status: AC
  Administered 2021-07-12 – 2021-07-15 (×4): 500 mg via INTRAVENOUS
  Filled 2021-07-12 (×4): qty 500

## 2021-07-12 MED ORDER — HYDROMORPHONE HCL 1 MG/ML IJ SOLN
0.5000 mg | INTRAMUSCULAR | Status: DC | PRN
  Administered 2021-07-12 (×2): 0.5 mg via INTRAVENOUS
  Filled 2021-07-12 (×2): qty 0.5

## 2021-07-12 MED ORDER — LEVALBUTEROL HCL 0.63 MG/3ML IN NEBU
0.6300 mg | INHALATION_SOLUTION | Freq: Four times a day (QID) | RESPIRATORY_TRACT | Status: DC
  Administered 2021-07-12 – 2021-07-14 (×8): 0.63 mg via RESPIRATORY_TRACT
  Filled 2021-07-12 (×9): qty 3

## 2021-07-12 MED ORDER — SODIUM CHLORIDE 0.9 % IV SOLN: 1.0000 g | INTRAVENOUS | Status: DC

## 2021-07-12 MED ORDER — MORPHINE SULFATE (PF) 4 MG/ML IV SOLN
2.0000 mg | Freq: Once | INTRAVENOUS | Status: AC
  Administered 2021-07-12: 03:00:00 2 mg via INTRAVENOUS

## 2021-07-12 MED ORDER — LIDOCAINE 5 % EX PTCH
1.0000 | MEDICATED_PATCH | CUTANEOUS | Status: DC
  Administered 2021-07-12 – 2021-07-18 (×4): 1 via TRANSDERMAL
  Filled 2021-07-12 (×14): qty 1

## 2021-07-12 MED ORDER — LEVALBUTEROL HCL 0.63 MG/3ML IN NEBU
INHALATION_SOLUTION | RESPIRATORY_TRACT | Status: AC
  Filled 2021-07-12: qty 3

## 2021-07-12 MED ORDER — HYDROMORPHONE HCL 1 MG/ML IJ SOLN
1.0000 mg | Freq: Once | INTRAMUSCULAR | Status: AC
  Administered 2021-07-12: 1 mg via INTRAVENOUS
  Filled 2021-07-12: qty 1

## 2021-07-12 MED ORDER — CALCIUM CARBONATE 1250 (500 CA) MG PO TABS
1.0000 | ORAL_TABLET | Freq: Every day | ORAL | Status: DC
  Administered 2021-07-12 – 2021-07-23 (×12): 500 mg via ORAL
  Filled 2021-07-12 (×15): qty 1

## 2021-07-12 MED ORDER — KETOROLAC TROMETHAMINE 15 MG/ML IJ SOLN
15.0000 mg | Freq: Once | INTRAMUSCULAR | Status: AC
  Administered 2021-07-12: 06:00:00 15 mg via INTRAVENOUS
  Filled 2021-07-12: qty 1

## 2021-07-12 MED ORDER — LEVALBUTEROL HCL 0.63 MG/3ML IN NEBU
0.6300 mg | INHALATION_SOLUTION | RESPIRATORY_TRACT | Status: DC | PRN
  Filled 2021-07-12: qty 3

## 2021-07-12 MED ORDER — ENOXAPARIN SODIUM 60 MG/0.6ML IJ SOSY
60.0000 mg | PREFILLED_SYRINGE | Freq: Every day | INTRAMUSCULAR | Status: DC
  Administered 2021-07-12 – 2021-07-23 (×11): 60 mg via SUBCUTANEOUS
  Filled 2021-07-12 (×11): qty 0.6

## 2021-07-12 MED ORDER — SODIUM CHLORIDE 0.9 % IV SOLN
2.0000 g | INTRAVENOUS | Status: AC
  Administered 2021-07-12 – 2021-07-16 (×5): 2 g via INTRAVENOUS
  Filled 2021-07-12 (×5): qty 20

## 2021-07-12 MED ORDER — AMLODIPINE BESYLATE 5 MG PO TABS: 10.0000 mg | ORAL_TABLET | Freq: Every day | ORAL | Status: DC

## 2021-07-12 MED ORDER — ACETAMINOPHEN 325 MG PO TABS
650.0000 mg | ORAL_TABLET | Freq: Four times a day (QID) | ORAL | Status: DC | PRN
  Administered 2021-07-14: 650 mg via ORAL
  Filled 2021-07-12: qty 2

## 2021-07-12 MED ORDER — NICOTINE 21 MG/24HR TD PT24
21.0000 mg | MEDICATED_PATCH | Freq: Every day | TRANSDERMAL | Status: DC
  Filled 2021-07-12 (×12): qty 1

## 2021-07-12 MED ORDER — METHYLPREDNISOLONE SODIUM SUCC 40 MG IJ SOLR: 40.0000 mg | Freq: Two times a day (BID) | INTRAMUSCULAR | Status: DC

## 2021-07-12 MED ORDER — POTASSIUM CHLORIDE IN NACL 20-0.9 MEQ/L-% IV SOLN: INTRAVENOUS | Status: AC

## 2021-07-12 MED ORDER — MORPHINE SULFATE (PF) 2 MG/ML IV SOLN
2.0000 mg | INTRAVENOUS | Status: DC | PRN
  Administered 2021-07-12: 05:00:00 2 mg via INTRAVENOUS
  Filled 2021-07-12: qty 1

## 2021-07-12 MED ORDER — DM-GUAIFENESIN ER 30-600 MG PO TB12
1.0000 | ORAL_TABLET | Freq: Two times a day (BID) | ORAL | Status: DC
  Administered 2021-07-12 – 2021-07-28 (×33): 1 via ORAL
  Filled 2021-07-12 (×33): qty 1

## 2021-07-12 MED ORDER — METHYLPREDNISOLONE SODIUM SUCC 125 MG IJ SOLR
60.0000 mg | Freq: Two times a day (BID) | INTRAMUSCULAR | Status: DC
  Administered 2021-07-12 – 2021-07-16 (×9): 60 mg via INTRAVENOUS
  Filled 2021-07-12 (×9): qty 2

## 2021-07-12 NOTE — Progress Notes (Addendum)
Gave patient Incentive Spirometer and Flutter valve for PNA per Josephine Cables, MD request.  Tried to get patient to do Incentive and patient began to have severe pain on left side.  HR went into 140s and patient could not assume any position to get relief.  RR went into 140s and RN came into room.  MD was called, and patient sat back up and placed on NRB.  Patient is currently still in pain, but no longer feels like he cannot get his breath.  RN to give pain medication to ease patient.  Left IS and flutter at bedside for patient to try again when he feels better.

## 2021-07-12 NOTE — H&P (Signed)
History and Physical  Victor [name] MHW:[id number] DOB: Jul 14, 1976 DOA: 07/11/2021  Referring physician: Davonna Belling, MD PCP: [name], DO  Patient coming from: Home  Chief Complaint: Shortness of breath  HPI: Victor [name] is a 45 y.o. male with medical history significant for hypertension, s/p right knee replacement, tobacco use who presents to the emergency department due to shortness of breath which started on Friday (11/11), this was associated with sharp pain on left lateral chest area.  He complained of subjective fever and he went to an urgent care today where he was diagnosed to have bronchitis and was prescribed with steroids and antibiotics which was yet to be filled.  On returning home, patient presents with worsening shortness of breath and left-sided chest pain which worsens with cough and breathing.  Patient also complained of upper abdominal pain which worsens with movements.  EMS was activated, on arrival of EMS team, O2 sat was 86% on room air, supplemental oxygen was provided at 5 L en routes to the hospital and O2 sat was 95% on arrival.  ED Course:  In the emergency department, he was tachypneic and tachycardic, BP was 163/92 and O2 sat was 95% on supplemental oxygen via [employer] at 5 LPM.  Work-up in the ED showed leukocytosis, BMP showed hypokalemia, hypocalcemia, lactic acid was negative, urinalysis was negative.  Influenza A, B, SARS coronavirus 2 was negative. Chest x-ray showed new finding of moderate left pleural effusion with overlying atelectasis or consolidation in left lower lung field CT angiography chest with contrast showed no evidence of PE. Moderate to marked severity left lower lobe and left upper lobe atelectasis and/or infiltrate. Mild posterior right basilar atelectasis and/or infiltrate. Small to moderate size left-sided pleural effusion. Patient was treated with IV antibiotics, Dilaudid and morphine were given and IV hydration was provided.   Hospitalist was asked to admit patient for further evaluation and management.   Review of Systems: Constitutional: Positive for subjective fever.  HENT: Negative for ear pain and sore throat.   Eyes: Negative for pain and visual disturbance.  Respiratory: Positive for cough  and shortness of breath.   Cardiovascular: Positive for chest pain and negative for palpitations.  Gastrointestinal: Positive for abdominal pain and vomiting.  Endocrine: Negative for polyphagia and polyuria.  Genitourinary: Negative for decreased urine volume, dysuria, enuresis Musculoskeletal: Negative for arthralgias and back pain.  Skin: Negative for color change and rash.  Allergic/Immunologic: Negative for immunocompromised state.  Neurological: Negative for tremors, syncope, speech difficulty, light-headedness and headaches.  Hematological: Does not bruise/bleed easily.  All other systems reviewed and are negative   Past Medical History:  Diagnosis Date   GERD (gastroesophageal reflux disease)    History of stomach ulcers    Hypertension    Past Surgical History:  Procedure Laterality Date   ANTERIOR CRUCIATE LIGAMENT REPAIR Right 05/19/2018   Procedure: RIGHT KNEE ARTHROSCOPY WITH ANTERIOR CRUCIATE LIGAMENT (ACL) REPAIR and MEDIAL MENISECTOMY;  Surgeon: [name], MD;  Location: [facility];  Service: Orthopedics;  Laterality: Right;   APPENDECTOMY     HARDWARE REMOVAL Right 09/21/2019   Procedure: HARDWARE REMOVAL (RICHARD'S STAPLE) RIGHT KNEE;  Surgeon: [name], MD;  Location: [facility];  Service: Orthopedics;  Laterality: Right;   KNEE ARTHROSCOPY WITH MEDIAL MENISECTOMY Right 05/29/2017   Procedure: KNEE ARTHROSCOPY WITH MEDIAL MENISECTOMY and lateral meniscectomy;  Surgeon: [name], MD;  Location: [facility];  Service: Orthopedics;  Laterality: Right;   KNEE ARTHROSCOPY WITH MEDIAL MENISECTOMY  Right 02/05/2018   Procedure: RIGHT KNEE ARTHROSCOPY WITH PARTIAL MEDIAL AND LATERAL  MENISECTOMY;  Surgeon: [name], MD;  Location: [facility];  Service: Orthopedics;  Laterality: Right;   TOTAL KNEE ARTHROPLASTY Right 03/28/2020   Procedure: TOTAL KNEE ARTHROPLASTY;  Surgeon: [name], MD;  Location: [facility];  Service: Orthopedics;  Laterality: Right;   TOTAL KNEE REVISION Right 08/08/2020   Procedure: REVISION OF RIGHT TOTAL KNEE  TIBIAL COMPONENTS ONLY;  Surgeon: [name], MD;  Location: [facility];  Service: Orthopedics;  Laterality: Right;    Social History:  reports that he has been smoking cigarettes. He started smoking about 30 years ago. He has a 7.50 pack-year smoking history. He quit smokeless tobacco use about 3 years ago.  His smokeless tobacco use included chew. He reports that he does not drink alcohol and does not use drugs.   No Known Allergies  Family History  Problem Relation Age of Onset   Diabetes Mother    Heart failure Mother    Cancer Mother    Pancreatic cancer Mother    Cancer Father    Throat cancer Father    Healthy Sister    Head & neck cancer Brother    Bone cancer Maternal Grandmother    Diabetes Maternal Grandmother    CAD Maternal Grandfather    Brain cancer Maternal Grandfather    Colon cancer Paternal Grandfather     Prior to Admission medications   Medication Sig Start Date End Date Taking? Authorizing Provider  aspirin 325 MG tablet Take 1 tablet by mouth daily before breakfast. 08/12/20  Yes [provider]  promethazine-dextromethorphan (PROMETHAZINE-DM) 6.25-15 MG/5ML syrup Take by mouth. 07/11/21 07/18/21 Yes [provider]  amitriptyline (ELAVIL) 10 MG tablet Take 10 mg by mouth at bedtime. 10/20/20   [provider]  amLODipine (NORVASC) 10 MG tablet Take 1 tablet (10 mg total) by mouth daily. 07/02/21   [name], DO  aspirin EC 325 MG EC tablet Take 1 tablet (325 mg total) by mouth daily with breakfast. 08/12/20   [name], MD  doxycycline (VIBRA-TABS)  100 MG tablet Take 100 mg by mouth 2 (two) times daily. 07/11/21   [provider]  methocarbamol (ROBAXIN) 500 MG tablet Take 1 tablet (500 mg total) by mouth every 6 (six) hours as needed for muscle spasms. 10/13/20   [name], MD  naloxone [facility]) nasal spray 4 mg/0.1 mL  10/20/20   [provider]  oxyCODONE-acetaminophen (PERCOCET) 7.5-325 MG tablet Take 1 tablet by mouth every 6 (six) hours as needed for severe pain.    [provider]  predniSONE (DELTASONE) 20 MG tablet Take 20 mg by mouth 2 (two) times daily. 07/11/21   [provider]  pregabalin (LYRICA) 75 MG capsule Take 75 mg by mouth 3 (three) times daily. 02/12/21   [provider]  traZODone (DESYREL) 50 MG tablet Take 50 mg by mouth at bedtime.    [provider]    Physical Exam: BP 130/82   Pulse (!) 118   Temp 98.3 F (36.8 C)   Resp (!) 23   Ht '5\' 11"'  (1.803 m)   Wt 113.9 kg   SpO2 93%   BMI 35.01 kg/m   General: 45 y.o. year-old male well developed well nourished in mild acute distress.  Alert and oriented x3. HEENT: NCAT, EOMI Neck: Supple, trachea medial Cardiovascular: Tachycardia.  Regular rate and rhythm with no rubs or gallops.  No thyromegaly or JVD noted.  No lower extremity edema. 2/4 pulses in all 4 extremities. Respiratory: Tachypnea.  Bilateral upper lobe expiratory wheezes, decreased breath sounds worse in lower lobes and bilateral rhonchi in lower lobes on auscultation  Abdomen: Soft, nontender nondistended with normal bowel sounds x4 quadrants. Muskuloskeletal: Tender to palpation of left-sided lateral chest wall.  No cyanosis, clubbing or edema noted bilaterally Neuro: CN II-XII intact, strength 5/5 x 4, sensation, reflexes intact Skin: No ulcerative lesions noted or rashes Psychiatry: Judgement and insight appear normal. Mood is appropriate for condition and setting          Labs on Admission:  Basic Metabolic Panel: Recent Labs   Lab 07/11/21 2130  NA 135  K 3.2*  CL 97*  CO2 26  GLUCOSE 129*  BUN 14  CREATININE 0.90  CALCIUM 8.8*   Liver Function Tests: Recent Labs  Lab 07/11/21 2130  AST 22  ALT 23  ALKPHOS 101  BILITOT 0.6  PROT 8.2*  ALBUMIN 3.8   No results for input(s): LIPASE, AMYLASE in the last 168 hours. No results for input(s): AMMONIA in the last 168 hours. CBC: Recent Labs  Lab 07/11/21 2130  WBC 15.8*  NEUTROABS 13.5*  HGB 14.6  HCT 43.3  MCV 88.9  PLT 388   Cardiac Enzymes: No results for input(s): CKTOTAL, CKMB, CKMBINDEX, TROPONINI in the last 168 hours.  BNP (last 3 results) No results for input(s): BNP in the last 8760 hours.  ProBNP (last 3 results) No results for input(s): PROBNP in the last 8760 hours.  CBG: No results for input(s): GLUCAP in the last 168 hours.  Radiological Exams on Admission: DG Chest 2 View  Result Date: 07/11/2021 CLINICAL DATA:  Shortness of breath and oxygen desaturation. EXAM: CHEST - 2 VIEW COMPARISON:  PA Lat 02/16/2019. FINDINGS: The cardiac size is normal. Central vessels are normal caliber. There is interval low inspiration and development of a moderate layering left pleural effusion with overlying atelectasis or consolidation of the left lower lung field. The hypoexpanded right lung is clear. No pneumothorax is seen. Thoracic cage is intact. IMPRESSION: New finding of moderate left pleural effusion with overlying atelectasis or consolidation in left lower lung field. Clinical correlation and follow-up recommended. Electronically Signed   By: Telford Nab M.D.   On: 07/11/2021 22:04   CT Angio Chest PE W and/or Wo Contrast  Result Date: 07/11/2021 CLINICAL DATA:  Shortness of breath x2 days. EXAM: CT ANGIOGRAPHY CHEST WITH CONTRAST TECHNIQUE: Multidetector CT imaging of the chest was performed using the standard protocol during bolus administration of intravenous contrast. Multiplanar CT image reconstructions and MIPs were obtained  to evaluate the vascular anatomy. CONTRAST:  124m OMNIPAQUE IOHEXOL 350 MG/ML SOLN COMPARISON:  None. FINDINGS: Cardiovascular: The subsegmental pulmonary arteries are limited in evaluation secondary to suboptimal opacification with intravenous contrast as well as areas of overlying artifact. No evidence of pulmonary embolism. Normal heart size with mild coronary artery calcification. No pericardial effusion. Mediastinum/Nodes: No enlarged mediastinal, hilar, or axillary lymph nodes. Thyroid gland, trachea, and esophagus demonstrate no significant findings. Lungs/Pleura: Moderate to marked severity atelectasis and/or infiltrate is seen within the left lower lobe and along the periphery of the left upper lobe. Mild posterior right basilar atelectasis and/or infiltrate is also seen. A small to moderate size left-sided pleural effusion is seen. No pneumothorax is identified. Upper Abdomen: No acute abnormality. Musculoskeletal: No chest wall abnormality. No acute or significant osseous findings. Review of the MIP images confirms  the above findings. IMPRESSION: 1. Moderate to marked severity left lower lobe and left upper lobe atelectasis and/or infiltrate. 2. Mild posterior right basilar atelectasis and/or infiltrate. 3. Small to moderate size left-sided pleural effusion. 4. Limited evaluation of the pulmonary arteries, as described above, without definite evidence of pulmonary embolism. Electronically Signed   By: Virgina Norfolk M.D.   On: 07/11/2021 23:14    EKG: I independently viewed the EKG done and my findings are as followed: Sinus tachycardia at a rate of 132 bpm  Assessment/Plan Present on Admission:  CAP (community acquired pneumonia)  Tobacco abuse  Essential hypertension  Principal Problem:   CAP (community acquired pneumonia) Active Problems:   Tobacco abuse   Essential hypertension   S/P total knee replacement, right   Acute respiratory failure with hypoxia (HCC)   Pleural effusion on  left   Hypocalcemia   Hypokalemia   Sepsis ([place])   Pleuritic chest pain   Sepsis due to acute respiratory failure with hypoxia secondary to CAP POA with superimposed left-sided pleural effusion Chest x-ray showed moderate left pleural effusion with overlying atelectasis or consolidation in left lower lung field CT negative chest showed left lower lobe and left upper lobe atelectasis/consolidation or infiltrate Patient was tachycardic, tachypneic and presents with leukocytosis (met SIRS criteria), source of infection was lung due to pneumonia and respiratory failure with hypoxia, thereby meeting sepsis criteria. PORT/PSI of 87 points  indicating 0.9-2.8% mortality Patient was started on IV ceftriaxone and azithromycin, we shall continue with same at this time with plan to de-escalate/discontinue based on blood culture, sputum culture, urine Legionella, strep pneumo, procalcitonin Continue Mucinex,  Xopenex, Solu-Medrol Continue  incentive spirometry, flutter valve  IR will be consulted for possible thoracentesis with subsequent pleural fluid analysis will be attempted  Left-sided pleuritic chest pain in the setting of above Status post total right knee replacement Continue Tylenol as needed for mild pain Continue lidocaine patch for pleuritic chest pain Continue Robaxin Continue IV morphine 2 mg every 4 hours as needed for moderate/severe pain  Hypokalemia K+ 3.2, this will be replenished  Hypocalcemia Ca 8.8, continue Os-Cal  Essential hypertension Continue amlodipine per home regimen  Tobacco abuse Patient has 35-pack-year smoking history, was counseled on tobacco abuse cessation Continue nicotine patch  DVT prophylaxis: SCDs (consider starting patient on chemoprophylaxis if no indication for thoracentesis)  Code Status: Full code  Family Communication: Wife at bedside (all questions answered to satisfaction)  Disposition Plan:  Patient is from:                         home Anticipated DC to:                   SNF or family members home Anticipated DC date:               2-3 days Anticipated DC barriers:          Patient requires inpatient management due to acute respiratory failure with hypoxia secondary to pleural effusion with possible need for thoracentesis    Consults called: None  Admission status: Inpatient    Bernadette Hoit MD [facility]  07/12/2021, 3:35 AM

## 2021-07-12 NOTE — Progress Notes (Signed)
Patient has been voiding in urinal, per patient and patient wife. Writer emptied once but wife stated she emptied it several times today. Wife now aware to let us know when used so we can measure urine output.

## 2021-07-12 NOTE — Consult Note (Signed)
NAME:  Victor [name], MRN:  [id number], DOB:  08/17/1976, LOS: 0 ADMISSION DATE:  07/11/2021, CONSULTATION DATE:  07/12/2021 REFERRING MD:  Dr. Carles Collet, [facility], CHIEF COMPLAINT:  Short of breath   History of Present Illness:  45 yo male smoker developed pain in his left chest on 07/06/21.  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  He then developed cough with rust colored sputum, chills, sweats, and shakes.  Appetite has decreased.  Has pleuritic pain on left side causing splinting with respiration.  In ER he had SpO2 86% on room air.  CT chest showed LLL infiltrate and small effusion.  He was started on antibiotics, supplemental oxygen and solumedrol.  PCCM asked to assist with management in ICU.  Pertinent  Medical History  GERD, HTN, Stomach ulcer  Significant Hospital Events: Including procedures, antibiotic start and stop dates in addition to other pertinent events   11/16 admit 11/17 add solumedrol for pleurisy  Studies:  CT angio chest 07/11/21 >> LLL infiltrate, small Lt pleural effusion U/S chest 07/12/21 >> small, complex effusion   Interim History / Subjective:  Pleuritic chest pain is limiting his breathing efforts.    Objective   Blood pressure 139/90, pulse 99, temperature (!) 97.2 F (36.2 C), temperature source Axillary, resp. rate (!) 30, height 5\' 11"  (1.803 m), weight 113.2 kg, SpO2 94 %.        Intake/Output Summary (Last 24 hours) at 07/12/2021 1345 Last data filed at 07/12/2021 0851 Gross per 24 hour  Intake 240 ml  Output --  Net 240 ml   Filed Weights   07/11/21 2058 07/12/21 0515  Weight: 113.9 kg 113.2 kg    Examination:  General - alert, anxious Eyes - pupils reactive ENT - no sinus tenderness, no stridor Cardiac - regular, tachycardic Chest - decreased breath sounds with faint crackles on Lt Abdomen - soft, non tender, + bowel sounds Extremities - no cyanosis, clubbing, or edema Skin - no rashes Neuro  - normal strength, moves extremities, follows commands  Resolved Hospital Problem list     Assessment & Plan:   Bacterial community acquired pneumonia with lobar consolidation of LLL. - day 2 of rocephin and zithromax - f/u blood culture from 11/16, and pneumococcal/legionella antigens and HIV from 11/17 - f/u chest xray on 11/18  Lt pleuritic chest pain. - reviewed Lt pleural space with bedside ultrasound on 11/17, and effusion is still small - continue solumedrol, analgesics  Acute hypoxic respiratory failure. - goal SpO2 > 92% - monitor need for intubation and mechanical ventilation while in ICU  Hypokalemia. - f/u BMET  Steroid induced hyperglycemia. - f/u blood sugar on BMET  Leukocytosis. - from infection and steroids - f/u CBC  Updated pt's wife at bedside.  D/w Dr. Carles Collet.  Best Practice (right click and "Reselect all SmartList Selections" daily)   Diet/type: Regular consistency (see orders) DVT prophylaxis: LMWH GI prophylaxis: N/A Lines: N/A Foley:  N/A Code Status:  full code Last date of multidisciplinary goals of care discussion [x]   Labs   CBC: Recent Labs  Lab 07/11/21 2130 07/12/21 0443  WBC 15.8* 19.3*  NEUTROABS 13.5*  --   HGB 14.6 13.8  HCT 43.3 42.2  MCV 88.9 91.5  PLT 388 025    Basic Metabolic Panel: Recent Labs  Lab 07/11/21 2130 07/12/21 0331  NA 135 134*  K 3.2* 3.4*  CL 97* 100  CO2 26 25  GLUCOSE 129* 158*  BUN 14 12  CREATININE 0.90 0.90  CALCIUM 8.8* 8.1*  MG  --  1.7  PHOS  --  3.9   GFR: Estimated Creatinine Clearance: 129.9 mL/min (by C-G formula based on SCr of 0.9 mg/dL). Recent Labs  Lab 07/11/21 2130 07/11/21 2319 07/12/21 0331 07/12/21 0443  PROCALCITON  --   --  4.98  --   WBC 15.8*  --   --  19.3*  LATICACIDVEN 1.6 1.1  --   --     Liver Function Tests: Recent Labs  Lab 07/11/21 2130 07/12/21 0331  AST 22 26  ALT 23 28  ALKPHOS 101 98  BILITOT 0.6 0.7  PROT 8.2* 7.0  ALBUMIN 3.8 3.2*    No results for input(s): LIPASE, AMYLASE in the last 168 hours. No results for input(s): AMMONIA in the last 168 hours.  ABG No results found for: PHART, PCO2ART, PO2ART, HCO3, TCO2, ACIDBASEDEF, O2SAT   Coagulation Profile: Recent Labs  Lab 07/11/21 2130  INR 1.0    Cardiac Enzymes: No results for input(s): CKTOTAL, CKMB, CKMBINDEX, TROPONINI in the last 168 hours.  HbA1C: HB A1C (BAYER DCA - WAIVED)  Date/Time Value Ref Range Status  03/03/2018 02:45 PM 5.4 <7.0 % Final    Comment:                                          Diabetic Adult            <7.0                                       Healthy Adult        4.3 - 5.7                                                           (DCCT/NGSP) American Diabetes Association's Summary of Glycemic Recommendations for Adults with Diabetes: Hemoglobin A1c <7.0%. More stringent glycemic goals (A1c <6.0%) [place] further reduce complications at the cost of increased risk of hypoglycemia.     CBG: No results for input(s): GLUCAP in the last 168 hours.  Review of Systems:   Reviewed and negative  Past Medical History:  He,  has a past medical history of GERD (gastroesophageal reflux disease), History of stomach ulcers, and Hypertension.   Surgical History:   Past Surgical History:  Procedure Laterality Date   ANTERIOR CRUCIATE LIGAMENT REPAIR Right 05/19/2018   Procedure: RIGHT KNEE ARTHROSCOPY WITH ANTERIOR CRUCIATE LIGAMENT (ACL) REPAIR and MEDIAL MENISECTOMY;  Surgeon: [name], MD;  Location: [facility];  Service: Orthopedics;  Laterality: Right;   APPENDECTOMY     HARDWARE REMOVAL Right 09/21/2019   Procedure: HARDWARE REMOVAL (RICHARD'S STAPLE) RIGHT KNEE;  Surgeon: [name], MD;  Location: [facility];  Service: Orthopedics;  Laterality: Right;   KNEE ARTHROSCOPY WITH MEDIAL MENISECTOMY Right 05/29/2017   Procedure: KNEE ARTHROSCOPY WITH MEDIAL MENISECTOMY and lateral meniscectomy;  Surgeon: [name],  MD;  Location: [facility];  Service: Orthopedics;  Laterality: Right;   KNEE ARTHROSCOPY WITH MEDIAL MENISECTOMY Right 02/05/2018   Procedure: RIGHT KNEE ARTHROSCOPY WITH  PARTIAL MEDIAL AND LATERAL MENISECTOMY;  Surgeon: [name], MD;  Location: [facility];  Service: Orthopedics;  Laterality: Right;   TOTAL KNEE ARTHROPLASTY Right 03/28/2020   Procedure: TOTAL KNEE ARTHROPLASTY;  Surgeon: [name], MD;  Location: [facility];  Service: Orthopedics;  Laterality: Right;   TOTAL KNEE REVISION Right 08/08/2020   Procedure: REVISION OF RIGHT TOTAL KNEE  TIBIAL COMPONENTS ONLY;  Surgeon: [name], MD;  Location: [facility];  Service: Orthopedics;  Laterality: Right;     Social History:   reports that he has been smoking cigarettes. He started smoking about 30 years ago. He has a 7.50 pack-year smoking history. He quit smokeless tobacco use about 3 years ago.  His smokeless tobacco use included chew. He reports that he does not drink alcohol and does not use drugs.   Family History:  His family history includes Bone cancer in his maternal grandmother; Brain cancer in his maternal grandfather; CAD in his maternal grandfather; Cancer in his father and mother; Colon cancer in his paternal grandfather; Diabetes in his maternal grandmother and mother; Head & neck cancer in his brother; Healthy in his sister; Heart failure in his mother; Pancreatic cancer in his mother; Throat cancer in his father.   Allergies No Known Allergies   Home Medications  Prior to Admission medications   Medication Sig Start Date End Date Taking? Authorizing Provider  amitriptyline (ELAVIL) 10 MG tablet Take 10 mg by mouth at bedtime. 10/20/20  Yes [provider]  amLODipine (NORVASC) 10 MG tablet Take 1 tablet (10 mg total) by mouth daily. 07/02/21  Yes [name], DO  aspirin EC 325 MG EC tablet Take 1 tablet (325 mg total) by mouth daily with breakfast. 08/12/20  Yes [name], MD   cholecalciferol (VITAMIN D3) 25 MCG (1000 UNIT) tablet Take 1,000 Units by mouth daily.   Yes [provider]  docusate sodium (COLACE) 100 MG capsule Take 100 mg by mouth daily.   Yes [provider]  methocarbamol (ROBAXIN) 500 MG tablet Take 1 tablet (500 mg total) by mouth every 6 (six) hours as needed for muscle spasms. 10/13/20  Yes [name], MD  oxyCODONE-acetaminophen (PERCOCET) 7.5-325 MG tablet Take 1 tablet by mouth every 6 (six) hours as needed for severe pain.   Yes [provider]  promethazine-dextromethorphan (PROMETHAZINE-DM) 6.25-15 MG/5ML syrup Take by mouth. 07/11/21 07/18/21 Yes [provider]  traZODone (DESYREL) 50 MG tablet Take 50 mg by mouth at bedtime.   Yes [provider]  doxycycline (VIBRA-TABS) 100 MG tablet Take 100 mg by mouth 2 (two) times daily. Patient not taking: Reported on 07/12/2021 07/11/21   [provider]  naloxone [facility]) nasal spray 4 mg/0.1 mL  10/20/20   [provider]  predniSONE (DELTASONE) 20 MG tablet Take 20 mg by mouth 2 (two) times daily. Patient not taking: Reported on 07/12/2021 07/11/21   [provider]  pregabalin (LYRICA) 75 MG capsule Take 75 mg by mouth 3 (three) times daily. Patient not taking: Reported on 07/12/2021 02/12/21   [provider]     Critical care time: 42 minutes  Chesley Mires, MD [place] Pager - [phone] 07/12/2021, 2:15 PM

## 2021-07-12 NOTE — Progress Notes (Signed)
Responded to nursing call:  continued left side cp and sob and spouse questions.     Subjective: Patient states he is not getting hardly any relief of his left sided CP with dilaudid 0.5 mg.  Denies n/v/d.  Spouse unhappy with slow progress and poor pain control.  Vitals:   07/12/21 0819 07/12/21 0900 07/12/21 1000 07/12/21 1100  BP: (!) 141/86 137/85 (!) 141/81 139/90  Pulse: 95 95 100 99  Resp: (!) 35 (!) 34 (!) 30   Temp: (!) 97.2 F (36.2 C)     TempSrc: Axillary     SpO2: 97% 94% 92% 94%  Weight:      Height:       CV--RRR Lung--CTA Abd--soft+BS/NT   Assessment/Plan: Pneumonia with parapneumonic effusion -updated patient and spouse at 1305 at the bedside regarding plan of care and current results as they are available -increased dilaudid to 1 mg IV q 2hr -discussed with and consulted pulmonary, Dr. Halford Chessman -pt and spouse told to expect Dr. Halford Chessman to see patient -continue bronchodilators -start IV solumedrol -updated pt and spouse that [place] of chest showed loculated fluid so radiology concerned about lack of safe window for thoracocentesis; hence, Dr. Halford Chessman was consulted. -check troponin, EKG  Total time 20 min in addition to that spent this am   Orson Eva, DO [facility]

## 2021-07-12 NOTE — Progress Notes (Signed)
PHARMACIST - PHYSICIAN COMMUNICATION  CONCERNING:  Enoxaparin (Lovenox) for DVT Prophylaxis    RECOMMENDATION: Patient was prescribed enoxaprin 40mg  q24 hours for VTE prophylaxis.   Filed Weights   07/11/21 2058 07/12/21 0515  Weight: 113.9 kg (251 lb) 113.2 kg (249 lb 9 oz)   Body mass index is 34.81 kg/m.  Estimated Creatinine Clearance: 129.9 mL/min (by C-G formula based on SCr of 0.9 mg/dL).  Based on [place] patient is candidate for enoxaparin 0.5mg /kg TBW SQ every 24 hours based on BMI being >30.  DESCRIPTION: Pharmacy has adjusted enoxaparin dose per [facility] policy.  Patient is now receiving enoxaparin 60 mg every 24 hours    Ena Dawley, PharmD Clinical Pharmacist  07/12/2021 8:15 AM

## 2021-07-12 NOTE — Progress Notes (Signed)
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  The patient states that his shortness of breath began on the evening of 07/05/2021.  He has had a nonproductive cough.  He denies any fevers, chills, headache, neck pain, nausea, vomiting, diarrhea, abdominal pain.  However he has had some sharp pleuritic type left-sided chest pain rating down to his left flank and left upper quadrant.  He has not had any diarrhea, hematochezia, melena.  He denies any sick contacts.  He denies any new medications.  He continues to smoke 1 pack a day.  Patient went to urgent care on the morning of 07/11/2021.  However, he had worsening shortness of breath when he got home resulting in activation of EMS.  He was not able to pick up his prescriptions prescribed at urgent care including antibiotics and the steroids. The emergency department, the patient was afebrile but tachycardic up to 110 with oxygen saturation 90-94% on 6 L.  He was started on Rocephin and azithromycin.  CTA chest was negative for PE but showed moderate to severe atelectasis/infiltrate of the left lower lobe and left upper lobe with small to moderate pleural effusion.  Assessment/Plan: Sepsis -Present on admission -Presented with leukocytosis, tachycardia, tachypnea into the 30s -Secondary to pneumonia -Lactic acid peaked 1.6 -PCT 4.98 -Continue ceftriaxone and azithromycin  Acute respiratory failure with hypoxia -Presented with saturation 86% on room air and tachypnea to the 30s -Secondary to pneumonia and left pleural effusion -Currently on 9 L high flow -Wean oxygen as tolerated for saturation greater 90%  Left pleural effusion -Request  thoracocentesis  Lobar pneumonia -Continue ceftriaxone and azithromycin -Start Xopenex and Atrovent -start pulmicort  Hypertension -Holding amlodipine temporarily  Tobacco abuse -Tobacco cessation discussed -He has a 30-pack-year history  Chronic Right knee pain -PDMP reviewed -he receives monthly percocet 7.5 and pregabalin  Hypokalemia -replete -check mag  Abdominal pain -CT abd/pelvis      Status is: Inpatient  Remains inpatient appropriate because: severity of illness requiring high flow oxygen, IV antibiotics        Family Communication:   spouse updated at bedside 11/17  Consultants:  none  Code Status:  FULL   DVT Prophylaxis:  [place] Heparin / [place] Lovenox   Procedures: As Listed in Progress Note Above  Antibiotics: Ceftriaxone 11/16>> Azithro 11/16>>     The patient is critically ill with multiple organ systems failure and requires high complexity decision making for assessment and support, frequent evaluation and titration of therapies, application of advanced monitoring technologies and extensive interpretation of multiple databases.  Critical care time - 35 mins.     Subjective: Patient complains of sob, not much better.  Complains of pleuritic left side cp.  Denies n/v/d, headache, hemoptysis.  Has LUQ and left flank pain  Objective: Vitals:   07/12/21 0515 07/12/21 0516 07/12/21 0600 07/12/21 0700  BP:  140/89 (!) 128/91 120/75  Pulse:  (!) 113 (!) 105 95  Resp:  (!) 28 (!) 33 18  Temp: (!) 97 F (36.1 C)     TempSrc: Oral     SpO2:  90% 94% 96%  Weight: 113.2 kg     Height: 5\' 11"  (1.803 m)  No intake or output data in the 24 hours ending 07/12/21 0726 Weight change:  Exam:  General:  Pt is alert, follows commands appropriately, not in acute distress HEENT: No icterus, No thrush, No neck mass, [place]/AT Cardiovascular: RRR, S1/S2, no rubs, no gallops Respiratory: mild bibasilar wheeze.  Diminished BS on left; scattered  rales Abdomen: Soft/+BS, LUQ and LLQ tender, non distended, no guarding Extremities: No edema, No lymphangitis, No petechiae, No rashes, no synovitis   Data Reviewed: I have personally reviewed following labs and imaging studies Basic Metabolic Panel: Recent Labs  Lab 07/11/21 2130 07/12/21 0331  NA 135 134*  K 3.2* 3.4*  CL 97* 100  CO2 26 25  GLUCOSE 129* 158*  BUN 14 12  CREATININE 0.90 0.90  CALCIUM 8.8* 8.1*  MG  --  1.7  PHOS  --  3.9   Liver Function Tests: Recent Labs  Lab 07/11/21 2130 07/12/21 0331  AST 22 26  ALT 23 28  ALKPHOS 101 98  BILITOT 0.6 0.7  PROT 8.2* 7.0  ALBUMIN 3.8 3.2*   No results for input(s): LIPASE, AMYLASE in the last 168 hours. No results for input(s): AMMONIA in the last 168 hours. Coagulation Profile: Recent Labs  Lab 07/11/21 2130  INR 1.0   CBC: Recent Labs  Lab 07/11/21 2130 07/12/21 0443  WBC 15.8* 19.3*  NEUTROABS 13.5*  --   HGB 14.6 13.8  HCT 43.3 42.2  MCV 88.9 91.5  PLT 388 383   Cardiac Enzymes: No results for input(s): CKTOTAL, CKMB, CKMBINDEX, TROPONINI in the last 168 hours. BNP: Invalid input(s): POCBNP CBG: No results for input(s): GLUCAP in the last 168 hours. HbA1C: No results for input(s): HGBA1C in the last 72 hours. Urine analysis:    Component Value Date/Time   COLORURINE YELLOW 07/11/2021 2245   APPEARANCEUR CLEAR 07/11/2021 2245   LABSPEC 1.021 07/11/2021 2245   PHURINE 5.0 07/11/2021 2245   GLUCOSEU NEGATIVE 07/11/2021 2245   HGBUR MODERATE (A) 07/11/2021 2245   BILIRUBINUR NEGATIVE 07/11/2021 2245   KETONESUR NEGATIVE 07/11/2021 2245   PROTEINUR 30 (A) 07/11/2021 2245   NITRITE NEGATIVE 07/11/2021 2245   LEUKOCYTESUR NEGATIVE 07/11/2021 2245   Sepsis Labs: @LABRCNTIP (procalcitonin:4,lacticidven:4) ) Recent Results (from the past 240 hour(s))  Resp Panel by RT-PCR (Flu A&B, Covid) Nasopharyngeal Swab     Status: None   Collection Time: 07/11/21  9:05 PM   Specimen:  Nasopharyngeal Swab; Nasopharyngeal(NP) swabs in vial transport medium  Result Value Ref Range Status   SARS Coronavirus 2 by RT PCR NEGATIVE NEGATIVE Final    Comment: (NOTE) SARS-CoV-2 target nucleic acids are NOT DETECTED.  The SARS-CoV-2 RNA is generally detectable in upper respiratory specimens during the acute phase of infection. The lowest concentration of SARS-CoV-2 viral copies this assay can detect is 138 copies/mL. A negative result does not preclude SARS-Cov-2 infection and should not be used as the sole basis for treatment or other patient management decisions. A negative result [place] occur with  improper specimen collection/handling, submission of specimen other than nasopharyngeal swab, presence of viral mutation(s) within the areas targeted by this assay, and inadequate number of viral copies(<138 copies/mL). A negative result must be combined with clinical observations, patient history, and epidemiological information. The expected result is Negative.  Fact Sheet for Patients:  EntrepreneurPulse.com.au  Fact Sheet for Healthcare Providers:  IncredibleEmployment.be  This test is no t yet approved or cleared by the [place] FDA and  has been authorized for detection and/or diagnosis of  SARS-CoV-2 by FDA under an Emergency Use Authorization (EUA). This EUA will remain  in effect (meaning this test can be used) for the duration of the COVID-19 declaration under Section 564(b)(1) of the Act, 21 U.S.C.section 360bbb-3(b)(1), unless the authorization is terminated  or revoked sooner.       Influenza A by PCR NEGATIVE NEGATIVE Final   Influenza B by PCR NEGATIVE NEGATIVE Final    Comment: (NOTE) The Xpert Xpress SARS-CoV-2/FLU/RSV plus assay is intended as an aid in the diagnosis of influenza from Nasopharyngeal swab specimens and should not be used as a sole basis for treatment. Nasal washings and aspirates are unacceptable for  Xpert Xpress SARS-CoV-2/FLU/RSV testing.  Fact Sheet for Patients: EntrepreneurPulse.com.au  Fact Sheet for Healthcare Providers: IncredibleEmployment.be  This test is not yet approved or cleared by the [place] FDA and has been authorized for detection and/or diagnosis of SARS-CoV-2 by FDA under an Emergency Use Authorization (EUA). This EUA will remain in effect (meaning this test can be used) for the duration of the COVID-19 declaration under Section 564(b)(1) of the Act, 21 U.S.C. section 360bbb-3(b)(1), unless the authorization is terminated or revoked.  Performed at [facility], [address] [place], [place] [address]   Culture, blood (Routine x 2)     Status: None (Preliminary result)   Collection Time: 07/11/21  9:30 PM   Specimen: BLOOD  Result Value Ref Range Status   Specimen Description BLOOD LEFT ANTECUBITAL  Final   Special Requests   Final    BOTTLES DRAWN AEROBIC AND ANAEROBIC Blood Culture adequate volume Performed at [facility], [address] [place], [place] [address]    Culture PENDING  Incomplete   Report Status PENDING  Incomplete  Culture, blood (Routine x 2)     Status: None (Preliminary result)   Collection Time: 07/11/21  9:30 PM   Specimen: BLOOD  Result Value Ref Range Status   Specimen Description BLOOD BLOOD LEFT HAND  Final   Special Requests   Final    BOTTLES DRAWN AEROBIC AND ANAEROBIC Blood Culture adequate volume Performed at [facility], [address] [place], [place] [address]    Culture PENDING  Incomplete   Report Status PENDING  Incomplete  MRSA Next Gen by PCR, Nasal     Status: None   Collection Time: 07/12/21  5:00 AM  Result Value Ref Range Status   MRSA by PCR Next Gen NOT DETECTED NOT DETECTED Final    Comment: (NOTE) The GeneXpert MRSA Assay (FDA approved for NASAL specimens only), is one component of a comprehensive MRSA colonization surveillance program. It is not intended  to diagnose MRSA infection nor to guide or monitor treatment for MRSA infections. Test performance is not FDA approved in patients less than 18 years old. Performed at [facility], [address] [place],  [address]      Scheduled Meds:  amLODipine  10 mg Oral Daily   calcium carbonate  1 tablet Oral Q breakfast   Chlorhexidine Gluconate Cloth  6 each Topical Daily   dextromethorphan-guaiFENesin  1 tablet Oral BID   levalbuterol  0.63 mg Nebulization Q6H   lidocaine  1 patch Transdermal Q24H   methylPREDNISolone (SOLU-MEDROL) injection  40 mg Intravenous Q12H   nicotine  21 mg Transdermal Daily   Continuous Infusions:  azithromycin     cefTRIAXone (ROCEPHIN)  IV      Procedures/Studies: DG Chest 2 View  Result Date: 07/11/2021 CLINICAL DATA:  Shortness of breath and oxygen  desaturation. EXAM: CHEST - 2 VIEW COMPARISON:  PA Lat 02/16/2019. FINDINGS: The cardiac size is normal. Central vessels are normal caliber. There is interval low inspiration and development of a moderate layering left pleural effusion with overlying atelectasis or consolidation of the left lower lung field. The hypoexpanded right lung is clear. No pneumothorax is seen. Thoracic cage is intact. IMPRESSION: New finding of moderate left pleural effusion with overlying atelectasis or consolidation in left lower lung field. Clinical correlation and follow-up recommended. Electronically Signed   By: Telford Nab M.D.   On: 07/11/2021 22:04   CT Angio Chest PE W and/or Wo Contrast  Result Date: 07/11/2021 CLINICAL DATA:  Shortness of breath x2 days. EXAM: CT ANGIOGRAPHY CHEST WITH CONTRAST TECHNIQUE: Multidetector CT imaging of the chest was performed using the standard protocol during bolus administration of intravenous contrast. Multiplanar CT image reconstructions and MIPs were obtained to evaluate the vascular anatomy. CONTRAST:  147mL OMNIPAQUE IOHEXOL 350 MG/ML SOLN COMPARISON:  None. FINDINGS:  Cardiovascular: The subsegmental pulmonary arteries are limited in evaluation secondary to suboptimal opacification with intravenous contrast as well as areas of overlying artifact. No evidence of pulmonary embolism. Normal heart size with mild coronary artery calcification. No pericardial effusion. Mediastinum/Nodes: No enlarged mediastinal, hilar, or axillary lymph nodes. Thyroid gland, trachea, and esophagus demonstrate no significant findings. Lungs/Pleura: Moderate to marked severity atelectasis and/or infiltrate is seen within the left lower lobe and along the periphery of the left upper lobe. Mild posterior right basilar atelectasis and/or infiltrate is also seen. A small to moderate size left-sided pleural effusion is seen. No pneumothorax is identified. Upper Abdomen: No acute abnormality. Musculoskeletal: No chest wall abnormality. No acute or significant osseous findings. Review of the MIP images confirms the above findings. IMPRESSION: 1. Moderate to marked severity left lower lobe and left upper lobe atelectasis and/or infiltrate. 2. Mild posterior right basilar atelectasis and/or infiltrate. 3. Small to moderate size left-sided pleural effusion. 4. Limited evaluation of the pulmonary arteries, as described above, without definite evidence of pulmonary embolism. Electronically Signed   By: Virgina Norfolk M.D.   On: 07/11/2021 23:14    Orson Eva, DO  [facility]  If 7PM-7AM, please contact night-coverage [email or web address] Password [email or web address] 07/12/2021, 7:26 AM   LOS: 0 days

## 2021-07-12 NOTE — Progress Notes (Signed)
EKG done twice due to not crossing over epic and given to Dr Tat.

## 2021-07-13 ENCOUNTER — Inpatient Hospital Stay (HOSPITAL_COMMUNITY): Payer: Medicaid Other

## 2021-07-13 DIAGNOSIS — R091 Pleurisy: Secondary | ICD-10-CM

## 2021-07-13 DIAGNOSIS — J189 Pneumonia, unspecified organism: Secondary | ICD-10-CM | POA: Diagnosis not present

## 2021-07-13 DIAGNOSIS — I10 Essential (primary) hypertension: Secondary | ICD-10-CM | POA: Diagnosis not present

## 2021-07-13 DIAGNOSIS — E876 Hypokalemia: Secondary | ICD-10-CM | POA: Diagnosis not present

## 2021-07-13 DIAGNOSIS — J181 Lobar pneumonia, unspecified organism: Secondary | ICD-10-CM | POA: Diagnosis not present

## 2021-07-13 DIAGNOSIS — J9601 Acute respiratory failure with hypoxia: Secondary | ICD-10-CM | POA: Diagnosis not present

## 2021-07-13 LAB — BASIC METABOLIC PANEL
Anion gap: 9 (ref 5–15)
BUN: 11 mg/dL (ref 6–20)
CO2: 25 mmol/L (ref 22–32)
Calcium: 8.4 mg/dL — ABNORMAL LOW (ref 8.9–10.3)
Chloride: 101 mmol/L (ref 98–111)
Creatinine, Ser: 0.7 mg/dL (ref 0.61–1.24)
GFR, Estimated: >60 mL/min (ref >60)
Glucose, Bld: 161 mg/dL — ABNORMAL HIGH (ref 70–99)
Potassium: 3.8 mmol/L (ref 3.5–5.1)
Sodium: 135 mmol/L (ref 135–145)

## 2021-07-13 LAB — CBC
HCT: 40.6 % (ref 39.0–52.0)
Hemoglobin: 13.5 g/dL (ref 13.0–17.0)
MCH: 30.1 pg (ref 26.0–34.0)
MCHC: 33.3 g/dL (ref 30.0–36.0)
MCV: 90.6 fL (ref 80.0–100.0)
Platelets: 372 10*3/uL (ref 150–400)
RBC: 4.48 MIL/uL (ref 4.22–5.81)
RDW: 13.6 % (ref 11.5–15.5)
WBC: 21.9 10*3/uL — ABNORMAL HIGH (ref 4.0–10.5)
nRBC: 0 % (ref 0.0–0.2)

## 2021-07-13 LAB — LEGIONELLA PNEUMOPHILA SEROGP 1 UR AG: L. pneumophila Serogp 1 Ur Ag: NEGATIVE

## 2021-07-13 LAB — MAGNESIUM: Magnesium: 2 mg/dL (ref 1.7–2.4)

## 2021-07-13 LAB — PROCALCITONIN: Procalcitonin: 3.14 ng/mL

## 2021-07-13 NOTE — Consult Note (Signed)
NAME:  Victor [name], MRN:  [id number], DOB:  August 24, 1976, LOS: 1 ADMISSION DATE:  07/11/2021, CONSULTATION DATE:  07/12/2021 REFERRING MD:  Dr. Carles Collet, [facility], CHIEF COMPLAINT:  Short of breath   History of Present Illness:  45 yo male smoker developed pain in his left chest on 07/06/21.  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  He then developed cough with rust colored sputum, chills, sweats, and shakes.  Appetite has decreased.  Has pleuritic pain on left side causing splinting with respiration.  In ER he had SpO2 86% on room air.  CT chest showed LLL infiltrate and small effusion.  He was started on antibiotics, supplemental oxygen and solumedrol.  PCCM asked to assist with management in ICU.  Pertinent  Medical History  GERD, HTN, Stomach ulcer  Significant Hospital Events: Including procedures, antibiotic start and stop dates in addition to other pertinent events   11/16 admit 11/17 add solumedrol for pleurisy  Studies:  CT angio chest 07/11/21 >> LLL infiltrate, small Lt pleural effusion U/S chest 07/12/21 >> small, complex effusion  Interim History / Subjective:  Pain decreased on Lt side.  Moving better.  Still needing 11 liters oxygen . Cough decreased.  Slept okay.  Objective   Blood pressure (!) 154/92, pulse 97, temperature 98.1 F (36.7 C), temperature source Oral, resp. rate 17, height 5\' 11"  (1.803 m), weight 113.2 kg, SpO2 97 %.        Intake/Output Summary (Last 24 hours) at 07/13/2021 0727 Last data filed at 07/13/2021 4098 Gross per 24 hour  Intake 2536.73 ml  Output 675 ml  Net 1861.73 ml   Filed Weights   07/11/21 2058 07/12/21 0515  Weight: 113.9 kg 113.2 kg    Examination:  General - alert Eyes - pupils reactive ENT - no sinus tenderness, no stridor Cardiac - regular rate/rhythm, no murmur Chest - decreased BS on Lt base Abdomen - soft, non tender, + bowel sounds Extremities - no cyanosis, clubbing, or  edema Skin - no rashes Neuro - normal strength, moves extremities, follows commands Psych - normal mood and behavior  Bedside u/s 07/13/21 - small left effusion, dense consolidation of Lt lower lung  Resolved Hospital Problem list     Assessment & Plan:   Bacterial community acquired pneumonia with lobar consolidation of LLL. - day 3 of rocephin and zithromax - f/u legionella Ag from 11/17 - f/u blood culture from 11/16  Lt pleuritic chest pain. - reviewed bedside u/s on 07/13/21, and only small effusion - continue solumedrol, analgesics - f/u CXR 11/19  Acute hypoxic respiratory failure. - goal SpO2 > 92%  Steroid induced hyperglycemia. - f/u blood sugar on BMET  Leukocytosis. - from infection and steroids - f/u CBC  Updated family at bedside.  PCCM will f/u on Monday 07/16/21.  Call [phone] over weekend if assistance is needed.  Best Practice (right click and "Reselect all SmartList Selections" daily)   Diet/type: Regular consistency (see orders) DVT prophylaxis: LMWH GI prophylaxis: N/A Lines: N/A Foley:  N/A Code Status:  full code Last date of multidisciplinary goals of care discussion [x]   Labs    CMP Latest Ref Rng & Units 07/13/2021 07/12/2021 07/11/2021  Glucose 70 - 99 mg/dL 161(H) 158(H) 129(H)  BUN 6 - 20 mg/dL 11 12 14   Creatinine 0.61 - 1.24 mg/dL 0.70 0.90 0.90  Sodium 135 - 145 mmol/L 135 134(L) 135  Potassium 3.5 - 5.1 mmol/L 3.8 3.4(L) 3.2(L)  Chloride 98 - 111 mmol/L 101 100 97(L)  CO2 22 - 32 mmol/L 25 25 26   Calcium 8.9 - 10.3 mg/dL 8.4(L) 8.1(L) 8.8(L)  Total Protein 6.5 - 8.1 g/dL - 7.0 8.2(H)  Total Bilirubin 0.3 - 1.2 mg/dL - 0.7 0.6  Alkaline Phos 38 - 126 U/L - 98 101  AST 15 - 41 U/L - 26 22  ALT 0 - 44 U/L - 28 23    CBC Latest Ref Rng & Units 07/13/2021 07/12/2021 07/11/2021  WBC 4.0 - 10.5 K/uL 21.9(H) 19.3(H) 15.8(H)  Hemoglobin 13.0 - 17.0 g/dL 13.5 13.8 14.6  Hematocrit 39.0 - 52.0 % 40.6 42.2 43.3  Platelets  150 - 400 K/uL 372 383 388    Signature:  Chesley Mires, MD [place] Pager - [phone] 07/13/2021, 7:27 AM

## 2021-07-13 NOTE — Progress Notes (Signed)
PROGRESS NOTE  Victor [name] FYB:[id number] DOB: March 03, 1976 DOA: 07/11/2021 PCP: [name], DO   Brief History:  45 year old male with a history of hypertension, hyperlipidemia, tobacco abuse, GERD, chronic knee pain status post right TKA and multiple revisions presenting with shortness of breath and left-sided chest pain.  The patient states that his shortness of breath began on the evening of 07/05/2021.  He has had a nonproductive cough.  He denies any fevers, chills, headache, neck pain, nausea, vomiting, diarrhea, abdominal pain.  However he has had some sharp pleuritic type left-sided chest pain rating down to his left flank and left upper quadrant.  He has not had any diarrhea, hematochezia, melena.  He denies any sick contacts.  He denies any new medications.  He continues to smoke 1 pack a day.  Patient went to urgent care on the morning of 07/11/2021.  However, he had worsening shortness of breath when he got home resulting in activation of EMS.  He was not able to pick up his prescriptions prescribed at urgent care including antibiotics and the steroids. The emergency department, the patient was afebrile but tachycardic up to 110 with oxygen saturation 90-94% on 6 L.  He was started on Rocephin and azithromycin.  CTA chest was negative for PE but showed moderate to severe atelectasis/infiltrate of the left lower lobe and left upper lobe with small to moderate pleural effusion.   Assessment/Plan: Sepsis -Present on admission -Presented with leukocytosis, tachycardia, tachypnea into the 30s -Secondary to pneumonia -Lactic acid peaked 1.6 -PCT 4.98>>3.14 -Continue ceftriaxone and azithromycin   Acute respiratory failure with hypoxia -Presented with saturation 86% on room air and tachypnea to the 30s -Secondary to pneumonia and left pleural effusion -Currently on 9 L high flow>>13L HFNC -Wean oxygen as tolerated for saturation greater 90% -consulted and discussed  with pulmonary, Dr. Halford Chessman   Left pleural effusion/pleuritic CP -Request thoracocentesis--not enough fluid to perform -multiple bedside US performed by Dr. Fanny Dance significant fluid -continue steroids -continue IV dilaudid>>1 mg IV q 2 prn -11/18--personally reviewed CXR--progressive opacification of Left hemithorax   Lobar pneumonia -Continue ceftriaxone and azithromycin -Continue Xopenex and Atrovent -continue pulmicort and arformoterol   Hypertension -restarted amlodipine -BP remains elevated in part due to pain   Tobacco abuse -Tobacco cessation discussed -He has a 30-pack-year history   Chronic Right knee pain -PDMP reviewed -he receives monthly percocet 7.5 and pregabalin   Hypokalemia -replete -check mag--2.0   Abdominal pain -CT abd/pelvis--no acute intraabdominal findings           Status is: Inpatient   Remains inpatient appropriate because: severity of illness requiring high flow oxygen, IV antibiotics               Family Communication:   spouse updated at bedside 11/18   Consultants:  none   Code Status:  FULL    DVT Prophylaxis:  [place] Lovenox     Procedures: As Listed in Progress Note Above   Antibiotics: Ceftriaxone 11/16>> Azithro 11/16>>      Subjective: Patient states left pleuritic pain is better controlled but has pain with any cough or deep inspiration.  Remains sob with any minimal exertion.  Denies f/c, headache, n/v/d, abd pain  Objective: Vitals:   07/13/21 0812 07/13/21 1100 07/13/21 1152 07/13/21 1200  BP:  (!) 151/82    Pulse: (!) 107 (!) 119 (!) 110 (!) 108  Resp: (!) 31 20 (!) 22 (!) 28  Temp:   98.4 F (36.9 C)   TempSrc:   Oral   SpO2: 93% (!) 87% (!) 89% (!) 89%  Weight:      Height:        Intake/Output Summary (Last 24 hours) at 07/13/2021 1348 Last data filed at 07/13/2021 0605 Gross per 24 hour  Intake 2296.73 ml  Output 400 ml  Net 1896.73 ml   Weight change:  Exam:  General:  Pt is alert,  follows commands appropriately, not in acute distress HEENT: No icterus, No thrush, No neck mass, [place]/AT Cardiovascular: RRR, S1/S2, no rubs, no gallops Respiratory: diminished BS on left.  R- basilar crackles.  No wheeze Abdomen: Soft/+BS, non tender, non distended, no guarding Extremities: No edema, No lymphangitis, No petechiae, No rashes, no synovitis   Data Reviewed: I have personally reviewed following labs and imaging studies Basic Metabolic Panel: Recent Labs  Lab 07/11/21 2130 07/12/21 0331 07/13/21 0447  NA 135 134* 135  K 3.2* 3.4* 3.8  CL 97* 100 101  CO2 26 25 25   GLUCOSE 129* 158* 161*  BUN 14 12 11   CREATININE 0.90 0.90 0.70  CALCIUM 8.8* 8.1* 8.4*  MG  --  1.7 2.0  PHOS  --  3.9  --    Liver Function Tests: Recent Labs  Lab 07/11/21 2130 07/12/21 0331  AST 22 26  ALT 23 28  ALKPHOS 101 98  BILITOT 0.6 0.7  PROT 8.2* 7.0  ALBUMIN 3.8 3.2*   No results for input(s): LIPASE, AMYLASE in the last 168 hours. No results for input(s): AMMONIA in the last 168 hours. Coagulation Profile: Recent Labs  Lab 07/11/21 2130  INR 1.0   CBC: Recent Labs  Lab 07/11/21 2130 07/12/21 0443 07/13/21 0447  WBC 15.8* 19.3* 21.9*  NEUTROABS 13.5*  --   --   HGB 14.6 13.8 13.5  HCT 43.3 42.2 40.6  MCV 88.9 91.5 90.6  PLT 388 383 372   Cardiac Enzymes: No results for input(s): CKTOTAL, CKMB, CKMBINDEX, TROPONINI in the last 168 hours. BNP: Invalid input(s): POCBNP CBG: No results for input(s): GLUCAP in the last 168 hours. HbA1C: No results for input(s): HGBA1C in the last 72 hours. Urine analysis:    Component Value Date/Time   COLORURINE YELLOW 07/11/2021 2245   APPEARANCEUR CLEAR 07/11/2021 2245   LABSPEC 1.021 07/11/2021 2245   PHURINE 5.0 07/11/2021 2245   GLUCOSEU NEGATIVE 07/11/2021 2245   HGBUR MODERATE (A) 07/11/2021 2245   BILIRUBINUR NEGATIVE 07/11/2021 2245   KETONESUR NEGATIVE 07/11/2021 2245   PROTEINUR 30 (A) 07/11/2021 2245   NITRITE  NEGATIVE 07/11/2021 2245   LEUKOCYTESUR NEGATIVE 07/11/2021 2245   Sepsis Labs: @LABRCNTIP (procalcitonin:4,lacticidven:4) ) Recent Results (from the past 240 hour(s))  Resp Panel by RT-PCR (Flu A&B, Covid) Nasopharyngeal Swab     Status: None   Collection Time: 07/11/21  9:05 PM   Specimen: Nasopharyngeal Swab; Nasopharyngeal(NP) swabs in vial transport medium  Result Value Ref Range Status   SARS Coronavirus 2 by RT PCR NEGATIVE NEGATIVE Final    Comment: (NOTE) SARS-CoV-2 target nucleic acids are NOT DETECTED.  The SARS-CoV-2 RNA is generally detectable in upper respiratory specimens during the acute phase of infection. The lowest concentration of SARS-CoV-2 viral copies this assay can detect is 138 copies/mL. A negative result does not preclude SARS-Cov-2 infection and should not be used as the sole basis for treatment or other patient management decisions. A negative result Lightsey occur with  improper specimen collection/handling, submission of specimen  other than nasopharyngeal swab, presence of viral mutation(s) within the areas targeted by this assay, and inadequate number of viral copies(<138 copies/mL). A negative result must be combined with clinical observations, patient history, and epidemiological information. The expected result is Negative.  Fact Sheet for Patients:  EntrepreneurPulse.com.au  Fact Sheet for Healthcare Providers:  IncredibleEmployment.be  This test is no t yet approved or cleared by the [place] FDA and  has been authorized for detection and/or diagnosis of SARS-CoV-2 by FDA under an Emergency Use Authorization (EUA). This EUA will remain  in effect (meaning this test can be used) for the duration of the COVID-19 declaration under Section 564(b)(1) of the Act, 21 U.S.C.section 360bbb-3(b)(1), unless the authorization is terminated  or revoked sooner.       Influenza A by PCR NEGATIVE NEGATIVE Final    Influenza B by PCR NEGATIVE NEGATIVE Final    Comment: (NOTE) The Xpert Xpress SARS-CoV-2/FLU/RSV plus assay is intended as an aid in the diagnosis of influenza from Nasopharyngeal swab specimens and should not be used as a sole basis for treatment. Nasal washings and aspirates are unacceptable for Xpert Xpress SARS-CoV-2/FLU/RSV testing.  Fact Sheet for Patients: EntrepreneurPulse.com.au  Fact Sheet for Healthcare Providers: IncredibleEmployment.be  This test is not yet approved or cleared by the [place] FDA and has been authorized for detection and/or diagnosis of SARS-CoV-2 by FDA under an Emergency Use Authorization (EUA). This EUA will remain in effect (meaning this test can be used) for the duration of the COVID-19 declaration under Section 564(b)(1) of the Act, 21 U.S.C. section 360bbb-3(b)(1), unless the authorization is terminated or revoked.  Performed at [facility], [address] [place], [place] [address]   Culture, blood (Routine x 2)     Status: None (Preliminary result)   Collection Time: 07/11/21  9:30 PM   Specimen: BLOOD  Result Value Ref Range Status   Specimen Description BLOOD LEFT ANTECUBITAL  Final   Special Requests   Final    BOTTLES DRAWN AEROBIC AND ANAEROBIC Blood Culture adequate volume   Culture   Final    NO GROWTH < 12 HOURS Performed at [facility], [address], [place], [place] [address]    Report Status PENDING  Incomplete  Culture, blood (Routine x 2)     Status: None (Preliminary result)   Collection Time: 07/11/21  9:30 PM   Specimen: BLOOD  Result Value Ref Range Status   Specimen Description BLOOD BLOOD LEFT HAND  Final   Special Requests   Final    BOTTLES DRAWN AEROBIC AND ANAEROBIC Blood Culture adequate volume   Culture   Final    NO GROWTH < 12 HOURS Performed at [facility], [address] [place], [place] [address]    Report Status PENDING  Incomplete  MRSA Next Gen by PCR,  Nasal     Status: None   Collection Time: 07/12/21  5:00 AM  Result Value Ref Range Status   MRSA by PCR Next Gen NOT DETECTED NOT DETECTED Final    Comment: (NOTE) The GeneXpert MRSA Assay (FDA approved for NASAL specimens only), is one component of a comprehensive MRSA colonization surveillance program. It is not intended to diagnose MRSA infection nor to guide or monitor treatment for MRSA infections. Test performance is not FDA approved in patients less than 35 years old. Performed at [facility], [address] [place], [place] [address]      Scheduled Meds:  amLODipine  5 mg Oral Daily  arformoterol  15 mcg Nebulization BID   budesonide (PULMICORT) nebulizer solution  0.5 mg Nebulization BID   calcium carbonate  1 tablet Oral Q breakfast   Chlorhexidine Gluconate Cloth  6 each Topical Daily   dextromethorphan-guaiFENesin  1 tablet Oral BID   docusate sodium  100 mg Oral BID   enoxaparin (LOVENOX) injection  60 mg Subcutaneous Daily   ipratropium  0.5 mg Nebulization Q6H   levalbuterol  0.63 mg Nebulization Q6H   lidocaine  1 patch Transdermal Q24H   methylPREDNISolone (SOLU-MEDROL) injection  60 mg Intravenous Q12H   nicotine  21 mg Transdermal Daily   Continuous Infusions:  azithromycin Stopped (07/12/21 2318)   cefTRIAXone (ROCEPHIN)  IV Stopped (07/12/21 2119)    Procedures/Studies: CT ABDOMEN PELVIS WO CONTRAST  Result Date: 07/12/2021 CLINICAL DATA:  Abdominal pain and sepsis. Left-sided abdominal pain since last evening. History of prior appendectomy. EXAM: CT ABDOMEN AND PELVIS WITHOUT CONTRAST TECHNIQUE: Multidetector CT imaging of the abdomen and pelvis was performed following the standard protocol without IV contrast. COMPARISON:  Chest CT from yesterday. FINDINGS: Lower chest: Progressive dense left lower lobe airspace opacification most likely lobar pneumonia with an associated moderate-sized parapneumonic effusion, increased since yesterday's study.  Progressive right basilar atelectasis. Hepatobiliary: No hepatic lesions or intrahepatic biliary dilatation. High attenuation material in the gallbladder likely vicarious excretion from the chest CT from yesterday. There appear to be some filling defects also suggesting gallstones. No CT findings to suggest acute cholecystitis. No common bile duct dilatation. Pancreas: No mass, inflammation or ductal dilatation. Spleen: Normal size.  No focal lesions. Adrenals/Urinary Tract: Adrenal glands and kidneys are grossly normal. No renal or obstructing ureteral calculi. No bladder calculi. No mass lesions. The bladder contains high attenuation material likely excreted contrast material from yesterday CT scan. Stomach/Bowel: The stomach, duodenum, small bowel and colon are unremarkable. No acute inflammatory changes, mass lesions or obstructive findings. Vascular/Lymphatic: The aorta is normal in caliber. Minimal scattered atheroscerlotic calcifications. No mesenteric of retroperitoneal mass or adenopathy. Small scattered lymph nodes are noted. Reproductive: The prostate gland and seminal vesicles are unremarkable. Other: Small periumbilical abdominal wall hernia containing fat. No free air or free fluid in the abdomen/pelvis. Musculoskeletal: No significant bony findings. IMPRESSION: 1. Progressive dense left lower lobe airspace opacification most likely lobar pneumonia with an associated moderate-sized parapneumonic effusion, increased since yesterday's study. 2. Progressive right basilar atelectasis. 3. No acute abdominal/pelvic findings, mass lesions or adenopathy. 4. Cholelithiasis. Electronically Signed   By: Marijo Sanes M.D.   On: 07/12/2021 14:48   DG Chest 2 View  Result Date: 07/11/2021 CLINICAL DATA:  Shortness of breath and oxygen desaturation. EXAM: CHEST - 2 VIEW COMPARISON:  PA Lat 02/16/2019. FINDINGS: The cardiac size is normal. Central vessels are normal caliber. There is interval low inspiration and  development of a moderate layering left pleural effusion with overlying atelectasis or consolidation of the left lower lung field. The hypoexpanded right lung is clear. No pneumothorax is seen. Thoracic cage is intact. IMPRESSION: New finding of moderate left pleural effusion with overlying atelectasis or consolidation in left lower lung field. Clinical correlation and follow-up recommended. Electronically Signed   By: Telford Nab M.D.   On: 07/11/2021 22:04   CT Angio Chest PE W and/or Wo Contrast  Result Date: 07/11/2021 CLINICAL DATA:  Shortness of breath x2 days. EXAM: CT ANGIOGRAPHY CHEST WITH CONTRAST TECHNIQUE: Multidetector CT imaging of the chest was performed using the standard protocol during bolus administration of intravenous contrast. Multiplanar  CT image reconstructions and MIPs were obtained to evaluate the vascular anatomy. CONTRAST:  180mL OMNIPAQUE IOHEXOL 350 MG/ML SOLN COMPARISON:  None. FINDINGS: Cardiovascular: The subsegmental pulmonary arteries are limited in evaluation secondary to suboptimal opacification with intravenous contrast as well as areas of overlying artifact. No evidence of pulmonary embolism. Normal heart size with mild coronary artery calcification. No pericardial effusion. Mediastinum/Nodes: No enlarged mediastinal, hilar, or axillary lymph nodes. Thyroid gland, trachea, and esophagus demonstrate no significant findings. Lungs/Pleura: Moderate to marked severity atelectasis and/or infiltrate is seen within the left lower lobe and along the periphery of the left upper lobe. Mild posterior right basilar atelectasis and/or infiltrate is also seen. A small to moderate size left-sided pleural effusion is seen. No pneumothorax is identified. Upper Abdomen: No acute abnormality. Musculoskeletal: No chest wall abnormality. No acute or significant osseous findings. Review of the MIP images confirms the above findings. IMPRESSION: 1. Moderate to marked severity left lower lobe  and left upper lobe atelectasis and/or infiltrate. 2. Mild posterior right basilar atelectasis and/or infiltrate. 3. Small to moderate size left-sided pleural effusion. 4. Limited evaluation of the pulmonary arteries, as described above, without definite evidence of pulmonary embolism. Electronically Signed   By: Virgina Norfolk M.D.   On: 07/11/2021 23:14   [place] CHEST (PLEURAL EFFUSION)  Result Date: 07/12/2021 CLINICAL DATA:  Left pleural effusion. EXAM: CHEST ULTRASOUND COMPARISON:  July 11, 2021. FINDINGS: Sonographic evaluation of the left hemithorax demonstrates small and somewhat complex appearing left pleural effusion. Adequate fluid pocket for thoracentesis is not visualized. IMPRESSION: Small and somewhat complex appearing left pleural effusion is noted. Electronically Signed   By: Marijo Conception M.D.   On: 07/12/2021 10:38   DG Chest Port 1 View  Result Date: 07/13/2021 CLINICAL DATA:  Shortness of breath, left pleural effusion EXAM: PORTABLE CHEST 1 VIEW COMPARISON:  Previous studies including the examination of 07/12/2021 FINDINGS: There is further increase in size of left pleural effusion with almost complete opacification. There is decreased aeration in the left lung with small area of aeration in the left upper lung fields. Increased density in the right lower lung fields Duzan be due to pleural effusion and possibly underlying atelectasis. There is no pneumothorax. IMPRESSION: There is increase in amount of large left pleural effusion with almost complete atelectasis of left lung with small residual area of aeration in the left upper lung fields. Increased density in the right lower lung fields suggests small effusion and possibly underlying atelectasis with slight improvement. Electronically Signed   By: Elmer Picker M.D.   On: 07/13/2021 08:11   DG CHEST PORT 1 VIEW  Result Date: 07/12/2021 CLINICAL DATA:  Increased shortness of breath EXAM: PORTABLE CHEST 1 VIEW  COMPARISON:  Chest x-ray dated July 11, 2021 FINDINGS: Visualized cardiac and mediastinal contours are unchanged. Small right and large left pleural effusions with associated atelectasis, increased compared to prior exam. New rightward deviation of the trachea. No evidence of pneumothorax. IMPRESSION: 1. Small right and large left pleural effusions with associated atelectasis, increased compared to prior exam. 2. New rightward deviation of the trachea, likely related to large left pleural effusion. Electronically Signed   By: Yetta Glassman M.D.   On: 07/12/2021 14:05    Orson Eva, DO  [facility]  If 7PM-7AM, please contact night-coverage [email or web address] Password [email or web address] 07/13/2021, 1:48 PM   LOS: 1 day

## 2021-07-14 ENCOUNTER — Inpatient Hospital Stay (HOSPITAL_COMMUNITY): Payer: Medicaid Other

## 2021-07-14 DIAGNOSIS — J181 Lobar pneumonia, unspecified organism: Secondary | ICD-10-CM | POA: Diagnosis not present

## 2021-07-14 DIAGNOSIS — I10 Essential (primary) hypertension: Secondary | ICD-10-CM | POA: Diagnosis not present

## 2021-07-14 DIAGNOSIS — J9601 Acute respiratory failure with hypoxia: Secondary | ICD-10-CM | POA: Diagnosis not present

## 2021-07-14 DIAGNOSIS — J189 Pneumonia, unspecified organism: Secondary | ICD-10-CM | POA: Diagnosis not present

## 2021-07-14 LAB — BASIC METABOLIC PANEL
Anion gap: 9 (ref 5–15)
BUN: 15 mg/dL (ref 6–20)
CO2: 26 mmol/L (ref 22–32)
Calcium: 8.5 mg/dL — ABNORMAL LOW (ref 8.9–10.3)
Chloride: 101 mmol/L (ref 98–111)
Creatinine, Ser: 0.78 mg/dL (ref 0.61–1.24)
GFR, Estimated: >60 mL/min (ref >60)
Glucose, Bld: 149 mg/dL — ABNORMAL HIGH (ref 70–99)
Potassium: 3.7 mmol/L (ref 3.5–5.1)
Sodium: 136 mmol/L (ref 135–145)

## 2021-07-14 LAB — CBC
HCT: 40 % (ref 39.0–52.0)
Hemoglobin: 13 g/dL (ref 13.0–17.0)
MCH: 29.6 pg (ref 26.0–34.0)
MCHC: 32.5 g/dL (ref 30.0–36.0)
MCV: 91.1 fL (ref 80.0–100.0)
Platelets: 392 10*3/uL (ref 150–400)
RBC: 4.39 MIL/uL (ref 4.22–5.81)
RDW: 13.8 % (ref 11.5–15.5)
WBC: 25 10*3/uL — ABNORMAL HIGH (ref 4.0–10.5)
nRBC: 0 % (ref 0.0–0.2)

## 2021-07-14 LAB — PROCALCITONIN: Procalcitonin: 1.6 ng/mL

## 2021-07-14 MED ORDER — IPRATROPIUM BROMIDE 0.02 % IN SOLN
0.5000 mg | Freq: Two times a day (BID) | RESPIRATORY_TRACT | Status: DC
  Administered 2021-07-14 – 2021-07-22 (×17): 0.5 mg via RESPIRATORY_TRACT
  Filled 2021-07-14 (×18): qty 2.5

## 2021-07-14 MED ORDER — LEVALBUTEROL HCL 0.63 MG/3ML IN NEBU
0.6300 mg | INHALATION_SOLUTION | Freq: Two times a day (BID) | RESPIRATORY_TRACT | Status: DC
  Administered 2021-07-14 – 2021-07-22 (×16): 0.63 mg via RESPIRATORY_TRACT
  Filled 2021-07-14 (×19): qty 3

## 2021-07-14 NOTE — Progress Notes (Signed)
PROGRESS NOTE  Victor [name] HFW:[id number] DOB: 05-09-1976 DOA: 07/11/2021 PCP: [name], DO   Brief History:  45 year old male with a history of hypertension, hyperlipidemia, tobacco abuse, GERD, chronic knee pain status post right TKA and multiple revisions presenting with shortness of breath and left-sided chest pain.  The patient states that his shortness of breath began on the evening of 07/05/2021.  He has had a nonproductive cough.  He denies any fevers, chills, headache, neck pain, nausea, vomiting, diarrhea, abdominal pain.  However he has had some sharp pleuritic type left-sided chest pain rating down to his left flank and left upper quadrant.  He has not had any diarrhea, hematochezia, melena.  He denies any sick contacts.  He denies any new medications.  He continues to smoke 1 pack a day.  Patient went to urgent care on the morning of 07/11/2021.  However, he had worsening shortness of breath when he got home resulting in activation of EMS.  He was not able to pick up his prescriptions prescribed at urgent care including antibiotics and the steroids. The emergency department, the patient was afebrile but tachycardic up to 110 with oxygen saturation 90-94% on 6 L.  He was started on Rocephin and azithromycin.  CTA chest was negative for PE but showed moderate to severe atelectasis/infiltrate of the left lower lobe and left upper lobe with small to moderate pleural effusion.   Assessment/Plan: Sepsis -Present on admission -Presented with leukocytosis, tachycardia, tachypnea into the 30s -Secondary to pneumonia -Lactic acid peaked 1.6 -PCT 4.98>>3.14 -Continue ceftriaxone and azithromycin   Acute respiratory failure with hypoxia -Presented with saturation 86% on room air and tachypnea to the 30s -Secondary to pneumonia and left pleural effusion -Currently on 10L HFNC -Wean oxygen as tolerated for saturation greater 90% -consulted and discussed with pulmonary,  Dr. Halford Chessman   Left pleural effusion/pleuritic CP -Request thoracocentesis--not enough fluid to perform -multiple bedside US performed by Dr. Fanny Dance significant fluid -continue steroids -continue IV dilaudid>>1 mg IV q 2 prn -11/18--personally reviewed CXR--progressive opacification of Left hemithorax    Lobar pneumonia -Continue ceftriaxone and azithromycin -Continue Xopenex and Atrovent -continue pulmicort and arformoterol -Urinary antigens for pneumococcus and Legionella are negative -We will reorder sputum culture -WBC count is trending up, although he is also receiving steroids   Hypertension -restarted amlodipine -BP remains elevated in part due to pain   Tobacco abuse -Tobacco cessation discussed -He has a 30-pack-year history   Chronic Right knee pain -PDMP reviewed -he receives monthly percocet 7.5 and pregabalin   Hypokalemia -replete -check mag--2.0   Abdominal pain -CT abd/pelvis--no acute intraabdominal findings           Status is: Inpatient   Remains inpatient appropriate because: severity of illness requiring high flow oxygen, IV antibiotics               Family Communication:   spouse updated at bedside 11/18   Consultants:  none   Code Status:  FULL    DVT Prophylaxis:  [place] Lovenox     Procedures: As Listed in Progress Note Above   Antibiotics: Ceftriaxone 11/16>> Azithro 11/16>>      Subjective: Says he is starting to feel little better.  Oxygen is being weaned down.  Continues to have a productive cough, producing large amounts of sputum.  Feels her breathing is easier sitting up.  Objective: Vitals:   07/14/21 1710 07/14/21 1800 07/14/21 1900 07/14/21 1956  BP:  (!) 161/87    Pulse: 91 90 91   Resp: (!) 24 (!) 22 (!) 22   Temp:    98.1 F (36.7 C)  TempSrc:    Oral  SpO2: 93% 92% 94%   Weight:      Height:        Intake/Output Summary (Last 24 hours) at 07/14/2021 2031 Last data filed at 07/14/2021 3474 Gross  per 24 hour  Intake 350 ml  Output 400 ml  Net -50 ml   Weight change:  Exam:  General:  Pt is alert, follows commands appropriately, not in acute distress HEENT: No icterus, No thrush, No neck mass, [place]/AT Cardiovascular: RRR, S1/S2, no rubs, no gallops Respiratory: Diminished breath sounds on the left side.  No wheeze Abdomen: Soft/+BS, non tender, non distended, no guarding Extremities: No edema, No lymphangitis, No petechiae, No rashes, no synovitis   Data Reviewed: I have personally reviewed following labs and imaging studies Basic Metabolic Panel: Recent Labs  Lab 07/11/21 2130 07/12/21 0331 07/13/21 0447 07/14/21 0449  NA 135 134* 135 136  K 3.2* 3.4* 3.8 3.7  CL 97* 100 101 101  CO2 26 25 25 26   GLUCOSE 129* 158* 161* 149*  BUN 14 12 11 15   CREATININE 0.90 0.90 0.70 0.78  CALCIUM 8.8* 8.1* 8.4* 8.5*  MG  --  1.7 2.0  --   PHOS  --  3.9  --   --    Liver Function Tests: Recent Labs  Lab 07/11/21 2130 07/12/21 0331  AST 22 26  ALT 23 28  ALKPHOS 101 98  BILITOT 0.6 0.7  PROT 8.2* 7.0  ALBUMIN 3.8 3.2*   No results for input(s): LIPASE, AMYLASE in the last 168 hours. No results for input(s): AMMONIA in the last 168 hours. Coagulation Profile: Recent Labs  Lab 07/11/21 2130  INR 1.0   CBC: Recent Labs  Lab 07/11/21 2130 07/12/21 0443 07/13/21 0447 07/14/21 0449  WBC 15.8* 19.3* 21.9* 25.0*  NEUTROABS 13.5*  --   --   --   HGB 14.6 13.8 13.5 13.0  HCT 43.3 42.2 40.6 40.0  MCV 88.9 91.5 90.6 91.1  PLT 388 383 372 392   Cardiac Enzymes: No results for input(s): CKTOTAL, CKMB, CKMBINDEX, TROPONINI in the last 168 hours. BNP: Invalid input(s): POCBNP CBG: No results for input(s): GLUCAP in the last 168 hours. HbA1C: No results for input(s): HGBA1C in the last 72 hours. Urine analysis:    Component Value Date/Time   COLORURINE YELLOW 07/11/2021 2245   APPEARANCEUR CLEAR 07/11/2021 2245   LABSPEC 1.021 07/11/2021 2245   PHURINE 5.0  07/11/2021 2245   GLUCOSEU NEGATIVE 07/11/2021 2245   HGBUR MODERATE (A) 07/11/2021 2245   BILIRUBINUR NEGATIVE 07/11/2021 2245   KETONESUR NEGATIVE 07/11/2021 2245   PROTEINUR 30 (A) 07/11/2021 2245   NITRITE NEGATIVE 07/11/2021 2245   LEUKOCYTESUR NEGATIVE 07/11/2021 2245   Sepsis Labs: @LABRCNTIP (procalcitonin:4,lacticidven:4) ) Recent Results (from the past 240 hour(s))  Resp Panel by RT-PCR (Flu A&B, Covid) Nasopharyngeal Swab     Status: None   Collection Time: 07/11/21  9:05 PM   Specimen: Nasopharyngeal Swab; Nasopharyngeal(NP) swabs in vial transport medium  Result Value Ref Range Status   SARS Coronavirus 2 by RT PCR NEGATIVE NEGATIVE Final    Comment: (NOTE) SARS-CoV-2 target nucleic acids are NOT DETECTED.  The SARS-CoV-2 RNA is generally detectable in upper respiratory specimens during the acute phase of infection. The lowest concentration of SARS-CoV-2 viral copies  this assay can detect is 138 copies/mL. A negative result does not preclude SARS-Cov-2 infection and should not be used as the sole basis for treatment or other patient management decisions. A negative result Mcfann occur with  improper specimen collection/handling, submission of specimen other than nasopharyngeal swab, presence of viral mutation(s) within the areas targeted by this assay, and inadequate number of viral copies(<138 copies/mL). A negative result must be combined with clinical observations, patient history, and epidemiological information. The expected result is Negative.  Fact Sheet for Patients:  EntrepreneurPulse.com.au  Fact Sheet for Healthcare Providers:  IncredibleEmployment.be  This test is no t yet approved or cleared by the [place] FDA and  has been authorized for detection and/or diagnosis of SARS-CoV-2 by FDA under an Emergency Use Authorization (EUA). This EUA will remain  in effect (meaning this test can be used) for the duration of  the COVID-19 declaration under Section 564(b)(1) of the Act, 21 U.S.C.section 360bbb-3(b)(1), unless the authorization is terminated  or revoked sooner.       Influenza A by PCR NEGATIVE NEGATIVE Final   Influenza B by PCR NEGATIVE NEGATIVE Final    Comment: (NOTE) The Xpert Xpress SARS-CoV-2/FLU/RSV plus assay is intended as an aid in the diagnosis of influenza from Nasopharyngeal swab specimens and should not be used as a sole basis for treatment. Nasal washings and aspirates are unacceptable for Xpert Xpress SARS-CoV-2/FLU/RSV testing.  Fact Sheet for Patients: EntrepreneurPulse.com.au  Fact Sheet for Healthcare Providers: IncredibleEmployment.be  This test is not yet approved or cleared by the [place] FDA and has been authorized for detection and/or diagnosis of SARS-CoV-2 by FDA under an Emergency Use Authorization (EUA). This EUA will remain in effect (meaning this test can be used) for the duration of the COVID-19 declaration under Section 564(b)(1) of the Act, 21 U.S.C. section 360bbb-3(b)(1), unless the authorization is terminated or revoked.  Performed at [facility], [address], [place], [place] [address]   Culture, blood (Routine x 2)     Status: None (Preliminary result)   Collection Time: 07/11/21  9:30 PM   Specimen: BLOOD  Result Value Ref Range Status   Specimen Description BLOOD LEFT ANTECUBITAL  Final   Special Requests   Final    BOTTLES DRAWN AEROBIC AND ANAEROBIC Blood Culture adequate volume   Culture   Final    NO GROWTH 3 DAYS Performed at [facility], [address] [place], [place] [address]    Report Status PENDING  Incomplete  Culture, blood (Routine x 2)     Status: None (Preliminary result)   Collection Time: 07/11/21  9:30 PM   Specimen: BLOOD  Result Value Ref Range Status   Specimen Description BLOOD BLOOD LEFT HAND  Final   Special Requests   Final    BOTTLES DRAWN AEROBIC AND ANAEROBIC  Blood Culture adequate volume   Culture   Final    NO GROWTH 3 DAYS Performed at [facility], [address] [place], [place] [address]    Report Status PENDING  Incomplete  MRSA Next Gen by PCR, Nasal     Status: None   Collection Time: 07/12/21  5:00 AM  Result Value Ref Range Status   MRSA by PCR Next Gen NOT DETECTED NOT DETECTED Final    Comment: (NOTE) The GeneXpert MRSA Assay (FDA approved for NASAL specimens only), is one component of a comprehensive MRSA colonization surveillance program. It is not intended to diagnose MRSA infection nor to guide or monitor treatment  for MRSA infections. Test performance is not FDA approved in patients less than 47 years old. Performed at [facility], [address], [place], [place] [address]      Scheduled Meds:  amLODipine  5 mg Oral Daily   arformoterol  15 mcg Nebulization BID   budesonide (PULMICORT) nebulizer solution  0.5 mg Nebulization BID   calcium carbonate  1 tablet Oral Q breakfast   Chlorhexidine Gluconate Cloth  6 each Topical Daily   dextromethorphan-guaiFENesin  1 tablet Oral BID   docusate sodium  100 mg Oral BID   enoxaparin (LOVENOX) injection  60 mg Subcutaneous Daily   ipratropium  0.5 mg Nebulization BID   levalbuterol  0.63 mg Nebulization BID   lidocaine  1 patch Transdermal Q24H   methylPREDNISolone (SOLU-MEDROL) injection  60 mg Intravenous Q12H   nicotine  21 mg Transdermal Daily   Continuous Infusions:  azithromycin Stopped (07/13/21 2238)   cefTRIAXone (ROCEPHIN)  IV Stopped (07/13/21 2119)    Procedures/Studies: CT ABDOMEN PELVIS WO CONTRAST  Result Date: 07/12/2021 CLINICAL DATA:  Abdominal pain and sepsis. Left-sided abdominal pain since last evening. History of prior appendectomy. EXAM: CT ABDOMEN AND PELVIS WITHOUT CONTRAST TECHNIQUE: Multidetector CT imaging of the abdomen and pelvis was performed following the standard protocol without IV contrast. COMPARISON:  Chest CT from yesterday.  FINDINGS: Lower chest: Progressive dense left lower lobe airspace opacification most likely lobar pneumonia with an associated moderate-sized parapneumonic effusion, increased since yesterday's study. Progressive right basilar atelectasis. Hepatobiliary: No hepatic lesions or intrahepatic biliary dilatation. High attenuation material in the gallbladder likely vicarious excretion from the chest CT from yesterday. There appear to be some filling defects also suggesting gallstones. No CT findings to suggest acute cholecystitis. No common bile duct dilatation. Pancreas: No mass, inflammation or ductal dilatation. Spleen: Normal size.  No focal lesions. Adrenals/Urinary Tract: Adrenal glands and kidneys are grossly normal. No renal or obstructing ureteral calculi. No bladder calculi. No mass lesions. The bladder contains high attenuation material likely excreted contrast material from yesterday CT scan. Stomach/Bowel: The stomach, duodenum, small bowel and colon are unremarkable. No acute inflammatory changes, mass lesions or obstructive findings. Vascular/Lymphatic: The aorta is normal in caliber. Minimal scattered atheroscerlotic calcifications. No mesenteric of retroperitoneal mass or adenopathy. Small scattered lymph nodes are noted. Reproductive: The prostate gland and seminal vesicles are unremarkable. Other: Small periumbilical abdominal wall hernia containing fat. No free air or free fluid in the abdomen/pelvis. Musculoskeletal: No significant bony findings. IMPRESSION: 1. Progressive dense left lower lobe airspace opacification most likely lobar pneumonia with an associated moderate-sized parapneumonic effusion, increased since yesterday's study. 2. Progressive right basilar atelectasis. 3. No acute abdominal/pelvic findings, mass lesions or adenopathy. 4. Cholelithiasis. Electronically Signed   By: Marijo Sanes M.D.   On: 07/12/2021 14:48   DG Chest 2 View  Result Date: 07/11/2021 CLINICAL DATA:   Shortness of breath and oxygen desaturation. EXAM: CHEST - 2 VIEW COMPARISON:  PA Lat 02/16/2019. FINDINGS: The cardiac size is normal. Central vessels are normal caliber. There is interval low inspiration and development of a moderate layering left pleural effusion with overlying atelectasis or consolidation of the left lower lung field. The hypoexpanded right lung is clear. No pneumothorax is seen. Thoracic cage is intact. IMPRESSION: New finding of moderate left pleural effusion with overlying atelectasis or consolidation in left lower lung field. Clinical correlation and follow-up recommended. Electronically Signed   By: Telford Nab M.D.   On: 07/11/2021 22:04   CT Angio Chest  PE W and/or Wo Contrast  Result Date: 07/11/2021 CLINICAL DATA:  Shortness of breath x2 days. EXAM: CT ANGIOGRAPHY CHEST WITH CONTRAST TECHNIQUE: Multidetector CT imaging of the chest was performed using the standard protocol during bolus administration of intravenous contrast. Multiplanar CT image reconstructions and MIPs were obtained to evaluate the vascular anatomy. CONTRAST:  161mL OMNIPAQUE IOHEXOL 350 MG/ML SOLN COMPARISON:  None. FINDINGS: Cardiovascular: The subsegmental pulmonary arteries are limited in evaluation secondary to suboptimal opacification with intravenous contrast as well as areas of overlying artifact. No evidence of pulmonary embolism. Normal heart size with mild coronary artery calcification. No pericardial effusion. Mediastinum/Nodes: No enlarged mediastinal, hilar, or axillary lymph nodes. Thyroid gland, trachea, and esophagus demonstrate no significant findings. Lungs/Pleura: Moderate to marked severity atelectasis and/or infiltrate is seen within the left lower lobe and along the periphery of the left upper lobe. Mild posterior right basilar atelectasis and/or infiltrate is also seen. A small to moderate size left-sided pleural effusion is seen. No pneumothorax is identified. Upper Abdomen: No acute  abnormality. Musculoskeletal: No chest wall abnormality. No acute or significant osseous findings. Review of the MIP images confirms the above findings. IMPRESSION: 1. Moderate to marked severity left lower lobe and left upper lobe atelectasis and/or infiltrate. 2. Mild posterior right basilar atelectasis and/or infiltrate. 3. Small to moderate size left-sided pleural effusion. 4. Limited evaluation of the pulmonary arteries, as described above, without definite evidence of pulmonary embolism. Electronically Signed   By: Virgina Norfolk M.D.   On: 07/11/2021 23:14   [place] CHEST (PLEURAL EFFUSION)  Result Date: 07/12/2021 CLINICAL DATA:  Left pleural effusion. EXAM: CHEST ULTRASOUND COMPARISON:  July 11, 2021. FINDINGS: Sonographic evaluation of the left hemithorax demonstrates small and somewhat complex appearing left pleural effusion. Adequate fluid pocket for thoracentesis is not visualized. IMPRESSION: Small and somewhat complex appearing left pleural effusion is noted. Electronically Signed   By: Marijo Conception M.D.   On: 07/12/2021 10:38   DG Chest Port 1 View  Result Date: 07/14/2021 CLINICAL DATA:  Shortness of breath, ICU EXAM: PORTABLE CHEST 1 VIEW COMPARISON:  Chest radiograph dated 1 day prior FINDINGS: The cardiomediastinal silhouette is suboptimally evaluated. A large left pleural effusion with near complete opacification of the left hemithorax is again seen with a small amount of aerated left upper lobe, not significantly changed compared to the study from 1 day prior. There is a possible trace right pleural effusion. The right lung otherwise remains clear. There is no right effusion. There is no pneumothorax. The bones are stable. IMPRESSION: 1. Large left pleural effusion with a small amount of aerated left upper lobe is not significantly changed compared to the study from 1 day prior. 2. Unchanged possible trace right pleural effusion. Electronically Signed   By: Valetta Mole M.D.    On: 07/14/2021 10:45   DG Chest Port 1 View  Result Date: 07/13/2021 CLINICAL DATA:  Shortness of breath, left pleural effusion EXAM: PORTABLE CHEST 1 VIEW COMPARISON:  Previous studies including the examination of 07/12/2021 FINDINGS: There is further increase in size of left pleural effusion with almost complete opacification. There is decreased aeration in the left lung with small area of aeration in the left upper lung fields. Increased density in the right lower lung fields Brumm be due to pleural effusion and possibly underlying atelectasis. There is no pneumothorax. IMPRESSION: There is increase in amount of large left pleural effusion with almost complete atelectasis of left lung with small residual area of aeration in the  left upper lung fields. Increased density in the right lower lung fields suggests small effusion and possibly underlying atelectasis with slight improvement. Electronically Signed   By: Elmer Picker M.D.   On: 07/13/2021 08:11   DG CHEST PORT 1 VIEW  Result Date: 07/12/2021 CLINICAL DATA:  Increased shortness of breath EXAM: PORTABLE CHEST 1 VIEW COMPARISON:  Chest x-ray dated July 11, 2021 FINDINGS: Visualized cardiac and mediastinal contours are unchanged. Small right and large left pleural effusions with associated atelectasis, increased compared to prior exam. New rightward deviation of the trachea. No evidence of pneumothorax. IMPRESSION: 1. Small right and large left pleural effusions with associated atelectasis, increased compared to prior exam. 2. New rightward deviation of the trachea, likely related to large left pleural effusion. Electronically Signed   By: Yetta Glassman M.D.   On: 07/12/2021 14:05    Kathie Dike, MD  [facility]  If 7PM-7AM, please contact night-coverage [email or web address]  07/14/2021, 8:31 PM   LOS: 2 days

## 2021-07-15 DIAGNOSIS — I10 Essential (primary) hypertension: Secondary | ICD-10-CM | POA: Diagnosis not present

## 2021-07-15 DIAGNOSIS — J189 Pneumonia, unspecified organism: Secondary | ICD-10-CM | POA: Diagnosis not present

## 2021-07-15 DIAGNOSIS — J181 Lobar pneumonia, unspecified organism: Secondary | ICD-10-CM | POA: Diagnosis not present

## 2021-07-15 DIAGNOSIS — J9601 Acute respiratory failure with hypoxia: Secondary | ICD-10-CM | POA: Diagnosis not present

## 2021-07-15 NOTE — Progress Notes (Signed)
PROGRESS NOTE  Victor [name] NOM:[id number] DOB: 1976/07/02 DOA: 07/11/2021 PCP: [name], DO   Brief History:  45 year old male with a history of hypertension, hyperlipidemia, tobacco abuse, GERD, chronic knee pain status post right TKA and multiple revisions presenting with shortness of breath and left-sided chest pain.  The patient states that his shortness of breath began on the evening of 07/05/2021.  He has had a nonproductive cough.  He denies any fevers, chills, headache, neck pain, nausea, vomiting, diarrhea, abdominal pain.  However he has had some sharp pleuritic type left-sided chest pain rating down to his left flank and left upper quadrant.  He has not had any diarrhea, hematochezia, melena.  He denies any sick contacts.  He denies any new medications.  He continues to smoke 1 pack a day.  Patient went to urgent care on the morning of 07/11/2021.  However, he had worsening shortness of breath when he got home resulting in activation of EMS.  He was not able to pick up his prescriptions prescribed at urgent care including antibiotics and the steroids. The emergency department, the patient was afebrile but tachycardic up to 110 with oxygen saturation 90-94% on 6 L.  He was started on Rocephin and azithromycin.  CTA chest was negative for PE but showed moderate to severe atelectasis/infiltrate of the left lower lobe and left upper lobe with small to moderate pleural effusion.   Assessment/Plan: Sepsis -Present on admission -Presented with leukocytosis, tachycardia, tachypnea into the 30s -Secondary to pneumonia -Lactic acid peaked 1.6 -PCT 4.98>>3.14 -Continue ceftriaxone and azithromycin   Acute respiratory failure with hypoxia -Presented with saturation 86% on room air and tachypnea to the 30s -Secondary to pneumonia and left pleural effusion -Currently on 10L HFNC -Wean oxygen as tolerated for saturation greater 90% -consulted and discussed with pulmonary,  Dr. Halford Chessman   Left pleural effusion/pleuritic CP -Request thoracocentesis--not enough fluid to perform -multiple bedside US performed by Dr. Fanny Dance significant fluid -continue steroids -continue IV dilaudid>>1 mg IV q 2 prn -11/18--personally reviewed CXR--progressive opacification of Left hemithorax -Repeat x-ray in a.m.    Lobar pneumonia -Continue ceftriaxone and azithromycin -Continue Xopenex and Atrovent -continue pulmicort and arformoterol -Urinary antigens for pneumococcus and Legionella are negative -We will reorder sputum culture -WBC count is trending up, although he is also receiving steroids   Hypertension -restarted amlodipine -BP remains elevated in part due to pain   Tobacco abuse -Tobacco cessation discussed -He has a 30-pack-year history   Chronic Right knee pain -PDMP reviewed -he receives monthly percocet 7.5 and pregabalin   Hypokalemia -replete -check mag--2.0   Abdominal pain -CT abd/pelvis--no acute intraabdominal findings           Status is: Inpatient   Remains inpatient appropriate because: severity of illness requiring high flow oxygen, IV antibiotics               Family Communication:   spouse updated at bedside 11/19   Consultants:  none   Code Status:  FULL    DVT Prophylaxis:  [place] Lovenox     Procedures: As Listed in Progress Note Above   Antibiotics: Ceftriaxone 11/16>> Azithro 11/16>>      Subjective: Feels that his breathing is about the same as it was yesterday.  Continues to have productive cough.  Objective: Vitals:   07/15/21 0848 07/15/21 1000 07/15/21 1131 07/15/21 1355  BP:    (!) 148/91  Pulse:  87 75 96  Resp:   (!) 22 17  Temp:   98.1 F (36.7 C)   TempSrc:   Oral   SpO2: 97% 96% 94% 96%  Weight:      Height:        Intake/Output Summary (Last 24 hours) at 07/15/2021 1525 Last data filed at 07/15/2021 1415 Gross per 24 hour  Intake 1070 ml  Output --  Net 1070 ml   Weight change:   Exam:  General exam: Alert, awake, oriented x 3 Respiratory system: Diminished breath sounds on left side.  Increased respiratory effort. Cardiovascular system:RRR. No murmurs, rubs, gallops. Gastrointestinal system: Abdomen is nondistended, soft and nontender. No organomegaly or masses felt. Normal bowel sounds heard. Central nervous system: Alert and oriented. No focal neurological deficits. Extremities: No C/C/E, +pedal pulses Skin: No rashes, lesions or ulcers Psychiatry: Judgement and insight appear normal. Mood & affect appropriate.     Data Reviewed: I have personally reviewed following labs and imaging studies Basic Metabolic Panel: Recent Labs  Lab 07/11/21 2130 07/12/21 0331 07/13/21 0447 07/14/21 0449  NA 135 134* 135 136  K 3.2* 3.4* 3.8 3.7  CL 97* 100 101 101  CO2 26 25 25 26   GLUCOSE 129* 158* 161* 149*  BUN 14 12 11 15   CREATININE 0.90 0.90 0.70 0.78  CALCIUM 8.8* 8.1* 8.4* 8.5*  MG  --  1.7 2.0  --   PHOS  --  3.9  --   --    Liver Function Tests: Recent Labs  Lab 07/11/21 2130 07/12/21 0331  AST 22 26  ALT 23 28  ALKPHOS 101 98  BILITOT 0.6 0.7  PROT 8.2* 7.0  ALBUMIN 3.8 3.2*   No results for input(s): LIPASE, AMYLASE in the last 168 hours. No results for input(s): AMMONIA in the last 168 hours. Coagulation Profile: Recent Labs  Lab 07/11/21 2130  INR 1.0   CBC: Recent Labs  Lab 07/11/21 2130 07/12/21 0443 07/13/21 0447 07/14/21 0449  WBC 15.8* 19.3* 21.9* 25.0*  NEUTROABS 13.5*  --   --   --   HGB 14.6 13.8 13.5 13.0  HCT 43.3 42.2 40.6 40.0  MCV 88.9 91.5 90.6 91.1  PLT 388 383 372 392   Cardiac Enzymes: No results for input(s): CKTOTAL, CKMB, CKMBINDEX, TROPONINI in the last 168 hours. BNP: Invalid input(s): POCBNP CBG: No results for input(s): GLUCAP in the last 168 hours. HbA1C: No results for input(s): HGBA1C in the last 72 hours. Urine analysis:    Component Value Date/Time   COLORURINE YELLOW 07/11/2021 2245    APPEARANCEUR CLEAR 07/11/2021 2245   LABSPEC 1.021 07/11/2021 2245   PHURINE 5.0 07/11/2021 2245   GLUCOSEU NEGATIVE 07/11/2021 2245   HGBUR MODERATE (A) 07/11/2021 2245   BILIRUBINUR NEGATIVE 07/11/2021 2245   KETONESUR NEGATIVE 07/11/2021 2245   PROTEINUR 30 (A) 07/11/2021 2245   NITRITE NEGATIVE 07/11/2021 2245   LEUKOCYTESUR NEGATIVE 07/11/2021 2245   Sepsis Labs: @LABRCNTIP (procalcitonin:4,lacticidven:4) ) Recent Results (from the past 240 hour(s))  Resp Panel by RT-PCR (Flu A&B, Covid) Nasopharyngeal Swab     Status: None   Collection Time: 07/11/21  9:05 PM   Specimen: Nasopharyngeal Swab; Nasopharyngeal(NP) swabs in vial transport medium  Result Value Ref Range Status   SARS Coronavirus 2 by RT PCR NEGATIVE NEGATIVE Final    Comment: (NOTE) SARS-CoV-2 target nucleic acids are NOT DETECTED.  The SARS-CoV-2 RNA is generally detectable in upper respiratory specimens during the acute phase of infection. The lowest concentration of SARS-CoV-2 viral  copies this assay can detect is 138 copies/mL. A negative result does not preclude SARS-Cov-2 infection and should not be used as the sole basis for treatment or other patient management decisions. A negative result [place] occur with  improper specimen collection/handling, submission of specimen other than nasopharyngeal swab, presence of viral mutation(s) within the areas targeted by this assay, and inadequate number of viral copies(<138 copies/mL). A negative result must be combined with clinical observations, patient history, and epidemiological information. The expected result is Negative.  Fact Sheet for Patients:  EntrepreneurPulse.com.au  Fact Sheet for Healthcare Providers:  IncredibleEmployment.be  This test is no t yet approved or cleared by the [place] FDA and  has been authorized for detection and/or diagnosis of SARS-CoV-2 by FDA under an Emergency Use Authorization (EUA).  This EUA will remain  in effect (meaning this test can be used) for the duration of the COVID-19 declaration under Section 564(b)(1) of the Act, 21 U.S.C.section 360bbb-3(b)(1), unless the authorization is terminated  or revoked sooner.       Influenza A by PCR NEGATIVE NEGATIVE Final   Influenza B by PCR NEGATIVE NEGATIVE Final    Comment: (NOTE) The Xpert Xpress SARS-CoV-2/FLU/RSV plus assay is intended as an aid in the diagnosis of influenza from Nasopharyngeal swab specimens and should not be used as a sole basis for treatment. Nasal washings and aspirates are unacceptable for Xpert Xpress SARS-CoV-2/FLU/RSV testing.  Fact Sheet for Patients: EntrepreneurPulse.com.au  Fact Sheet for Healthcare Providers: IncredibleEmployment.be  This test is not yet approved or cleared by the [place] FDA and has been authorized for detection and/or diagnosis of SARS-CoV-2 by FDA under an Emergency Use Authorization (EUA). This EUA will remain in effect (meaning this test can be used) for the duration of the COVID-19 declaration under Section 564(b)(1) of the Act, 21 U.S.C. section 360bbb-3(b)(1), unless the authorization is terminated or revoked.  Performed at [facility], [address] [place], [place] [address]   Culture, blood (Routine x 2)     Status: None (Preliminary result)   Collection Time: 07/11/21  9:30 PM   Specimen: BLOOD  Result Value Ref Range Status   Specimen Description BLOOD LEFT ANTECUBITAL  Final   Special Requests   Final    BOTTLES DRAWN AEROBIC AND ANAEROBIC Blood Culture adequate volume   Culture   Final    NO GROWTH 4 DAYS Performed at [facility], [address], [place], [place] [address]    Report Status PENDING  Incomplete  Culture, blood (Routine x 2)     Status: None (Preliminary result)   Collection Time: 07/11/21  9:30 PM   Specimen: BLOOD  Result Value Ref Range Status   Specimen Description BLOOD BLOOD  LEFT HAND  Final   Special Requests   Final    BOTTLES DRAWN AEROBIC AND ANAEROBIC Blood Culture adequate volume   Culture   Final    NO GROWTH 4 DAYS Performed at [facility], [address] [place], [place] [address]    Report Status PENDING  Incomplete  MRSA Next Gen by PCR, Nasal     Status: None   Collection Time: 07/12/21  5:00 AM  Result Value Ref Range Status   MRSA by PCR Next Gen NOT DETECTED NOT DETECTED Final    Comment: (NOTE) The GeneXpert MRSA Assay (FDA approved for NASAL specimens only), is one component of a comprehensive MRSA colonization surveillance program. It is not intended to diagnose MRSA infection nor to guide or monitor  treatment for MRSA infections. Test performance is not FDA approved in patients less than 70 years old. Performed at [facility], [address] [place], [place] [address]      Scheduled Meds:  amLODipine  5 mg Oral Daily   arformoterol  15 mcg Nebulization BID   budesonide (PULMICORT) nebulizer solution  0.5 mg Nebulization BID   calcium carbonate  1 tablet Oral Q breakfast   Chlorhexidine Gluconate Cloth  6 each Topical Daily   dextromethorphan-guaiFENesin  1 tablet Oral BID   docusate sodium  100 mg Oral BID   enoxaparin (LOVENOX) injection  60 mg Subcutaneous Daily   ipratropium  0.5 mg Nebulization BID   levalbuterol  0.63 mg Nebulization BID   lidocaine  1 patch Transdermal Q24H   methylPREDNISolone (SOLU-MEDROL) injection  60 mg Intravenous Q12H   nicotine  21 mg Transdermal Daily   Continuous Infusions:  azithromycin 500 mg (07/14/21 2233)   cefTRIAXone (ROCEPHIN)  IV 2 g (07/14/21 2151)    Procedures/Studies: CT ABDOMEN PELVIS WO CONTRAST  Result Date: 07/12/2021 CLINICAL DATA:  Abdominal pain and sepsis. Left-sided abdominal pain since last evening. History of prior appendectomy. EXAM: CT ABDOMEN AND PELVIS WITHOUT CONTRAST TECHNIQUE: Multidetector CT imaging of the abdomen and pelvis was performed following the  standard protocol without IV contrast. COMPARISON:  Chest CT from yesterday. FINDINGS: Lower chest: Progressive dense left lower lobe airspace opacification most likely lobar pneumonia with an associated moderate-sized parapneumonic effusion, increased since yesterday's study. Progressive right basilar atelectasis. Hepatobiliary: No hepatic lesions or intrahepatic biliary dilatation. High attenuation material in the gallbladder likely vicarious excretion from the chest CT from yesterday. There appear to be some filling defects also suggesting gallstones. No CT findings to suggest acute cholecystitis. No common bile duct dilatation. Pancreas: No mass, inflammation or ductal dilatation. Spleen: Normal size.  No focal lesions. Adrenals/Urinary Tract: Adrenal glands and kidneys are grossly normal. No renal or obstructing ureteral calculi. No bladder calculi. No mass lesions. The bladder contains high attenuation material likely excreted contrast material from yesterday CT scan. Stomach/Bowel: The stomach, duodenum, small bowel and colon are unremarkable. No acute inflammatory changes, mass lesions or obstructive findings. Vascular/Lymphatic: The aorta is normal in caliber. Minimal scattered atheroscerlotic calcifications. No mesenteric of retroperitoneal mass or adenopathy. Small scattered lymph nodes are noted. Reproductive: The prostate gland and seminal vesicles are unremarkable. Other: Small periumbilical abdominal wall hernia containing fat. No free air or free fluid in the abdomen/pelvis. Musculoskeletal: No significant bony findings. IMPRESSION: 1. Progressive dense left lower lobe airspace opacification most likely lobar pneumonia with an associated moderate-sized parapneumonic effusion, increased since yesterday's study. 2. Progressive right basilar atelectasis. 3. No acute abdominal/pelvic findings, mass lesions or adenopathy. 4. Cholelithiasis. Electronically Signed   By: Marijo Sanes M.D.   On: 07/12/2021  14:48   DG Chest 2 View  Result Date: 07/11/2021 CLINICAL DATA:  Shortness of breath and oxygen desaturation. EXAM: CHEST - 2 VIEW COMPARISON:  PA Lat 02/16/2019. FINDINGS: The cardiac size is normal. Central vessels are normal caliber. There is interval low inspiration and development of a moderate layering left pleural effusion with overlying atelectasis or consolidation of the left lower lung field. The hypoexpanded right lung is clear. No pneumothorax is seen. Thoracic cage is intact. IMPRESSION: New finding of moderate left pleural effusion with overlying atelectasis or consolidation in left lower lung field. Clinical correlation and follow-up recommended. Electronically Signed   By: Telford Nab M.D.   On: 07/11/2021 22:04  CT Angio Chest PE W and/or Wo Contrast  Result Date: 07/11/2021 CLINICAL DATA:  Shortness of breath x2 days. EXAM: CT ANGIOGRAPHY CHEST WITH CONTRAST TECHNIQUE: Multidetector CT imaging of the chest was performed using the standard protocol during bolus administration of intravenous contrast. Multiplanar CT image reconstructions and MIPs were obtained to evaluate the vascular anatomy. CONTRAST:  169mL OMNIPAQUE IOHEXOL 350 MG/ML SOLN COMPARISON:  None. FINDINGS: Cardiovascular: The subsegmental pulmonary arteries are limited in evaluation secondary to suboptimal opacification with intravenous contrast as well as areas of overlying artifact. No evidence of pulmonary embolism. Normal heart size with mild coronary artery calcification. No pericardial effusion. Mediastinum/Nodes: No enlarged mediastinal, hilar, or axillary lymph nodes. Thyroid gland, trachea, and esophagus demonstrate no significant findings. Lungs/Pleura: Moderate to marked severity atelectasis and/or infiltrate is seen within the left lower lobe and along the periphery of the left upper lobe. Mild posterior right basilar atelectasis and/or infiltrate is also seen. A small to moderate size left-sided pleural  effusion is seen. No pneumothorax is identified. Upper Abdomen: No acute abnormality. Musculoskeletal: No chest wall abnormality. No acute or significant osseous findings. Review of the MIP images confirms the above findings. IMPRESSION: 1. Moderate to marked severity left lower lobe and left upper lobe atelectasis and/or infiltrate. 2. Mild posterior right basilar atelectasis and/or infiltrate. 3. Small to moderate size left-sided pleural effusion. 4. Limited evaluation of the pulmonary arteries, as described above, without definite evidence of pulmonary embolism. Electronically Signed   By: Virgina Norfolk M.D.   On: 07/11/2021 23:14   [place] CHEST (PLEURAL EFFUSION)  Result Date: 07/12/2021 CLINICAL DATA:  Left pleural effusion. EXAM: CHEST ULTRASOUND COMPARISON:  July 11, 2021. FINDINGS: Sonographic evaluation of the left hemithorax demonstrates small and somewhat complex appearing left pleural effusion. Adequate fluid pocket for thoracentesis is not visualized. IMPRESSION: Small and somewhat complex appearing left pleural effusion is noted. Electronically Signed   By: Marijo Conception M.D.   On: 07/12/2021 10:38   DG Chest Port 1 View  Result Date: 07/14/2021 CLINICAL DATA:  Shortness of breath, ICU EXAM: PORTABLE CHEST 1 VIEW COMPARISON:  Chest radiograph dated 1 day prior FINDINGS: The cardiomediastinal silhouette is suboptimally evaluated. A large left pleural effusion with near complete opacification of the left hemithorax is again seen with a small amount of aerated left upper lobe, not significantly changed compared to the study from 1 day prior. There is a possible trace right pleural effusion. The right lung otherwise remains clear. There is no right effusion. There is no pneumothorax. The bones are stable. IMPRESSION: 1. Large left pleural effusion with a small amount of aerated left upper lobe is not significantly changed compared to the study from 1 day prior. 2. Unchanged possible trace  right pleural effusion. Electronically Signed   By: Valetta Mole M.D.   On: 07/14/2021 10:45   DG Chest Port 1 View  Result Date: 07/13/2021 CLINICAL DATA:  Shortness of breath, left pleural effusion EXAM: PORTABLE CHEST 1 VIEW COMPARISON:  Previous studies including the examination of 07/12/2021 FINDINGS: There is further increase in size of left pleural effusion with almost complete opacification. There is decreased aeration in the left lung with small area of aeration in the left upper lung fields. Increased density in the right lower lung fields [place] be due to pleural effusion and possibly underlying atelectasis. There is no pneumothorax. IMPRESSION: There is increase in amount of large left pleural effusion with almost complete atelectasis of left lung with small residual area of  aeration in the left upper lung fields. Increased density in the right lower lung fields suggests small effusion and possibly underlying atelectasis with slight improvement. Electronically Signed   By: Elmer Picker M.D.   On: 07/13/2021 08:11   DG CHEST PORT 1 VIEW  Result Date: 07/12/2021 CLINICAL DATA:  Increased shortness of breath EXAM: PORTABLE CHEST 1 VIEW COMPARISON:  Chest x-ray dated July 11, 2021 FINDINGS: Visualized cardiac and mediastinal contours are unchanged. Small right and large left pleural effusions with associated atelectasis, increased compared to prior exam. New rightward deviation of the trachea. No evidence of pneumothorax. IMPRESSION: 1. Small right and large left pleural effusions with associated atelectasis, increased compared to prior exam. 2. New rightward deviation of the trachea, likely related to large left pleural effusion. Electronically Signed   By: Yetta Glassman M.D.   On: 07/12/2021 14:05    Kathie Dike, MD  [facility]  If 7PM-7AM, please contact night-coverage [email or web address]  07/15/2021, 3:25 PM   LOS: 3 days

## 2021-07-16 ENCOUNTER — Inpatient Hospital Stay (HOSPITAL_COMMUNITY): Payer: Medicaid Other

## 2021-07-16 DIAGNOSIS — I10 Essential (primary) hypertension: Secondary | ICD-10-CM | POA: Diagnosis not present

## 2021-07-16 DIAGNOSIS — J189 Pneumonia, unspecified organism: Secondary | ICD-10-CM | POA: Diagnosis not present

## 2021-07-16 DIAGNOSIS — J9601 Acute respiratory failure with hypoxia: Secondary | ICD-10-CM | POA: Diagnosis not present

## 2021-07-16 LAB — CULTURE, BLOOD (ROUTINE X 2)
Culture: NO GROWTH
Culture: NO GROWTH
Special Requests: ADEQUATE
Special Requests: ADEQUATE

## 2021-07-16 LAB — EXPECTORATED SPUTUM ASSESSMENT W GRAM STAIN, RFLX TO RESP C

## 2021-07-16 MED ORDER — CLONIDINE HCL 0.1 MG PO TABS
0.1000 mg | ORAL_TABLET | Freq: Two times a day (BID) | ORAL | Status: DC
  Administered 2021-07-16 – 2021-07-19 (×7): 0.1 mg via ORAL
  Filled 2021-07-16 (×7): qty 1

## 2021-07-16 NOTE — Progress Notes (Signed)
NAME:  Phillipe Clemon Pavon, MRN:  [id number], DOB:  08/19/1976, LOS: 4 ADMISSION DATE:  07/11/2021, CONSULTATION DATE:  07/12/2021 REFERRING MD:  Dr. Carles Collet, [facility], CHIEF COMPLAINT:  Short of breath   History of Present Illness:  45 yo male smoker developed pain in his left chest on 07/06/21.  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  He then developed cough with rust colored sputum, chills, sweats, and shakes.  Appetite has decreased.  Has pleuritic pain on left side causing splinting with respiration.  In ER he had SpO2 86% on room air.  CT chest showed LLL infiltrate and small effusion.  He was started on antibiotics, supplemental oxygen and solumedrol.  PCCM asked to assist with management in ICU.  Pertinent  Medical History  GERD, HTN, Stomach ulcer  Significant Hospital Events: Including procedures, antibiotic start and stop dates in addition to other pertinent events   11/16 admit 11/17 add solumedrol for pleurisy  Studies:  CT angio chest 07/11/21 >> LLL infiltrate, small Lt pleural effusion U/S chest 07/12/21 >> small, complex effusion Urinary strep 11/16 neg Urinary legionella 11/16 neg  MRSA PCR screen 11/17 neg    Scheduled Meds:  amLODipine  5 mg Oral Daily   arformoterol  15 mcg Nebulization BID   budesonide (PULMICORT) nebulizer solution  0.5 mg Nebulization BID   calcium carbonate  1 tablet Oral Q breakfast   Chlorhexidine Gluconate Cloth  6 each Topical Daily   dextromethorphan-guaiFENesin  1 tablet Oral BID   docusate sodium  100 mg Oral BID   enoxaparin (LOVENOX) injection  60 mg Subcutaneous Daily   ipratropium  0.5 mg Nebulization BID   levalbuterol  0.63 mg Nebulization BID   lidocaine  1 patch Transdermal Q24H   methylPREDNISolone (SOLU-MEDROL) injection  60 mg Intravenous Q12H   nicotine  21 mg Transdermal Daily   Continuous Infusions:  cefTRIAXone (ROCEPHIN)  IV Stopped (07/15/21 2208)   PRN Meds:.acetaminophen **OR**  acetaminophen, acetaminophen, HYDROmorphone (DILAUDID) injection, levalbuterol, methocarbamol   Interim History / Subjective:  Sitting up in chair, still on high flow, no appetite, L pleuritic cp a little better   Objective   Blood pressure 123/77, pulse 89, temperature 98.2 F (36.8 C), temperature source Oral, resp. rate 18, height 5\' 11"  (1.803 m), weight 113.2 kg, SpO2 90 %.        Intake/Output Summary (Last 24 hours) at 07/16/2021 1404 Last data filed at 07/16/2021 0800 Gross per 24 hour  Intake 950 ml  Output 500 ml  Net 450 ml   Filed Weights   07/11/21 2058 07/12/21 0515  Weight: 113.9 kg 113.2 kg    Examination: Tmax  98.2  General appearance:    middle aged wm sitting in chair  At Rest 02 sats  91% on 7lpm   No jvd Oropharynx clear,  mucosa nl Neck supple Lungs with  coarsened bs L base RRR no s3 or or sign murmur Abd soft/ non-tender  Extr warm with no edema or clubbing noted Neuro  Sensorium alert/ approp ,  no apparent motor deficits    I personally reviewed images and agree with radiology impression as follows:  CXR:   portable 11/21  Improvement since previous study. Persistent moderate left pleural effusion with associated atelectasis/infiltrate. My review:  appears loculated against the L lat chest wall   Resolved Hospital Problem list     Assessment & Plan:   Bacterial community acquired pneumonia with lobar consolidation  of LLL. - day 5 of rocephin and zithromax   Lt pleuritic chest pain. - reviewed bedside u/s on 07/13/21, and only small effusion - continue solumedrol, analgesics    Acute hypoxic respiratory failure. - goal SpO2 > 92% / titrate down as tol    HBP with prior h/o narcotic use for R knee pain control  >>> rec change amlodipine (which in theory interferes with v/q matching in areas of lung dz) to low dose clonidine         Labs    CMP Latest Ref Rng & Units 07/14/2021 07/13/2021 07/12/2021  Glucose 70 - 99 mg/dL  149(H) 161(H) 158(H)  BUN 6 - 20 mg/dL 15 11 12   Creatinine 0.61 - 1.24 mg/dL 0.78 0.70 0.90  Sodium 135 - 145 mmol/L 136 135 134(L)  Potassium 3.5 - 5.1 mmol/L 3.7 3.8 3.4(L)  Chloride 98 - 111 mmol/L 101 101 100  CO2 22 - 32 mmol/L 26 25 25   Calcium 8.9 - 10.3 mg/dL 8.5(L) 8.4(L) 8.1(L)  Total Protein 6.5 - 8.1 g/dL - - 7.0  Total Bilirubin 0.3 - 1.2 mg/dL - - 0.7  Alkaline Phos 38 - 126 U/L - - 98  AST 15 - 41 U/L - - 26  ALT 0 - 44 U/L - - 28    CBC Latest Ref Rng & Units 07/14/2021 07/13/2021 07/12/2021  WBC 4.0 - 10.5 K/uL 25.0(H) 21.9(H) 19.3(H)  Hemoglobin 13.0 - 17.0 g/dL 13.0 13.5 13.8  Hematocrit 39.0 - 52.0 % 40.0 40.6 42.2  Platelets 150 - 400 K/uL [phone]      Christinia Gully, MD Pulmonary and [place] [phone]   After 7:00 pm call Elink  [phone]

## 2021-07-16 NOTE — Progress Notes (Signed)
PROGRESS NOTE  Victor [name] WRU:[id number] DOB: 1976/05/03 DOA: 07/11/2021 PCP: [name], DO   Brief History:  45 year old male with a history of hypertension, hyperlipidemia, tobacco abuse, GERD, chronic knee pain status post right TKA and multiple revisions presenting with shortness of breath and left-sided chest pain.  The patient states that his shortness of breath began on the evening of 07/05/2021.  He has had a nonproductive cough.  He denies any fevers, chills, headache, neck pain, nausea, vomiting, diarrhea, abdominal pain.  However he has had some sharp pleuritic type left-sided chest pain rating down to his left flank and left upper quadrant.  He has not had any diarrhea, hematochezia, melena.  He denies any sick contacts.  He denies any new medications.  He continues to smoke 1 pack a day.  Patient went to urgent care on the morning of 07/11/2021.  However, he had worsening shortness of breath when he got home resulting in activation of EMS.  He was not able to pick up his prescriptions prescribed at urgent care including antibiotics and the steroids. The emergency department, the patient was afebrile but tachycardic up to 110 with oxygen saturation 90-94% on 6 L.  He was started on Rocephin and azithromycin.  CTA chest was negative for PE but showed moderate to severe atelectasis/infiltrate of the left lower lobe and left upper lobe with small to moderate pleural effusion.   Assessment/Plan: Sepsis -Present on admission -Presented with leukocytosis, tachycardia, tachypnea into the 30s -Secondary to pneumonia -Lactic acid peaked 1.6 -PCT 4.98>>3.14 -Continue ceftriaxone and azithromycin   Acute respiratory failure with hypoxia -Presented with saturation 86% on room air and tachypnea to the 30s -Secondary to pneumonia and left pleural effusion -Currently on 10L HFNC -Wean oxygen as tolerated for saturation greater 90% -Appreciate PCCM assistance   Left  pleural effusion/pleuritic CP -Request thoracocentesis--not enough fluid to perform -multiple bedside US performed by Dr. Fanny Dance significant fluid -continue steroids -continue IV dilaudid>>1 mg IV q 2 prn -11/18--personally reviewed CXR--progressive opacification of Left hemithorax -X-ray from 11/21 shows improving aeration on the left with persistent pleural effusion    Lobar pneumonia -Continue ceftriaxone and azithromycin -Continue Xopenex and Atrovent -continue pulmicort and arformoterol -Urinary antigens for pneumococcus and Legionella are negative -Sputum culture shows moderate GPC -WBC count is trending up, although he is also receiving steroids   Hypertension -Amlodipine discontinued and favor of low-dose clonidine, since amlodipine can interfere with VQ matching and areas of lung disease -BP currently stable   Tobacco abuse -Tobacco cessation discussed -He has a 30-pack-year history   Chronic Right knee pain -PDMP reviewed -he receives monthly percocet 7.5 and pregabalin   Hypokalemia -replete -check mag--2.0   Abdominal pain -CT abd/pelvis--no acute intraabdominal findings           Status is: Inpatient   Remains inpatient appropriate because: severity of illness requiring high flow oxygen, IV antibiotics               Family Communication:   Family updated at bedside 11/19   Consultants: PCCM   Code Status:  FULL    DVT Prophylaxis:  [place] Lovenox     Procedures: As Listed in Progress Note Above   Antibiotics: Ceftriaxone 11/16>> Azithro 11/16>>      Subjective: Still feels short of breath.  Continues to have productive cough.  Objective: Vitals:   07/16/21 1620 07/16/21 1700 07/16/21 1716 07/16/21 1800  BP:  Pulse: 82 85 81 72  Resp: 20 (!) 26 18 15   Temp: 97.9 F (36.6 C)     TempSrc: Oral     SpO2: 94% 91% 93% 94%  Weight:      Height:        Intake/Output Summary (Last 24 hours) at 07/16/2021 1917 Last data  filed at 07/16/2021 1400 Gross per 24 hour  Intake 890 ml  Output 500 ml  Net 390 ml   Weight change:  Exam:  General exam: Alert, awake, oriented x 3 Respiratory system: Improving aeration on the left, crackles on the left, increased respiratory effort Cardiovascular system:RRR. No murmurs, rubs, gallops. Gastrointestinal system: Abdomen is nondistended, soft and nontender. No organomegaly or masses felt. Normal bowel sounds heard. Central nervous system: Alert and oriented. No focal neurological deficits. Extremities: No C/C/E, +pedal pulses Skin: No rashes, lesions or ulcers Psychiatry: Judgement and insight appear normal. Mood & affect appropriate.      Data Reviewed: I have personally reviewed following labs and imaging studies Basic Metabolic Panel: Recent Labs  Lab 07/11/21 2130 07/12/21 0331 07/13/21 0447 07/14/21 0449  NA 135 134* 135 136  K 3.2* 3.4* 3.8 3.7  CL 97* 100 101 101  CO2 26 25 25 26   GLUCOSE 129* 158* 161* 149*  BUN 14 12 11 15   CREATININE 0.90 0.90 0.70 0.78  CALCIUM 8.8* 8.1* 8.4* 8.5*  MG  --  1.7 2.0  --   PHOS  --  3.9  --   --    Liver Function Tests: Recent Labs  Lab 07/11/21 2130 07/12/21 0331  AST 22 26  ALT 23 28  ALKPHOS 101 98  BILITOT 0.6 0.7  PROT 8.2* 7.0  ALBUMIN 3.8 3.2*   No results for input(s): LIPASE, AMYLASE in the last 168 hours. No results for input(s): AMMONIA in the last 168 hours. Coagulation Profile: Recent Labs  Lab 07/11/21 2130  INR 1.0   CBC: Recent Labs  Lab 07/11/21 2130 07/12/21 0443 07/13/21 0447 07/14/21 0449  WBC 15.8* 19.3* 21.9* 25.0*  NEUTROABS 13.5*  --   --   --   HGB 14.6 13.8 13.5 13.0  HCT 43.3 42.2 40.6 40.0  MCV 88.9 91.5 90.6 91.1  PLT 388 383 372 392   Cardiac Enzymes: No results for input(s): CKTOTAL, CKMB, CKMBINDEX, TROPONINI in the last 168 hours. BNP: Invalid input(s): POCBNP CBG: No results for input(s): GLUCAP in the last 168 hours. HbA1C: No results for  input(s): HGBA1C in the last 72 hours. Urine analysis:    Component Value Date/Time   COLORURINE YELLOW 07/11/2021 2245   APPEARANCEUR CLEAR 07/11/2021 2245   LABSPEC 1.021 07/11/2021 2245   PHURINE 5.0 07/11/2021 2245   GLUCOSEU NEGATIVE 07/11/2021 2245   HGBUR MODERATE (A) 07/11/2021 2245   BILIRUBINUR NEGATIVE 07/11/2021 2245   KETONESUR NEGATIVE 07/11/2021 2245   PROTEINUR 30 (A) 07/11/2021 2245   NITRITE NEGATIVE 07/11/2021 2245   LEUKOCYTESUR NEGATIVE 07/11/2021 2245   Sepsis Labs: @LABRCNTIP (procalcitonin:4,lacticidven:4) ) Recent Results (from the past 240 hour(s))  Resp Panel by RT-PCR (Flu A&B, Covid) Nasopharyngeal Swab     Status: None   Collection Time: 07/11/21  9:05 PM   Specimen: Nasopharyngeal Swab; Nasopharyngeal(NP) swabs in vial transport medium  Result Value Ref Range Status   SARS Coronavirus 2 by RT PCR NEGATIVE NEGATIVE Final    Comment: (NOTE) SARS-CoV-2 target nucleic acids are NOT DETECTED.  The SARS-CoV-2 RNA is generally detectable in upper respiratory specimens during the  acute phase of infection. The lowest concentration of SARS-CoV-2 viral copies this assay can detect is 138 copies/mL. A negative result does not preclude SARS-Cov-2 infection and should not be used as the sole basis for treatment or other patient management decisions. A negative result Mcnally occur with  improper specimen collection/handling, submission of specimen other than nasopharyngeal swab, presence of viral mutation(s) within the areas targeted by this assay, and inadequate number of viral copies(<138 copies/mL). A negative result must be combined with clinical observations, patient history, and epidemiological information. The expected result is Negative.  Fact Sheet for Patients:  EntrepreneurPulse.com.au  Fact Sheet for Healthcare Providers:  IncredibleEmployment.be  This test is no t yet approved or cleared by the [place]  FDA and  has been authorized for detection and/or diagnosis of SARS-CoV-2 by FDA under an Emergency Use Authorization (EUA). This EUA will remain  in effect (meaning this test can be used) for the duration of the COVID-19 declaration under Section 564(b)(1) of the Act, 21 U.S.C.section 360bbb-3(b)(1), unless the authorization is terminated  or revoked sooner.       Influenza A by PCR NEGATIVE NEGATIVE Final   Influenza B by PCR NEGATIVE NEGATIVE Final    Comment: (NOTE) The Xpert Xpress SARS-CoV-2/FLU/RSV plus assay is intended as an aid in the diagnosis of influenza from Nasopharyngeal swab specimens and should not be used as a sole basis for treatment. Nasal washings and aspirates are unacceptable for Xpert Xpress SARS-CoV-2/FLU/RSV testing.  Fact Sheet for Patients: EntrepreneurPulse.com.au  Fact Sheet for Healthcare Providers: IncredibleEmployment.be  This test is not yet approved or cleared by the [place] FDA and has been authorized for detection and/or diagnosis of SARS-CoV-2 by FDA under an Emergency Use Authorization (EUA). This EUA will remain in effect (meaning this test can be used) for the duration of the COVID-19 declaration under Section 564(b)(1) of the Act, 21 U.S.C. section 360bbb-3(b)(1), unless the authorization is terminated or revoked.  Performed at [facility], [address] [place], [place] [address]   Culture, blood (Routine x 2)     Status: None   Collection Time: 07/11/21  9:30 PM   Specimen: BLOOD  Result Value Ref Range Status   Specimen Description BLOOD LEFT ANTECUBITAL  Final   Special Requests   Final    BOTTLES DRAWN AEROBIC AND ANAEROBIC Blood Culture adequate volume   Culture   Final    NO GROWTH 5 DAYS Performed at [facility], [address], [place], [place] [address]    Report Status 07/16/2021 FINAL  Final  Culture, blood (Routine x 2)     Status: None   Collection Time: 07/11/21  9:30  PM   Specimen: BLOOD  Result Value Ref Range Status   Specimen Description BLOOD BLOOD LEFT HAND  Final   Special Requests   Final    BOTTLES DRAWN AEROBIC AND ANAEROBIC Blood Culture adequate volume   Culture   Final    NO GROWTH 5 DAYS Performed at [facility], [address] [place], [place] [address]    Report Status 07/16/2021 FINAL  Final  MRSA Next Gen by PCR, Nasal     Status: None   Collection Time: 07/12/21  5:00 AM  Result Value Ref Range Status   MRSA by PCR Next Gen NOT DETECTED NOT DETECTED Final    Comment: (NOTE) The GeneXpert MRSA Assay (FDA approved for NASAL specimens only), is one component of a comprehensive MRSA colonization surveillance program. It is not intended to  diagnose MRSA infection nor to guide or monitor treatment for MRSA infections. Test performance is not FDA approved in patients less than 14 years old. Performed at [facility], [address] [place], [place] [address]   Expectorated Sputum Assessment w Gram Stain, Rflx to Resp Cult     Status: None (Preliminary result)   Collection Time: 07/14/21  4:15 PM   Specimen: Expectorated Sputum  Result Value Ref Range Status   Specimen Description EXPECTORATED SPUTUM  Final   Special Requests NONE  Final   Sputum evaluation   Final    THIS SPECIMEN IS ACCEPTABLE FOR SPUTUM CULTURE Performed at [facility], [address] [place], [place] [address]    Report Status PENDING  Incomplete  Culture, Respiratory w Gram Stain     Status: None (Preliminary result)   Collection Time: 07/14/21  4:15 PM  Result Value Ref Range Status   Specimen Description   Final    EXPECTORATED SPUTUM Performed at [facility], [address] [place], [place] [address]    Special Requests   Final    NONE Reflexed from [id number] Performed at [facility], [address], [place], [place] [address]    Gram Stain   Final    NO SQUAMOUS EPITHELIAL CELLS SEEN FEW WBC SEEN FEW GRAM POSITIVE RODS MODERATE GRAM POSITIVE  COCCI    Culture   Final    TOO YOUNG TO READ Performed at [facility], [place] [address] [place], [place] [address]    Report Status PENDING  Incomplete     Scheduled Meds:  arformoterol  15 mcg Nebulization BID   budesonide (PULMICORT) nebulizer solution  0.5 mg Nebulization BID   calcium carbonate  1 tablet Oral Q breakfast   Chlorhexidine Gluconate Cloth  6 each Topical Daily   cloNIDine  0.1 mg Oral BID   dextromethorphan-guaiFENesin  1 tablet Oral BID   docusate sodium  100 mg Oral BID   enoxaparin (LOVENOX) injection  60 mg Subcutaneous Daily   ipratropium  0.5 mg Nebulization BID   levalbuterol  0.63 mg Nebulization BID   lidocaine  1 patch Transdermal Q24H   nicotine  21 mg Transdermal Daily   Continuous Infusions:  cefTRIAXone (ROCEPHIN)  IV Stopped (07/15/21 2208)    Procedures/Studies: CT ABDOMEN PELVIS WO CONTRAST  Result Date: 07/12/2021 CLINICAL DATA:  Abdominal pain and sepsis. Left-sided abdominal pain since last evening. History of prior appendectomy. EXAM: CT ABDOMEN AND PELVIS WITHOUT CONTRAST TECHNIQUE: Multidetector CT imaging of the abdomen and pelvis was performed following the standard protocol without IV contrast. COMPARISON:  Chest CT from yesterday. FINDINGS: Lower chest: Progressive dense left lower lobe airspace opacification most likely lobar pneumonia with an associated moderate-sized parapneumonic effusion, increased since yesterday's study. Progressive right basilar atelectasis. Hepatobiliary: No hepatic lesions or intrahepatic biliary dilatation. High attenuation material in the gallbladder likely vicarious excretion from the chest CT from yesterday. There appear to be some filling defects also suggesting gallstones. No CT findings to suggest acute cholecystitis. No common bile duct dilatation. Pancreas: No mass, inflammation or ductal dilatation. Spleen: Normal size.  No focal lesions. Adrenals/Urinary Tract: Adrenal glands and kidneys are  grossly normal. No renal or obstructing ureteral calculi. No bladder calculi. No mass lesions. The bladder contains high attenuation material likely excreted contrast material from yesterday CT scan. Stomach/Bowel: The stomach, duodenum, small bowel and colon are unremarkable. No acute inflammatory changes, mass lesions or obstructive findings. Vascular/Lymphatic: The aorta is normal in caliber. Minimal  scattered atheroscerlotic calcifications. No mesenteric of retroperitoneal mass or adenopathy. Small scattered lymph nodes are noted. Reproductive: The prostate gland and seminal vesicles are unremarkable. Other: Small periumbilical abdominal wall hernia containing fat. No free air or free fluid in the abdomen/pelvis. Musculoskeletal: No significant bony findings. IMPRESSION: 1. Progressive dense left lower lobe airspace opacification most likely lobar pneumonia with an associated moderate-sized parapneumonic effusion, increased since yesterday's study. 2. Progressive right basilar atelectasis. 3. No acute abdominal/pelvic findings, mass lesions or adenopathy. 4. Cholelithiasis. Electronically Signed   By: Marijo Sanes M.D.   On: 07/12/2021 14:48   DG Chest 2 View  Result Date: 07/11/2021 CLINICAL DATA:  Shortness of breath and oxygen desaturation. EXAM: CHEST - 2 VIEW COMPARISON:  PA Lat 02/16/2019. FINDINGS: The cardiac size is normal. Central vessels are normal caliber. There is interval low inspiration and development of a moderate layering left pleural effusion with overlying atelectasis or consolidation of the left lower lung field. The hypoexpanded right lung is clear. No pneumothorax is seen. Thoracic cage is intact. IMPRESSION: New finding of moderate left pleural effusion with overlying atelectasis or consolidation in left lower lung field. Clinical correlation and follow-up recommended. Electronically Signed   By: Telford Nab M.D.   On: 07/11/2021 22:04   CT Angio Chest PE W and/or Wo  Contrast  Result Date: 07/11/2021 CLINICAL DATA:  Shortness of breath x2 days. EXAM: CT ANGIOGRAPHY CHEST WITH CONTRAST TECHNIQUE: Multidetector CT imaging of the chest was performed using the standard protocol during bolus administration of intravenous contrast. Multiplanar CT image reconstructions and MIPs were obtained to evaluate the vascular anatomy. CONTRAST:  161mL OMNIPAQUE IOHEXOL 350 MG/ML SOLN COMPARISON:  None. FINDINGS: Cardiovascular: The subsegmental pulmonary arteries are limited in evaluation secondary to suboptimal opacification with intravenous contrast as well as areas of overlying artifact. No evidence of pulmonary embolism. Normal heart size with mild coronary artery calcification. No pericardial effusion. Mediastinum/Nodes: No enlarged mediastinal, hilar, or axillary lymph nodes. Thyroid gland, trachea, and esophagus demonstrate no significant findings. Lungs/Pleura: Moderate to marked severity atelectasis and/or infiltrate is seen within the left lower lobe and along the periphery of the left upper lobe. Mild posterior right basilar atelectasis and/or infiltrate is also seen. A small to moderate size left-sided pleural effusion is seen. No pneumothorax is identified. Upper Abdomen: No acute abnormality. Musculoskeletal: No chest wall abnormality. No acute or significant osseous findings. Review of the MIP images confirms the above findings. IMPRESSION: 1. Moderate to marked severity left lower lobe and left upper lobe atelectasis and/or infiltrate. 2. Mild posterior right basilar atelectasis and/or infiltrate. 3. Small to moderate size left-sided pleural effusion. 4. Limited evaluation of the pulmonary arteries, as described above, without definite evidence of pulmonary embolism. Electronically Signed   By: Virgina Norfolk M.D.   On: 07/11/2021 23:14   [place] CHEST (PLEURAL EFFUSION)  Result Date: 07/12/2021 CLINICAL DATA:  Left pleural effusion. EXAM: CHEST ULTRASOUND COMPARISON:   July 11, 2021. FINDINGS: Sonographic evaluation of the left hemithorax demonstrates small and somewhat complex appearing left pleural effusion. Adequate fluid pocket for thoracentesis is not visualized. IMPRESSION: Small and somewhat complex appearing left pleural effusion is noted. Electronically Signed   By: Marijo Conception M.D.   On: 07/12/2021 10:38   DG CHEST PORT 1 VIEW  Result Date: 07/16/2021 CLINICAL DATA:  Pneumonia EXAM: PORTABLE CHEST 1 VIEW COMPARISON:  Chest x-ray 07/14/2021 FINDINGS: Heart size appears normal. Mediastinum is within normal limits. Right lung and pleura remain clear. Interval improving aeration  of the left upper lung zone and decreased size of a now moderate left pleural effusion. No pneumothorax. IMPRESSION: Improvement since previous study. Persistent moderate left pleural effusion with associated atelectasis/infiltrate. Electronically Signed   By: Ofilia Neas M.D.   On: 07/16/2021 08:20   DG Chest Port 1 View  Result Date: 07/14/2021 CLINICAL DATA:  Shortness of breath, ICU EXAM: PORTABLE CHEST 1 VIEW COMPARISON:  Chest radiograph dated 1 day prior FINDINGS: The cardiomediastinal silhouette is suboptimally evaluated. A large left pleural effusion with near complete opacification of the left hemithorax is again seen with a small amount of aerated left upper lobe, not significantly changed compared to the study from 1 day prior. There is a possible trace right pleural effusion. The right lung otherwise remains clear. There is no right effusion. There is no pneumothorax. The bones are stable. IMPRESSION: 1. Large left pleural effusion with a small amount of aerated left upper lobe is not significantly changed compared to the study from 1 day prior. 2. Unchanged possible trace right pleural effusion. Electronically Signed   By: Valetta Mole M.D.   On: 07/14/2021 10:45   DG Chest Port 1 View  Result Date: 07/13/2021 CLINICAL DATA:  Shortness of breath, left  pleural effusion EXAM: PORTABLE CHEST 1 VIEW COMPARISON:  Previous studies including the examination of 07/12/2021 FINDINGS: There is further increase in size of left pleural effusion with almost complete opacification. There is decreased aeration in the left lung with small area of aeration in the left upper lung fields. Increased density in the right lower lung fields Espejo be due to pleural effusion and possibly underlying atelectasis. There is no pneumothorax. IMPRESSION: There is increase in amount of large left pleural effusion with almost complete atelectasis of left lung with small residual area of aeration in the left upper lung fields. Increased density in the right lower lung fields suggests small effusion and possibly underlying atelectasis with slight improvement. Electronically Signed   By: Elmer Picker M.D.   On: 07/13/2021 08:11   DG CHEST PORT 1 VIEW  Result Date: 07/12/2021 CLINICAL DATA:  Increased shortness of breath EXAM: PORTABLE CHEST 1 VIEW COMPARISON:  Chest x-ray dated July 11, 2021 FINDINGS: Visualized cardiac and mediastinal contours are unchanged. Small right and large left pleural effusions with associated atelectasis, increased compared to prior exam. New rightward deviation of the trachea. No evidence of pneumothorax. IMPRESSION: 1. Small right and large left pleural effusions with associated atelectasis, increased compared to prior exam. 2. New rightward deviation of the trachea, likely related to large left pleural effusion. Electronically Signed   By: Yetta Glassman M.D.   On: 07/12/2021 14:05    Kathie Dike, MD  [facility]  If 7PM-7AM, please contact night-coverage [email or web address]  07/16/2021, 7:17 PM   LOS: 4 days

## 2021-07-16 NOTE — Progress Notes (Signed)
Had patient on 7L [place] and had to go up to 12L due to patient desat while sleeping.  Patient is currently at 93%.  RN aware.

## 2021-07-16 NOTE — Progress Notes (Signed)
Pt has been weaned down to 7L HFNC since around 1045 this morning. Tolerating well, sat's have been in the low 90's. Will continue to monitor.

## 2021-07-17 DIAGNOSIS — J9601 Acute respiratory failure with hypoxia: Secondary | ICD-10-CM | POA: Diagnosis not present

## 2021-07-17 DIAGNOSIS — J189 Pneumonia, unspecified organism: Secondary | ICD-10-CM | POA: Diagnosis not present

## 2021-07-17 DIAGNOSIS — J181 Lobar pneumonia, unspecified organism: Secondary | ICD-10-CM | POA: Diagnosis not present

## 2021-07-17 DIAGNOSIS — I10 Essential (primary) hypertension: Secondary | ICD-10-CM | POA: Diagnosis not present

## 2021-07-17 LAB — CBC
HCT: 37.1 % — ABNORMAL LOW (ref 39.0–52.0)
Hemoglobin: 12.2 g/dL — ABNORMAL LOW (ref 13.0–17.0)
MCH: 29.8 pg (ref 26.0–34.0)
MCHC: 32.9 g/dL (ref 30.0–36.0)
MCV: 90.5 fL (ref 80.0–100.0)
Platelets: 391 10*3/uL (ref 150–400)
RBC: 4.1 MIL/uL — ABNORMAL LOW (ref 4.22–5.81)
RDW: 14.4 % (ref 11.5–15.5)
WBC: 22.4 10*3/uL — ABNORMAL HIGH (ref 4.0–10.5)
nRBC: 0 % (ref 0.0–0.2)

## 2021-07-17 LAB — COMPREHENSIVE METABOLIC PANEL
ALT: 267 U/L — ABNORMAL HIGH (ref 0–44)
AST: 100 U/L — ABNORMAL HIGH (ref 15–41)
Albumin: 2.2 g/dL — ABNORMAL LOW (ref 3.5–5.0)
Alkaline Phosphatase: 173 U/L — ABNORMAL HIGH (ref 38–126)
Anion gap: 7 (ref 5–15)
BUN: 16 mg/dL (ref 6–20)
CO2: 30 mmol/L (ref 22–32)
Calcium: 8.2 mg/dL — ABNORMAL LOW (ref 8.9–10.3)
Chloride: 102 mmol/L (ref 98–111)
Creatinine, Ser: 0.75 mg/dL (ref 0.61–1.24)
GFR, Estimated: >60 mL/min (ref >60)
Glucose, Bld: 160 mg/dL — ABNORMAL HIGH (ref 70–99)
Potassium: 3.9 mmol/L (ref 3.5–5.1)
Sodium: 139 mmol/L (ref 135–145)
Total Bilirubin: 0.2 mg/dL — ABNORMAL LOW (ref 0.3–1.2)
Total Protein: 6.2 g/dL — ABNORMAL LOW (ref 6.5–8.1)

## 2021-07-17 LAB — SEDIMENTATION RATE: Sed Rate: 83 mm/hr — ABNORMAL HIGH (ref 0–16)

## 2021-07-17 LAB — CULTURE, RESPIRATORY W GRAM STAIN
Culture: NORMAL
Gram Stain: NONE SEEN

## 2021-07-17 LAB — BRAIN NATRIURETIC PEPTIDE: B Natriuretic Peptide: 107 pg/mL — ABNORMAL HIGH (ref 0.0–100.0)

## 2021-07-17 MED ORDER — SODIUM CHLORIDE 0.9 % IV SOLN
2.0000 g | INTRAVENOUS | Status: DC
  Administered 2021-07-17 – 2021-07-18 (×2): 2 g via INTRAVENOUS
  Filled 2021-07-17 (×2): qty 20

## 2021-07-17 MED ORDER — OXYCODONE-ACETAMINOPHEN 5-325 MG PO TABS
1.0000 | ORAL_TABLET | ORAL | Status: DC | PRN
  Administered 2021-07-17: 1 via ORAL
  Filled 2021-07-17: qty 1

## 2021-07-17 MED ORDER — SALINE SPRAY 0.65 % NA SOLN
1.0000 | NASAL | Status: DC | PRN
  Administered 2021-07-17: 13:00:00 1 via NASAL
  Filled 2021-07-17: qty 44

## 2021-07-17 MED ORDER — FUROSEMIDE 10 MG/ML IJ SOLN
20.0000 mg | Freq: Once | INTRAMUSCULAR | Status: AC
  Administered 2021-07-17: 20 mg via INTRAVENOUS
  Filled 2021-07-17: qty 2

## 2021-07-17 MED ORDER — OXYCODONE HCL 5 MG PO TABS
5.0000 mg | ORAL_TABLET | ORAL | Status: DC | PRN
  Administered 2021-07-18 (×4): 10 mg via ORAL
  Administered 2021-07-19: 5 mg via ORAL
  Administered 2021-07-19: 10 mg via ORAL
  Administered 2021-07-20 – 2021-07-21 (×3): 5 mg via ORAL
  Administered 2021-07-21 – 2021-07-23 (×10): 10 mg via ORAL
  Filled 2021-07-17 (×2): qty 1
  Filled 2021-07-17 (×4): qty 2
  Filled 2021-07-17: qty 1
  Filled 2021-07-17 (×5): qty 2
  Filled 2021-07-17: qty 1
  Filled 2021-07-17 (×6): qty 2

## 2021-07-17 NOTE — Progress Notes (Signed)
PROGRESS NOTE  Victor [name] JQB:[id number] DOB: Mar 15, 1976 DOA: 07/11/2021 PCP: [name], DO   Brief History:  45 year old male with a history of hypertension, hyperlipidemia, tobacco abuse, GERD, chronic knee pain status post right TKA and multiple revisions presenting with shortness of breath and left-sided chest pain.  The patient states that his shortness of breath began on the evening of 07/05/2021.  He has had a nonproductive cough.  He denies any fevers, chills, headache, neck pain, nausea, vomiting, diarrhea, abdominal pain.  However he has had some sharp pleuritic type left-sided chest pain rating down to his left flank and left upper quadrant.  He has not had any diarrhea, hematochezia, melena.  He denies any sick contacts.  He denies any new medications.  He continues to smoke 1 pack a day.  Patient went to urgent care on the morning of 07/11/2021.  However, he had worsening shortness of breath when he got home resulting in activation of EMS.  He was not able to pick up his prescriptions prescribed at urgent care including antibiotics and the steroids. The emergency department, the patient was afebrile but tachycardic up to 110 with oxygen saturation 90-94% on 6 L.  He was started on Rocephin and azithromycin.  CTA chest was negative for PE but showed moderate to severe atelectasis/infiltrate of the left lower lobe and left upper lobe with small to moderate pleural effusion.   Assessment/Plan: Sepsis -Present on admission -Presented with leukocytosis, tachycardia, tachypnea into the 30s -Secondary to pneumonia -Lactic acid peaked 1.6 -PCT 4.98>>3.14 -Continue IV antibiotics   Acute respiratory failure with hypoxia -Presented with saturation 86% on room air and tachypnea to the 30s -Secondary to pneumonia and left pleural effusion -Currently down to 6L HFNC -Wean oxygen as tolerated for saturation greater 90% -Appreciate PCCM assistance -since he has been  having periods of nocturnal desaturations, he will need sleep study once he is over acute illness   Left pleural effusion/pleuritic CP -Request thoracocentesis--not enough fluid to perform -multiple bedside US performed by Dr. Fanny Dance significant fluid -continue steroids -continue IV dilaudid>>1 mg IV q 2 prn -11/18--personally reviewed CXR--progressive opacification of Left hemithorax -X-ray from 11/21 shows improving aeration on the left with persistent pleural effusion -discussed with Dr. Melvyn Novas and it was felt that patient Racz benefit from transferring to [facility] to be seen by Pulmonology/IR for placement of pigtail catheter with lytics for loculated effusion -discussed with patient and he wishes to repeat chest xray in the morning and see how he feels in the morning before deciding on whether he wishes to transfer to [facility]    Lobar pneumonia -Continue ceftriaxone -completed 5 days of azithromycin -Continue Xopenex and Atrovent -continue pulmicort and arformoterol -Urinary antigens for pneumococcus and Legionella are negative -Sputum culture shows normal resp flora   Hypertension -Amlodipine discontinued and favor of low-dose clonidine, since amlodipine can interfere with VQ matching and areas of lung disease -BP currently stable   Tobacco abuse -Tobacco cessation discussed -He has a 30-pack-year history   Chronic Right knee pain -PDMP reviewed -he receives monthly percocet 7.5 and pregabalin   Hypokalemia -replete -check mag--2.0   Abdominal pain -CT abd/pelvis--no acute intraabdominal findings  Elevated LFTs -no abdominal pain or vomiting -checking RUQ ultrasound -repeat labs in AM           Status is: Inpatient   Remains inpatient appropriate because: severity of illness requiring high flow oxygen, IV antibiotics  Family Communication:   Family updated at bedside 11/22   Consultants: PCCM   Code Status:  FULL    DVT Prophylaxis:  [place]  Lovenox     Procedures: As Listed in Progress Note Above   Antibiotics: Ceftriaxone 11/16>> Azithro 11/16>>11/21      Subjective: Still has shortness of breath. Continuing to have productive cough. Noted by staff to have episodes of hypoxia overnight  Objective: Vitals:   07/17/21 1914 07/17/21 2000 07/17/21 2033 07/17/21 2127  BP: 134/77 125/85  133/85  Pulse: 73 73    Resp: 18 (!) 21    Temp:      TempSrc:      SpO2: 94% 94% 96%   Weight:      Height:        Intake/Output Summary (Last 24 hours) at 07/17/2021 2255 Last data filed at 07/17/2021 1734 Gross per 24 hour  Intake --  Output 1800 ml  Net -1800 ml   Weight change:  Exam:  General exam: Alert, awake, oriented x 3 Respiratory system: Improving aeration on the left, crackles on the left, increased respiratory effort Cardiovascular system:RRR. No murmurs, rubs, gallops. Gastrointestinal system: Abdomen is nondistended, soft and nontender. No organomegaly or masses felt. Normal bowel sounds heard. Central nervous system: Alert and oriented. No focal neurological deficits. Extremities: 1+ pedal edema b/l +pedal pulses Skin: No rashes, lesions or ulcers Psychiatry: Judgement and insight appear normal. Mood & affect appropriate.      Data Reviewed: I have personally reviewed following labs and imaging studies Basic Metabolic Panel: Recent Labs  Lab 07/11/21 2130 07/12/21 0331 07/13/21 0447 07/14/21 0449 07/17/21 0428  NA 135 134* 135 136 139  K 3.2* 3.4* 3.8 3.7 3.9  CL 97* 100 101 101 102  CO2 26 25 25 26 30   GLUCOSE 129* 158* 161* 149* 160*  BUN 14 12 11 15 16   CREATININE 0.90 0.90 0.70 0.78 0.75  CALCIUM 8.8* 8.1* 8.4* 8.5* 8.2*  MG  --  1.7 2.0  --   --   PHOS  --  3.9  --   --   --    Liver Function Tests: Recent Labs  Lab 07/11/21 2130 07/12/21 0331 07/17/21 0428  AST 22 26 100*  ALT 23 28 267*  ALKPHOS 101 98 173*  BILITOT 0.6 0.7 0.2*  PROT 8.2* 7.0 6.2*  ALBUMIN 3.8 3.2*  2.2*   No results for input(s): LIPASE, AMYLASE in the last 168 hours. No results for input(s): AMMONIA in the last 168 hours. Coagulation Profile: Recent Labs  Lab 07/11/21 2130  INR 1.0   CBC: Recent Labs  Lab 07/11/21 2130 07/12/21 0443 07/13/21 0447 07/14/21 0449 07/17/21 0428  WBC 15.8* 19.3* 21.9* 25.0* 22.4*  NEUTROABS 13.5*  --   --   --   --   HGB 14.6 13.8 13.5 13.0 12.2*  HCT 43.3 42.2 40.6 40.0 37.1*  MCV 88.9 91.5 90.6 91.1 90.5  PLT 388 383 372 392 391   Cardiac Enzymes: No results for input(s): CKTOTAL, CKMB, CKMBINDEX, TROPONINI in the last 168 hours. BNP: Invalid input(s): POCBNP CBG: No results for input(s): GLUCAP in the last 168 hours. HbA1C: No results for input(s): HGBA1C in the last 72 hours. Urine analysis:    Component Value Date/Time   COLORURINE YELLOW 07/11/2021 [place] 07/11/2021 2245   LABSPEC 1.021 07/11/2021 2245   PHURINE 5.0 07/11/2021 2245   GLUCOSEU NEGATIVE 07/11/2021 2245   HGBUR MODERATE (A) 07/11/2021  [place] 07/11/2021 [place] 07/11/2021 2245   PROTEINUR 30 (A) 07/11/2021 2245   NITRITE NEGATIVE 07/11/2021 2245   LEUKOCYTESUR NEGATIVE 07/11/2021 2245   Sepsis Labs: @LABRCNTIP (procalcitonin:4,lacticidven:4) ) Recent Results (from the past 240 hour(s))  Resp Panel by RT-PCR (Flu A&B, Covid) Nasopharyngeal Swab     Status: None   Collection Time: 07/11/21  9:05 PM   Specimen: Nasopharyngeal Swab; Nasopharyngeal(NP) swabs in vial transport medium  Result Value Ref Range Status   SARS Coronavirus 2 by RT PCR NEGATIVE NEGATIVE Final    Comment: (NOTE) SARS-CoV-2 target nucleic acids are NOT DETECTED.  The SARS-CoV-2 RNA is generally detectable in upper respiratory specimens during the acute phase of infection. The lowest concentration of SARS-CoV-2 viral copies this assay can detect is 138 copies/mL. A negative result does not preclude SARS-Cov-2 infection and  should not be used as the sole basis for treatment or other patient management decisions. A negative result Maciver occur with  improper specimen collection/handling, submission of specimen other than nasopharyngeal swab, presence of viral mutation(s) within the areas targeted by this assay, and inadequate number of viral copies(<138 copies/mL). A negative result must be combined with clinical observations, patient history, and epidemiological information. The expected result is Negative.  Fact Sheet for Patients:  EntrepreneurPulse.com.au  Fact Sheet for Healthcare Providers:  IncredibleEmployment.be  This test is no t yet approved or cleared by the [place] FDA and  has been authorized for detection and/or diagnosis of SARS-CoV-2 by FDA under an Emergency Use Authorization (EUA). This EUA will remain  in effect (meaning this test can be used) for the duration of the COVID-19 declaration under Section 564(b)(1) of the Act, 21 U.S.C.section 360bbb-3(b)(1), unless the authorization is terminated  or revoked sooner.       Influenza A by PCR NEGATIVE NEGATIVE Final   Influenza B by PCR NEGATIVE NEGATIVE Final    Comment: (NOTE) The Xpert Xpress SARS-CoV-2/FLU/RSV plus assay is intended as an aid in the diagnosis of influenza from Nasopharyngeal swab specimens and should not be used as a sole basis for treatment. Nasal washings and aspirates are unacceptable for Xpert Xpress SARS-CoV-2/FLU/RSV testing.  Fact Sheet for Patients: EntrepreneurPulse.com.au  Fact Sheet for Healthcare Providers: IncredibleEmployment.be  This test is not yet approved or cleared by the [place] FDA and has been authorized for detection and/or diagnosis of SARS-CoV-2 by FDA under an Emergency Use Authorization (EUA). This EUA will remain in effect (meaning this test can be used) for the duration of the COVID-19 declaration  under Section 564(b)(1) of the Act, 21 U.S.C. section 360bbb-3(b)(1), unless the authorization is terminated or revoked.  Performed at [facility], [address], [place], [place] [address]   Culture, blood (Routine x 2)     Status: None   Collection Time: 07/11/21  9:30 PM   Specimen: BLOOD  Result Value Ref Range Status   Specimen Description BLOOD LEFT ANTECUBITAL  Final   Special Requests   Final    BOTTLES DRAWN AEROBIC AND ANAEROBIC Blood Culture adequate volume   Culture   Final    NO GROWTH 5 DAYS Performed at [facility], [address] [place], [place] [address]    Report Status 07/16/2021 FINAL  Final  Culture, blood (Routine x 2)     Status: None   Collection Time: 07/11/21  9:30 PM   Specimen: BLOOD  Result Value Ref Range Status   Specimen Description BLOOD BLOOD LEFT HAND  Final   Special Requests   Final    BOTTLES DRAWN AEROBIC AND ANAEROBIC Blood Culture adequate volume   Culture   Final    NO GROWTH 5 DAYS Performed at [facility], [address], [place], [place] [address]    Report Status 07/16/2021 FINAL  Final  MRSA Next Gen by PCR, Nasal     Status: None   Collection Time: 07/12/21  5:00 AM  Result Value Ref Range Status   MRSA by PCR Next Gen NOT DETECTED NOT DETECTED Final    Comment: (NOTE) The GeneXpert MRSA Assay (FDA approved for NASAL specimens only), is one component of a comprehensive MRSA colonization surveillance program. It is not intended to diagnose MRSA infection nor to guide or monitor treatment for MRSA infections. Test performance is not FDA approved in patients less than 78 years old. Performed at [facility], [address], [place], [place] [address]   Expectorated Sputum Assessment w Gram Stain, Rflx to Resp Cult     Status: None   Collection Time: 07/14/21  4:15 PM   Specimen: Expectorated Sputum  Result Value Ref Range Status   Specimen Description EXPECTORATED SPUTUM  Final   Special Requests NONE  Final   Sputum  evaluation   Final    THIS SPECIMEN IS ACCEPTABLE FOR SPUTUM CULTURE Performed at [facility], [address] [place], [place] [address]    Report Status 07/16/2021 FINAL  Final  Culture, Respiratory w Gram Stain     Status: None   Collection Time: 07/14/21  4:15 PM  Result Value Ref Range Status   Specimen Description   Final    EXPECTORATED SPUTUM Performed at [facility], [address], [place], [place] [address]    Special Requests   Final    NONE Reflexed from [id number] Performed at [facility], [address], [place], [place] [address]    Gram Stain   Final    NO SQUAMOUS EPITHELIAL CELLS SEEN FEW WBC SEEN FEW GRAM POSITIVE RODS MODERATE GRAM POSITIVE COCCI    Culture   Final    ABUNDANT Normal respiratory flora-no Staph aureus or Pseudomonas seen Performed at [facility], [place] [address] [place], [place] [address]    Report Status 07/17/2021 FINAL  Final     Scheduled Meds:  arformoterol  15 mcg Nebulization BID   budesonide (PULMICORT) nebulizer solution  0.5 mg Nebulization BID   calcium carbonate  1 tablet Oral Q breakfast   Chlorhexidine Gluconate Cloth  6 each Topical Daily   cloNIDine  0.1 mg Oral BID   dextromethorphan-guaiFENesin  1 tablet Oral BID   docusate sodium  100 mg Oral BID   enoxaparin (LOVENOX) injection  60 mg Subcutaneous Daily   ipratropium  0.5 mg Nebulization BID   levalbuterol  0.63 mg Nebulization BID   lidocaine  1 patch Transdermal Q24H   nicotine  21 mg Transdermal Daily   Continuous Infusions:  cefTRIAXone (ROCEPHIN)  IV 2 g (07/17/21 2127)    Procedures/Studies: CT ABDOMEN PELVIS WO CONTRAST  Result Date: 07/12/2021 CLINICAL DATA:  Abdominal pain and sepsis. Left-sided abdominal pain since last evening. History of prior appendectomy. EXAM: CT ABDOMEN AND PELVIS WITHOUT CONTRAST TECHNIQUE: Multidetector CT imaging of the abdomen and pelvis was performed following the standard protocol without IV contrast. COMPARISON:   Chest CT from yesterday. FINDINGS: Lower chest: Progressive dense left lower lobe airspace opacification most likely lobar pneumonia with an associated moderate-sized parapneumonic effusion, increased since yesterday's  study. Progressive right basilar atelectasis. Hepatobiliary: No hepatic lesions or intrahepatic biliary dilatation. High attenuation material in the gallbladder likely vicarious excretion from the chest CT from yesterday. There appear to be some filling defects also suggesting gallstones. No CT findings to suggest acute cholecystitis. No common bile duct dilatation. Pancreas: No mass, inflammation or ductal dilatation. Spleen: Normal size.  No focal lesions. Adrenals/Urinary Tract: Adrenal glands and kidneys are grossly normal. No renal or obstructing ureteral calculi. No bladder calculi. No mass lesions. The bladder contains high attenuation material likely excreted contrast material from yesterday CT scan. Stomach/Bowel: The stomach, duodenum, small bowel and colon are unremarkable. No acute inflammatory changes, mass lesions or obstructive findings. Vascular/Lymphatic: The aorta is normal in caliber. Minimal scattered atheroscerlotic calcifications. No mesenteric of retroperitoneal mass or adenopathy. Small scattered lymph nodes are noted. Reproductive: The prostate gland and seminal vesicles are unremarkable. Other: Small periumbilical abdominal wall hernia containing fat. No free air or free fluid in the abdomen/pelvis. Musculoskeletal: No significant bony findings. IMPRESSION: 1. Progressive dense left lower lobe airspace opacification most likely lobar pneumonia with an associated moderate-sized parapneumonic effusion, increased since yesterday's study. 2. Progressive right basilar atelectasis. 3. No acute abdominal/pelvic findings, mass lesions or adenopathy. 4. Cholelithiasis. Electronically Signed   By: Marijo Sanes M.D.   On: 07/12/2021 14:48   DG Chest 2 View  Result Date:  07/11/2021 CLINICAL DATA:  Shortness of breath and oxygen desaturation. EXAM: CHEST - 2 VIEW COMPARISON:  PA Lat 02/16/2019. FINDINGS: The cardiac size is normal. Central vessels are normal caliber. There is interval low inspiration and development of a moderate layering left pleural effusion with overlying atelectasis or consolidation of the left lower lung field. The hypoexpanded right lung is clear. No pneumothorax is seen. Thoracic cage is intact. IMPRESSION: New finding of moderate left pleural effusion with overlying atelectasis or consolidation in left lower lung field. Clinical correlation and follow-up recommended. Electronically Signed   By: Telford Nab M.D.   On: 07/11/2021 22:04   CT Angio Chest PE W and/or Wo Contrast  Result Date: 07/11/2021 CLINICAL DATA:  Shortness of breath x2 days. EXAM: CT ANGIOGRAPHY CHEST WITH CONTRAST TECHNIQUE: Multidetector CT imaging of the chest was performed using the standard protocol during bolus administration of intravenous contrast. Multiplanar CT image reconstructions and MIPs were obtained to evaluate the vascular anatomy. CONTRAST:  168mL OMNIPAQUE IOHEXOL 350 MG/ML SOLN COMPARISON:  None. FINDINGS: Cardiovascular: The subsegmental pulmonary arteries are limited in evaluation secondary to suboptimal opacification with intravenous contrast as well as areas of overlying artifact. No evidence of pulmonary embolism. Normal heart size with mild coronary artery calcification. No pericardial effusion. Mediastinum/Nodes: No enlarged mediastinal, hilar, or axillary lymph nodes. Thyroid gland, trachea, and esophagus demonstrate no significant findings. Lungs/Pleura: Moderate to marked severity atelectasis and/or infiltrate is seen within the left lower lobe and along the periphery of the left upper lobe. Mild posterior right basilar atelectasis and/or infiltrate is also seen. A small to moderate size left-sided pleural effusion is seen. No pneumothorax is  identified. Upper Abdomen: No acute abnormality. Musculoskeletal: No chest wall abnormality. No acute or significant osseous findings. Review of the MIP images confirms the above findings. IMPRESSION: 1. Moderate to marked severity left lower lobe and left upper lobe atelectasis and/or infiltrate. 2. Mild posterior right basilar atelectasis and/or infiltrate. 3. Small to moderate size left-sided pleural effusion. 4. Limited evaluation of the pulmonary arteries, as described above, without definite evidence of pulmonary embolism. Electronically Signed   By: Hoover Browns  Houston M.D.   On: 07/11/2021 23:14   [place] CHEST (PLEURAL EFFUSION)  Result Date: 07/12/2021 CLINICAL DATA:  Left pleural effusion. EXAM: CHEST ULTRASOUND COMPARISON:  July 11, 2021. FINDINGS: Sonographic evaluation of the left hemithorax demonstrates small and somewhat complex appearing left pleural effusion. Adequate fluid pocket for thoracentesis is not visualized. IMPRESSION: Small and somewhat complex appearing left pleural effusion is noted. Electronically Signed   By: Marijo Conception M.D.   On: 07/12/2021 10:38   DG CHEST PORT 1 VIEW  Result Date: 07/16/2021 CLINICAL DATA:  Pneumonia EXAM: PORTABLE CHEST 1 VIEW COMPARISON:  Chest x-ray 07/14/2021 FINDINGS: Heart size appears normal. Mediastinum is within normal limits. Right lung and pleura remain clear. Interval improving aeration of the left upper lung zone and decreased size of a now moderate left pleural effusion. No pneumothorax. IMPRESSION: Improvement since previous study. Persistent moderate left pleural effusion with associated atelectasis/infiltrate. Electronically Signed   By: Ofilia Neas M.D.   On: 07/16/2021 08:20   DG Chest Port 1 View  Result Date: 07/14/2021 CLINICAL DATA:  Shortness of breath, ICU EXAM: PORTABLE CHEST 1 VIEW COMPARISON:  Chest radiograph dated 1 day prior FINDINGS: The cardiomediastinal silhouette is suboptimally evaluated. A large left  pleural effusion with near complete opacification of the left hemithorax is again seen with a small amount of aerated left upper lobe, not significantly changed compared to the study from 1 day prior. There is a possible trace right pleural effusion. The right lung otherwise remains clear. There is no right effusion. There is no pneumothorax. The bones are stable. IMPRESSION: 1. Large left pleural effusion with a small amount of aerated left upper lobe is not significantly changed compared to the study from 1 day prior. 2. Unchanged possible trace right pleural effusion. Electronically Signed   By: Valetta Mole M.D.   On: 07/14/2021 10:45   DG Chest Port 1 View  Result Date: 07/13/2021 CLINICAL DATA:  Shortness of breath, left pleural effusion EXAM: PORTABLE CHEST 1 VIEW COMPARISON:  Previous studies including the examination of 07/12/2021 FINDINGS: There is further increase in size of left pleural effusion with almost complete opacification. There is decreased aeration in the left lung with small area of aeration in the left upper lung fields. Increased density in the right lower lung fields Gonser be due to pleural effusion and possibly underlying atelectasis. There is no pneumothorax. IMPRESSION: There is increase in amount of large left pleural effusion with almost complete atelectasis of left lung with small residual area of aeration in the left upper lung fields. Increased density in the right lower lung fields suggests small effusion and possibly underlying atelectasis with slight improvement. Electronically Signed   By: Elmer Picker M.D.   On: 07/13/2021 08:11   DG CHEST PORT 1 VIEW  Result Date: 07/12/2021 CLINICAL DATA:  Increased shortness of breath EXAM: PORTABLE CHEST 1 VIEW COMPARISON:  Chest x-ray dated July 11, 2021 FINDINGS: Visualized cardiac and mediastinal contours are unchanged. Small right and large left pleural effusions with associated atelectasis, increased compared to  prior exam. New rightward deviation of the trachea. No evidence of pneumothorax. IMPRESSION: 1. Small right and large left pleural effusions with associated atelectasis, increased compared to prior exam. 2. New rightward deviation of the trachea, likely related to large left pleural effusion. Electronically Signed   By: Yetta Glassman M.D.   On: 07/12/2021 14:05    Kathie Dike, MD  [facility]  If 7PM-7AM, please contact night-coverage [email or web address]  07/17/2021,  10:55 PM   LOS: 5 days

## 2021-07-18 ENCOUNTER — Inpatient Hospital Stay (HOSPITAL_COMMUNITY): Payer: Medicaid Other

## 2021-07-18 ENCOUNTER — Encounter (HOSPITAL_COMMUNITY): Payer: Self-pay | Admitting: Radiology

## 2021-07-18 DIAGNOSIS — I10 Essential (primary) hypertension: Secondary | ICD-10-CM | POA: Diagnosis not present

## 2021-07-18 DIAGNOSIS — J9601 Acute respiratory failure with hypoxia: Secondary | ICD-10-CM | POA: Diagnosis not present

## 2021-07-18 DIAGNOSIS — R7989 Other specified abnormal findings of blood chemistry: Secondary | ICD-10-CM

## 2021-07-18 DIAGNOSIS — J90 Pleural effusion, not elsewhere classified: Secondary | ICD-10-CM | POA: Diagnosis not present

## 2021-07-18 DIAGNOSIS — J181 Lobar pneumonia, unspecified organism: Secondary | ICD-10-CM | POA: Diagnosis not present

## 2021-07-18 DIAGNOSIS — J189 Pneumonia, unspecified organism: Secondary | ICD-10-CM | POA: Diagnosis not present

## 2021-07-18 LAB — CBC
HCT: 40.1 % (ref 39.0–52.0)
Hemoglobin: 13.1 g/dL (ref 13.0–17.0)
MCH: 29.8 pg (ref 26.0–34.0)
MCHC: 32.7 g/dL (ref 30.0–36.0)
MCV: 91.1 fL (ref 80.0–100.0)
Platelets: 375 10*3/uL (ref 150–400)
RBC: 4.4 MIL/uL (ref 4.22–5.81)
RDW: 14.5 % (ref 11.5–15.5)
WBC: 14.8 10*3/uL — ABNORMAL HIGH (ref 4.0–10.5)
nRBC: 0.1 % (ref 0.0–0.2)

## 2021-07-18 LAB — COMPREHENSIVE METABOLIC PANEL
ALT: 253 U/L — ABNORMAL HIGH (ref 0–44)
AST: 81 U/L — ABNORMAL HIGH (ref 15–41)
Albumin: 2.2 g/dL — ABNORMAL LOW (ref 3.5–5.0)
Alkaline Phosphatase: 180 U/L — ABNORMAL HIGH (ref 38–126)
Anion gap: 8 (ref 5–15)
BUN: 18 mg/dL (ref 6–20)
CO2: 31 mmol/L (ref 22–32)
Calcium: 8.1 mg/dL — ABNORMAL LOW (ref 8.9–10.3)
Chloride: 101 mmol/L (ref 98–111)
Creatinine, Ser: 0.86 mg/dL (ref 0.61–1.24)
GFR, Estimated: >60 mL/min (ref >60)
Glucose, Bld: 100 mg/dL — ABNORMAL HIGH (ref 70–99)
Potassium: 3.9 mmol/L (ref 3.5–5.1)
Sodium: 140 mmol/L (ref 135–145)
Total Bilirubin: 0.3 mg/dL (ref 0.3–1.2)
Total Protein: 6.3 g/dL — ABNORMAL LOW (ref 6.5–8.1)

## 2021-07-18 MED ORDER — IOHEXOL 300 MG/ML  SOLN
75.0000 mL | Freq: Once | INTRAMUSCULAR | Status: AC | PRN
  Administered 2021-07-18: 75 mL via INTRAVENOUS

## 2021-07-18 NOTE — Progress Notes (Signed)
PROGRESS NOTE  Victor [name] WNI:[id number] DOB: 01/13/1976 DOA: 07/11/2021 PCP: [name], DO    Brief History:  45 year old male with a history of hypertension, hyperlipidemia, tobacco abuse, GERD, chronic knee pain status post right TKA and multiple revisions presenting with shortness of breath and left-sided chest pain.  The patient states that his shortness of breath began on the evening of 07/05/2021.  He has had a nonproductive cough.  He denies any fevers, chills, headache, neck pain, nausea, vomiting, diarrhea, abdominal pain.  However he has had some sharp pleuritic type left-sided chest pain rating down to his left flank and left upper quadrant.  He has not had any diarrhea, hematochezia, melena.  He denies any sick contacts.  He denies any new medications.  He continues to smoke 1 pack a day.  Patient went to urgent care on the morning of 07/11/2021.  However, he had worsening shortness of breath when he got home resulting in activation of EMS.  He was not able to pick up his prescriptions prescribed at urgent care including antibiotics and the steroids. The emergency department, the patient was afebrile but tachycardic up to 110 with oxygen saturation 90-94% on 6 L.  He was started on Rocephin and azithromycin.  CTA chest was negative for PE but showed moderate to severe atelectasis/infiltrate of the left lower lobe and left upper lobe with small to moderate pleural effusion.  Pulmonary was consulted to assist.  Multiple bedside ultrasounds did not show significant pleural fluid to tap.  He was treated with 5 days of IV steroids, bronchodilators, and aggressive pulmonary toilet with abx.  He slowly improved.  Repeat CT of chest showed slight increase of his pleural effusion.  Pulmonary recommended transfer to [facility] for IR pigtail catheter insertion, possible VATS.   Assessment/Plan: Sepsis -Present on admission -Presented with leukocytosis, tachycardia, tachypnea  into the 30s -Secondary to pneumonia -Lactic acid peaked 1.6 -PCT 4.98>>3.14>>1.60 -Continue IV antibiotics   Acute respiratory failure with hypoxia -Presented with saturation 86% on room air and tachypnea to the 30s -Secondary to pneumonia and left pleural effusion -Currently down to 6L HFNC>.3L (initially on 15 L HFNC) -Wean oxygen as tolerated for saturation greater 90% -Appreciate PCCM assistance -since he has been having periods of nocturnal desaturations, he will need sleep study once he is over acute illness   Left pleural effusion/pleuritic CP -Request thoracocentesis--not enough fluid to perform -multiple bedside US performed by Dr. Fanny Dance significant fluid -continue steroids>>stopped 11/21 -continue IV dilaudid>>1 mg IV q 2 prn -11/18--personally reviewed CXR--progressive opacification of Left hemithorax -X-ray from 11/21 shows improving aeration on the left with persistent pleural effusion -11/23 CT chest--moderate sized pleural effusion with adjacent consolidation, slightly worse vs 11/16 -discussed with Dr. Melvyn Novas and it was felt that patient [place] benefit from transferring to [facility] to be seen by Pulmonology/IR for placement of pigtail catheter with lytics for loculated effusion   Lobar pneumonia -Continue ceftriaxone -completed 5 days of azithromycin -Continue Xopenex and Atrovent -continue pulmicort and arformoterol -Urinary antigens for pneumococcus and Legionella are negative -Sputum culture shows normal resp flora   Hypertension -Amlodipine discontinued and favor of low-dose clonidine, since amlodipine can interfere with VQ matching and areas of lung disease -BP currently stable   Tobacco abuse -Tobacco cessation discussed -He has a 30-pack-year history   Chronic Right knee pain -PDMP reviewed -he receives monthly percocet 7.5 and pregabalin   Hypokalemia -replete -check mag--2.0  Abdominal pain -CT abd/pelvis--no acute intraabdominal findings    Elevated LFTs -no abdominal pain or vomiting -checking RUQ ultrasound--cholelithiasis without cholecystitis -repeat labs in AM           Status is: Inpatient   Remains inpatient appropriate because: severity of illness requiring high flow oxygen, IV antibiotics               Family Communication:   Family updated at bedside 11/23   Consultants: PCCM   Code Status:  FULL    DVT Prophylaxis:  [place] Lovenox     Procedures: As Listed in Progress Note Above   Antibiotics: Ceftriaxone 11/16>> Azithro 11/16>>11/21       Subjective: Overall sob and pleuritic CP are slowly improving.  Continues to have dyspnea on exertion.  Denies f/c n/v/d, abd pain  Objective: Vitals:   07/18/21 1100 07/18/21 1137 07/18/21 1200 07/18/21 1220  BP: 121/70  116/65   Pulse: 70 80 66 61  Resp: 12 14 15 13   Temp:  98.3 F (36.8 C)    TempSrc:  Oral    SpO2: 96% 96% 96% 97%  Weight:      Height:        Intake/Output Summary (Last 24 hours) at 07/18/2021 1408 Last data filed at 07/18/2021 0522 Gross per 24 hour  Intake 100 ml  Output 1375 ml  Net -1275 ml   Weight change:  Exam:  General:  Pt is alert, follows commands appropriately, not in acute distress HEENT: No icterus, No thrush, No neck mass, [place]/AT Cardiovascular: RRR, S1/S2, no rubs, no gallops Respiratory: diminished BS on left.  Bibasilar crackles. No wheeze Abdomen: Soft/+BS, non tender, non distended, no guarding Extremities: No edema, No lymphangitis, No petechiae, No rashes, no synovitis   Data Reviewed: I have personally reviewed following labs and imaging studies Basic Metabolic Panel: Recent Labs  Lab 07/12/21 0331 07/13/21 0447 07/14/21 0449 07/17/21 0428 07/18/21 0437  NA 134* 135 136 139 140  K 3.4* 3.8 3.7 3.9 3.9  CL 100 101 101 102 101  CO2 25 25 26 30 31   GLUCOSE 158* 161* 149* 160* 100*  BUN 12 11 15 16 18   CREATININE 0.90 0.70 0.78 0.75 0.86  CALCIUM 8.1* 8.4* 8.5* 8.2* 8.1*  MG 1.7  2.0  --   --   --   PHOS 3.9  --   --   --   --    Liver Function Tests: Recent Labs  Lab 07/11/21 2130 07/12/21 0331 07/17/21 0428 07/18/21 0437  AST 22 26 100* 81*  ALT 23 28 267* 253*  ALKPHOS 101 98 173* 180*  BILITOT 0.6 0.7 0.2* 0.3  PROT 8.2* 7.0 6.2* 6.3*  ALBUMIN 3.8 3.2* 2.2* 2.2*   No results for input(s): LIPASE, AMYLASE in the last 168 hours. No results for input(s): AMMONIA in the last 168 hours. Coagulation Profile: Recent Labs  Lab 07/11/21 2130  INR 1.0   CBC: Recent Labs  Lab 07/11/21 2130 07/12/21 0443 07/13/21 0447 07/14/21 0449 07/17/21 0428 07/18/21 0437  WBC 15.8* 19.3* 21.9* 25.0* 22.4* 14.8*  NEUTROABS 13.5*  --   --   --   --   --   HGB 14.6 13.8 13.5 13.0 12.2* 13.1  HCT 43.3 42.2 40.6 40.0 37.1* 40.1  MCV 88.9 91.5 90.6 91.1 90.5 91.1  PLT 388 383 372 392 391 375   Cardiac Enzymes: No results for input(s): CKTOTAL, CKMB, CKMBINDEX, TROPONINI in the last 168 hours. BNP: Invalid  input(s): POCBNP CBG: No results for input(s): GLUCAP in the last 168 hours. HbA1C: No results for input(s): HGBA1C in the last 72 hours. Urine analysis:    Component Value Date/Time   COLORURINE YELLOW 07/11/2021 2245   APPEARANCEUR CLEAR 07/11/2021 2245   LABSPEC 1.021 07/11/2021 2245   PHURINE 5.0 07/11/2021 2245   GLUCOSEU NEGATIVE 07/11/2021 2245   HGBUR MODERATE (A) 07/11/2021 2245   BILIRUBINUR NEGATIVE 07/11/2021 2245   KETONESUR NEGATIVE 07/11/2021 2245   PROTEINUR 30 (A) 07/11/2021 2245   NITRITE NEGATIVE 07/11/2021 2245   LEUKOCYTESUR NEGATIVE 07/11/2021 2245   Sepsis Labs: @LABRCNTIP (procalcitonin:4,lacticidven:4) ) Recent Results (from the past 240 hour(s))  Resp Panel by RT-PCR (Flu A&B, Covid) Nasopharyngeal Swab     Status: None   Collection Time: 07/11/21  9:05 PM   Specimen: Nasopharyngeal Swab; Nasopharyngeal(NP) swabs in vial transport medium  Result Value Ref Range Status   SARS Coronavirus 2 by RT PCR NEGATIVE NEGATIVE  Final    Comment: (NOTE) SARS-CoV-2 target nucleic acids are NOT DETECTED.  The SARS-CoV-2 RNA is generally detectable in upper respiratory specimens during the acute phase of infection. The lowest concentration of SARS-CoV-2 viral copies this assay can detect is 138 copies/mL. A negative result does not preclude SARS-Cov-2 infection and should not be used as the sole basis for treatment or other patient management decisions. A negative result [place] occur with  improper specimen collection/handling, submission of specimen other than nasopharyngeal swab, presence of viral mutation(s) within the areas targeted by this assay, and inadequate number of viral copies(<138 copies/mL). A negative result must be combined with clinical observations, patient history, and epidemiological information. The expected result is Negative.  Fact Sheet for Patients:  EntrepreneurPulse.com.au  Fact Sheet for Healthcare Providers:  IncredibleEmployment.be  This test is no t yet approved or cleared by the [place] FDA and  has been authorized for detection and/or diagnosis of SARS-CoV-2 by FDA under an Emergency Use Authorization (EUA). This EUA will remain  in effect (meaning this test can be used) for the duration of the COVID-19 declaration under Section 564(b)(1) of the Act, 21 U.S.C.section 360bbb-3(b)(1), unless the authorization is terminated  or revoked sooner.       Influenza A by PCR NEGATIVE NEGATIVE Final   Influenza B by PCR NEGATIVE NEGATIVE Final    Comment: (NOTE) The Xpert Xpress SARS-CoV-2/FLU/RSV plus assay is intended as an aid in the diagnosis of influenza from Nasopharyngeal swab specimens and should not be used as a sole basis for treatment. Nasal washings and aspirates are unacceptable for Xpert Xpress SARS-CoV-2/FLU/RSV testing.  Fact Sheet for Patients: EntrepreneurPulse.com.au  Fact Sheet for Healthcare  Providers: IncredibleEmployment.be  This test is not yet approved or cleared by the [place] FDA and has been authorized for detection and/or diagnosis of SARS-CoV-2 by FDA under an Emergency Use Authorization (EUA). This EUA will remain in effect (meaning this test can be used) for the duration of the COVID-19 declaration under Section 564(b)(1) of the Act, 21 U.S.C. section 360bbb-3(b)(1), unless the authorization is terminated or revoked.  Performed at [facility], [address] [place], [place] [address]   Culture, blood (Routine x 2)     Status: None   Collection Time: 07/11/21  9:30 PM   Specimen: BLOOD  Result Value Ref Range Status   Specimen Description BLOOD LEFT ANTECUBITAL  Final   Special Requests   Final    BOTTLES DRAWN AEROBIC AND ANAEROBIC Blood Culture adequate volume   Culture  Final    NO GROWTH 5 DAYS Performed at [facility], [address] [place], [place] [address]    Report Status 07/16/2021 FINAL  Final  Culture, blood (Routine x 2)     Status: None   Collection Time: 07/11/21  9:30 PM   Specimen: BLOOD  Result Value Ref Range Status   Specimen Description BLOOD BLOOD LEFT HAND  Final   Special Requests   Final    BOTTLES DRAWN AEROBIC AND ANAEROBIC Blood Culture adequate volume   Culture   Final    NO GROWTH 5 DAYS Performed at [facility], [address] [place], [place] [address]    Report Status 07/16/2021 FINAL  Final  MRSA Next Gen by PCR, Nasal     Status: None   Collection Time: 07/12/21  5:00 AM  Result Value Ref Range Status   MRSA by PCR Next Gen NOT DETECTED NOT DETECTED Final    Comment: (NOTE) The GeneXpert MRSA Assay (FDA approved for NASAL specimens only), is one component of a comprehensive MRSA colonization surveillance program. It is not intended to diagnose MRSA infection nor to guide or monitor treatment for MRSA infections. Test performance is not FDA approved in patients less than 56  years old. Performed at [facility], [address] [place], [place] [address]   Expectorated Sputum Assessment w Gram Stain, Rflx to Resp Cult     Status: None   Collection Time: 07/14/21  4:15 PM   Specimen: Expectorated Sputum  Result Value Ref Range Status   Specimen Description EXPECTORATED SPUTUM  Final   Special Requests NONE  Final   Sputum evaluation   Final    THIS SPECIMEN IS ACCEPTABLE FOR SPUTUM CULTURE Performed at [facility], [address], [place], [place] [address]    Report Status 07/16/2021 FINAL  Final  Culture, Respiratory w Gram Stain     Status: None   Collection Time: 07/14/21  4:15 PM  Result Value Ref Range Status   Specimen Description   Final    EXPECTORATED SPUTUM Performed at [facility], [address] [place], [place] [address]    Special Requests   Final    NONE Reflexed from [id number] Performed at [facility], [address], [place],  [address]    Gram Stain   Final    NO SQUAMOUS EPITHELIAL CELLS SEEN FEW WBC SEEN FEW GRAM POSITIVE RODS MODERATE GRAM POSITIVE COCCI    Culture   Final    ABUNDANT Normal respiratory flora-no Staph aureus or Pseudomonas seen Performed at [facility], [place] [address], [place],  [address]    Report Status 07/17/2021 FINAL  Final     Scheduled Meds:  arformoterol  15 mcg Nebulization BID   budesonide (PULMICORT) nebulizer solution  0.5 mg Nebulization BID   calcium carbonate  1 tablet Oral Q breakfast   Chlorhexidine Gluconate Cloth  6 each Topical Daily   cloNIDine  0.1 mg Oral BID   dextromethorphan-guaiFENesin  1 tablet Oral BID   docusate sodium  100 mg Oral BID   enoxaparin (LOVENOX) injection  60 mg Subcutaneous Daily   ipratropium  0.5 mg Nebulization BID   levalbuterol  0.63 mg Nebulization BID   lidocaine  1 patch Transdermal Q24H   nicotine  21 mg Transdermal Daily   Continuous Infusions:  cefTRIAXone (ROCEPHIN)  IV Stopped (07/17/21 2157)    Procedures/Studies: CT  ABDOMEN PELVIS WO CONTRAST  Result Date: 07/12/2021 CLINICAL DATA:  Abdominal pain  and sepsis. Left-sided abdominal pain since last evening. History of prior appendectomy. EXAM: CT ABDOMEN AND PELVIS WITHOUT CONTRAST TECHNIQUE: Multidetector CT imaging of the abdomen and pelvis was performed following the standard protocol without IV contrast. COMPARISON:  Chest CT from yesterday. FINDINGS: Lower chest: Progressive dense left lower lobe airspace opacification most likely lobar pneumonia with an associated moderate-sized parapneumonic effusion, increased since yesterday's study. Progressive right basilar atelectasis. Hepatobiliary: No hepatic lesions or intrahepatic biliary dilatation. High attenuation material in the gallbladder likely vicarious excretion from the chest CT from yesterday. There appear to be some filling defects also suggesting gallstones. No CT findings to suggest acute cholecystitis. No common bile duct dilatation. Pancreas: No mass, inflammation or ductal dilatation. Spleen: Normal size.  No focal lesions. Adrenals/Urinary Tract: Adrenal glands and kidneys are grossly normal. No renal or obstructing ureteral calculi. No bladder calculi. No mass lesions. The bladder contains high attenuation material likely excreted contrast material from yesterday CT scan. Stomach/Bowel: The stomach, duodenum, small bowel and colon are unremarkable. No acute inflammatory changes, mass lesions or obstructive findings. Vascular/Lymphatic: The aorta is normal in caliber. Minimal scattered atheroscerlotic calcifications. No mesenteric of retroperitoneal mass or adenopathy. Small scattered lymph nodes are noted. Reproductive: The prostate gland and seminal vesicles are unremarkable. Other: Small periumbilical abdominal wall hernia containing fat. No free air or free fluid in the abdomen/pelvis. Musculoskeletal: No significant bony findings. IMPRESSION: 1. Progressive dense left lower lobe airspace opacification most  likely lobar pneumonia with an associated moderate-sized parapneumonic effusion, increased since yesterday's study. 2. Progressive right basilar atelectasis. 3. No acute abdominal/pelvic findings, mass lesions or adenopathy. 4. Cholelithiasis. Electronically Signed   By: Marijo Sanes M.D.   On: 07/12/2021 14:48   DG Chest 2 View  Result Date: 07/11/2021 CLINICAL DATA:  Shortness of breath and oxygen desaturation. EXAM: CHEST - 2 VIEW COMPARISON:  PA Lat 02/16/2019. FINDINGS: The cardiac size is normal. Central vessels are normal caliber. There is interval low inspiration and development of a moderate layering left pleural effusion with overlying atelectasis or consolidation of the left lower lung field. The hypoexpanded right lung is clear. No pneumothorax is seen. Thoracic cage is intact. IMPRESSION: New finding of moderate left pleural effusion with overlying atelectasis or consolidation in left lower lung field. Clinical correlation and follow-up recommended. Electronically Signed   By: Telford Nab M.D.   On: 07/11/2021 22:04   CT CHEST W CONTRAST  Result Date: 07/18/2021 CLINICAL DATA:  Left-sided chest pain, shortness of breath, productive cough EXAM: CT CHEST WITH CONTRAST TECHNIQUE: Multidetector CT imaging of the chest was performed during intravenous contrast administration. CONTRAST:  63mL OMNIPAQUE IOHEXOL 300 MG/ML  SOLN COMPARISON:  Chest radiograph most recently dated the same day, CT a chest 07/11/2021 FINDINGS: Cardiovascular: The heart is not enlarged. There is a trace pericardial effusion, new since 07/11/2021. The major vessels of the chest are unremarkable. Mediastinum/Nodes: The thyroid is unremarkable. The esophagus is grossly unremarkable. There is no mediastinal, hilar, or axillary lymphadenopathy. Lungs/Pleura: The trachea and central airways are patent. There is a moderate size left pleural effusion with hypoenhancing left basilar consolidation. Compared to the study from  07/11/2021, these findings are slightly worsened. The right lung is essentially clear with minimal linear opacities in the right base likely reflecting atelectasis. There is no pneumothorax. Upper Abdomen: The imaged portions of the upper abdominal viscera are unremarkable. Musculoskeletal: There is no acute osseous abnormality or aggressive osseous lesion. IMPRESSION: 1. Moderate size left pleural effusion with adjacent consolidation  concerning for pneumonia, slightly worsened since the prior CT chest from 07/11/2021. Recommend continued imaging follow-up to resolution. 2. New trace pericardial effusion. Electronically Signed   By: Valetta Mole M.D.   On: 07/18/2021 11:25   CT Angio Chest PE W and/or Wo Contrast  Result Date: 07/11/2021 CLINICAL DATA:  Shortness of breath x2 days. EXAM: CT ANGIOGRAPHY CHEST WITH CONTRAST TECHNIQUE: Multidetector CT imaging of the chest was performed using the standard protocol during bolus administration of intravenous contrast. Multiplanar CT image reconstructions and MIPs were obtained to evaluate the vascular anatomy. CONTRAST:  150mL OMNIPAQUE IOHEXOL 350 MG/ML SOLN COMPARISON:  None. FINDINGS: Cardiovascular: The subsegmental pulmonary arteries are limited in evaluation secondary to suboptimal opacification with intravenous contrast as well as areas of overlying artifact. No evidence of pulmonary embolism. Normal heart size with mild coronary artery calcification. No pericardial effusion. Mediastinum/Nodes: No enlarged mediastinal, hilar, or axillary lymph nodes. Thyroid gland, trachea, and esophagus demonstrate no significant findings. Lungs/Pleura: Moderate to marked severity atelectasis and/or infiltrate is seen within the left lower lobe and along the periphery of the left upper lobe. Mild posterior right basilar atelectasis and/or infiltrate is also seen. A small to moderate size left-sided pleural effusion is seen. No pneumothorax is identified. Upper Abdomen: No  acute abnormality. Musculoskeletal: No chest wall abnormality. No acute or significant osseous findings. Review of the MIP images confirms the above findings. IMPRESSION: 1. Moderate to marked severity left lower lobe and left upper lobe atelectasis and/or infiltrate. 2. Mild posterior right basilar atelectasis and/or infiltrate. 3. Small to moderate size left-sided pleural effusion. 4. Limited evaluation of the pulmonary arteries, as described above, without definite evidence of pulmonary embolism. Electronically Signed   By: Virgina Norfolk M.D.   On: 07/11/2021 23:14   [place] CHEST (PLEURAL EFFUSION)  Result Date: 07/12/2021 CLINICAL DATA:  Left pleural effusion. EXAM: CHEST ULTRASOUND COMPARISON:  July 11, 2021. FINDINGS: Sonographic evaluation of the left hemithorax demonstrates small and somewhat complex appearing left pleural effusion. Adequate fluid pocket for thoracentesis is not visualized. IMPRESSION: Small and somewhat complex appearing left pleural effusion is noted. Electronically Signed   By: Marijo Conception M.D.   On: 07/12/2021 10:38   DG CHEST PORT 1 VIEW  Result Date: 07/18/2021 CLINICAL DATA:  Community acquired pneumonia EXAM: PORTABLE CHEST 1 VIEW COMPARISON:  07/16/2021 FINDINGS: Heart is normal size. Right lung remains clear. Moderate left pleural effusion with left lower lobe airspace disease, unchanged. No acute bony abnormality. IMPRESSION: Continued moderate left effusion with left lower lobe airspace disease. Electronically Signed   By: Rolm Baptise M.D.   On: 07/18/2021 08:12   DG CHEST PORT 1 VIEW  Result Date: 07/16/2021 CLINICAL DATA:  Pneumonia EXAM: PORTABLE CHEST 1 VIEW COMPARISON:  Chest x-ray 07/14/2021 FINDINGS: Heart size appears normal. Mediastinum is within normal limits. Right lung and pleura remain clear. Interval improving aeration of the left upper lung zone and decreased size of a now moderate left pleural effusion. No pneumothorax. IMPRESSION:  Improvement since previous study. Persistent moderate left pleural effusion with associated atelectasis/infiltrate. Electronically Signed   By: Ofilia Neas M.D.   On: 07/16/2021 08:20   DG Chest Port 1 View  Result Date: 07/14/2021 CLINICAL DATA:  Shortness of breath, ICU EXAM: PORTABLE CHEST 1 VIEW COMPARISON:  Chest radiograph dated 1 day prior FINDINGS: The cardiomediastinal silhouette is suboptimally evaluated. A large left pleural effusion with near complete opacification of the left hemithorax is again seen with a small amount of aerated  left upper lobe, not significantly changed compared to the study from 1 day prior. There is a possible trace right pleural effusion. The right lung otherwise remains clear. There is no right effusion. There is no pneumothorax. The bones are stable. IMPRESSION: 1. Large left pleural effusion with a small amount of aerated left upper lobe is not significantly changed compared to the study from 1 day prior. 2. Unchanged possible trace right pleural effusion. Electronically Signed   By: Valetta Mole M.D.   On: 07/14/2021 10:45   DG Chest Port 1 View  Result Date: 07/13/2021 CLINICAL DATA:  Shortness of breath, left pleural effusion EXAM: PORTABLE CHEST 1 VIEW COMPARISON:  Previous studies including the examination of 07/12/2021 FINDINGS: There is further increase in size of left pleural effusion with almost complete opacification. There is decreased aeration in the left lung with small area of aeration in the left upper lung fields. Increased density in the right lower lung fields Z[place] be due to pleural effusion and possibly underlying atelectasis. There is no pneumothorax. IMPRESSION: There is increase in amount of large left pleural effusion with almost complete atelectasis of left lung with small residual area of aeration in the left upper lung fields. Increased density in the right lower lung fields suggests small effusion and possibly underlying atelectasis  with slight improvement. Electronically Signed   By: Elmer Picker M.D.   On: 07/13/2021 08:11   DG CHEST PORT 1 VIEW  Result Date: 07/12/2021 CLINICAL DATA:  Increased shortness of breath EXAM: PORTABLE CHEST 1 VIEW COMPARISON:  Chest x-ray dated July 11, 2021 FINDINGS: Visualized cardiac and mediastinal contours are unchanged. Small right and large left pleural effusions with associated atelectasis, increased compared to prior exam. New rightward deviation of the trachea. No evidence of pneumothorax. IMPRESSION: 1. Small right and large left pleural effusions with associated atelectasis, increased compared to prior exam. 2. New rightward deviation of the trachea, likely related to large left pleural effusion. Electronically Signed   By: Yetta Glassman M.D.   On: 07/12/2021 14:05   US Abdomen Limited RUQ (LIVER/GB)  Result Date: 07/18/2021 CLINICAL DATA:  Elevated LFTs EXAM: ULTRASOUND ABDOMEN LIMITED RIGHT UPPER QUADRANT COMPARISON:  CT abdomen and pelvis 07/12/2021 FINDINGS: Gallbladder: Mobile echogenic shadowing calculi. No significant wall thickening or pericholecystic fluid. No sonographic Murphy sign. Common bile duct: Diameter: 5 Liver: Mildly increased echogenicity with no focal mass identified. Portal vein is patent on color Doppler imaging with normal direction of blood flow towards the liver. Other: None. IMPRESSION: 1. Cholelithiasis. 2. Mildly increased echogenicity of the liver parenchyma which Archibald represent hepatic steatosis and/or other hepatocellular disease. Electronically Signed   By: Ofilia Neas M.D.   On: 07/18/2021 10:34    Orson Eva, DO  [facility]  If 7PM-7AM, please contact night-coverage [email or web address] Password [email or web address] 07/18/2021, 2:08 PM   LOS: 6 days

## 2021-07-18 NOTE — Progress Notes (Signed)
[facility] at bedside to transport pt to [facility]. Pt's wife is taking all pt belongings with her.

## 2021-07-18 NOTE — Progress Notes (Signed)
NAME:  Victor [name], MRN:  [id number], DOB:  [birth year], LOS: 6 ADMISSION DATE:  07/11/2021, CONSULTATION DATE:  07/12/2021 REFERRING MD:  Dr. Carles Collet, [facility], CHIEF COMPLAINT:  Short of breath   History of Present Illness:  45 yo male smoker developed pain in his left chest on 07/06/21.  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  He then developed cough with rust colored sputum, chills, sweats, and shakes.  Appetite has decreased.  Has pleuritic pain on left side causing splinting with respiration.  In ER he had SpO2 86% on room air.  CT chest showed LLL infiltrate and small effusion.  He was started on antibiotics, supplemental oxygen and solumedrol.  PCCM asked to assist with management in ICU.  Pertinent  Medical History  GERD, HTN, Stomach ulcer  Significant Hospital Events: Including procedures, antibiotic start and stop dates in addition to other pertinent events   11/16 admit 11/17 add solumedrol for pleurisy> d/c'd 11/21   Studies:  CT angio chest 07/11/21 >> LLL infiltrate, small Lt pleural effusion U/S chest 07/12/21 >> small, complex effusion Urinary strep 11/16 neg Urinary legionella 11/16 neg  MRSA PCR screen 11/17 neg  CT with contrast 11/23>>>   Scheduled Meds:  arformoterol  15 mcg Nebulization BID   budesonide (PULMICORT) nebulizer solution  0.5 mg Nebulization BID   calcium carbonate  1 tablet Oral Q breakfast   Chlorhexidine Gluconate Cloth  6 each Topical Daily   cloNIDine  0.1 mg Oral BID   dextromethorphan-guaiFENesin  1 tablet Oral BID   docusate sodium  100 mg Oral BID   enoxaparin (LOVENOX) injection  60 mg Subcutaneous Daily   ipratropium  0.5 mg Nebulization BID   levalbuterol  0.63 mg Nebulization BID   lidocaine  1 patch Transdermal Q24H   nicotine  21 mg Transdermal Daily   Continuous Infusions:  cefTRIAXone (ROCEPHIN)  IV Stopped (07/17/21 2157)   PRN Meds:.acetaminophen **OR** acetaminophen, acetaminophen,  HYDROmorphone (DILAUDID) injection, levalbuterol, methocarbamol, oxyCODONE, sodium chloride   Interim History / Subjective:  Sitting in chair, pain on L with deep breath only/ using IS with vol about 1.5 liters  Objective   Blood pressure 130/77, pulse 76, temperature (!) 97.4 F (36.3 C), temperature source Oral, resp. rate (!) 22, height 5\' 11"  (1.803 m), weight 113.2 kg, SpO2 100 %.        Intake/Output Summary (Last 24 hours) at 07/18/2021 9024 Last data filed at 07/18/2021 0522 Gross per 24 hour  Intake 100 ml  Output 1375 ml  Net -1275 ml   Filed Weights   07/11/21 2058 07/12/21 0515  Weight: 113.9 kg 113.2 kg    Examination: Tmax   97.9 General appearance:    sitting in chair nad   At Rest 02 sats  96% on 6lpm   No jvd Oropharynx clear,  mucosa nl Neck supple Lungs with better air movement, no bronchial changes L base RRR no s3 or or sign murmur Abd soft   Extr warm with no edema or clubbing noted Neuro  Sensorium intact,  no apparent motor deficits    I personally reviewed images and agree with radiology impression as follows:  CXR:   portable  11/23 Scapula projecting over L lung but overall has improved aeration of L lung with less effusion  Resolved Hospital Problem list     Assessment & Plan:   Bacterial community acquired pneumonia with lobar consolidation of LLL. - approp rx for  CAP/parapneumonic effsuion     Lt pleuritic chest pain secondary to loculated L effusion  - reviewed bedside u/s on 07/13/21, and only small effusion - CT with contrast ordered this am to determine whether pigtail vs VATS vs just rx with IS /abx and tincture of time   Discussed in detail all the  indications, usual  risks and alternatives  relative to the benefits with patient who agrees to proceed with w/u as outlined.       Acute hypoxic respiratory failure secondary to CAP/ atx from effusion, improving  - goal SpO2 > 92% / titrate down as tol / continue mobilization/  IS    HBP with prior h/o narcotic use for R knee pain control  >>> rx per [facility]        Labs    CMP Latest Ref Rng & Units 07/18/2021 07/17/2021 07/14/2021  Glucose 70 - 99 mg/dL 100(H) 160(H) 149(H)  BUN 6 - 20 mg/dL 18 16 15   Creatinine 0.61 - 1.24 mg/dL 0.86 0.75 0.78  Sodium 135 - 145 mmol/L 140 139 136  Potassium 3.5 - 5.1 mmol/L 3.9 3.9 3.7  Chloride 98 - 111 mmol/L 101 102 101  CO2 22 - 32 mmol/L 31 30 26   Calcium 8.9 - 10.3 mg/dL 8.1(L) 8.2(L) 8.5(L)  Total Protein 6.5 - 8.1 g/dL 6.3(L) 6.2(L) -  Total Bilirubin 0.3 - 1.2 mg/dL 0.3 0.2(L) -  Alkaline Phos 38 - 126 U/L 180(H) 173(H) -  AST 15 - 41 U/L 81(H) 100(H) -  ALT 0 - 44 U/L 253(H) 267(H) -    CBC Latest Ref Rng & Units 07/18/2021 07/17/2021 07/14/2021  WBC 4.0 - 10.5 K/uL 14.8(H) 22.4(H) 25.0(H)  Hemoglobin 13.0 - 17.0 g/dL 13.1 12.2(L) 13.0  Hematocrit 39.0 - 52.0 % 40.1 37.1(L) 40.0  Platelets 150 - 400 K/uL 375 391 392      Christinia Gully, MD [facility] and [place] [phone]   After 7:00 pm call Elink  [phone]

## 2021-07-19 ENCOUNTER — Inpatient Hospital Stay (HOSPITAL_COMMUNITY): Payer: Medicaid Other

## 2021-07-19 DIAGNOSIS — J90 Pleural effusion, not elsewhere classified: Secondary | ICD-10-CM | POA: Diagnosis not present

## 2021-07-19 DIAGNOSIS — J9601 Acute respiratory failure with hypoxia: Secondary | ICD-10-CM | POA: Diagnosis not present

## 2021-07-19 DIAGNOSIS — J189 Pneumonia, unspecified organism: Secondary | ICD-10-CM | POA: Diagnosis not present

## 2021-07-19 HISTORY — PX: IR PERC PLEURAL DRAIN W/INDWELL CATH W/IMG GUIDE: IMG5383

## 2021-07-19 LAB — BODY FLUID CELL COUNT WITH DIFFERENTIAL
Eos, Fluid: 0 %
Lymphs, Fluid: 14 %
Monocyte-Macrophage-Serous Fluid: 1 % — ABNORMAL LOW (ref 50–90)
Neutrophil Count, Fluid: 85 % — ABNORMAL HIGH (ref 0–25)
Total Nucleated Cell Count, Fluid: 171 cu mm (ref 0–1000)

## 2021-07-19 LAB — CBC
HCT: 41 % (ref 39.0–52.0)
Hemoglobin: 13.4 g/dL (ref 13.0–17.0)
MCH: 29 pg (ref 26.0–34.0)
MCHC: 32.7 g/dL (ref 30.0–36.0)
MCV: 88.7 fL (ref 80.0–100.0)
Platelets: 384 10*3/uL (ref 150–400)
RBC: 4.62 MIL/uL (ref 4.22–5.81)
RDW: 14.6 % (ref 11.5–15.5)
WBC: 15.1 10*3/uL — ABNORMAL HIGH (ref 4.0–10.5)
nRBC: 0 % (ref 0.0–0.2)

## 2021-07-19 LAB — MAGNESIUM: Magnesium: 2.3 mg/dL (ref 1.7–2.4)

## 2021-07-19 LAB — PROCALCITONIN: Procalcitonin: 0.12 ng/mL

## 2021-07-19 LAB — LACTATE DEHYDROGENASE: LDH: 212 U/L — ABNORMAL HIGH (ref 98–192)

## 2021-07-19 LAB — COMPREHENSIVE METABOLIC PANEL
ALT: 179 U/L — ABNORMAL HIGH (ref 0–44)
AST: 38 U/L (ref 15–41)
Albumin: 2.1 g/dL — ABNORMAL LOW (ref 3.5–5.0)
Alkaline Phosphatase: 168 U/L — ABNORMAL HIGH (ref 38–126)
Anion gap: 8 (ref 5–15)
BUN: 12 mg/dL (ref 6–20)
CO2: 29 mmol/L (ref 22–32)
Calcium: 8 mg/dL — ABNORMAL LOW (ref 8.9–10.3)
Chloride: 99 mmol/L (ref 98–111)
Creatinine, Ser: 0.85 mg/dL (ref 0.61–1.24)
GFR, Estimated: >60 mL/min (ref >60)
Glucose, Bld: 119 mg/dL — ABNORMAL HIGH (ref 70–99)
Potassium: 3.9 mmol/L (ref 3.5–5.1)
Sodium: 136 mmol/L (ref 135–145)
Total Bilirubin: 0.4 mg/dL (ref 0.3–1.2)
Total Protein: 6.2 g/dL — ABNORMAL LOW (ref 6.5–8.1)

## 2021-07-19 LAB — LACTATE DEHYDROGENASE, PLEURAL OR PERITONEAL FLUID: LD, Fluid: 2324 U/L — ABNORMAL HIGH (ref 3–23)

## 2021-07-19 LAB — PROTEIN, TOTAL: Total Protein: 6.5 g/dL (ref 6.5–8.1)

## 2021-07-19 LAB — C-REACTIVE PROTEIN: CRP: 7.7 mg/dL — ABNORMAL HIGH (ref <1.0)

## 2021-07-19 LAB — PROTEIN, PLEURAL OR PERITONEAL FLUID: Total protein, fluid: 4.2 g/dL

## 2021-07-19 LAB — BRAIN NATRIURETIC PEPTIDE: B Natriuretic Peptide: 6 pg/mL (ref 0.0–100.0)

## 2021-07-19 MED ORDER — SODIUM CHLORIDE 0.9 % IV SOLN
3.0000 g | Freq: Three times a day (TID) | INTRAVENOUS | Status: DC
  Administered 2021-07-19 – 2021-07-21 (×6): 3 g via INTRAVENOUS
  Filled 2021-07-19 (×6): qty 8

## 2021-07-19 MED ORDER — MIDAZOLAM HCL 2 MG/2ML IJ SOLN
INTRAMUSCULAR | Status: AC | PRN
  Administered 2021-07-19 (×2): .5 mg via INTRAVENOUS
  Administered 2021-07-19: 1 mg via INTRAVENOUS

## 2021-07-19 MED ORDER — SODIUM CHLORIDE (PF) 0.9 % IJ SOLN
10.0000 mg | Freq: Once | INTRAMUSCULAR | Status: AC
  Administered 2021-07-19: 10 mg via INTRAPLEURAL
  Filled 2021-07-19: qty 10

## 2021-07-19 MED ORDER — LIDOCAINE HCL (PF) 1 % IJ SOLN
30.0000 mL | Freq: Once | INTRAMUSCULAR | Status: AC
  Filled 2021-07-19: qty 30

## 2021-07-19 MED ORDER — SODIUM CHLORIDE 0.9% FLUSH
10.0000 mL | Freq: Three times a day (TID) | INTRAVENOUS | Status: DC
  Administered 2021-07-19 – 2021-07-23 (×13): 10 mL

## 2021-07-19 MED ORDER — FENTANYL CITRATE (PF) 100 MCG/2ML IJ SOLN
INTRAMUSCULAR | Status: AC
  Filled 2021-07-19: qty 2

## 2021-07-19 MED ORDER — LEVOFLOXACIN 500 MG PO TABS
500.0000 mg | ORAL_TABLET | Freq: Every day | ORAL | Status: DC
  Administered 2021-07-19 – 2021-07-21 (×3): 500 mg via ORAL
  Filled 2021-07-19 (×3): qty 1

## 2021-07-19 MED ORDER — LIDOCAINE HCL (PF) 1 % IJ SOLN
INTRAMUSCULAR | Status: AC
  Administered 2021-07-19: 30 mL via INTRADERMAL
  Filled 2021-07-19: qty 30

## 2021-07-19 MED ORDER — STERILE WATER FOR INJECTION IJ SOLN
5.0000 mg | Freq: Once | RESPIRATORY_TRACT | Status: AC
  Administered 2021-07-19: 5 mg via INTRAPLEURAL
  Filled 2021-07-19: qty 5

## 2021-07-19 MED ORDER — MIDAZOLAM HCL 2 MG/2ML IJ SOLN
INTRAMUSCULAR | Status: AC
  Filled 2021-07-19: qty 2

## 2021-07-19 MED ORDER — CARVEDILOL 6.25 MG PO TABS
6.2500 mg | ORAL_TABLET | Freq: Two times a day (BID) | ORAL | Status: DC
  Administered 2021-07-19 – 2021-07-23 (×8): 6.25 mg via ORAL
  Filled 2021-07-19 (×8): qty 1

## 2021-07-19 MED ORDER — FENTANYL CITRATE (PF) 100 MCG/2ML IJ SOLN
INTRAMUSCULAR | Status: AC | PRN
  Administered 2021-07-19 (×3): 25 ug via INTRAVENOUS

## 2021-07-19 NOTE — Progress Notes (Signed)
PROGRESS NOTE                                                                                                                                                                                                             Patient Demographics:    Victor [name], is a 45 y.o. 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  Despite appropriate treatment he continued to have left therapeutic chest pain CT scan demonstrated moderate to large left-sided pleural effusion around the infiltrate suspicious for parapneumonic effusion versus empyema he was transferred to [facility] for further care.   Subjective:    Victor [name] today has, No headache, no fever, no abdominal pain, still has left-sided pleuritic chest pain, mild shortness of breath.   Assessment  & Plan :    1. Acute Hypoxic Resp. Failure due to CAP with L. Sided infiltrate and surrounding moderate to large effusion - no history of travel or sick contact exposure, he does take some narcotics at home for chronic knee pain, question if he at some point had some microaspiration which she does not recall, has been treated with empiric IV antibiotics for a week without much improvement.  For now we will switch him to Unasyn and Levaquin covering for  microaspiration and atypical bugs, stop IV Rocephin for now.  Pulmonary seeing the patient I think he will benefit from left-sided fluid drainage and possible catheter placement he likely has parapneumonic effusion versus empyema.  Follow cultures which are so far negative, discussed with Dr. Valeta Harms.  2.  Multiple right knee surgeries with chronic pain.  Supportive care.  On narcotics at home.  3.  Hypertension.  Placed on Coreg will monitor.  4.  Smoking.  Counseled to quit smoking has 30-pack-year history.  5.  Incidental finding of fatty liver with obesity.  BMI of 34.  Follow with PCP for weight loss outpatient GI follow-up to be arranged by PCP.  6.  Trace pericardial effusion noted on CT scan.  Check echocardiogram.  Symptom-free right now.      Condition - fair  Family Communication  :  Family bedside  Code Status :  Full  Consults  :  PCCM  PUD Prophylaxis :    Procedures  :     CT 07/18/21 - 1. Moderate size left pleural effusion with adjacent consolidation concerning for pneumonia, slightly worsened since the prior CT chest from 07/11/2021. Recommend continued imaging follow-up to resolution. 2. New trace pericardial effusion.  Right upper quadrant ultrasound.  Gallstones and fatty liver  Echocardiogram.      Disposition Plan  :    Status is: Inpatient  Remains inpatient appropriate because: Evaluation of left parapneumonic effusion/empyema Kreuser require pigtail catheter placement.  DVT Prophylaxis  :  Lovenox  Place TED hose Start: 07/17/21 1214 SCDs Start: 07/12/21 0443    Lab Results  Component Value Date   PLT 384 07/19/2021    Diet :  Diet Order             Diet Heart Room service appropriate? Yes; Fluid consistency: Thin  Diet effective now                    Inpatient Medications  Scheduled Meds:  arformoterol  15 mcg Nebulization BID   budesonide (PULMICORT) nebulizer solution  0.5 mg Nebulization BID   calcium carbonate  1 tablet  Oral Q breakfast   Chlorhexidine Gluconate Cloth  6 each Topical Daily   cloNIDine  0.1 mg Oral BID   dextromethorphan-guaiFENesin  1 tablet Oral BID   docusate sodium  100 mg Oral BID   enoxaparin (LOVENOX) injection  60 mg Subcutaneous Daily   ipratropium  0.5 mg Nebulization BID   levalbuterol  0.63 mg Nebulization BID   lidocaine  1 patch Transdermal Q24H   nicotine  21 mg Transdermal Daily   Continuous Infusions:  cefTRIAXone (ROCEPHIN)  IV Stopped (07/18/21 2205)   PRN Meds:.acetaminophen **OR** acetaminophen, acetaminophen, HYDROmorphone (DILAUDID) injection, levalbuterol, methocarbamol, oxyCODONE, sodium chloride  Time Spent in minutes  30   Lala Lund M.D on 07/19/2021 at 9:24 AM  To page go to [email or web address]   [facility] -  Office  [phone]  See all Orders from today for further details    Objective:   Vitals:   07/19/21 0000 07/19/21 0812 07/19/21 0813 07/19/21 0900  BP: 114/78   133/82  Pulse: 74   86  Resp: 14   15  Temp: 98.6 F (37 C)     TempSrc: Oral     SpO2: 93% 94% 94% 94%  Weight:      Height:        Wt Readings from Last 3 Encounters:  07/12/21 113.2 kg  07/02/21 113.9 kg  03/29/21 113 kg     Intake/Output Summary (Last 24 hours) at 07/19/2021 0924 Last data filed at 07/18/2021 2104 Gross per 24 hour  Intake 100 ml  Output --  Net 100 ml     Physical Exam  Awake Alert, No new F.N deficits, Normal affect Mildred.AT,PERRAL Supple Neck, No JVD,   Symmetrical Chest wall movement, reduced left basilar breath sounds  RRR,No Gallops,Rubs or new Murmurs,  +ve B.Sounds, Abd Soft, No tenderness,   No Cyanosis, Clubbing or edema       Data Review:    CBC Recent Labs  Lab 07/13/21 0447 07/14/21 0449 07/17/21 0428 07/18/21 0437 07/19/21 0127  WBC 21.9* 25.0* 22.4* 14.8* 15.1*  HGB 13.5 13.0 12.2* 13.1 13.4  HCT 40.6 40.0 37.1* 40.1 41.0  PLT 372 392 391 375 384  MCV 90.6 91.1 90.5 91.1 88.7  MCH 30.1 29.6 29.8  29.8 29.0  MCHC 33.3 32.5 32.9 32.7 32.7  RDW 13.6 13.8 14.4 14.5 14.6    Electrolytes Recent Labs  Lab 07/13/21 0447 07/14/21 0449 07/17/21 0428 07/18/21 0437 07/19/21 0127 07/19/21 0746  NA 135 136 139 140 136  --   K 3.8 3.7 3.9 3.9 3.9  --   CL 101 101 102 101 99  --   CO2 25 26 30 31 29   --   GLUCOSE 161* 149* 160* 100* 119*  --   BUN 11 15 16 18 12   --   CREATININE 0.70 0.78 0.75 0.86 0.85  --   CALCIUM 8.4* 8.5* 8.2* 8.1* 8.0*  --   AST  --   --  100* 81* 38  --   ALT  --   --  267* 253* 179*  --   ALKPHOS  --   --  173* 180* 168*  --   BILITOT  --   --  0.2* 0.3 0.4  --   ALBUMIN  --   --  2.2* 2.2* 2.1*  --   MG 2.0  --   --   --  2.3  --   CRP  --   --   --   --   --  7.7*  PROCALCITON 3.14 1.60  --   --   --  0.12  BNP  --   --  107.0*  --   --  6.0    ------------------------------------------------------------------------------------------------------------------ No results for input(s): CHOL, HDL, LDLCALC, TRIG, CHOLHDL, LDLDIRECT in the last 72 hours.  Lab Results  Component Value Date   HGBA1C 5.4 03/03/2018    No results for input(s): TSH, T4TOTAL, T3FREE, THYROIDAB in the last 72 hours.  Invalid input(s): FREET3 ------------------------------------------------------------------------------------------------------------------ ID Labs Recent Labs  Lab 07/13/21 0447 07/14/21 0449 07/17/21 0428 07/18/21 0437 07/19/21 0127 07/19/21 0746  WBC 21.9* 25.0* 22.4* 14.8* 15.1*  --   PLT 372 392 391 375 384  --   CRP  --   --   --   --   --  7.7*  PROCALCITON 3.14 1.60  --   --   --  0.12  CREATININE 0.70 0.78 0.75 0.86 0.85  --    Cardiac Enzymes No results for input(s): CKMB, TROPONINI, MYOGLOBIN in the last 168 hours.  Invalid input(s): CK    Radiology Reports CT CHEST W CONTRAST  Result Date: 07/18/2021 CLINICAL DATA:  Left-sided chest pain, shortness of breath, productive cough EXAM: CT CHEST WITH CONTRAST TECHNIQUE: Multidetector CT  imaging of the chest was performed during intravenous contrast administration. CONTRAST:  26mL OMNIPAQUE IOHEXOL 300 MG/ML  SOLN COMPARISON:  Chest radiograph most recently dated the same day, CT a chest 07/11/2021 FINDINGS: Cardiovascular: The heart is not enlarged. There is a trace pericardial effusion, new since 07/11/2021. The major vessels of the chest are unremarkable. Mediastinum/Nodes: The thyroid is unremarkable. The esophagus is grossly unremarkable. There is no mediastinal, hilar, or axillary lymphadenopathy. Lungs/Pleura: The trachea and  central airways are patent. There is a moderate size left pleural effusion with hypoenhancing left basilar consolidation. Compared to the study from 07/11/2021, these findings are slightly worsened. The right lung is essentially clear with minimal linear opacities in the right base likely reflecting atelectasis. There is no pneumothorax. Upper Abdomen: The imaged portions of the upper abdominal viscera are unremarkable. Musculoskeletal: There is no acute osseous abnormality or aggressive osseous lesion. IMPRESSION: 1. Moderate size left pleural effusion with adjacent consolidation concerning for pneumonia, slightly worsened since the prior CT chest from 07/11/2021. Recommend continued imaging follow-up to resolution. 2. New trace pericardial effusion. Electronically Signed   By: Valetta Mole M.D.   On: 07/18/2021 11:25   DG CHEST PORT 1 VIEW  Result Date: 07/18/2021 CLINICAL DATA:  Community acquired pneumonia EXAM: PORTABLE CHEST 1 VIEW COMPARISON:  07/16/2021 FINDINGS: Heart is normal size. Right lung remains clear. Moderate left pleural effusion with left lower lobe airspace disease, unchanged. No acute bony abnormality. IMPRESSION: Continued moderate left effusion with left lower lobe airspace disease. Electronically Signed   By: Rolm Baptise M.D.   On: 07/18/2021 08:12   DG CHEST PORT 1 VIEW  Result Date: 07/16/2021 CLINICAL DATA:  Pneumonia EXAM: PORTABLE  CHEST 1 VIEW COMPARISON:  Chest x-ray 07/14/2021 FINDINGS: Heart size appears normal. Mediastinum is within normal limits. Right lung and pleura remain clear. Interval improving aeration of the left upper lung zone and decreased size of a now moderate left pleural effusion. No pneumothorax. IMPRESSION: Improvement since previous study. Persistent moderate left pleural effusion with associated atelectasis/infiltrate. Electronically Signed   By: Ofilia Neas M.D.   On: 07/16/2021 08:20   US Abdomen Limited RUQ (LIVER/GB)  Result Date: 07/18/2021 CLINICAL DATA:  Elevated LFTs EXAM: ULTRASOUND ABDOMEN LIMITED RIGHT UPPER QUADRANT COMPARISON:  CT abdomen and pelvis 07/12/2021 FINDINGS: Gallbladder: Mobile echogenic shadowing calculi. No significant wall thickening or pericholecystic fluid. No sonographic Murphy sign. Common bile duct: Diameter: 5 Liver: Mildly increased echogenicity with no focal mass identified. Portal vein is patent on color Doppler imaging with normal direction of blood flow towards the liver. Other: None. IMPRESSION: 1. Cholelithiasis. 2. Mildly increased echogenicity of the liver parenchyma which [place] represent hepatic steatosis and/or other hepatocellular disease. Electronically Signed   By: Ofilia Neas M.D.   On: 07/18/2021 10:34

## 2021-07-19 NOTE — Procedures (Signed)
Interventional Radiology Procedure Note  Procedure: US guided 12 fr right chest tube placement  Indication: Loculated pleural effusion  Findings: Please refer to procedural dictation for full description.  Complications: None  EBL: < 10 mL  Miachel Roux, MD [phone]

## 2021-07-19 NOTE — Progress Notes (Signed)
Chief Complaint: Patient was seen in consultation today for left sided chest drain  Referring Physician(s): Dr. Carles Collet  Supervising Physician: Mir, Sharen Heck  Patient Status: [facility] - In-pt  History of Present Illness: Victor [name] is a 45 y.o. male with c/o left sided chest discomfort for a few weeks. He is found to have loculated left effusion and adjacent consolidation concerning for pneumonia. Pulmonology team feels given loculated nature of effusion, he would need pigtail chest tube for drainage and possible lytics. IR is asked to place chest tube. PMHx, meds, labs, imaging, allergies reviewed. Has been NPO today as directed. Family at bedside.   Past Medical History:  Diagnosis Date   GERD (gastroesophageal reflux disease)    History of stomach ulcers    Hypertension     Past Surgical History:  Procedure Laterality Date   ANTERIOR CRUCIATE LIGAMENT REPAIR Right 05/19/2018   Procedure: RIGHT KNEE ARTHROSCOPY WITH ANTERIOR CRUCIATE LIGAMENT (ACL) REPAIR and MEDIAL MENISECTOMY;  Surgeon: [name], MD;  Location: [facility];  Service: Orthopedics;  Laterality: Right;   APPENDECTOMY     HARDWARE REMOVAL Right 09/21/2019   Procedure: HARDWARE REMOVAL (RICHARD'S STAPLE) RIGHT KNEE;  Surgeon: [name], MD;  Location: [facility];  Service: Orthopedics;  Laterality: Right;   KNEE ARTHROSCOPY WITH MEDIAL MENISECTOMY Right 05/29/2017   Procedure: KNEE ARTHROSCOPY WITH MEDIAL MENISECTOMY and lateral meniscectomy;  Surgeon: [name], MD;  Location: [facility];  Service: Orthopedics;  Laterality: Right;   KNEE ARTHROSCOPY WITH MEDIAL MENISECTOMY Right 02/05/2018   Procedure: RIGHT KNEE ARTHROSCOPY WITH PARTIAL MEDIAL AND LATERAL MENISECTOMY;  Surgeon: [name], MD;  Location: [facility];  Service: Orthopedics;  Laterality: Right;   TOTAL KNEE ARTHROPLASTY Right 03/28/2020   Procedure: TOTAL KNEE ARTHROPLASTY;  Surgeon: [name], MD;  Location: [facility];   Service: Orthopedics;  Laterality: Right;   TOTAL KNEE REVISION Right 08/08/2020   Procedure: REVISION OF RIGHT TOTAL KNEE  TIBIAL COMPONENTS ONLY;  Surgeon: [name], MD;  Location: [facility];  Service: Orthopedics;  Laterality: Right;    Allergies: Patient has no known allergies.  Medications:  Current Facility-Administered Medications:    acetaminophen (TYLENOL) tablet 650 mg, 650 mg, Oral, Q6H PRN, 650 mg at 07/14/21 2015 **OR** acetaminophen (TYLENOL) suppository 650 mg, 650 mg, Rectal, Q6H PRN, Tat, David, MD   acetaminophen (TYLENOL) tablet 650 mg, 650 mg, Oral, Q6H PRN, Tat, David, MD, 650 mg at 07/15/21 2595   arformoterol (BROVANA) nebulizer solution 15 mcg, 15 mcg, Nebulization, BID, Tat, David, MD, 15 mcg at 07/19/21 0809   budesonide (PULMICORT) nebulizer solution 0.5 mg, 0.5 mg, Nebulization, BID, Tat, David, MD, 0.5 mg at 07/19/21 0809   calcium carbonate (OS-CAL - dosed in mg of elemental calcium) tablet 500 mg of elemental calcium, 1 tablet, Oral, Q breakfast, Tat, David, MD, 500 mg of elemental calcium at 07/19/21 0846   cefTRIAXone (ROCEPHIN) 2 g in sodium chloride 0.9 % 100 mL IVPB, 2 g, Intravenous, Q24H, Tat, Shanon Brow, MD, Stopped at 07/18/21 2205   Chlorhexidine Gluconate Cloth 2 % PADS 6 each, 6 each, Topical, Daily, Tat, David, MD, 6 each at 07/18/21 6387   cloNIDine (CATAPRES) tablet 0.1 mg, 0.1 mg, Oral, BID, Tat, David, MD, 0.1 mg at 07/19/21 0846   dextromethorphan-guaiFENesin (MUCINEX DM) 30-600 MG per 12 hr tablet 1 tablet, 1 tablet, Oral, BID, Tat, David, MD, 1 tablet at 07/19/21 0846   docusate sodium (COLACE) capsule 100 mg, 100 mg, Oral,  BID, Tat, Shanon Brow, MD, 100 mg at 07/19/21 0846   enoxaparin (LOVENOX) injection 60 mg, 60 mg, Subcutaneous, Daily, Tat, David, MD, 60 mg at 07/18/21 4287   HYDROmorphone (DILAUDID) injection 1 mg, 1 mg, Intravenous, Q2H PRN, Tat, David, MD, 1 mg at 07/19/21 0846   ipratropium (ATROVENT) nebulizer solution 0.5 mg, 0.5 mg,  Nebulization, BID, Tat, David, MD, 0.5 mg at 07/19/21 0809   levalbuterol (XOPENEX) nebulizer solution 0.63 mg, 0.63 mg, Nebulization, Q4H PRN, Tat, David, MD   levalbuterol Penne Lash) nebulizer solution 0.63 mg, 0.63 mg, Nebulization, BID, Tat, David, MD, 0.63 mg at 07/18/21 2042   lidocaine (LIDODERM) 5 % 1 patch, 1 patch, Transdermal, Q24H, Tat, David, MD, 1 patch at 07/18/21 0319   methocarbamol (ROBAXIN) tablet 500 mg, 500 mg, Oral, Q6H PRN, Tat, David, MD, 500 mg at 07/18/21 1628   nicotine (NICODERM CQ - dosed in mg/24 hours) patch 21 mg, 21 mg, Transdermal, Daily, Tat, David, MD   oxyCODONE (Oxy IR/ROXICODONE) immediate release tablet 5-10 mg, 5-10 mg, Oral, Q4H PRN, Tat, David, MD, 10 mg at 07/19/21 0409   sodium chloride (OCEAN) 0.65 % nasal spray 1 spray, 1 spray, Each Nare, PRN, Orson Eva, MD, 1 spray at 07/17/21 1245    Family History  Problem Relation Age of Onset   Diabetes Mother    Heart failure Mother    Cancer Mother    Pancreatic cancer Mother    Cancer Father    Throat cancer Father    Healthy Sister    Head & neck cancer Brother    Bone cancer Maternal Grandmother    Diabetes Maternal Grandmother    CAD Maternal Grandfather    Brain cancer Maternal Grandfather    Colon cancer Paternal Grandfather     Social History   Socioeconomic History   Marital status: Married    Spouse name: Not on file   Number of children: 3   Years of education: Not on file   Highest education level: Not on file  Occupational History   Not on file  Tobacco Use   Smoking status: Every Day    Packs/day: 0.50    Years: 15.00    Pack years: 7.50    Types: Cigarettes    Start date: 07/07/1991    Last attempt to quit: 04/09/2018    Years since quitting: 3.2   Smokeless tobacco: Former    Types: Chew    Quit date: 06/09/2018  Vaping Use   Vaping Use: Never used  Substance and Sexual Activity   Alcohol use: No   Drug use: No   Sexual activity: Yes  Other Topics Concern    Not on file  Social History Narrative   Not on file   Social Determinants of Health   Financial Resource Strain: Not on file  Food Insecurity: Not on file  Transportation Needs: Not on file  Physical Activity: Not on file  Stress: Not on file  Social Connections: Not on file    Review of Systems: A 12 point ROS discussed and pertinent positives are indicated in the HPI above.  All other systems are negative.  Review of Systems  Vital Signs: BP 133/82 (BP Location: Left Arm)   Pulse 86   Temp 98.6 F (37 C) (Oral)   Resp 15   Ht 5\' 11"  (1.803 m)   Wt 113.2 kg   SpO2 94%   BMI 34.81 kg/m   Physical Exam Constitutional:      Appearance: Normal  appearance. He is well-developed. He is not ill-appearing.  HENT:     Mouth/Throat:     Mouth: Mucous membranes are moist.     Pharynx: Oropharynx is clear.  Cardiovascular:     Rate and Rhythm: Normal rate and regular rhythm.     Heart sounds: Normal heart sounds.  Pulmonary:     Effort: Pulmonary effort is normal. No respiratory distress.     Comments: Diminished left BS Neurological:     General: No focal deficit present.     Mental Status: He is alert and oriented to person, place, and time.  Psychiatric:        Mood and Affect: Mood normal.        Thought Content: Thought content normal.      Imaging: CT ABDOMEN PELVIS WO CONTRAST  Result Date: 07/12/2021 CLINICAL DATA:  Abdominal pain and sepsis. Left-sided abdominal pain since last evening. History of prior appendectomy. EXAM: CT ABDOMEN AND PELVIS WITHOUT CONTRAST TECHNIQUE: Multidetector CT imaging of the abdomen and pelvis was performed following the standard protocol without IV contrast. COMPARISON:  Chest CT from yesterday. FINDINGS: Lower chest: Progressive dense left lower lobe airspace opacification most likely lobar pneumonia with an associated moderate-sized parapneumonic effusion, increased since yesterday's study. Progressive right basilar atelectasis.  Hepatobiliary: No hepatic lesions or intrahepatic biliary dilatation. High attenuation material in the gallbladder likely vicarious excretion from the chest CT from yesterday. There appear to be some filling defects also suggesting gallstones. No CT findings to suggest acute cholecystitis. No common bile duct dilatation. Pancreas: No mass, inflammation or ductal dilatation. Spleen: Normal size.  No focal lesions. Adrenals/Urinary Tract: Adrenal glands and kidneys are grossly normal. No renal or obstructing ureteral calculi. No bladder calculi. No mass lesions. The bladder contains high attenuation material likely excreted contrast material from yesterday CT scan. Stomach/Bowel: The stomach, duodenum, small bowel and colon are unremarkable. No acute inflammatory changes, mass lesions or obstructive findings. Vascular/Lymphatic: The aorta is normal in caliber. Minimal scattered atheroscerlotic calcifications. No mesenteric of retroperitoneal mass or adenopathy. Small scattered lymph nodes are noted. Reproductive: The prostate gland and seminal vesicles are unremarkable. Other: Small periumbilical abdominal wall hernia containing fat. No free air or free fluid in the abdomen/pelvis. Musculoskeletal: No significant bony findings. IMPRESSION: 1. Progressive dense left lower lobe airspace opacification most likely lobar pneumonia with an associated moderate-sized parapneumonic effusion, increased since yesterday's study. 2. Progressive right basilar atelectasis. 3. No acute abdominal/pelvic findings, mass lesions or adenopathy. 4. Cholelithiasis. Electronically Signed   By: Marijo Sanes M.D.   On: 07/12/2021 14:48   DG Chest 2 View  Result Date: 07/11/2021 CLINICAL DATA:  Shortness of breath and oxygen desaturation. EXAM: CHEST - 2 VIEW COMPARISON:  PA Lat 02/16/2019. FINDINGS: The cardiac size is normal. Central vessels are normal caliber. There is interval low inspiration and development of a moderate layering  left pleural effusion with overlying atelectasis or consolidation of the left lower lung field. The hypoexpanded right lung is clear. No pneumothorax is seen. Thoracic cage is intact. IMPRESSION: New finding of moderate left pleural effusion with overlying atelectasis or consolidation in left lower lung field. Clinical correlation and follow-up recommended. Electronically Signed   By: Telford Nab M.D.   On: 07/11/2021 22:04   CT CHEST W CONTRAST  Result Date: 07/18/2021 CLINICAL DATA:  Left-sided chest pain, shortness of breath, productive cough EXAM: CT CHEST WITH CONTRAST TECHNIQUE: Multidetector CT imaging of the chest was performed during intravenous contrast administration. CONTRAST:  57mL OMNIPAQUE IOHEXOL 300 MG/ML  SOLN COMPARISON:  Chest radiograph most recently dated the same day, CT a chest 07/11/2021 FINDINGS: Cardiovascular: The heart is not enlarged. There is a trace pericardial effusion, new since 07/11/2021. The major vessels of the chest are unremarkable. Mediastinum/Nodes: The thyroid is unremarkable. The esophagus is grossly unremarkable. There is no mediastinal, hilar, or axillary lymphadenopathy. Lungs/Pleura: The trachea and central airways are patent. There is a moderate size left pleural effusion with hypoenhancing left basilar consolidation. Compared to the study from 07/11/2021, these findings are slightly worsened. The right lung is essentially clear with minimal linear opacities in the right base likely reflecting atelectasis. There is no pneumothorax. Upper Abdomen: The imaged portions of the upper abdominal viscera are unremarkable. Musculoskeletal: There is no acute osseous abnormality or aggressive osseous lesion. IMPRESSION: 1. Moderate size left pleural effusion with adjacent consolidation concerning for pneumonia, slightly worsened since the prior CT chest from 07/11/2021. Recommend continued imaging follow-up to resolution. 2. New trace pericardial effusion. Electronically  Signed   By: Valetta Mole M.D.   On: 07/18/2021 11:25   CT Angio Chest PE W and/or Wo Contrast  Result Date: 07/11/2021 CLINICAL DATA:  Shortness of breath x2 days. EXAM: CT ANGIOGRAPHY CHEST WITH CONTRAST TECHNIQUE: Multidetector CT imaging of the chest was performed using the standard protocol during bolus administration of intravenous contrast. Multiplanar CT image reconstructions and MIPs were obtained to evaluate the vascular anatomy. CONTRAST:  144mL OMNIPAQUE IOHEXOL 350 MG/ML SOLN COMPARISON:  None. FINDINGS: Cardiovascular: The subsegmental pulmonary arteries are limited in evaluation secondary to suboptimal opacification with intravenous contrast as well as areas of overlying artifact. No evidence of pulmonary embolism. Normal heart size with mild coronary artery calcification. No pericardial effusion. Mediastinum/Nodes: No enlarged mediastinal, hilar, or axillary lymph nodes. Thyroid gland, trachea, and esophagus demonstrate no significant findings. Lungs/Pleura: Moderate to marked severity atelectasis and/or infiltrate is seen within the left lower lobe and along the periphery of the left upper lobe. Mild posterior right basilar atelectasis and/or infiltrate is also seen. A small to moderate size left-sided pleural effusion is seen. No pneumothorax is identified. Upper Abdomen: No acute abnormality. Musculoskeletal: No chest wall abnormality. No acute or significant osseous findings. Review of the MIP images confirms the above findings. IMPRESSION: 1. Moderate to marked severity left lower lobe and left upper lobe atelectasis and/or infiltrate. 2. Mild posterior right basilar atelectasis and/or infiltrate. 3. Small to moderate size left-sided pleural effusion. 4. Limited evaluation of the pulmonary arteries, as described above, without definite evidence of pulmonary embolism. Electronically Signed   By: Virgina Norfolk M.D.   On: 07/11/2021 23:14   [place] CHEST (PLEURAL EFFUSION)  Result Date:  07/12/2021 CLINICAL DATA:  Left pleural effusion. EXAM: CHEST ULTRASOUND COMPARISON:  July 11, 2021. FINDINGS: Sonographic evaluation of the left hemithorax demonstrates small and somewhat complex appearing left pleural effusion. Adequate fluid pocket for thoracentesis is not visualized. IMPRESSION: Small and somewhat complex appearing left pleural effusion is noted. Electronically Signed   By: Marijo Conception M.D.   On: 07/12/2021 10:38   DG CHEST PORT 1 VIEW  Result Date: 07/18/2021 CLINICAL DATA:  Community acquired pneumonia EXAM: PORTABLE CHEST 1 VIEW COMPARISON:  07/16/2021 FINDINGS: Heart is normal size. Right lung remains clear. Moderate left pleural effusion with left lower lobe airspace disease, unchanged. No acute bony abnormality. IMPRESSION: Continued moderate left effusion with left lower lobe airspace disease. Electronically Signed   By: Rolm Baptise M.D.   On: 07/18/2021 08:12  DG CHEST PORT 1 VIEW  Result Date: 07/16/2021 CLINICAL DATA:  Pneumonia EXAM: PORTABLE CHEST 1 VIEW COMPARISON:  Chest x-ray 07/14/2021 FINDINGS: Heart size appears normal. Mediastinum is within normal limits. Right lung and pleura remain clear. Interval improving aeration of the left upper lung zone and decreased size of a now moderate left pleural effusion. No pneumothorax. IMPRESSION: Improvement since previous study. Persistent moderate left pleural effusion with associated atelectasis/infiltrate. Electronically Signed   By: Ofilia Neas M.D.   On: 07/16/2021 08:20   DG Chest Port 1 View  Result Date: 07/14/2021 CLINICAL DATA:  Shortness of breath, ICU EXAM: PORTABLE CHEST 1 VIEW COMPARISON:  Chest radiograph dated 1 day prior FINDINGS: The cardiomediastinal silhouette is suboptimally evaluated. A large left pleural effusion with near complete opacification of the left hemithorax is again seen with a small amount of aerated left upper lobe, not significantly changed compared to the study from 1  day prior. There is a possible trace right pleural effusion. The right lung otherwise remains clear. There is no right effusion. There is no pneumothorax. The bones are stable. IMPRESSION: 1. Large left pleural effusion with a small amount of aerated left upper lobe is not significantly changed compared to the study from 1 day prior. 2. Unchanged possible trace right pleural effusion. Electronically Signed   By: Valetta Mole M.D.   On: 07/14/2021 10:45   DG Chest Port 1 View  Result Date: 07/13/2021 CLINICAL DATA:  Shortness of breath, left pleural effusion EXAM: PORTABLE CHEST 1 VIEW COMPARISON:  Previous studies including the examination of 07/12/2021 FINDINGS: There is further increase in size of left pleural effusion with almost complete opacification. There is decreased aeration in the left lung with small area of aeration in the left upper lung fields. Increased density in the right lower lung fields Gonyea be due to pleural effusion and possibly underlying atelectasis. There is no pneumothorax. IMPRESSION: There is increase in amount of large left pleural effusion with almost complete atelectasis of left lung with small residual area of aeration in the left upper lung fields. Increased density in the right lower lung fields suggests small effusion and possibly underlying atelectasis with slight improvement. Electronically Signed   By: Elmer Picker M.D.   On: 07/13/2021 08:11   DG CHEST PORT 1 VIEW  Result Date: 07/12/2021 CLINICAL DATA:  Increased shortness of breath EXAM: PORTABLE CHEST 1 VIEW COMPARISON:  Chest x-ray dated July 11, 2021 FINDINGS: Visualized cardiac and mediastinal contours are unchanged. Small right and large left pleural effusions with associated atelectasis, increased compared to prior exam. New rightward deviation of the trachea. No evidence of pneumothorax. IMPRESSION: 1. Small right and large left pleural effusions with associated atelectasis, increased compared to  prior exam. 2. New rightward deviation of the trachea, likely related to large left pleural effusion. Electronically Signed   By: Yetta Glassman M.D.   On: 07/12/2021 14:05   US Abdomen Limited RUQ (LIVER/GB)  Result Date: 07/18/2021 CLINICAL DATA:  Elevated LFTs EXAM: ULTRASOUND ABDOMEN LIMITED RIGHT UPPER QUADRANT COMPARISON:  CT abdomen and pelvis 07/12/2021 FINDINGS: Gallbladder: Mobile echogenic shadowing calculi. No significant wall thickening or pericholecystic fluid. No sonographic Murphy sign. Common bile duct: Diameter: 5 Liver: Mildly increased echogenicity with no focal mass identified. Portal vein is patent on color Doppler imaging with normal direction of blood flow towards the liver. Other: None. IMPRESSION: 1. Cholelithiasis. 2. Mildly increased echogenicity of the liver parenchyma which Wieck represent hepatic steatosis and/or other hepatocellular disease. Electronically  Signed   By: Ofilia Neas M.D.   On: 07/18/2021 10:34    Labs:  CBC: Recent Labs    07/14/21 0449 07/17/21 0428 07/18/21 0437 07/19/21 0127  WBC 25.0* 22.4* 14.8* 15.1*  HGB 13.0 12.2* 13.1 13.4  HCT 40.0 37.1* 40.1 41.0  PLT 392 391 375 384    COAGS: Recent Labs    07/11/21 2130  INR 1.0    BMP: Recent Labs    07/14/21 0449 07/17/21 0428 07/18/21 0437 07/19/21 0127  NA 136 139 140 136  K 3.7 3.9 3.9 3.9  CL 101 102 101 99  CO2 26 30 31 29   GLUCOSE 149* 160* 100* 119*  BUN 15 16 18 12   CALCIUM 8.5* 8.2* 8.1* 8.0*  CREATININE 0.78 0.75 0.86 0.85  GFRNONAA >60 >60 >60 >60    LIVER FUNCTION TESTS: Recent Labs    07/12/21 0331 07/17/21 0428 07/18/21 0437 07/19/21 0127  BILITOT 0.7 0.2* 0.3 0.4  AST 26 100* 81* 38  ALT 28 267* 253* 179*  ALKPHOS 98 173* 180* 168*  PROT 7.0 6.2* 6.3* 6.2*  ALBUMIN 3.2* 2.2* 2.2* 2.1*    TUMOR MARKERS: No results for input(s): AFPTM, CEA, CA199, CHROMGRNA in the last 8760 hours.  Assessment and Plan: Loculated left pleural effusion For  image guided chest tube placement Labs reviewed Risks and benefits discussed with the patient including bleeding, infection, damage to adjacent structures, lung perforation/fistula connection, and sepsis.  All of the patient's questions were answered, patient is agreeable to proceed. Consent signed and in chart.   Thank you for this interesting consult.  I greatly enjoyed meeting Victor [name] and look forward to participating in their care.  A copy of this report was sent to the requesting provider on this date.  Electronically Signed: Ascencion Dike, PA-C 07/19/2021, 9:17 AM   I spent a total of 20 minutes in face to face in clinical consultation, greater than 50% of which was counseling/coordinating care for left chest drain

## 2021-07-19 NOTE — Progress Notes (Signed)
NAME:  Victor [name], MRN:  [id number], DOB:  07-16-1976, LOS: 7 ADMISSION DATE:  07/11/2021, CONSULTATION DATE:  07/12/2021 REFERRING MD:  Dr. Carles Collet, [facility], CHIEF COMPLAINT:  Short of breath   History of Present Illness:  45 yo male smoker developed pain in his left chest on 07/06/21.  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  He then developed cough with rust colored sputum, chills, sweats, and shakes.  Appetite has decreased.  Has pleuritic pain on left side causing splinting with respiration.  In ER he had SpO2 86% on room air.  CT chest showed LLL infiltrate and small effusion.  He was started on antibiotics, supplemental oxygen and solumedrol.  PCCM asked to assist with management.  Pertinent  Medical History  GERD, HTN, Stomach ulcer  Significant Hospital Events: Including procedures, antibiotic start and stop dates in addition to other pertinent events   11/16 admit 11/17 add solumedrol for pleurisy> d/c'd 11/21  11/24 Left pigtail placement per IR, ceftriaxone changed to unasyn/ levaquin  Studies:  CT angio chest 07/11/21 >> LLL infiltrate, small Lt pleural effusion U/S chest 07/12/21 >> small, complex effusion Urinary strep 11/16 neg Urinary legionella 11/16 neg  MRSA PCR screen 11/17 neg  CT with contrast 11/23>>>moderate left pleural effusion with adjacent consolidation, slightly worse from prior CT on 11/16, new trace pericardial effusion  Scheduled Meds:  arformoterol  15 mcg Nebulization BID   budesonide (PULMICORT) nebulizer solution  0.5 mg Nebulization BID   calcium carbonate  1 tablet Oral Q breakfast   carvedilol  6.25 mg Oral BID WC   Chlorhexidine Gluconate Cloth  6 each Topical Daily   dextromethorphan-guaiFENesin  1 tablet Oral BID   docusate sodium  100 mg Oral BID   enoxaparin (LOVENOX) injection  60 mg Subcutaneous Daily   ipratropium  0.5 mg Nebulization BID   levalbuterol  0.63 mg Nebulization BID   levofloxacin   500 mg Oral Daily   lidocaine  1 patch Transdermal Q24H   nicotine  21 mg Transdermal Daily   sodium chloride flush  10 mL Other Q8H   Continuous Infusions:  ampicillin-sulbactam (UNASYN) IV 3 g (07/19/21 1210)   PRN Meds:.acetaminophen **OR** acetaminophen, acetaminophen, HYDROmorphone (DILAUDID) injection, levalbuterol, methocarbamol, oxyCODONE, sodium chloride   Interim History / Subjective:  On 2L [place] S/p left pigtail CT placement by IR C/o of intermittent sharp left pleuritic pain  Objective   Blood pressure 107/79, pulse 88, temperature 98.6 F (37 C), temperature source Oral, resp. rate 15, height 5\' 11"  (1.803 m), weight 113.2 kg, SpO2 92 %.    FiO2 (%):  [32 %] 32 %   Intake/Output Summary (Last 24 hours) at 07/19/2021 1420 Last data filed at 07/18/2021 2104 Gross per 24 hour  Intake 100 ml  Output --  Net 100 ml   Filed Weights   07/11/21 2058 07/12/21 0515  Weight: 113.9 kg 113.2 kg   Examination: General:  Adult male sitting upright in bed in NAD HEENT: MM pink/moist Neuro:  Aox3, MAE CV: rr PULM:  non labored, clear anteriorly, diminished bibasilar, no wheeze, left posterior pigtail CT with serous drainage to 20 cm sxn, no air leak GI: obese, soft, bs+ Extremities: warm/dry, no LE edema   Resolved Hospital Problem list     Assessment & Plan:   Bacterial community acquired pneumonia with lobar consolidation of LLL. Lt pleuritic chest pain secondary to loculated L effusion  - s/p chest CT 11/23  revealing moderate left pleural effusion with adjacent consolidation, slightly worse from prior CT on 11/16, new trace pericardial effusion - s/p left pigtail ct placement 11/24 >> sending pleural studies, culture, cytology and serum studies   - follow pigtail CT output - CXR in am  - follow expectorated sputum cx  - changed from ceftriaxone > to Unasyn and Levaquin.  Trend WBC/ fever curve     Acute hypoxic respiratory failure secondary to CAP/ atx from  effusion, improving  - continue aggressive pulm hygiene, mobilize, IS - wean O2 for gaol SpO2 > 92% - continue xopenex, pulmicort/ brovana nebs  Pulmonary will continue to follow  Labs    CMP Latest Ref Rng & Units 07/19/2021 07/19/2021 07/18/2021  Glucose 70 - 99 mg/dL - 119(H) 100(H)  BUN 6 - 20 mg/dL - 12 18  Creatinine 0.61 - 1.24 mg/dL - 0.85 0.86  Sodium 135 - 145 mmol/L - 136 140  Potassium 3.5 - 5.1 mmol/L - 3.9 3.9  Chloride 98 - 111 mmol/L - 99 101  CO2 22 - 32 mmol/L - 29 31  Calcium 8.9 - 10.3 mg/dL - 8.0(L) 8.1(L)  Total Protein 6.5 - 8.1 g/dL 6.5 6.2(L) 6.3(L)  Total Bilirubin 0.3 - 1.2 mg/dL - 0.4 0.3  Alkaline Phos 38 - 126 U/L - 168(H) 180(H)  AST 15 - 41 U/L - 38 81(H)  ALT 0 - 44 U/L - 179(H) 253(H)    CBC Latest Ref Rng & Units 07/19/2021 07/18/2021 07/17/2021  WBC 4.0 - 10.5 K/uL 15.1(H) 14.8(H) 22.4(H)  Hemoglobin 13.0 - 17.0 g/dL 13.4 13.1 12.2(L)  Hematocrit 39.0 - 52.0 % 41.0 40.1 37.1(L)  Platelets 150 - 400 K/uL 384 375 391      Kennieth Rad, ACNP [facility] 07/19/2021, 2:21 PM  See Amion for pager If no response to pager, please call PCCM consult pager After 7:00 pm call Elink

## 2021-07-19 NOTE — Plan of Care (Signed)

## 2021-07-20 ENCOUNTER — Inpatient Hospital Stay (HOSPITAL_COMMUNITY): Payer: Medicaid Other

## 2021-07-20 ENCOUNTER — Encounter (HOSPITAL_COMMUNITY): Payer: Self-pay

## 2021-07-20 DIAGNOSIS — J90 Pleural effusion, not elsewhere classified: Secondary | ICD-10-CM | POA: Diagnosis not present

## 2021-07-20 DIAGNOSIS — J189 Pneumonia, unspecified organism: Secondary | ICD-10-CM | POA: Diagnosis not present

## 2021-07-20 DIAGNOSIS — J9601 Acute respiratory failure with hypoxia: Secondary | ICD-10-CM | POA: Diagnosis not present

## 2021-07-20 LAB — CBC WITH DIFFERENTIAL/PLATELET
Abs Immature Granulocytes: 0.8 10*3/uL — ABNORMAL HIGH (ref 0.00–0.07)
Band Neutrophils: 2 %
Basophils Absolute: 0 10*3/uL (ref 0.0–0.1)
Basophils Relative: 0 %
Eosinophils Absolute: 0.2 10*3/uL (ref 0.0–0.5)
Eosinophils Relative: 1 %
HCT: 48 % (ref 39.0–52.0)
Hemoglobin: 15.3 g/dL (ref 13.0–17.0)
Lymphocytes Relative: 14 %
Lymphs Abs: 2.9 10*3/uL (ref 0.7–4.0)
MCH: 28.6 pg (ref 26.0–34.0)
MCHC: 31.9 g/dL (ref 30.0–36.0)
MCV: 89.7 fL (ref 80.0–100.0)
Monocytes Absolute: 1.3 10*3/uL — ABNORMAL HIGH (ref 0.1–1.0)
Monocytes Relative: 6 %
Myelocytes: 4 %
Neutro Abs: 15.7 10*3/uL — ABNORMAL HIGH (ref 1.7–7.7)
Neutrophils Relative %: 73 %
Platelets: 425 10*3/uL — ABNORMAL HIGH (ref 150–400)
RBC: 5.35 MIL/uL (ref 4.22–5.81)
RDW: 14.6 % (ref 11.5–15.5)
WBC: 20.9 10*3/uL — ABNORMAL HIGH (ref 4.0–10.5)
nRBC: 0 % (ref 0.0–0.2)
nRBC: 0 /100 WBC

## 2021-07-20 LAB — COMPREHENSIVE METABOLIC PANEL
ALT: 148 U/L — ABNORMAL HIGH (ref 0–44)
AST: 50 U/L — ABNORMAL HIGH (ref 15–41)
Albumin: 2.3 g/dL — ABNORMAL LOW (ref 3.5–5.0)
Alkaline Phosphatase: 174 U/L — ABNORMAL HIGH (ref 38–126)
Anion gap: 10 (ref 5–15)
BUN: 17 mg/dL (ref 6–20)
CO2: 24 mmol/L (ref 22–32)
Calcium: 8.2 mg/dL — ABNORMAL LOW (ref 8.9–10.3)
Chloride: 98 mmol/L (ref 98–111)
Creatinine, Ser: 0.92 mg/dL (ref 0.61–1.24)
GFR, Estimated: >60 mL/min (ref >60)
Glucose, Bld: 110 mg/dL — ABNORMAL HIGH (ref 70–99)
Potassium: 5.1 mmol/L (ref 3.5–5.1)
Sodium: 132 mmol/L — ABNORMAL LOW (ref 135–145)
Total Bilirubin: 0.9 mg/dL (ref 0.3–1.2)
Total Protein: 6.3 g/dL — ABNORMAL LOW (ref 6.5–8.1)

## 2021-07-20 LAB — C-REACTIVE PROTEIN: CRP: 13.1 mg/dL — ABNORMAL HIGH (ref <1.0)

## 2021-07-20 LAB — BRAIN NATRIURETIC PEPTIDE: B Natriuretic Peptide: 4 pg/mL (ref 0.0–100.0)

## 2021-07-20 LAB — PROCALCITONIN: Procalcitonin: 0.31 ng/mL

## 2021-07-20 LAB — MAGNESIUM: Magnesium: 2.3 mg/dL (ref 1.7–2.4)

## 2021-07-20 MED ORDER — SODIUM CHLORIDE (PF) 0.9 % IJ SOLN
10.0000 mg | INTRAMUSCULAR | Status: AC
  Administered 2021-07-20: 10 mg via INTRAPLEURAL
  Filled 2021-07-20: qty 10

## 2021-07-20 MED ORDER — STERILE WATER FOR INJECTION IJ SOLN
5.0000 mg | RESPIRATORY_TRACT | Status: AC
  Administered 2021-07-20: 5 mg via INTRAPLEURAL
  Filled 2021-07-20: qty 5

## 2021-07-20 NOTE — Progress Notes (Signed)
PROGRESS NOTE                                                                                                                                                                                                             Patient Demographics:    Victor [name], is a 45 y.o. male, DOB - 04-Mar-1976, BWG:[id number]  Outpatient Primary MD for the patient is [name], DO    LOS - 8  Admit date - 07/11/2021    Chief Complaint  Patient presents with   Shortness of Breath       Brief Narrative (HPI from H&P)   45 year old male with a history of hypertension, hyperlipidemia, tobacco abuse, GERD, chronic knee pain status post right TKA and multiple revisions, on home narcotics and as needed Narcan, he presented to the [facility] with 1 week history of  cough shortness of breath along with left-sided pleuritic chest pain, he was diagnosed with pneumonia and admitted to the hospital on 07/11/2021, by that time he had symptoms for 1 week already.  Despite appropriate treatment he continued to have left therapeutic chest pain CT scan demonstrated moderate to large left-sided pleural effusion around the infiltrate suspicious for parapneumonic effusion versus empyema he was transferred to [facility] for further care.   Subjective:   Patient in bed, appears comfortable, denies any headache, no fever, no abdominal pain. No new focal weakness.  Positive left-sided pleuritic chest pain at the site of pigtail catheter with some shortness of breath    Assessment  & Plan :    1. Acute Hypoxic Resp. Failure due to CAP with L. Sided infiltrate and surrounding moderate to large effusion - no history of travel or sick contact exposure, he does take some narcotics at home for chronic knee pain, question if he at some point had some microaspiration which she does not recall, has been treated with empiric IV antibiotics for a week without  much improvement.  For now we will switch him to Unasyn and Levaquin covering for microaspiration and atypical bugs, pulmonary on board.  He underwent left pigtail catheter placement by IR on 07/19/2021, follow culture results, fluid appears parapneumonic versus empyema, tPA administered via pigtail by pulmonary on 07/20/2021 continue to monitor closely.  2.  Multiple right knee surgeries with chronic pain.  Supportive care.  On  narcotics at home.  3.  Hypertension.  Placed on Coreg will monitor.  4.  Smoking.  Counseled to quit smoking has 30-pack-year history.  5.  Incidental finding of fatty liver with obesity.  BMI of 34.  Follow with PCP for weight loss outpatient GI follow-up to be arranged by PCP.  6.  Trace pericardial effusion noted on CT scan.  Check echocardiogram, pending.  Symptom-free right now.      Condition - fair  Family Communication  :  Family bedside  Code Status :  Full  Consults  :  PCCM  PUD Prophylaxis :    Procedures  :     CT 07/18/21 - 1. Moderate size left pleural effusion with adjacent consolidation concerning for pneumonia, slightly worsened since the prior CT chest from 07/11/2021. Recommend continued imaging follow-up to resolution. 2. New trace pericardial effusion.  Right upper quadrant ultrasound.  Gallstones and fatty liver  L. Lung Pigtail catheter placed by IR 07/19/21  Echocardiogram.      Disposition Plan  :    Status is: Inpatient  Remains inpatient appropriate because: Evaluation of left parapneumonic effusion/empyema [place] require pigtail catheter placement.  DVT Prophylaxis  :  Lovenox  Place TED hose Start: 07/17/21 1214 SCDs Start: 07/12/21 0443    Lab Results  Component Value Date   PLT 425 (H) 07/20/2021    Diet :  Diet Order             Diet Heart Room service appropriate? Yes; Fluid consistency: Thin  Diet effective now                    Inpatient Medications  Scheduled Meds:  arformoterol  15 mcg  Nebulization BID   budesonide (PULMICORT) nebulizer solution  0.5 mg Nebulization BID   calcium carbonate  1 tablet Oral Q breakfast   carvedilol  6.25 mg Oral BID WC   Chlorhexidine Gluconate Cloth  6 each Topical Daily   dextromethorphan-guaiFENesin  1 tablet Oral BID   docusate sodium  100 mg Oral BID   enoxaparin (LOVENOX) injection  60 mg Subcutaneous Daily   ipratropium  0.5 mg Nebulization BID   levalbuterol  0.63 mg Nebulization BID   levofloxacin  500 mg Oral Daily   lidocaine  1 patch Transdermal Q24H   nicotine  21 mg Transdermal Daily   sodium chloride flush  10 mL Other Q8H   Continuous Infusions:  ampicillin-sulbactam (UNASYN) IV 3 g (07/20/21 0218)   PRN Meds:.acetaminophen **OR** acetaminophen, acetaminophen, HYDROmorphone (DILAUDID) injection, levalbuterol, methocarbamol, oxyCODONE, sodium chloride  Time Spent in minutes  30   Lala Lund M.D on 07/20/2021 at 9:40 AM  To page go to [email or web address]   [facility] -  Office  [phone]  See all Orders from today for further details    Objective:   Vitals:   07/20/21 0415 07/20/21 0734 07/20/21 0855 07/20/21 0859  BP: 131/87 140/89    Pulse: 97 95    Resp: 20 18    Temp: 98.3 F (36.8 C) 98.4 F (36.9 C)    TempSrc:  Axillary    SpO2: 91% 92% 92% (!) 87%  Weight:      Height:        Wt Readings from Last 3 Encounters:  07/12/21 113.2 kg  07/02/21 113.9 kg  03/29/21 113 kg     Intake/Output Summary (Last 24 hours) at 07/20/2021 0940 Last data filed at 07/20/2021 0800 Gross per 24 hour  Intake  350 ml  Output 2420 ml  Net -2070 ml     Physical Exam  Awake Alert, No new F.N deficits, Normal affect Fate.AT,PERRAL Supple Neck, No JVD,   Symmetrical Chest wall movement, reduced left basilar breath sounds with left pigtail catheter in place RRR,No Gallops, Rubs or new Murmurs,  +ve B.Sounds, Abd Soft, No tenderness,   No Cyanosis, Clubbing or edema        Data Review:     CBC Recent Labs  Lab 07/14/21 0449 07/17/21 0428 07/18/21 0437 07/19/21 0127 07/20/21 0101  WBC 25.0* 22.4* 14.8* 15.1* 20.9*  HGB 13.0 12.2* 13.1 13.4 15.3  HCT 40.0 37.1* 40.1 41.0 48.0  PLT 392 391 375 384 425*  MCV 91.1 90.5 91.1 88.7 89.7  MCH 29.6 29.8 29.8 29.0 28.6  MCHC 32.5 32.9 32.7 32.7 31.9  RDW 13.8 14.4 14.5 14.6 14.6  LYMPHSABS  --   --   --   --  2.9  MONOABS  --   --   --   --  1.3*  EOSABS  --   --   --   --  0.2  BASOSABS  --   --   --   --  0.0    Electrolytes Recent Labs  Lab 07/14/21 0449 07/17/21 0428 07/18/21 0437 07/19/21 0127 07/19/21 0746 07/20/21 0101  NA 136 139 140 136  --  132*  K 3.7 3.9 3.9 3.9  --  5.1  CL 101 102 101 99  --  98  CO2 26 30 31 29   --  24  GLUCOSE 149* 160* 100* 119*  --  110*  BUN 15 16 18 12   --  17  CREATININE 0.78 0.75 0.86 0.85  --  0.92  CALCIUM 8.5* 8.2* 8.1* 8.0*  --  8.2*  AST  --  100* 81* 38  --  50*  ALT  --  267* 253* 179*  --  148*  ALKPHOS  --  173* 180* 168*  --  174*  BILITOT  --  0.2* 0.3 0.4  --  0.9  ALBUMIN  --  2.2* 2.2* 2.1*  --  2.3*  MG  --   --   --  2.3  --  2.3  CRP  --   --   --   --  7.7* 13.1*  PROCALCITON 1.60  --   --   --  0.12 0.31  BNP  --  107.0*  --   --  6.0 4.0    ------------------------------------------------------------------------------------------------------------------ No results for input(s): CHOL, HDL, LDLCALC, TRIG, CHOLHDL, LDLDIRECT in the last 72 hours.  Lab Results  Component Value Date   HGBA1C 5.4 03/03/2018    No results for input(s): TSH, T4TOTAL, T3FREE, THYROIDAB in the last 72 hours.  Invalid input(s): FREET3 ------------------------------------------------------------------------------------------------------------------ ID Labs Recent Labs  Lab 07/14/21 0449 07/17/21 0428 07/18/21 0437 07/19/21 0127 07/19/21 0746 07/20/21 0101  WBC 25.0* 22.4* 14.8* 15.1*  --  20.9*  PLT 392 391 375 384  --  425*  CRP  --   --   --   --  7.7*  13.1*  PROCALCITON 1.60  --   --   --  0.12 0.31  CREATININE 0.78 0.75 0.86 0.85  --  0.92   Cardiac Enzymes No results for input(s): CKMB, TROPONINI, MYOGLOBIN in the last 168 hours.  Invalid input(s): CK    Radiology Reports CT CHEST W CONTRAST  Result Date: 07/18/2021 CLINICAL DATA:  Left-sided chest  pain, shortness of breath, productive cough EXAM: CT CHEST WITH CONTRAST TECHNIQUE: Multidetector CT imaging of the chest was performed during intravenous contrast administration. CONTRAST:  56mL OMNIPAQUE IOHEXOL 300 MG/ML  SOLN COMPARISON:  Chest radiograph most recently dated the same day, CT a chest 07/11/2021 FINDINGS: Cardiovascular: The heart is not enlarged. There is a trace pericardial effusion, new since 07/11/2021. The major vessels of the chest are unremarkable. Mediastinum/Nodes: The thyroid is unremarkable. The esophagus is grossly unremarkable. There is no mediastinal, hilar, or axillary lymphadenopathy. Lungs/Pleura: The trachea and central airways are patent. There is a moderate size left pleural effusion with hypoenhancing left basilar consolidation. Compared to the study from 07/11/2021, these findings are slightly worsened. The right lung is essentially clear with minimal linear opacities in the right base likely reflecting atelectasis. There is no pneumothorax. Upper Abdomen: The imaged portions of the upper abdominal viscera are unremarkable. Musculoskeletal: There is no acute osseous abnormality or aggressive osseous lesion. IMPRESSION: 1. Moderate size left pleural effusion with adjacent consolidation concerning for pneumonia, slightly worsened since the prior CT chest from 07/11/2021. Recommend continued imaging follow-up to resolution. 2. New trace pericardial effusion. Electronically Signed   By: Valetta Mole M.D.   On: 07/18/2021 11:25   DG CHEST PORT 1 VIEW  Result Date: 07/20/2021 CLINICAL DATA:  Chest tube placement EXAM: PORTABLE CHEST 1 VIEW COMPARISON:  Chest  radiograph 07/19/2021 FINDINGS: The left sided pigtail catheter projects over the left base, unchanged. The cardiomediastinal silhouette is grossly stable. The moderate size left pleural effusion layering along the left chest wall is similar in size to the study from 1 day prior, though aeration of the left lung appears slightly improved. There is no pneumothorax. The right lung is clear. There is no right pleural effusion or pneumothorax. There is no acute osseous abnormality. IMPRESSION: Left chest tube in place with similar size of the left pleural effusion layering along the chest wall but slightly improved aeration of the left lung. No pneumothorax. Electronically Signed   By: Valetta Mole M.D.   On: 07/20/2021 07:27   DG CHEST PORT 1 VIEW  Result Date: 07/19/2021 CLINICAL DATA:  Follow-up of pleural effusion.  Chest tube in place. EXAM: PORTABLE CHEST 1 VIEW COMPARISON:  07/18/2021 FINDINGS: Left-sided pleural drain/pigtail catheter. Midline trachea. Normal heart size. Loculated moderate left pleural effusion is similar. No pneumothorax. Low lung volumes with resultant pulmonary interstitial prominence. Mild subsegmental atelectasis in the medial right lung base. Increase in left lower lobe airspace disease. IMPRESSION: Left-sided pleural drain in place, with persistent loculated pleural fluid but no evidence of pneumothorax. Diminished lung volumes. Increased left base atelectasis or infection. Electronically Signed   By: Abigail Miyamoto M.D.   On: 07/19/2021 16:20   DG CHEST PORT 1 VIEW  Result Date: 07/18/2021 CLINICAL DATA:  Community acquired pneumonia EXAM: PORTABLE CHEST 1 VIEW COMPARISON:  07/16/2021 FINDINGS: Heart is normal size. Right lung remains clear. Moderate left pleural effusion with left lower lobe airspace disease, unchanged. No acute bony abnormality. IMPRESSION: Continued moderate left effusion with left lower lobe airspace disease. Electronically Signed   By: Rolm Baptise M.D.    On: 07/18/2021 08:12   US Abdomen Limited RUQ (LIVER/GB)  Result Date: 07/18/2021 CLINICAL DATA:  Elevated LFTs EXAM: ULTRASOUND ABDOMEN LIMITED RIGHT UPPER QUADRANT COMPARISON:  CT abdomen and pelvis 07/12/2021 FINDINGS: Gallbladder: Mobile echogenic shadowing calculi. No significant wall thickening or pericholecystic fluid. No sonographic Murphy sign. Common bile duct: Diameter: 5 Liver: Mildly increased echogenicity  with no focal mass identified. Portal vein is patent on color Doppler imaging with normal direction of blood flow towards the liver. Other: None. IMPRESSION: 1. Cholelithiasis. 2. Mildly increased echogenicity of the liver parenchyma which Plourde represent hepatic steatosis and/or other hepatocellular disease. Electronically Signed   By: Ofilia Neas M.D.   On: 07/18/2021 10:34   [place] PERC PLEURAL DRAIN W/INDWELL CATH W/IMG GUIDE  Result Date: 07/19/2021 INDICATION: 45 year old gentleman with loculated left pleural effusion presents to IR for chest tube placement. EXAM: Ultrasound-guided left chest tube placement MEDICATIONS: The patient is currently admitted to the hospital and receiving intravenous antibiotics. The antibiotics were administered within an appropriate time frame prior to the initiation of the procedure. ANESTHESIA/SEDATION: Moderate (conscious) sedation was employed during this procedure. A total of Versed 2 mg and Fentanyl 75 mcg was administered intravenously by the radiology nurse. Total intra-service moderate Sedation Time: 12 minutes. The patient's level of consciousness and vital signs were monitored continuously by radiology nursing throughout the procedure under my direct supervision. COMPLICATIONS: None immediate. PROCEDURE: Informed written consent was obtained from the patient after a thorough discussion of the procedural risks, benefits and alternatives. All questions were addressed. Maximal Sterile Barrier Technique was utilized including caps, mask, sterile  gowns, sterile gloves, sterile drape, hand hygiene and skin antiseptic. A timeout was performed prior to the initiation of the procedure. Patient position seated on the procedure table. Ultrasound evaluation of the left chest demonstrates a small loculated pleural effusion. The overlying skin was prepped and draped in usual fashion. Following local lidocaine a stray shin, the left pleural effusion was accessed with a 19 gauge Yueh needle utilizing continuous ultrasound guidance. Yueh catheter removed over 0.035 inch Amplatz guidewire. Serial dilation was performed and 12 [place] multipurpose pigtail drain was inserted over the guidewire. Drain secured to skin with suture and connected to pleura vac. IMPRESSION: 12 [place] multipurpose pigtail drain placed in loculated left pleural effusion. Electronically Signed   By: Miachel Roux M.D.   On: 07/19/2021 12:13

## 2021-07-20 NOTE — Progress Notes (Signed)
NAME:  Victor [name], MRN:  [id number], DOB:  1976-03-17, LOS: 8 ADMISSION DATE:  07/11/2021, CONSULTATION DATE:  07/12/2021 REFERRING MD:  Dr. Carles Collet, [facility], CHIEF COMPLAINT:  Short of breath   History of Present Illness:  45 yo male smoker developed pain in his left chest on 07/06/21.  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  He then developed cough with rust colored sputum, chills, sweats, and shakes.  Appetite has decreased.  Has pleuritic pain on left side causing splinting with respiration.  In ER he had SpO2 86% on room air.  CT chest showed LLL infiltrate and small effusion.  He was started on antibiotics, supplemental oxygen and solumedrol.  PCCM asked to assist with management.  Pertinent  Medical History  GERD, HTN, Stomach ulcer  Significant Hospital Events: Including procedures, antibiotic start and stop dates in addition to other pertinent events   11/16 admit 11/17 add solumedrol for pleurisy> d/c'd 11/21  11/24 Left pigtail placement per IR, ceftriaxone changed to unasyn/ levaquin  Studies:  CT angio chest 07/11/21 >> LLL infiltrate, small Lt pleural effusion U/S chest 07/12/21 >> small, complex effusion Urinary strep 11/16 neg Urinary legionella 11/16 neg  MRSA PCR screen 11/17 neg  CT with contrast 11/23>>>moderate left pleural effusion with adjacent consolidation, slightly worse from prior CT on 11/16, new trace pericardial effusion  Scheduled Meds:  arformoterol  15 mcg Nebulization BID   budesonide (PULMICORT) nebulizer solution  0.5 mg Nebulization BID   calcium carbonate  1 tablet Oral Q breakfast   carvedilol  6.25 mg Oral BID WC   Chlorhexidine Gluconate Cloth  6 each Topical Daily   dextromethorphan-guaiFENesin  1 tablet Oral BID   docusate sodium  100 mg Oral BID   enoxaparin (LOVENOX) injection  60 mg Subcutaneous Daily   ipratropium  0.5 mg Nebulization BID   levalbuterol  0.63 mg Nebulization BID   levofloxacin   500 mg Oral Daily   lidocaine  1 patch Transdermal Q24H   nicotine  21 mg Transdermal Daily   sodium chloride flush  10 mL Other Q8H   Continuous Infusions:  ampicillin-sulbactam (UNASYN) IV 3 g (07/20/21 1107)   PRN Meds:.acetaminophen **OR** acetaminophen, acetaminophen, HYDROmorphone (DILAUDID) injection, levalbuterol, methocarbamol, oxyCODONE, sodium chloride   Interim History / Subjective:  No acute events overnight.   Was given 1st dose of TPA/DNAse yesterday with 1L output.   Given second dose this AM.   Objective   Blood pressure 125/84, pulse 100, temperature 98.5 F (36.9 C), temperature source Axillary, resp. rate 17, height 5\' 11"  (1.803 m), weight 113.2 kg, SpO2 93 %.        Intake/Output Summary (Last 24 hours) at 07/20/2021 1525 Last data filed at 07/20/2021 0800 Gross per 24 hour  Intake 350 ml  Output 2370 ml  Net -2020 ml   Filed Weights   07/11/21 2058 07/12/21 0515  Weight: 113.9 kg 113.2 kg   Examination: General:  no acute distress, resting in bed HEENT: MM pink/moist Neuro:  Aox3, MAE CV: rrr, no murmurs PULM:  diminished left breath sounds. No wheezing. Left chest tube to -82mmHg.  GI: obese, soft, bs+ Extremities: warm/dry, no LE edema   Resolved Hospital Problem list     Assessment & Plan:   Bacterial community acquired pneumonia with lobar consolidation of LLL. Lt pleuritic chest pain secondary to loculated L effusion  - s/p chest CT 11/23 revealing moderate left pleural effusion with adjacent  consolidation, slightly worse from prior CT on 11/16, new trace pericardial effusion - s/p left pigtail ct placement 11/24  - Pleural studies concerning for exudative effusion and CXR post CT placement shows loculation so TPA/Dnase therapy initiated 11/24 - Second dose of TPA/DNAse given 11/25 - CXR in am  - follow expectorated sputum cx  - Continue Unasyn and Levaquin.  Trend WBC/ fever curve     Acute hypoxic respiratory failure secondary  to CAP/ atx from effusion, improving  - continue aggressive pulm hygiene, mobilize, IS - wean O2 for gaol SpO2 > 92% - continue xopenex, pulmicort/ brovana nebs  Pulmonary will continue to follow  Labs    CMP Latest Ref Rng & Units 07/20/2021 07/19/2021 07/19/2021  Glucose 70 - 99 mg/dL 110(H) - 119(H)  BUN 6 - 20 mg/dL 17 - 12  Creatinine 0.61 - 1.24 mg/dL 0.92 - 0.85  Sodium 135 - 145 mmol/L 132(L) - 136  Potassium 3.5 - 5.1 mmol/L 5.1 - 3.9  Chloride 98 - 111 mmol/L 98 - 99  CO2 22 - 32 mmol/L 24 - 29  Calcium 8.9 - 10.3 mg/dL 8.2(L) - 8.0(L)  Total Protein 6.5 - 8.1 g/dL 6.3(L) 6.5 6.2(L)  Total Bilirubin 0.3 - 1.2 mg/dL 0.9 - 0.4  Alkaline Phos 38 - 126 U/L 174(H) - 168(H)  AST 15 - 41 U/L 50(H) - 38  ALT 0 - 44 U/L 148(H) - 179(H)    CBC Latest Ref Rng & Units 07/20/2021 07/19/2021 07/18/2021  WBC 4.0 - 10.5 K/uL 20.9(H) 15.1(H) 14.8(H)  Hemoglobin 13.0 - 17.0 g/dL 15.3 13.4 13.1  Hematocrit 39.0 - 52.0 % 48.0 41.0 40.1  Platelets 150 - 400 K/uL 425(H) [address] [place], MD [facility] Office: [phone]   See Amion for personal pager PCCM on call pager [phone] until 7pm. Please call Elink 7p-7a. [phone]

## 2021-07-20 NOTE — Progress Notes (Signed)
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  He presented to the [facility] with 1 week history of  cough shortness of breath along with left-sided pleuritic chest pain.  He was diagnosed with pneumonia and admitted to the hospital on 07/11/2021.   CT scan showed a left-sided pleural effusion around the infiltrate suspicious for parapneumonic effusion versus empyema.  He underwent placement of a pigtail chest tube by Dr. Dwaine Gale on 07/19/21.  He had pleural fibrinolysis by Dr. Erin Fulling and he has drained 1.8 L of fluid.   Subjective:  Sitting up in bed. Wife at bedside. RN in room changing out full pleur-evac.  Allergies: Patient has no known allergies.  Medications: Prior to Admission medications   Medication Sig Start Date End Date Taking? Authorizing Provider  amitriptyline (ELAVIL) 10 MG tablet Take 10 mg by mouth at bedtime. 10/20/20  Yes [provider]  amLODipine (NORVASC) 10 MG tablet Take 1 tablet (10 mg total) by mouth daily. 07/02/21  Yes [name], DO  aspirin EC 325 MG EC tablet Take 1 tablet (325 mg total) by mouth daily with breakfast. 08/12/20  Yes [name], MD  cholecalciferol (VITAMIN D3) 25 MCG (1000 UNIT) tablet Take 1,000 Units by mouth daily.   Yes [provider]  docusate sodium (COLACE) 100 MG capsule Take 100 mg by mouth daily.   Yes [provider]  methocarbamol (ROBAXIN) 500 MG tablet Take 1 tablet (500 mg total) by mouth every 6 (six) hours as needed for muscle spasms. 10/13/20  Yes [name], MD  oxyCODONE-acetaminophen (PERCOCET) 7.5-325 MG  tablet Take 1 tablet by mouth every 6 (six) hours as needed for severe pain.   Yes [provider]  traZODone (DESYREL) 50 MG tablet Take 50 mg by mouth at bedtime.   Yes [provider]  naloxone [facility]) nasal spray 4 mg/0.1 mL  10/20/20   [provider]     Vital Signs: BP 125/84 (BP Location: Left Arm)   Pulse 100   Temp 98.5 F (36.9 C) (Axillary)   Resp 17   Ht 5\' 11"  (1.803 m)   Wt 113.2 kg   SpO2 93%   BMI 34.81 kg/m   Physical Exam Constitutional:      Appearance: Normal appearance.  HENT:     Head: Normocephalic and atraumatic.  Cardiovascular:     Rate and Rhythm: Normal rate.  Pulmonary:     Effort: Pulmonary effort is normal. No respiratory distress.     Comments: Chest tube in place on left. 1.8 L of output since fibrinolysis Abdominal:     Palpations: Abdomen is soft.     Tenderness: There is no abdominal tenderness.  Skin:    General: Skin is warm and dry.  Neurological:     General: No focal deficit present.     Mental Status: He is alert and oriented to person, place, and time.  Psychiatric:        Mood and Affect: Mood normal.        Behavior: Behavior normal.        Thought Content: Thought content normal.        Judgment:  Judgment normal.    Imaging: CT CHEST W CONTRAST  Result Date: 07/18/2021 CLINICAL DATA:  Left-sided chest pain, shortness of breath, productive cough EXAM: CT CHEST WITH CONTRAST TECHNIQUE: Multidetector CT imaging of the chest was performed during intravenous contrast administration. CONTRAST:  21mL OMNIPAQUE IOHEXOL 300 MG/ML  SOLN COMPARISON:  Chest radiograph most recently dated the same day, CT a chest 07/11/2021 FINDINGS: Cardiovascular: The heart is not enlarged. There is a trace pericardial effusion, new since 07/11/2021. The major vessels of the chest are unremarkable. Mediastinum/Nodes: The thyroid is unremarkable. The esophagus is grossly unremarkable. There is no mediastinal, hilar, or  axillary lymphadenopathy. Lungs/Pleura: The trachea and central airways are patent. There is a moderate size left pleural effusion with hypoenhancing left basilar consolidation. Compared to the study from 07/11/2021, these findings are slightly worsened. The right lung is essentially clear with minimal linear opacities in the right base likely reflecting atelectasis. There is no pneumothorax. Upper Abdomen: The imaged portions of the upper abdominal viscera are unremarkable. Musculoskeletal: There is no acute osseous abnormality or aggressive osseous lesion. IMPRESSION: 1. Moderate size left pleural effusion with adjacent consolidation concerning for pneumonia, slightly worsened since the prior CT chest from 07/11/2021. Recommend continued imaging follow-up to resolution. 2. New trace pericardial effusion. Electronically Signed   By: Valetta Mole M.D.   On: 07/18/2021 11:25   DG CHEST PORT 1 VIEW  Result Date: 07/20/2021 CLINICAL DATA:  Chest tube placement EXAM: PORTABLE CHEST 1 VIEW COMPARISON:  Chest radiograph 07/19/2021 FINDINGS: The left sided pigtail catheter projects over the left base, unchanged. The cardiomediastinal silhouette is grossly stable. The moderate size left pleural effusion layering along the left chest wall is similar in size to the study from 1 day prior, though aeration of the left lung appears slightly improved. There is no pneumothorax. The right lung is clear. There is no right pleural effusion or pneumothorax. There is no acute osseous abnormality. IMPRESSION: Left chest tube in place with similar size of the left pleural effusion layering along the chest wall but slightly improved aeration of the left lung. No pneumothorax. Electronically Signed   By: Valetta Mole M.D.   On: 07/20/2021 07:27   DG CHEST PORT 1 VIEW  Result Date: 07/19/2021 CLINICAL DATA:  Follow-up of pleural effusion.  Chest tube in place. EXAM: PORTABLE CHEST 1 VIEW COMPARISON:  07/18/2021 FINDINGS:  Left-sided pleural drain/pigtail catheter. Midline trachea. Normal heart size. Loculated moderate left pleural effusion is similar. No pneumothorax. Low lung volumes with resultant pulmonary interstitial prominence. Mild subsegmental atelectasis in the medial right lung base. Increase in left lower lobe airspace disease. IMPRESSION: Left-sided pleural drain in place, with persistent loculated pleural fluid but no evidence of pneumothorax. Diminished lung volumes. Increased left base atelectasis or infection. Electronically Signed   By: Abigail Miyamoto M.D.   On: 07/19/2021 16:20   DG CHEST PORT 1 VIEW  Result Date: 07/18/2021 CLINICAL DATA:  Community acquired pneumonia EXAM: PORTABLE CHEST 1 VIEW COMPARISON:  07/16/2021 FINDINGS: Heart is normal size. Right lung remains clear. Moderate left pleural effusion with left lower lobe airspace disease, unchanged. No acute bony abnormality. IMPRESSION: Continued moderate left effusion with left lower lobe airspace disease. Electronically Signed   By: Rolm Baptise M.D.   On: 07/18/2021 08:12   US Abdomen Limited RUQ (LIVER/GB)  Result Date: 07/18/2021 CLINICAL DATA:  Elevated LFTs EXAM: ULTRASOUND ABDOMEN LIMITED RIGHT UPPER QUADRANT COMPARISON:  CT abdomen and pelvis 07/12/2021 FINDINGS: Gallbladder: Mobile echogenic shadowing calculi. No  significant wall thickening or pericholecystic fluid. No sonographic Murphy sign. Common bile duct: Diameter: 5 Liver: Mildly increased echogenicity with no focal mass identified. Portal vein is patent on color Doppler imaging with normal direction of blood flow towards the liver. Other: None. IMPRESSION: 1. Cholelithiasis. 2. Mildly increased echogenicity of the liver parenchyma which [place] represent hepatic steatosis and/or other hepatocellular disease. Electronically Signed   By: Ofilia Neas M.D.   On: 07/18/2021 10:34   [place] PERC PLEURAL DRAIN W/INDWELL CATH W/IMG GUIDE  Result Date: 07/19/2021 INDICATION: 45 year old  gentleman with loculated left pleural effusion presents to IR for chest tube placement. EXAM: Ultrasound-guided left chest tube placement MEDICATIONS: The patient is currently admitted to the hospital and receiving intravenous antibiotics. The antibiotics were administered within an appropriate time frame prior to the initiation of the procedure. ANESTHESIA/SEDATION: Moderate (conscious) sedation was employed during this procedure. A total of Versed 2 mg and Fentanyl 75 mcg was administered intravenously by the radiology nurse. Total intra-service moderate Sedation Time: 12 minutes. The patient's level of consciousness and vital signs were monitored continuously by radiology nursing throughout the procedure under my direct supervision. COMPLICATIONS: None immediate. PROCEDURE: Informed written consent was obtained from the patient after a thorough discussion of the procedural risks, benefits and alternatives. All questions were addressed. Maximal Sterile Barrier Technique was utilized including caps, mask, sterile gowns, sterile gloves, sterile drape, hand hygiene and skin antiseptic. A timeout was performed prior to the initiation of the procedure. Patient position seated on the procedure table. Ultrasound evaluation of the left chest demonstrates a small loculated pleural effusion. The overlying skin was prepped and draped in usual fashion. Following local lidocaine a stray shin, the left pleural effusion was accessed with a 19 gauge Yueh needle utilizing continuous ultrasound guidance. Yueh catheter removed over 0.035 inch Amplatz guidewire. Serial dilation was performed and 12 [place] multipurpose pigtail drain was inserted over the guidewire. Drain secured to skin with suture and connected to pleura vac. IMPRESSION: 12 [place] multipurpose pigtail drain placed in loculated left pleural effusion. Electronically Signed   By: Miachel Roux M.D.   On: 07/19/2021 12:13    Labs:  CBC: Recent Labs    07/17/21 0428  07/18/21 0437 07/19/21 0127 07/20/21 0101  WBC 22.4* 14.8* 15.1* 20.9*  HGB 12.2* 13.1 13.4 15.3  HCT 37.1* 40.1 41.0 48.0  PLT 391 375 384 425*    COAGS: Recent Labs    07/11/21 2130  INR 1.0    BMP: Recent Labs    07/17/21 0428 07/18/21 0437 07/19/21 0127 07/20/21 0101  NA 139 140 136 132*  K 3.9 3.9 3.9 5.1  CL 102 101 99 98  CO2 30 31 29 24   GLUCOSE 160* 100* 119* 110*  BUN 16 18 12 17   CALCIUM 8.2* 8.1* 8.0* 8.2*  CREATININE 0.75 0.86 0.85 0.92  GFRNONAA >60 >60 >60 >60    LIVER FUNCTION TESTS: Recent Labs    07/17/21 0428 07/18/21 0437 07/19/21 0127 07/19/21 1140 07/20/21 0101  BILITOT 0.2* 0.3 0.4  --  0.9  AST 100* 81* 38  --  50*  ALT 267* 253* 179*  --  148*  ALKPHOS 173* 180* 168*  --  174*  PROT 6.2* 6.3* 6.2* 6.5 6.3*  ALBUMIN 2.2* 2.2* 2.1*  --  2.3*    Assessment and Plan:  Loculated left pleural effusion - s/p pigtail chest tube placement by Dr. Dwaine Gale on 07/19/21.  1.8 L out since fibrinolysis.  Continue care per Pulmonology.  Electronically Signed: Murrell Redden, PA-C 07/20/2021, 1:03 PM    I spent a total of 15 Minutes at the the patient's bedside AND on the patient's hospital floor or unit, greater than 50% of which was counseling/coordinating care for f/u pigtail chest tube.

## 2021-07-20 NOTE — Evaluation (Signed)
Physical Therapy Evaluation Patient Details Name: Victor [name] MRN: [id number] DOB: [birth year] Today's Date: 07/20/2021  History of Present Illness  Pt is a 45 y/o male admitted 11/16 secondary to increased SOB. Found to have acute hypoxic respiratory failure due to CAP with L. Sided infiltrate and surrounding moderate to large effusion. Pt is s/p chest tube placement on the L on 11/24. PMH includes tobacco use, HTN, and s/p R TKA with multiple revisions.  Clinical Impression  Pt admitted secondary to problem above with deficits below. Pt requiring mod A for bed mobility and min A to stand at EOB. HR elevated to mid 120s during mobility tasks. Pt reporting increased pain so further mobility deferred. Anticipate pt will progress well once pain is controlled. Will continue to follow acutely.        Recommendations for follow up therapy are one component of a multi-disciplinary discharge planning process, led by the attending physician.  Recommendations [place] be updated based on patient status, additional functional criteria and insurance authorization.  Follow Up Recommendations Home health PT (vs no PT follow up pending progression)    Assistance Recommended at Discharge Intermittent Supervision/Assistance  Functional Status Assessment Patient has had a recent decline in their functional status and demonstrates the ability to make significant improvements in function in a reasonable and predictable amount of time.  Equipment Recommendations  None recommended by PT    Recommendations for Other Services       Precautions / Restrictions Precautions Precautions: Fall Precaution Comments: chest tube Restrictions Weight Bearing Restrictions: No      Mobility  Bed Mobility Overal bed mobility: Needs Assistance Bed Mobility: Supine to Sit     Supine to sit: HOB elevated;Mod assist     General bed mobility comments: Mod A for trunk elevation and to scoot hips to EOB. Heavily reliant on  RUE as LUE limited secondary to chest tube    Transfers Overall transfer level: Needs assistance Equipment used: 1 person hand held assist Transfers: Sit to/from Stand Sit to Stand: Min assist           General transfer comment: Min A for lift assist and steadying. HR elevating to mid 120s during standing attempt.    Ambulation/Gait               General Gait Details: deferred secondary to pain  Stairs            Wheelchair Mobility    Modified Rankin (Stroke Patients Only)       Balance Overall balance assessment: Needs assistance Sitting-balance support: No upper extremity supported;Feet supported Sitting balance-Leahy Scale: Good     Standing balance support: Single extremity supported Standing balance-Leahy Scale: Poor Standing balance comment: Reliant on at least 1 UE support                             Pertinent Vitals/Pain Pain Assessment: Faces Faces Pain Scale: Hurts even more Pain Location: L chest Pain Descriptors / Indicators: Grimacing;Guarding Pain Intervention(s): Monitored during session;Limited activity within patient's tolerance;Repositioned    Home Living Family/patient expects to be discharged to:: Private residence Living Arrangements: Spouse/significant other;Children Available Help at Discharge: Family;Available 24 hours/day Type of Home: House Home Access: Stairs to enter Entrance Stairs-Rails: Right;Left;Can reach both Entrance Stairs-Number of Steps: 6   Home Layout: One level Home Equipment: Conservation officer, nature (2 wheels);BSC/3in1      Prior Function Prior Level of Function : Independent/Modified Independent  Hand Dominance        Extremity/Trunk Assessment   Upper Extremity Assessment Upper Extremity Assessment: LUE deficits/detail LUE Deficits / Details: Limited LUE ROM secondary to chest tube    Lower Extremity Assessment Lower Extremity Assessment: Overall WFL for  tasks assessed    Cervical / Trunk Assessment Cervical / Trunk Assessment: Normal  Communication   Communication: No difficulties  Cognition Arousal/Alertness: Awake/alert Behavior During Therapy: WFL for tasks assessed/performed Overall Cognitive Status: Within Functional Limits for tasks assessed                                          General Comments General comments (skin integrity, edema, etc.): Pt's wife present during session    Exercises     Assessment/Plan    PT Assessment Patient needs continued PT services  PT Problem List Decreased activity tolerance;Decreased mobility;Decreased range of motion;Pain;Cardiopulmonary status limiting activity       PT Treatment Interventions Gait training;Stair training;Functional mobility training;Therapeutic activities;Therapeutic exercise;Balance training;Patient/family education    PT Goals (Current goals can be found in the Care Plan section)  Acute Rehab PT Goals Patient Stated Goal: to decrease pain PT Goal Formulation: With patient Time For Goal Achievement: 08/03/21 Potential to Achieve Goals: Good    Frequency Min 3X/week   Barriers to discharge        Co-evaluation               AM-PAC PT "6 Clicks" Mobility  Outcome Measure Help needed turning from your back to your side while in a flat bed without using bedrails?: A Little Help needed moving from lying on your back to sitting on the side of a flat bed without using bedrails?: A Lot Help needed moving to and from a bed to a chair (including a wheelchair)?: A Little Help needed standing up from a chair using your arms (e.g., wheelchair or bedside chair)?: A Little Help needed to walk in hospital room?: A Little Help needed climbing 3-5 steps with a railing? : A Lot 6 Click Score: 16    End of Session   Activity Tolerance: Patient limited by pain Patient left: in bed;with call bell/phone within reach;with family/visitor present  (sitting EOB with family present; RN aware) Nurse Communication: Mobility status PT Visit Diagnosis: Other abnormalities of gait and mobility (R26.89);Difficulty in walking, not elsewhere classified (R26.2);Pain Pain - Right/Left: Left Pain - part of body:  (chest)    Time: 8185-6314 PT Time Calculation (min) (ACUTE ONLY): 16 min   Charges:   PT Evaluation $PT Eval Moderate Complexity: 1 Mod          Reuel Derby, PT, DPT  Acute Rehabilitation Services  Pager: [phone] Office: [phone]   Rudean Hitt 07/20/2021, 11:14 AM

## 2021-07-20 NOTE — Procedures (Signed)
Pleural Fibrinolytic Administration Procedure Note  Victor [name]  [id number]  Desantis 18, 1977  Date:07/20/21  Time:7:12 AM   Provider Performing:Chayce Robbins B Taimur Fier   Procedure: Pleural Fibrinolysis Initial day [phone])  Indication(s) Fibrinolysis of complicated pleural effusion  Consent Risks of the procedure as well as the alternatives and risks of each were explained to the patient and/or caregiver.  Consent for the procedure was obtained.   Anesthesia None   Time Out Verified patient identification, verified procedure, site/side was marked, verified correct patient position, special equipment/implants available, medications/allergies/relevant history reviewed, required imaging and test results available.   Sterile Technique Hand hygiene, gloves   Procedure Description Existing pleural catheter was cleaned and accessed in sterile manner.  10mg  of tPA in 30cc of saline and 5mg  of dornase in 30cc of sterile water were injected into pleural space using existing pleural catheter.  Catheter will be clamped for 1 hour and then placed back to suction.   Complications/Tolerance None; patient tolerated the procedure well.  EBL None   Specimen(s) None

## 2021-07-20 NOTE — Procedures (Signed)
Pleural Fibrinolytic Administration Procedure Note  Victor [name]  [id number]  1976-05-16  Date:07/20/21  Time:8:33 AM   Provider Performing:Jaana Brodt B Courtany Mcmurphy   Procedure: Pleural Fibrinolysis Subsequent day [phone]  Indication(s) Fibrinolysis of complicated pleural effusion  Consent Risks of the procedure as well as the alternatives and risks of each were explained to the patient and/or caregiver.  Consent for the procedure was obtained.   Anesthesia None   Time Out Verified patient identification, verified procedure, site/side was marked, verified correct patient position, special equipment/implants available, medications/allergies/relevant history reviewed, required imaging and test results available.   Sterile Technique Hand hygiene, gloves   Procedure Description Existing pleural catheter was cleaned and accessed in sterile manner.  10mg  of tPA in 30cc of saline and 5mg  of dornase in 30cc of sterile water were injected into pleural space using existing pleural catheter.  Catheter will be clamped for 1 hour and then placed back to suction.   Complications/Tolerance None; patient tolerated the procedure well.  EBL None   Specimen(s) None

## 2021-07-21 ENCOUNTER — Inpatient Hospital Stay (HOSPITAL_COMMUNITY): Payer: Medicaid Other

## 2021-07-21 ENCOUNTER — Other Ambulatory Visit (HOSPITAL_COMMUNITY): Payer: Medicaid Other

## 2021-07-21 DIAGNOSIS — J90 Pleural effusion, not elsewhere classified: Secondary | ICD-10-CM | POA: Diagnosis not present

## 2021-07-21 DIAGNOSIS — J9601 Acute respiratory failure with hypoxia: Secondary | ICD-10-CM

## 2021-07-21 DIAGNOSIS — J869 Pyothorax without fistula: Secondary | ICD-10-CM

## 2021-07-21 LAB — CBC WITH DIFFERENTIAL/PLATELET
Abs Immature Granulocytes: 1.28 10*3/uL — ABNORMAL HIGH (ref 0.00–0.07)
Basophils Absolute: 0.1 10*3/uL (ref 0.0–0.1)
Basophils Relative: 1 %
Eosinophils Absolute: 0.2 10*3/uL (ref 0.0–0.5)
Eosinophils Relative: 1 %
HCT: 46.2 % (ref 39.0–52.0)
Hemoglobin: 15.3 g/dL (ref 13.0–17.0)
Immature Granulocytes: 6 %
Lymphocytes Relative: 12 %
Lymphs Abs: 2.7 10*3/uL (ref 0.7–4.0)
MCH: 29.1 pg (ref 26.0–34.0)
MCHC: 33.1 g/dL (ref 30.0–36.0)
MCV: 88 fL (ref 80.0–100.0)
Monocytes Absolute: 2.2 10*3/uL — ABNORMAL HIGH (ref 0.1–1.0)
Monocytes Relative: 10 %
Neutro Abs: 15.5 10*3/uL — ABNORMAL HIGH (ref 1.7–7.7)
Neutrophils Relative %: 70 %
Platelets: 378 10*3/uL (ref 150–400)
RBC: 5.25 MIL/uL (ref 4.22–5.81)
RDW: 14.4 % (ref 11.5–15.5)
WBC: 21.9 10*3/uL — ABNORMAL HIGH (ref 4.0–10.5)
nRBC: 0 % (ref 0.0–0.2)

## 2021-07-21 LAB — ECHOCARDIOGRAM COMPLETE
Area-P 1/2: 3.48 cm2
Height: 71 in
S' Lateral: 3 cm
Weight: 3992.97 oz

## 2021-07-21 LAB — COMPREHENSIVE METABOLIC PANEL
ALT: 106 U/L — ABNORMAL HIGH (ref 0–44)
AST: 34 U/L (ref 15–41)
Albumin: 2 g/dL — ABNORMAL LOW (ref 3.5–5.0)
Alkaline Phosphatase: 164 U/L — ABNORMAL HIGH (ref 38–126)
Anion gap: 9 (ref 5–15)
BUN: 19 mg/dL (ref 6–20)
CO2: 28 mmol/L (ref 22–32)
Calcium: 8.1 mg/dL — ABNORMAL LOW (ref 8.9–10.3)
Chloride: 95 mmol/L — ABNORMAL LOW (ref 98–111)
Creatinine, Ser: 0.92 mg/dL (ref 0.61–1.24)
GFR, Estimated: >60 mL/min (ref >60)
Glucose, Bld: 118 mg/dL — ABNORMAL HIGH (ref 70–99)
Potassium: 4.2 mmol/L (ref 3.5–5.1)
Sodium: 132 mmol/L — ABNORMAL LOW (ref 135–145)
Total Bilirubin: 0.8 mg/dL (ref 0.3–1.2)
Total Protein: 6 g/dL — ABNORMAL LOW (ref 6.5–8.1)

## 2021-07-21 LAB — C-REACTIVE PROTEIN: CRP: 18.9 mg/dL — ABNORMAL HIGH (ref <1.0)

## 2021-07-21 LAB — MAGNESIUM: Magnesium: 2.1 mg/dL (ref 1.7–2.4)

## 2021-07-21 LAB — BRAIN NATRIURETIC PEPTIDE: B Natriuretic Peptide: 8.1 pg/mL (ref 0.0–100.0)

## 2021-07-21 LAB — PROCALCITONIN: Procalcitonin: 0.23 ng/mL

## 2021-07-21 MED ORDER — PANTOPRAZOLE SODIUM 40 MG PO TBEC
40.0000 mg | DELAYED_RELEASE_TABLET | Freq: Two times a day (BID) | ORAL | Status: DC
  Administered 2021-07-21 – 2021-07-28 (×15): 40 mg via ORAL
  Filled 2021-07-21 (×15): qty 1

## 2021-07-21 MED ORDER — SODIUM CHLORIDE (PF) 0.9 % IJ SOLN
10.0000 mg | Freq: Once | INTRAMUSCULAR | Status: AC
  Administered 2021-07-21: 10 mg via INTRAPLEURAL
  Filled 2021-07-21: qty 10

## 2021-07-21 MED ORDER — STERILE WATER FOR INJECTION IJ SOLN
5.0000 mg | Freq: Once | RESPIRATORY_TRACT | Status: AC
  Administered 2021-07-21: 5 mg via INTRAPLEURAL
  Filled 2021-07-21: qty 5

## 2021-07-21 MED ORDER — SODIUM CHLORIDE 0.9 % IV SOLN
1.0000 g | Freq: Three times a day (TID) | INTRAVENOUS | Status: DC
  Administered 2021-07-21 – 2021-07-28 (×22): 1 g via INTRAVENOUS
  Filled 2021-07-21 (×26): qty 1

## 2021-07-21 NOTE — Progress Notes (Signed)
NAME:  Victor [name], MRN:  [id number], DOB:  [birth year], LOS: 9 ADMISSION DATE:  07/11/2021, CONSULTATION DATE:  07/12/2021 REFERRING MD:  Dr. Carles Collet, [facility], CHIEF COMPLAINT:  Short of breath   History of Present Illness:  45 yo male smoker developed pain in his left chest on 07/06/21.  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  He then developed cough with rust colored sputum, chills, sweats, and shakes.  Appetite has decreased.  Has pleuritic pain on left side causing splinting with respiration.  In ER he had SpO2 86% on room air.  CT chest showed LLL infiltrate and small effusion.  He was started on antibiotics, supplemental oxygen and solumedrol.  PCCM asked to assist with management.  Pertinent  Medical History  GERD, HTN, Stomach ulcer  Significant Hospital Events: Including procedures, antibiotic start and stop dates in addition to other pertinent events   11/16 admit 11/17 add solumedrol for pleurisy> d/c'd 11/21  11/24 Left pigtail placement per IR, ceftriaxone changed to unasyn/ levaquin  Studies:  CT angio chest 07/11/21 >> LLL infiltrate, small Lt pleural effusion U/S chest 07/12/21 >> small, complex effusion Urinary strep 11/16 neg Urinary legionella 11/16 neg  MRSA PCR screen 11/17 neg  CT with contrast 11/23>>>moderate left pleural effusion with adjacent consolidation, slightly worse from prior CT on 11/16, new trace pericardial effusion  Scheduled Meds:  alteplase (TPA) for intrapleural administration  10 mg Intrapleural Once   And   pulmozyme (DORNASE) for intrapleural administration  5 mg Intrapleural Once   arformoterol  15 mcg Nebulization BID   budesonide (PULMICORT) nebulizer solution  0.5 mg Nebulization BID   calcium carbonate  1 tablet Oral Q breakfast   carvedilol  6.25 mg Oral BID WC   Chlorhexidine Gluconate Cloth  6 each Topical Daily   dextromethorphan-guaiFENesin  1 tablet Oral BID   docusate sodium  100 mg Oral  BID   enoxaparin (LOVENOX) injection  60 mg Subcutaneous Daily   ipratropium  0.5 mg Nebulization BID   levalbuterol  0.63 mg Nebulization BID   levofloxacin  500 mg Oral Daily   lidocaine  1 patch Transdermal Q24H   nicotine  21 mg Transdermal Daily   sodium chloride flush  10 mL Other Q8H   Continuous Infusions:   PRN Meds:.acetaminophen **OR** acetaminophen, acetaminophen, HYDROmorphone (DILAUDID) injection, levalbuterol, methocarbamol, oxyCODONE, sodium chloride   Interim History / Subjective:  No acute events overnight.   2L of output from chest tube since placement and 2 doses of TPA/DNAse  Chest radiograph without much improvement in left pleural effusion.  Patient reports breathing a little better.   Objective   Blood pressure 126/83, pulse 84, temperature 97.8 F (36.6 C), temperature source Oral, resp. rate 16, height 5\' 11"  (1.803 m), weight 113.2 kg, SpO2 95 %.        Intake/Output Summary (Last 24 hours) at 07/21/2021 0810 Last data filed at 07/21/2021 0500 Gross per 24 hour  Intake 20 ml  Output 2150 ml  Net -2130 ml   Filed Weights   07/11/21 2058 07/12/21 0515  Weight: 113.9 kg 113.2 kg   Examination: General:  no acute distress, resting in bed HEENT: MM pink/moist Neuro:  Aox3, MAE CV: rrr, no murmurs PULM:  diminished left breath sounds. No wheezing. Left chest tube to -76mmHg.  GI: obese, soft, bs+ Extremities: warm/dry, no LE edema   Resolved Hospital Problem list     Assessment & Plan:  Bacterial community acquired pneumonia with lobar consolidation of LLL. Complicated Left Pleural effusion  - s/p left pigtail ct placement 11/24  - Pleural studies concerning for exudative effusion and CXR post CT placement shows loculation so TPA/Dnase therapy initiated 11/24 - Second dose of TPA/DNAse given 11/25 - Has been on unazyn and levaquin with rising CRP and WBC count. Will change to meropenem today. - Will give third dose of TPA/DNAse this  morning. Can extended pleural lytic treatment to 6 total doses if needed. - Recommend CT surgery for possible VATs decortication  Acute hypoxic respiratory failure secondary to CAP/ atx from effusion, improving  - continue aggressive pulm hygiene, mobilize, IS - wean O2 for gaol SpO2 > 92% - continue xopenex, pulmicort/ brovana nebs  Pulmonary will continue to follow  Labs    CMP Latest Ref Rng & Units 07/21/2021 07/20/2021 07/19/2021  Glucose 70 - 99 mg/dL 118(H) 110(H) -  BUN 6 - 20 mg/dL 19 17 -  Creatinine 0.61 - 1.24 mg/dL 0.92 0.92 -  Sodium 135 - 145 mmol/L 132(L) 132(L) -  Potassium 3.5 - 5.1 mmol/L 4.2 5.1 -  Chloride 98 - 111 mmol/L 95(L) 98 -  CO2 22 - 32 mmol/L 28 24 -  Calcium 8.9 - 10.3 mg/dL 8.1(L) 8.2(L) -  Total Protein 6.5 - 8.1 g/dL 6.0(L) 6.3(L) 6.5  Total Bilirubin 0.3 - 1.2 mg/dL 0.8 0.9 -  Alkaline Phos 38 - 126 U/L 164(H) 174(H) -  AST 15 - 41 U/L 34 50(H) -  ALT 0 - 44 U/L 106(H) 148(H) -    CBC Latest Ref Rng & Units 07/21/2021 07/20/2021 07/19/2021  WBC 4.0 - 10.5 K/uL 21.9(H) 20.9(H) 15.1(H)  Hemoglobin 13.0 - 17.0 g/dL 15.3 15.3 13.4  Hematocrit 39.0 - 52.0 % 46.2 48.0 41.0  Platelets 150 - 400 K/uL 378 425(H) Castalia, MD [facility] Office: [phone]   See Amion for personal pager PCCM on call pager [phone] until 7pm. Please call Elink 7p-7a. [phone]

## 2021-07-21 NOTE — Progress Notes (Signed)
Echocardiogram 2D Echocardiogram has been performed.  [name] F 07/21/2021, 5:34 PM

## 2021-07-21 NOTE — Consult Note (Signed)
[address]       [place] [address]             [phone]      Cardiothoracic Surgery Consultation  Reason for Consult:Left empyema Referring Physician: Lala Lund, MD  Victor [name] is an 45 y.o. male.  HPI:   The patient is a 45 year old gentleman with a history of hypertension, GERD, active 1 and 1/2 pack/day smoking, and chronic knee pain status post 6 surgeries on his right knee for knee replacement complicated by infection requiring multiple revisions on chronic narcotics through a pain management clinic.  He presented to [facility] with a 1 week history of cough and shortness of breath with left-sided pleuritic chest pain.  He was diagnosed with pneumonia and admitted on 07/11/2021.  A CTA of the chest showed marked left lower lobe and left upper lobe atelectasis and/or infiltrate.  There is a small to moderate size left pleural effusion.  There is no evidence of pulmonary embolism.  An ultrasound the chest was performed 07/12/2021 showing a small complex appearing left pleural effusion.  A CT of the abdomen pelvis on 07/12/2021 showed progressive dense left lower lobe airspace opacification consistent with pneumonia as well as an enlarging moderate-sized left parapneumonic effusion.  He was treated with antibiotics and had a repeat CT scan of the chest on 07/18/2021 which showed a moderate size left pleural effusion with adjacent consolidation suggesting pneumonia.  There is a new trace pericardial effusion.  He was transferred to [facility].  He had a percutaneous drain placed by interventional radiology on 07/19/2021.  Despite this he is continue to have a moderate size left pleural effusion and left lower lobe consolidation on chest x-ray.  His white blood cell count has continued to rise to 22,000 today.  Procalcitonin is 0.23.  CRP has increased to 18.9.  He continues to have left-sided chest discomfort and some shortness of breath.  He is married  and lives with his wife who is here with him today.  Past Medical History:  Diagnosis Date   GERD (gastroesophageal reflux disease)    History of stomach ulcers    Hypertension     Past Surgical History:  Procedure Laterality Date   ANTERIOR CRUCIATE LIGAMENT REPAIR Right 05/19/2018   Procedure: RIGHT KNEE ARTHROSCOPY WITH ANTERIOR CRUCIATE LIGAMENT (ACL) REPAIR and MEDIAL MENISECTOMY;  Surgeon: [name], MD;  Location: [facility];  Service: Orthopedics;  Laterality: Right;   APPENDECTOMY     HARDWARE REMOVAL Right 09/21/2019   Procedure: HARDWARE REMOVAL (RICHARD'S STAPLE) RIGHT KNEE;  Surgeon: [name], MD;  Location: [facility];  Service: Orthopedics;  Laterality: Right;   IR PERC PLEURAL DRAIN W/INDWELL CATH W/IMG GUIDE  07/19/2021   KNEE ARTHROSCOPY WITH MEDIAL MENISECTOMY Right 05/29/2017   Procedure: KNEE ARTHROSCOPY WITH MEDIAL MENISECTOMY and lateral meniscectomy;  Surgeon: [name], MD;  Location: [facility];  Service: Orthopedics;  Laterality: Right;   KNEE ARTHROSCOPY WITH MEDIAL MENISECTOMY Right 02/05/2018   Procedure: RIGHT KNEE ARTHROSCOPY WITH PARTIAL MEDIAL AND LATERAL MENISECTOMY;  Surgeon: [name], MD;  Location: [facility];  Service: Orthopedics;  Laterality: Right;   TOTAL KNEE ARTHROPLASTY Right 03/28/2020   Procedure: TOTAL KNEE ARTHROPLASTY;  Surgeon: [name], MD;  Location: [facility];  Service: Orthopedics;  Laterality: Right;   TOTAL KNEE REVISION Right 08/08/2020   Procedure: REVISION OF RIGHT TOTAL KNEE  TIBIAL COMPONENTS ONLY;  Surgeon: Aline Brochure,  Tim Lair, MD;  Location: [facility];  Service: Orthopedics;  Laterality: Right;    Family History  Problem Relation Age of Onset   Diabetes Mother    Heart failure Mother    Cancer Mother    Pancreatic cancer Mother    Cancer Father    Throat cancer Father    Healthy Sister    Head & neck cancer Brother    Bone cancer Maternal Grandmother    Diabetes Maternal Grandmother    CAD  Maternal Grandfather    Brain cancer Maternal Grandfather    Colon cancer Paternal Grandfather     Social History:  reports that he has been smoking cigarettes. He started smoking about 30 years ago. He has a 7.50 pack-year smoking history. He quit smokeless tobacco use about 3 years ago.  His smokeless tobacco use included chew. He reports that he does not drink alcohol and does not use drugs.  Allergies: No Known Allergies  Medications: I have reviewed the patient's current medications. Prior to Admission:  Medications Prior to Admission  Medication Sig Dispense Refill Last Dose   amitriptyline (ELAVIL) 10 MG tablet Take 10 mg by mouth at bedtime.   Past Week   amLODipine (NORVASC) 10 MG tablet Take 1 tablet (10 mg total) by mouth daily. 90 tablet 3 07/11/2021   aspirin EC 325 MG EC tablet Take 1 tablet (325 mg total) by mouth daily with breakfast. 30 tablet 0 07/11/2021   cholecalciferol (VITAMIN D3) 25 MCG (1000 UNIT) tablet Take 1,000 Units by mouth daily.   07/11/2021   docusate sodium (COLACE) 100 MG capsule Take 100 mg by mouth daily.   07/11/2021   methocarbamol (ROBAXIN) 500 MG tablet Take 1 tablet (500 mg total) by mouth every 6 (six) hours as needed for muscle spasms. 30 tablet 0 07/11/2021   oxyCODONE-acetaminophen (PERCOCET) 7.5-325 MG tablet Take 1 tablet by mouth every 6 (six) hours as needed for severe pain.   07/11/2021   [EXPIRED] promethazine-dextromethorphan (PROMETHAZINE-DM) 6.25-15 MG/5ML syrup Take by mouth.      traZODone (DESYREL) 50 MG tablet Take 50 mg by mouth at bedtime.      naloxone (NARCAN) nasal spray 4 mg/0.1 mL       Scheduled:  arformoterol  15 mcg Nebulization BID   budesonide (PULMICORT) nebulizer solution  0.5 mg Nebulization BID   calcium carbonate  1 tablet Oral Q breakfast   carvedilol  6.25 mg Oral BID WC   Chlorhexidine Gluconate Cloth  6 each Topical Daily   dextromethorphan-guaiFENesin  1 tablet Oral BID   docusate sodium  100 mg Oral BID    enoxaparin (LOVENOX) injection  60 mg Subcutaneous Daily   ipratropium  0.5 mg Nebulization BID   levalbuterol  0.63 mg Nebulization BID   lidocaine  1 patch Transdermal Q24H   nicotine  21 mg Transdermal Daily   pantoprazole  40 mg Oral BID   sodium chloride flush  10 mL Other Q8H   Continuous:  meropenem (MERREM) IV 1 g (07/21/21 1213)   LOV:FIEPPIRJJOACZ **OR** acetaminophen, acetaminophen, HYDROmorphone (DILAUDID) injection, levalbuterol, methocarbamol, oxyCODONE, sodium chloride Anti-infectives (From admission, onward)    Start     Dose/Rate Route Frequency Ordered Stop   07/21/21 0915  meropenem (MERREM) 1 g in sodium chloride 0.9 % 100 mL IVPB        1 g 200 mL/hr over 30 Minutes Intravenous Every 8 hours 07/21/21 0815     07/19/21 1030  Ampicillin-Sulbactam (UNASYN) 3 g  in sodium chloride 0.9 % 100 mL IVPB  Status:  Discontinued        3 g 200 mL/hr over 30 Minutes Intravenous Every 8 hours 07/19/21 0940 07/21/21 0801   07/19/21 1030  levofloxacin (LEVAQUIN) tablet 500 mg  Status:  Discontinued        500 mg Oral Daily 07/19/21 0940 07/21/21 1407   07/17/21 2000  cefTRIAXone (ROCEPHIN) 2 g in sodium chloride 0.9 % 100 mL IVPB  Status:  Discontinued        2 g 200 mL/hr over 30 Minutes Intravenous Every 24 hours 07/17/21 0948 07/19/21 0940   07/12/21 2200  cefTRIAXone (ROCEPHIN) 2 g in sodium chloride 0.9 % 100 mL IVPB        2 g 200 mL/hr over 30 Minutes Intravenous Every 24 hours 07/12/21 0442 07/16/21 2147   07/12/21 2200  azithromycin (ZITHROMAX) 500 mg in sodium chloride 0.9 % 250 mL IVPB        500 mg 250 mL/hr over 60 Minutes Intravenous Every 24 hours 07/12/21 0442 07/15/21 2358   07/12/21 0530  cefTRIAXone (ROCEPHIN) 1 g in sodium chloride 0.9 % 100 mL IVPB  Status:  Discontinued        1 g 200 mL/hr over 30 Minutes Intravenous Every 24 hours 07/12/21 0442 07/12/21 0445   07/11/21 2230  cefTRIAXone (ROCEPHIN) 1 g in sodium chloride 0.9 % 100 mL IVPB        1  g 200 mL/hr over 30 Minutes Intravenous  Once 07/11/21 2223 07/11/21 2341   07/11/21 2230  azithromycin (ZITHROMAX) 500 mg in sodium chloride 0.9 % 250 mL IVPB        500 mg 250 mL/hr over 60 Minutes Intravenous  Once 07/11/21 2223 07/11/21 2356       Results for orders placed or performed during the hospital encounter of 07/11/21 (from the past 48 hour(s))  Procalcitonin     Status: None   Collection Time: 07/20/21  1:01 AM  Result Value Ref Range   Procalcitonin 0.31 ng/mL    Comment:        Interpretation: PCT (Procalcitonin) <= 0.5 ng/mL: Systemic infection (sepsis) is not likely. Local bacterial infection is possible. (NOTE)       Sepsis PCT Algorithm           Lower Respiratory Tract                                      Infection PCT Algorithm    ----------------------------     ----------------------------         PCT < 0.25 ng/mL                PCT < 0.10 ng/mL          Strongly encourage             Strongly discourage   discontinuation of antibiotics    initiation of antibiotics    ----------------------------     -----------------------------       PCT 0.25 - 0.50 ng/mL            PCT 0.10 - 0.25 ng/mL               OR       >80% decrease in PCT            Discourage initiation of  antibiotics      Encourage discontinuation           of antibiotics    ----------------------------     -----------------------------         PCT >= 0.50 ng/mL              PCT 0.26 - 0.50 ng/mL               AND        <80% decrease in PCT             Encourage initiation of                                             antibiotics       Encourage continuation           of antibiotics    ----------------------------     -----------------------------        PCT >= 0.50 ng/mL                  PCT > 0.50 ng/mL               AND         increase in PCT                  Strongly encourage                                      initiation of  antibiotics    Strongly encourage escalation           of antibiotics                                     -----------------------------                                           PCT <= 0.25 ng/mL                                                 OR                                        > 80% decrease in PCT                                      Discontinue / Do not initiate                                             antibiotics  Performed at [facility], [place] [address], [place], [place] [address]   Magnesium     Status: None   Collection Time:  07/20/21  1:01 AM  Result Value Ref Range   Magnesium 2.3 1.7 - 2.4 mg/dL    Comment: Performed at [facility] [address] [place], [place] [address]  C-reactive protein     Status: Abnormal   Collection Time: 07/20/21  1:01 AM  Result Value Ref Range   CRP 13.1 (H) <1.0 mg/dL    Comment: Performed at [facility] [address], [place], [place] [address]  Comprehensive metabolic panel     Status: Abnormal   Collection Time: 07/20/21  1:01 AM  Result Value Ref Range   Sodium 132 (L) 135 - 145 mmol/L   Potassium 5.1 3.5 - 5.1 mmol/L    Comment: DELTA CHECK NOTED   Chloride 98 98 - 111 mmol/L   CO2 24 22 - 32 mmol/L   Glucose, Bld 110 (H) 70 - 99 mg/dL    Comment: Glucose reference range applies only to samples taken after fasting for at least 8 hours.   BUN 17 6 - 20 mg/dL   Creatinine, Ser 0.92 0.61 - 1.24 mg/dL   Calcium 8.2 (L) 8.9 - 10.3 mg/dL   Total Protein 6.3 (L) 6.5 - 8.1 g/dL   Albumin 2.3 (L) 3.5 - 5.0 g/dL   AST 50 (H) 15 - 41 U/L   ALT 148 (H) 0 - 44 U/L   Alkaline Phosphatase 174 (H) 38 - 126 U/L   Total Bilirubin 0.9 0.3 - 1.2 mg/dL   GFR, Estimated >60 >60 mL/min    Comment: (NOTE) Calculated using the CKD-EPI Creatinine Equation (2021)    Anion gap 10 5 - 15    Comment: Performed at [facility], [place] [address] [place], [place] [address]  CBC with Differential/Platelet     Status:  Abnormal   Collection Time: 07/20/21  1:01 AM  Result Value Ref Range   WBC 20.9 (H) 4.0 - 10.5 K/uL   RBC 5.35 4.22 - 5.81 MIL/uL   Hemoglobin 15.3 13.0 - 17.0 g/dL   HCT 48.0 39.0 - 52.0 %   MCV 89.7 80.0 - 100.0 fL   MCH 28.6 26.0 - 34.0 pg   MCHC 31.9 30.0 - 36.0 g/dL   RDW 14.6 11.5 - 15.5 %   Platelets 425 (H) 150 - 400 K/uL   nRBC 0.0 0.0 - 0.2 %   Neutrophils Relative % 73 %   Neutro Abs 15.7 (H) 1.7 - 7.7 K/uL   Band Neutrophils 2 %   Lymphocytes Relative 14 %   Lymphs Abs 2.9 0.7 - 4.0 K/uL   Monocytes Relative 6 %   Monocytes Absolute 1.3 (H) 0.1 - 1.0 K/uL   Eosinophils Relative 1 %   Eosinophils Absolute 0.2 0.0 - 0.5 K/uL   Basophils Relative 0 %   Basophils Absolute 0.0 0.0 - 0.1 K/uL   nRBC 0 0 /100 WBC   Myelocytes 4 %   Abs Immature Granulocytes 0.80 (H) 0.00 - 0.07 K/uL    Comment: Performed at [facility], [address], [place], [place] [address]  Brain natriuretic peptide     Status: None   Collection Time: 07/20/21  1:01 AM  Result Value Ref Range   B Natriuretic Peptide 4.0 0.0 - 100.0 pg/mL    Comment: Performed at [facility] [address], [place],  [address]  Procalcitonin     Status: None   Collection Time: 07/21/21  1:06 AM  Result Value Ref Range  Procalcitonin 0.23 ng/mL    Comment:        Interpretation: PCT (Procalcitonin) <= 0.5 ng/mL: Systemic infection (sepsis) is not likely. Local bacterial infection is possible. (NOTE)       Sepsis PCT Algorithm           Lower Respiratory Tract                                      Infection PCT Algorithm    ----------------------------     ----------------------------         PCT < 0.25 ng/mL                PCT < 0.10 ng/mL          Strongly encourage             Strongly discourage   discontinuation of antibiotics    initiation of antibiotics    ----------------------------     -----------------------------       PCT 0.25 - 0.50 ng/mL            PCT 0.10 - 0.25  ng/mL               OR       >80% decrease in PCT            Discourage initiation of                                            antibiotics      Encourage discontinuation           of antibiotics    ----------------------------     -----------------------------         PCT >= 0.50 ng/mL              PCT 0.26 - 0.50 ng/mL               AND        <80% decrease in PCT             Encourage initiation of                                             antibiotics       Encourage continuation           of antibiotics    ----------------------------     -----------------------------        PCT >= 0.50 ng/mL                  PCT > 0.50 ng/mL               AND         increase in PCT                  Strongly encourage                                      initiation of antibiotics    Strongly encourage escalation           of antibiotics                                     -----------------------------  PCT <= 0.25 ng/mL                                                 OR                                        > 80% decrease in PCT                                      Discontinue / Do not initiate                                             antibiotics  Performed at [facility], [place] [address], [place], [place] [address]   Magnesium     Status: None   Collection Time: 07/21/21  1:06 AM  Result Value Ref Range   Magnesium 2.1 1.7 - 2.4 mg/dL    Comment: Performed at [facility] [address] [place], [place] [address]  C-reactive protein     Status: Abnormal   Collection Time: 07/21/21  1:06 AM  Result Value Ref Range   CRP 18.9 (H) <1.0 mg/dL    Comment: Performed at [facility] [address], [place], [place] [address]  Comprehensive metabolic panel     Status: Abnormal   Collection Time: 07/21/21  1:06 AM  Result Value Ref Range   Sodium 132 (L) 135 - 145 mmol/L   Potassium 4.2 3.5 - 5.1 mmol/L    Comment: DELTA  CHECK NOTED   Chloride 95 (L) 98 - 111 mmol/L   CO2 28 22 - 32 mmol/L   Glucose, Bld 118 (H) 70 - 99 mg/dL    Comment: Glucose reference range applies only to samples taken after fasting for at least 8 hours.   BUN 19 6 - 20 mg/dL   Creatinine, Ser 0.92 0.61 - 1.24 mg/dL   Calcium 8.1 (L) 8.9 - 10.3 mg/dL   Total Protein 6.0 (L) 6.5 - 8.1 g/dL   Albumin 2.0 (L) 3.5 - 5.0 g/dL   AST 34 15 - 41 U/L   ALT 106 (H) 0 - 44 U/L   Alkaline Phosphatase 164 (H) 38 - 126 U/L   Total Bilirubin 0.8 0.3 - 1.2 mg/dL   GFR, Estimated >60 >60 mL/min    Comment: (NOTE) Calculated using the CKD-EPI Creatinine Equation (2021)    Anion gap 9 5 - 15    Comment: Performed at [facility], [place] [address], [place], [place] [address]  CBC with Differential/Platelet     Status: Abnormal   Collection Time: 07/21/21  1:06 AM  Result Value Ref Range   WBC 21.9 (H) 4.0 - 10.5 K/uL   RBC 5.25 4.22 - 5.81 MIL/uL   Hemoglobin 15.3 13.0 - 17.0 g/dL   HCT 46.2 39.0 - 52.0 %   MCV 88.0 80.0 - 100.0 fL   MCH 29.1 26.0 - 34.0 pg   MCHC 33.1 30.0 - 36.0 g/dL   RDW 14.4 11.5 - 15.5 %  Platelets 378 150 - 400 K/uL   nRBC 0.0 0.0 - 0.2 %   Neutrophils Relative % 70 %   Neutro Abs 15.5 (H) 1.7 - 7.7 K/uL   Lymphocytes Relative 12 %   Lymphs Abs 2.7 0.7 - 4.0 K/uL   Monocytes Relative 10 %   Monocytes Absolute 2.2 (H) 0.1 - 1.0 K/uL   Eosinophils Relative 1 %   Eosinophils Absolute 0.2 0.0 - 0.5 K/uL   Basophils Relative 1 %   Basophils Absolute 0.1 0.0 - 0.1 K/uL   WBC Morphology MORPHOLOGY UNREMARKABLE    RBC Morphology MORPHOLOGY UNREMARKABLE    Smear Review MORPHOLOGY UNREMARKABLE    Immature Granulocytes 6 %   Abs Immature Granulocytes 1.28 (H) 0.00 - 0.07 K/uL    Comment: Performed at [facility] [address] [place], [place] [address]  Brain natriuretic peptide     Status: None   Collection Time: 07/21/21  1:06 AM  Result Value Ref Range   B Natriuretic Peptide 8.1 0.0 - 100.0 pg/mL     Comment: Performed at [facility] [address] [place], [place] [address]    DG CHEST PORT 1 VIEW  Result Date: 07/21/2021 CLINICAL DATA:  Chest pain.  Follow-up left pleural effusion. EXAM: PORTABLE CHEST 1 VIEW COMPARISON:  07/20/2021 FINDINGS: Left pleural pigtail catheter remains in place. A moderate left pleural effusion is unchanged in size. No pneumothorax visualized. Left lower lobe atelectasis is also stable. Right lung remains grossly clear. Heart size remains within normal limits. IMPRESSION: Stable moderate left pleural effusion and left lower lobe atelectasis. Electronically Signed   By: Marlaine Hind M.D.   On: 07/21/2021 08:46   DG CHEST PORT 1 VIEW  Result Date: 07/20/2021 CLINICAL DATA:  Chest tube placement EXAM: PORTABLE CHEST 1 VIEW COMPARISON:  Chest radiograph 07/19/2021 FINDINGS: The left sided pigtail catheter projects over the left base, unchanged. The cardiomediastinal silhouette is grossly stable. The moderate size left pleural effusion layering along the left chest wall is similar in size to the study from 1 day prior, though aeration of the left lung appears slightly improved. There is no pneumothorax. The right lung is clear. There is no right pleural effusion or pneumothorax. There is no acute osseous abnormality. IMPRESSION: Left chest tube in place with similar size of the left pleural effusion layering along the chest wall but slightly improved aeration of the left lung. No pneumothorax. Electronically Signed   By: Valetta Mole M.D.   On: 07/20/2021 07:27    Review of Systems  Constitutional:  Positive for activity change and fatigue. Negative for chills and fever.  HENT:         Poor dentition  Eyes: Negative.   Respiratory:  Positive for cough, chest tightness and shortness of breath.   Cardiovascular:  Positive for chest pain.  Gastrointestinal: Negative.   Genitourinary: Negative.   Musculoskeletal:        Chronic right knee pain  Skin:  Negative.   Neurological:  Negative for dizziness, syncope and headaches.  Hematological: Negative.   Psychiatric/Behavioral: Negative.    Blood pressure 125/78, pulse 92, temperature 98 F (36.7 C), temperature source Oral, resp. rate 20, height 5\' 11"  (1.803 m), weight 113.2 kg, SpO2 90 %. Physical Exam Constitutional:      Appearance: He is well-developed.  HENT:     Head: Normocephalic and atraumatic.  Eyes:     Extraocular Movements: Extraocular movements intact.     Pupils:  Pupils are equal, round, and reactive to light.  Cardiovascular:     Rate and Rhythm: Normal rate and regular rhythm.     Pulses: Normal pulses.     Heart sounds: Normal heart sounds. No murmur heard. Pulmonary:     Effort: Pulmonary effort is normal.     Comments: Decreased breath sounds over the left lower lobe Abdominal:     General: Abdomen is flat. Bowel sounds are normal. There is no distension.     Palpations: Abdomen is soft.     Tenderness: There is no abdominal tenderness.  Musculoskeletal:        General: No swelling.     Cervical back: Normal range of motion and neck supple.  Skin:    General: Skin is warm and dry.  Neurological:     General: No focal deficit present.     Mental Status: He is alert and oriented to person, place, and time.  Psychiatric:        Mood and Affect: Mood normal.        Behavior: Behavior normal.    Assessment/Plan:  This 45 year old gentleman has left lower lobe pneumonia with a parapneumonic effusion that is most likely loculated empyema.  Despite antibiotics and percutaneous left lower drainage the effusion appears to be increasing and he still has an increasing leukocytosis and CRP level.  This is all consistent with progressive empyema.  I think it would be best to repeat his CT scan to see what things look like and he will most likely require a small left thoracotomy for complete drainage of this loculated empyema and reexpansion of his lung.  I discussed  all this with him and his wife and they are in agreement.  I will order the CT scan and tentatively plan surgery on Monday.  Gaye Pollack 07/21/2021, 4:40 PM

## 2021-07-21 NOTE — Progress Notes (Signed)
PROGRESS NOTE                                                                                                                                                                                                             Patient Demographics:    Victor [name], is a 45 y.o. male, DOB - 02-08-1976, QIW:[id number]  Outpatient Primary MD for the patient is [name], DO    LOS - 9  Admit date - 07/11/2021    Chief Complaint  Patient presents with   Shortness of Breath       Brief Narrative (HPI from H&P)   45 year old male with a history of hypertension, hyperlipidemia, tobacco abuse, GERD, chronic knee pain status post right TKA and multiple revisions, on home narcotics and as needed Narcan, he presented to the [facility] with 1 week history of  cough shortness of breath along with left-sided pleuritic chest pain, he was diagnosed with pneumonia and admitted to the hospital on 07/11/2021, by that time he had symptoms for 1 week already.  Despite appropriate treatment he continued to have left therapeutic chest pain CT scan demonstrated moderate to large left-sided pleural effusion around the infiltrate suspicious for parapneumonic effusion versus empyema he was transferred to [facility] for further care.   Subjective:   Patient in bed in mild to moderate distress with left-sided chest tube site discomfort, mild shortness of breath, no fever chills or headache.  No new weakness.   Assessment  & Plan :    1. Acute Hypoxic Resp. Failure due to CAP with L. Sided infiltrate and surrounding moderate to large effusion - no history of travel or sick contact exposure, he does take some narcotics at home for chronic knee pain, question if he at some point had some microaspiration which she does not recall, has been treated with empiric IV antibiotics for a week without much improvement. He underwent left pigtail catheter  placement by IR on 07/19/2021, follow culture results, fluid appears parapneumonic versus empyema, tPA administered via pigtail by pulmonary on 07/20/2021, repeated on 07/21/2021, unfortunately with appropriate care his CRP and leukocytosis are still climbing, case discussed with pulmonary, he has been placed currently on combination of Levaquin and meropenem on 07/21/2021.  We will also request cardiothoracic surgery to evaluate.   2.  Multiple right knee surgeries  with chronic pain.  Supportive care.  On narcotics at home.  3.  Hypertension.  Placed on Coreg will monitor.  4.  Smoking.  Counseled to quit smoking has 30-pack-year history.  5.  Incidental finding of fatty liver with obesity.  BMI of 34.  Follow with PCP for weight loss outpatient GI follow-up to be arranged by PCP.  6.  Trace pericardial effusion noted on CT scan.  Check echocardiogram which is still pending.  Symptom-free right now.      Condition - fair  Family Communication  :  Family bedside on 07/19/2021, 07/20/2021, 07/22/2019  Code Status :  Full  Consults  :  PCCM, cardiothoracic surgery  PUD Prophylaxis :    Procedures  :     CT 07/18/21 - 1. Moderate size left pleural effusion with adjacent consolidation concerning for pneumonia, slightly worsened since the prior CT chest from 07/11/2021. Recommend continued imaging follow-up to resolution. 2. New trace pericardial effusion.  Right upper quadrant ultrasound.  Gallstones and fatty liver  L. Lung Pigtail catheter placed by IR 07/19/21  Echocardiogram.      Disposition Plan  :    Status is: Inpatient  Remains inpatient appropriate because: Evaluation of left parapneumonic effusion/empyema [place] require pigtail catheter placement.  DVT Prophylaxis  :  Lovenox  Place TED hose Start: 07/17/21 1214 SCDs Start: 07/12/21 0443    Lab Results  Component Value Date   PLT 378 07/21/2021    Diet :  Diet Order             Diet Heart Room service  appropriate? Yes; Fluid consistency: Thin  Diet effective now                    Inpatient Medications  Scheduled Meds:  arformoterol  15 mcg Nebulization BID   budesonide (PULMICORT) nebulizer solution  0.5 mg Nebulization BID   calcium carbonate  1 tablet Oral Q breakfast   carvedilol  6.25 mg Oral BID WC   Chlorhexidine Gluconate Cloth  6 each Topical Daily   dextromethorphan-guaiFENesin  1 tablet Oral BID   docusate sodium  100 mg Oral BID   enoxaparin (LOVENOX) injection  60 mg Subcutaneous Daily   ipratropium  0.5 mg Nebulization BID   levalbuterol  0.63 mg Nebulization BID   levofloxacin  500 mg Oral Daily   lidocaine  1 patch Transdermal Q24H   nicotine  21 mg Transdermal Daily   sodium chloride flush  10 mL Other Q8H   Continuous Infusions:  meropenem (MERREM) IV     PRN Meds:.acetaminophen **OR** acetaminophen, acetaminophen, HYDROmorphone (DILAUDID) injection, levalbuterol, methocarbamol, oxyCODONE, sodium chloride  Time Spent in minutes  30   Lala Lund M.D on 07/21/2021 at 11:00 AM  To page go to [email or web address]   [facility] -  Office  [phone]  See all Orders from today for further details    Objective:   Vitals:   07/20/21 2358 07/21/21 0338 07/21/21 0741 07/21/21 0755  BP: 126/84 127/88  126/83  Pulse: 94 89  84  Resp: 18 15  16   Temp: 98.1 F (36.7 C) 98.2 F (36.8 C)  97.8 F (36.6 C)  TempSrc: Oral Oral  Oral  SpO2: 92% 93% 95%   Weight:      Height:        Wt Readings from Last 3 Encounters:  07/12/21 113.2 kg  07/02/21 113.9 kg  03/29/21 113 kg     Intake/Output Summary (Last 24  hours) at 07/21/2021 1100 Last data filed at 07/21/2021 0500 Gross per 24 hour  Intake 20 ml  Output 1550 ml  Net -1530 ml     Physical Exam  Awake Alert, No new F.N deficits, Normal affect .AT,PERRAL Supple Neck, No JVD,   Symmetrical Chest wall movement, reduced left basilar breath sounds with left pigtail catheter in  place RRR,No Gallops, Rubs or new Murmurs,  +ve B.Sounds, Abd Soft, No tenderness,   No Cyanosis, Clubbing or edema         Data Review:    CBC Recent Labs  Lab 07/17/21 0428 07/18/21 0437 07/19/21 0127 07/20/21 0101 07/21/21 0106  WBC 22.4* 14.8* 15.1* 20.9* 21.9*  HGB 12.2* 13.1 13.4 15.3 15.3  HCT 37.1* 40.1 41.0 48.0 46.2  PLT 391 375 384 425* 378  MCV 90.5 91.1 88.7 89.7 88.0  MCH 29.8 29.8 29.0 28.6 29.1  MCHC 32.9 32.7 32.7 31.9 33.1  RDW 14.4 14.5 14.6 14.6 14.4  LYMPHSABS  --   --   --  2.9 2.7  MONOABS  --   --   --  1.3* 2.2*  EOSABS  --   --   --  0.2 0.2  BASOSABS  --   --   --  0.0 0.1    Electrolytes Recent Labs  Lab 07/17/21 0428 07/18/21 0437 07/19/21 0127 07/19/21 0746 07/20/21 0101 07/21/21 0106  NA 139 140 136  --  132* 132*  K 3.9 3.9 3.9  --  5.1 4.2  CL 102 101 99  --  98 95*  CO2 30 31 29   --  24 28  GLUCOSE 160* 100* 119*  --  110* 118*  BUN 16 18 12   --  17 19  CREATININE 0.75 0.86 0.85  --  0.92 0.92  CALCIUM 8.2* 8.1* 8.0*  --  8.2* 8.1*  AST 100* 81* 38  --  50* 34  ALT 267* 253* 179*  --  148* 106*  ALKPHOS 173* 180* 168*  --  174* 164*  BILITOT 0.2* 0.3 0.4  --  0.9 0.8  ALBUMIN 2.2* 2.2* 2.1*  --  2.3* 2.0*  MG  --   --  2.3  --  2.3 2.1  CRP  --   --   --  7.7* 13.1* 18.9*  PROCALCITON  --   --   --  0.12 0.31 0.23  BNP 107.0*  --   --  6.0 4.0 8.1    ------------------------------------------------------------------------------------------------------------------ No results for input(s): CHOL, HDL, LDLCALC, TRIG, CHOLHDL, LDLDIRECT in the last 72 hours.  Lab Results  Component Value Date   HGBA1C 5.4 03/03/2018    No results for input(s): TSH, T4TOTAL, T3FREE, THYROIDAB in the last 72 hours.  Invalid input(s): FREET3 ------------------------------------------------------------------------------------------------------------------ ID Labs Recent Labs  Lab 07/17/21 0428 07/18/21 0437 07/19/21 0127  07/19/21 0746 07/20/21 0101 07/21/21 0106  WBC 22.4* 14.8* 15.1*  --  20.9* 21.9*  PLT 391 375 384  --  425* 378  CRP  --   --   --  7.7* 13.1* 18.9*  PROCALCITON  --   --   --  0.12 0.31 0.23  CREATININE 0.75 0.86 0.85  --  0.92 0.92   Cardiac Enzymes No results for input(s): CKMB, TROPONINI, MYOGLOBIN in the last 168 hours.  Invalid input(s): CK    Radiology Reports CT CHEST W CONTRAST  Result Date: 07/18/2021 CLINICAL DATA:  Left-sided chest pain, shortness of breath, productive cough EXAM: CT CHEST  WITH CONTRAST TECHNIQUE: Multidetector CT imaging of the chest was performed during intravenous contrast administration. CONTRAST:  61mL OMNIPAQUE IOHEXOL 300 MG/ML  SOLN COMPARISON:  Chest radiograph most recently dated the same day, CT a chest 07/11/2021 FINDINGS: Cardiovascular: The heart is not enlarged. There is a trace pericardial effusion, new since 07/11/2021. The major vessels of the chest are unremarkable. Mediastinum/Nodes: The thyroid is unremarkable. The esophagus is grossly unremarkable. There is no mediastinal, hilar, or axillary lymphadenopathy. Lungs/Pleura: The trachea and central airways are patent. There is a moderate size left pleural effusion with hypoenhancing left basilar consolidation. Compared to the study from 07/11/2021, these findings are slightly worsened. The right lung is essentially clear with minimal linear opacities in the right base likely reflecting atelectasis. There is no pneumothorax. Upper Abdomen: The imaged portions of the upper abdominal viscera are unremarkable. Musculoskeletal: There is no acute osseous abnormality or aggressive osseous lesion. IMPRESSION: 1. Moderate size left pleural effusion with adjacent consolidation concerning for pneumonia, slightly worsened since the prior CT chest from 07/11/2021. Recommend continued imaging follow-up to resolution. 2. New trace pericardial effusion. Electronically Signed   By: Valetta Mole M.D.   On:  07/18/2021 11:25   DG CHEST PORT 1 VIEW  Result Date: 07/21/2021 CLINICAL DATA:  Chest pain.  Follow-up left pleural effusion. EXAM: PORTABLE CHEST 1 VIEW COMPARISON:  07/20/2021 FINDINGS: Left pleural pigtail catheter remains in place. A moderate left pleural effusion is unchanged in size. No pneumothorax visualized. Left lower lobe atelectasis is also stable. Right lung remains grossly clear. Heart size remains within normal limits. IMPRESSION: Stable moderate left pleural effusion and left lower lobe atelectasis. Electronically Signed   By: Marlaine Hind M.D.   On: 07/21/2021 08:46   DG CHEST PORT 1 VIEW  Result Date: 07/20/2021 CLINICAL DATA:  Chest tube placement EXAM: PORTABLE CHEST 1 VIEW COMPARISON:  Chest radiograph 07/19/2021 FINDINGS: The left sided pigtail catheter projects over the left base, unchanged. The cardiomediastinal silhouette is grossly stable. The moderate size left pleural effusion layering along the left chest wall is similar in size to the study from 1 day prior, though aeration of the left lung appears slightly improved. There is no pneumothorax. The right lung is clear. There is no right pleural effusion or pneumothorax. There is no acute osseous abnormality. IMPRESSION: Left chest tube in place with similar size of the left pleural effusion layering along the chest wall but slightly improved aeration of the left lung. No pneumothorax. Electronically Signed   By: Valetta Mole M.D.   On: 07/20/2021 07:27   DG CHEST PORT 1 VIEW  Result Date: 07/19/2021 CLINICAL DATA:  Follow-up of pleural effusion.  Chest tube in place. EXAM: PORTABLE CHEST 1 VIEW COMPARISON:  07/18/2021 FINDINGS: Left-sided pleural drain/pigtail catheter. Midline trachea. Normal heart size. Loculated moderate left pleural effusion is similar. No pneumothorax. Low lung volumes with resultant pulmonary interstitial prominence. Mild subsegmental atelectasis in the medial right lung base. Increase in left lower  lobe airspace disease. IMPRESSION: Left-sided pleural drain in place, with persistent loculated pleural fluid but no evidence of pneumothorax. Diminished lung volumes. Increased left base atelectasis or infection. Electronically Signed   By: Abigail Miyamoto M.D.   On: 07/19/2021 16:20   DG CHEST PORT 1 VIEW  Result Date: 07/18/2021 CLINICAL DATA:  Community acquired pneumonia EXAM: PORTABLE CHEST 1 VIEW COMPARISON:  07/16/2021 FINDINGS: Heart is normal size. Right lung remains clear. Moderate left pleural effusion with left lower lobe airspace disease, unchanged. No acute  bony abnormality. IMPRESSION: Continued moderate left effusion with left lower lobe airspace disease. Electronically Signed   By: Rolm Baptise M.D.   On: 07/18/2021 08:12   US Abdomen Limited RUQ (LIVER/GB)  Result Date: 07/18/2021 CLINICAL DATA:  Elevated LFTs EXAM: ULTRASOUND ABDOMEN LIMITED RIGHT UPPER QUADRANT COMPARISON:  CT abdomen and pelvis 07/12/2021 FINDINGS: Gallbladder: Mobile echogenic shadowing calculi. No significant wall thickening or pericholecystic fluid. No sonographic Murphy sign. Common bile duct: Diameter: 5 Liver: Mildly increased echogenicity with no focal mass identified. Portal vein is patent on color Doppler imaging with normal direction of blood flow towards the liver. Other: None. IMPRESSION: 1. Cholelithiasis. 2. Mildly increased echogenicity of the liver parenchyma which Lacek represent hepatic steatosis and/or other hepatocellular disease. Electronically Signed   By: Ofilia Neas M.D.   On: 07/18/2021 10:34

## 2021-07-21 NOTE — Procedures (Signed)
Pleural Fibrinolytic Administration Procedure Note  Deron Poole Rivenbark  [id number]  [birth year]  Date:07/21/21  Time:10:51 AM   Provider Performing:Kinzy Weyers B Amalia Edgecombe   Procedure: Pleural Fibrinolysis Subsequent day (36144)  Indication(s) Fibrinolysis of complicated pleural effusion  Consent Risks of the procedure as well as the alternatives and risks of each were explained to the patient and/or caregiver.  Consent for the procedure was obtained.   Anesthesia None   Time Out Verified patient identification, verified procedure, site/side was marked, verified correct patient position, special equipment/implants available, medications/allergies/relevant history reviewed, required imaging and test results available.   Sterile Technique Hand hygiene, gloves   Procedure Description Existing pleural catheter was cleaned and accessed in sterile manner.  10mg  of tPA in 30cc of saline and 5mg  of dornase in 30cc of sterile water were injected into pleural space using existing pleural catheter.  Catheter will be clamped for 1 hour and then placed back to suction.   Complications/Tolerance None; patient tolerated the procedure well.  EBL None   Specimen(s) None

## 2021-07-22 ENCOUNTER — Inpatient Hospital Stay (HOSPITAL_COMMUNITY): Payer: Medicaid Other

## 2021-07-22 DIAGNOSIS — J90 Pleural effusion, not elsewhere classified: Secondary | ICD-10-CM | POA: Diagnosis not present

## 2021-07-22 DIAGNOSIS — J869 Pyothorax without fistula: Secondary | ICD-10-CM | POA: Diagnosis not present

## 2021-07-22 LAB — CBC WITH DIFFERENTIAL/PLATELET
Abs Immature Granulocytes: 0.8 10*3/uL — ABNORMAL HIGH (ref 0.00–0.07)
Basophils Absolute: 0 10*3/uL (ref 0.0–0.1)
Basophils Relative: 0 %
Eosinophils Absolute: 0 10*3/uL (ref 0.0–0.5)
Eosinophils Relative: 0 %
HCT: 43.9 % (ref 39.0–52.0)
Hemoglobin: 14.9 g/dL (ref 13.0–17.0)
Lymphocytes Relative: 4 %
Lymphs Abs: 1 10*3/uL (ref 0.7–4.0)
MCH: 29.5 pg (ref 26.0–34.0)
MCHC: 33.9 g/dL (ref 30.0–36.0)
MCV: 86.9 fL (ref 80.0–100.0)
Monocytes Absolute: 2.6 10*3/uL — ABNORMAL HIGH (ref 0.1–1.0)
Monocytes Relative: 10 %
Myelocytes: 3 %
Neutro Abs: 21.2 10*3/uL — ABNORMAL HIGH (ref 1.7–7.7)
Neutrophils Relative %: 83 %
Platelets: 454 10*3/uL — ABNORMAL HIGH (ref 150–400)
RBC: 5.05 MIL/uL (ref 4.22–5.81)
RDW: 14.4 % (ref 11.5–15.5)
WBC: 25.6 10*3/uL — ABNORMAL HIGH (ref 4.0–10.5)
nRBC: 0 % (ref 0.0–0.2)
nRBC: 0 /100 WBC

## 2021-07-22 LAB — COMPREHENSIVE METABOLIC PANEL
ALT: 68 U/L — ABNORMAL HIGH (ref 0–44)
AST: 17 U/L (ref 15–41)
Albumin: 2 g/dL — ABNORMAL LOW (ref 3.5–5.0)
Alkaline Phosphatase: 136 U/L — ABNORMAL HIGH (ref 38–126)
Anion gap: 8 (ref 5–15)
BUN: 17 mg/dL (ref 6–20)
CO2: 28 mmol/L (ref 22–32)
Calcium: 7.9 mg/dL — ABNORMAL LOW (ref 8.9–10.3)
Chloride: 96 mmol/L — ABNORMAL LOW (ref 98–111)
Creatinine, Ser: 0.94 mg/dL (ref 0.61–1.24)
GFR, Estimated: >60 mL/min (ref >60)
Glucose, Bld: 125 mg/dL — ABNORMAL HIGH (ref 70–99)
Potassium: 4.7 mmol/L (ref 3.5–5.1)
Sodium: 132 mmol/L — ABNORMAL LOW (ref 135–145)
Total Bilirubin: 0.9 mg/dL (ref 0.3–1.2)
Total Protein: 5.8 g/dL — ABNORMAL LOW (ref 6.5–8.1)

## 2021-07-22 LAB — MAGNESIUM: Magnesium: 2 mg/dL (ref 1.7–2.4)

## 2021-07-22 LAB — PROCALCITONIN: Procalcitonin: 0.15 ng/mL

## 2021-07-22 LAB — BRAIN NATRIURETIC PEPTIDE: B Natriuretic Peptide: 11.7 pg/mL (ref 0.0–100.0)

## 2021-07-22 LAB — C-REACTIVE PROTEIN: CRP: 18 mg/dL — ABNORMAL HIGH (ref <1.0)

## 2021-07-22 MED ORDER — NYSTATIN 100000 UNIT/ML MT SUSP
5.0000 mL | Freq: Four times a day (QID) | OROMUCOSAL | Status: DC
  Administered 2021-07-22 – 2021-07-28 (×22): 500000 [IU] via ORAL
  Filled 2021-07-22 (×23): qty 5

## 2021-07-22 MED ORDER — IOHEXOL 300 MG/ML  SOLN
75.0000 mL | Freq: Once | INTRAMUSCULAR | Status: AC | PRN
  Administered 2021-07-22: 13:00:00 75 mL via INTRAVENOUS

## 2021-07-22 MED ORDER — ALUM & MAG HYDROXIDE-SIMETH 200-200-20 MG/5ML PO SUSP
30.0000 mL | Freq: Four times a day (QID) | ORAL | Status: DC | PRN
  Administered 2021-07-22: 10:00:00 30 mL via ORAL
  Filled 2021-07-22: qty 30

## 2021-07-22 NOTE — Progress Notes (Signed)
PCCM Update Many loculations remain on CT chest performed today despite 3 doses of TPA/DNAse to the left pleural space. CT surgery planning VATs decortication.   Please call with any questions or further assistance in the future. PCCM to sign off.  Freda Jackson, MD [facility] Office: [phone]   See Amion for personal pager PCCM on call pager [phone] until 7pm. Please call Elink 7p-7a. [phone]

## 2021-07-22 NOTE — Progress Notes (Signed)
Procedure(s) (LRB): VIDEO ASSISTED THORACOSCOPY (VATS)/EMPYEMA (Left) Subjective: Still short of breath and left chest pain.  Objective: Vital signs in last 24 hours: Temp:  [97.7 F (36.5 C)-98.9 F (37.2 C)] 98.9 F (37.2 C) (11/27 0421) Pulse Rate:  [86-92] 86 (11/27 0852) Cardiac Rhythm: Normal sinus rhythm (11/27 0849) Resp:  [14-20] 15 (11/27 0852) BP: (116-125)/(67-91) 117/67 (11/27 0421) SpO2:  [91 %-94 %] 91 % (11/27 0852)  Hemodynamic parameters for last 24 hours:    Intake/Output from previous day: 11/26 0701 - 11/27 0700 In: 210 [IV Piggyback:200] Out: 7673 [Urine:1250; Chest Tube:175] Intake/Output this shift: No intake/output data recorded.  General appearance: alert and cooperative, looks uncomfortable. Heart: regular rate and rhythm Lungs: diminished breath sounds LLL and LUL  Lab Results: Recent Labs    07/21/21 0106 07/22/21 0410  WBC 21.9* 25.6*  HGB 15.3 14.9  HCT 46.2 43.9  PLT 378 454*   BMET:  Recent Labs    07/21/21 0106 07/22/21 0107  NA 132* 132*  K 4.2 4.7  CL 95* 96*  CO2 28 28  GLUCOSE 118* 125*  BUN 19 17  CREATININE 0.92 0.94  CALCIUM 8.1* 7.9*    PT/INR: No results for input(s): LABPROT, INR in the last 72 hours. ABG No results found for: PHART, HCO3, TCO2, ACIDBASEDEF, O2SAT CBG (last 3)  No results for input(s): GLUCAP in the last 72 hours.   Narrative & Impression  CLINICAL DATA:  Old male with history of empyema.  Follow-up study.   EXAM: CT CHEST WITH CONTRAST   TECHNIQUE: Multidetector CT imaging of the chest was performed during intravenous contrast administration.   CONTRAST:  42mL OMNIPAQUE IOHEXOL 300 MG/ML  SOLN   COMPARISON:  Chest CT 07/18/2021.   FINDINGS: Cardiovascular: Heart size is normal. There is no significant pericardial fluid, thickening or pericardial calcification. There is aortic atherosclerosis, as well as atherosclerosis of the great vessels of the mediastinum and the coronary  arteries, including calcified atherosclerotic plaque in the left anterior descending coronary artery.   Mediastinum/Nodes: No pathologically enlarged mediastinal or hilar lymph nodes. Esophagus is unremarkable in appearance. No axillary lymphadenopathy.   Lungs/Pleura: Compared to the prior examination there has been interval placement of a small bore pigtail drainage catheter in the base of the left hemithorax. Previously noted loculated pleural fluid collection appears increased in size compared to the prior examination. Small amount of gas now present within the pleural cavity [place] be iatrogenic. Extensive passive atelectasis in the left lung, including complete atelectasis of the left lower lobe which was previously partially aerated. There is a small amount of atelectasis and probable airspace consolidation now lying dependently in the right lower lobe as well. No significant right pleural fluid.   Upper Abdomen: Unremarkable.   Musculoskeletal: There are no aggressive appearing lytic or blastic lesions noted in the visualized portions of the skeleton.   IMPRESSION: 1. Interval placement of pigtail drainage catheter in large loculated left pleural effusion. This effusion appears to of increased slightly in size compared to the prior examination, with worsening atelectasis in the left lung. 2. New area of atelectasis and some airspace consolidation in the right lower lobe, concerning for developing pneumonia or sequela of aspiration. 3. Aortic atherosclerosis, in addition to left anterior descending coronary artery disease. Please note that although the presence of coronary artery calcium documents the presence of coronary artery disease, the severity of this disease and any potential stenosis cannot be assessed on this non-gated CT examination. Assessment for potential  risk factor modification, dietary therapy or pharmacologic therapy [place] be warranted, if clinically  indicated.   Aortic Atherosclerosis (ICD10-I70.0).     Electronically Signed   By: Vinnie Langton M.D.   On: 07/22/2021 12:47     Assessment/Plan:  CT chest this am shows increased loculated left empyema with compressive atelectasis of left lung. Small right effusion and atelectasis. Will plan to proceed with left thoracotomy for drainage of empyema tomorrow afternoon. I reviewed the CT image with the patient and his wife and surgical procedure, benefits and risks. They agree to proceed. Will do tomorrow afternoon.   LOS: 10 days    Gaye Pollack 07/22/2021

## 2021-07-22 NOTE — H&P (View-Only) (Signed)
Procedure(s) (LRB): VIDEO ASSISTED THORACOSCOPY (VATS)/EMPYEMA (Left) Subjective: Still short of breath and left chest pain.  Objective: Vital signs in last 24 hours: Temp:  [97.7 F (36.5 C)-98.9 F (37.2 C)] 98.9 F (37.2 C) (11/27 0421) Pulse Rate:  [86-92] 86 (11/27 0852) Cardiac Rhythm: Normal sinus rhythm (11/27 0849) Resp:  [14-20] 15 (11/27 0852) BP: (116-125)/(67-91) 117/67 (11/27 0421) SpO2:  [91 %-94 %] 91 % (11/27 0852)  Hemodynamic parameters for last 24 hours:    Intake/Output from previous day: 11/26 0701 - 11/27 0700 In: 210 [IV Piggyback:200] Out: 4098 [Urine:1250; Chest Tube:175] Intake/Output this shift: No intake/output data recorded.  General appearance: alert and cooperative, looks uncomfortable. Heart: regular rate and rhythm Lungs: diminished breath sounds LLL and LUL  Lab Results: Recent Labs    07/21/21 0106 07/22/21 0410  WBC 21.9* 25.6*  HGB 15.3 14.9  HCT 46.2 43.9  PLT 378 454*   BMET:  Recent Labs    07/21/21 0106 07/22/21 0107  NA 132* 132*  K 4.2 4.7  CL 95* 96*  CO2 28 28  GLUCOSE 118* 125*  BUN 19 17  CREATININE 0.92 0.94  CALCIUM 8.1* 7.9*    PT/INR: No results for input(s): LABPROT, INR in the last 72 hours. ABG No results found for: PHART, HCO3, TCO2, ACIDBASEDEF, O2SAT CBG (last 3)  No results for input(s): GLUCAP in the last 72 hours.   Narrative & Impression  CLINICAL DATA:  Old male with history of empyema.  Follow-up study.   EXAM: CT CHEST WITH CONTRAST   TECHNIQUE: Multidetector CT imaging of the chest was performed during intravenous contrast administration.   CONTRAST:  92mL OMNIPAQUE IOHEXOL 300 MG/ML  SOLN   COMPARISON:  Chest CT 07/18/2021.   FINDINGS: Cardiovascular: Heart size is normal. There is no significant pericardial fluid, thickening or pericardial calcification. There is aortic atherosclerosis, as well as atherosclerosis of the great vessels of the mediastinum and the coronary  arteries, including calcified atherosclerotic plaque in the left anterior descending coronary artery.   Mediastinum/Nodes: No pathologically enlarged mediastinal or hilar lymph nodes. Esophagus is unremarkable in appearance. No axillary lymphadenopathy.   Lungs/Pleura: Compared to the prior examination there has been interval placement of a small bore pigtail drainage catheter in the base of the left hemithorax. Previously noted loculated pleural fluid collection appears increased in size compared to the prior examination. Small amount of gas now present within the pleural cavity [place] be iatrogenic. Extensive passive atelectasis in the left lung, including complete atelectasis of the left lower lobe which was previously partially aerated. There is a small amount of atelectasis and probable airspace consolidation now lying dependently in the right lower lobe as well. No significant right pleural fluid.   Upper Abdomen: Unremarkable.   Musculoskeletal: There are no aggressive appearing lytic or blastic lesions noted in the visualized portions of the skeleton.   IMPRESSION: 1. Interval placement of pigtail drainage catheter in large loculated left pleural effusion. This effusion appears to of increased slightly in size compared to the prior examination, with worsening atelectasis in the left lung. 2. New area of atelectasis and some airspace consolidation in the right lower lobe, concerning for developing pneumonia or sequela of aspiration. 3. Aortic atherosclerosis, in addition to left anterior descending coronary artery disease. Please note that although the presence of coronary artery calcium documents the presence of coronary artery disease, the severity of this disease and any potential stenosis cannot be assessed on this non-gated CT examination. Assessment for potential  risk factor modification, dietary therapy or pharmacologic therapy [place] be warranted, if clinically  indicated.   Aortic Atherosclerosis (ICD10-I70.0).     Electronically Signed   By: Vinnie Langton M.D.   On: 07/22/2021 12:47     Assessment/Plan:  CT chest this am shows increased loculated left empyema with compressive atelectasis of left lung. Small right effusion and atelectasis. Will plan to proceed with left thoracotomy for drainage of empyema tomorrow afternoon. I reviewed the CT image with the patient and his wife and surgical procedure, benefits and risks. They agree to proceed. Will do tomorrow afternoon.   LOS: 10 days    Gaye Pollack 07/22/2021

## 2021-07-22 NOTE — Progress Notes (Signed)
PROGRESS NOTE                                                                                                                                                                                                             Patient Demographics:    Victor [name], is a 45 y.o. male, DOB - 1976/04/22, OEV:[id number]  Outpatient Primary MD for the patient is Victor Norlander, DO    LOS - 10  Admit date - 07/11/2021    Chief Complaint  Patient presents with   Shortness of Breath       Brief Narrative (HPI from H&P)   45 year old male with a history of hypertension, hyperlipidemia, tobacco abuse, GERD, chronic knee pain status post right TKA and multiple revisions, on home narcotics and as needed Narcan, he presented to the [facility] with 1 week history of  cough shortness of breath along with left-sided pleuritic chest pain, he was diagnosed with pneumonia and admitted to the hospital on 07/11/2021, by that time he had symptoms for 1 week already.  Despite appropriate treatment he continued to have left therapeutic chest pain CT scan demonstrated moderate to large left-sided pleural effusion around the infiltrate suspicious for parapneumonic effusion versus empyema he was transferred to [facility] for further care.   Subjective:   Patient in bed appears to be in mild distress due to left-sided chest pain, mild shortness of breath, no fever chills, no nausea.  Some heartburn.  No focal weakness.   Assessment  & Plan :    1. Acute Hypoxic Resp. Failure due to CAP with L. Sided infiltrate and surrounding moderate to large effusion - no history of travel or sick contact exposure, he does take some narcotics at home for chronic knee pain, question if he at some point had some microaspiration which she does not recall, has been treated with empiric IV antibiotics for a week without much improvement. He is currently on Meropenem,   underwent left pigtail catheter placement by IR on 07/19/2021, follow culture results, fluid appears parapneumonic versus empyema, tPA being administered serially via pigtail by pulmonary starting on 07/20/2021, unfortunately with appropriate care his CRP and leukocytosis are still climbing, I have consulted cardiothoracic surgery who agreed to repeat CT scan on 07/22/2021 and if clinically indicated plan for left thoracotomy for surgical debridement on 07/23/2021,  appreciate their help.   2.  Multiple right knee surgeries with chronic pain.  Supportive care.  On narcotics at home.  3.  Hypertension.  Placed on Coreg will monitor.  4.  Smoking.  Counseled to quit smoking has 30-pack-year history.  5.  Incidental finding of fatty liver with obesity.  BMI of 34.  Follow with PCP for weight loss outpatient GI follow-up to be arranged by PCP.  6.  Trace pericardial effusion noted on CT scan.  Symptom-free with stable echocardiogram preserved EF and no pericardial effusion on echo.      Condition - fair  Family Communication  :  Family bedside on 07/19/2021, 07/20/2021, 07/22/2019, 07/22/21 Code Status :  Full  Consults  :  PCCM, cardiothoracic surgery  PUD Prophylaxis :    Procedures  :     CT 07/22/21 -  CT 07/18/21 - 1. Moderate size left pleural effusion with adjacent consolidation concerning for pneumonia, slightly worsened since the prior CT chest from 07/11/2021. Recommend continued imaging follow-up to resolution. 2. New trace pericardial effusion.  Right upper quadrant ultrasound.  Gallstones and fatty liver  L. Lung Pigtail catheter placed by IR 07/19/21  Echocardiogram  1. Left ventricular ejection fraction, by estimation, is 65 to 70%. The left ventricle has normal function. The left ventricle has no regional wall motion abnormalities. There is mild left ventricular hypertrophy. Left ventricular diastolic parameters were normal.  2. Right ventricular systolic function is  normal. The right ventricular size is normal. Tricuspid regurgitation signal is inadequate for assessing PA pressure.  3. The mitral valve is normal in structure. No evidence of mitral valve regurgitation.  4. The aortic valve was not well visualized. Aortic valve regurgitation is not visualized. No aortic stenosis is present.      Disposition Plan  :    Status is: Inpatient  Remains inpatient appropriate because: Evaluation of left parapneumonic effusion/empyema Criss require pigtail catheter placement.  DVT Prophylaxis  :  Lovenox  Place TED hose Start: 07/17/21 1214 SCDs Start: 07/12/21 0443    Lab Results  Component Value Date   PLT 454 (H) 07/22/2021    Diet :  Diet Order             Diet Heart Room service appropriate? Yes; Fluid consistency: Thin  Diet effective now                    Inpatient Medications  Scheduled Meds:  arformoterol  15 mcg Nebulization BID   budesonide (PULMICORT) nebulizer solution  0.5 mg Nebulization BID   calcium carbonate  1 tablet Oral Q breakfast   carvedilol  6.25 mg Oral BID WC   Chlorhexidine Gluconate Cloth  6 each Topical Daily   dextromethorphan-guaiFENesin  1 tablet Oral BID   docusate sodium  100 mg Oral BID   enoxaparin (LOVENOX) injection  60 mg Subcutaneous Daily   ipratropium  0.5 mg Nebulization BID   levalbuterol  0.63 mg Nebulization BID   lidocaine  1 patch Transdermal Q24H   nicotine  21 mg Transdermal Daily   pantoprazole  40 mg Oral BID   sodium chloride flush  10 mL Other Q8H   Continuous Infusions:  meropenem (MERREM) IV Stopped (07/22/21 0523)   PRN Meds:.acetaminophen **OR** acetaminophen, acetaminophen, alum & mag hydroxide-simeth, HYDROmorphone (DILAUDID) injection, levalbuterol, methocarbamol, oxyCODONE, sodium chloride  Time Spent in minutes  30   Lala Lund M.D on 07/22/2021 at 9:15 AM  To page go to [email or web address]  [facility] -  Office  [phone]  See all Orders from today  for further details    Objective:   Vitals:   07/21/21 1937 07/21/21 2346 07/22/21 0421 07/22/21 0852  BP: (!) 124/91 116/87 117/67   Pulse: 86 90 88 86  Resp: 18 15 14 15   Temp: 97.7 F (36.5 C) 98.3 F (36.8 C) 98.9 F (37.2 C)   TempSrc: Oral Oral Oral   SpO2: 94% 91% 93% 91%  Weight:      Height:        Wt Readings from Last 3 Encounters:  07/12/21 113.2 kg  07/02/21 113.9 kg  03/29/21 113 kg     Intake/Output Summary (Last 24 hours) at 07/22/2021 0915 Last data filed at 07/22/2021 0600 Gross per 24 hour  Intake 210 ml  Output 1425 ml  Net -1215 ml     Physical Exam  Awake Alert, No new F.N deficits, Normal affect [place].AT,PERRAL Supple Neck, No JVD,   Symmetrical Chest wall movement, reduced left basilar breath sounds with left pigtail catheter in place RRR,No Gallops, Rubs or new Murmurs,  +ve B.Sounds, Abd Soft, No tenderness,   No Cyanosis, Clubbing or edema         Data Review:    CBC Recent Labs  Lab 07/18/21 0437 07/19/21 0127 07/20/21 0101 07/21/21 0106 07/22/21 0410  WBC 14.8* 15.1* 20.9* 21.9* 25.6*  HGB 13.1 13.4 15.3 15.3 14.9  HCT 40.1 41.0 48.0 46.2 43.9  PLT 375 384 425* 378 454*  MCV 91.1 88.7 89.7 88.0 86.9  MCH 29.8 29.0 28.6 29.1 29.5  MCHC 32.7 32.7 31.9 33.1 33.9  RDW 14.5 14.6 14.6 14.4 14.4  LYMPHSABS  --   --  2.9 2.7 1.0  MONOABS  --   --  1.3* 2.2* 2.6*  EOSABS  --   --  0.2 0.2 0.0  BASOSABS  --   --  0.0 0.1 0.0    Electrolytes Recent Labs  Lab 07/17/21 0428 07/18/21 0437 07/19/21 0127 07/19/21 0746 07/20/21 0101 07/21/21 0106 07/22/21 0107  NA 139 140 136  --  132* 132* 132*  K 3.9 3.9 3.9  --  5.1 4.2 4.7  CL 102 101 99  --  98 95* 96*  CO2 30 31 29   --  24 28 28   GLUCOSE 160* 100* 119*  --  110* 118* 125*  BUN 16 18 12   --  17 19 17   CREATININE 0.75 0.86 0.85  --  0.92 0.92 0.94  CALCIUM 8.2* 8.1* 8.0*  --  8.2* 8.1* 7.9*  AST 100* 81* 38  --  50* 34 17  ALT 267* 253* 179*  --  148* 106* 68*   ALKPHOS 173* 180* 168*  --  174* 164* 136*  BILITOT 0.2* 0.3 0.4  --  0.9 0.8 0.9  ALBUMIN 2.2* 2.2* 2.1*  --  2.3* 2.0* 2.0*  MG  --   --  2.3  --  2.3 2.1 2.0  CRP  --   --   --  7.7* 13.1* 18.9* 18.0*  PROCALCITON  --   --   --  0.12 0.31 0.23 0.15  BNP 107.0*  --   --  6.0 4.0 8.1 11.7    ------------------------------------------------------------------------------------------------------------------ No results for input(s): CHOL, HDL, LDLCALC, TRIG, CHOLHDL, LDLDIRECT in the last 72 hours.  Lab Results  Component Value Date   HGBA1C 5.4 03/03/2018    No results for input(s): TSH, T4TOTAL, T3FREE, THYROIDAB in the  last 72 hours.  Invalid input(s): FREET3 ------------------------------------------------------------------------------------------------------------------ ID Labs Recent Labs  Lab 07/18/21 0437 07/19/21 0127 07/19/21 0746 07/20/21 0101 07/21/21 0106 07/22/21 0107 07/22/21 0410  WBC 14.8* 15.1*  --  20.9* 21.9*  --  25.6*  PLT 375 384  --  425* 378  --  454*  CRP  --   --  7.7* 13.1* 18.9* 18.0*  --   PROCALCITON  --   --  0.12 0.31 0.23 0.15  --   CREATININE 0.86 0.85  --  0.92 0.92 0.94  --    Cardiac Enzymes No results for input(s): CKMB, TROPONINI, MYOGLOBIN in the last 168 hours.  Invalid input(s): CK    Radiology Reports CT CHEST W CONTRAST  Result Date: 07/18/2021 CLINICAL DATA:  Left-sided chest pain, shortness of breath, productive cough EXAM: CT CHEST WITH CONTRAST TECHNIQUE: Multidetector CT imaging of the chest was performed during intravenous contrast administration. CONTRAST:  86mL OMNIPAQUE IOHEXOL 300 MG/ML  SOLN COMPARISON:  Chest radiograph most recently dated the same day, CT a chest 07/11/2021 FINDINGS: Cardiovascular: The heart is not enlarged. There is a trace pericardial effusion, new since 07/11/2021. The major vessels of the chest are unremarkable. Mediastinum/Nodes: The thyroid is unremarkable. The esophagus is grossly  unremarkable. There is no mediastinal, hilar, or axillary lymphadenopathy. Lungs/Pleura: The trachea and central airways are patent. There is a moderate size left pleural effusion with hypoenhancing left basilar consolidation. Compared to the study from 07/11/2021, these findings are slightly worsened. The right lung is essentially clear with minimal linear opacities in the right base likely reflecting atelectasis. There is no pneumothorax. Upper Abdomen: The imaged portions of the upper abdominal viscera are unremarkable. Musculoskeletal: There is no acute osseous abnormality or aggressive osseous lesion. IMPRESSION: 1. Moderate size left pleural effusion with adjacent consolidation concerning for pneumonia, slightly worsened since the prior CT chest from 07/11/2021. Recommend continued imaging follow-up to resolution. 2. New trace pericardial effusion. Electronically Signed   By: Valetta Mole M.D.   On: 07/18/2021 11:25   DG CHEST PORT 1 VIEW  Result Date: 07/21/2021 CLINICAL DATA:  Chest pain.  Follow-up left pleural effusion. EXAM: PORTABLE CHEST 1 VIEW COMPARISON:  07/20/2021 FINDINGS: Left pleural pigtail catheter remains in place. A moderate left pleural effusion is unchanged in size. No pneumothorax visualized. Left lower lobe atelectasis is also stable. Right lung remains grossly clear. Heart size remains within normal limits. IMPRESSION: Stable moderate left pleural effusion and left lower lobe atelectasis. Electronically Signed   By: Marlaine Hind M.D.   On: 07/21/2021 08:46   DG CHEST PORT 1 VIEW  Result Date: 07/20/2021 CLINICAL DATA:  Chest tube placement EXAM: PORTABLE CHEST 1 VIEW COMPARISON:  Chest radiograph 07/19/2021 FINDINGS: The left sided pigtail catheter projects over the left base, unchanged. The cardiomediastinal silhouette is grossly stable. The moderate size left pleural effusion layering along the left chest wall is similar in size to the study from 1 day prior, though aeration  of the left lung appears slightly improved. There is no pneumothorax. The right lung is clear. There is no right pleural effusion or pneumothorax. There is no acute osseous abnormality. IMPRESSION: Left chest tube in place with similar size of the left pleural effusion layering along the chest wall but slightly improved aeration of the left lung. No pneumothorax. Electronically Signed   By: Valetta Mole M.D.   On: 07/20/2021 07:27   DG CHEST PORT 1 VIEW  Result Date: 07/19/2021 CLINICAL DATA:  Follow-up of  pleural effusion.  Chest tube in place. EXAM: PORTABLE CHEST 1 VIEW COMPARISON:  07/18/2021 FINDINGS: Left-sided pleural drain/pigtail catheter. Midline trachea. Normal heart size. Loculated moderate left pleural effusion is similar. No pneumothorax. Low lung volumes with resultant pulmonary interstitial prominence. Mild subsegmental atelectasis in the medial right lung base. Increase in left lower lobe airspace disease. IMPRESSION: Left-sided pleural drain in place, with persistent loculated pleural fluid but no evidence of pneumothorax. Diminished lung volumes. Increased left base atelectasis or infection. Electronically Signed   By: Abigail Miyamoto M.D.   On: 07/19/2021 16:20   ECHOCARDIOGRAM COMPLETE  Result Date: 07/21/2021    ECHOCARDIOGRAM REPORT   Patient Name:   Victor [name] Date of Exam: 07/21/2021 Medical Rec #:  [id number]    Height:       71.0 in Accession #:    [id number]   Weight:       249.6 lb Date of Birth:  10-21-1975   BSA:          2.317 m Patient Age:    13 years     BP:           125/78 mmHg Patient Gender: M            HR:           89 bpm. Exam Location:  Inpatient Procedure: 2D Echo, Cardiac Doppler and Color Doppler Indications:    CHF  History:        Patient has no prior history of Echocardiogram examinations.                 Pleural drain in place.  Sonographer:    [name] RDCS Referring Phys: [id number] [place]  1. Left ventricular ejection fraction, by  estimation, is 65 to 70%. The left ventricle has normal function. The left ventricle has no regional wall motion abnormalities. There is mild left ventricular hypertrophy. Left ventricular diastolic parameters were normal.  2. Right ventricular systolic function is normal. The right ventricular size is normal. Tricuspid regurgitation signal is inadequate for assessing PA pressure.  3. The mitral valve is normal in structure. No evidence of mitral valve regurgitation.  4. The aortic valve was not well visualized. Aortic valve regurgitation is not visualized. No aortic stenosis is present. FINDINGS  Left Ventricle: Left ventricular ejection fraction, by estimation, is 65 to 70%. The left ventricle has normal function. The left ventricle has no regional wall motion abnormalities. The left ventricular internal cavity size was normal in size. There is  mild left ventricular hypertrophy. Left ventricular diastolic parameters were normal. Right Ventricle: The right ventricular size is normal. No increase in right ventricular wall thickness. Right ventricular systolic function is normal. Tricuspid regurgitation signal is inadequate for assessing PA pressure. Left Atrium: Left atrial size was normal in size. Right Atrium: Right atrial size was normal in size. Pericardium: There is no evidence of pericardial effusion. Mitral Valve: The mitral valve is normal in structure. No evidence of mitral valve regurgitation. Tricuspid Valve: The tricuspid valve is normal in structure. Tricuspid valve regurgitation is trivial. Aortic Valve: The aortic valve was not well visualized. Aortic valve regurgitation is not visualized. No aortic stenosis is present. Pulmonic Valve: The pulmonic valve was not well visualized. Pulmonic valve regurgitation is not visualized. Aorta: The aortic root is normal in size and structure. IAS/Shunts: The interatrial septum was not well visualized.  LEFT VENTRICLE PLAX 2D LVIDd:         4.30 cm  Diastology  LVIDs:         3.00 cm   LV e' medial:    6.53 cm/s LV PW:         1.10 cm   LV E/e' medial:  8.3 LV IVS:        1.10 cm   LV e' lateral:   9.25 cm/s LVOT diam:     2.30 cm   LV E/e' lateral: 5.9 LV SV:         76 LV SV Index:   33 LVOT Area:     4.15 cm  RIGHT VENTRICLE RV Basal diam:  3.20 cm LEFT ATRIUM             Index        RIGHT ATRIUM          Index LA diam:        4.00 cm 1.73 cm/m   RA Area:     9.00 cm LA Vol (A2C):   89.7 ml 38.72 ml/m  RA Volume:   14.30 ml 6.17 ml/m LA Vol (A4C):   63.9 ml 27.58 ml/m LA Biplane Vol: 77.7 ml 33.54 ml/m  AORTIC VALVE LVOT Vmax:   121.00 cm/s LVOT Vmean:  78.600 cm/s LVOT VTI:    0.182 m  AORTA Ao Root diam: 3.40 cm MITRAL VALVE MV Area (PHT): 3.48 cm    SHUNTS MV Decel Time: 218 msec    Systemic VTI:  0.18 m MV E velocity: 54.50 cm/s  Systemic Diam: 2.30 cm MV A velocity: 70.70 cm/s MV E/A ratio:  0.77 Oswaldo Milian MD Electronically signed by Oswaldo Milian MD Signature Date/Time: 07/21/2021/5:51:59 PM    Final    US Abdomen Limited RUQ (LIVER/GB)  Result Date: 07/18/2021 CLINICAL DATA:  Elevated LFTs EXAM: ULTRASOUND ABDOMEN LIMITED RIGHT UPPER QUADRANT COMPARISON:  CT abdomen and pelvis 07/12/2021 FINDINGS: Gallbladder: Mobile echogenic shadowing calculi. No significant wall thickening or pericholecystic fluid. No sonographic Murphy sign. Common bile duct: Diameter: 5 Liver: Mildly increased echogenicity with no focal mass identified. Portal vein is patent on color Doppler imaging with normal direction of blood flow towards the liver. Other: None. IMPRESSION: 1. Cholelithiasis. 2. Mildly increased echogenicity of the liver parenchyma which Kruckenberg represent hepatic steatosis and/or other hepatocellular disease. Electronically Signed   By: Ofilia Neas M.D.   On: 07/18/2021 10:34

## 2021-07-22 NOTE — Progress Notes (Signed)
Pharmacy Antibiotic Note  Victor [name] is a 45 y.o. male admitted on 07/11/2021 with pneumonia.  Pharmacy has been consulted for meropenem dosing.  Patient with persistently elevated WBC (25.6) and CRP (18.0) not improving on antibiotics. CXR shows left pleural effusion that has worsened since 11/16. Repeat CT ordered today and possible plans for thoracotomy & surgical debridement 11/28 pending the results. Today is day 12 of antibiotics.   Plan: Meropenem 1g IV q8h Monitor cultures & sensitivities Narrow as able  Height: 5\' 11"  (180.3 cm) Weight: 113.2 kg (249 lb 9 oz) IBW/kg (Calculated) : 75.3  Temp (24hrs), Avg:98.1 F (36.7 C), Min:97.7 F (36.5 C), Max:98.9 F (37.2 C)  Recent Labs  Lab 07/18/21 0437 07/19/21 0127 07/20/21 0101 07/21/21 0106 07/22/21 0107 07/22/21 0410  WBC 14.8* 15.1* 20.9* 21.9*  --  25.6*  CREATININE 0.86 0.85 0.92 0.92 0.94  --     Estimated Creatinine Clearance: 124.4 mL/min (by C-G formula based on SCr of 0.94 mg/dL).    No Known Allergies  Antimicrobials this admission: Merrem 11/26>> Levaquin PO 11/24>>11/26 Unasyn 11/24>>11/26 Azith 11/16 >>11/20 CTX 11/16 >>11/23  Microbiology results: 11/16 [place]: neg 11/16 COVID and flu: neg 11/17 MRSA PCR in negative 11/19 sputum: normal flora 11/24 pleural fluid: cloudy, inc neutrophils, high LD  Thank you for allowing pharmacy to be a part of this patient's care.  Michae Pore 07/22/2021 11:11 AM

## 2021-07-23 ENCOUNTER — Inpatient Hospital Stay (HOSPITAL_COMMUNITY): Payer: Medicaid Other

## 2021-07-23 ENCOUNTER — Encounter (HOSPITAL_COMMUNITY)
Admission: EM | Disposition: A | Discharge status: Home / Self Care | Payer: Self-pay | Source: Ambulatory Visit | Attending: Internal Medicine

## 2021-07-23 ENCOUNTER — Encounter (HOSPITAL_COMMUNITY): Payer: Self-pay | Admitting: Internal Medicine

## 2021-07-23 ENCOUNTER — Inpatient Hospital Stay (HOSPITAL_COMMUNITY): Payer: Medicaid Other | Admitting: Critical Care Medicine

## 2021-07-23 DIAGNOSIS — J9601 Acute respiratory failure with hypoxia: Secondary | ICD-10-CM | POA: Diagnosis not present

## 2021-07-23 DIAGNOSIS — J90 Pleural effusion, not elsewhere classified: Secondary | ICD-10-CM | POA: Diagnosis not present

## 2021-07-23 DIAGNOSIS — J869 Pyothorax without fistula: Secondary | ICD-10-CM | POA: Diagnosis present

## 2021-07-23 DIAGNOSIS — J189 Pneumonia, unspecified organism: Secondary | ICD-10-CM | POA: Diagnosis not present

## 2021-07-23 HISTORY — PX: DECORTICATION: SHX5101

## 2021-07-23 HISTORY — PX: THORACOTOMY/LOBECTOMY: SHX6116

## 2021-07-23 LAB — SODIUM, URINE, RANDOM: Sodium, Ur: 79 mmol/L

## 2021-07-23 LAB — URINALYSIS, ROUTINE W REFLEX MICROSCOPIC
Bilirubin Urine: NEGATIVE
Glucose, UA: NEGATIVE mg/dL
Hgb urine dipstick: NEGATIVE
Ketones, ur: NEGATIVE mg/dL
Leukocytes,Ua: NEGATIVE
Nitrite: NEGATIVE
Protein, ur: NEGATIVE mg/dL
Specific Gravity, Urine: 1.025 (ref 1.005–1.030)
pH: 6 (ref 5.0–8.0)

## 2021-07-23 LAB — COMPREHENSIVE METABOLIC PANEL
ALT: 43 U/L (ref 0–44)
AST: 13 U/L — ABNORMAL LOW (ref 15–41)
Albumin: 1.7 g/dL — ABNORMAL LOW (ref 3.5–5.0)
Alkaline Phosphatase: 116 U/L (ref 38–126)
Anion gap: 7 (ref 5–15)
BUN: 16 mg/dL (ref 6–20)
CO2: 30 mmol/L (ref 22–32)
Calcium: 7.5 mg/dL — ABNORMAL LOW (ref 8.9–10.3)
Chloride: 92 mmol/L — ABNORMAL LOW (ref 98–111)
Creatinine, Ser: 0.88 mg/dL (ref 0.61–1.24)
GFR, Estimated: >60 mL/min (ref >60)
Glucose, Bld: 104 mg/dL — ABNORMAL HIGH (ref 70–99)
Potassium: 3.5 mmol/L (ref 3.5–5.1)
Sodium: 129 mmol/L — ABNORMAL LOW (ref 135–145)
Total Bilirubin: 0.4 mg/dL (ref 0.3–1.2)
Total Protein: 5.3 g/dL — ABNORMAL LOW (ref 6.5–8.1)

## 2021-07-23 LAB — CBC WITH DIFFERENTIAL/PLATELET
Abs Immature Granulocytes: 0.48 10*3/uL — ABNORMAL HIGH (ref 0.00–0.07)
Basophils Absolute: 0.1 10*3/uL (ref 0.0–0.1)
Basophils Relative: 0 %
Eosinophils Absolute: 0.1 10*3/uL (ref 0.0–0.5)
Eosinophils Relative: 0 %
HCT: 37.2 % — ABNORMAL LOW (ref 39.0–52.0)
Hemoglobin: 12.2 g/dL — ABNORMAL LOW (ref 13.0–17.0)
Immature Granulocytes: 3 %
Lymphocytes Relative: 14 %
Lymphs Abs: 2.7 10*3/uL (ref 0.7–4.0)
MCH: 29 pg (ref 26.0–34.0)
MCHC: 32.8 g/dL (ref 30.0–36.0)
MCV: 88.4 fL (ref 80.0–100.0)
Monocytes Absolute: 2.3 10*3/uL — ABNORMAL HIGH (ref 0.1–1.0)
Monocytes Relative: 12 %
Neutro Abs: 13.8 10*3/uL — ABNORMAL HIGH (ref 1.7–7.7)
Neutrophils Relative %: 71 %
Platelets: 398 10*3/uL (ref 150–400)
RBC: 4.21 MIL/uL — ABNORMAL LOW (ref 4.22–5.81)
RDW: 14.4 % (ref 11.5–15.5)
WBC: 19.4 10*3/uL — ABNORMAL HIGH (ref 4.0–10.5)
nRBC: 0 % (ref 0.0–0.2)

## 2021-07-23 LAB — POCT I-STAT 7, (LYTES, BLD GAS, ICA,H+H)
Acid-Base Excess: 4 mmol/L — ABNORMAL HIGH (ref 0.0–2.0)
Acid-Base Excess: 6 mmol/L — ABNORMAL HIGH (ref 0.0–2.0)
Bicarbonate: 31.1 mmol/L — ABNORMAL HIGH (ref 20.0–28.0)
Bicarbonate: 32.3 mmol/L — ABNORMAL HIGH (ref 20.0–28.0)
Calcium, Ion: 1.12 mmol/L — ABNORMAL LOW (ref 1.15–1.40)
Calcium, Ion: 1.12 mmol/L — ABNORMAL LOW (ref 1.15–1.40)
HCT: 41 % (ref 39.0–52.0)
HCT: 41 % (ref 39.0–52.0)
Hemoglobin: 13.9 g/dL (ref 13.0–17.0)
Hemoglobin: 13.9 g/dL (ref 13.0–17.0)
O2 Saturation: 86 %
O2 Saturation: 89 %
Potassium: 4.7 mmol/L (ref 3.5–5.1)
Potassium: 4.7 mmol/L (ref 3.5–5.1)
Sodium: 135 mmol/L (ref 135–145)
Sodium: 135 mmol/L (ref 135–145)
TCO2: 33 mmol/L — ABNORMAL HIGH (ref 22–32)
TCO2: 34 mmol/L — ABNORMAL HIGH (ref 22–32)
pCO2 arterial: 54.9 mmHg — ABNORMAL HIGH (ref 32.0–48.0)
pCO2 arterial: 53.1 mmHg — ABNORMAL HIGH (ref 32.0–48.0)
pH, Arterial: 7.361 (ref 7.350–7.450)
pH, Arterial: 7.392 (ref 7.350–7.450)
pO2, Arterial: 54 mmHg — ABNORMAL LOW (ref 83.0–108.0)
pO2, Arterial: 58 mmHg — ABNORMAL LOW (ref 83.0–108.0)

## 2021-07-23 LAB — BRAIN NATRIURETIC PEPTIDE: B Natriuretic Peptide: 7.1 pg/mL (ref 0.0–100.0)

## 2021-07-23 LAB — URIC ACID: Uric Acid, Serum: 3.3 mg/dL — ABNORMAL LOW (ref 3.7–8.6)

## 2021-07-23 LAB — TYPE AND SCREEN
ABO/RH(D): A NEG
Antibody Screen: NEGATIVE

## 2021-07-23 LAB — CYTOLOGY - NON PAP

## 2021-07-23 LAB — OSMOLALITY, URINE: Osmolality, Ur: 840 mOsm/kg (ref 300–900)

## 2021-07-23 LAB — GLUCOSE, CAPILLARY
Glucose-Capillary: 139 mg/dL — ABNORMAL HIGH (ref 70–99)
Glucose-Capillary: 148 mg/dL — ABNORMAL HIGH (ref 70–99)

## 2021-07-23 LAB — OSMOLALITY: Osmolality: 280 mOsm/kg (ref 275–295)

## 2021-07-23 LAB — C-REACTIVE PROTEIN: CRP: 22.5 mg/dL — ABNORMAL HIGH (ref <1.0)

## 2021-07-23 LAB — PROCALCITONIN: Procalcitonin: 0.2 ng/mL

## 2021-07-23 LAB — CREATININE, URINE, RANDOM: Creatinine, Urine: 164.68 mg/dL

## 2021-07-23 LAB — MAGNESIUM: Magnesium: 2.1 mg/dL (ref 1.7–2.4)

## 2021-07-23 SURGERY — LOBECTOMY, LUNG, OPEN
Anesthesia: General | Site: Chest | Laterality: Left

## 2021-07-23 MED ORDER — MIDAZOLAM HCL 2 MG/2ML IJ SOLN
INTRAMUSCULAR | Status: AC
  Filled 2021-07-23: qty 2

## 2021-07-23 MED ORDER — BISACODYL 5 MG PO TBEC
10.0000 mg | DELAYED_RELEASE_TABLET | Freq: Every day | ORAL | Status: DC
  Administered 2021-07-24 – 2021-07-28 (×5): 10 mg via ORAL
  Filled 2021-07-23 (×5): qty 2

## 2021-07-23 MED ORDER — LACTATED RINGERS IV SOLN: INTRAVENOUS | Status: DC

## 2021-07-23 MED ORDER — PROPOFOL 10 MG/ML IV BOLUS
INTRAVENOUS | Status: AC
  Filled 2021-07-23: qty 20

## 2021-07-23 MED ORDER — KETOROLAC TROMETHAMINE 15 MG/ML IJ SOLN
INTRAMUSCULAR | Status: AC
  Administered 2021-07-23: 18:00:00 15 mg via INTRAVENOUS
  Filled 2021-07-23: qty 1

## 2021-07-23 MED ORDER — SENNOSIDES-DOCUSATE SODIUM 8.6-50 MG PO TABS
1.0000 | ORAL_TABLET | Freq: Every day | ORAL | Status: DC
  Administered 2021-07-23 – 2021-07-27 (×5): 1 via ORAL
  Filled 2021-07-23 (×5): qty 1

## 2021-07-23 MED ORDER — PROPOFOL 10 MG/ML IV BOLUS
INTRAVENOUS | Status: DC | PRN
  Administered 2021-07-23: 130 mg via INTRAVENOUS

## 2021-07-23 MED ORDER — HYDROMORPHONE 1 MG/ML IV SOLN
INTRAVENOUS | Status: DC
  Administered 2021-07-23: 30 mg via INTRAVENOUS
  Administered 2021-07-23: 3 mg via INTRAVENOUS
  Administered 2021-07-24: 2.6 mg via INTRAVENOUS
  Administered 2021-07-24: 2.7 mg via INTRAVENOUS
  Administered 2021-07-24: 0.9 mg via INTRAVENOUS
  Filled 2021-07-23: qty 30

## 2021-07-23 MED ORDER — HYDRALAZINE HCL 20 MG/ML IJ SOLN: 10.0000 mg | Freq: Four times a day (QID) | INTRAMUSCULAR | Status: DC | PRN

## 2021-07-23 MED ORDER — ORAL CARE MOUTH RINSE: 15.0000 mL | Freq: Once | OROMUCOSAL | Status: AC

## 2021-07-23 MED ORDER — ONDANSETRON HCL 4 MG/2ML IJ SOLN
INTRAMUSCULAR | Status: DC | PRN
  Administered 2021-07-23: 4 mg via INTRAVENOUS

## 2021-07-23 MED ORDER — DIPHENHYDRAMINE HCL 50 MG/ML IJ SOLN: 12.5000 mg | Freq: Four times a day (QID) | INTRAMUSCULAR | Status: DC | PRN

## 2021-07-23 MED ORDER — CHLORHEXIDINE GLUCONATE 0.12 % MT SOLN
OROMUCOSAL | Status: AC
  Administered 2021-07-23: 14:00:00 15 mL via OROMUCOSAL
  Filled 2021-07-23: qty 15

## 2021-07-23 MED ORDER — KETAMINE HCL-SODIUM CHLORIDE 100-0.9 MG/10ML-% IV SOSY
PREFILLED_SYRINGE | INTRAVENOUS | Status: DC | PRN
  Administered 2021-07-23: 10 mg via INTRAVENOUS
  Administered 2021-07-23: 20 mg via INTRAVENOUS
  Administered 2021-07-23: 10 mg via INTRAVENOUS

## 2021-07-23 MED ORDER — ARTIFICIAL TEARS OPHTHALMIC OINT
TOPICAL_OINTMENT | OPHTHALMIC | Status: AC
  Filled 2021-07-23: qty 3.5

## 2021-07-23 MED ORDER — FENTANYL CITRATE (PF) 250 MCG/5ML IJ SOLN
INTRAMUSCULAR | Status: DC | PRN
  Administered 2021-07-23 (×5): 50 ug via INTRAVENOUS

## 2021-07-23 MED ORDER — ONDANSETRON HCL 4 MG/2ML IJ SOLN
4.0000 mg | Freq: Four times a day (QID) | INTRAMUSCULAR | Status: DC | PRN
  Filled 2021-07-23: qty 2

## 2021-07-23 MED ORDER — KETAMINE HCL 50 MG/5ML IJ SOSY
PREFILLED_SYRINGE | INTRAMUSCULAR | Status: AC
  Filled 2021-07-23: qty 5

## 2021-07-23 MED ORDER — FENTANYL CITRATE (PF) 250 MCG/5ML IJ SOLN
INTRAMUSCULAR | Status: AC
  Filled 2021-07-23: qty 5

## 2021-07-23 MED ORDER — ONDANSETRON HCL 4 MG/2ML IJ SOLN
4.0000 mg | Freq: Four times a day (QID) | INTRAMUSCULAR | Status: DC | PRN
  Administered 2021-07-25: 4 mg via INTRAVENOUS
  Filled 2021-07-23: qty 2

## 2021-07-23 MED ORDER — OXYCODONE HCL 5 MG/5ML PO SOLN: 5.0000 mg | Freq: Once | ORAL | Status: DC | PRN

## 2021-07-23 MED ORDER — OXYCODONE HCL 5 MG PO TABS: 5.0000 mg | ORAL_TABLET | Freq: Once | ORAL | Status: DC | PRN

## 2021-07-23 MED ORDER — SODIUM CHLORIDE 0.9% FLUSH: 9.0000 mL | INTRAVENOUS | Status: DC | PRN

## 2021-07-23 MED ORDER — ACETAMINOPHEN 160 MG/5ML PO SOLN: 1000.0000 mg | Freq: Four times a day (QID) | ORAL | Status: DC

## 2021-07-23 MED ORDER — SUGAMMADEX SODIUM 200 MG/2ML IV SOLN
INTRAVENOUS | Status: DC | PRN
  Administered 2021-07-23: 250 mg via INTRAVENOUS

## 2021-07-23 MED ORDER — KETOROLAC TROMETHAMINE 15 MG/ML IJ SOLN
15.0000 mg | Freq: Four times a day (QID) | INTRAMUSCULAR | Status: AC
  Administered 2021-07-23 – 2021-07-25 (×7): 15 mg via INTRAVENOUS
  Filled 2021-07-23 (×8): qty 1

## 2021-07-23 MED ORDER — CHLORHEXIDINE GLUCONATE CLOTH 2 % EX PADS
6.0000 | MEDICATED_PAD | Freq: Every day | CUTANEOUS | Status: DC
  Administered 2021-07-23 – 2021-07-28 (×4): 6 via TOPICAL

## 2021-07-23 MED ORDER — DEXAMETHASONE SODIUM PHOSPHATE 10 MG/ML IJ SOLN
INTRAMUSCULAR | Status: DC | PRN
  Administered 2021-07-23: 10 mg via INTRAVENOUS

## 2021-07-23 MED ORDER — ONDANSETRON HCL 4 MG/2ML IJ SOLN: 4.0000 mg | Freq: Four times a day (QID) | INTRAMUSCULAR | Status: DC | PRN

## 2021-07-23 MED ORDER — LIDOCAINE 2% (20 MG/ML) 5 ML SYRINGE
INTRAMUSCULAR | Status: DC | PRN
  Administered 2021-07-23: 60 mg via INTRAVENOUS

## 2021-07-23 MED ORDER — DEXTROSE-NACL 5-0.9 % IV SOLN: INTRAVENOUS | Status: DC

## 2021-07-23 MED ORDER — ACETAMINOPHEN 500 MG PO TABS
1000.0000 mg | ORAL_TABLET | Freq: Four times a day (QID) | ORAL | Status: DC
  Administered 2021-07-23 – 2021-07-25 (×7): 1000 mg via ORAL
  Filled 2021-07-23 (×7): qty 2

## 2021-07-23 MED ORDER — ENOXAPARIN SODIUM 40 MG/0.4ML IJ SOSY
40.0000 mg | PREFILLED_SYRINGE | Freq: Every day | INTRAMUSCULAR | Status: DC
  Administered 2021-07-24 – 2021-07-28 (×5): 40 mg via SUBCUTANEOUS
  Filled 2021-07-23 (×5): qty 0.4

## 2021-07-23 MED ORDER — NALOXONE HCL 0.4 MG/ML IJ SOLN
0.4000 mg | INTRAMUSCULAR | Status: DC | PRN
  Filled 2021-07-23: qty 1

## 2021-07-23 MED ORDER — DIPHENHYDRAMINE HCL 12.5 MG/5ML PO ELIX: 12.5000 mg | ORAL_SOLUTION | Freq: Four times a day (QID) | ORAL | Status: DC | PRN

## 2021-07-23 MED ORDER — ROCURONIUM BROMIDE 10 MG/ML (PF) SYRINGE
PREFILLED_SYRINGE | INTRAVENOUS | Status: AC
  Filled 2021-07-23: qty 10

## 2021-07-23 MED ORDER — FENTANYL CITRATE (PF) 100 MCG/2ML IJ SOLN: 25.0000 ug | INTRAMUSCULAR | Status: DC | PRN

## 2021-07-23 MED ORDER — 0.9 % SODIUM CHLORIDE (POUR BTL) OPTIME
TOPICAL | Status: DC | PRN
  Administered 2021-07-23: 14:00:00 2000 mL

## 2021-07-23 MED ORDER — ROCURONIUM BROMIDE 10 MG/ML (PF) SYRINGE
PREFILLED_SYRINGE | INTRAVENOUS | Status: DC | PRN
  Administered 2021-07-23: 20 mg via INTRAVENOUS
  Administered 2021-07-23: 10 mg via INTRAVENOUS
  Administered 2021-07-23: 20 mg via INTRAVENOUS
  Administered 2021-07-23: 60 mg via INTRAVENOUS
  Administered 2021-07-23: 10 mg via INTRAVENOUS

## 2021-07-23 MED ORDER — TOLVAPTAN 15 MG PO TABS
15.0000 mg | ORAL_TABLET | Freq: Once | ORAL | Status: AC
  Administered 2021-07-23: 11:00:00 15 mg via ORAL
  Filled 2021-07-23: qty 1

## 2021-07-23 MED ORDER — MIDAZOLAM HCL 5 MG/5ML IJ SOLN
INTRAMUSCULAR | Status: DC | PRN
Start: 2021-07-23 — End: 2021-07-23
  Administered 2021-07-23 (×2): 1 mg via INTRAVENOUS

## 2021-07-23 MED ORDER — INSULIN ASPART 100 UNIT/ML IJ SOLN
0.0000 [IU] | INTRAMUSCULAR | Status: DC
  Administered 2021-07-23 – 2021-07-24 (×3): 2 [IU] via SUBCUTANEOUS

## 2021-07-23 MED ORDER — DEXAMETHASONE SODIUM PHOSPHATE 10 MG/ML IJ SOLN
INTRAMUSCULAR | Status: AC
  Filled 2021-07-23: qty 1

## 2021-07-23 MED ORDER — ONDANSETRON HCL 4 MG/2ML IJ SOLN
INTRAMUSCULAR | Status: AC
  Filled 2021-07-23: qty 2

## 2021-07-23 MED ORDER — PHENYLEPHRINE HCL-NACL 20-0.9 MG/250ML-% IV SOLN
INTRAVENOUS | Status: DC | PRN
  Administered 2021-07-23: 10 ug/min via INTRAVENOUS

## 2021-07-23 MED ORDER — CHLORHEXIDINE GLUCONATE 0.12 % MT SOLN: 15.0000 mL | Freq: Once | OROMUCOSAL | Status: AC

## 2021-07-23 SURGICAL SUPPLY — 77 items
ADH SKN CLS APL DERMABOND .7 (GAUZE/BANDAGES/DRESSINGS) ×1
APL SRG 22X2 LUM MLBL SLNT (VASCULAR PRODUCTS)
APL SRG 7X2 LUM MLBL SLNT (VASCULAR PRODUCTS)
APPLICATOR TIP COSEAL (VASCULAR PRODUCTS) IMPLANT
APPLICATOR TIP EXT COSEAL (VASCULAR PRODUCTS) IMPLANT
BLADE CLIPPER SURG (BLADE) ×2 IMPLANT
CANISTER SUCT 3000ML PPV (MISCELLANEOUS) ×2 IMPLANT
CATH KIT ON-Q SILVERSOAK 5 (CATHETERS) IMPLANT
CATH KIT ON-Q SILVERSOAK 5IN (CATHETERS) IMPLANT
CATH THORACIC 28FR (CATHETERS) IMPLANT
CATH THORACIC 36FR (CATHETERS) IMPLANT
CATH THORACIC 36FR RT ANG (CATHETERS) IMPLANT
CLEANER TIP ELECTROSURG 2X2 (MISCELLANEOUS) ×1 IMPLANT
CLIP VESOCCLUDE MED 6/CT (CLIP) IMPLANT
CNTNR URN SCR LID CUP LEK RST (MISCELLANEOUS) ×2 IMPLANT
CONN ST 1/4X3/8  BEN (MISCELLANEOUS) ×4
CONN ST 1/4X3/8 BEN (MISCELLANEOUS) IMPLANT
CONN Y 3/8X3/8X3/8  BEN (MISCELLANEOUS) ×2
CONN Y 3/8X3/8X3/8 BEN (MISCELLANEOUS) IMPLANT
CONT SPEC 4OZ STRL OR WHT (MISCELLANEOUS) ×6
DERMABOND ADVANCED (GAUZE/BANDAGES/DRESSINGS) ×1
DERMABOND ADVANCED .7 DNX12 (GAUZE/BANDAGES/DRESSINGS) IMPLANT
DRAIN CHANNEL 32F RND 10.7 FF (WOUND CARE) ×2 IMPLANT
DRAPE INCISE IOBAN 66X45 STRL (DRAPES) ×1 IMPLANT
DRAPE UNIVERSAL PACK (DRAPES) ×1 IMPLANT
DRAPE WARM FLUID 44X44 (DRAPES) ×2 IMPLANT
ELECT REM PT RETURN 9FT ADLT (ELECTROSURGICAL) ×2
ELECTRODE REM PT RTRN 9FT ADLT (ELECTROSURGICAL) ×1 IMPLANT
GAUZE SPONGE 4X4 12PLY STRL (GAUZE/BANDAGES/DRESSINGS) ×2 IMPLANT
GLOVE SURG ENC MOIS LTX SZ6.5 (GLOVE) ×4 IMPLANT
GLOVE SURG MICRO LTX SZ7 (GLOVE) ×4 IMPLANT
GOWN STRL REUS W/ TWL LRG LVL3 (GOWN DISPOSABLE) ×4 IMPLANT
GOWN STRL REUS W/ TWL XL LVL3 (GOWN DISPOSABLE) ×1 IMPLANT
GOWN STRL REUS W/TWL LRG LVL3 (GOWN DISPOSABLE) ×8
GOWN STRL REUS W/TWL XL LVL3 (GOWN DISPOSABLE) ×2
HANDLE STAPLE ENDO GIA SHORT (STAPLE)
KIT BASIN OR (CUSTOM PROCEDURE TRAY) ×2 IMPLANT
KIT SUCTION CATH 14FR (SUCTIONS) ×2 IMPLANT
KIT TURNOVER KIT B (KITS) ×2 IMPLANT
NS IRRIG 1000ML POUR BTL (IV SOLUTION) ×8 IMPLANT
PACK CHEST (CUSTOM PROCEDURE TRAY) ×2 IMPLANT
PAD ARMBOARD 7.5X6 YLW CONV (MISCELLANEOUS) ×4 IMPLANT
PENCIL BUTTON HOLSTER BLD 10FT (ELECTRODE) ×1 IMPLANT
SEALANT SURG COSEAL 4ML (VASCULAR PRODUCTS) IMPLANT
SEALANT SURG COSEAL 8ML (VASCULAR PRODUCTS) IMPLANT
SOL ANTI FOG 6CC (MISCELLANEOUS) IMPLANT
SOLUTION ANTI FOG 6CC (MISCELLANEOUS)
SPONGE INTESTINAL PEANUT (DISPOSABLE) ×4 IMPLANT
STAPLER ENDO GIA 12 SHRT THIN (STAPLE) IMPLANT
STAPLER ENDO GIA 12MM SHORT (STAPLE) IMPLANT
SUT PROLENE 3 0 SH DA (SUTURE) IMPLANT
SUT PROLENE 4 0 RB 1 (SUTURE)
SUT PROLENE 4-0 RB1 .5 CRCL 36 (SUTURE) IMPLANT
SUT SILK  1 MH (SUTURE) ×6
SUT SILK 1 MH (SUTURE) ×2 IMPLANT
SUT SILK 1 TIES 10X30 (SUTURE) IMPLANT
SUT SILK 2 0SH CR/8 30 (SUTURE) IMPLANT
SUT SILK 3 0SH CR/8 30 (SUTURE) IMPLANT
SUT VIC AB 1 CTX 18 (SUTURE) IMPLANT
SUT VIC AB 1 CTX 36 (SUTURE) ×4
SUT VIC AB 1 CTX36XBRD ANBCTR (SUTURE) IMPLANT
SUT VIC AB 2-0 CTX 36 (SUTURE) ×2 IMPLANT
SUT VIC AB 2-0 UR6 27 (SUTURE) IMPLANT
SUT VIC AB 3-0 MH 27 (SUTURE) IMPLANT
SUT VIC AB 3-0 X1 27 (SUTURE) ×2 IMPLANT
SUT VICRYL 2 TP 1 (SUTURE) ×2 IMPLANT
SWAB COLLECTION DEVICE MRSA (MISCELLANEOUS) IMPLANT
SWAB CULTURE ESWAB REG 1ML (MISCELLANEOUS) IMPLANT
SYSTEM SAHARA CHEST DRAIN ATS (WOUND CARE) ×2 IMPLANT
TAPE CLOTH 4X10 WHT NS (GAUZE/BANDAGES/DRESSINGS) ×2 IMPLANT
TAPE CLOTH SURG 4X10 WHT LF (GAUZE/BANDAGES/DRESSINGS) ×1 IMPLANT
TIP APPLICATOR SPRAY EXTEND 16 (VASCULAR PRODUCTS) IMPLANT
TOWEL GREEN STERILE (TOWEL DISPOSABLE) ×2 IMPLANT
TOWEL GREEN STERILE FF (TOWEL DISPOSABLE) ×2 IMPLANT
TRAP SPECIMEN MUCUS 40CC (MISCELLANEOUS) IMPLANT
TRAY FOLEY MTR SLVR 14FR STAT (SET/KITS/TRAYS/PACK) ×2 IMPLANT
WATER STERILE IRR 1000ML POUR (IV SOLUTION) ×4 IMPLANT

## 2021-07-23 NOTE — Progress Notes (Signed)
NAME:  Victor [name], MRN:  [id number], DOB:  December 10, 1975, LOS: 11 ADMISSION DATE:  07/11/2021, CONSULTATION DATE:  07/12/2021 REFERRING MD:  Dr. Cyndia Bent, CHIEF COMPLAINT:  empyema, intubated post-VATs  History of Present Illness:  45 yo male smoker developed pain in his left chest on 07/06/21.  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  He then developed cough with rust colored sputum, chills, sweats, and shakes.  Appetite has decreased.  Has pleuritic pain on left side causing splinting with respiration.  In ER he had SpO2 86% on room air.  CT chest showed LLL infiltrate and small effusion.  He was started on antibiotics, supplemental oxygen and solumedrol.  PCCM asked to assist with management.  He underwent VATS decortication for nonresolving empyema on 11/28, was extubated and persistently hypoxemic. Was transferred to the ICU for ongoing management. PCCM consulted to assist with respiratory failure  Pertinent  Medical History  GERD, HTN, Stomach ulcer  Significant Hospital Events: Including procedures, antibiotic start and stop dates in addition to other pertinent events   11/16 admit 11/17 add solumedrol for pleurisy> d/c'd 11/21  11/24 Left pigtail placement per IR, ceftriaxone changed to unasyn/ levaquin 11/28 left sided vats decortication  Studies:  CT angio chest 07/11/21 >> LLL infiltrate, small Lt pleural effusion U/S chest 07/12/21 >> small, complex effusion Urinary strep 11/16 neg Urinary legionella 11/16 neg  MRSA PCR screen 11/17 neg  CT with contrast 11/23>>>moderate left pleural effusion with adjacent consolidation, slightly worse from prior CT on 11/16, new trace pericardial effusion   Interim History / Subjective:  This evening awake, following commands, but somnolent.   Objective   Blood pressure (!) 142/91, pulse 80, temperature 98.2 F (36.8 C), temperature source Oral, resp. rate 18, height 5\' 11"  (1.803 m), weight  113.2 kg, SpO2 95 %.        Intake/Output Summary (Last 24 hours) at 07/23/2021 1646 Last data filed at 07/23/2021 1624 Gross per 24 hour  Intake 1110 ml  Output 1875 ml  Net -765 ml    Filed Weights   07/11/21 2058 07/12/21 0515  Weight: 113.9 kg 113.2 kg   Examination: General:  somnolent, on non-rebreather HEENT: mmm Neuro:  somnolent but awakens to verbal stimuli, follows commands, no focal asymmetry CV: RRR no mrg PULM:  diminished left breath sounds. No wheezing. Left sided chest tube to waterseal, no air leak. GI: obese, soft, bs+ Extremities: warm/dry, no LE edema   Pleural fluid studies reviewed - exudative with neutrophil predominance. Pleural fluid cultures, Glucose and pH not sent. Cytology negative for malignancy.   Resolved Hospital Problem list     Assessment & Plan:  Victor [name] is a 45 y.o. man with COPD who presents with:  Acute hypoxic respiratory failure secondary to CAP Bacterial community acquired pneumonia with lobar consolidation of LLL. Complicated Left Pleural effusion  - effusion worsening and refractory to chest tube placement on 11/24 with TPA/DNAse - POD 0 left sided VATS with decortication of empyema.  - returned from [place] extubated but sats are 91% on non-rebreather. ABG pending. Suspect component of hypoventilation post-operatively due to pain/splinting.  - on dilaudid drip for pain control. Will need very close monitoring of respiratory status and is at risk for intubation.  - continue xopenex, pulmicort/ brovana nebs - continue merrem. Follow cultures from OR.   Rest per primary team. PCCM will continue to follow for respiratory/cc needs.    Labs  CMP Latest Ref Rng & Units 07/23/2021 07/22/2021 07/21/2021  Glucose 70 - 99 mg/dL 104(H) 125(H) 118(H)  BUN 6 - 20 mg/dL 16 17 19   Creatinine 0.61 - 1.24 mg/dL 0.88 0.94 0.92  Sodium 135 - 145 mmol/L 129(L) 132(L) 132(L)  Potassium 3.5 - 5.1 mmol/L 3.5 4.7 4.2  Chloride 98 - 111  mmol/L 92(L) 96(L) 95(L)  CO2 22 - 32 mmol/L 30 28 28   Calcium 8.9 - 10.3 mg/dL 7.5(L) 7.9(L) 8.1(L)  Total Protein 6.5 - 8.1 g/dL 5.3(L) 5.8(L) 6.0(L)  Total Bilirubin 0.3 - 1.2 mg/dL 0.4 0.9 0.8  Alkaline Phos 38 - 126 U/L 116 136(H) 164(H)  AST 15 - 41 U/L 13(L) 17 34  ALT 0 - 44 U/L 43 68(H) 106(H)    CBC Latest Ref Rng & Units 07/23/2021 07/22/2021 07/21/2021  WBC 4.0 - 10.5 K/uL 19.4(H) 25.6(H) 21.9(H)  Hemoglobin 13.0 - 17.0 g/dL 12.2(L) 14.9 15.3  Hematocrit 39.0 - 52.0 % 37.2(L) 43.9 46.2  Platelets 150 - 400 K/uL 398 454(H) 378   The patient is critically ill due to respiratory failure.  Critical care was necessary to treat or prevent imminent or life-threatening deterioration.  Critical care was time spent personally by me on the following activities: development of treatment plan with patient and/or surrogate as well as nursing, discussions with consultants, evaluation of patient's response to treatment, examination of patient, obtaining history from patient or surrogate, ordering and performing treatments and interventions, ordering and review of laboratory studies, ordering and review of radiographic studies, pulse oximetry, re-evaluation of patient's condition and participation in multidisciplinary rounds.   Critical Care Time devoted to patient care services described in this note is 42 minutes. This time reflects time of care of this [place] . This critical care time does not reflect separately billable procedures or procedure time, teaching time or supervisory time of PA/NP/Med student/Med Resident etc but could involve care discussion time.       Spero Geralds [facility] 07/23/2021 5:03 PM  Pager: see AMION  If no response to pager , please call critical care on call (see AMION) until 7pm After 7:00 pm call Elink

## 2021-07-23 NOTE — Anesthesia Preprocedure Evaluation (Signed)
Anesthesia Evaluation  Patient identified by MRN, date of birth, ID band Patient awake    Reviewed: Allergy & Precautions, H&P , NPO status , Patient's Chart, lab work & pertinent test results  Airway Mallampati: II   Neck ROM: full    Dental   Pulmonary Current Smoker,  Left side empyema    breath sounds clear to auscultation       Cardiovascular hypertension,  Rhythm:regular Rate:Normal     Neuro/Psych    GI/Hepatic GERD  ,  Endo/Other    Renal/GU      Musculoskeletal  (+) Arthritis ,   Abdominal   Peds  Hematology   Anesthesia Other Findings   Reproductive/Obstetrics                             Anesthesia Physical Anesthesia Plan  ASA: 3  Anesthesia Plan: General   Post-op Pain Management:    Induction: Intravenous  PONV Risk Score and Plan: 1 and Ondansetron, Dexamethasone, Midazolam and Treatment [place] vary due to age or medical condition  Airway Management Planned: Oral ETT and Double Lumen EBT  Additional Equipment: Arterial line  Intra-op Plan:   Post-operative Plan: Extubation in OR and Possible Post-op intubation/ventilation  Informed Consent: I have reviewed the patients History and Physical, chart, labs and discussed the procedure including the risks, benefits and alternatives for the proposed anesthesia with the patient or authorized representative who has indicated his/her understanding and acceptance.     Dental advisory given  Plan Discussed with: CRNA, Anesthesiologist and Surgeon  Anesthesia Plan Comments:         Anesthesia Quick Evaluation

## 2021-07-23 NOTE — Progress Notes (Signed)
PT Cancellation Note  Patient Details Name: Aarit Kashuba Shuford MRN: [id number] DOB: [birth year]   Cancelled Treatment:    Reason Eval/Treat Not Completed: Patient at procedure or test/unavailable (OR for surgical debridement and left thoracotomy).  Wyona Almas, PT, DPT Acute Rehabilitation Services Pager [phone] Office [phone]    Deno Etienne 07/23/2021, 1:28 PM

## 2021-07-23 NOTE — Progress Notes (Signed)
Pt is scheduled VATS procedure today. Pt received Lovenox this morning.  Dr Cyndia Bent and Anesthesia notified.

## 2021-07-23 NOTE — Progress Notes (Signed)
      [address]       [place] [address]             [phone]      S/p decortication  C/o pain  BP (!) 115/57   Pulse 89   Temp 98.6 F (37 C)   Resp 15   Ht 5\' 11"  (1.803 m)   Wt 113.2 kg   SpO2 94%   BMI 34.81 kg/m   Intake/Output Summary (Last 24 hours) at 07/23/2021 1916 Last data filed at 07/23/2021 1854 Gross per 24 hour  Intake 1110 ml  Output 2245 ml  Net -1135 ml   Stable early postop Dilaudid PCA ordered  Remo Lipps C. Roxan Hockey, MD [facility] [phone]

## 2021-07-23 NOTE — Progress Notes (Addendum)
PROGRESS NOTE                                                                                                                                                                                                             Patient Demographics:    Victor [name], is a 45 y.o. male, DOB - 1976-08-10, MPN:[id number]  Outpatient Primary MD for the patient is [name], DO    LOS - 19  Admit date - 07/11/2021    Chief Complaint  Patient presents with   Shortness of Breath       Brief Narrative (HPI from H&P)   45 year old male with a history of hypertension, hyperlipidemia, tobacco abuse, GERD, chronic knee pain status post right TKA and multiple revisions, on home narcotics and as needed Narcan, he presented to the [facility] with 1 week history of  cough shortness of breath along with left-sided pleuritic chest pain, he was diagnosed with pneumonia and admitted to the hospital on 07/11/2021, by that time he had symptoms for 1 week already.  Despite appropriate treatment he continued to have left therapeutic chest pain CT scan demonstrated moderate to large left-sided pleural effusion around the infiltrate suspicious for parapneumonic effusion versus empyema he was transferred to [facility] for further care.   Subjective:   Patient in bed, appears comfortable, denies any headache, no fever, +ve L. Chest pain,  no abdominal pain. No new focal weakness.   Assessment  & Plan :    1. Acute Hypoxic Resp. Failure due to CAP with L. Sided infiltrate and surrounding moderate to large effusion - no history of travel or sick contact exposure, he does take some narcotics at home for chronic knee pain, question if he at some point had some microaspiration which she does not recall, has been treated with empiric IV antibiotics for a week without much improvement. He is currently on Meropenem,  underwent left pigtail catheter  placement by IR on 07/19/2021, follow culture results, fluid appears parapneumonic versus empyema, despite appropriate treatment clinically not improving as expected, repeat CT suggest the same.  Cardiothoracic surgery taking the patient to the OR later on 07/23/2021 for surgical debridement and left thoracotomy.  2.  Multiple right knee surgeries with chronic pain.  Supportive care.  On narcotics at home.  3.  Hypertension.  Placed on  Coreg will monitor.  4.  Smoking.  Counseled to quit smoking has 30-pack-year history.  5.  Incidental finding of fatty liver with obesity.  BMI of 34.  Follow with PCP for weight loss outpatient GI follow-up to be arranged by PCP.  6.  Trace pericardial effusion noted on CT scan.  Symptom-free with stable echocardiogram preserved EF and no pericardial effusion on echo.  7. Hyponatremia.  Set up for SIADH, currently n.p.o. hence IV fluids, urine and serum osmolality along with urine electrolytes ordered.  Will monitor.      Condition - Fair  Family Communication  :  Family bedside on 07/19/2021, 07/20/2021, 07/22/2019, 07/22/21 Code Status :  Full  Consults  :  PCCM, cardiothoracic surgery  PUD Prophylaxis :    Procedures  :     CT 07/22/21 -  1. Interval placement of pigtail drainage catheter in large loculated left pleural effusion. This effusion appears to of increased slightly in size compared to the prior examination, with worsening atelectasis in the left lung. 2. New area of atelectasis and some airspace consolidation in the right lower lobe, concerning for developing pneumonia or sequela of aspiration. 3. Aortic atherosclerosis, in addition to left anterior descending coronary artery disease. Please note that although the presence of coronary artery calcium documents the presence of coronary artery disease, the severity of this disease and any potential stenosis cannot be assessed on this non-gated CT examination. Assessment for potential risk factor  modification, dietary therapy or pharmacologic therapy [place] be warranted, if clinically indicated. Aortic Atherosclerosis.  CT 07/18/21 - 1. Moderate size left pleural effusion with adjacent consolidation concerning for pneumonia, slightly worsened since the prior CT chest from 07/11/2021. Recommend continued imaging follow-up to resolution. 2. New trace pericardial effusion.  Right upper quadrant ultrasound.  Gallstones and fatty liver  L. Lung Pigtail catheter placed by IR 07/19/21  Echocardiogram  1. Left ventricular ejection fraction, by estimation, is 65 to 70%. The left ventricle has normal function. The left ventricle has no regional wall motion abnormalities. There is mild left ventricular hypertrophy. Left ventricular diastolic parameters were normal.  2. Right ventricular systolic function is normal. The right ventricular size is normal. Tricuspid regurgitation signal is inadequate for assessing PA pressure.  3. The mitral valve is normal in structure. No evidence of mitral valve regurgitation.  4. The aortic valve was not well visualized. Aortic valve regurgitation is not visualized. No aortic stenosis is present.      Disposition Plan  :    Status is: Inpatient  Remains inpatient appropriate because: Evaluation of left parapneumonic effusion/empyema Losee require pigtail catheter placement.  DVT Prophylaxis  :  Lovenox  Place TED hose Start: 07/17/21 1214 SCDs Start: 07/12/21 0443    Lab Results  Component Value Date   PLT 398 07/23/2021    Diet :  Diet Order             Diet NPO time specified  Diet effective midnight                    Inpatient Medications  Scheduled Meds:  arformoterol  15 mcg Nebulization BID   budesonide (PULMICORT) nebulizer solution  0.5 mg Nebulization BID   calcium carbonate  1 tablet Oral Q breakfast   carvedilol  6.25 mg Oral BID WC   Chlorhexidine Gluconate Cloth  6 each Topical Daily   dextromethorphan-guaiFENesin  1 tablet  Oral BID   docusate sodium  100 mg Oral BID  enoxaparin (LOVENOX) injection  60 mg Subcutaneous Daily   lidocaine  1 patch Transdermal Q24H   nicotine  21 mg Transdermal Daily   nystatin  5 mL Oral QID   pantoprazole  40 mg Oral BID   sodium chloride flush  10 mL Other Q8H   Continuous Infusions:  lactated ringers     meropenem (MERREM) IV 1 g (07/23/21 0441)   PRN Meds:.acetaminophen **OR** acetaminophen, acetaminophen, alum & mag hydroxide-simeth, hydrALAZINE, HYDROmorphone (DILAUDID) injection, levalbuterol, methocarbamol, oxyCODONE, sodium chloride  Time Spent in minutes  30   Lala Lund M.D on 07/23/2021 at 8:42 AM  To page go to [email or web address]   [facility] -  Office  [phone]  See all Orders from today for further details    Objective:   Vitals:   07/23/21 0426 07/23/21 0725 07/23/21 0746 07/23/21 0747  BP: 136/75 126/75    Pulse: 84 84    Resp: 17 20    Temp: 98.4 F (36.9 C) 98.2 F (36.8 C)    TempSrc: Oral Oral    SpO2: 91% 91% 92% 92%  Weight:      Height:        Wt Readings from Last 3 Encounters:  07/12/21 113.2 kg  07/02/21 113.9 kg  03/29/21 113 kg     Intake/Output Summary (Last 24 hours) at 07/23/2021 0842 Last data filed at 07/23/2021 0700 Gross per 24 hour  Intake 10 ml  Output 1025 ml  Net -1015 ml     Physical Exam  Awake Alert, No new F.N deficits, Normal affect [place].AT,PERRAL Supple Neck, No JVD,   Symmetrical Chest wall movement, reduced left basilar breath sounds with left pigtail catheter in place RRR,No Gallops, Rubs or new Murmurs,  +ve B.Sounds, Abd Soft, No tenderness,   No Cyanosis, Clubbing or edema     Data Review:    CBC Recent Labs  Lab 07/19/21 0127 07/20/21 0101 07/21/21 0106 07/22/21 0410 07/23/21 0105  WBC 15.1* 20.9* 21.9* 25.6* 19.4*  HGB 13.4 15.3 15.3 14.9 12.2*  HCT 41.0 48.0 46.2 43.9 37.2*  PLT 384 425* 378 454* 398  MCV 88.7 89.7 88.0 86.9 88.4  MCH 29.0 28.6 29.1 29.5  29.0  MCHC 32.7 31.9 33.1 33.9 32.8  RDW 14.6 14.6 14.4 14.4 14.4  LYMPHSABS  --  2.9 2.7 1.0 2.7  MONOABS  --  1.3* 2.2* 2.6* 2.3*  EOSABS  --  0.2 0.2 0.0 0.1  BASOSABS  --  0.0 0.1 0.0 0.1    Electrolytes Recent Labs  Lab 07/19/21 0127 07/19/21 0746 07/20/21 0101 07/21/21 0106 07/22/21 0107 07/23/21 0105  NA 136  --  132* 132* 132* 129*  K 3.9  --  5.1 4.2 4.7 3.5  CL 99  --  98 95* 96* 92*  CO2 29  --  24 28 28 30   GLUCOSE 119*  --  110* 118* 125* 104*  BUN 12  --  17 19 17 16   CREATININE 0.85  --  0.92 0.92 0.94 0.88  CALCIUM 8.0*  --  8.2* 8.1* 7.9* 7.5*  AST 38  --  50* 34 17 13*  ALT 179*  --  148* 106* 68* 43  ALKPHOS 168*  --  174* 164* 136* 116  BILITOT 0.4  --  0.9 0.8 0.9 0.4  ALBUMIN 2.1*  --  2.3* 2.0* 2.0* 1.7*  MG 2.3  --  2.3 2.1 2.0 2.1  CRP  --  7.7* 13.1* 18.9* 18.0* 22.5*  PROCALCITON  --  0.12 0.31 0.23 0.15 0.20  BNP  --  6.0 4.0 8.1 11.7 7.1    ------------------------------------------------------------------------------------------------------------------ No results for input(s): CHOL, HDL, LDLCALC, TRIG, CHOLHDL, LDLDIRECT in the last 72 hours.  Lab Results  Component Value Date   HGBA1C 5.4 03/03/2018    No results for input(s): TSH, T4TOTAL, T3FREE, THYROIDAB in the last 72 hours.  Invalid input(s): FREET3 ------------------------------------------------------------------------------------------------------------------ ID Labs Recent Labs  Lab 07/19/21 0127 07/19/21 0746 07/20/21 0101 07/21/21 0106 07/22/21 0107 07/22/21 0410 07/23/21 0105  WBC 15.1*  --  20.9* 21.9*  --  25.6* 19.4*  PLT 384  --  425* 378  --  454* 398  CRP  --  7.7* 13.1* 18.9* 18.0*  --  22.5*  PROCALCITON  --  0.12 0.31 0.23 0.15  --  0.20  CREATININE 0.85  --  0.92 0.92 0.94  --  0.88   Cardiac Enzymes No results for input(s): CKMB, TROPONINI, MYOGLOBIN in the last 168 hours.  Invalid input(s): CK    Radiology Reports CT CHEST W  CONTRAST  Result Date: 07/22/2021 CLINICAL DATA:  Old male with history of empyema.  Follow-up study. EXAM: CT CHEST WITH CONTRAST TECHNIQUE: Multidetector CT imaging of the chest was performed during intravenous contrast administration. CONTRAST:  66mL OMNIPAQUE IOHEXOL 300 MG/ML  SOLN COMPARISON:  Chest CT 07/18/2021. FINDINGS: Cardiovascular: Heart size is normal. There is no significant pericardial fluid, thickening or pericardial calcification. There is aortic atherosclerosis, as well as atherosclerosis of the great vessels of the mediastinum and the coronary arteries, including calcified atherosclerotic plaque in the left anterior descending coronary artery. Mediastinum/Nodes: No pathologically enlarged mediastinal or hilar lymph nodes. Esophagus is unremarkable in appearance. No axillary lymphadenopathy. Lungs/Pleura: Compared to the prior examination there has been interval placement of a small bore pigtail drainage catheter in the base of the left hemithorax. Previously noted loculated pleural fluid collection appears increased in size compared to the prior examination. Small amount of gas now present within the pleural cavity [place] be iatrogenic. Extensive passive atelectasis in the left lung, including complete atelectasis of the left lower lobe which was previously partially aerated. There is a small amount of atelectasis and probable airspace consolidation now lying dependently in the right lower lobe as well. No significant right pleural fluid. Upper Abdomen: Unremarkable. Musculoskeletal: There are no aggressive appearing lytic or blastic lesions noted in the visualized portions of the skeleton. IMPRESSION: 1. Interval placement of pigtail drainage catheter in large loculated left pleural effusion. This effusion appears to of increased slightly in size compared to the prior examination, with worsening atelectasis in the left lung. 2. New area of atelectasis and some airspace consolidation in the  right lower lobe, concerning for developing pneumonia or sequela of aspiration. 3. Aortic atherosclerosis, in addition to left anterior descending coronary artery disease. Please note that although the presence of coronary artery calcium documents the presence of coronary artery disease, the severity of this disease and any potential stenosis cannot be assessed on this non-gated CT examination. Assessment for potential risk factor modification, dietary therapy or pharmacologic therapy [place] be warranted, if clinically indicated. Aortic Atherosclerosis (ICD10-I70.0). Electronically Signed   By: Vinnie Langton M.D.   On: 07/22/2021 12:47   DG CHEST PORT 1 VIEW  Result Date: 07/21/2021 CLINICAL DATA:  Chest pain.  Follow-up left pleural effusion. EXAM: PORTABLE CHEST 1 VIEW COMPARISON:  07/20/2021 FINDINGS: Left pleural pigtail catheter remains in place. A moderate left pleural effusion is unchanged in  size. No pneumothorax visualized. Left lower lobe atelectasis is also stable. Right lung remains grossly clear. Heart size remains within normal limits. IMPRESSION: Stable moderate left pleural effusion and left lower lobe atelectasis. Electronically Signed   By: Marlaine Hind M.D.   On: 07/21/2021 08:46   DG CHEST PORT 1 VIEW  Result Date: 07/20/2021 CLINICAL DATA:  Chest tube placement EXAM: PORTABLE CHEST 1 VIEW COMPARISON:  Chest radiograph 07/19/2021 FINDINGS: The left sided pigtail catheter projects over the left base, unchanged. The cardiomediastinal silhouette is grossly stable. The moderate size left pleural effusion layering along the left chest wall is similar in size to the study from 1 day prior, though aeration of the left lung appears slightly improved. There is no pneumothorax. The right lung is clear. There is no right pleural effusion or pneumothorax. There is no acute osseous abnormality. IMPRESSION: Left chest tube in place with similar size of the left pleural effusion layering along the  chest wall but slightly improved aeration of the left lung. No pneumothorax. Electronically Signed   By: Valetta Mole M.D.   On: 07/20/2021 07:27   DG CHEST PORT 1 VIEW  Result Date: 07/19/2021 CLINICAL DATA:  Follow-up of pleural effusion.  Chest tube in place. EXAM: PORTABLE CHEST 1 VIEW COMPARISON:  07/18/2021 FINDINGS: Left-sided pleural drain/pigtail catheter. Midline trachea. Normal heart size. Loculated moderate left pleural effusion is similar. No pneumothorax. Low lung volumes with resultant pulmonary interstitial prominence. Mild subsegmental atelectasis in the medial right lung base. Increase in left lower lobe airspace disease. IMPRESSION: Left-sided pleural drain in place, with persistent loculated pleural fluid but no evidence of pneumothorax. Diminished lung volumes. Increased left base atelectasis or infection. Electronically Signed   By: Abigail Miyamoto M.D.   On: 07/19/2021 16:20   ECHOCARDIOGRAM COMPLETE  Result Date: 07/21/2021    ECHOCARDIOGRAM REPORT   Patient Name:   Victor [name] Date of Exam: 07/21/2021 Medical Rec #:  [id number]    Height:       71.0 in Accession #:    [id number]   Weight:       249.6 lb Date of Birth:  04-22-1976   BSA:          2.317 m Patient Age:    10 years     BP:           125/78 mmHg Patient Gender: M            HR:           89 bpm. Exam Location:  Inpatient Procedure: 2D Echo, Cardiac Doppler and Color Doppler Indications:    CHF  History:        Patient has no prior history of Echocardiogram examinations.                 Pleural drain in place.  Sonographer:    [name] RDCS Referring Phys: [id number] Margaree Mackintosh [place]  1. Left ventricular ejection fraction, by estimation, is 65 to 70%. The left ventricle has normal function. The left ventricle has no regional wall motion abnormalities. There is mild left ventricular hypertrophy. Left ventricular diastolic parameters were normal.  2. Right ventricular systolic function is normal. The right  ventricular size is normal. Tricuspid regurgitation signal is inadequate for assessing PA pressure.  3. The mitral valve is normal in structure. No evidence of mitral valve regurgitation.  4. The aortic valve was not well visualized. Aortic valve regurgitation is not visualized. No aortic stenosis is  present. FINDINGS  Left Ventricle: Left ventricular ejection fraction, by estimation, is 65 to 70%. The left ventricle has normal function. The left ventricle has no regional wall motion abnormalities. The left ventricular internal cavity size was normal in size. There is  mild left ventricular hypertrophy. Left ventricular diastolic parameters were normal. Right Ventricle: The right ventricular size is normal. No increase in right ventricular wall thickness. Right ventricular systolic function is normal. Tricuspid regurgitation signal is inadequate for assessing PA pressure. Left Atrium: Left atrial size was normal in size. Right Atrium: Right atrial size was normal in size. Pericardium: There is no evidence of pericardial effusion. Mitral Valve: The mitral valve is normal in structure. No evidence of mitral valve regurgitation. Tricuspid Valve: The tricuspid valve is normal in structure. Tricuspid valve regurgitation is trivial. Aortic Valve: The aortic valve was not well visualized. Aortic valve regurgitation is not visualized. No aortic stenosis is present. Pulmonic Valve: The pulmonic valve was not well visualized. Pulmonic valve regurgitation is not visualized. Aorta: The aortic root is normal in size and structure. IAS/Shunts: The interatrial septum was not well visualized.  LEFT VENTRICLE PLAX 2D LVIDd:         4.30 cm   Diastology LVIDs:         3.00 cm   LV e' medial:    6.53 cm/s LV PW:         1.10 cm   LV E/e' medial:  8.3 LV IVS:        1.10 cm   LV e' lateral:   9.25 cm/s LVOT diam:     2.30 cm   LV E/e' lateral: 5.9 LV SV:         76 LV SV Index:   33 LVOT Area:     4.15 cm  RIGHT VENTRICLE RV Basal  diam:  3.20 cm LEFT ATRIUM             Index        RIGHT ATRIUM          Index LA diam:        4.00 cm 1.73 cm/m   RA Area:     9.00 cm LA Vol (A2C):   89.7 ml 38.72 ml/m  RA Volume:   14.30 ml 6.17 ml/m LA Vol (A4C):   63.9 ml 27.58 ml/m LA Biplane Vol: 77.7 ml 33.54 ml/m  AORTIC VALVE LVOT Vmax:   121.00 cm/s LVOT Vmean:  78.600 cm/s LVOT VTI:    0.182 m  AORTA Ao Root diam: 3.40 cm MITRAL VALVE MV Area (PHT): 3.48 cm    SHUNTS MV Decel Time: 218 msec    Systemic VTI:  0.18 m MV E velocity: 54.50 cm/s  Systemic Diam: 2.30 cm MV A velocity: 70.70 cm/s MV E/A ratio:  0.77 Oswaldo Milian MD Electronically signed by Oswaldo Milian MD Signature Date/Time: 07/21/2021/5:51:59 PM    Final

## 2021-07-23 NOTE — Brief Op Note (Signed)
07/23/2021  4:47 PM  PATIENT:  Victor [name]  45 y.o. male  PRE-OPERATIVE DIAGNOSIS:  Left Empyema  POST-OPERATIVE DIAGNOSIS:  Left Empyema  PROCEDURE:  Procedure(s): THORACOTOMY with drainage of empyema (Left) DECORTICATION of left lung (Left)  SURGEON:  Surgeon(s) and Role:    * Yee Joss, Fernande Boyden, MD - Primary  PHYSICIAN ASSISTANT: Erin Barrett, PA-C   ANESTHESIA:   general  EBL:  100 mL   BLOOD ADMINISTERED:none  DRAINS: (2 31 F) Blake drain(s) in the left pleural space    LOCAL MEDICATIONS USED:  NONE  SPECIMEN:  Source of Specimen:  pleural fluid for culture, pleural peel  DISPOSITION OF SPECIMEN:   micro and pathology  COUNTS:  YES  TOURNIQUET:  * No tourniquets in log *  DICTATION: .Note written in EPIC  PLAN OF CARE: Admit to inpatient   PATIENT DISPOSITION:  PACU - hemodynamically stable.   Delay start of Pharmacological VTE agent (>24hrs) due to surgical blood loss or risk of bleeding: no

## 2021-07-23 NOTE — Transfer of Care (Signed)
Immediate Anesthesia Transfer of Care Note  Patient: Victor [name]  Procedure(s) Performed: THORACOTOMY with drainage of empyema (Left: Chest) DECORTICATION of left lung (Left: Chest)  Patient Location: PACU  Anesthesia Type:General  Level of Consciousness: drowsy and patient cooperative  Airway & Oxygen Therapy: Patient Spontanous Breathing and non-rebreather face mask  Post-op Assessment: Report given to RN and Post -op Vital signs reviewed and stable  Post vital signs: Reviewed and stable  Last Vitals:  Vitals Value Taken Time  BP 141/80   Temp    Pulse 94 07/23/21 1717  Resp 22 07/23/21 1716  SpO2 91 07/23/21 1717  Vitals shown include unvalidated device data.  Last Pain:  Vitals:   07/23/21 1335  TempSrc:   PainSc: 0-No pain      Patients Stated Pain Goal: 6 (36/43/83 7793)  Complications: No notable events documented.   Bartle at bedside

## 2021-07-23 NOTE — Plan of Care (Signed)
  Problem: Clinical Measurements: Goal: Will remain free from infection Outcome: Progressing Goal: Diagnostic test results will improve Outcome: Progressing Goal: Respiratory complications will improve Outcome: Not Progressing Goal: Cardiovascular complication will be avoided Outcome: Progressing

## 2021-07-23 NOTE — Anesthesia Procedure Notes (Signed)
Procedure Name: Intubation Date/Time: 07/23/2021 2:11 PM Performed by: Wilburn Cornelia, CRNA Pre-anesthesia Checklist: Patient identified, Emergency Drugs available, Suction available, Timeout performed and Patient being monitored Patient Re-evaluated:Patient Re-evaluated prior to induction Oxygen Delivery Method: Circle system utilized Preoxygenation: Pre-oxygenation with 100% oxygen Induction Type: IV induction Ventilation: Mask ventilation without difficulty Laryngoscope Size: Mac and 4 Grade View: Grade I Endobronchial tube: Left, Double lumen EBT, EBT position confirmed by auscultation and EBT position confirmed by fiberoptic bronchoscope and 39 Fr Number of attempts: 1 Airway Equipment and Method: Stylet Placement Confirmation: ETT inserted through vocal cords under direct vision, positive ETCO2, CO2 detector and breath sounds checked- equal and bilateral Tube secured with: Tape Dental Injury: Teeth and Oropharynx as per pre-operative assessment

## 2021-07-23 NOTE — Interval H&P Note (Signed)
History and Physical Interval Note:  07/23/2021 1:36 PM  Victor [name]  has presented today for surgery, with the diagnosis of Left Empyema.  The various methods of treatment have been discussed with the patient and family. After consideration of risks, benefits and other options for treatment, the patient has consented to  Procedure(s): VIDEO ASSISTED THORACOSCOPY (VATS)/EMPYEMA (Left) as a surgical intervention.  The patient's history has been reviewed, patient examined, no change in status, stable for surgery.  I have reviewed the patient's chart and labs.  Questions were answered to the patient's satisfaction.     Gaye Pollack

## 2021-07-23 NOTE — Anesthesia Procedure Notes (Signed)
Arterial Line Insertion Start/End11/28/2022 1:40 PM, 07/23/2021 1:45 PM Performed by: Wilburn Cornelia, CRNA, CRNA  Patient location: Pre-op. Preanesthetic checklist: patient identified, IV checked, site marked, risks and benefits discussed, surgical consent, monitors and equipment checked, pre-op evaluation, timeout performed and anesthesia consent Lidocaine 1% used for infiltration Right, radial was placed Catheter size: 20 G Hand hygiene performed  and maximum sterile barriers used  Allen's test indicative of satisfactory collateral circulation Attempts: 3 Procedure performed without using ultrasound guided technique. Following insertion, Biopatch and dressing applied. Post procedure assessment: normal  Post procedure complications: second provider assisted and unsuccessful attempts. Patient tolerated the procedure well with no immediate complications.

## 2021-07-24 ENCOUNTER — Inpatient Hospital Stay (HOSPITAL_COMMUNITY): Payer: Medicaid Other

## 2021-07-24 ENCOUNTER — Encounter (HOSPITAL_COMMUNITY): Payer: Self-pay | Admitting: Surgery

## 2021-07-24 DIAGNOSIS — J90 Pleural effusion, not elsewhere classified: Secondary | ICD-10-CM | POA: Diagnosis not present

## 2021-07-24 LAB — BASIC METABOLIC PANEL
Anion gap: 5 (ref 5–15)
BUN: 19 mg/dL (ref 6–20)
CO2: 30 mmol/L (ref 22–32)
Calcium: 7.8 mg/dL — ABNORMAL LOW (ref 8.9–10.3)
Chloride: 99 mmol/L (ref 98–111)
Creatinine, Ser: 0.9 mg/dL (ref 0.61–1.24)
GFR, Estimated: >60 mL/min (ref >60)
Glucose, Bld: 138 mg/dL — ABNORMAL HIGH (ref 70–99)
Potassium: 4.6 mmol/L (ref 3.5–5.1)
Sodium: 134 mmol/L — ABNORMAL LOW (ref 135–145)

## 2021-07-24 LAB — POCT I-STAT 7, (LYTES, BLD GAS, ICA,H+H)
Acid-Base Excess: 7 mmol/L — ABNORMAL HIGH (ref 0.0–2.0)
Bicarbonate: 33 mmol/L — ABNORMAL HIGH (ref 20.0–28.0)
Calcium, Ion: 1.12 mmol/L — ABNORMAL LOW (ref 1.15–1.40)
HCT: 36 % — ABNORMAL LOW (ref 39.0–52.0)
Hemoglobin: 12.2 g/dL — ABNORMAL LOW (ref 13.0–17.0)
O2 Saturation: 99 %
Patient temperature: 98.6
Potassium: 4.7 mmol/L (ref 3.5–5.1)
Sodium: 137 mmol/L (ref 135–145)
TCO2: 35 mmol/L — ABNORMAL HIGH (ref 22–32)
pCO2 arterial: 50.7 mmHg — ABNORMAL HIGH (ref 32.0–48.0)
pH, Arterial: 7.422 (ref 7.350–7.450)
pO2, Arterial: 145 mmHg — ABNORMAL HIGH (ref 83.0–108.0)

## 2021-07-24 LAB — CBC
HCT: 36 % — ABNORMAL LOW (ref 39.0–52.0)
Hemoglobin: 11.7 g/dL — ABNORMAL LOW (ref 13.0–17.0)
MCH: 28.7 pg (ref 26.0–34.0)
MCHC: 32.5 g/dL (ref 30.0–36.0)
MCV: 88.5 fL (ref 80.0–100.0)
Platelets: 528 10*3/uL — ABNORMAL HIGH (ref 150–400)
RBC: 4.07 MIL/uL — ABNORMAL LOW (ref 4.22–5.81)
RDW: 14.3 % (ref 11.5–15.5)
WBC: 27.4 10*3/uL — ABNORMAL HIGH (ref 4.0–10.5)
nRBC: 0 % (ref 0.0–0.2)

## 2021-07-24 LAB — PROCALCITONIN: Procalcitonin: 0.28 ng/mL

## 2021-07-24 LAB — GLUCOSE, CAPILLARY: Glucose-Capillary: 137 mg/dL — ABNORMAL HIGH (ref 70–99)

## 2021-07-24 LAB — UREA NITROGEN, URINE: Urea Nitrogen, Ur: 1402 mg/dL

## 2021-07-24 MED ORDER — OXYCODONE-ACETAMINOPHEN 7.5-325 MG PO TABS: 1.0000 | ORAL_TABLET | Freq: Four times a day (QID) | ORAL | Status: DC | PRN

## 2021-07-24 MED ORDER — HYDROMORPHONE HCL 1 MG/ML IJ SOLN
INTRAMUSCULAR | Status: AC
  Filled 2021-07-24: qty 1

## 2021-07-24 MED ORDER — OXYCODONE HCL 5 MG PO TABS
2.5000 mg | ORAL_TABLET | Freq: Four times a day (QID) | ORAL | Status: DC | PRN
  Administered 2021-07-25 – 2021-07-28 (×3): 2.5 mg via ORAL
  Filled 2021-07-24 (×3): qty 1

## 2021-07-24 MED ORDER — HYDROMORPHONE HCL 1 MG/ML IJ SOLN
1.0000 mg | INTRAMUSCULAR | Status: DC | PRN
  Administered 2021-07-24 – 2021-07-27 (×19): 1 mg via INTRAVENOUS
  Filled 2021-07-24 (×16): qty 1

## 2021-07-24 MED ORDER — SODIUM CHLORIDE 0.9 % IV SOLN: INTRAVENOUS | Status: DC | PRN

## 2021-07-24 MED ORDER — AMLODIPINE BESYLATE 5 MG PO TABS
ORAL_TABLET | ORAL | Status: AC
  Filled 2021-07-24: qty 1

## 2021-07-24 MED ORDER — AMLODIPINE BESYLATE 5 MG PO TABS
5.0000 mg | ORAL_TABLET | Freq: Every day | ORAL | Status: DC
  Administered 2021-07-24 – 2021-07-28 (×5): 5 mg via ORAL
  Filled 2021-07-24 (×4): qty 1

## 2021-07-24 MED ORDER — OXYCODONE-ACETAMINOPHEN 5-325 MG PO TABS
1.0000 | ORAL_TABLET | Freq: Four times a day (QID) | ORAL | Status: DC | PRN
  Administered 2021-07-25 – 2021-07-28 (×7): 1 via ORAL
  Filled 2021-07-24 (×8): qty 1

## 2021-07-24 NOTE — Anesthesia Postprocedure Evaluation (Signed)
Anesthesia Post Note  Patient: Victor [name]  Procedure(s) Performed: THORACOTOMY with drainage of empyema (Left: Chest) DECORTICATION of left lung (Left: Chest)     Patient location during evaluation: PACU Anesthesia Type: General Level of consciousness: awake and alert Pain management: pain level controlled Vital Signs Assessment: post-procedure vital signs reviewed and stable Respiratory status: spontaneous breathing (Pt on non-rebreather facemask) Cardiovascular status: blood pressure returned to baseline and stable Postop Assessment: no apparent nausea or vomiting Anesthetic complications: no Comments: Pt with tenuous respiratory status.  SpO2 88-90% on non-rebreather FM.  Follow ABGs to monitor progression.  ICU monitoring.   No notable events documented.  Last Vitals:  Vitals:   07/24/21 0602 07/24/21 0603  BP:    Pulse:    Resp: 14 20  Temp:    SpO2: 96% 94%    Last Pain:  Vitals:   07/24/21 0458  TempSrc:   PainSc: 2    Pain Goal: Patients Stated Pain Goal: 6 (07/23/21 0443)                 Adrian

## 2021-07-24 NOTE — Progress Notes (Addendum)
[address]       [place] [address]             [phone]      1 Day Post-Op Procedure(s) (LRB): THORACOTOMY with drainage of empyema (Left) DECORTICATION of left lung (Left)  Subjective:  Sitting up in chair.  Pain is well controlled.  Complains he is hungry, denies N/V  Objective: Vital signs in last 24 hours: Temp:  [97.7 F (36.5 C)-98.9 F (37.2 C)] 98.3 F (36.8 C) (11/29 0300) Pulse Rate:  [75-90] 89 (11/28 1815) Cardiac Rhythm: Normal sinus rhythm (11/29 0400) Resp:  [8-20] 12 (11/29 0732) BP: (115-145)/(57-99) 115/57 (11/28 1830) SpO2:  [91 %-97 %] 95 % (11/29 0732) Arterial Line BP: (88-187)/(73-106) 183/106 (11/29 0603) FiO2 (%):  [100 %] 100 % (11/29 0458) Weight:  [105.9 kg] 105.9 kg (11/29 0602)  Intake/Output from previous day: 11/28 0701 - 11/29 0700 In: 2007.3 [I.V.:2007.3] Out: 2390 [Urine:1930; Blood:100; Chest Tube:360]  General appearance: alert, cooperative, and no distress Heart: regular rate and rhythm Lungs: diminished breath sounds on left Abdomen: soft, non-tender; bowel sounds normal; no masses,  no organomegaly Extremities: extremities normal, atraumatic, no cyanosis or edema Wound: clean and dry  Lab Results: Recent Labs    07/23/21 0105 07/23/21 1709 07/24/21 0457 07/24/21 0503  WBC 19.4*  --   --  27.4*  HGB 12.2*   < > 12.2* 11.7*  HCT 37.2*   < > 36.0* 36.0*  PLT 398  --   --  528*   < > = values in this interval not displayed.   BMET:  Recent Labs    07/23/21 0105 07/23/21 1709 07/24/21 0457 07/24/21 0503  NA 129*   < > 137 134*  K 3.5   < > 4.7 4.6  CL 92*  --   --  99  CO2 30  --   --  30  GLUCOSE 104*  --   --  138*  BUN 16  --   --  19  CREATININE 0.88  --   --  0.90  CALCIUM 7.5*  --   --  7.8*   < > = values in this interval not displayed.    PT/INR: No results for input(s): LABPROT, INR in the last 72 hours. ABG    Component Value Date/Time   PHART 7.422 07/24/2021 0457    HCO3 33.0 (H) 07/24/2021 0457   TCO2 35 (H) 07/24/2021 0457   O2SAT 99.0 07/24/2021 0457   CBG (last 3)  Recent Labs    07/23/21 2008 07/23/21 2347 07/24/21 0454  GLUCAP 139* 148* 137*    Assessment/Plan: S/P Procedure(s) (LRB): THORACOTOMY with drainage of empyema (Left) DECORTICATION of left lung (Left)  CV- NSR, H/O HTN, SBP in the 140s- will resume home Norvasc Pulm- CT on water seal, no air leak present, CT output 360 cc since surgery, on 5L O2 currently, continue IS, wean oxygen as tolerated Renal- creatinine normal at 0.90 Hyponatremia- mild at 134, received Tovalptan yesterday, will monitor GI- denies N/V... hungry, will start full liquid diet this morning if no issues, will transition to regular diet at lunch time ID- afebrile, + leukocytosis.Marland Kitchen on ABX, OR cultures are pending Dispo- patient stable, chest tubes to waters seal w/o air leak, leave in place today, restart home Norvasc for BP control, continue ABX, can likely transfer to 4E today   LOS: 12 days   Ellwood Handler, PA-C 07/24/2021   Chart reviewed,  patient examined, agree with above.

## 2021-07-24 NOTE — Progress Notes (Signed)
PROGRESS NOTE                                                                                                                                                                                                             Patient Demographics:    Victor [name], is a 45 y.o. male, DOB - 06-Feb-1976, PPJ:[id number]  Outpatient Primary MD for the patient is [name], DO    LOS - 12  Admit date - 07/11/2021    Chief Complaint  Patient presents with   Shortness of Breath       Brief Narrative (HPI from H&P)   45 year old male with a history of hypertension, hyperlipidemia, tobacco abuse, GERD, chronic knee pain status post right TKA and multiple revisions, on home narcotics and as needed Narcan, he presented to the [facility] with 1 week history of  cough shortness of breath along with left-sided pleuritic chest pain, he was diagnosed with pneumonia and admitted to the hospital on 07/11/2021, by that time he had symptoms for 1 week already.  Despite appropriate treatment he continued to have left therapeutic chest pain CT scan demonstrated moderate to large left-sided pleural effusion around the infiltrate suspicious for parapneumonic effusion versus empyema he was transferred to [facility] for further care.   Subjective:   Seen sitting in recliner feels overall much better after surgery, no headache, mild pleuritic left-sided chest pain at the site of chest tube.  No abdominal pain no focal weakness.  No shortness of breath.   Assessment  & Plan :    1. Acute Hypoxic Resp. Failure due to CAP with L. Sided infiltrate and surrounding moderate to large effusion - no history of travel or sick contact exposure, he does take some narcotics at home for chronic knee pain, question if he at some point had some microaspiration which she does not recall.   He was initially admitted to [facility] and treated with  prolonged course of empiric IV antibiotics without much improvement, underwent pigtail catheter placement by pulmonary critical care again without much improvement subsequently cardiothoracic surgery was consulted and he was taken to the OR on 07/23/2021 for left thoracotomy with drainage of empyema, clinically improved on 07/24/2021 continue to follow cultures from the empyema.  Continue IV meropenem.  Clinically better although has some reactionary leukocytosis.  2.  Multiple right knee surgeries with chronic pain.  Supportive care.  On narcotics at home.  Currently on Dilaudid PCA per cardiothoracic team.  3.  Hypertension.  Placed on Coreg will monitor.  4.  Smoking.  Counseled to quit smoking has 30-pack-year history.  5.  Incidental finding of fatty liver with obesity.  BMI of 34.  Follow with PCP for weight loss outpatient GI follow-up to be arranged by PCP.  6.  Trace pericardial effusion noted on CT scan.  Symptom-free with stable echocardiogram preserved EF and no pericardial effusion on echo.  7. Hyponatremia.  SIADH much improved after single dose Sam[place]a.      Condition - Fair  Family Communication  :  Family bedside on 07/19/2021, 07/20/2021, 07/22/2019, 07/22/21 Code Status :  Full  Consults  :  PCCM, cardiothoracic surgery  PUD Prophylaxis :    Procedures  :     Left-sided thoracotomy with debridement and drainage of empyema and left chest tube placement on 07/23/2021 by Dr. Caffie Pinto.    CT 07/22/21 -  1. Interval placement of pigtail drainage catheter in large loculated left pleural effusion. This effusion appears to of increased slightly in size compared to the prior examination, with worsening atelectasis in the left lung. 2. New area of atelectasis and some airspace consolidation in the right lower lobe, concerning for developing pneumonia or sequela of aspiration. 3. Aortic atherosclerosis, in addition to left anterior descending coronary artery disease. Please note  that although the presence of coronary artery calcium documents the presence of coronary artery disease, the severity of this disease and any potential stenosis cannot be assessed on this non-gated CT examination. Assessment for potential risk factor modification, dietary therapy or pharmacologic therapy [place] be warranted, if clinically indicated. Aortic Atherosclerosis.  CT 07/18/21 - 1. Moderate size left pleural effusion with adjacent consolidation concerning for pneumonia, slightly worsened since the prior CT chest from 07/11/2021. Recommend continued imaging follow-up to resolution. 2. New trace pericardial effusion.  Right upper quadrant ultrasound.  Gallstones and fatty liver  L. Lung Pigtail catheter placed by IR 07/19/21  Echocardiogram  1. Left ventricular ejection fraction, by estimation, is 65 to 70%. The left ventricle has normal function. The left ventricle has no regional wall motion abnormalities. There is mild left ventricular hypertrophy. Left ventricular diastolic parameters were normal.  2. Right ventricular systolic function is normal. The right ventricular size is normal. Tricuspid regurgitation signal is inadequate for assessing PA pressure.  3. The mitral valve is normal in structure. No evidence of mitral valve regurgitation.  4. The aortic valve was not well visualized. Aortic valve regurgitation is not visualized. No aortic stenosis is present.      Disposition Plan  :    Status is: Inpatient  Remains inpatient appropriate because: Evaluation of left parapneumonic effusion/empyema Carducci require pigtail catheter placement.  DVT Prophylaxis  :  Lovenox  enoxaparin (LOVENOX) injection 40 mg Start: 07/24/21 0445 SCD's Start: 07/23/21 1829    Lab Results  Component Value Date   PLT 528 (H) 07/24/2021    Diet :  Diet Order             Diet full liquid Room service appropriate? Yes; Fluid consistency: Thin  Diet effective now                    Inpatient  Medications  Scheduled Meds:  acetaminophen  1,000 mg Oral Q6H   Or   acetaminophen (TYLENOL) oral liquid 160  mg/5 mL  1,000 mg Oral Q6H   amLODipine  5 mg Oral Daily   arformoterol  15 mcg Nebulization BID   bisacodyl  10 mg Oral Daily   budesonide (PULMICORT) nebulizer solution  0.5 mg Nebulization BID   Chlorhexidine Gluconate Cloth  6 each Topical Daily   dextromethorphan-guaiFENesin  1 tablet Oral BID   enoxaparin (LOVENOX) injection  40 mg Subcutaneous Daily   HYDROmorphone   Intravenous Q4H   ketorolac  15 mg Intravenous Q6H   nicotine  21 mg Transdermal Daily   nystatin  5 mL Oral QID   pantoprazole  40 mg Oral BID   senna-docusate  1 tablet Oral QHS   Continuous Infusions:  meropenem (MERREM) IV Stopped (07/24/21 0610)   PRN Meds:.diphenhydrAMINE **OR** diphenhydrAMINE, methocarbamol, naloxone **AND** sodium chloride flush, ondansetron (ZOFRAN) IV, ondansetron (ZOFRAN) IV, oxyCODONE-acetaminophen **AND** oxyCODONE, sodium chloride  Time Spent in minutes  30   Lala Lund M.D on 07/24/2021 at 9:33 AM  To page go to [email or web address]   [facility] -  Office  [phone]  See all Orders from today for further details    Objective:   Vitals:   07/24/21 0800 07/24/21 0900 07/24/21 0905 07/24/21 0910  BP: 119/88     Pulse:   80   Resp: 14 13 12    Temp:      TempSrc:      SpO2: 95% 98% 98% 98%  Weight:      Height:        Wt Readings from Last 3 Encounters:  07/24/21 105.9 kg  07/02/21 113.9 kg  03/29/21 113 kg     Intake/Output Summary (Last 24 hours) at 07/24/2021 0933 Last data filed at 07/24/2021 0900 Gross per 24 hour  Intake 3150.93 ml  Output 2420 ml  Net 730.93 ml     Physical Exam  Awake Alert, No new F.N deficits, Normal affect [place].AT,PERRAL Supple Neck, No JVD,   Symmetrical Chest wall movement, reduced left sided breath sounds with left chest tube in place RRR,No Gallops, Rubs or new Murmurs,  +ve B.Sounds, Abd Soft, No  tenderness,   No Cyanosis, Clubbing or edema     Data Review:    CBC Recent Labs  Lab 07/20/21 0101 07/21/21 0106 07/22/21 0410 07/23/21 0105 07/23/21 1709 07/23/21 1747 07/24/21 0457 07/24/21 0503  WBC 20.9* 21.9* 25.6* 19.4*  --   --   --  27.4*  HGB 15.3 15.3 14.9 12.2* 13.9 13.9 12.2* 11.7*  HCT 48.0 46.2 43.9 37.2* 41.0 41.0 36.0* 36.0*  PLT 425* 378 454* 398  --   --   --  528*  MCV 89.7 88.0 86.9 88.4  --   --   --  88.5  MCH 28.6 29.1 29.5 29.0  --   --   --  28.7  MCHC 31.9 33.1 33.9 32.8  --   --   --  32.5  RDW 14.6 14.4 14.4 14.4  --   --   --  14.3  LYMPHSABS 2.9 2.7 1.0 2.7  --   --   --   --   MONOABS 1.3* 2.2* 2.6* 2.3*  --   --   --   --   EOSABS 0.2 0.2 0.0 0.1  --   --   --   --   BASOSABS 0.0 0.1 0.0 0.1  --   --   --   --     Electrolytes Recent Labs  Lab 07/19/21 0127 07/19/21 0746  07/20/21 0101 07/21/21 0106 07/22/21 0107 07/23/21 0105 07/23/21 1709 07/23/21 1747 07/24/21 0457 07/24/21 0503  NA 136  --  132* 132* 132* 129* 135 135 137 134*  K 3.9  --  5.1 4.2 4.7 3.5 4.7 4.7 4.7 4.6  CL 99  --  98 95* 96* 92*  --   --   --  99  CO2 29  --  24 28 28 30   --   --   --  30  GLUCOSE 119*  --  110* 118* 125* 104*  --   --   --  138*  BUN 12  --  17 19 17 16   --   --   --  19  CREATININE 0.85  --  0.92 0.92 0.94 0.88  --   --   --  0.90  CALCIUM 8.0*  --  8.2* 8.1* 7.9* 7.5*  --   --   --  7.8*  AST 38  --  50* 34 17 13*  --   --   --   --   ALT 179*  --  148* 106* 68* 43  --   --   --   --   ALKPHOS 168*  --  174* 164* 136* 116  --   --   --   --   BILITOT 0.4  --  0.9 0.8 0.9 0.4  --   --   --   --   ALBUMIN 2.1*  --  2.3* 2.0* 2.0* 1.7*  --   --   --   --   MG 2.3  --  2.3 2.1 2.0 2.1  --   --   --   --   CRP  --  7.7* 13.1* 18.9* 18.0* 22.5*  --   --   --   --   PROCALCITON  --  0.12 0.31 0.23 0.15 0.20  --   --   --   --   BNP  --  6.0 4.0 8.1 11.7 7.1  --   --   --   --      ------------------------------------------------------------------------------------------------------------------ No results for input(s): CHOL, HDL, LDLCALC, TRIG, CHOLHDL, LDLDIRECT in the last 72 hours.  Lab Results  Component Value Date   HGBA1C 5.4 03/03/2018    No results for input(s): TSH, T4TOTAL, T3FREE, THYROIDAB in the last 72 hours.  Invalid input(s): FREET3 ------------------------------------------------------------------------------------------------------------------ ID Labs Recent Labs  Lab 07/19/21 0746 07/20/21 0101 07/21/21 0106 07/22/21 0107 07/22/21 0410 07/23/21 0105 07/24/21 0503  WBC  --  20.9* 21.9*  --  25.6* 19.4* 27.4*  PLT  --  425* 378  --  454* 398 528*  CRP 7.7* 13.1* 18.9* 18.0*  --  22.5*  --   PROCALCITON 0.12 0.31 0.23 0.15  --  0.20  --   CREATININE  --  0.92 0.92 0.94  --  0.88 0.90   Cardiac Enzymes No results for input(s): CKMB, TROPONINI, MYOGLOBIN in the last 168 hours.  Invalid input(s): CK    Radiology Reports CT CHEST W CONTRAST  Result Date: 07/22/2021 CLINICAL DATA:  Old male with history of empyema.  Follow-up study. EXAM: CT CHEST WITH CONTRAST TECHNIQUE: Multidetector CT imaging of the chest was performed during intravenous contrast administration. CONTRAST:  84mL OMNIPAQUE IOHEXOL 300 MG/ML  SOLN COMPARISON:  Chest CT 07/18/2021. FINDINGS: Cardiovascular: Heart size is normal. There is no significant pericardial fluid, thickening or pericardial calcification. There is aortic atherosclerosis, as well as  atherosclerosis of the great vessels of the mediastinum and the coronary arteries, including calcified atherosclerotic plaque in the left anterior descending coronary artery. Mediastinum/Nodes: No pathologically enlarged mediastinal or hilar lymph nodes. Esophagus is unremarkable in appearance. No axillary lymphadenopathy. Lungs/Pleura: Compared to the prior examination there has been interval placement of a small bore  pigtail drainage catheter in the base of the left hemithorax. Previously noted loculated pleural fluid collection appears increased in size compared to the prior examination. Small amount of gas now present within the pleural cavity [place] be iatrogenic. Extensive passive atelectasis in the left lung, including complete atelectasis of the left lower lobe which was previously partially aerated. There is a small amount of atelectasis and probable airspace consolidation now lying dependently in the right lower lobe as well. No significant right pleural fluid. Upper Abdomen: Unremarkable. Musculoskeletal: There are no aggressive appearing lytic or blastic lesions noted in the visualized portions of the skeleton. IMPRESSION: 1. Interval placement of pigtail drainage catheter in large loculated left pleural effusion. This effusion appears to of increased slightly in size compared to the prior examination, with worsening atelectasis in the left lung. 2. New area of atelectasis and some airspace consolidation in the right lower lobe, concerning for developing pneumonia or sequela of aspiration. 3. Aortic atherosclerosis, in addition to left anterior descending coronary artery disease. Please note that although the presence of coronary artery calcium documents the presence of coronary artery disease, the severity of this disease and any potential stenosis cannot be assessed on this non-gated CT examination. Assessment for potential risk factor modification, dietary therapy or pharmacologic therapy [place] be warranted, if clinically indicated. Aortic Atherosclerosis (ICD10-I70.0). Electronically Signed   By: Vinnie Langton M.D.   On: 07/22/2021 12:47   DG Chest Port 1 View  Result Date: 07/24/2021 CLINICAL DATA:  Chest tube present post lobectomy EXAM: PORTABLE CHEST 1 VIEW COMPARISON:  07/23/2021 FINDINGS: Left chest tubes remain present. No pneumothorax. Decreased left pleural effusion. Persistent patchy opacities in the  left lung with improved aeration. Similar cardiomediastinal contours. IMPRESSION: Chest tubes present without pneumothorax. Decreased left pleural effusion and persistent patchy left opacities with improved aeration. Electronically Signed   By: Macy Mis M.D.   On: 07/24/2021 08:23   DG Chest Port 1 View  Result Date: 07/23/2021 CLINICAL DATA:  LEFT thoracotomy EXAM: PORTABLE CHEST 1 VIEW COMPARISON:  CT 07/22/2021 FINDINGS: Interval reduction in LEFT pleural fluid. 2 LEFT chest tubes in place. No pneumothorax. Low lung volumes. Basilar atelectasis. IMPRESSION: 1. Decrease in volume of LEFT loculated effusion. 2. Two chest tubes LEFT hemithorax without pneumothorax. 3. LEFT basilar atelectasis and low lung volumes Electronically Signed   By: Suzy Bouchard M.D.   On: 07/23/2021 18:02   DG CHEST PORT 1 VIEW  Result Date: 07/21/2021 CLINICAL DATA:  Chest pain.  Follow-up left pleural effusion. EXAM: PORTABLE CHEST 1 VIEW COMPARISON:  07/20/2021 FINDINGS: Left pleural pigtail catheter remains in place. A moderate left pleural effusion is unchanged in size. No pneumothorax visualized. Left lower lobe atelectasis is also stable. Right lung remains grossly clear. Heart size remains within normal limits. IMPRESSION: Stable moderate left pleural effusion and left lower lobe atelectasis. Electronically Signed   By: Marlaine Hind M.D.   On: 07/21/2021 08:46   ECHOCARDIOGRAM COMPLETE  Result Date: 07/21/2021    ECHOCARDIOGRAM REPORT   Patient Name:   Victor [name] Date of Exam: 07/21/2021 Medical Rec #:  [id number]    Height:  71.0 in Accession #:    [id number]   Weight:       249.6 lb Date of Birth:  30-Apr-1976   BSA:          2.317 m Patient Age:    4 years     BP:           125/78 mmHg Patient Gender: M            HR:           89 bpm. Exam Location:  Inpatient Procedure: 2D Echo, Cardiac Doppler and Color Doppler Indications:    CHF  History:        Patient has no prior history of Echocardiogram  examinations.                 Pleural drain in place.  Sonographer:    [name] RDCS Referring Phys: [id number] Margare[address] Mackintosh [place]  1. Left ventricular ejection fraction, by estimation, is 65 to 70%. The left ventricle has normal function. The left ventricle has no regional wall motion abnormalities. There is mild left ventricular hypertrophy. Left ventricular diastolic parameters were normal.  2. Right ventricular systolic function is normal. The right ventricular size is normal. Tricuspid regurgitation signal is inadequate for assessing PA pressure.  3. The mitral valve is normal in structure. No evidence of mitral valve regurgitation.  4. The aortic valve was not well visualized. Aortic valve regurgitation is not visualized. No aortic stenosis is present. FINDINGS  Left Ventricle: Left ventricular ejection fraction, by estimation, is 65 to 70%. The left ventricle has normal function. The left ventricle has no regional wall motion abnormalities. The left ventricular internal cavity size was normal in size. There is  mild left ventricular hypertrophy. Left ventricular diastolic parameters were normal. Right Ventricle: The right ventricular size is normal. No increase in right ventricular wall thickness. Right ventricular systolic function is normal. Tricuspid regurgitation signal is inadequate for assessing PA pressure. Left Atrium: Left atrial size was normal in size. Right Atrium: Right atrial size was normal in size. Pericardium: There is no evidence of pericardial effusion. Mitral Valve: The mitral valve is normal in structure. No evidence of mitral valve regurgitation. Tricuspid Valve: The tricuspid valve is normal in structure. Tricuspid valve regurgitation is trivial. Aortic Valve: The aortic valve was not well visualized. Aortic valve regurgitation is not visualized. No aortic stenosis is present. Pulmonic Valve: The pulmonic valve was not well visualized. Pulmonic valve regurgitation is not  visualized. Aorta: The aortic root is normal in size and structure. IAS/Shunts: The interatrial septum was not well visualized.  LEFT VENTRICLE PLAX 2D LVIDd:         4.30 cm   Diastology LVIDs:         3.00 cm   LV e' medial:    6.53 cm/s LV PW:         1.10 cm   LV E/e' medial:  8.3 LV IVS:        1.10 cm   LV e' lateral:   9.25 cm/s LVOT diam:     2.30 cm   LV E/e' lateral: 5.9 LV SV:         76 LV SV Index:   33 LVOT Area:     4.15 cm  RIGHT VENTRICLE RV Basal diam:  3.20 cm LEFT ATRIUM             Index        RIGHT ATRIUM  Index LA diam:        4.00 cm 1.73 cm/m   RA Area:     9.00 cm LA Vol (A2C):   89.7 ml 38.72 ml/m  RA Volume:   14.30 ml 6.17 ml/m LA Vol (A4C):   63.9 ml 27.58 ml/m LA Biplane Vol: 77.7 ml 33.54 ml/m  AORTIC VALVE LVOT Vmax:   121.00 cm/s LVOT Vmean:  78.600 cm/s LVOT VTI:    0.182 m  AORTA Ao Root diam: 3.40 cm MITRAL VALVE MV Area (PHT): 3.48 cm    SHUNTS MV Decel Time: 218 msec    Systemic VTI:  0.18 m MV E velocity: 54.50 cm/s  Systemic Diam: 2.30 cm MV A velocity: 70.70 cm/s MV E/A ratio:  0.77 Oswaldo Milian MD Electronically signed by Oswaldo Milian MD Signature Date/Time: 07/21/2021/5:51:59 PM    Final

## 2021-07-24 NOTE — Progress Notes (Signed)
Patient is s/p left chest tube placement with IR on 11/24, which was removed during surgery yesterday.   IR will sign off but is available for further assistant.  Please call IR for questions and concerns.   Armando Gang Lynsee Wands PA-C 07/24/2021 1:18 PM

## 2021-07-24 NOTE — Progress Notes (Signed)
EVENING ROUNDS NOTE :     [address]       [place] [address]             [phone]                 1 Day Post-Op Procedure(s) (LRB): THORACOTOMY with drainage of empyema (Left) DECORTICATION of left lung (Left)   Total Length of Stay:  LOS: 12 days  Events:   No events Resting comfortably    BP 129/78 (BP Location: Left Arm)   Pulse 80   Temp 98 F (36.7 C) (Oral)   Resp 10   Ht 5\' 11"  (1.803 m)   Wt 105.9 kg   SpO2 95%   BMI 32.55 kg/m      FiO2 (%):  [96 %-100 %] 96 %   sodium chloride 10 mL/hr at 07/24/21 1700   meropenem (MERREM) IV Stopped (07/24/21 1320)    I/O last 3 completed shifts: In: 2017.3 [I.V.:2007.3] Out: 3415 [Urine:2905; Blood:100; Chest Tube:410]   CBC Latest Ref Rng & Units 07/24/2021 07/24/2021 07/23/2021  WBC 4.0 - 10.5 K/uL 27.4(H) - -  Hemoglobin 13.0 - 17.0 g/dL 11.7(L) 12.2(L) 13.9  Hematocrit 39.0 - 52.0 % 36.0(L) 36.0(L) 41.0  Platelets 150 - 400 K/uL 528(H) - -    BMP Latest Ref Rng & Units 07/24/2021 07/24/2021 07/23/2021  Glucose 70 - 99 mg/dL 138(H) - -  BUN 6 - 20 mg/dL 19 - -  Creatinine 0.61 - 1.24 mg/dL 0.90 - -  BUN/Creat Ratio 9 - 20 - - -  Sodium 135 - 145 mmol/L 134(L) 137 135  Potassium 3.5 - 5.1 mmol/L 4.6 4.7 4.7  Chloride 98 - 111 mmol/L 99 - -  CO2 22 - 32 mmol/L 30 - -  Calcium 8.9 - 10.3 mg/dL 7.8(L) - -    ABG    Component Value Date/Time   PHART 7.422 07/24/2021 0457   PCO2ART 50.7 (H) 07/24/2021 0457   PO2ART 145 (H) 07/24/2021 0457   HCO3 33.0 (H) 07/24/2021 0457   TCO2 35 (H) 07/24/2021 0457   O2SAT 99.0 07/24/2021 0457       Melodie Bouillon, MD 07/24/2021 6:53 PM

## 2021-07-25 ENCOUNTER — Inpatient Hospital Stay (HOSPITAL_COMMUNITY): Payer: Medicaid Other

## 2021-07-25 DIAGNOSIS — J189 Pneumonia, unspecified organism: Secondary | ICD-10-CM | POA: Diagnosis not present

## 2021-07-25 LAB — COMPREHENSIVE METABOLIC PANEL
ALT: 28 U/L (ref 0–44)
AST: 20 U/L (ref 15–41)
Albumin: 1.5 g/dL — ABNORMAL LOW (ref 3.5–5.0)
Alkaline Phosphatase: 110 U/L (ref 38–126)
Anion gap: 5 (ref 5–15)
BUN: 24 mg/dL — ABNORMAL HIGH (ref 6–20)
CO2: 30 mmol/L (ref 22–32)
Calcium: 7.4 mg/dL — ABNORMAL LOW (ref 8.9–10.3)
Chloride: 103 mmol/L (ref 98–111)
Creatinine, Ser: 0.83 mg/dL (ref 0.61–1.24)
GFR, Estimated: >60 mL/min (ref >60)
Glucose, Bld: 121 mg/dL — ABNORMAL HIGH (ref 70–99)
Potassium: 3.6 mmol/L (ref 3.5–5.1)
Sodium: 138 mmol/L (ref 135–145)
Total Bilirubin: 0.4 mg/dL (ref 0.3–1.2)
Total Protein: 4.7 g/dL — ABNORMAL LOW (ref 6.5–8.1)

## 2021-07-25 LAB — CBC WITH DIFFERENTIAL/PLATELET
Abs Immature Granulocytes: 0.19 10*3/uL — ABNORMAL HIGH (ref 0.00–0.07)
Basophils Absolute: 0 10*3/uL (ref 0.0–0.1)
Basophils Relative: 0 %
Eosinophils Absolute: 0.1 10*3/uL (ref 0.0–0.5)
Eosinophils Relative: 1 %
HCT: 27.6 % — ABNORMAL LOW (ref 39.0–52.0)
Hemoglobin: 9.1 g/dL — ABNORMAL LOW (ref 13.0–17.0)
Immature Granulocytes: 1 %
Lymphocytes Relative: 20 %
Lymphs Abs: 2.9 10*3/uL (ref 0.7–4.0)
MCH: 29.5 pg (ref 26.0–34.0)
MCHC: 33 g/dL (ref 30.0–36.0)
MCV: 89.6 fL (ref 80.0–100.0)
Monocytes Absolute: 1.4 10*3/uL — ABNORMAL HIGH (ref 0.1–1.0)
Monocytes Relative: 10 %
Neutro Abs: 9.5 10*3/uL — ABNORMAL HIGH (ref 1.7–7.7)
Neutrophils Relative %: 68 %
Platelets: 427 10*3/uL — ABNORMAL HIGH (ref 150–400)
RBC: 3.08 MIL/uL — ABNORMAL LOW (ref 4.22–5.81)
RDW: 14.6 % (ref 11.5–15.5)
WBC: 14.1 10*3/uL — ABNORMAL HIGH (ref 4.0–10.5)
nRBC: 0 % (ref 0.0–0.2)

## 2021-07-25 LAB — ACID FAST SMEAR (AFB, MYCOBACTERIA): Acid Fast Smear: NEGATIVE

## 2021-07-25 LAB — PROCALCITONIN: Procalcitonin: 0.22 ng/mL

## 2021-07-25 LAB — MAGNESIUM: Magnesium: 2.1 mg/dL (ref 1.7–2.4)

## 2021-07-25 LAB — SURGICAL PATHOLOGY

## 2021-07-25 LAB — C-REACTIVE PROTEIN: CRP: 12.7 mg/dL — ABNORMAL HIGH (ref <1.0)

## 2021-07-25 MED ORDER — POTASSIUM CHLORIDE CRYS ER 20 MEQ PO TBCR
40.0000 meq | EXTENDED_RELEASE_TABLET | Freq: Once | ORAL | Status: AC
  Administered 2021-07-25: 40 meq via ORAL
  Filled 2021-07-25: qty 2

## 2021-07-25 NOTE — Op Note (Signed)
07/23/2021 Victor [name] [id number]  Surgeon: Gaye Pollack, MD   First Assistant: Ellwood Handler, PA-C : An experienced assistant was required given the complexity of this surgery and the standard of surgical care. The assistant was needed for exposure, dissection, suctioning, retraction of delicate tissues and sutures, instrument exchange and for overall help during this procedure.   Preoperative Diagnosis: Left empyema   Postoperative Diagnosis: Left empyema   Procedure:  1. Left muscle-sparing thoracotomy  2. Drainage of empyema  3. Decortication of the left lung   Anesthesia: General Endotracheal   Clinical History/Surgical Indication:   This 45 year old gentleman has left lower lobe pneumonia with a parapneumonic effusion that is most likely loculated empyema.  Despite antibiotics and percutaneous left lower drainage the effusion appears to be increasing and he still has an increasing leukocytosis and CRP level.  This is all consistent with progressive empyema. CT chest yesterday shows increased loculated left empyema with compressive atelectasis of left lung. Small right effusion and atelectasis. Will plan to proceed with left thoracotomy for drainage of empyema. I reviewed the CT image with the patient and his wife and surgical procedure, benefits and risks. They agree to proceed.  Preparation:   The patient was seen in the preoperative holding area and the correct patient, correct operation, correct operative sidewere confirmed with the patient after reviewing the medical record and CT scan. The consent was signed by me. Preoperative antibiotics were given. The left side of the chest was signed by me. The patient was taken back to the operating room and positioned supine on the operating room table. After being placed under general endotracheal anesthesia by the anesthesia team using a double lumen tube a foley catheter was placed. The patient was turned into the right lateral  decubitus position. The pigtail catheter was removed. The chest was prepped with betadine soap and solution. A surgical time-out was taken and the correct patient,operative side, and operative procedure were confirmed with the nursing and anesthesia staff.   Operative Procedure:   A short lateral muscle-sparing thoracotomy incision was made and the chest entered through the 6th ICS. The pleural space was filled with a yellowish multiloculated empyema that was sero-purulent with a thick fibrinous peel over the lung. The empyema was completely drained and the left lung decorticated. This allowed complete expansion of the lung. The chest was irrigated with warm saline. Hemostasis was complete. Two 21 F Bard drains were placed through separate stab incisions and were positioned posteriorly and anteriorly in the pleural space. The ribs were reapproximated with # 2 vicryl pericostal sutures and the muscles returned to their normal anatomic position. The subcutaneous tissue was closed with 2-0 vicryl continuous suture. The skin was closed with 3-0 vicryl subcuticular suture. All sponge, needle, and instrument counts were reported correct at the end of the case. Dry sterile dressings were placed over the incisions and around the chest tubes which were connected to pleurevac suction. The patient was turned supine, extubated,then transported to the PACU in satisfactory and stable condition.

## 2021-07-25 NOTE — Progress Notes (Signed)
2 Days Post-Op Procedure(s) (LRB): THORACOTOMY with drainage of empyema (Left) DECORTICATION of left lung (Left) Subjective: Expected incisional pain but under control on oral meds. Breathing much better.  Objective: Vital signs in last 24 hours: Temp:  [98 F (36.7 C)-98.4 F (36.9 C)] 98.3 F (36.8 C) (11/30 0000) Pulse Rate:  [80] 80 (11/29 0905) Cardiac Rhythm: Normal sinus rhythm (11/30 0400) Resp:  [10-20] 20 (11/30 0650) BP: (116-140)/(59-87) 117/68 (11/30 0600) SpO2:  [89 %-98 %] 96 % (11/30 0741) FiO2 (%):  [96 %-97 %] 97 % (11/29 1951) Weight:  [110.2 kg] 110.2 kg (11/30 0650)  Hemodynamic parameters for last 24 hours:    Intake/Output from previous day: 11/29 0701 - 11/30 0700 In: 1767.4 [P.O.:720; I.V.:247.4; IV Piggyback:800] Out: 1500 [Urine:1350; Chest Tube:150] Intake/Output this shift: No intake/output data recorded.  General appearance: alert and cooperative Neurologic: intact Heart: regular rate and rhythm, S1, S2 normal, no murmur Lungs: diminished breath sounds left base Extremities: extremities normal, atraumatic, no cyanosis or edema Wound: dressing dry No air leak from chest tubes.  Lab Results: Recent Labs    07/24/21 0503 07/25/21 0554  WBC 27.4* 14.1*  HGB 11.7* 9.1*  HCT 36.0* 27.6*  PLT 528* 427*   BMET:  Recent Labs    07/24/21 0503 07/25/21 0554  NA 134* 138  K 4.6 3.6  CL 99 103  CO2 30 30  GLUCOSE 138* 121*  BUN 19 24*  CREATININE 0.90 0.83  CALCIUM 7.8* 7.4*    PT/INR: No results for input(s): LABPROT, INR in the last 72 hours. ABG    Component Value Date/Time   PHART 7.422 07/24/2021 0457   HCO3 33.0 (H) 07/24/2021 0457   TCO2 35 (H) 07/24/2021 0457   O2SAT 99.0 07/24/2021 0457   CBG (last 3)  Recent Labs    07/23/21 2008 07/23/21 2347 07/24/21 0454  GLUCAP 139* 148* 137*   CXR: much improved aeration of LLL.  Assessment/Plan: S/P Procedure(s) (LRB): THORACOTOMY with drainage of empyema  (Left) DECORTICATION of left lung (Left)  POD 2 Hemodynamically stable in sinus rhythm. Leukocytosis improving and no fever.  Operative cultures negative so far and no organisms in fluid. Continue IV antibiotic for pneumonia and plan to send home on oral antibiotic for a specified course. DC posterior chest tube today. Keep other tube in to water seal and [place] be able to remove tomorrow.  IS, ambulation Transfer to 4E.   LOS: 13 days    Victor [name] 07/25/2021

## 2021-07-25 NOTE — Progress Notes (Signed)
Pt arrived to 4E from [place]. CHG bath done. Telemetry applied and CCMD called. VSS. Pt oriented to room. Call light in place.  Raelyn Number, RN

## 2021-07-25 NOTE — Progress Notes (Signed)
Pharmacy Antibiotic Note  Victor [name] is a 45 y.o. male admitted on 07/11/2021 with pneumonia.  Pharmacy has been consulted for meropenem dosing.  Patient with persistently elevated WBC (25.6) and CRP (18.0) not improving on antibiotics. CXR shows left pleural effusion that has worsened since 11/16. Repeat CT ordered today and possible plans for thoracotomy & surgical debridement 11/28 pending the results. Day 5 meropenem (antibiotics started 11/16 and coverage broadened due to little improvement) -WBC= 14.1, afebrile -pleural fluid cultures- ngtd and pending  Plan: Meropenem 1g IV q8h Monitor cultures & sensitivities Narrow as able  Height: 5\' 11"  (180.3 cm) Weight: 110.2 kg (243 lb) IBW/kg (Calculated) : 75.3  Temp (24hrs), Avg:98.2 F (36.8 C), Min:97.9 F (36.6 C), Max:98.4 F (36.9 C)  Recent Labs  Lab 07/21/21 0106 07/22/21 0107 07/22/21 0410 07/23/21 0105 07/24/21 0503 07/25/21 0554  WBC 21.9*  --  25.6* 19.4* 27.4* 14.1*  CREATININE 0.92 0.94  --  0.88 0.90 0.83     Estimated Creatinine Clearance: 139 mL/min (by C-G formula based on SCr of 0.83 mg/dL).    No Known Allergies  Antimicrobials this admission: Merrem 11/26>> Levaquin PO 11/24>>11/26 Unasyn 11/24>>11/26 Azith 11/16 >>11/20 CTX 11/16 >>11/23  Microbiology results: 11/16 [place]: neg 11/16 COVID and flu: neg 11/17 MRSA PCR in negative 11/19 sputum: normal flora 11/24 pleural fluid: cloudy, inc neutrophils, high LD  Thank you for allowing pharmacy to be a part of this patient's care.  Hildred Laser, PharmD Clinical Pharmacist **Pharmacist phone directory can now be found on [email or web address] ([email or web address]).  Listed under [place].

## 2021-07-25 NOTE — Progress Notes (Signed)
PROGRESS NOTE    Victor [name]  YHC:[id number] DOB: 03-28-1976 DOA: 07/11/2021 PCP: [name], DO   Chief Complaint  Patient presents with   Shortness of Breath  Brief Narrative/Hospital Course: Victor [name], 45 y.o. male with PMH of htn, hld, tobacco abuse, GERD, chronic knee pain s/p Rt TKA/multiple revisions, on home narcotics and as needed Narcan, presented to the [facility] with 1 week history of  cough, shortness of breath along with left-sided pleuritic chest pain,- found to  have pneumonia and admitted on 07/11/2021, by that time he had symptoms for 1 week already.  Despite appropriate treatment he continued to have left therapeutic chest pain CT scan demonstrated moderate to large left-sided pleural effusion around the infiltrate suspicious for parapneumonic effusion versus empyema Seen by TCTS, S/P Lt thoracotomy and decortication of left lung 11/28 with empymea drainage, manged with meropenem and chest tubes x2.    Subjective: Seen this am On bedside chair On RA No acute events overnight Seen by TCTS this am and transferring to 4E  Assessment & Plan:  Acute hypoxic respiratory failure due to pneumonia and pleural effusion Bacterial community-acquired pneumonia Sepsis POA 2/2 CAP Complicated left pleural effusion due to pneumonia: S/P Lt thoracotomy and decortication of left lung 11/28.Doing well, on room air.  Leukocytosis significantly improved.  Afebrile. Had 2 chest tube in place 1 being removed, being transferred to 66, continue the remaining tube on waterseal, continue plan as per thoracic surgery team and appreciate input.    Monitor  counts.  Continue current meropenem, I-S, pulmonary support, nebulizer bronchodilators.  OR culture no growth so far.   Rt TKA/multiple revisions Pain on chronic narcotics/prn Narcan spray at home: Continue IV Toradol, as needed Dilaudid IV and oral oxy.Minimize narcotics.  Resume home triptan, Robaxin and  trazodone.  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  Improved with [place].  Hypocalcemia/Hypokalemia-resolved  Class I Obesity:Patient's Body mass index is 33.89 kg/m. : Will benefit with PCP follow-up, weight loss  healthy lifestyle and outpatient sleep evaluation.  DVT prophylaxis: enoxaparin (LOVENOX) injection 40 mg Start: 07/24/21 0445 SCD's Start: 07/23/21 1829 Code Status:   Code Status: Full Code Family Communication: plan of care discussed with patient at bedside. Status is: Inpatient Remains inpatient appropriate because: for ongoing chest tube management Disposition: Currently NOT medically stable for discharge. Anticipated Disposition: home  Objective: Vitals last 24 hrs: Vitals:   07/25/21 0612 07/25/21 0650 07/25/21 0740 07/25/21 0741  BP:      Pulse:      Resp: 15 20    Temp:      TempSrc:      SpO2: 92% 93% 96% 96%  Weight:  110.2 kg    Height:       Weight change: 4.355 kg  Intake/Output Summary (Last 24 hours) at 07/25/2021 0826 Last data filed at 07/25/2021 0651 Gross per 24 hour  Intake 1524.8 ml  Output 1470 ml  Net 54.8 ml   Net IO Since Admission: -4,413.57 mL [07/25/21 0826]   Physical Examination: General exam: AAox3, weak,older than stated age. HEENT:Oral mucosa moist, Ear/Nose WNL grossly,dentition normal. Respiratory system: B/l diminished, chest tubes x2, no use of accessory muscle, non tender. Cardiovascular system: S1 & S2 +,No JVD. Gastrointestinal system: Abdomen soft, NT,ND, BS+. Nervous System:Alert, awake, moving extremities. Extremities: edema none, distal peripheral  pulses palpable.  Skin: No rashes, no icterus. MSK: Normal muscle bulk, tone, power.  Medications reviewed:  Scheduled Meds:  amLODipine  5 mg Oral Daily   arformoterol  15 mcg Nebulization BID    bisacodyl  10 mg Oral Daily   budesonide (PULMICORT) nebulizer solution  0.5 mg Nebulization BID   Chlorhexidine Gluconate Cloth  6 each Topical Daily   dextromethorphan-guaiFENesin  1 tablet Oral BID   enoxaparin (LOVENOX) injection  40 mg Subcutaneous Daily   HYDROmorphone       ketorolac  15 mg Intravenous Q6H   nicotine  21 mg Transdermal Daily   nystatin  5 mL Oral QID   pantoprazole  40 mg Oral BID   potassium chloride  40 mEq Oral Once   senna-docusate  1 tablet Oral QHS   Continuous Infusions:  sodium chloride 10 mL/hr at 07/25/21 0358   meropenem (MERREM) IV 1 g (07/25/21 0400)   Diet Order             Diet regular Room service appropriate? Yes; Fluid consistency: Thin  Diet effective now                 Weight change: 4.355 kg  Wt Readings from Last 3 Encounters:  07/25/21 110.2 kg  07/02/21 113.9 kg  03/29/21 113 kg     Consultants:see note  Procedures:see note Antimicrobials: Anti-infectives (From admission, onward)    Start     Dose/Rate Route Frequency Ordered Stop   07/21/21 0915  meropenem (MERREM) 1 g in sodium chloride 0.9 % 100 mL IVPB        1 g 200 mL/hr over 30 Minutes Intravenous Every 8 hours 07/21/21 0815     07/19/21 1030  Ampicillin-Sulbactam (UNASYN) 3 g in sodium chloride 0.9 % 100 mL IVPB  Status:  Discontinued        3 g 200 mL/hr over 30 Minutes Intravenous Every 8 hours 07/19/21 0940 07/21/21 0801   07/19/21 1030  levofloxacin (LEVAQUIN) tablet 500 mg  Status:  Discontinued        500 mg Oral Daily 07/19/21 0940 07/21/21 1407   07/17/21 2000  cefTRIAXone (ROCEPHIN) 2 g in sodium chloride 0.9 % 100 mL IVPB  Status:  Discontinued        2 g 200 mL/hr over 30 Minutes Intravenous Every 24 hours 07/17/21 0948 07/19/21 0940   07/12/21 2200  cefTRIAXone (ROCEPHIN) 2 g in sodium chloride 0.9 % 100 mL IVPB        2 g 200 mL/hr over 30 Minutes Intravenous Every 24 hours 07/12/21 0442 07/16/21 2147   07/12/21 2200  azithromycin (ZITHROMAX)  500 mg in sodium chloride 0.9 % 250 mL IVPB        500 mg 250 mL/hr over 60 Minutes Intravenous Every 24 hours 07/12/21 0442 07/15/21 2358   07/12/21 0530  cefTRIAXone (ROCEPHIN) 1 g in sodium chloride 0.9 % 100 mL IVPB  Status:  Discontinued        1 g 200 mL/hr over 30 Minutes Intravenous Every 24 hours 07/12/21 0442 07/12/21 0445   07/11/21 2230  cefTRIAXone (ROCEPHIN) 1 g in sodium chloride 0.9 % 100 mL IVPB        1 g 200 mL/hr over 30 Minutes Intravenous  Once 07/11/21 2223 07/11/21 2341   07/11/21 2230  azithromycin (ZITHROMAX) 500 mg in sodium chloride 0.9 % 250 mL IVPB        500 mg 250 mL/hr over 60 Minutes  Intravenous  Once 07/11/21 2223 07/11/21 2356      Culture/Microbiology    Component Value Date/Time   SDES FLUID 07/23/2021 1505   SDES FLUID 07/23/2021 1505   SPECREQUEST PLEURAL FLUID 07/23/2021 1505   [place] FLUID 07/23/2021 1505   CULT  07/23/2021 1505    NO FUNGUS ISOLATED AFTER 2 DAYS Performed at [facility], [place] [address] [place], [place] [address]    CULT  07/23/2021 1505    NO GROWTH < 24 HOURS Performed at [facility], [place] [address], [place], [place] [address]    REPTSTATUS PENDING 07/23/2021 1505   REPTSTATUS PENDING 07/23/2021 1505    Other culture-see note  Unresulted Labs (From admission, onward)     Start     Ordered   07/25/21 0500  Procalcitonin  Daily,   R     Question:  Specimen collection method  Answer:  Lab=Lab collect   07/24/21 0933   07/25/21 0500  Magnesium  Daily,   R     Question:  Specimen collection method  Answer:  Lab=Lab collect   07/24/21 0933   07/25/21 0500  Comprehensive metabolic panel  Daily,   R     Question:  Specimen collection method  Answer:  Lab=Lab collect   07/24/21 0933   07/25/21 0500  C-reactive protein  Daily,   R     Question:  Specimen collection method  Answer:  Lab=Lab collect   07/24/21 0933   07/25/21 0500  CBC with Differential/Platelet  Daily,   R     Question:   Specimen collection method  Answer:  Lab=Lab collect   07/24/21 0933   07/23/21 1514  Acid Fast Culture with reflexed sensitivities  RELEASE UPON ORDERING,   TIMED       Comments: Specimen A: Phone [phone] Immunocompromised?  No  Antibiotic Treatment:  Meropenem Is the patient on airborne/droplet precautions? No Clinical History:  Left Empyema Special Instructions:  none Specimen Disposition:  Microbiology     07/23/21 1514   07/23/21 1514  Acid Fast Smear (AFB)  RELEASE UPON ORDERING,   TIMED       Comments: Specimen A: Phone [phone] Immunocompromised?  No  Antibiotic Treatment:  Meropenem Is the patient on airborne/droplet precautions? No Clinical History:  Left Empyema Special Instructions:  none Specimen Disposition:  Microbiology     07/23/21 1514   07/22/21 0500  CBC with Differential/Platelet  Daily,   R,   Status:  Canceled     Question:  Specimen collection method  Answer:  Lab=Lab collect   07/21/21 0800   07/19/21 1139  Pleural fluid culture w Gram Stain  (Bedside Thoracentesis)  Once,   R       Question:  Are there also cytology or pathology orders on this specimen?  Answer:  Yes   07/19/21 1139           Data Reviewed: I have personally reviewed following labs and imaging studies CBC: Recent Labs  Lab 07/20/21 0101 07/21/21 0106 07/22/21 0410 07/23/21 0105 07/23/21 1709 07/23/21 1747 07/24/21 0457 07/24/21 0503 07/25/21 0554  WBC 20.9* 21.9* 25.6* 19.4*  --   --   --  27.4* 14.1*  NEUTROABS 15.7* 15.5* 21.2* 13.8*  --   --   --   --  9.5*  HGB 15.3 15.3 14.9 12.2* 13.9 13.9 12.2* 11.7* 9.1*  HCT 48.0 46.2 43.9 37.2* 41.0 41.0 36.0* 36.0* 27.6*  MCV 89.7 88.0 86.9 88.4  --   --   --  88.5 89.6  PLT 425* 378 454* 398  --   --   --  528* 357*   Basic Metabolic Panel: Recent Labs  Lab 07/20/21 0101 07/21/21 0106 07/22/21 0107 07/23/21 0105 07/23/21 1709 07/23/21 1747 07/24/21 0457 07/24/21 0503 07/25/21 0554  NA 132* 132* 132* 129* 135  135 137 134* 138  K 5.1 4.2 4.7 3.5 4.7 4.7 4.7 4.6 3.6  CL 98 95* 96* 92*  --   --   --  99 103  CO2 24 28 28 30   --   --   --  30 30  GLUCOSE 110* 118* 125* 104*  --   --   --  138* 121*  BUN 17 19 17 16   --   --   --  19 24*  CREATININE 0.92 0.92 0.94 0.88  --   --   --  0.90 0.83  CALCIUM 8.2* 8.1* 7.9* 7.5*  --   --   --  7.8* 7.4*  MG 2.3 2.1 2.0 2.1  --   --   --   --  2.1   GFR: Estimated Creatinine Clearance: 139 mL/min (by C-G formula based on SCr of 0.83 mg/dL). Liver Function Tests: Recent Labs  Lab 07/20/21 0101 07/21/21 0106 07/22/21 0107 07/23/21 0105 07/25/21 0554  AST 50* 34 17 13* 20  ALT 148* 106* 68* 43 28  ALKPHOS 174* 164* 136* 116 110  BILITOT 0.9 0.8 0.9 0.4 0.4  PROT 6.3* 6.0* 5.8* 5.3* 4.7*  ALBUMIN 2.3* 2.0* 2.0* 1.7* 1.5*   No results for input(s): LIPASE, AMYLASE in the last 168 hours. No results for input(s): AMMONIA in the last 168 hours. Coagulation Profile: No results for input(s): INR, PROTIME in the last 168 hours. Cardiac Enzymes: No results for input(s): CKTOTAL, CKMB, CKMBINDEX, TROPONINI in the last 168 hours. BNP (last 3 results) No results for input(s): PROBNP in the last 8760 hours. HbA1C: No results for input(s): HGBA1C in the last 72 hours. CBG: Recent Labs  Lab 07/23/21 2008 07/23/21 2347 07/24/21 0454  GLUCAP 139* 148* 137*   Lipid Profile: No results for input(s): CHOL, HDL, LDLCALC, TRIG, CHOLHDL, LDLDIRECT in the last 72 hours. Thyroid Function Tests: No results for input(s): TSH, T4TOTAL, FREET4, T3FREE, THYROIDAB in the last 72 hours. Anemia Panel: No results for input(s): VITAMINB12, FOLATE, FERRITIN, TIBC, IRON, RETICCTPCT in the last 72 hours. Sepsis Labs: Recent Labs  Lab 07/21/21 0106 07/22/21 0107 07/23/21 0105 07/24/21 0503  PROCALCITON 0.23 0.15 0.20 0.28    Recent Results (from the past 240 hour(s))  Culture, fungus without smear     Status: None (Preliminary result)   Collection Time: 07/23/21   3:05 PM   Specimen: Pleural, Left; Body Fluid  Result Value Ref Range Status   Specimen Description FLUID  Final   Special Requests PLEURAL FLUID  Final   Culture   Final    NO FUNGUS ISOLATED AFTER 2 DAYS Performed at [facility], [address] [place], [place] [address]    Report Status PENDING  Incomplete  Aerobic/Anaerobic Culture w Gram Stain (surgical/deep wound)     Status: None (Preliminary result)   Collection Time: 07/23/21  3:05 PM   Specimen: Pleural, Left; Body Fluid  Result Value Ref Range Status   Specimen Description FLUID  Final   Special Requests PLEURAL FLUID  Final   Gram Stain NO WBC SEEN NO ORGANISMS SEEN   Final   Culture   Final  NO GROWTH < 24 HOURS Performed at [facility] [address], [place],  [address]    Report Status PENDING  Incomplete     Radiology Studies: DG Chest Port 1 View  Result Date: 07/24/2021 CLINICAL DATA:  Chest tube present post lobectomy EXAM: PORTABLE CHEST 1 VIEW COMPARISON:  07/23/2021 FINDINGS: Left chest tubes remain present. No pneumothorax. Decreased left pleural effusion. Persistent patchy opacities in the left lung with improved aeration. Similar cardiomediastinal contours. IMPRESSION: Chest tubes present without pneumothorax. Decreased left pleural effusion and persistent patchy left opacities with improved aeration. Electronically Signed   By: Macy Mis M.D.   On: 07/24/2021 08:23   DG Chest Port 1 View  Result Date: 07/23/2021 CLINICAL DATA:  LEFT thoracotomy EXAM: PORTABLE CHEST 1 VIEW COMPARISON:  CT 07/22/2021 FINDINGS: Interval reduction in LEFT pleural fluid. 2 LEFT chest tubes in place. No pneumothorax. Low lung volumes. Basilar atelectasis. IMPRESSION: 1. Decrease in volume of LEFT loculated effusion. 2. Two chest tubes LEFT hemithorax without pneumothorax. 3. LEFT basilar atelectasis and low lung volumes Electronically Signed   By: Suzy Bouchard M.D.   On: 07/23/2021 18:02      LOS: 13 days   Antonieta Pert, MD [facility]  07/25/2021, 8:26 AM

## 2021-07-26 ENCOUNTER — Inpatient Hospital Stay (HOSPITAL_COMMUNITY): Payer: Medicaid Other

## 2021-07-26 DIAGNOSIS — J189 Pneumonia, unspecified organism: Secondary | ICD-10-CM | POA: Diagnosis not present

## 2021-07-26 LAB — BASIC METABOLIC PANEL
Anion gap: 3 — ABNORMAL LOW (ref 5–15)
BUN: 15 mg/dL (ref 6–20)
CO2: 33 mmol/L — ABNORMAL HIGH (ref 22–32)
Calcium: 7.6 mg/dL — ABNORMAL LOW (ref 8.9–10.3)
Chloride: 103 mmol/L (ref 98–111)
Creatinine, Ser: 0.75 mg/dL (ref 0.61–1.24)
GFR, Estimated: >60 mL/min (ref >60)
Glucose, Bld: 95 mg/dL (ref 70–99)
Potassium: 4.3 mmol/L (ref 3.5–5.1)
Sodium: 139 mmol/L (ref 135–145)

## 2021-07-26 LAB — CBC WITH DIFFERENTIAL/PLATELET
Abs Immature Granulocytes: 0.14 10*3/uL — ABNORMAL HIGH (ref 0.00–0.07)
Basophils Absolute: 0.1 10*3/uL (ref 0.0–0.1)
Basophils Relative: 1 %
Eosinophils Absolute: 0.3 10*3/uL (ref 0.0–0.5)
Eosinophils Relative: 3 %
HCT: 27.2 % — ABNORMAL LOW (ref 39.0–52.0)
Hemoglobin: 8.6 g/dL — ABNORMAL LOW (ref 13.0–17.0)
Immature Granulocytes: 1 %
Lymphocytes Relative: 30 %
Lymphs Abs: 3.2 10*3/uL (ref 0.7–4.0)
MCH: 28.7 pg (ref 26.0–34.0)
MCHC: 31.6 g/dL (ref 30.0–36.0)
MCV: 90.7 fL (ref 80.0–100.0)
Monocytes Absolute: 1 10*3/uL (ref 0.1–1.0)
Monocytes Relative: 10 %
Neutro Abs: 5.8 10*3/uL (ref 1.7–7.7)
Neutrophils Relative %: 55 %
Platelets: 469 10*3/uL — ABNORMAL HIGH (ref 150–400)
RBC: 3 MIL/uL — ABNORMAL LOW (ref 4.22–5.81)
RDW: 14.6 % (ref 11.5–15.5)
WBC: 10.6 10*3/uL — ABNORMAL HIGH (ref 4.0–10.5)
nRBC: 0 % (ref 0.0–0.2)

## 2021-07-26 LAB — PROCALCITONIN: Procalcitonin: 0.13 ng/mL

## 2021-07-26 LAB — C-REACTIVE PROTEIN: CRP: 6.4 mg/dL — ABNORMAL HIGH (ref <1.0)

## 2021-07-26 MED ORDER — AMITRIPTYLINE HCL 10 MG PO TABS
10.0000 mg | ORAL_TABLET | Freq: Every day | ORAL | Status: DC
  Administered 2021-07-26 – 2021-07-27 (×2): 10 mg via ORAL
  Filled 2021-07-26 (×3): qty 1

## 2021-07-26 MED ORDER — TRAZODONE HCL 50 MG PO TABS: 50.0000 mg | ORAL_TABLET | Freq: Every evening | ORAL | Status: DC | PRN

## 2021-07-26 NOTE — Progress Notes (Addendum)
Upon removing bandage to remove chest tube, notable increase in edema was assessed around thoracotomy incision site with palpable firmness. A change from previous assessment. TCTS was paged and made aware. Instructed to leave chest tube in place and they would follow up. Bandage was reapplied and secured.  Raelyn Number, RN

## 2021-07-26 NOTE — Progress Notes (Signed)
      [address]Suite [address]       [place] [address]             [phone]      CTSP for swelling at incision  Chest tube ordered to be removed this afternoon.  When RN went to remove she noticed increased swelling at thoracotomy incision.    BP 124/74 (BP Location: Left Arm)   Pulse 80   Temp 98 F (36.7 C) (Oral)   Resp 18   Ht 5\' 11"  (1.803 m)   Wt 110.2 kg   SpO2 98%   BMI 33.89 kg/m   Some incisional pain but no recent increase  He has a lot of ecchymosis superior to the incision. On exam findings c/w a seroma with a large amount of fluid under incision. Some erythema at incision but none over fluid collection. No crepitance or unusual tenderness.  Will leave tube in this evening and remove in AM Monitor wound  Remo Lipps C. Roxan Hockey, MD [facility] [phone]

## 2021-07-26 NOTE — Plan of Care (Signed)

## 2021-07-26 NOTE — Progress Notes (Addendum)
      [address]       [place] [address]             [phone]      3 Days Post-Op Procedure(s) (LRB): THORACOTOMY with drainage of empyema (Left) DECORTICATION of left lung (Left)  Subjective:  Continues have pain at chest tube site.  Otherwise doing okay.  Objective: Vital signs in last 24 hours: Temp:  [97.9 F (36.6 C)-98.7 F (37.1 C)] 98.7 F (37.1 C) (12/01 0347) Pulse Rate:  [76-90] 76 (12/01 0347) Cardiac Rhythm: Sinus tachycardia (11/30 1900) Resp:  [11-20] 20 (12/01 0440) BP: (114-141)/(67-97) 127/78 (12/01 0347) SpO2:  [91 %-98 %] 98 % (12/01 0347) FiO2 (%):  [96 %] 96 % (11/30 2040)  Intake/Output from previous day: 11/30 0701 - 12/01 0700 In: 528.9 [P.O.:240; I.V.:20; IV Piggyback:268.9] Out: 2081 [Urine:1950; Stool:1; Chest Tube:130]  General appearance: alert, cooperative, and no distress Heart: regular rate and rhythm Lungs: diminished left base Abdomen: soft, non-tender; bowel sounds normal; no masses,  no organomegaly Extremities: extremities normal, atraumatic, no cyanosis or edema Wound: clean and dry, some ecchymosis present along incision  Lab Results: Recent Labs    07/25/21 0554 07/26/21 0347  WBC 14.1* 10.6*  HGB 9.1* 8.6*  HCT 27.6* 27.2*  PLT 427* 469*   BMET:  Recent Labs    07/25/21 0554 07/26/21 0347  NA 138 139  K 3.6 4.3  CL 103 103  CO2 30 33*  GLUCOSE 121* 95  BUN 24* 15  CREATININE 0.83 0.75  CALCIUM 7.4* 7.6*    PT/INR: No results for input(s): LABPROT, INR in the last 72 hours. ABG    Component Value Date/Time   PHART 7.422 07/24/2021 0457   HCO3 33.0 (H) 07/24/2021 0457   TCO2 35 (H) 07/24/2021 0457   O2SAT 99.0 07/24/2021 0457   CBG (last 3)  Recent Labs    07/23/21 2008 07/23/21 2347 07/24/21 0454  GLUCAP 139* 148* 137*    Assessment/Plan: S/P Procedure(s) (LRB): THORACOTOMY with drainage of empyema (Left) DECORTICATION of left lung (Left)  CV- hemodynamically stable  in NSR, BP controlled  Pulm- CT output 130 cc, awaiting CXR results, if looks okay will remove chest tube today ID- ABX per primary Dispo- patient stable, await CXR results, if stable will plan to d/c chest tube today   LOS: 14 days    Ellwood Handler, PA-C 07/26/2021   Chart reviewed, patient examined, agree with above. CXR not done yet. If it is ok the chest tube can come out. WBC back to normal.

## 2021-07-26 NOTE — Progress Notes (Signed)
PROGRESS NOTE    Victor [name]  KWI:[id number] DOB: 1976/01/26 DOA: 07/11/2021 PCP: [name], DO   Chief Complaint  Patient presents with   Shortness of Breath  Brief Narrative/Hospital Course: Victor [name], 45 y.o. male with PMH of htn, hld, tobacco abuse, GERD, chronic knee pain s/p Rt TKA/multiple revisions, on home narcotics and as needed Narcan, presented to the [facility] with 1 week history of  cough, shortness of breath along with left-sided pleuritic chest pain,- found to  have pneumonia and admitted on 07/11/2021, by that time he had symptoms for 1 week already.  Despite appropriate treatment he continued to have left therapeutic chest pain CT scan demonstrated moderate to large left-sided pleural effusion around the infiltrate suspicious for parapneumonic effusion versus empyema Seen by TCTS, S/P Lt thoracotomy and decortication of left lung 11/28 with empymea drainage, manged with meropenem and chest tubes x2.  Transferred from [facility] to 4e  11/30  Subjective: Seen this morning resting comfortably has 1 chest tube in place on waterseal, had 1 removed yesterday. Afebrile overnight, WBC count significantly improved 10.6, crp down 12.7 > 6.4  Assessment & Plan:  Acute hypoxic respiratory failure due to pneumonia and pleural effusion Bacterial community-acquired pneumonia Sepsis POA 2/2 CAP Complicated left pleural effusion due to pneumonia: S/P Lt thoracotomy and decortication of left lung 11/28, with chest tubesx2. Clinically stable improving leukocytosis resolved, on RA.Continue  chest tubes maangement as per thoracic surgery team -monitor improved yesterday and hoping to remove second  today.  And appreciate input.Continue current meropenem, IS, pulmonary support, nebulizer bronchodilators.  OR culture no growth so far.   Rt TKA/multiple revisions Pain on chronic narcotics/prn Narcan spray at home: Continue pain control with IV Toradol, as needed Dilaudid IV  and oral oxy, robaxin. Minimize narcotics.  Resumeed home  amytriptan prn trazodone.  HTN well-controlled on home amlodipine  Tobacco abuse: Counseled to quit smoking.  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  Improved with [place].  Hypocalcemia/Hypokalemia-resolved  Class I Obesity:Patient's Body mass index is 33.89 kg/m. : Will benefit with PCP follow-up, weight loss  healthy lifestyle and outpatient sleep evaluation.  DVT prophylaxis: enoxaparin (LOVENOX) injection 40 mg Start: 07/24/21 0445 SCD's Start: 07/23/21 1829 Code Status:   Code Status: Full Code Family Communication: plan of care discussed with patient at bedside. Status is: Inpatient Remains inpatient appropriate because: for ongoing chest tube management, per TCTS Disposition: Currently NOT medically stable for discharge. Anticipated Disposition: home  Objective: Vitals last 24 hrs: Vitals:   07/26/21 0753 07/26/21 0829 07/26/21 0830 07/26/21 0907  BP: 128/81 128/81 128/81 131/76  Pulse: 81 87 80   Resp: 18 18 18    Temp: 98.3 F (36.8 C)  98.3 F (36.8 C)   TempSrc: Oral  Oral   SpO2: 98% 98% 98%   Weight:      Height:       Weight change:   Intake/Output Summary (Last 24 hours) at 07/26/2021 1110 Last data filed at 07/26/2021 0900 Gross per 24 hour  Intake 528.91 ml  Output 3106 ml  Net -2577.09 ml   Net IO Since Admission: -7,240.66 mL [07/26/21 1110]   Physical Examination: General exam: AAOx 3, pleasant HEENT:Oral mucosa moist, Ear/Nose WNL grossly, dentition normal. Respiratory system: bilaterally clear, chest tube in place, no use of accessory muscle Cardiovascular system: S1 & S2 +, No JVD,. Gastrointestinal system: Abdomen soft,NT,ND, BS+ Nervous  System:Alert, awake, moving extremities and grossly nonfocal Extremities: no edema, distal peripheral pulses palpable.  Skin: No rashes,no icterus. MSK: Normal muscle  bulk,tone, power   Medications reviewed:  Scheduled Meds:  amitriptyline  10 mg Oral QHS   amLODipine  5 mg Oral Daily   arformoterol  15 mcg Nebulization BID   bisacodyl  10 mg Oral Daily   budesonide (PULMICORT) nebulizer solution  0.5 mg Nebulization BID   Chlorhexidine Gluconate Cloth  6 each Topical Daily   dextromethorphan-guaiFENesin  1 tablet Oral BID   enoxaparin (LOVENOX) injection  40 mg Subcutaneous Daily   nicotine  21 mg Transdermal Daily   nystatin  5 mL Oral QID   pantoprazole  40 mg Oral BID   senna-docusate  1 tablet Oral QHS   Continuous Infusions:  sodium chloride 10 mL/hr at 07/25/21 0358   meropenem (MERREM) IV 1 g (07/26/21 0439)   Diet Order             Diet regular Room service appropriate? Yes; Fluid consistency: Thin  Diet effective now                 Weight change:   Wt Readings from Last 3 Encounters:  07/25/21 110.2 kg  07/02/21 113.9 kg  03/29/21 113 kg     Consultants:see note  Procedures:see note Antimicrobials: Anti-infectives (From admission, onward)    Start     Dose/Rate Route Frequency Ordered Stop   07/21/21 0915  meropenem (MERREM) 1 g in sodium chloride 0.9 % 100 mL IVPB        1 g 200 mL/hr over 30 Minutes Intravenous Every 8 hours 07/21/21 0815     07/19/21 1030  Ampicillin-Sulbactam (UNASYN) 3 g in sodium chloride 0.9 % 100 mL IVPB  Status:  Discontinued        3 g 200 mL/hr over 30 Minutes Intravenous Every 8 hours 07/19/21 0940 07/21/21 0801   07/19/21 1030  levofloxacin (LEVAQUIN) tablet 500 mg  Status:  Discontinued        500 mg Oral Daily 07/19/21 0940 07/21/21 1407   07/17/21 2000  cefTRIAXone (ROCEPHIN) 2 g in sodium chloride 0.9 % 100 mL IVPB  Status:  Discontinued        2 g 200 mL/hr over 30 Minutes Intravenous Every 24 hours 07/17/21 0948 07/19/21 0940   07/12/21 2200  cefTRIAXone (ROCEPHIN) 2 g in sodium chloride 0.9 % 100 mL IVPB        2 g 200 mL/hr over 30 Minutes Intravenous Every 24 hours 07/12/21  0442 07/16/21 2147   07/12/21 2200  azithromycin (ZITHROMAX) 500 mg in sodium chloride 0.9 % 250 mL IVPB        500 mg 250 mL/hr over 60 Minutes Intravenous Every 24 hours 07/12/21 0442 07/15/21 2358   07/12/21 0530  cefTRIAXone (ROCEPHIN) 1 g in sodium chloride 0.9 % 100 mL IVPB  Status:  Discontinued        1 g 200 mL/hr over 30 Minutes Intravenous Every 24 hours 07/12/21 0442 07/12/21 0445   07/11/21 2230  cefTRIAXone (ROCEPHIN) 1 g in sodium chloride 0.9 % 100 mL IVPB        1 g 200 mL/hr over 30 Minutes Intravenous  Once 07/11/21 2223 07/11/21 2341   07/11/21 2230  azithromycin (ZITHROMAX) 500 mg in sodium chloride 0.9 % 250 mL IVPB        500 mg 250 mL/hr over 60 Minutes Intravenous  Once 07/11/21 2223 07/11/21  2356      Culture/Microbiology    Component Value Date/Time   SDES FLUID 07/23/2021 1505   SDES FLUID 07/23/2021 1505   SPECREQUEST PLEURAL FLUID 07/23/2021 1505   [place] FLUID 07/23/2021 1505   CULT  07/23/2021 1505    NO FUNGUS ISOLATED AFTER 2 DAYS Performed at [facility], [place] [address], [place], [place] [address]    CULT  07/23/2021 1505    NO GROWTH 3 DAYS NO ANAEROBES ISOLATED; CULTURE IN PROGRESS FOR 5 DAYS Performed at [facility] [address] [place],  [address]    REPTSTATUS PENDING 07/23/2021 1505   REPTSTATUS PENDING 07/23/2021 1505    Other culture-see note  Unresulted Labs (From admission, onward)     Start     Ordered   07/26/21 3762  Basic metabolic panel  Daily,   R     Question:  Specimen collection method  Answer:  Lab=Lab collect   07/25/21 1010   07/25/21 0500  C-reactive protein  Daily,   R     Question:  Specimen collection method  Answer:  Lab=Lab collect   07/24/21 0933   07/25/21 0500  CBC with Differential/Platelet  Daily,   R     Question:  Specimen collection method  Answer:  Lab=Lab collect   07/24/21 0933   07/23/21 1514  Acid Fast Culture with reflexed sensitivities  RELEASE UPON  ORDERING,   TIMED       Comments: Specimen A: Phone [phone] Immunocompromised?  No  Antibiotic Treatment:  Meropenem Is the patient on airborne/droplet precautions? No Clinical History:  Left Empyema Special Instructions:  none Specimen Disposition:  Microbiology     07/23/21 1514   07/22/21 0500  CBC with Differential/Platelet  Daily,   R,   Status:  Canceled     Question:  Specimen collection method  Answer:  Lab=Lab collect   07/21/21 0800           Data Reviewed: I have personally reviewed following labs and imaging studies CBC: Recent Labs  Lab 07/21/21 0106 07/22/21 0410 07/23/21 0105 07/23/21 1709 07/23/21 1747 07/24/21 0457 07/24/21 0503 07/25/21 0554 07/26/21 0347  WBC 21.9* 25.6* 19.4*  --   --   --  27.4* 14.1* 10.6*  NEUTROABS 15.5* 21.2* 13.8*  --   --   --   --  9.5* 5.8  HGB 15.3 14.9 12.2*   < > 13.9 12.2* 11.7* 9.1* 8.6*  HCT 46.2 43.9 37.2*   < > 41.0 36.0* 36.0* 27.6* 27.2*  MCV 88.0 86.9 88.4  --   --   --  88.5 89.6 90.7  PLT 378 454* 398  --   --   --  528* 427* 469*   < > = values in this interval not displayed.   Basic Metabolic Panel: Recent Labs  Lab 07/20/21 0101 07/21/21 0106 07/22/21 0107 07/23/21 0105 07/23/21 1709 07/23/21 1747 07/24/21 0457 07/24/21 0503 07/25/21 0554 07/26/21 0347  NA 132* 132* 132* 129*   < > 135 137 134* 138 139  K 5.1 4.2 4.7 3.5   < > 4.7 4.7 4.6 3.6 4.3  CL 98 95* 96* 92*  --   --   --  99 103 103  CO2 24 28 28 30   --   --   --  30 30 33*  GLUCOSE 110* 118* 125* 104*  --   --   --  138* 121* 95  BUN 17 19  17 16  --   --   --  19 24* 15  CREATININE 0.92 0.92 0.94 0.88  --   --   --  0.90 0.83 0.75  CALCIUM 8.2* 8.1* 7.9* 7.5*  --   --   --  7.8* 7.4* 7.6*  MG 2.3 2.1 2.0 2.1  --   --   --   --  2.1  --    < > = values in this interval not displayed.   GFR: Estimated Creatinine Clearance: 144.2 mL/min (by C-G formula based on SCr of 0.75 mg/dL). Liver Function Tests: Recent Labs  Lab  07/20/21 0101 07/21/21 0106 07/22/21 0107 07/23/21 0105 07/25/21 0554  AST 50* 34 17 13* 20  ALT 148* 106* 68* 43 28  ALKPHOS 174* 164* 136* 116 110  BILITOT 0.9 0.8 0.9 0.4 0.4  PROT 6.3* 6.0* 5.8* 5.3* 4.7*  ALBUMIN 2.3* 2.0* 2.0* 1.7* 1.5*   No results for input(s): LIPASE, AMYLASE in the last 168 hours. No results for input(s): AMMONIA in the last 168 hours. Coagulation Profile: No results for input(s): INR, PROTIME in the last 168 hours. Cardiac Enzymes: No results for input(s): CKTOTAL, CKMB, CKMBINDEX, TROPONINI in the last 168 hours. BNP (last 3 results) No results for input(s): PROBNP in the last 8760 hours. HbA1C: No results for input(s): HGBA1C in the last 72 hours. CBG: Recent Labs  Lab 07/23/21 2008 07/23/21 2347 07/24/21 0454  GLUCAP 139* 148* 137*   Lipid Profile: No results for input(s): CHOL, HDL, LDLCALC, TRIG, CHOLHDL, LDLDIRECT in the last 72 hours. Thyroid Function Tests: No results for input(s): TSH, T4TOTAL, FREET4, T3FREE, THYROIDAB in the last 72 hours. Anemia Panel: No results for input(s): VITAMINB12, FOLATE, FERRITIN, TIBC, IRON, RETICCTPCT in the last 72 hours. Sepsis Labs: Recent Labs  Lab 07/23/21 0105 07/24/21 0503 07/25/21 0554 07/26/21 0347  PROCALCITON 0.20 0.28 0.22 0.13    Recent Results (from the past 240 hour(s))  Culture, fungus without smear     Status: None (Preliminary result)   Collection Time: 07/23/21  3:05 PM   Specimen: Pleural, Left; Body Fluid  Result Value Ref Range Status   Specimen Description FLUID  Final   Special Requests PLEURAL FLUID  Final   Culture   Final    NO FUNGUS ISOLATED AFTER 2 DAYS Performed at [facility], [address], [place], [place] [address]    Report Status PENDING  Incomplete  Aerobic/Anaerobic Culture w Gram Stain (surgical/deep wound)     Status: None (Preliminary result)   Collection Time: 07/23/21  3:05 PM   Specimen: Pleural, Left; Body Fluid  Result Value Ref Range  Status   Specimen Description FLUID  Final   Special Requests PLEURAL FLUID  Final   Gram Stain NO WBC SEEN NO ORGANISMS SEEN   Final   Culture   Final    NO GROWTH 3 DAYS NO ANAEROBES ISOLATED; CULTURE IN PROGRESS FOR 5 DAYS Performed at [facility], [address] [place], [place] [address]    Report Status PENDING  Incomplete  Acid Fast Smear (AFB)     Status: None   Collection Time: 07/23/21  3:05 PM   Specimen: Pleural, Left; Body Fluid  Result Value Ref Range Status   AFB Specimen Processing Concentration  Final   Acid Fast Smear Negative  Final    Comment: (NOTE) Performed At: [facility] [place], [place] [id number] Rush Farmer MD [id number]  Source (AFB) PLEURAL  Final    Comment: FLUID Performed at [facility], [place] [address], [place], [place] [address]      Radiology Studies: DG CHEST PORT 1 VIEW  Result Date: 07/25/2021 CLINICAL DATA:  Removal of 1 of 2 left chest tubes.  Empyema. EXAM: PORTABLE CHEST 1 VIEW COMPARISON:  Earlier today at 5:07 a.m. FINDINGS: 11:04 a.m.  Removal of 1 left chest tube. The right costophrenic angle is minimally excluded. Midline trachea. Normal heart size. Small left pleural effusion and pleural thickening remain. No pneumothorax. Minimal subcutaneous emphysema about the left chest wall inferiorly. Clear right lung. Mid and lower left lung airspace disease, similar. IMPRESSION: No pneumothorax after removal of 1 of 2 left chest tubes. Similar left pleural fluid and thickening with adjacent Airspace disease, likely atelectasis. Electronically Signed   By: Abigail Miyamoto M.D.   On: 07/25/2021 11:16   DG CHEST PORT 1 VIEW  Result Date: 07/25/2021 CLINICAL DATA:  Chest tube present, empyema EXAM: PORTABLE CHEST 1 VIEW COMPARISON:  07/24/2021 FINDINGS: Left chest tubes are again identified. Similar left pleural effusion. No pneumothorax. Persistent patchy left lung opacities similar to prior. Stable  cardiomediastinal contours. IMPRESSION: Similar left pleural effusion and patchy left lung opacities with chest tubes present. Electronically Signed   By: Macy Mis M.D.   On: 07/25/2021 08:30     LOS: 14 days   Antonieta Pert, MD [facility]  07/26/2021, 11:10 AM

## 2021-07-27 ENCOUNTER — Inpatient Hospital Stay (HOSPITAL_COMMUNITY): Payer: Medicaid Other

## 2021-07-27 DIAGNOSIS — J189 Pneumonia, unspecified organism: Secondary | ICD-10-CM | POA: Diagnosis not present

## 2021-07-27 LAB — C-REACTIVE PROTEIN: CRP: 3.1 mg/dL — ABNORMAL HIGH (ref <1.0)

## 2021-07-27 LAB — CBC WITH DIFFERENTIAL/PLATELET
Abs Immature Granulocytes: 0.16 10*3/uL — ABNORMAL HIGH (ref 0.00–0.07)
Basophils Absolute: 0 10*3/uL (ref 0.0–0.1)
Basophils Relative: 0 %
Eosinophils Absolute: 0.4 10*3/uL (ref 0.0–0.5)
Eosinophils Relative: 4 %
HCT: 29.7 % — ABNORMAL LOW (ref 39.0–52.0)
Hemoglobin: 9.5 g/dL — ABNORMAL LOW (ref 13.0–17.0)
Immature Granulocytes: 2 %
Lymphocytes Relative: 33 %
Lymphs Abs: 3.1 10*3/uL (ref 0.7–4.0)
MCH: 29 pg (ref 26.0–34.0)
MCHC: 32 g/dL (ref 30.0–36.0)
MCV: 90.5 fL (ref 80.0–100.0)
Monocytes Absolute: 0.9 10*3/uL (ref 0.1–1.0)
Monocytes Relative: 9 %
Neutro Abs: 4.8 10*3/uL (ref 1.7–7.7)
Neutrophils Relative %: 52 %
Platelets: 506 10*3/uL — ABNORMAL HIGH (ref 150–400)
RBC: 3.28 MIL/uL — ABNORMAL LOW (ref 4.22–5.81)
RDW: 14.5 % (ref 11.5–15.5)
WBC: 9.3 10*3/uL (ref 4.0–10.5)
nRBC: 0 % (ref 0.0–0.2)

## 2021-07-27 LAB — BASIC METABOLIC PANEL
Anion gap: 4 — ABNORMAL LOW (ref 5–15)
BUN: 13 mg/dL (ref 6–20)
CO2: 30 mmol/L (ref 22–32)
Calcium: 7.9 mg/dL — ABNORMAL LOW (ref 8.9–10.3)
Chloride: 100 mmol/L (ref 98–111)
Creatinine, Ser: 0.56 mg/dL — ABNORMAL LOW (ref 0.61–1.24)
GFR, Estimated: >60 mL/min (ref >60)
Glucose, Bld: 92 mg/dL (ref 70–99)
Potassium: 4.2 mmol/L (ref 3.5–5.1)
Sodium: 134 mmol/L — ABNORMAL LOW (ref 135–145)

## 2021-07-27 NOTE — Progress Notes (Signed)
PROGRESS NOTE    Victor [name]  DGL:[id number] DOB: 1976/01/17 DOA: 07/11/2021 PCP: [name], DO   Chief Complaint  Patient presents with   Shortness of Breath  Brief Narrative/Hospital Course: Victor [name], 45 y.o. male with PMH of htn, hld, tobacco abuse, GERD, chronic knee pain s/p Rt TKA/multiple revisions, on home narcotics and as needed Narcan, presented to the [facility] with 1 week history of  cough, shortness of breath along with left-sided pleuritic chest pain,- found to  have pneumonia and admitted on 07/11/2021, by that time he had symptoms for 1 week already.  Despite appropriate treatment he continued to have left therapeutic chest pain CT scan demonstrated moderate to large left-sided pleural effusion around the infiltrate suspicious for parapneumonic effusion versus empyema Seen by TCTS, S/P Lt thoracotomy and decortication of left lung 11/28 with empymea drainage, manged with meropenem and chest tubes x2.  Transferred from [place] to 4e  11/30  Subjective: Seen this morning alert awake, ambulating in the room. Overnight no fever. WBC count CRP significantly improved.  Now on room air  Assessment & Plan:  Acute hypoxic respiratory failure due to pneumonia and pleural effusion Bacterial community-acquired pneumonia Sepsis POA 2/2 CAP Complicated left pleural effusion due to pneumonia: S/P Lt thoracotomy and decortication of left lung 11/28, with chest tubesx2. Patient is clinically improved, has 1 chest tube in place and planning to discontinue today repeat chest x-ray this afternoon and in the a.m.  Continue plan as per TCTS appreciate input. Continue current meropenem, IS, pulmonary support, nebulizer bronchodilators.  OR culture no growth so far.  Hopefully can switch to oral antibiotics  in am.  Chest wall tube site area with a bruise  Rt TKA/multiple revisions Pain on chronic narcotics/prn Narcan spray at home: Pain is controlled continue with IV  Toradol, PRN Dilaudid IV and oral oxy, robaxin. Minimize narcotics.  Resumeed home  amytriptan prn trazodone.  HTN stable on amlodipine  Tobacco abuse: Counseled to quit smoking.  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  Improved with [place].  Hypocalcemia/Hypokalemia-resolved  Class I Obesity:Patient's Body mass index is 33.89 kg/m. : Will benefit with PCP follow-up, weight loss  healthy lifestyle and outpatient sleep evaluation.  DVT prophylaxis: enoxaparin (LOVENOX) injection 40 mg Start: 07/24/21 0445 SCD's Start: 07/23/21 1829 Code Status:   Code Status: Full Code Family Communication: plan of care discussed with patient at bedside. Status is: Inpatient Remains inpatient appropriate because: for ongoing chest tube management, per TCTS Disposition: Currently NOT medically stable for discharge. Anticipated Disposition: home once cleared by TCTS  Objective: Vitals last 24 hrs: Vitals:   07/26/21 2010 07/26/21 2349 07/27/21 0335 07/27/21 0729  BP: 124/74 113/70 133/77 132/76  Pulse: 81 68 70 76  Resp: 14 13 16 14   Temp: 98.3 F (36.8 C) 98.6 F (37 C) 98.6 F (37 C) 98.4 F (36.9 C)  TempSrc: Oral Oral Oral Oral  SpO2: 98% 98% 90% 92%  Weight:      Height:       Weight change:   Intake/Output Summary (Last 24 hours) at 07/27/2021 0750 Last data filed at 07/27/2021 5188 Gross per 24 hour  Intake 600 ml  Output 2795 ml  Net -2195 ml    Net IO Since Admission: -8,660.66 mL [07/27/21 0750]   Physical Examination: General exam: AAOx 3, older than stated age, weak appearing. HEENT:Oral mucosa moist, Ear/Nose WNL grossly,  dentition normal. Respiratory system: bilaterally diminished at base, chest tube in place with bruise and surrounding area, Cardiovascular system: S1 & S2 +, No JVD,. Gastrointestinal system: Abdomen soft, NT,ND, BS+ Nervous System:Alert, awake, moving extremities and  grossly nonfocal Extremities: no edema, distal peripheral pulses palpable.  Skin: No rashes,no icterus. MSK: Normal muscle bulk,tone, power   Medications reviewed:  Scheduled Meds:  amitriptyline  10 mg Oral QHS   amLODipine  5 mg Oral Daily   arformoterol  15 mcg Nebulization BID   bisacodyl  10 mg Oral Daily   budesonide (PULMICORT) nebulizer solution  0.5 mg Nebulization BID   Chlorhexidine Gluconate Cloth  6 each Topical Daily   dextromethorphan-guaiFENesin  1 tablet Oral BID   enoxaparin (LOVENOX) injection  40 mg Subcutaneous Daily   nicotine  21 mg Transdermal Daily   nystatin  5 mL Oral QID   pantoprazole  40 mg Oral BID   senna-docusate  1 tablet Oral QHS   Continuous Infusions:  sodium chloride 10 mL/hr at 07/25/21 0358   meropenem (MERREM) IV 1 g (07/27/21 0403)   Diet Order             Diet regular Room service appropriate? Yes; Fluid consistency: Thin  Diet effective now                 Weight change:   Wt Readings from Last 3 Encounters:  07/25/21 110.2 kg  07/02/21 113.9 kg  03/29/21 113 kg     Consultants:see note  Procedures:see note Antimicrobials: Anti-infectives (From admission, onward)    Start     Dose/Rate Route Frequency Ordered Stop   07/21/21 0915  meropenem (MERREM) 1 g in sodium chloride 0.9 % 100 mL IVPB        1 g 200 mL/hr over 30 Minutes Intravenous Every 8 hours 07/21/21 0815     07/19/21 1030  Ampicillin-Sulbactam (UNASYN) 3 g in sodium chloride 0.9 % 100 mL IVPB  Status:  Discontinued        3 g 200 mL/hr over 30 Minutes Intravenous Every 8 hours 07/19/21 0940 07/21/21 0801   07/19/21 1030  levofloxacin (LEVAQUIN) tablet 500 mg  Status:  Discontinued        500 mg Oral Daily 07/19/21 0940 07/21/21 1407   07/17/21 2000  cefTRIAXone (ROCEPHIN) 2 g in sodium chloride 0.9 % 100 mL IVPB  Status:  Discontinued        2 g 200 mL/hr over 30 Minutes Intravenous Every 24 hours 07/17/21 0948 07/19/21 0940   07/12/21 2200  cefTRIAXone  (ROCEPHIN) 2 g in sodium chloride 0.9 % 100 mL IVPB        2 g 200 mL/hr over 30 Minutes Intravenous Every 24 hours 07/12/21 0442 07/16/21 2147   07/12/21 2200  azithromycin (ZITHROMAX) 500 mg in sodium chloride 0.9 % 250 mL IVPB        500 mg 250 mL/hr over 60 Minutes Intravenous Every 24 hours 07/12/21 0442 07/15/21 2358   07/12/21 0530  cefTRIAXone (ROCEPHIN) 1 g in sodium chloride 0.9 % 100 mL IVPB  Status:  Discontinued        1 g 200 mL/hr over 30 Minutes Intravenous Every 24 hours 07/12/21 0442 07/12/21 0445   07/11/21 2230  cefTRIAXone (ROCEPHIN) 1 g in sodium chloride 0.9 % 100 mL IVPB        1 g 200 mL/hr over 30 Minutes Intravenous  Once 07/11/21 2223 07/11/21 2341   07/11/21 2230  azithromycin (ZITHROMAX) 500 mg in sodium chloride 0.9 % 250 mL IVPB        500 mg 250 mL/hr over 60 Minutes Intravenous  Once 07/11/21 2223 07/11/21 2356      Culture/Microbiology    Component Value Date/Time   SDES FLUID 07/23/2021 1505   SDES FLUID 07/23/2021 1505   [place] FLUID 07/23/2021 1505   [place] FLUID 07/23/2021 1505   CULT  07/23/2021 1505    NO FUNGUS ISOLATED AFTER 3 DAYS Performed at [facility] [address], [place], [place] [address]    CULT  07/23/2021 1505    NO GROWTH 3 DAYS NO ANAEROBES ISOLATED; CULTURE IN PROGRESS FOR 5 DAYS Performed at [facility] [address], [place], [place] [address]    REPTSTATUS PENDING 07/23/2021 1505   REPTSTATUS PENDING 07/23/2021 1505    Other culture-see note  Unresulted Labs (From admission, onward)     Start     Ordered   07/26/21 8527  Basic metabolic panel  Daily,   R     Question:  Specimen collection method  Answer:  Lab=Lab collect   07/25/21 1010   07/25/21 0500  C-reactive protein  Daily,   R     Question:  Specimen collection method  Answer:  Lab=Lab collect   07/24/21 0933   07/25/21 0500  CBC with Differential/Platelet  Daily,   R     Question:  Specimen collection method   Answer:  Lab=Lab collect   07/24/21 0933   07/23/21 1514  Acid Fast Culture with reflexed sensitivities  RELEASE UPON ORDERING,   TIMED       Comments: Specimen A: Phone [phone] Immunocompromised?  No  Antibiotic Treatment:  Meropenem Is the patient on airborne/droplet precautions? No Clinical History:  Left Empyema Special Instructions:  none Specimen Disposition:  Microbiology     07/23/21 1514   07/22/21 0500  CBC with Differential/Platelet  Daily,   R,   Status:  Canceled     Question:  Specimen collection method  Answer:  Lab=Lab collect   07/21/21 0800           Data Reviewed: I have personally reviewed following labs and imaging studies CBC: Recent Labs  Lab 07/22/21 0410 07/23/21 0105 07/23/21 1709 07/24/21 0457 07/24/21 0503 07/25/21 0554 07/26/21 0347 07/27/21 0251  WBC 25.6* 19.4*  --   --  27.4* 14.1* 10.6* 9.3  NEUTROABS 21.2* 13.8*  --   --   --  9.5* 5.8 4.8  HGB 14.9 12.2*   < > 12.2* 11.7* 9.1* 8.6* 9.5*  HCT 43.9 37.2*   < > 36.0* 36.0* 27.6* 27.2* 29.7*  MCV 86.9 88.4  --   --  88.5 89.6 90.7 90.5  PLT 454* 398  --   --  528* 427* 469* 506*   < > = values in this interval not displayed.    Basic Metabolic Panel: Recent Labs  Lab 07/21/21 0106 07/22/21 0107 07/23/21 0105 07/23/21 1709 07/24/21 0457 07/24/21 0503 07/25/21 0554 07/26/21 0347 07/27/21 0251  NA 132* 132* 129*   < > 137 134* 138 139 134*  K 4.2 4.7 3.5   < > 4.7 4.6 3.6 4.3 4.2  CL 95* 96* 92*  --   --  99 103 103 100  CO2 28 28 30   --   --  30 30 33* 30  GLUCOSE 118* 125* 104*  --   --  138* 121* 95 92  BUN 19 17 16   --   --  19 24* 15 13  CREATININE 0.92 0.94 0.88  --   --  0.90 0.83 0.75 0.56*  CALCIUM 8.1* 7.9* 7.5*  --   --  7.8* 7.4* 7.6* 7.9*  MG 2.1 2.0 2.1  --   --   --  2.1  --   --    < > = values in this interval not displayed.    GFR: Estimated Creatinine Clearance: 144.2 mL/min (A) (by C-G formula based on SCr of 0.56 mg/dL (L)). Liver Function  Tests: Recent Labs  Lab 07/21/21 0106 07/22/21 0107 07/23/21 0105 07/25/21 0554  AST 34 17 13* 20  ALT 106* 68* 43 28  ALKPHOS 164* 136* 116 110  BILITOT 0.8 0.9 0.4 0.4  PROT 6.0* 5.8* 5.3* 4.7*  ALBUMIN 2.0* 2.0* 1.7* 1.5*    No results for input(s): LIPASE, AMYLASE in the last 168 hours. No results for input(s): AMMONIA in the last 168 hours. Coagulation Profile: No results for input(s): INR, PROTIME in the last 168 hours. Cardiac Enzymes: No results for input(s): CKTOTAL, CKMB, CKMBINDEX, TROPONINI in the last 168 hours. BNP (last 3 results) No results for input(s): PROBNP in the last 8760 hours. HbA1C: No results for input(s): HGBA1C in the last 72 hours. CBG: Recent Labs  Lab 07/23/21 2008 07/23/21 2347 07/24/21 0454  GLUCAP 139* 148* 137*    Lipid Profile: No results for input(s): CHOL, HDL, LDLCALC, TRIG, CHOLHDL, LDLDIRECT in the last 72 hours. Thyroid Function Tests: No results for input(s): TSH, T4TOTAL, FREET4, T3FREE, THYROIDAB in the last 72 hours. Anemia Panel: No results for input(s): VITAMINB12, FOLATE, FERRITIN, TIBC, IRON, RETICCTPCT in the last 72 hours. Sepsis Labs: Recent Labs  Lab 07/23/21 0105 07/24/21 0503 07/25/21 0554 07/26/21 0347  PROCALCITON 0.20 0.28 0.22 0.13     Recent Results (from the past 240 hour(s))  Culture, fungus without smear     Status: None (Preliminary result)   Collection Time: 07/23/21  3:05 PM   Specimen: Pleural, Left; Body Fluid  Result Value Ref Range Status   Specimen Description FLUID  Final   Special Requests PLEURAL FLUID  Final   Culture   Final    NO FUNGUS ISOLATED AFTER 3 DAYS Performed at [facility], [address], [place], [place] [address]    Report Status PENDING  Incomplete  Aerobic/Anaerobic Culture w Gram Stain (surgical/deep wound)     Status: None (Preliminary result)   Collection Time: 07/23/21  3:05 PM   Specimen: Pleural, Left; Body Fluid  Result Value Ref Range Status    Specimen Description FLUID  Final   Special Requests PLEURAL FLUID  Final   Gram Stain NO WBC SEEN NO ORGANISMS SEEN   Final   Culture   Final    NO GROWTH 3 DAYS NO ANAEROBES ISOLATED; CULTURE IN PROGRESS FOR 5 DAYS Performed at [facility], [address], [place], [place]  [address]    Report Status PENDING  Incomplete  Acid Fast Smear (AFB)     Status: None   Collection Time: 07/23/21  3:05 PM   Specimen: Pleural, Left; Body Fluid  Result Value Ref Range Status   AFB Specimen Processing Concentration  Final   Acid Fast Smear Negative  Final    Comment: (NOTE) Performed At: [facility] [place], [place] [id number] Rush Farmer MD [id number]    Source (AFB) PLEURAL  Final    Comment:  FLUID Performed at [facility], [place] [address], [place], [place] [address]       Radiology Studies: DG CHEST PORT 1 VIEW  Result Date: 07/26/2021 CLINICAL DATA:  Empyema drainage EXAM: PORTABLE CHEST 1 VIEW COMPARISON:  07/25/2021 FINDINGS: Left chest tube unchanged in position. Pleural thickening in the left lung base and lateral chest wall unchanged. No pneumothorax. Left lower lobe consolidation unchanged Right lung remains clear. IMPRESSION: No interval change.  Left chest tube in place.  No pneumothorax. Electronically Signed   By: Franchot Gallo M.D.   On: 07/26/2021 14:25   DG CHEST PORT 1 VIEW  Result Date: 07/25/2021 CLINICAL DATA:  Removal of 1 of 2 left chest tubes.  Empyema. EXAM: PORTABLE CHEST 1 VIEW COMPARISON:  Earlier today at 5:07 a.m. FINDINGS: 11:04 a.m.  Removal of 1 left chest tube. The right costophrenic angle is minimally excluded. Midline trachea. Normal heart size. Small left pleural effusion and pleural thickening remain. No pneumothorax. Minimal subcutaneous emphysema about the left chest wall inferiorly. Clear right lung. Mid and lower left lung airspace disease, similar. IMPRESSION: No pneumothorax after removal of 1 of 2 left  chest tubes. Similar left pleural fluid and thickening with adjacent Airspace disease, likely atelectasis. Electronically Signed   By: Abigail Miyamoto M.D.   On: 07/25/2021 11:16     LOS: 15 days   Antonieta Pert, MD [facility]  07/27/2021, 7:50 AM

## 2021-07-27 NOTE — Progress Notes (Signed)
      [address]       [place], [address]             [phone]      4 Days Post-Op Procedure(s) (LRB): THORACOTOMY with drainage of empyema (Left) DECORTICATION of left lung (Left)  Subjective:  No new complaints.  States they said something was wrong with his incision site.  Objective: Vital signs in last 24 hours: Temp:  [98 F (36.7 C)-98.6 F (37 C)] 98.6 F (37 C) (12/02 0335) Pulse Rate:  [68-87] 70 (12/02 0335) Cardiac Rhythm: Normal sinus rhythm (12/01 1918) Resp:  [13-18] 16 (12/02 0335) BP: (113-133)/(70-81) 133/77 (12/02 0335) SpO2:  [90 %-98 %] 90 % (12/02 0335)  Intake/Output from previous day: 12/01 0701 - 12/02 0700 In: 600 [P.O.:600] Out: 3295 [Urine:3225; Chest Tube:70]  General appearance: alert, cooperative, and no distress Heart: regular rate and rhythm Lungs: diminished on left Abdomen: soft, non-tender; bowel sounds normal; no masses,  no organomegaly Extremities: extremities normal, atraumatic, no cyanosis or edema Wound: clean and dry, more ecchymosis along left flank, however wound remains unchanged from my assessment yesterday morning  Lab Results: Recent Labs    07/26/21 0347 07/27/21 0251  WBC 10.6* 9.3  HGB 8.6* 9.5*  HCT 27.2* 29.7*  PLT 469* 506*   BMET:  Recent Labs    07/26/21 0347 07/27/21 0251  NA 139 134*  K 4.3 4.2  CL 103 100  CO2 33* 30  GLUCOSE 95 92  BUN 15 13  CREATININE 0.75 0.56*  CALCIUM 7.6* 7.9*    PT/INR: No results for input(s): LABPROT, INR in the last 72 hours. ABG    Component Value Date/Time   PHART 7.422 07/24/2021 0457   HCO3 33.0 (H) 07/24/2021 0457   TCO2 35 (H) 07/24/2021 0457   O2SAT 99.0 07/24/2021 0457   CBG (last 3)  No results for input(s): GLUCAP in the last 72 hours.  Assessment/Plan: S/P Procedure(s) (LRB): THORACOTOMY with drainage of empyema (Left) DECORTICATION of left lung (Left)  CV- hemodynamically stable Pulm- CT 70 cc output, surgical  incision remains unchanged, will d/c chest tube today ID- remains afebrile, leukocytosis improved, OR cultures are negative, ABX per primary   LOS: 15 days    Ellwood Handler, PA-C 07/27/2021

## 2021-07-28 ENCOUNTER — Inpatient Hospital Stay (HOSPITAL_COMMUNITY): Payer: Medicaid Other

## 2021-07-28 LAB — BASIC METABOLIC PANEL
Anion gap: 6 (ref 5–15)
BUN: 14 mg/dL (ref 6–20)
CO2: 31 mmol/L (ref 22–32)
Calcium: 8.2 mg/dL — ABNORMAL LOW (ref 8.9–10.3)
Chloride: 98 mmol/L (ref 98–111)
Creatinine, Ser: 0.88 mg/dL (ref 0.61–1.24)
GFR, Estimated: >60 mL/min (ref >60)
Glucose, Bld: 97 mg/dL (ref 70–99)
Potassium: 4.4 mmol/L (ref 3.5–5.1)
Sodium: 135 mmol/L (ref 135–145)

## 2021-07-28 LAB — CBC WITH DIFFERENTIAL/PLATELET
Abs Immature Granulocytes: 0.13 10*3/uL — ABNORMAL HIGH (ref 0.00–0.07)
Basophils Absolute: 0.1 10*3/uL (ref 0.0–0.1)
Basophils Relative: 1 %
Eosinophils Absolute: 0.4 10*3/uL (ref 0.0–0.5)
Eosinophils Relative: 4 %
HCT: 31.9 % — ABNORMAL LOW (ref 39.0–52.0)
Hemoglobin: 10.5 g/dL — ABNORMAL LOW (ref 13.0–17.0)
Immature Granulocytes: 1 %
Lymphocytes Relative: 30 %
Lymphs Abs: 3.1 10*3/uL (ref 0.7–4.0)
MCH: 29.5 pg (ref 26.0–34.0)
MCHC: 32.9 g/dL (ref 30.0–36.0)
MCV: 89.6 fL (ref 80.0–100.0)
Monocytes Absolute: 0.9 10*3/uL (ref 0.1–1.0)
Monocytes Relative: 9 %
Neutro Abs: 5.7 10*3/uL (ref 1.7–7.7)
Neutrophils Relative %: 55 %
Platelets: 538 10*3/uL — ABNORMAL HIGH (ref 150–400)
RBC: 3.56 MIL/uL — ABNORMAL LOW (ref 4.22–5.81)
RDW: 14.6 % (ref 11.5–15.5)
WBC: 10.3 10*3/uL (ref 4.0–10.5)
nRBC: 0 % (ref 0.0–0.2)

## 2021-07-28 LAB — AEROBIC/ANAEROBIC CULTURE W GRAM STAIN (SURGICAL/DEEP WOUND)
Culture: NO GROWTH
Gram Stain: NONE SEEN

## 2021-07-28 LAB — C-REACTIVE PROTEIN: CRP: 2.3 mg/dL — ABNORMAL HIGH (ref <1.0)

## 2021-07-28 MED ORDER — AMOXICILLIN-POT CLAVULANATE 875-125 MG PO TABS: 1.0000 | ORAL_TABLET | Freq: Two times a day (BID) | ORAL | 0 refills | Status: AC

## 2021-07-28 MED ORDER — ALBUTEROL SULFATE HFA 108 (90 BASE) MCG/ACT IN AERS: 2.0000 | INHALATION_SPRAY | Freq: Four times a day (QID) | RESPIRATORY_TRACT | 2 refills | Status: DC | PRN

## 2021-07-28 NOTE — Discharge Summary (Signed)
Physician Discharge Summary  Victor [name] BHA:[id number] DOB: Jun 08, 1976 DOA: 07/11/2021  PCP: [name], DO  Admit date: 07/11/2021 Discharge date: 07/28/2021  Admitted From: home Disposition:  home  Recommendations for Outpatient Follow-up:  Follow up with PCP in 1-2 weeks Please obtain BMP/CBC in one week  Home Health:no  Equipment/Devices: none  Discharge Condition: Stable Code Status:   Code Status: Full Code Diet recommendation:  Diet Order             Diet regular Room service appropriate? Yes; Fluid consistency: Thin  Diet effective now                    Brief/Interim Summary:  45 y.o. male with PMH of htn, hld, tobacco abuse, GERD, chronic knee pain s/p Rt TKA/multiple revisions, on home narcotics and as needed Narcan, presented to the [facility] with 1 week history of  cough, shortness of breath along with left-sided pleuritic chest pain,- found to  have pneumonia and admitted on 07/11/2021, by that time he had symptoms for 1 week already.  Despite appropriate treatment he continued to have left therapeutic chest pain CT scan demonstrated moderate to large left-sided pleural effusion around the infiltrate suspicious for parapneumonic effusion versus empyema Seen by TCTS, S/P Lt thoracotomy and decortication of left lung 11/28 with empymea drainage, manged with meropenem and chest tubes x2.  Transferred from [place] to 4e  11/30. He wad managed with iv meropenem from 07/21/21.Was un rocephin 1/16-11/24, Pleural fluid culture no growth.Clinically improved, wbc normal, all tubes are out last one removed 12/2. Spoke w/ Dr Kipp Brood and okay for discharge  Discharge Diagnoses:   Acute hypoxic respiratory failure due to pneumonia and pleural effusion Bacterial community-acquired pneumonia Sepsis POA 2/2 CAP Complicated left pleural effusion due to pneumonia: S/P Lt thoracotomy and decortication of left lung 11/28, with chest tubesx2. Patient is  clinically improved,has 1 chest tube in place and planning to discontinue today repeat chest x-ray this afternoon and in the a.m.Continue plan as per TCTS appreciate input. Continue current meropenem, IS, pulmonary support, nebulizer bronchodilators.  OR culture no growth so far.Hopefully can switch to oral antibiotics  in am.  Chest wall tube site area with a bruise I discussed with Dr Linus Salmons advised augmentin x 2 wk he has cxr and f/u right after that and TCTS will decide further course.  I discussed this plan of care with thoracic surgery team as well   Rt TKA/Multiple revisions. Pain on chronic narcotics/prn Narcan spray at home: Pain is controlled continue with IV Toradol, PRN Dilaudid IV and oral oxy, robaxin. Minimize narcotics.  Resumeed home  amytriptan prn trazodone.   HTN stable on amlodipine   Tobacco abuse:Counseled to quit smoking.   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  Improved with [place].   Hypocalcemia/Hypokalemia-resolved   Class I Obesity:Patient's Body mass index is 33.89 kg/m. : Will benefit with PCP follow-up, weight loss  healthy lifestyle and outpatient sleep evaluation.  Consults: Pccm TCTS  ID  Subjective: AAOX3, on ra, eaager to go home today Discharge Exam: Vitals:   07/28/21 0734 07/28/21 0738  BP:    Pulse:    Resp:    Temp:    SpO2: 90% 96%   General: Pt is alert, awake, not in acute distress Cardiovascular: RRR, S1/S2 +, no rubs, no gallops Respiratory: CTA bilaterally, no wheezing, no rhonchi Abdominal: Soft, NT,  ND, bowel sounds + Extremities: no edema, no cyanosis  Discharge Instructions  Discharge Instructions     Discharge instructions   Complete by: As directed    Follow up PCP  in 1 week Cbc/bmp/cxr  in 1 wk Follow up with thoracic surgery as instructed.  Please call call MD or return to ER for similar or worsening recurring problem that  brought you to hospital or if any fever,nausea/vomiting,abdominal pain, uncontrolled pain, chest pain,  shortness of breath or any other alarming symptoms.  Please follow-up your doctor as instructed in a week time and call the office for appointment.  Please avoid alcohol, smoking, or any other illicit substance and maintain healthy habits including taking your regular medications as prescribed.  You were cared for by a hospitalist during your hospital stay. If you have any questions about your discharge medications or the care you received while you were in the hospital after you are discharged, you can call the unit and ask to speak with the hospitalist on call if the hospitalist that took care of you is not available.  Once you are discharged, your primary care physician will handle any further medical issues. Please note that NO REFILLS for any discharge medications will be authorized once you are discharged, as it is imperative that you return to your primary care physician (or establish a relationship with a primary care physician if you do not have one) for your aftercare needs so that they can reassess your need for medications and monitor your lab values   Discharge wound care:   Complete by: As directed    Continue with dressing change and wound care as per thoracic surgery   Increase activity slowly   Complete by: As directed       Allergies as of 07/28/2021   No Known Allergies      Medication List     STOP taking these medications    aspirin 325 MG EC tablet   promethazine-dextromethorphan 6.25-15 MG/5ML syrup Commonly known as: PROMETHAZINE-DM       TAKE these medications    albuterol 108 (90 Base) MCG/ACT inhaler Commonly known as: VENTOLIN HFA Inhale 2 puffs into the lungs every 6 (six) hours as needed for wheezing or shortness of breath.   amitriptyline 10 MG tablet Commonly known as: ELAVIL Take 10 mg by mouth at bedtime.   amLODipine 10 MG  tablet Commonly known as: NORVASC Take 1 tablet (10 mg total) by mouth daily.   amoxicillin-clavulanate 875-125 MG tablet Commonly known as: Augmentin Take 1 tablet by mouth 2 (two) times daily for 14 days.   cholecalciferol 25 MCG (1000 UNIT) tablet Commonly known as: VITAMIN D3 Take 1,000 Units by mouth daily.   docusate sodium 100 MG capsule Commonly known as: COLACE Take 100 mg by mouth daily.   methocarbamol 500 MG tablet Commonly known as: ROBAXIN Take 1 tablet (500 mg total) by mouth every 6 (six) hours as needed for muscle spasms.   naloxone 4 MG/0.1ML Liqd nasal spray kit Commonly known as: NARCAN   oxyCODONE-acetaminophen 7.5-325 MG tablet Commonly known as: PERCOCET Take 1 tablet by mouth every 6 (six) hours as needed for severe pain.   traZODone 50 MG tablet Commonly known as: DESYREL Take 50 mg by mouth at bedtime.               Discharge Care Instructions  (From admission, onward)           Start  Ordered   07/28/21 0000  Discharge wound care:       Comments: Continue with dressing change and wound care as per thoracic surgery   07/28/21 1214            Follow-up Information     [facility] [place] Follow up on 08/22/2021.   Specialty: Cardiothoracic Surgery Why: Appointment is at 3:00, please get CXR 30 min prior to your appointment time Contact information: [place], [place], [place], DO Follow up in 1 week(s).   Specialty: Family Medicine Contact information: [place] [address] [phone]         Satira Sark, MD .   Specialty: Cardiology Contact information: [place] [address] [phone]                No Known Allergies  The results of significant diagnostics from this hospitalization (including imaging, microbiology, ancillary and  laboratory) are listed below for reference.    Microbiology: Recent Results (from the past 240 hour(s))  Culture, fungus without smear     Status: None (Preliminary result)   Collection Time: 07/23/21  3:05 PM   Specimen: Pleural, Left; Body Fluid  Result Value Ref Range Status   Specimen Description FLUID  Final   Special Requests PLEURAL FLUID  Final   Culture   Final    NO GROWTH 4 DAYS Performed at [facility], [address], [place], [place] [address]    Report Status PENDING  Incomplete  Aerobic/Anaerobic Culture w Gram Stain (surgical/deep wound)     Status: None (Preliminary result)   Collection Time: 07/23/21  3:05 PM   Specimen: Pleural, Left; Body Fluid  Result Value Ref Range Status   Specimen Description FLUID  Final   Special Requests PLEURAL FLUID  Final   Gram Stain NO WBC SEEN NO ORGANISMS SEEN   Final   Culture   Final    NO GROWTH 4 DAYS NO ANAEROBES ISOLATED; CULTURE IN PROGRESS FOR 5 DAYS Performed at [facility], [address], [place], [place] [address]    Report Status PENDING  Incomplete  Acid Fast Smear (AFB)     Status: None   Collection Time: 07/23/21  3:05 PM   Specimen: Pleural, Left; Body Fluid  Result Value Ref Range Status   AFB Specimen Processing Concentration  Final   Acid Fast Smear Negative  Final    Comment: (NOTE) Performed At: [facility] [place], [place] [id number] Rush Farmer MD WN:[id number]    Source (AFB) PLEURAL  Final    Comment: FLUID Performed at [facility], [place] [address] [place], [place] [address]     Procedures/Studies: CT ABDOMEN PELVIS WO CONTRAST  Result Date: 07/12/2021 CLINICAL DATA:  Abdominal pain and sepsis. Left-sided abdominal pain since last evening. History of prior appendectomy. EXAM: CT ABDOMEN AND PELVIS WITHOUT CONTRAST TECHNIQUE: Multidetector CT imaging of the abdomen and pelvis was performed following the standard protocol without IV contrast.  COMPARISON:  Chest CT from yesterday. FINDINGS: Lower chest: Progressive dense left lower lobe airspace opacification most likely lobar pneumonia with an associated moderate-sized parapneumonic effusion, increased since yesterday's study. Progressive right basilar atelectasis. Hepatobiliary: No hepatic lesions or intrahepatic biliary dilatation. High attenuation material in the gallbladder likely vicarious excretion from the chest CT from yesterday.  There appear to be some filling defects also suggesting gallstones. No CT findings to suggest acute cholecystitis. No common bile duct dilatation. Pancreas: No mass, inflammation or ductal dilatation. Spleen: Normal size.  No focal lesions. Adrenals/Urinary Tract: Adrenal glands and kidneys are grossly normal. No renal or obstructing ureteral calculi. No bladder calculi. No mass lesions. The bladder contains high attenuation material likely excreted contrast material from yesterday CT scan. Stomach/Bowel: The stomach, duodenum, small bowel and colon are unremarkable. No acute inflammatory changes, mass lesions or obstructive findings. Vascular/Lymphatic: The aorta is normal in caliber. Minimal scattered atheroscerlotic calcifications. No mesenteric of retroperitoneal mass or adenopathy. Small scattered lymph nodes are noted. Reproductive: The prostate gland and seminal vesicles are unremarkable. Other: Small periumbilical abdominal wall hernia containing fat. No free air or free fluid in the abdomen/pelvis. Musculoskeletal: No significant bony findings. IMPRESSION: 1. Progressive dense left lower lobe airspace opacification most likely lobar pneumonia with an associated moderate-sized parapneumonic effusion, increased since yesterday's study. 2. Progressive right basilar atelectasis. 3. No acute abdominal/pelvic findings, mass lesions or adenopathy. 4. Cholelithiasis. Electronically Signed   By: Marijo Sanes M.D.   On: 07/12/2021 14:48   DG Chest 2 View  Result  Date: 07/28/2021 CLINICAL DATA:  Chest tube removal. EXAM: CHEST - 2 VIEW COMPARISON:  07/27/2021 FINDINGS: 0530 hours. Lateral left pneumothorax is similar to prior with left base collapse/consolidative opacity and minimal left pleural thickening/fluid. Right lung remains clear. The cardiopericardial silhouette is within normal limits for size. The visualized bony structures of the thorax show no acute abnormality. Telemetry leads overlie the chest. IMPRESSION: Stable exam. Lateral left pneumothorax with left base collapse/consolidative opacity and minimal left pleural thickening/fluid. Electronically Signed   By: Misty Stanley M.D.   On: 07/28/2021 08:07   DG Chest 2 View  Result Date: 07/27/2021 CLINICAL DATA:  Empyema. EXAM: CHEST - 2 VIEW COMPARISON:  07/26/2021 FINDINGS: A left chest tube is unchanged in position. Subcutaneous emphysema in the left chest wall has mildly increased. The cardiomediastinal silhouette is unchanged. There is a persistent small left pleural effusion/pleural thickening. There are patchy airspace opacities in the left lower lung which have improved. A trace pneumothorax is not excluded laterally in the left mid to lower hemithorax. The right lung is clear. IMPRESSION: Persistent small left pleural effusion/pleural thickening with improved aeration of the left lung base. Trace left pneumothorax not excluded. Electronically Signed   By: Logan Bores M.D.   On: 07/27/2021 08:00   DG Chest 2 View  Result Date: 07/11/2021 CLINICAL DATA:  Shortness of breath and oxygen desaturation. EXAM: CHEST - 2 VIEW COMPARISON:  PA Lat 02/16/2019. FINDINGS: The cardiac size is normal. Central vessels are normal caliber. There is interval low inspiration and development of a moderate layering left pleural effusion with overlying atelectasis or consolidation of the left lower lung field. The hypoexpanded right lung is clear. No pneumothorax is seen. Thoracic cage is intact. IMPRESSION: New finding  of moderate left pleural effusion with overlying atelectasis or consolidation in left lower lung field. Clinical correlation and follow-up recommended. Electronically Signed   By: Telford Nab M.D.   On: 07/11/2021 22:04   CT CHEST W CONTRAST  Result Date: 07/22/2021 CLINICAL DATA:  Old male with history of empyema.  Follow-up study. EXAM: CT CHEST WITH CONTRAST TECHNIQUE: Multidetector CT imaging of the chest was performed during intravenous contrast administration. CONTRAST:  42m OMNIPAQUE IOHEXOL 300 MG/ML  SOLN COMPARISON:  Chest CT 07/18/2021. FINDINGS: Cardiovascular: Heart size is normal.  There is no significant pericardial fluid, thickening or pericardial calcification. There is aortic atherosclerosis, as well as atherosclerosis of the great vessels of the mediastinum and the coronary arteries, including calcified atherosclerotic plaque in the left anterior descending coronary artery. Mediastinum/Nodes: No pathologically enlarged mediastinal or hilar lymph nodes. Esophagus is unremarkable in appearance. No axillary lymphadenopathy. Lungs/Pleura: Compared to the prior examination there has been interval placement of a small bore pigtail drainage catheter in the base of the left hemithorax. Previously noted loculated pleural fluid collection appears increased in size compared to the prior examination. Small amount of gas now present within the pleural cavity Suchan be iatrogenic. Extensive passive atelectasis in the left lung, including complete atelectasis of the left lower lobe which was previously partially aerated. There is a small amount of atelectasis and probable airspace consolidation now lying dependently in the right lower lobe as well. No significant right pleural fluid. Upper Abdomen: Unremarkable. Musculoskeletal: There are no aggressive appearing lytic or blastic lesions noted in the visualized portions of the skeleton. IMPRESSION: 1. Interval placement of pigtail drainage catheter in large  loculated left pleural effusion. This effusion appears to of increased slightly in size compared to the prior examination, with worsening atelectasis in the left lung. 2. New area of atelectasis and some airspace consolidation in the right lower lobe, concerning for developing pneumonia or sequela of aspiration. 3. Aortic atherosclerosis, in addition to left anterior descending coronary artery disease. Please note that although the presence of coronary artery calcium documents the presence of coronary artery disease, the severity of this disease and any potential stenosis cannot be assessed on this non-gated CT examination. Assessment for potential risk factor modification, dietary therapy or pharmacologic therapy Michaux be warranted, if clinically indicated. Aortic Atherosclerosis (ICD10-I70.0). Electronically Signed   By: Vinnie Langton M.D.   On: 07/22/2021 12:47   CT CHEST W CONTRAST  Result Date: 07/18/2021 CLINICAL DATA:  Left-sided chest pain, shortness of breath, productive cough EXAM: CT CHEST WITH CONTRAST TECHNIQUE: Multidetector CT imaging of the chest was performed during intravenous contrast administration. CONTRAST:  9m OMNIPAQUE IOHEXOL 300 MG/ML  SOLN COMPARISON:  Chest radiograph most recently dated the same day, CT a chest 07/11/2021 FINDINGS: Cardiovascular: The heart is not enlarged. There is a trace pericardial effusion, new since 07/11/2021. The major vessels of the chest are unremarkable. Mediastinum/Nodes: The thyroid is unremarkable. The esophagus is grossly unremarkable. There is no mediastinal, hilar, or axillary lymphadenopathy. Lungs/Pleura: The trachea and central airways are patent. There is a moderate size left pleural effusion with hypoenhancing left basilar consolidation. Compared to the study from 07/11/2021, these findings are slightly worsened. The right lung is essentially clear with minimal linear opacities in the right base likely reflecting atelectasis. There is no  pneumothorax. Upper Abdomen: The imaged portions of the upper abdominal viscera are unremarkable. Musculoskeletal: There is no acute osseous abnormality or aggressive osseous lesion. IMPRESSION: 1. Moderate size left pleural effusion with adjacent consolidation concerning for pneumonia, slightly worsened since the prior CT chest from 07/11/2021. Recommend continued imaging follow-up to resolution. 2. New trace pericardial effusion. Electronically Signed   By: PValetta MoleM.D.   On: 07/18/2021 11:25   CT Angio Chest PE W and/or Wo Contrast  Result Date: 07/11/2021 CLINICAL DATA:  Shortness of breath x2 days. EXAM: CT ANGIOGRAPHY CHEST WITH CONTRAST TECHNIQUE: Multidetector CT imaging of the chest was performed using the standard protocol during bolus administration of intravenous contrast. Multiplanar CT image reconstructions and MIPs were obtained to evaluate  the vascular anatomy. CONTRAST:  153m OMNIPAQUE IOHEXOL 350 MG/ML SOLN COMPARISON:  None. FINDINGS: Cardiovascular: The subsegmental pulmonary arteries are limited in evaluation secondary to suboptimal opacification with intravenous contrast as well as areas of overlying artifact. No evidence of pulmonary embolism. Normal heart size with mild coronary artery calcification. No pericardial effusion. Mediastinum/Nodes: No enlarged mediastinal, hilar, or axillary lymph nodes. Thyroid gland, trachea, and esophagus demonstrate no significant findings. Lungs/Pleura: Moderate to marked severity atelectasis and/or infiltrate is seen within the left lower lobe and along the periphery of the left upper lobe. Mild posterior right basilar atelectasis and/or infiltrate is also seen. A small to moderate size left-sided pleural effusion is seen. No pneumothorax is identified. Upper Abdomen: No acute abnormality. Musculoskeletal: No chest wall abnormality. No acute or significant osseous findings. Review of the MIP images confirms the above findings. IMPRESSION: 1.  Moderate to marked severity left lower lobe and left upper lobe atelectasis and/or infiltrate. 2. Mild posterior right basilar atelectasis and/or infiltrate. 3. Small to moderate size left-sided pleural effusion. 4. Limited evaluation of the pulmonary arteries, as described above, without definite evidence of pulmonary embolism. Electronically Signed   By: TVirgina NorfolkM.D.   On: 07/11/2021 23:14   UKoreaCHEST (PLEURAL EFFUSION)  Result Date: 07/12/2021 CLINICAL DATA:  Left pleural effusion. EXAM: CHEST ULTRASOUND COMPARISON:  July 11, 2021. FINDINGS: Sonographic evaluation of the left hemithorax demonstrates small and somewhat complex appearing left pleural effusion. Adequate fluid pocket for thoracentesis is not visualized. IMPRESSION: Small and somewhat complex appearing left pleural effusion is noted. Electronically Signed   By: JMarijo ConceptionM.D.   On: 07/12/2021 10:38   DG CHEST PORT 1 VIEW  Result Date: 07/26/2021 CLINICAL DATA:  Empyema drainage EXAM: PORTABLE CHEST 1 VIEW COMPARISON:  07/25/2021 FINDINGS: Left chest tube unchanged in position. Pleural thickening in the left lung base and lateral chest wall unchanged. No pneumothorax. Left lower lobe consolidation unchanged Right lung remains clear. IMPRESSION: No interval change.  Left chest tube in place.  No pneumothorax. Electronically Signed   By: CFranchot GalloM.D.   On: 07/26/2021 14:25   DG CHEST PORT 1 VIEW  Result Date: 07/25/2021 CLINICAL DATA:  Removal of 1 of 2 left chest tubes.  Empyema. EXAM: PORTABLE CHEST 1 VIEW COMPARISON:  Earlier today at 5:07 a.m. FINDINGS: 11:04 a.m.  Removal of 1 left chest tube. The right costophrenic angle is minimally excluded. Midline trachea. Normal heart size. Small left pleural effusion and pleural thickening remain. No pneumothorax. Minimal subcutaneous emphysema about the left chest wall inferiorly. Clear right lung. Mid and lower left lung airspace disease, similar. IMPRESSION: No  pneumothorax after removal of 1 of 2 left chest tubes. Similar left pleural fluid and thickening with adjacent Airspace disease, likely atelectasis. Electronically Signed   By: KAbigail MiyamotoM.D.   On: 07/25/2021 11:16   DG CHEST PORT 1 VIEW  Result Date: 07/25/2021 CLINICAL DATA:  Chest tube present, empyema EXAM: PORTABLE CHEST 1 VIEW COMPARISON:  07/24/2021 FINDINGS: Left chest tubes are again identified. Similar left pleural effusion. No pneumothorax. Persistent patchy left lung opacities similar to prior. Stable cardiomediastinal contours. IMPRESSION: Similar left pleural effusion and patchy left lung opacities with chest tubes present. Electronically Signed   By: PMacy MisM.D.   On: 07/25/2021 08:30   DG Chest Port 1 View  Result Date: 07/24/2021 CLINICAL DATA:  Chest tube present post lobectomy EXAM: PORTABLE CHEST 1 VIEW COMPARISON:  07/23/2021 FINDINGS: Left chest tubes remain  present. No pneumothorax. Decreased left pleural effusion. Persistent patchy opacities in the left lung with improved aeration. Similar cardiomediastinal contours. IMPRESSION: Chest tubes present without pneumothorax. Decreased left pleural effusion and persistent patchy left opacities with improved aeration. Electronically Signed   By: Macy Mis M.D.   On: 07/24/2021 08:23   DG Chest Port 1 View  Result Date: 07/23/2021 CLINICAL DATA:  LEFT thoracotomy EXAM: PORTABLE CHEST 1 VIEW COMPARISON:  CT 07/22/2021 FINDINGS: Interval reduction in LEFT pleural fluid. 2 LEFT chest tubes in place. No pneumothorax. Low lung volumes. Basilar atelectasis. IMPRESSION: 1. Decrease in volume of LEFT loculated effusion. 2. Two chest tubes LEFT hemithorax without pneumothorax. 3. LEFT basilar atelectasis and low lung volumes Electronically Signed   By: Suzy Bouchard M.D.   On: 07/23/2021 18:02   DG CHEST PORT 1 VIEW  Result Date: 07/21/2021 CLINICAL DATA:  Chest pain.  Follow-up left pleural effusion. EXAM: PORTABLE  CHEST 1 VIEW COMPARISON:  07/20/2021 FINDINGS: Left pleural pigtail catheter remains in place. A moderate left pleural effusion is unchanged in size. No pneumothorax visualized. Left lower lobe atelectasis is also stable. Right lung remains grossly clear. Heart size remains within normal limits. IMPRESSION: Stable moderate left pleural effusion and left lower lobe atelectasis. Electronically Signed   By: Marlaine Hind M.D.   On: 07/21/2021 08:46   DG CHEST PORT 1 VIEW  Result Date: 07/20/2021 CLINICAL DATA:  Chest tube placement EXAM: PORTABLE CHEST 1 VIEW COMPARISON:  Chest radiograph 07/19/2021 FINDINGS: The left sided pigtail catheter projects over the left base, unchanged. The cardiomediastinal silhouette is grossly stable. The moderate size left pleural effusion layering along the left chest wall is similar in size to the study from 1 day prior, though aeration of the left lung appears slightly improved. There is no pneumothorax. The right lung is clear. There is no right pleural effusion or pneumothorax. There is no acute osseous abnormality. IMPRESSION: Left chest tube in place with similar size of the left pleural effusion layering along the chest wall but slightly improved aeration of the left lung. No pneumothorax. Electronically Signed   By: Valetta Mole M.D.   On: 07/20/2021 07:27   DG CHEST PORT 1 VIEW  Result Date: 07/19/2021 CLINICAL DATA:  Follow-up of pleural effusion.  Chest tube in place. EXAM: PORTABLE CHEST 1 VIEW COMPARISON:  07/18/2021 FINDINGS: Left-sided pleural drain/pigtail catheter. Midline trachea. Normal heart size. Loculated moderate left pleural effusion is similar. No pneumothorax. Low lung volumes with resultant pulmonary interstitial prominence. Mild subsegmental atelectasis in the medial right lung base. Increase in left lower lobe airspace disease. IMPRESSION: Left-sided pleural drain in place, with persistent loculated pleural fluid but no evidence of pneumothorax.  Diminished lung volumes. Increased left base atelectasis or infection. Electronically Signed   By: Abigail Miyamoto M.D.   On: 07/19/2021 16:20   DG CHEST PORT 1 VIEW  Result Date: 07/18/2021 CLINICAL DATA:  Community acquired pneumonia EXAM: PORTABLE CHEST 1 VIEW COMPARISON:  07/16/2021 FINDINGS: Heart is normal size. Right lung remains clear. Moderate left pleural effusion with left lower lobe airspace disease, unchanged. No acute bony abnormality. IMPRESSION: Continued moderate left effusion with left lower lobe airspace disease. Electronically Signed   By: Rolm Baptise M.D.   On: 07/18/2021 08:12   DG CHEST PORT 1 VIEW  Result Date: 07/16/2021 CLINICAL DATA:  Pneumonia EXAM: PORTABLE CHEST 1 VIEW COMPARISON:  Chest x-ray 07/14/2021 FINDINGS: Heart size appears normal. Mediastinum is within normal limits. Right lung and pleura remain  clear. Interval improving aeration of the left upper lung zone and decreased size of a now moderate left pleural effusion. No pneumothorax. IMPRESSION: Improvement since previous study. Persistent moderate left pleural effusion with associated atelectasis/infiltrate. Electronically Signed   By: Ofilia Neas M.D.   On: 07/16/2021 08:20   DG Chest Port 1 View  Result Date: 07/14/2021 CLINICAL DATA:  Shortness of breath, ICU EXAM: PORTABLE CHEST 1 VIEW COMPARISON:  Chest radiograph dated 1 day prior FINDINGS: The cardiomediastinal silhouette is suboptimally evaluated. A large left pleural effusion with near complete opacification of the left hemithorax is again seen with a small amount of aerated left upper lobe, not significantly changed compared to the study from 1 day prior. There is a possible trace right pleural effusion. The right lung otherwise remains clear. There is no right effusion. There is no pneumothorax. The bones are stable. IMPRESSION: 1. Large left pleural effusion with a small amount of aerated left upper lobe is not significantly changed compared to  the study from 1 day prior. 2. Unchanged possible trace right pleural effusion. Electronically Signed   By: Valetta Mole M.D.   On: 07/14/2021 10:45   DG Chest Port 1 View  Result Date: 07/13/2021 CLINICAL DATA:  Shortness of breath, left pleural effusion EXAM: PORTABLE CHEST 1 VIEW COMPARISON:  Previous studies including the examination of 07/12/2021 FINDINGS: There is further increase in size of left pleural effusion with almost complete opacification. There is decreased aeration in the left lung with small area of aeration in the left upper lung fields. Increased density in the right lower lung fields Voigt be due to pleural effusion and possibly underlying atelectasis. There is no pneumothorax. IMPRESSION: There is increase in amount of large left pleural effusion with almost complete atelectasis of left lung with small residual area of aeration in the left upper lung fields. Increased density in the right lower lung fields suggests small effusion and possibly underlying atelectasis with slight improvement. Electronically Signed   By: Elmer Picker M.D.   On: 07/13/2021 08:11   DG CHEST PORT 1 VIEW  Result Date: 07/12/2021 CLINICAL DATA:  Increased shortness of breath EXAM: PORTABLE CHEST 1 VIEW COMPARISON:  Chest x-ray dated July 11, 2021 FINDINGS: Visualized cardiac and mediastinal contours are unchanged. Small right and large left pleural effusions with associated atelectasis, increased compared to prior exam. New rightward deviation of the trachea. No evidence of pneumothorax. IMPRESSION: 1. Small right and large left pleural effusions with associated atelectasis, increased compared to prior exam. 2. New rightward deviation of the trachea, likely related to large left pleural effusion. Electronically Signed   By: Yetta Glassman M.D.   On: 07/12/2021 14:05   DG Chest Port 1V same Day  Result Date: 07/27/2021 CLINICAL DATA:  Left chest removal EXAM: PORTABLE CHEST 1 VIEW COMPARISON:   Previous studies including the chest radiograph done earlier today FINDINGS: There is interval removal of left chest tube. There is possible tiny loculated left pneumothorax in the lateral aspect of left mid lung fields. There is decrease in subcutaneous emphysema in the left chest wall. There are linear patchy infiltrates in the left mid and left lower lung fields. Left hemidiaphragm is elevated. There is possible loculated pleural effusion in the lateral aspect of left mid lung fields. There is no demonstrable apical pneumothorax. Right lung is clear. IMPRESSION: Interval removal of left chest tube. There is possible minimal loculated pneumothorax in the lateral aspect of left mid lung fields. There is no demonstrable  left apical pneumothorax. There are patchy infiltrates in the left lower lung fields and elevation of left hemidiaphragm suggesting atelectasis and decreased volume in the left lung. Small left pleural effusion part of which appears to be loculated along the left chest wall. Electronically Signed   By: Elmer Picker M.D.   On: 07/27/2021 16:32   ECHOCARDIOGRAM COMPLETE  Result Date: 07/21/2021    ECHOCARDIOGRAM REPORT   Patient Name:   GIANFRANCO ARAKI Demasi Date of Exam: 07/21/2021 Medical Rec #:  [id number]    Height:       71.0 in Accession #:    [id number]   Weight:       249.6 lb Date of Birth:  1976-04-05   BSA:          2.317 m Patient Age:    44 years     BP:           125/78 mmHg Patient Gender: M            HR:           89 bpm. Exam Location:  Inpatient Procedure: 2D Echo, Cardiac Doppler and Color Doppler Indications:    CHF  History:        Patient has no prior history of Echocardiogram examinations.                 Pleural drain in place.  Sonographer:    [name] RDCS Referring Phys: [id number] [place]  1. Left ventricular ejection fraction, by estimation, is 65 to 70%. The left ventricle has normal function. The left ventricle has no regional wall motion  abnormalities. There is mild left ventricular hypertrophy. Left ventricular diastolic parameters were normal.  2. Right ventricular systolic function is normal. The right ventricular size is normal. Tricuspid regurgitation signal is inadequate for assessing PA pressure.  3. The mitral valve is normal in structure. No evidence of mitral valve regurgitation.  4. The aortic valve was not well visualized. Aortic valve regurgitation is not visualized. No aortic stenosis is present. FINDINGS  Left Ventricle: Left ventricular ejection fraction, by estimation, is 65 to 70%. The left ventricle has normal function. The left ventricle has no regional wall motion abnormalities. The left ventricular internal cavity size was normal in size. There is  mild left ventricular hypertrophy. Left ventricular diastolic parameters were normal. Right Ventricle: The right ventricular size is normal. No increase in right ventricular wall thickness. Right ventricular systolic function is normal. Tricuspid regurgitation signal is inadequate for assessing PA pressure. Left Atrium: Left atrial size was normal in size. Right Atrium: Right atrial size was normal in size. Pericardium: There is no evidence of pericardial effusion. Mitral Valve: The mitral valve is normal in structure. No evidence of mitral valve regurgitation. Tricuspid Valve: The tricuspid valve is normal in structure. Tricuspid valve regurgitation is trivial. Aortic Valve: The aortic valve was not well visualized. Aortic valve regurgitation is not visualized. No aortic stenosis is present. Pulmonic Valve: The pulmonic valve was not well visualized. Pulmonic valve regurgitation is not visualized. Aorta: The aortic root is normal in size and structure. IAS/Shunts: The interatrial septum was not well visualized.  LEFT VENTRICLE PLAX 2D LVIDd:         4.30 cm   Diastology LVIDs:         3.00 cm   LV e' medial:    6.53 cm/s LV PW:         1.10 cm   LV E/e'  medial:  8.3 LV IVS:         1.10 cm   LV e' lateral:   9.25 cm/s LVOT diam:     2.30 cm   LV E/e' lateral: 5.9 LV SV:         76 LV SV Index:   33 LVOT Area:     4.15 cm  RIGHT VENTRICLE RV Basal diam:  3.20 cm LEFT ATRIUM             Index        RIGHT ATRIUM          Index LA diam:        4.00 cm 1.73 cm/m   RA Area:     9.00 cm LA Vol (A2C):   89.7 ml 38.72 ml/m  RA Volume:   14.30 ml 6.17 ml/m LA Vol (A4C):   63.9 ml 27.58 ml/m LA Biplane Vol: 77.7 ml 33.54 ml/m  AORTIC VALVE LVOT Vmax:   121.00 cm/s LVOT Vmean:  78.600 cm/s LVOT VTI:    0.182 m  AORTA Ao Root diam: 3.40 cm MITRAL VALVE MV Area (PHT): 3.48 cm    SHUNTS MV Decel Time: 218 msec    Systemic VTI:  0.18 m MV E velocity: 54.50 cm/s  Systemic Diam: 2.30 cm MV A velocity: 70.70 cm/s MV E/A ratio:  0.77 Oswaldo Milian MD Electronically signed by Oswaldo Milian MD Signature Date/Time: 07/21/2021/5:51:59 PM    Final    US Abdomen Limited RUQ (LIVER/GB)  Result Date: 07/18/2021 CLINICAL DATA:  Elevated LFTs EXAM: ULTRASOUND ABDOMEN LIMITED RIGHT UPPER QUADRANT COMPARISON:  CT abdomen and pelvis 07/12/2021 FINDINGS: Gallbladder: Mobile echogenic shadowing calculi. No significant wall thickening or pericholecystic fluid. No sonographic Murphy sign. Common bile duct: Diameter: 5 Liver: Mildly increased echogenicity with no focal mass identified. Portal vein is patent on color Doppler imaging with normal direction of blood flow towards the liver. Other: None. IMPRESSION: 1. Cholelithiasis. 2. Mildly increased echogenicity of the liver parenchyma which Pair represent hepatic steatosis and/or other hepatocellular disease. Electronically Signed   By: Ofilia Neas M.D.   On: 07/18/2021 10:34    Labs: BNP (last 3 results) Recent Labs    07/21/21 0106 07/22/21 0107 07/23/21 0105  BNP 8.1 11.7 7.1   Basic Metabolic Panel: Recent Labs  Lab 07/22/21 0107 07/23/21 0105 07/23/21 1709 07/24/21 0503 07/25/21 0554 07/26/21 0347 07/27/21 0251  07/28/21 0126  NA 132* 129*   < > 134* 138 139 134* 135  K 4.7 3.5   < > 4.6 3.6 4.3 4.2 4.4  CL 96* 92*  --  99 103 103 100 98  CO2 28 30  --  30 30 33* 30 31  GLUCOSE 125* 104*  --  138* 121* 95 92 97  BUN 17 16  --  19 24* _0 CREATININE 0.94 0.88  --  0.90 0.83 0.75 0.56* 0.88  CALCIUM 7.9* 7.5*  --  7.8* 7.4* 7.6* 7.9* 8.2*  MG 2.0 2.1  --   --  2.1  --   --   --    < > = values in this interval not displayed.   Liver Function Tests: Recent Labs  Lab 07/22/21 0107 07/23/21 0105 07/25/21 0554  AST 17 13* 20  ALT 68* 43 28  ALKPHOS 136* 116 110  BILITOT 0.9 0.4 0.4  PROT 5.8* 5.3* 4.7*  ALBUMIN 2.0* 1.7* 1.5*   No results for input(s): LIPASE, AMYLASE in  the last 168 hours. No results for input(s): AMMONIA in the last 168 hours. CBC: Recent Labs  Lab 07/23/21 0105 07/23/21 1709 07/24/21 0503 07/25/21 0554 07/26/21 0347 07/27/21 0251 07/28/21 0126  WBC 19.4*  --  27.4* 14.1* 10.6* 9.3 10.3  NEUTROABS 13.8*  --   --  9.5* 5.8 4.8 5.7  HGB 12.2*   < > 11.7* 9.1* 8.6* 9.5* 10.5*  HCT 37.2*   < > 36.0* 27.6* 27.2* 29.7* 31.9*  MCV 88.4  --  88.5 89.6 90.7 90.5 89.6  PLT 398  --  528* 427* 469* 506* 538*   < > = values in this interval not displayed.   Cardiac Enzymes: No results for input(s): CKTOTAL, CKMB, CKMBINDEX, TROPONINI in the last 168 hours. BNP: Invalid input(s): POCBNP CBG: Recent Labs  Lab 07/23/21 2008 07/23/21 2347 07/24/21 0454  GLUCAP 139* 148* 137*   D-Dimer No results for input(s): DDIMER in the last 72 hours. Hgb A1c No results for input(s): HGBA1C in the last 72 hours. Lipid Profile No results for input(s): CHOL, HDL, LDLCALC, TRIG, CHOLHDL, LDLDIRECT in the last 72 hours. Thyroid function studies No results for input(s): TSH, T4TOTAL, T3FREE, THYROIDAB in the last 72 hours.  Invalid input(s): FREET3 Anemia work up No results for input(s): VITAMINB12, FOLATE, FERRITIN, TIBC, IRON, RETICCTPCT in the last 72  hours. Urinalysis    Component Value Date/Time   COLORURINE YELLOW 07/23/2021 1559   APPEARANCEUR HAZY (A) 07/23/2021 1559   LABSPEC 1.025 07/23/2021 1559   PHURINE 6.0 07/23/2021 1559   GLUCOSEU NEGATIVE 07/23/2021 1559   HGBUR NEGATIVE 07/23/2021 1559   BILIRUBINUR NEGATIVE 07/23/2021 1559   KETONESUR NEGATIVE 07/23/2021 1559   PROTEINUR NEGATIVE 07/23/2021 1559   NITRITE NEGATIVE 07/23/2021 1559   LEUKOCYTESUR NEGATIVE 07/23/2021 1559   Sepsis Labs Invalid input(s): PROCALCITONIN,  WBC,  LACTICIDVEN Microbiology Recent Results (from the past 240 hour(s))  Culture, fungus without smear     Status: None (Preliminary result)   Collection Time: 07/23/21  3:05 PM   Specimen: Pleural, Left; Body Fluid  Result Value Ref Range Status   Specimen Description FLUID  Final   Special Requests PLEURAL FLUID  Final   Culture   Final    NO GROWTH 4 DAYS Performed at [facility], [address], [place], [place] [address]    Report Status PENDING  Incomplete  Aerobic/Anaerobic Culture w Gram Stain (surgical/deep wound)     Status: None (Preliminary result)   Collection Time: 07/23/21  3:05 PM   Specimen: Pleural, Left; Body Fluid  Result Value Ref Range Status   Specimen Description FLUID  Final   Special Requests PLEURAL FLUID  Final   Gram Stain NO WBC SEEN NO ORGANISMS SEEN   Final   Culture   Final    NO GROWTH 4 DAYS NO ANAEROBES ISOLATED; CULTURE IN PROGRESS FOR 5 DAYS Performed at [facility], [address], [place], [place] [address]    Report Status PENDING  Incomplete  Acid Fast Smear (AFB)     Status: None   Collection Time: 07/23/21  3:05 PM   Specimen: Pleural, Left; Body Fluid  Result Value Ref Range Status   AFB Specimen Processing Concentration  Final   Acid Fast Smear Negative  Final    Comment: (NOTE) Performed At: [facility] [place], [place] [id number] Rush Farmer MD UD:[id number]    Source (AFB) PLEURAL  Final     Comment: FLUID Performed at  [facility], [place] [address] [place], [place] [address]      Time coordinating discharge: 25 minutes  SIGNED: Antonieta Pert, MD  [facility] 07/28/2021, 12:14 PM  If 7PM-7AM, please contact night-coverage [email or web address]

## 2021-07-28 NOTE — TOC Transition Note (Signed)
Transition of Care [facility]) - CM/SW Discharge Note   Patient Details  Name: Victor [name] MRN: [id number] Date of Birth: 1975-11-28  [facility] CM/SW Contact:  Zenon Mayo, RN Phone Number: 07/28/2021, 1:06 PM   Clinical Narrative:    Patient is for dc today, he states his wife will transport him home, NCM offered choice for HHPT, could not get any agency to take referral, patient states he can go to outpatient therapy.  NCM made referral to [facility] outpatient therapy in [place], listed on AVS.       Barriers to Discharge: Continued Medical Work up   Patient Goals and CMS Choice        Discharge Placement                       Discharge Plan and Services   Discharge Planning Services: CM Consult                                 Social Determinants of Health (SDOH) Interventions     Readmission Risk Interventions No flowsheet data found.

## 2021-07-28 NOTE — Progress Notes (Addendum)
[address]       [place] [address]             [phone]      5 Days Post-Op Procedure(s) (LRB): THORACOTOMY with drainage of empyema (Left) DECORTICATION of left lung (Left) Subjective: Feels ok, no new c/o  Objective: Vital signs in last 24 hours: Temp:  [98.3 F (36.8 C)-98.5 F (36.9 C)] 98.3 F (36.8 C) (12/03 0731) Pulse Rate:  [83-104] 83 (12/03 0731) Cardiac Rhythm: Normal sinus rhythm (12/03 0700) Resp:  [13-19] 15 (12/03 0731) BP: (116-134)/(68-80) 123/75 (12/03 0731) SpO2:  [90 %-96 %] 96 % (12/03 0738) FiO2 (%):  [21 %-96 %] 21 % (12/03 0738)  Hemodynamic parameters for last 24 hours:    Intake/Output from previous day: 12/02 0701 - 12/03 0700 In: 480 [P.O.:480] Out: 650 [Urine:650] Intake/Output this shift: No intake/output data recorded.  General appearance: alert, cooperative, and no distress Heart: regular rate and rhythm Lungs: dim left lower fields Wound: incis healing well, + significant flank echymosis  Lab Results: Recent Labs    07/27/21 0251 07/28/21 0126  WBC 9.3 10.3  HGB 9.5* 10.5*  HCT 29.7* 31.9*  PLT 506* 538*   BMET:  Recent Labs    07/27/21 0251 07/28/21 0126  NA 134* 135  K 4.2 4.4  CL 100 98  CO2 30 31  GLUCOSE 92 97  BUN 13 14  CREATININE 0.56* 0.88  CALCIUM 7.9* 8.2*    PT/INR: No results for input(s): LABPROT, INR in the last 72 hours. ABG    Component Value Date/Time   PHART 7.422 07/24/2021 0457   HCO3 33.0 (H) 07/24/2021 0457   TCO2 35 (H) 07/24/2021 0457   O2SAT 99.0 07/24/2021 0457   CBG (last 3)  No results for input(s): GLUCAP in the last 72 hours.  Meds Scheduled Meds:  amitriptyline  10 mg Oral QHS   amLODipine  5 mg Oral Daily   arformoterol  15 mcg Nebulization BID   bisacodyl  10 mg Oral Daily   budesonide (PULMICORT) nebulizer solution  0.5 mg Nebulization BID   Chlorhexidine Gluconate Cloth  6 each Topical Daily   dextromethorphan-guaiFENesin  1 tablet  Oral BID   enoxaparin (LOVENOX) injection  40 mg Subcutaneous Daily   nicotine  21 mg Transdermal Daily   nystatin  5 mL Oral QID   pantoprazole  40 mg Oral BID   senna-docusate  1 tablet Oral QHS   Continuous Infusions:  sodium chloride 10 mL/hr at 07/25/21 0358   meropenem (MERREM) IV 1 g (07/28/21 0429)   PRN Meds:.sodium chloride, methocarbamol, ondansetron (ZOFRAN) IV, oxyCODONE-acetaminophen **AND** oxyCODONE, sodium chloride, traZODone  Xrays DG Chest 2 View  Result Date: 07/28/2021 CLINICAL DATA:  Chest tube removal. EXAM: CHEST - 2 VIEW COMPARISON:  07/27/2021 FINDINGS: 0530 hours. Lateral left pneumothorax is similar to prior with left base collapse/consolidative opacity and minimal left pleural thickening/fluid. Right lung remains clear. The cardiopericardial silhouette is within normal limits for size. The visualized bony structures of the thorax show no acute abnormality. Telemetry leads overlie the chest. IMPRESSION: Stable exam. Lateral left pneumothorax with left base collapse/consolidative opacity and minimal left pleural thickening/fluid. Electronically Signed   By: Misty Stanley M.D.   On: 07/28/2021 08:07   DG Chest 2 View  Result Date: 07/27/2021 CLINICAL DATA:  Empyema. EXAM: CHEST - 2 VIEW COMPARISON:  07/26/2021 FINDINGS: A left chest tube is unchanged in position. Subcutaneous emphysema in the  left chest wall has mildly increased. The cardiomediastinal silhouette is unchanged. There is a persistent small left pleural effusion/pleural thickening. There are patchy airspace opacities in the left lower lung which have improved. A trace pneumothorax is not excluded laterally in the left mid to lower hemithorax. The right lung is clear. IMPRESSION: Persistent small left pleural effusion/pleural thickening with improved aeration of the left lung base. Trace left pneumothorax not excluded. Electronically Signed   By: Logan Bores M.D.   On: 07/27/2021 08:00   DG CHEST PORT 1  VIEW  Result Date: 07/26/2021 CLINICAL DATA:  Empyema drainage EXAM: PORTABLE CHEST 1 VIEW COMPARISON:  07/25/2021 FINDINGS: Left chest tube unchanged in position. Pleural thickening in the left lung base and lateral chest wall unchanged. No pneumothorax. Left lower lobe consolidation unchanged Right lung remains clear. IMPRESSION: No interval change.  Left chest tube in place.  No pneumothorax. Electronically Signed   By: Franchot Gallo M.D.   On: 07/26/2021 14:25   DG Chest Port 1V same Day  Result Date: 07/27/2021 CLINICAL DATA:  Left chest removal EXAM: PORTABLE CHEST 1 VIEW COMPARISON:  Previous studies including the chest radiograph done earlier today FINDINGS: There is interval removal of left chest tube. There is possible tiny loculated left pneumothorax in the lateral aspect of left mid lung fields. There is decrease in subcutaneous emphysema in the left chest wall. There are linear patchy infiltrates in the left mid and left lower lung fields. Left hemidiaphragm is elevated. There is possible loculated pleural effusion in the lateral aspect of left mid lung fields. There is no demonstrable apical pneumothorax. Right lung is clear. IMPRESSION: Interval removal of left chest tube. There is possible minimal loculated pneumothorax in the lateral aspect of left mid lung fields. There is no demonstrable left apical pneumothorax. There are patchy infiltrates in the left lower lung fields and elevation of left hemidiaphragm suggesting atelectasis and decreased volume in the left lung. Small left pleural effusion part of which appears to be loculated along the left chest wall. Electronically Signed   By: Elmer Picker M.D.   On: 07/27/2021 16:32    Recent Results (from the past 240 hour(s))  Culture, fungus without smear     Status: None (Preliminary result)   Collection Time: 07/23/21  3:05 PM   Specimen: Pleural, Left; Body Fluid  Result Value Ref Range Status   Specimen Description FLUID   Final   Special Requests PLEURAL FLUID  Final   Culture   Final    NO GROWTH 4 DAYS Performed at [facility], [address], [place], [place] [address]    Report Status PENDING  Incomplete  Aerobic/Anaerobic Culture w Gram Stain (surgical/deep wound)     Status: None (Preliminary result)   Collection Time: 07/23/21  3:05 PM   Specimen: Pleural, Left; Body Fluid  Result Value Ref Range Status   Specimen Description FLUID  Final   Special Requests PLEURAL FLUID  Final   Gram Stain NO WBC SEEN NO ORGANISMS SEEN   Final   Culture   Final    NO GROWTH 4 DAYS NO ANAEROBES ISOLATED; CULTURE IN PROGRESS FOR 5 DAYS Performed at [facility], [address], [place], [place] [address]    Report Status PENDING  Incomplete  Acid Fast Smear (AFB)     Status: None   Collection Time: 07/23/21  3:05 PM   Specimen: Pleural, Left; Body Fluid  Result Value Ref Range Status  AFB Specimen Processing Concentration  Final   Acid Fast Smear Negative  Final    Comment: (NOTE) Performed At: [facility] [place], [place] [id number] Rush Farmer MD MV:[id number]    Source (AFB) PLEURAL  Final    Comment: FLUID Performed at [facility], [place] [address], [place], [place] [address]        Assessment/Plan: S/P Procedure(s) (LRB): THORACOTOMY with drainage of empyema (Left) DECORTICATION of left lung (Left)  1 afeb, VSS 2 sats good on RA 3 no leukocytosis 4 anemia improved 5 reactive thrombocytosis 6 increased abs immature granulocytes- intra-op  cultures neg Abx per medicine 7 CT removed yesterday, no new CXR's 8 discharge decision  per primary- f/u appt in AVS with d/c instructions    LOS: 16 days    Victor [name] 07/28/2021    Tube out Please call with questions  Lajuana Matte

## 2021-07-30 ENCOUNTER — Telehealth: Payer: Self-pay

## 2021-07-30 NOTE — Telephone Encounter (Signed)
Transition Care Management Follow-up Telephone Call Date of discharge and from where: [facility] - acute respiratory failure due to pneumonia How have you been since you were released from the hospital? better Any questions or concerns? No  Items Reviewed: Did the pt receive and understand the discharge instructions provided? Yes  Medications obtained and verified? Yes  Other? No  Any new allergies since your discharge? No  Dietary orders reviewed? Yes Do you have support at home? Yes   Home Care and Equipment/Supplies: Were home health services ordered? no If so, what is the name of the agency?  Outpatient PT was ordered only  Has the agency set up a time to come to the patient's home? no Were any new equipment or medical supplies ordered?  No What is the name of the medical supply agency? N/a Were you able to get the supplies/equipment? no Do you have any questions related to the use of the equipment or supplies? No  Functional Questionnaire: (I = Independent and D = Dependent) ADLs: I  Bathing/Dressing- I  Meal Prep- I  Eating- I  Maintaining continence- I  Transferring/Ambulation- I  Managing Meds- I  Follow up appointments reviewed:  PCP Hospital f/u appt confirmed? Yes  Scheduled to see Gottschalk on 08/08/21 @ 9:15. [facility] f/u appt confirmed? Yes  Scheduled to see surgeon on 12/9 @ 11. Are transportation arrangements needed? No  If their condition worsens, is the pt aware to call PCP or go to the Emergency Dept.? Yes Was the patient provided with contact information for the PCP's office or ED? Yes Was to pt encouraged to call back with questions or concerns? Yes

## 2021-07-30 NOTE — Telephone Encounter (Signed)
Transition Care Management Unsuccessful Follow-up Telephone Call  Date of discharge and from where:  07/28/2021 from [facility]  Attempts:  1st Attempt  Reason for unsuccessful TCM follow-up call:  Left voice message

## 2021-07-31 NOTE — Telephone Encounter (Signed)
Transition Care Management Unsuccessful Follow-up Telephone Call  Date of discharge and from where:  07/28/2021 from [facility]  Attempts:  2nd Attempt  Reason for unsuccessful TCM follow-up call:  Left voice message

## 2021-08-01 NOTE — Telephone Encounter (Signed)
Transition Care Management Unsuccessful Follow-up Telephone Call  Date of discharge and from where:  07/28/2021 from [facility]  Attempts:  3rd Attempt  Reason for unsuccessful TCM follow-up call:  Unable to reach patient

## 2021-08-02 ENCOUNTER — Ambulatory Visit: Payer: Medicaid Other | Attending: Internal Medicine

## 2021-08-02 ENCOUNTER — Other Ambulatory Visit: Payer: Self-pay

## 2021-08-02 DIAGNOSIS — M6281 Muscle weakness (generalized): Secondary | ICD-10-CM | POA: Insufficient documentation

## 2021-08-02 DIAGNOSIS — R262 Difficulty in walking, not elsewhere classified: Secondary | ICD-10-CM | POA: Insufficient documentation

## 2021-08-02 NOTE — Therapy (Addendum)
[facility] [place], [place], [address] Phone: [phone]   Fax:  [phone]  Physical Therapy Evaluation  Patient Details  Name: Victor [name] MRN: [id number] Date of Birth: 01/01/1976 Referring Provider (PT): Antonieta Pert, MD   Encounter Date: 08/02/2021   PT End of Session - 08/02/21 0956     Visit Number 1    Number of Visits 12    Date for PT Re-Evaluation 10/19/21    PT Start Time 0905    PT Stop Time 0943    PT Time Calculation (min) 38 min    Activity Tolerance Patient tolerated treatment well    Behavior During Therapy [facility] for tasks assessed/performed             Past Medical History:  Diagnosis Date   GERD (gastroesophageal reflux disease)    History of stomach ulcers    Hypertension     Past Surgical History:  Procedure Laterality Date   ANTERIOR CRUCIATE LIGAMENT REPAIR Right 05/19/2018   Procedure: RIGHT KNEE ARTHROSCOPY WITH ANTERIOR CRUCIATE LIGAMENT (ACL) REPAIR and MEDIAL MENISECTOMY;  Surgeon: [name], MD;  Location: [facility];  Service: Orthopedics;  Laterality: Right;   APPENDECTOMY     DECORTICATION Left 07/23/2021   Procedure: DECORTICATION of left lung;  Surgeon: Gaye Pollack, MD;  Location: [facility] OR;  Service: Thoracic;  Laterality: Left;   HARDWARE REMOVAL Right 09/21/2019   Procedure: HARDWARE REMOVAL (RICHARD'S STAPLE) RIGHT KNEE;  Surgeon: [name], MD;  Location: [facility];  Service: Orthopedics;  Laterality: Right;   IR PERC PLEURAL DRAIN W/INDWELL CATH W/IMG GUIDE  07/19/2021   KNEE ARTHROSCOPY WITH MEDIAL MENISECTOMY Right 05/29/2017   Procedure: KNEE ARTHROSCOPY WITH MEDIAL MENISECTOMY and lateral meniscectomy;  Surgeon: [name], MD;  Location: [facility];  Service: Orthopedics;  Laterality: Right;   KNEE ARTHROSCOPY WITH MEDIAL MENISECTOMY Right 02/05/2018   Procedure: RIGHT KNEE ARTHROSCOPY WITH PARTIAL MEDIAL AND LATERAL MENISECTOMY;  Surgeon: [name], MD;  Location: [facility];  Service: Orthopedics;  Laterality: Right;   THORACOTOMY/LOBECTOMY Left 07/23/2021   Procedure: THORACOTOMY with drainage of empyema;  Surgeon: Gaye Pollack, MD;  Location: [facility] OR;  Service: Thoracic;  Laterality: Left;   TOTAL KNEE ARTHROPLASTY Right 03/28/2020   Procedure: TOTAL KNEE ARTHROPLASTY;  Surgeon: [name], MD;  Location: [facility];  Service: Orthopedics;  Laterality: Right;   TOTAL KNEE REVISION Right 08/08/2020   Procedure: REVISION OF RIGHT TOTAL KNEE  TIBIAL COMPONENTS ONLY;  Surgeon: [name], MD;  Location: [facility];  Service: Orthopedics;  Laterality: Right;    There were no vitals filed for this visit.    Subjective Assessment - 08/02/21 0904     Subjective Patient reports that he was in the hospital from 07/11/21-07/29/21 due to pnuemonia. He also had to have surgery due to this condition. He has been having a lot of pain along his left side around where his chest tubes were placed. He still has those areas bandaged. His wife has been helping to change his bandages every morning and night. He had right knee surgery earlier this year which has also limited his mobility. He notes that he gets tired and short of breath quickly since getting out of the hospital.    Pertinent History 6 knee surgeries (patient reported), "Ficek need another knee surgery"    Limitations Other (comment);Walking   sleeping   How long can you sit comfortably? unlimited  How long can you walk comfortably? 5-10 minutes    Patient Stated Goals walking and shopping for at least 30+ minutes    Currently in Pain? Yes    Pain Score 6     Pain Location Back    Pain Orientation Left;Lateral    Pain Descriptors / Indicators Sharp;Dull;Throbbing;Aching;Tender    Pain Type Surgical pain    Pain Radiating Towards midline anteriorly    Pain Onset 1 to 4 weeks ago    Pain Frequency Constant    Aggravating Factors  touch, sleeping (2 hours currently), walking     Pain Relieving Factors medication, pressure from a pillow    Effect of Pain on Daily Activities unable to complete prolonged activities (10+ minutes)                [facility] PT Assessment - 08/02/21 0001       Assessment   Medical Diagnosis Other abnormalities of gait    Referring Provider (PT) Antonieta Pert, MD    Onset Date/Surgical Date 07/09/21    Next MD Visit 08/03/21    Prior Therapy Yes, but not for this condition      Restrictions   Weight Bearing Restrictions No    Other Position/Activity Restrictions No lifting over 10 pounds      Balance Screen   Has the patient fallen in the past 6 months No    Has the patient had a decrease in activity level because of a fear of falling?  No    Is the patient reluctant to leave their home because of a fear of falling?  No      Home Environment   Living Environment Private residence    Living Arrangements Spouse/significant other    Type of [place] to enter    Entrance Stairs-Number of Steps 7    Entrance Stairs-Rails Can reach both    [place] Two level;Able to live on main level with bedroom/bathroom    [place] - single point      Prior Function   Level of Independence Independent    Vocation On disability      Cognition   Overall Cognitive Status Within Functional Limits for tasks assessed    Attention Focused    Focused Attention Appears intact    Memory Appears intact    Awareness Appears intact    Problem Solving Appears intact      Observation/Other Assessments   Observations Incision is covered with a bandage      Sensation   Additional Comments Patient reports occasional pins and needles around his incision      ROM / Strength   AROM / PROM / Strength Strength      AROM   Overall AROM Comments Unalbe to achieve full right knee extension      Strength   Strength Assessment Site Hip;Knee;Ankle    Right/Left Hip Right;Left    Right Hip Flexion 4/5    Left Hip  Flexion 4+/5    Right/Left Knee Right;Left    Right Knee Flexion 4-/5   knee pain   Right Knee Extension 4-/5   knee pain   Left Knee Flexion 5/5    Left Knee Extension 4+/5    Right/Left Ankle Left;Right    Right Ankle Dorsiflexion 4-/5    Left Ankle Dorsiflexion 4-/5      Palpation   Palpation comment Palpation to left lateral trunk was held today until  follow up with physician due to bandaging      Transfers   Transfers Stand to Sit;Sit to Stand    Sit to Stand 6: Modified independent (Device/Increase time);Without upper extremity assist    Five time sit to stand comments  28 seconds   Fatigues quickly   Stand to Sit 6: Modified independent (Device/Increase time);With armrests;With upper extremity assist    Comments Significant left weight shift      Ambulation/Gait   Ambulation/Gait Yes    Ambulation/Gait Assistance 6: Modified independent (Device/Increase time)    Assistive device None    Gait Pattern Step-through pattern;Decreased step length - right;Decreased stride length;Decreased weight shift to right;Right flexed knee in stance;Abducted- right   right hip external rotation   Ambulation Surface Level;Indoor    Gait velocity decreased      6 Minute walk- Post Test   6 Minute Walk Post Test yes      6 minute walk test results    Aerobic Endurance Distance Walked 720    Endurance additional comments Began reporting fatigue and SOB at 2:30                        Objective measurements completed on examination: See above findings.                  PT Short Term Goals - 09/29/20 0955       PT SHORT TERM GOAL #1   Title Patient will be independent with initial HEP and self-management strategies to improve functional outcomes    Time 2    Period Weeks    Status On-going    Target Date 09/15/20               PT Long Term Goals - 08/02/21 1810       PT LONG TERM GOAL #1   Title Patient will be independent with his HEP.    Time  6    Period Weeks    Status New    Target Date 09/13/21      PT LONG TERM GOAL #2   Title Patient will improve his five time sit to stand time to 16 seconds or less to reduce his fall risk at home.    Period Weeks    Status New    Target Date 09/13/21      PT LONG TERM GOAL #3   Title Patient will be able to walk at least 900 feet in six minutes for improved ability navigating his community.    Baseline 720 feet    Time 6    Period Weeks    Status New    Target Date 09/13/21                    Plan - 08/02/21 0957     Clinical Impression Statement Patient is a 45 year old male presenting to physical therapy following a prolonged hospital admission due to pneumonia which resulted in a thoracotomy on 11/28. He is continuing to experience moderate pain and deconditioning following this hospitalization which has resulted in altered gait mechanics. He exhibited a reduced distance with his 6 minute walk test and increased five time sit to stand time compared to his age matched normative values. Recommend that he continue with his impairments to return to his prior level of function.    Personal Factors and Comorbidities Other;Past/Current Experience;Comorbidity 1    Comorbidities HTN    Examination-Activity  Limitations Locomotion Level;Transfers;Bend;Sleep    Examination-Participation Restrictions Other    Stability/Clinical Decision Making Evolving/Moderate complexity    Clinical Decision Making Moderate    Rehab Potential Good    PT Frequency 2x / week    PT Duration 6 weeks    PT Treatment/Interventions ADLs/Self Care Home Management;Therapeutic activities;Therapeutic exercise;Balance training;Neuromuscular re-education;Functional mobility training;Stair training;Gait training;Patient/family education;Manual techniques    PT Next Visit Plan nustep, lower extremity strengthening    Consulted and Agree with Plan of Care Patient             Patient will benefit from  skilled therapeutic intervention in order to improve the following deficits and impairments:  Abnormal gait, Difficulty walking, Decreased range of motion, Decreased endurance, Decreased activity tolerance, Pain, Decreased balance, Decreased strength, Decreased mobility  Visit Diagnosis: Difficulty in walking, not elsewhere classified  Muscle weakness (generalized)     Problem List Patient Active Problem List   Diagnosis Date Noted   Empyema ([place]) 07/23/2021   Elevated LFTs    CAP (community acquired pneumonia) 07/12/2021   Acute respiratory failure with hypoxia ([place]) 07/12/2021   Pleural effusion on left 07/12/2021   Hypocalcemia 07/12/2021   Hypokalemia 07/12/2021   Sepsis ([place]) 07/12/2021   Pleuritic chest pain 07/12/2021   Sepsis due to undetermined organism ([place]) 07/12/2021   Lobar pneumonia ([place]) 07/12/2021   S/P revision of total knee, right 08/08/20 08/08/2020   Arthrofibrosis of knee joint, right    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   S/P hardware removal right knee 09/21/19 10/04/2019   Essential hypertension 04/07/2019   Family history of early CAD 04/07/2019   History of chest pain 03/10/2019   Hyperlipidemia 03/10/2019   Tobacco abuse 03/10/2019   S/P right knee arthroscopy 02/05/18 06/23/2017   S/P ACL repair right 05/19/18    Chondromalacia of lateral femoral condyle, right    Chondromalacia, patella, right     Darlin Coco, PT 08/02/2021, [facility] [place], [place], [address] Phone: [phone]   Fax:  [phone]  Name: Victor [name] MRN: [id number] Date of Birth: 11-02-1975  PHYSICAL THERAPY DISCHARGE SUMMARY  Visits from Start of Care: 1  Current functional level related to goals / functional outcomes: Patient did not return to physical therapy after his evaluation and is being discharged at this time.    Remaining deficits: See evaluation    Education /  Equipment: Educated on his condition    Patient agrees to discharge. Patient goals were not met. Patient is being discharged due to not returning since the last visit.  Jacqulynn Cadet, PT, DPT

## 2021-08-03 ENCOUNTER — Ambulatory Visit (INDEPENDENT_AMBULATORY_CARE_PROVIDER_SITE_OTHER): Payer: Self-pay | Admitting: *Deleted

## 2021-08-03 DIAGNOSIS — R030 Elevated blood-pressure reading, without diagnosis of hypertension: Secondary | ICD-10-CM | POA: Diagnosis not present

## 2021-08-03 DIAGNOSIS — G894 Chronic pain syndrome: Secondary | ICD-10-CM | POA: Diagnosis not present

## 2021-08-03 DIAGNOSIS — G8929 Other chronic pain: Secondary | ICD-10-CM | POA: Diagnosis not present

## 2021-08-03 DIAGNOSIS — E559 Vitamin D deficiency, unspecified: Secondary | ICD-10-CM | POA: Diagnosis not present

## 2021-08-03 DIAGNOSIS — Z79899 Other long term (current) drug therapy: Secondary | ICD-10-CM | POA: Diagnosis not present

## 2021-08-03 DIAGNOSIS — M25569 Pain in unspecified knee: Secondary | ICD-10-CM | POA: Diagnosis not present

## 2021-08-03 DIAGNOSIS — Z6834 Body mass index (BMI) 34.0-34.9, adult: Secondary | ICD-10-CM | POA: Diagnosis not present

## 2021-08-03 DIAGNOSIS — Z4802 Encounter for removal of sutures: Secondary | ICD-10-CM

## 2021-08-03 NOTE — Progress Notes (Signed)
Patient arrived for nurse visit to remove sutures post-Thoractomy for decortication 11/28 by Dr. Cyndia Bent.  Two sutures removed with no signs or symptoms of infection noted.  Patient tolerated suture removal well.  Incisions well approximated.  Patient states chest tube incisions have been bleeding.  Dark red drainage noted on dressing with scant amount coming from incision.  Thoracotomy incision slightly swollen but soft.  Patient denies SOB.  Advised patient to continue to monitor site and report any increase in swelling, pain, or continual drainage. Patient instructed to keep the incision site clean and dry.  Incisions dressed with sterile gauze.  Patient acknowledged instructions given.  All questions answered.

## 2021-08-07 ENCOUNTER — Inpatient Hospital Stay: Payer: Medicaid Other | Admitting: Family Medicine

## 2021-08-07 DIAGNOSIS — Z79899 Other long term (current) drug therapy: Secondary | ICD-10-CM | POA: Diagnosis not present

## 2021-08-08 ENCOUNTER — Encounter: Payer: Self-pay | Admitting: Family Medicine

## 2021-08-08 ENCOUNTER — Ambulatory Visit: Payer: Medicaid Other | Admitting: Family Medicine

## 2021-08-13 ENCOUNTER — Other Ambulatory Visit: Payer: Self-pay | Admitting: Surgery

## 2021-08-13 DIAGNOSIS — J90 Pleural effusion, not elsewhere classified: Secondary | ICD-10-CM

## 2021-08-13 LAB — CULTURE, FUNGUS WITHOUT SMEAR

## 2021-08-22 ENCOUNTER — Ambulatory Visit (INDEPENDENT_AMBULATORY_CARE_PROVIDER_SITE_OTHER): Payer: Self-pay | Admitting: Physician Assistant

## 2021-08-22 ENCOUNTER — Other Ambulatory Visit: Payer: Self-pay

## 2021-08-22 ENCOUNTER — Ambulatory Visit
Admission: RE | Admit: 2021-08-22 | Discharge: 2021-08-22 | Disposition: A | Discharge status: Home / Self Care | Payer: Medicaid Other | Source: Ambulatory Visit | Attending: Surgery | Admitting: Surgery

## 2021-08-22 VITALS — BP 132/88 | HR 96 | Resp 20 | Ht 71.0 in | Wt 244.0 lb

## 2021-08-22 DIAGNOSIS — R918 Other nonspecific abnormal finding of lung field: Secondary | ICD-10-CM | POA: Diagnosis not present

## 2021-08-22 DIAGNOSIS — J90 Pleural effusion, not elsewhere classified: Secondary | ICD-10-CM | POA: Diagnosis not present

## 2021-08-22 DIAGNOSIS — Z09 Encounter for follow-up examination after completed treatment for conditions other than malignant neoplasm: Secondary | ICD-10-CM

## 2021-08-22 DIAGNOSIS — J869 Pyothorax without fistula: Secondary | ICD-10-CM

## 2021-08-22 DIAGNOSIS — J984 Other disorders of lung: Secondary | ICD-10-CM | POA: Diagnosis not present

## 2021-08-22 NOTE — Progress Notes (Signed)
° °   °  [address]       [place] [address]             [phone]       HPI: Patient returns for routine postoperative follow-up having undergone Muscle Sparing Thoracotomy with drainage of empyema on 07/23/2021 with Dr. Cyndia Bent.  Cultures obtained in the operating room showed no evidence of bacterial growth.  He was discharged home on 07/28/2021.  He was provided a 2 week course of Augmentin per ID recommendations for complete treatment of his pneumonia.  He presents today for follow up.  Since hospital discharge the patient reports he is feeling better overall.  He continues to have some pain along his left side and around into his abdomen that is worse when he has eaten.  He takes pain medication chronically from his physician so he gets relief with it.  He has since quit smoking.  He continues to work on his incentive spirometer.  He denies cough, fevers, shortness of breath.  Current Outpatient Medications  Medication Sig Dispense Refill   albuterol (VENTOLIN HFA) 108 (90 Base) MCG/ACT inhaler Inhale 2 puffs into the lungs every 6 (six) hours as needed for wheezing or shortness of breath. 8 g 2   amitriptyline (ELAVIL) 10 MG tablet Take 10 mg by mouth at bedtime. (Patient not taking: Reported on 08/02/2021)     amLODipine (NORVASC) 10 MG tablet Take 1 tablet (10 mg total) by mouth daily. 90 tablet 3   cholecalciferol (VITAMIN D3) 25 MCG (1000 UNIT) tablet Take 1,000 Units by mouth daily.     docusate sodium (COLACE) 100 MG capsule Take 100 mg by mouth daily.     methocarbamol (ROBAXIN) 500 MG tablet Take 1 tablet (500 mg total) by mouth every 6 (six) hours as needed for muscle spasms. 30 tablet 0   naloxone (NARCAN) nasal spray 4 mg/0.1 mL      oxyCODONE-acetaminophen (PERCOCET) 7.5-325 MG tablet Take 1 tablet by mouth every 6 (six) hours as needed for severe pain.     traZODone (DESYREL) 50 MG tablet Take 50 mg by mouth at bedtime.     No current facility-administered  medications for this visit.    Physical Exam:  BP 132/88    Pulse 96    Resp 20    Ht 5\' 11"  (1.803 m)    Wt 244 lb (110.7 kg)    SpO2 97% Comment: RA   BMI 34.03 kg/m   Gen: NAD Heart: RRR Lungs: mildly diminished left base Wound: well healed, some adhesive remains  Diagnostic Tests:  CXR: continued post surgical changes from VATS.  Continued improvement of pleural effusion and air space disease   A/P:   Patient is S/P Muscle Sparing Thoracotomy with drainage of empyema on 07/23/2021 with Dr. Cyndia Bent.  CXR obtained today shows post surgical changes and improvement of left pleural effusion and air space disease.  He has since quit smoking.  He continues to have some musculoskeletal pain which should continue to improve with time.  RTC prn  Ellwood Handler, PA-C [facility] [phone]

## 2021-09-03 DIAGNOSIS — G894 Chronic pain syndrome: Secondary | ICD-10-CM | POA: Diagnosis not present

## 2021-09-03 DIAGNOSIS — R030 Elevated blood-pressure reading, without diagnosis of hypertension: Secondary | ICD-10-CM | POA: Diagnosis not present

## 2021-09-03 DIAGNOSIS — E559 Vitamin D deficiency, unspecified: Secondary | ICD-10-CM | POA: Diagnosis not present

## 2021-09-03 DIAGNOSIS — M25569 Pain in unspecified knee: Secondary | ICD-10-CM | POA: Diagnosis not present

## 2021-09-03 DIAGNOSIS — Z6833 Body mass index (BMI) 33.0-33.9, adult: Secondary | ICD-10-CM | POA: Diagnosis not present

## 2021-09-03 DIAGNOSIS — G8929 Other chronic pain: Secondary | ICD-10-CM | POA: Diagnosis not present

## 2021-09-03 DIAGNOSIS — Z79899 Other long term (current) drug therapy: Secondary | ICD-10-CM | POA: Diagnosis not present

## 2021-09-06 DIAGNOSIS — Z79899 Other long term (current) drug therapy: Secondary | ICD-10-CM | POA: Diagnosis not present

## 2021-09-06 LAB — ACID FAST CULTURE WITH REFLEXED SENSITIVITIES (MYCOBACTERIA): Acid Fast Culture: NEGATIVE

## 2021-09-17 ENCOUNTER — Encounter: Payer: Self-pay | Admitting: Family Medicine

## 2021-09-17 ENCOUNTER — Other Ambulatory Visit: Payer: Self-pay | Admitting: Family Medicine

## 2021-09-17 ENCOUNTER — Ambulatory Visit (HOSPITAL_COMMUNITY)
Admission: RE | Admit: 2021-09-17 | Discharge: 2021-09-17 | Disposition: A | Discharge status: Home / Self Care | Payer: Medicaid Other | Source: Ambulatory Visit | Attending: Family Medicine | Admitting: Family Medicine

## 2021-09-17 ENCOUNTER — Ambulatory Visit (INDEPENDENT_AMBULATORY_CARE_PROVIDER_SITE_OTHER): Payer: Medicaid Other

## 2021-09-17 ENCOUNTER — Ambulatory Visit: Payer: Medicaid Other | Admitting: Family Medicine

## 2021-09-17 ENCOUNTER — Other Ambulatory Visit: Payer: Self-pay

## 2021-09-17 VITALS — BP 123/84 | HR 88 | Temp 98.1°F | Ht 71.0 in | Wt 243.8 lb

## 2021-09-17 DIAGNOSIS — R0989 Other specified symptoms and signs involving the circulatory and respiratory systems: Secondary | ICD-10-CM

## 2021-09-17 DIAGNOSIS — J869 Pyothorax without fistula: Secondary | ICD-10-CM | POA: Diagnosis not present

## 2021-09-17 DIAGNOSIS — I700 Atherosclerosis of aorta: Secondary | ICD-10-CM | POA: Diagnosis not present

## 2021-09-17 DIAGNOSIS — J90 Pleural effusion, not elsewhere classified: Secondary | ICD-10-CM | POA: Diagnosis not present

## 2021-09-17 DIAGNOSIS — J439 Emphysema, unspecified: Secondary | ICD-10-CM | POA: Diagnosis not present

## 2021-09-17 DIAGNOSIS — J929 Pleural plaque without asbestos: Secondary | ICD-10-CM | POA: Diagnosis not present

## 2021-09-17 MED ORDER — CEFTRIAXONE SODIUM 1 G IJ SOLR
1.0000 g | Freq: Once | INTRAMUSCULAR | Status: AC
  Administered 2021-09-17: 1 g via INTRAMUSCULAR

## 2021-09-17 MED ORDER — MOXIFLOXACIN HCL 400 MG PO TABS: 400.0000 mg | ORAL_TABLET | Freq: Every day | ORAL | 0 refills | Status: DC

## 2021-09-17 MED ORDER — PREDNISONE 10 MG PO TABS: ORAL_TABLET | ORAL | 0 refills | Status: DC

## 2021-09-17 NOTE — Progress Notes (Signed)
Subjective:  Patient ID: Victor Victor [name] Victor [name], male    DOB: Aug 26, 1976  Age: 46 y.o. MRN: [id number]  CC: Chest Congestion   HPI Victor Victor [name] Victor [name] presents for chest pain, sharp, intermittent. nausea, LUQ abd pain constant ache and burning. COughing a lot of green & yellow sputum. Onset 3 weeks ago. Covid negative 2 weeks ago. HAving chills and sweats as nausea. Dyspnea with walking about 100 yards. Difficult to breathe at night. Had left sided empyema in November that required thoracic surgery to drain.   Depression screen [facility] 2/9 09/17/2021 09/17/2021 07/02/2021  Decreased Interest 2 0 0  Down, Depressed, Hopeless 1 0 0  PHQ - 2 Score 3 0 0  Altered sleeping 1 - -  Tired, decreased energy 1 - -  Change in appetite 1 - -  Feeling bad or failure about yourself  0 - -  Trouble concentrating 1 - -  Moving slowly or fidgety/restless 1 - -  Suicidal thoughts 0 - -  PHQ-9 Score 8 - -  Difficult doing work/chores Very difficult - -  Some recent data might be hidden    History Victor Victor [name] Victor [name] has a past medical history of GERD (gastroesophageal reflux disease), History of stomach ulcers, and Hypertension.   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   Victor [name] family history includes Bone cancer in Victor [name] maternal grandmother; Brain cancer in Victor [name] maternal grandfather; CAD in Victor [name] maternal grandfather; Cancer in Victor [name] father and mother; Colon cancer in Victor [name] paternal grandfather; Diabetes in Victor [name] maternal grandmother and mother; Head & neck cancer in Victor [name] brother; Healthy in Victor [name]  sister; Heart failure in Victor [name] mother; Pancreatic cancer in Victor [name] mother; Throat cancer in Victor [name] father.Victor Victor [name] Victor [name] reports that Victor Victor [name] Victor [name] quit smoking about 3 years ago. Victor [name] smoking use included cigarettes. Victor Victor [name] Victor [name] started smoking about 30 years ago. Victor Victor [name] Victor [name] has a 7.50 pack-year smoking history. Victor Victor [name] Victor [name] quit smokeless tobacco use about 3 years ago.  Victor [name] smokeless tobacco use included chew. Victor Victor [name] Victor [name] reports that Victor Victor [name] Victor [name] does not drink alcohol and does not use drugs.    ROS Review of Systems  Constitutional:  Negative for fever.  Respiratory:  Positive for chest tightness and shortness of breath.   Cardiovascular:  Positive for chest pain.  Gastrointestinal:  Positive for abdominal pain (LUQ).  Musculoskeletal:  Negative for arthralgias.  Skin:  Negative for rash.   Objective:  BP 123/84    Pulse 88    Temp 98.1 F (36.7 C)    Ht '5\' 11"'  (1.803 m)    Wt 243 lb 12.8 oz (110.6 kg)    SpO2 96%    BMI 34.00 kg/m   BP Readings from Last 3 Encounters:  09/17/21 123/84  08/22/21 132/88  07/28/21 125/87    Wt Readings from Last 3 Encounters:  09/17/21 243 lb 12.8 oz (110.6 kg)  08/22/21 244 lb (110.7 kg)  07/25/21 243 lb (110.2 kg)     Physical Exam Vitals reviewed.  Constitutional:      Appearance: Victor Victor [name] Victor [name] is well-developed.  HENT:     Head: Normocephalic and atraumatic.     Right Ear: External ear normal.     Left Ear: External ear normal.     Mouth/Throat:  Pharynx: No oropharyngeal exudate or posterior oropharyngeal erythema.  Eyes:     Pupils: Pupils are equal, round, and reactive to light.  Cardiovascular:     Rate and Rhythm: Normal rate and regular rhythm.     Heart sounds: No murmur heard. Pulmonary:     Effort: No respiratory distress.     Breath sounds: Rhonchi and rales (and a rub, LLL) present.  Musculoskeletal:     Cervical back: Normal range of motion and neck supple.  Neurological:     Mental Status: Victor Victor [name] Victor [name] is alert and oriented to person, place, and time.   CXR - LLL infiltrate and segmental collapse.  Preliminary reading done by Randell Loop    Assessment & Plan:   Victor Victor [name] Victor [name] was seen today for chest congestion.  Diagnoses and all orders for this visit:  Empyema ([place]) -     DG Chest 2 View; Future -     CT Chest Wo Contrast; Future -     cefTRIAXone (ROCEPHIN) injection 1 g  Chest congestion -     DG Chest 2 View; Future -     CT Chest Wo Contrast; Future -     cefTRIAXone (ROCEPHIN) injection 1 g  Other orders -     moxifloxacin (AVELOX) 400 MG tablet; Take 1 tablet (400 mg total) by mouth daily. Take all of these, for infection       Victor Victor [name] am having Victor Victor [name] Victor [name]. Victor Victor [name] Victor [name] start on moxifloxacin. Victor Victor [name] Victor [name] maintain Victor [name] methocarbamol, oxyCODONE-acetaminophen, amitriptyline, naloxone, amLODipine, traZODone, cholecalciferol, docusate sodium, and albuterol. We will continue to administer cefTRIAXone.  Allergies as of 09/17/2021   No Known Allergies      Medication List        Accurate as of September 17, 2021  4:37 PM. If you have any questions, ask your nurse or doctor.          albuterol 108 (90 Base) MCG/ACT inhaler Commonly known as: VENTOLIN HFA Inhale 2 puffs into the lungs every 6 (six) hours as needed for wheezing or shortness of breath.   amitriptyline 10 MG tablet Commonly known as: ELAVIL Take 10 mg by mouth at bedtime.   amLODipine 10 MG tablet Commonly known as: NORVASC Take 1 tablet (10 mg total) by mouth daily.   cholecalciferol 25 MCG (1000 UNIT) tablet Commonly known as: VITAMIN D3 Take 1,000 Units by mouth daily.   docusate sodium 100 MG capsule Commonly known as: COLACE Take 100 mg by mouth daily.   methocarbamol 500 MG tablet Commonly known as: ROBAXIN Take 1 tablet (500 mg total) by mouth every 6 (six) hours as needed for muscle spasms.   moxifloxacin 400 MG tablet Commonly known as: Avelox Take 1 tablet (400 mg total) by mouth daily. Take all of these, for infection Started by: Claretta Fraise, MD   naloxone 4 MG/0.1ML Liqd nasal  spray kit Commonly known as: NARCAN   oxyCODONE-acetaminophen 7.5-325 MG tablet Commonly known as: PERCOCET Take 1 tablet by mouth every 6 (six) hours as needed for severe pain.   traZODone 50 MG tablet Commonly known as: DESYREL Take 50 mg by mouth at bedtime.         Follow-up: Return if symptoms worsen or fail to improve.  Claretta Fraise, M.D.

## 2021-09-28 ENCOUNTER — Telehealth: Payer: Self-pay | Admitting: *Deleted

## 2021-09-28 NOTE — Telephone Encounter (Signed)
Patient's wife contacted the office for patient stating he experiences similar symptoms of empyema every time he comes off antibiotics. Patient is s/p thoracotomy for empyema drainage 11/28 by Dr. Cyndia Bent. Returned call to patient who states he was seen a few weeks ago by his PCP and was placed on antibiotics and a steroid. Patient states when he is off medication his cough comes back and he has chest pain. Per patient he had a CT chest 1/23 which showed resolution of empyema. Advised patient I can schedule him an appt for neuropathic pain as he states pain is radiating, however patient states he is already taking Lyrica. Advised patient pain [place] take time to go away. Patient states he will give symptoms longer to resolve. Advised patient to contact PCP for further symptom management of cough. Patient verbalizes understanding.

## 2021-10-09 DIAGNOSIS — M25569 Pain in unspecified knee: Secondary | ICD-10-CM | POA: Diagnosis not present

## 2021-10-09 DIAGNOSIS — G8929 Other chronic pain: Secondary | ICD-10-CM | POA: Diagnosis not present

## 2021-10-09 DIAGNOSIS — Z79899 Other long term (current) drug therapy: Secondary | ICD-10-CM | POA: Diagnosis not present

## 2021-10-09 DIAGNOSIS — G894 Chronic pain syndrome: Secondary | ICD-10-CM | POA: Diagnosis not present

## 2021-10-09 DIAGNOSIS — R030 Elevated blood-pressure reading, without diagnosis of hypertension: Secondary | ICD-10-CM | POA: Diagnosis not present

## 2021-10-09 DIAGNOSIS — Z6834 Body mass index (BMI) 34.0-34.9, adult: Secondary | ICD-10-CM | POA: Diagnosis not present

## 2021-10-12 DIAGNOSIS — Z79899 Other long term (current) drug therapy: Secondary | ICD-10-CM | POA: Diagnosis not present

## 2021-11-06 DIAGNOSIS — R030 Elevated blood-pressure reading, without diagnosis of hypertension: Secondary | ICD-10-CM | POA: Diagnosis not present

## 2021-11-06 DIAGNOSIS — Z6834 Body mass index (BMI) 34.0-34.9, adult: Secondary | ICD-10-CM | POA: Diagnosis not present

## 2021-11-06 DIAGNOSIS — G894 Chronic pain syndrome: Secondary | ICD-10-CM | POA: Diagnosis not present

## 2021-11-06 DIAGNOSIS — M25569 Pain in unspecified knee: Secondary | ICD-10-CM | POA: Diagnosis not present

## 2021-11-06 DIAGNOSIS — G8929 Other chronic pain: Secondary | ICD-10-CM | POA: Diagnosis not present

## 2021-11-06 DIAGNOSIS — Z79899 Other long term (current) drug therapy: Secondary | ICD-10-CM | POA: Diagnosis not present

## 2021-11-12 DIAGNOSIS — Z79899 Other long term (current) drug therapy: Secondary | ICD-10-CM | POA: Diagnosis not present

## 2021-11-19 ENCOUNTER — Encounter: Payer: Self-pay | Admitting: Orthopedic Surgery

## 2021-11-26 ENCOUNTER — Ambulatory Visit: Payer: Medicaid Other

## 2021-11-26 ENCOUNTER — Ambulatory Visit: Payer: Medicaid Other | Admitting: Orthopedic Surgery

## 2021-11-26 DIAGNOSIS — M25461 Effusion, right knee: Secondary | ICD-10-CM | POA: Diagnosis not present

## 2021-11-26 DIAGNOSIS — Z96651 Presence of right artificial knee joint: Secondary | ICD-10-CM

## 2021-11-26 DIAGNOSIS — M1712 Unilateral primary osteoarthritis, left knee: Secondary | ICD-10-CM | POA: Diagnosis not present

## 2021-11-26 DIAGNOSIS — M25561 Pain in right knee: Secondary | ICD-10-CM | POA: Diagnosis not present

## 2021-11-26 DIAGNOSIS — M25562 Pain in left knee: Secondary | ICD-10-CM | POA: Diagnosis not present

## 2021-11-26 LAB — CBC WITH DIFFERENTIAL/PLATELET
Absolute Monocytes: 611 cells/uL (ref 200–950)
Basophils Absolute: 52 cells/uL (ref 0–200)
Basophils Relative: 0.6 %
Eosinophils Absolute: 112 cells/uL (ref 15–500)
Eosinophils Relative: 1.3 %
HCT: 45.1 % (ref 38.5–50.0)
Hemoglobin: 15.1 g/dL (ref 13.2–17.1)
Lymphs Abs: 2494 cells/uL (ref 850–3900)
MCH: 28.3 pg (ref 27.0–33.0)
MCHC: 33.5 g/dL (ref 32.0–36.0)
MCV: 84.6 fL (ref 80.0–100.0)
MPV: 10.4 fL (ref 7.5–12.5)
Monocytes Relative: 7.1 %
Neutro Abs: 5332 cells/uL (ref 1500–7800)
Neutrophils Relative %: 62 %
Platelets: 273 10*3/uL (ref 140–400)
RBC: 5.33 10*6/uL (ref 4.20–5.80)
RDW: 13.7 % (ref 11.0–15.0)
Total Lymphocyte: 29 %
WBC: 8.6 10*3/uL (ref 3.8–10.8)

## 2021-11-26 LAB — SEDIMENTATION RATE: Sed Rate: 6 mm/h (ref 0–15)

## 2021-11-26 MED ORDER — MELOXICAM 7.5 MG PO TABS: 7.5000 mg | ORAL_TABLET | Freq: Every day | ORAL | 5 refills | Status: DC

## 2021-11-26 NOTE — Patient Instructions (Signed)
Rule out infection in prosthetic joint and left knee joint  ? ?Home exercises for the left knee  ? ?And an nsaid  ?

## 2021-11-26 NOTE — Progress Notes (Signed)
NEW PROBLEM//OFFICE VISIT ? ? ?Chief Complaint  ?Patient presents with  ? Follow-up  ?  Bilateral knee pain, Right, DOS 08-08-20.  ? ?46 year old male status post revision right total knee recent bout of pneumonia comes in with bilateral knee pain and swelling ? ?Left knee seems to catch there is some pulling in the back of the knee as well.  Right knee has increased pain although chronic pain was noted even after the revision surgery ? ? ? ? ? ?ROS: No fever or chills normal breathing ?ROS ? ? ?Exam findings ? ? ?General appearance: Well-developed well-nourished no gross deformities ? ?Cardiovascular normal pulse and perfusion normal color without edema ? ?Neurologically no sensation loss or deficits or pathologic reflexes ? ?Psychological: Awake alert and oriented x3 mood and affect normal ? ?Skin no lacerations or ulcerations no nodularity no palpable masses, no erythema or nodularity ? ?Musculoskeletal:  ? ?Right knee flexion contracture 10 degrees flexion to 80 degrees.  Possible effusion ? ?Left knee stiff on extension but I can just about make him get to at least under 5 degrees to full extension and then he can flex to 110 there Allums be an effusion there as well ? ? ? ? ? ? ? ?Past Medical History:  ?Diagnosis Date  ? GERD (gastroesophageal reflux disease)   ? History of stomach ulcers   ? Hypertension   ? ? ?Past Surgical History:  ?Procedure Laterality Date  ? ANTERIOR CRUCIATE LIGAMENT REPAIR Right 05/19/2018  ? Procedure: RIGHT KNEE ARTHROSCOPY WITH ANTERIOR CRUCIATE LIGAMENT (ACL) REPAIR and MEDIAL MENISECTOMY;  Surgeon: [name], MD;  Location: [facility];  Service: Orthopedics;  Laterality: Right;  ? APPENDECTOMY    ? DECORTICATION Left 07/23/2021  ? Procedure: DECORTICATION of left lung;  Surgeon: Gaye Pollack, MD;  Location: [facility] OR;  Service: Thoracic;  Laterality: Left;  ? HARDWARE REMOVAL Right 09/21/2019  ? Procedure: HARDWARE REMOVAL (RICHARD'S STAPLE) RIGHT KNEE;  Surgeon: [name], MD;  Location: [facility];  Service: Orthopedics;  Laterality: Right;  ? IR PERC PLEURAL DRAIN W/INDWELL CATH W/IMG GUIDE  07/19/2021  ? KNEE ARTHROSCOPY WITH MEDIAL MENISECTOMY Right 05/29/2017  ? Procedure: KNEE ARTHROSCOPY WITH MEDIAL MENISECTOMY and lateral meniscectomy;  Surgeon: [name], MD;  Location: [facility];  Service: Orthopedics;  Laterality: Right;  ? KNEE ARTHROSCOPY WITH MEDIAL MENISECTOMY Right 02/05/2018  ? Procedure: RIGHT KNEE ARTHROSCOPY WITH PARTIAL MEDIAL AND LATERAL MENISECTOMY;  Surgeon: [name], MD;  Location: [facility];  Service: Orthopedics;  Laterality: Right;  ? THORACOTOMY/LOBECTOMY Left 07/23/2021  ? Procedure: THORACOTOMY with drainage of empyema;  Surgeon: Gaye Pollack, MD;  Location: [facility] OR;  Service: Thoracic;  Laterality: Left;  ? TOTAL KNEE ARTHROPLASTY Right 03/28/2020  ? Procedure: TOTAL KNEE ARTHROPLASTY;  Surgeon: [name], MD;  Location: [facility];  Service: Orthopedics;  Laterality: Right;  ? TOTAL KNEE REVISION Right 08/08/2020  ? Procedure: REVISION OF RIGHT TOTAL KNEE  TIBIAL COMPONENTS ONLY;  Surgeon: [name], MD;  Location: [facility];  Service: Orthopedics;  Laterality: Right;  ? ? ?Family History  ?Problem Relation Age of Onset  ? Diabetes Mother   ? Heart failure Mother   ? Cancer Mother   ? Pancreatic cancer Mother   ? Cancer Father   ? Throat cancer Father   ? Healthy Sister   ? Head & neck cancer Brother   ? Bone cancer Maternal Grandmother   ? Diabetes Maternal Grandmother   ?  CAD Maternal Grandfather   ? Brain cancer Maternal Grandfather   ? Colon cancer Paternal Grandfather   ? ?Social History  ? ?Tobacco Use  ? Smoking status: Former  ?  Packs/day: 0.50  ?  Years: 15.00  ?  Pack years: 7.50  ?  Types: Cigarettes  ?  Start date: 07/07/1991  ?  Quit date: 04/09/2018  ?  Years since quitting: 3.6  ? Smokeless tobacco: Former  ?  Types: Chew  ?  Quit date: 06/09/2018  ?Vaping Use  ? Vaping Use: Never used  ?Substance Use  Topics  ? Alcohol use: No  ? Drug use: No  ? ? ?No Known Allergies ? ?Current Meds  ?Medication Sig  ? amLODipine (NORVASC) 10 MG tablet Take 1 tablet (10 mg total) by mouth daily.  ? cholecalciferol (VITAMIN D3) 25 MCG (1000 UNIT) tablet Take 1,000 Units by mouth daily.  ? docusate sodium (COLACE) 100 MG capsule Take 100 mg by mouth daily.  ? meloxicam (MOBIC) 7.5 MG tablet Take 1 tablet (7.5 mg total) by mouth daily.  ? methocarbamol (ROBAXIN) 500 MG tablet Take 1 tablet (500 mg total) by mouth every 6 (six) hours as needed for muscle spasms.  ? naloxone (NARCAN) nasal spray 4 mg/0.1 mL   ? oxyCODONE-acetaminophen (PERCOCET) 7.5-325 MG tablet Take 1 tablet by mouth every 6 (six) hours as needed for severe pain.  ? traZODone (DESYREL) 50 MG tablet Take 50 mg by mouth at bedtime.  ? [DISCONTINUED] albuterol (VENTOLIN HFA) 108 (90 Base) MCG/ACT inhaler Inhale 2 puffs into the lungs every 6 (six) hours as needed for wheezing or shortness of breath.  ? ? ? ?MEDICAL DECISION MAKING ? ?A.  ?Encounter Diagnoses  ?Name Primary?  ? Pain in both knees, unspecified chronicity Yes  ? Effusion, right knee   ? 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 ? ?B. DATA ANALYSED: ? ? ?IMAGING: ?Interpretation of images: I have personally reviewed the images and my interpretation is x-rays of the knees right knee shows a stable right total knee prosthesis no evidence of loosening ? ?There is a extended stem but is a short stem on the tibial side ? ?And then on the left knee there is grade 1 arthritis ? ? ?Orders: CBC sed rate C-reactive protein to rule out periprosthetic joint infection ? ?Outside records reviewed: None ? ? ?C. MANAGEMENT  ? ?Recommend knee exercises ? ?Recommend anti-inflammatory ? ?Recommend laboratory work to rule out infection in the prosthesis which [place] have seeded from the pneumonia ? ?Follow-up in 6 weeks to readdress the knee issue ? ? ? ?Meds ordered this encounter  ?Medications  ? meloxicam (MOBIC)  7.5 MG tablet  ?  Sig: Take 1 tablet (7.5 mg total) by mouth daily.  ?  Dispense:  30 tablet  ?  Refill:  5  ? ? ? ?Arther Abbott, MD ? ?11/26/2021 ?2:49 PM ?

## 2021-12-11 DIAGNOSIS — R030 Elevated blood-pressure reading, without diagnosis of hypertension: Secondary | ICD-10-CM | POA: Diagnosis not present

## 2021-12-11 DIAGNOSIS — Z6833 Body mass index (BMI) 33.0-33.9, adult: Secondary | ICD-10-CM | POA: Diagnosis not present

## 2021-12-11 DIAGNOSIS — Z79899 Other long term (current) drug therapy: Secondary | ICD-10-CM | POA: Diagnosis not present

## 2021-12-11 DIAGNOSIS — M25569 Pain in unspecified knee: Secondary | ICD-10-CM | POA: Diagnosis not present

## 2021-12-11 DIAGNOSIS — G8929 Other chronic pain: Secondary | ICD-10-CM | POA: Diagnosis not present

## 2021-12-11 DIAGNOSIS — G894 Chronic pain syndrome: Secondary | ICD-10-CM | POA: Diagnosis not present

## 2021-12-13 DIAGNOSIS — Z79899 Other long term (current) drug therapy: Secondary | ICD-10-CM | POA: Diagnosis not present

## 2022-01-07 ENCOUNTER — Ambulatory Visit: Payer: Medicaid Other | Admitting: Orthopedic Surgery

## 2022-01-07 DIAGNOSIS — T84012S Broken internal right knee prosthesis, sequela: Secondary | ICD-10-CM

## 2022-01-07 DIAGNOSIS — G8929 Other chronic pain: Secondary | ICD-10-CM | POA: Diagnosis not present

## 2022-01-07 DIAGNOSIS — M23322 Other meniscus derangements, posterior horn of medial meniscus, left knee: Secondary | ICD-10-CM | POA: Diagnosis not present

## 2022-01-07 DIAGNOSIS — M24661 Ankylosis, right knee: Secondary | ICD-10-CM | POA: Diagnosis not present

## 2022-01-07 DIAGNOSIS — M25562 Pain in left knee: Secondary | ICD-10-CM | POA: Diagnosis not present

## 2022-01-07 NOTE — Patient Instructions (Signed)
While we are working on your approval for MRI please go ahead and call to schedule your appointment with [facility] within at least one (1) week.   Central Scheduling [phone]  

## 2022-01-07 NOTE — Progress Notes (Signed)
Chief Complaint  ?Patient presents with  ? Knee Pain  ?  Bilateral, Left a little worse and right the pain continues to get worse, both catching, locking, and giving away  ? ?46 year old male status post revision right total knee recent bout of pneumonia comes in with bilateral knee pain and swelling ?  ?Left knee seems to catch there is some pulling in the back of the knee as well.  Right knee has increased pain although chronic pain was noted even after the revision surgery ? ?ESR 6  ?WBC 8.6 Labs dated April 3 ? ?No improvement in the left knee ? ?Recommend MRI per Medicaid guidelines he has had 6 weeks of NSAIDs ? ?Regarding the right knee recommend second opinion for possible revision ? ?Exam of the left knee shows continued medial joint line tenderness with small effusion flexion is 110 he has a 10 degree lack of extension ?No instability detected ? ? ?Right knee flexion 95 extension -40 ?No warmth or erythema ?Stability normal ? ?Recommend MRI left knee ? ?Repeat CRP ? ?As to the right knee he agreed to referral for possible revision ? ? ?Encounter Diagnoses  ?Name Primary?  ? Chronic pain of left knee Yes  ? Other meniscus derangements, posterior horn of medial meniscus, left knee   ? Arthrofibrosis of knee joint, right   ? Failed total right knee replacement, sequela   ? ? ?Addendum: crp 8.7 ?

## 2022-01-08 ENCOUNTER — Encounter: Payer: Self-pay | Admitting: Orthopedic Surgery

## 2022-01-08 LAB — C-REACTIVE PROTEIN: CRP: 8.7 mg/L — ABNORMAL HIGH (ref <8.0)

## 2022-01-08 NOTE — Addendum Note (Signed)
Addended by: Obie Dredge A on: 01/08/2022 07:53 AM ? ? Modules accepted: Orders ? ?

## 2022-01-10 DIAGNOSIS — Z6833 Body mass index (BMI) 33.0-33.9, adult: Secondary | ICD-10-CM | POA: Diagnosis not present

## 2022-01-10 DIAGNOSIS — G894 Chronic pain syndrome: Secondary | ICD-10-CM | POA: Diagnosis not present

## 2022-01-10 DIAGNOSIS — G8929 Other chronic pain: Secondary | ICD-10-CM | POA: Diagnosis not present

## 2022-01-10 DIAGNOSIS — R030 Elevated blood-pressure reading, without diagnosis of hypertension: Secondary | ICD-10-CM | POA: Diagnosis not present

## 2022-01-10 DIAGNOSIS — Z79899 Other long term (current) drug therapy: Secondary | ICD-10-CM | POA: Diagnosis not present

## 2022-01-10 DIAGNOSIS — M25569 Pain in unspecified knee: Secondary | ICD-10-CM | POA: Diagnosis not present

## 2022-01-15 DIAGNOSIS — Z79899 Other long term (current) drug therapy: Secondary | ICD-10-CM | POA: Diagnosis not present

## 2022-01-17 ENCOUNTER — Telehealth: Payer: Self-pay | Admitting: Radiology

## 2022-01-17 NOTE — Telephone Encounter (Signed)
Referral was sent  to [facility] patient can call them to schedule the number is [phone]. They will need patient to get a CD of the xray images from [facility]. The number to call for the CD is [phone] ask for [facility]  I have called patient to advise of these things, to make sure he is aware   Left message told him I will send a mychart message too.

## 2022-01-24 ENCOUNTER — Ambulatory Visit (HOSPITAL_COMMUNITY)
Admission: RE | Admit: 2022-01-24 | Discharge: 2022-01-24 | Disposition: A | Discharge status: Home / Self Care | Payer: Medicaid Other | Source: Ambulatory Visit | Attending: Orthopedic Surgery | Admitting: Orthopedic Surgery

## 2022-01-24 DIAGNOSIS — G8929 Other chronic pain: Secondary | ICD-10-CM | POA: Diagnosis not present

## 2022-01-24 DIAGNOSIS — M25562 Pain in left knee: Secondary | ICD-10-CM | POA: Insufficient documentation

## 2022-01-30 ENCOUNTER — Ambulatory Visit: Payer: Medicaid Other | Admitting: Orthopedic Surgery

## 2022-01-30 ENCOUNTER — Encounter: Payer: Self-pay | Admitting: Orthopedic Surgery

## 2022-01-30 DIAGNOSIS — M25562 Pain in left knee: Secondary | ICD-10-CM | POA: Diagnosis not present

## 2022-01-30 DIAGNOSIS — M23322 Other meniscus derangements, posterior horn of medial meniscus, left knee: Secondary | ICD-10-CM

## 2022-01-30 DIAGNOSIS — G8929 Other chronic pain: Secondary | ICD-10-CM | POA: Diagnosis not present

## 2022-01-30 NOTE — Progress Notes (Signed)
FOLLOW UP   Encounter Diagnoses  Name Primary?   Chronic pain of left knee Yes   Other meniscus derangements, posterior horn of medial meniscus, left knee     Victor [name] has continued pain and catching in the left knee, he doesn't remember a major injury to the left knee   Chief Complaint  Patient presents with   Results    Left knee MRI review      MRI : My reading of the MRI images is   There is a medial meniscus tear with Poss acl tear   We discussed options including non surgical; he wants tp proceed w surgery   Rec Surgery   Medial menisectomy and EUA left knee.

## 2022-01-31 DIAGNOSIS — R030 Elevated blood-pressure reading, without diagnosis of hypertension: Secondary | ICD-10-CM | POA: Diagnosis not present

## 2022-01-31 DIAGNOSIS — M25569 Pain in unspecified knee: Secondary | ICD-10-CM | POA: Diagnosis not present

## 2022-01-31 DIAGNOSIS — G894 Chronic pain syndrome: Secondary | ICD-10-CM | POA: Diagnosis not present

## 2022-01-31 DIAGNOSIS — Z79899 Other long term (current) drug therapy: Secondary | ICD-10-CM | POA: Diagnosis not present

## 2022-01-31 DIAGNOSIS — Z6834 Body mass index (BMI) 34.0-34.9, adult: Secondary | ICD-10-CM | POA: Diagnosis not present

## 2022-01-31 DIAGNOSIS — G8929 Other chronic pain: Secondary | ICD-10-CM | POA: Diagnosis not present

## 2022-02-05 DIAGNOSIS — Z79899 Other long term (current) drug therapy: Secondary | ICD-10-CM | POA: Diagnosis not present

## 2022-03-04 ENCOUNTER — Encounter: Payer: Self-pay | Admitting: Radiology

## 2022-03-04 ENCOUNTER — Encounter: Payer: Self-pay | Admitting: Orthopedic Surgery

## 2022-03-04 DIAGNOSIS — Z96659 Presence of unspecified artificial knee joint: Secondary | ICD-10-CM | POA: Diagnosis not present

## 2022-03-04 DIAGNOSIS — Z9181 History of falling: Secondary | ICD-10-CM | POA: Diagnosis not present

## 2022-03-04 DIAGNOSIS — R030 Elevated blood-pressure reading, without diagnosis of hypertension: Secondary | ICD-10-CM | POA: Diagnosis not present

## 2022-03-04 DIAGNOSIS — G4700 Insomnia, unspecified: Secondary | ICD-10-CM | POA: Diagnosis not present

## 2022-03-04 DIAGNOSIS — G894 Chronic pain syndrome: Secondary | ICD-10-CM | POA: Diagnosis not present

## 2022-03-04 DIAGNOSIS — Z6833 Body mass index (BMI) 33.0-33.9, adult: Secondary | ICD-10-CM | POA: Diagnosis not present

## 2022-03-04 DIAGNOSIS — M25569 Pain in unspecified knee: Secondary | ICD-10-CM | POA: Diagnosis not present

## 2022-03-04 DIAGNOSIS — G8929 Other chronic pain: Secondary | ICD-10-CM | POA: Diagnosis not present

## 2022-03-04 DIAGNOSIS — Z79899 Other long term (current) drug therapy: Secondary | ICD-10-CM | POA: Diagnosis not present

## 2022-03-04 DIAGNOSIS — E559 Vitamin D deficiency, unspecified: Secondary | ICD-10-CM | POA: Diagnosis not present

## 2022-03-06 ENCOUNTER — Other Ambulatory Visit: Payer: Self-pay | Admitting: Orthopedic Surgery

## 2022-03-06 DIAGNOSIS — R7989 Other specified abnormal findings of blood chemistry: Secondary | ICD-10-CM

## 2022-03-06 DIAGNOSIS — M23322 Other meniscus derangements, posterior horn of medial meniscus, left knee: Secondary | ICD-10-CM

## 2022-03-06 DIAGNOSIS — Z79899 Other long term (current) drug therapy: Secondary | ICD-10-CM | POA: Diagnosis not present

## 2022-03-06 DIAGNOSIS — E876 Hypokalemia: Secondary | ICD-10-CM

## 2022-03-06 DIAGNOSIS — I10 Essential (primary) hypertension: Secondary | ICD-10-CM

## 2022-03-06 DIAGNOSIS — Z01818 Encounter for other preprocedural examination: Secondary | ICD-10-CM

## 2022-03-13 ENCOUNTER — Encounter: Payer: Self-pay | Admitting: Orthopedic Surgery

## 2022-03-14 DIAGNOSIS — T8484XA Pain due to internal orthopedic prosthetic devices, implants and grafts, initial encounter: Secondary | ICD-10-CM | POA: Insufficient documentation

## 2022-03-27 ENCOUNTER — Encounter: Payer: Medicare Other | Admitting: Orthopedic Surgery

## 2022-04-03 ENCOUNTER — Other Ambulatory Visit: Payer: Self-pay | Admitting: Orthopedic Surgery

## 2022-04-03 DIAGNOSIS — M23322 Other meniscus derangements, posterior horn of medial meniscus, left knee: Secondary | ICD-10-CM

## 2022-04-03 DIAGNOSIS — G8929 Other chronic pain: Secondary | ICD-10-CM

## 2022-04-10 ENCOUNTER — Other Ambulatory Visit: Payer: Self-pay | Admitting: Orthopedic Surgery

## 2022-04-18 NOTE — Patient Instructions (Signed)
Victor [name]  04/18/2022     '@[email or web address]'$ @   Your procedure is scheduled on  04/23/2022.   [facility]   Call this number if you have problems the morning of surgery:  [phone]   Remember:  Do not eat or drink after midnight.      Take these medicines the morning of surgery with A SIP OF WATER                    norvasc, robaxin (if needed), percocet, lyrica.     Do not wear jewelry, make-up or nail polish.  Do not wear lotions, powders, or perfumes, or deodorant.  Do not shave 48 hours prior to surgery.  Men Mcandrew shave face and neck.  Do not bring valuables to the hospital.  [facility] is not responsible for any belongings or valuables.  Contacts, dentures or bridgework Kendall not be worn into surgery.  Leave your suitcase in the car.  After surgery it Hornik be brought to your room.  For patients admitted to the hospital, discharge time will be determined by your treatment team.  Patients discharged the day of surgery will not be allowed to drive home and must have someone with them for 24 hours.    Special instructions:   DO NOT smoke tobacco or vape for 24 hours before your procedure.  Please read over the following fact sheets that you were given. Pain Booklet, Coughing and Deep Breathing, Surgical Site Infection Prevention, Anesthesia Post-op Instructions, and Care and Recovery After Surgery      Arthroscopic Knee Ligament Repair, Care After This sheet gives you information about how to care for yourself after your procedure. Your health care provider Portal also give you more specific instructions. If you have problems or questions, contact your health care provider. What can I expect after the procedure? After the procedure, it is common to have: Soreness or pain in your knee. Bruising and swelling on your knee, calf, and ankle for 3-4 days. A small amount of fluid coming from the incisions. Follow these instructions at  home: Medicines Take over-the-counter and prescription medicines only as told by your health care provider. Ask your health care provider if the medicine prescribed to you: Requires you to avoid driving or using machinery. Can cause constipation. You Nienhaus need to take these actions to prevent or treat constipation: Drink enough fluid to keep your urine pale yellow. Take over-the-counter or prescription medicines. Eat foods that are high in fiber, such as beans, whole grains, and fresh fruits and vegetables. Limit foods that are high in fat and processed sugars, such as fried or sweet foods. If you have a brace or immobilizer: Wear it as told by your health care provider. Remove it only as told by your health care provider. Loosen it if your toes tingle, become numb, or turn cold and blue. Keep it clean and dry. Ask your health care provider when it is safe to drive. Bathing Do not take baths, swim, or use a hot tub until your health care provider approves. Keep your bandage (dressing) dry until your health care provider says that it can be removed. If the brace or immobilizer is not waterproof: Do not let it get wet. Cover it with a watertight covering when you take a bath or shower. Incision care  Follow instructions from your health care provider about how to take care  of your incisions. Make sure you: Wash your hands with soap and water for at least 20 seconds before and after you change your dressing. If soap and water are not available, use hand sanitizer. Change your dressing as told by your health care provider. Leave stitches (sutures), skin glue, or adhesive strips in place. These skin closures Radke need to stay in place for 2 weeks or longer. If adhesive strip edges start to loosen and curl up, you Leedom trim the loose edges. Do not remove adhesive strips completely unless your health care provider tells you to do that. Check your incision areas every day for signs of infection.  Check for: Redness. More swelling or pain. Blood or more fluid. Warmth. Pus or a bad smell. Managing pain, stiffness, and swelling  If directed, put ice on the affected area. To do this: If you have a removable brace or immobilizer, remove it as told by your health care provider. Put ice in a plastic bag. Place a towel between your skin and the bag. Leave the ice on for 20 minutes, 2-3 times a day. Remove the ice if your skin turns bright red. This is very important. If you cannot feel pain, heat, or cold, you have a greater risk of damage to the area. Move your toes often to reduce stiffness and swelling. Raise (elevate) the injured area above the level of your heart while you are sitting or lying down. Activity Do not use your knee to support your body weight until your health care provider says that you can. Use crutches or other devices as told by your health care provider. Do physical therapy exercises as told by your health care provider. Physical therapy will help you regain movement and strength in your knee. Follow instructions from your health care provider about: When you Deloatch start motion exercises. When you Kilfoyle start riding a stationary bike and doing other low-impact activities. When you Floresca start to jog and do other high-impact activities. Do not lift anything that is heavier than 10 lb (4.5 kg), or the limit that you are told, until your health care provider says that it is safe. Ask your health care provider what activities are safe for you. General instructions Do not use any products that contain nicotine or tobacco, such as cigarettes, e-cigarettes, and chewing tobacco. These can delay healing. If you need help quitting, ask your health care provider. Wear compression stockings as told by your health care provider. These stockings help to prevent blood clots and reduce swelling in your legs. Keep all follow-up visits. This is important. Contact a health care provider  if: You have any of these signs of infection: Redness around an incision. Blood or more fluid coming from an incision. Warmth coming from an incision. Pus or a bad smell coming from an incision. More swelling or pain in your knee. A fever or chills. You have pain that does not get better with medicine. Your incision opens up. Get help right away if: You have trouble breathing. You have chest pain. You have increased pain or swelling in your calf or at the back of your knee. You have numbness and tingling near the knee joint or in the foot, ankle, or toes. You notice that your foot or toes look darker than normal or are cooler than normal. These symptoms Romaniello represent a serious problem that is an emergency. Do not wait to see if the symptoms will go away. Get medical help right away. Call your local  emergency services (911 in the [place] Do not drive yourself to the hospital. Summary After the procedure, it is common to have knee pain with bruising and swelling on your knee, calf, and ankle. Icing your knee and raising your leg above the level of your heart will help control the pain and swelling. Do physical therapy exercises as told by your health care provider. Physical therapy will help you regain movement and strength in your knee. This information is not intended to replace advice given to you by your health care provider. Make sure you discuss any questions you have with your health care provider. Document Revised: 01/10/2020 Document Reviewed: 01/10/2020 Elsevier Patient Education  [place] Anesthesia, Adult, Care After The following information offers guidance on how to care for yourself after your procedure. Your health care provider Koziel also give you more specific instructions. If you have problems or questions, contact your health care provider. What can I expect after the procedure? After the procedure, it is common for people to: Have pain or discomfort at  the IV site. Have nausea or vomiting. Have a sore throat or hoarseness. Have trouble concentrating. Feel cold or chills. Feel weak, sleepy, or tired (fatigue). Have soreness and body aches. These can affect parts of the body that were not involved in surgery. Follow these instructions at home: For the time period you were told by your health care provider:  Rest. Do not participate in activities where you could fall or become injured. Do not drive or use machinery. Do not drink alcohol. Do not take sleeping pills or medicines that cause drowsiness. Do not make important decisions or sign legal documents. Do not take care of children on your own. General instructions Drink enough fluid to keep your urine pale yellow. If you have sleep apnea, surgery and certain medicines can increase your risk for breathing problems. Follow instructions from your health care provider about wearing your sleep device: Anytime you are sleeping, including during daytime naps. While taking prescription pain medicines, sleeping medicines, or medicines that make you drowsy. Return to your normal activities as told by your health care provider. Ask your health care provider what activities are safe for you. Take over-the-counter and prescription medicines only as told by your health care provider. Do not use any products that contain nicotine or tobacco. These products include cigarettes, chewing tobacco, and vaping devices, such as e-cigarettes. These can delay incision healing after surgery. If you need help quitting, ask your health care provider. Contact a health care provider if: You have nausea or vomiting that does not get better with medicine. You vomit every time you eat or drink. You have pain that does not get better with medicine. You cannot urinate or have bloody urine. You develop a skin rash. You have a fever. Get help right away if: You have trouble breathing. You have chest pain. You vomit  blood. These symptoms Chronis be an emergency. Get help right away. Call 911. Do not wait to see if the symptoms will go away. Do not drive yourself to the hospital. Summary After the procedure, it is common to have a sore throat, hoarseness, nausea, vomiting, or to feel weak, sleepy, or fatigue. For the time period you were told by your health care provider, do not drive or use machinery. Get help right away if you have difficulty breathing, have chest pain, or vomit blood. These symptoms Nemes be an emergency. This information is not intended to replace advice given to  you by your health care provider. Make sure you discuss any questions you have with your health care provider. Document Revised: 11/09/2021 Document Reviewed: 11/09/2021 Elsevier Patient Education  [place]. How to Use Chlorhexidine Before Surgery Chlorhexidine gluconate (CHG) is a germ-killing (antiseptic) solution that is used to clean the skin. It can get rid of the bacteria that normally live on the skin and can keep them away for about 24 hours. To clean your skin with CHG, you Raimondi be given: A CHG solution to use in the shower or as part of a sponge bath. A prepackaged cloth that contains CHG. Cleaning your skin with CHG Shindler help lower the risk for infection: While you are staying in the intensive care unit of the hospital. If you have a vascular access, such as a central line, to provide short-term or long-term access to your veins. If you have a catheter to drain urine from your bladder. If you are on a ventilator. A ventilator is a machine that helps you breathe by moving air in and out of your lungs. After surgery. What are the risks? Risks of using CHG include: A skin reaction. Hearing loss, if CHG gets in your ears and you have a perforated eardrum. Eye injury, if CHG gets in your eyes and is not rinsed out. The CHG product catching fire. Make sure that you avoid smoking and flames after applying CHG to your  skin. Do not use CHG: If you have a chlorhexidine allergy or have previously reacted to chlorhexidine. On babies younger than 70 months of age. How to use CHG solution Use CHG only as told by your health care provider, and follow the instructions on the label. Use the full amount of CHG as directed. Usually, this is one bottle. During a shower Follow these steps when using CHG solution during a shower (unless your health care provider gives you different instructions): Start the shower. Use your normal soap and shampoo to wash your face and hair. Turn off the shower or move out of the shower stream. Pour the CHG onto a clean washcloth. Do not use any type of brush or rough-edged sponge. Starting at your neck, lather your body down to your toes. Make sure you follow these instructions: If you will be having surgery, pay special attention to the part of your body where you will be having surgery. Scrub this area for at least 1 minute. Do not use CHG on your head or face. If the solution gets into your ears or eyes, rinse them well with water. Avoid your genital area. Avoid any areas of skin that have broken skin, cuts, or scrapes. Scrub your back and under your arms. Make sure to wash skin folds. Let the lather sit on your skin for 1-2 minutes or as long as told by your health care provider. Thoroughly rinse your entire body in the shower. Make sure that all body creases and crevices are rinsed well. Dry off with a clean towel. Do not put any substances on your body afterward--such as powder, lotion, or perfume--unless you are told to do so by your health care provider. Only use lotions that are recommended by the manufacturer. Put on clean clothes or pajamas. If it is the night before your surgery, sleep in clean sheets.  During a sponge bath Follow these steps when using CHG solution during a sponge bath (unless your health care provider gives you different instructions): Use your normal  soap and shampoo to wash your  face and hair. Pour the CHG onto a clean washcloth. Starting at your neck, lather your body down to your toes. Make sure you follow these instructions: If you will be having surgery, pay special attention to the part of your body where you will be having surgery. Scrub this area for at least 1 minute. Do not use CHG on your head or face. If the solution gets into your ears or eyes, rinse them well with water. Avoid your genital area. Avoid any areas of skin that have broken skin, cuts, or scrapes. Scrub your back and under your arms. Make sure to wash skin folds. Let the lather sit on your skin for 1-2 minutes or as long as told by your health care provider. Using a different clean, wet washcloth, thoroughly rinse your entire body. Make sure that all body creases and crevices are rinsed well. Dry off with a clean towel. Do not put any substances on your body afterward--such as powder, lotion, or perfume--unless you are told to do so by your health care provider. Only use lotions that are recommended by the manufacturer. Put on clean clothes or pajamas. If it is the night before your surgery, sleep in clean sheets. How to use CHG prepackaged cloths Only use CHG cloths as told by your health care provider, and follow the instructions on the label. Use the CHG cloth on clean, dry skin. Do not use the CHG cloth on your head or face unless your health care provider tells you to. When washing with the CHG cloth: Avoid your genital area. Avoid any areas of skin that have broken skin, cuts, or scrapes. Before surgery Follow these steps when using a CHG cloth to clean before surgery (unless your health care provider gives you different instructions): Using the CHG cloth, vigorously scrub the part of your body where you will be having surgery. Scrub using a back-and-forth motion for 3 minutes. The area on your body should be completely wet with CHG when you are done  scrubbing. Do not rinse. Discard the cloth and let the area air-dry. Do not put any substances on the area afterward, such as powder, lotion, or perfume. Put on clean clothes or pajamas. If it is the night before your surgery, sleep in clean sheets.  For general bathing Follow these steps when using CHG cloths for general bathing (unless your health care provider gives you different instructions). Use a separate CHG cloth for each area of your body. Make sure you wash between any folds of skin and between your fingers and toes. Wash your body in the following order, switching to a new cloth after each step: The front of your neck, shoulders, and chest. Both of your arms, under your arms, and your hands. Your stomach and groin area, avoiding the genitals. Your right leg and foot. Your left leg and foot. The back of your neck, your back, and your buttocks. Do not rinse. Discard the cloth and let the area air-dry. Do not put any substances on your body afterward--such as powder, lotion, or perfume--unless you are told to do so by your health care provider. Only use lotions that are recommended by the manufacturer. Put on clean clothes or pajamas. Contact a health care provider if: Your skin gets irritated after scrubbing. You have questions about using your solution or cloth. You swallow any chlorhexidine. Call your local poison control center (1-727-542-0160 in the [place] Get help right away if: Your eyes itch badly, or they become very red  or swollen. Your skin itches badly and is red or swollen. Your hearing changes. You have trouble seeing. You have swelling or tingling in your mouth or throat. You have trouble breathing. These symptoms Mauri represent a serious problem that is an emergency. Do not wait to see if the symptoms will go away. Get medical help right away. Call your local emergency services (911 in the [place] Do not drive yourself to the hospital. Summary Chlorhexidine  gluconate (CHG) is a germ-killing (antiseptic) solution that is used to clean the skin. Cleaning your skin with CHG [place]street help to lower your risk for infection. You Gilcrease be given CHG to use for bathing. It Knoles be in a bottle or in a prepackaged cloth to use on your skin. Carefully follow your health care provider's instructions and the instructions on the product label. Do not use CHG if you have a chlorhexidine allergy. Contact your health care provider if your skin gets irritated after scrubbing. This information is not intended to replace advice given to you by your health care provider. Make sure you discuss any questions you have with your health care provider. Document Revised: 12/10/2021 Document Reviewed: 10/23/2020 Elsevier Patient Education  [place].

## 2022-04-19 ENCOUNTER — Encounter (HOSPITAL_COMMUNITY): Payer: Self-pay

## 2022-04-19 ENCOUNTER — Encounter (HOSPITAL_COMMUNITY)
Admission: RE | Admit: 2022-04-19 | Discharge: 2022-04-19 | Disposition: A | Discharge status: Home / Self Care | Payer: Medicare Other | Source: Ambulatory Visit | Attending: Orthopedic Surgery | Admitting: Orthopedic Surgery

## 2022-04-19 DIAGNOSIS — Z01818 Encounter for other preprocedural examination: Secondary | ICD-10-CM | POA: Diagnosis present

## 2022-04-19 DIAGNOSIS — R7989 Other specified abnormal findings of blood chemistry: Secondary | ICD-10-CM | POA: Diagnosis not present

## 2022-04-19 DIAGNOSIS — M23322 Other meniscus derangements, posterior horn of medial meniscus, left knee: Secondary | ICD-10-CM | POA: Diagnosis not present

## 2022-04-19 DIAGNOSIS — I10 Essential (primary) hypertension: Secondary | ICD-10-CM | POA: Insufficient documentation

## 2022-04-19 DIAGNOSIS — E876 Hypokalemia: Secondary | ICD-10-CM | POA: Insufficient documentation

## 2022-04-19 LAB — BASIC METABOLIC PANEL
Anion gap: 7 (ref 5–15)
BUN: 14 mg/dL (ref 6–20)
CO2: 27 mmol/L (ref 22–32)
Calcium: 8.8 mg/dL — ABNORMAL LOW (ref 8.9–10.3)
Chloride: 106 mmol/L (ref 98–111)
Creatinine, Ser: 0.97 mg/dL (ref 0.61–1.24)
GFR, Estimated: >60 mL/min (ref >60)
Glucose, Bld: 84 mg/dL (ref 70–99)
Potassium: 3.5 mmol/L (ref 3.5–5.1)
Sodium: 140 mmol/L (ref 135–145)

## 2022-04-19 NOTE — H&P (Signed)
History and physical for outpatient surgery for left knee arthroscopy and partial medial meniscectomy with exam under anesthesia  The patient has been thoroughly evaluated and agreed to proceed with arthroscopic surgery of the left knee and exam under anesthesia  Victor [name] is an 46 y.o. male.    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  Over the course of several months he noticed increased pain and catching in his left knee.  After clinical evaluation he was sent for MRI.  He has an MRI which shows he has a possible ACL tear and a torn medial meniscus  He did not injure the knee so it is unclear why the ACL could be torn    Past Medical History:  Diagnosis Date   GERD (gastroesophageal reflux disease)    History of stomach ulcers    Hypertension     Past Surgical History:  Procedure Laterality Date   ANTERIOR CRUCIATE LIGAMENT REPAIR Right 05/19/2018   Procedure: RIGHT KNEE ARTHROSCOPY WITH ANTERIOR CRUCIATE LIGAMENT (ACL) REPAIR and MEDIAL MENISECTOMY;  Surgeon: [name], MD;  Location: [facility];  Service: Orthopedics;  Laterality: Right;   APPENDECTOMY     DECORTICATION Left 07/23/2021   Procedure: DECORTICATION of left lung;  Surgeon: Gaye Pollack, MD;  Location: [facility] OR;  Service: Thoracic;  Laterality: Left;   HARDWARE REMOVAL Right 09/21/2019   Procedure: HARDWARE REMOVAL (RICHARD'S STAPLE) RIGHT KNEE;  Surgeon: [name], MD;  Location: [facility];  Service: Orthopedics;  Laterality: Right;   IR PERC PLEURAL DRAIN W/INDWELL CATH W/IMG GUIDE  07/19/2021   KNEE ARTHROSCOPY WITH MEDIAL MENISECTOMY Right 05/29/2017   Procedure: KNEE ARTHROSCOPY WITH MEDIAL MENISECTOMY and lateral meniscectomy;  Surgeon: [name], MD;  Location: [facility];  Service: Orthopedics;  Laterality: Right;   KNEE ARTHROSCOPY WITH MEDIAL MENISECTOMY Right 02/05/2018   Procedure: RIGHT KNEE ARTHROSCOPY WITH PARTIAL MEDIAL AND LATERAL MENISECTOMY;  Surgeon: [name], MD;  Location: [facility];  Service: Orthopedics;  Laterality: Right;   THORACOTOMY/LOBECTOMY Left 07/23/2021   Procedure: THORACOTOMY with drainage of empyema;  Surgeon: Gaye Pollack, MD;  Location: [facility] OR;  Service: Thoracic;  Laterality: Left;   TOTAL KNEE ARTHROPLASTY Right 03/28/2020   Procedure: TOTAL KNEE ARTHROPLASTY;  Surgeon: [name], MD;  Location: [facility];  Service: Orthopedics;  Laterality: Right;   TOTAL KNEE REVISION Right 08/08/2020   Procedure: REVISION OF RIGHT TOTAL KNEE  TIBIAL COMPONENTS ONLY;  Surgeon: [name], MD;  Location: [facility];  Service: Orthopedics;  Laterality: Right;    Family History  Problem Relation Age of Onset   Diabetes Mother    Heart failure Mother    Cancer Mother    Pancreatic cancer Mother    Cancer Father    Throat cancer Father    Healthy Sister    Head & neck cancer Brother    Bone cancer Maternal Grandmother    Diabetes Maternal Grandmother    CAD Maternal Grandfather    Brain cancer Maternal Grandfather    Colon cancer Paternal Grandfather    Social History:  reports that he quit smoking about 4 years ago. His smoking use included cigarettes. He started smoking about 30 years ago. He has a 7.50 pack-year smoking  history. He quit smokeless tobacco use about 3 years ago.  His smokeless tobacco use included chew. He reports that he does not drink alcohol and does not use drugs.  Allergies: No Known Allergies  No medications prior to admission.    No results found for this or any previous visit (from the past 48 hour(s)). No results found.  Review of Systems no fever chills congestion shortness of breath chest pain abdominal pain temperature intolerance or dysuria.  Radicular-like symptoms in the right  lower extremity no history of easy bruising or bleeding no history of depression no food allergies   Physical Exam  General appearance Victor [name] is a healthy-appearing male.  Body habitus mesomorphic  Cardiovascular exam reveals no swelling or varicose veins palpation of pulses were intact extremity temperature normal without edema or tenderness  His neuropsychiatric findings included normal coordination normal deep tendon reflexes he does have some altered sensation in his right lower extremity which have been unquantifiable as they do not follow any specific dermatomal path and seem to be intermittent.  He is awake alert and oriented x3 no depression anxiety or agitation affect is flat coordination tests are normal  Skin envelope over the left knee is normal without rash or erythema.  The patient walks with an antalgic gait primarily because of his failed right total knee.  Inspection of his left knee reveals tenderness to palpation over the medial joint line there is questionable joint effusion he does have decreased extension with a 10 degree deficit his flexion is only approximately 105 degrees stability tests are equivocal because of pain and guarding muscle tone is normal  Provocative tests again were equivocal  Assessment/Plan Plain films and MRI revealed   MRI report left knee IMPRESSION: 1. Complex degenerative tear of the body/posterior horn of the medial meniscus.   2. Irregularity and edema about the femoral attachment of the ACL concerning for near complete or high-grade partial-thickness tear.   3.  Mild tricompartmental osteoarthritis.   4.  Large suprapatellar joint effusion.   5.  Mild patellar tendinopathy.   6.  No evidence of fracture or osteonecrosis.     Electronically Signed   By: Victor [name] D.O.  3 views plain films left knee Left knee pain with popping and catching   Alignment is normal in terms of varus valgus there is mild narrowing medial  compartment grade 1 disease probable effusion   Normal patellofemoral alignment    Arther Abbott, MD 04/19/2022, 8:52 AM

## 2022-04-23 ENCOUNTER — Ambulatory Visit (HOSPITAL_COMMUNITY): Payer: Medicare Other

## 2022-04-23 ENCOUNTER — Other Ambulatory Visit: Payer: Self-pay

## 2022-04-23 ENCOUNTER — Encounter (HOSPITAL_COMMUNITY): Payer: Self-pay | Admitting: Orthopedic Surgery

## 2022-04-23 ENCOUNTER — Ambulatory Visit (HOSPITAL_BASED_OUTPATIENT_CLINIC_OR_DEPARTMENT_OTHER): Payer: Medicare Other

## 2022-04-23 ENCOUNTER — Ambulatory Visit (HOSPITAL_COMMUNITY)
Admission: RE | Admit: 2022-04-23 | Discharge: 2022-04-23 | Disposition: A | Discharge status: Home / Self Care | Payer: Medicare Other | Attending: Orthopedic Surgery | Admitting: Orthopedic Surgery

## 2022-04-23 ENCOUNTER — Encounter (HOSPITAL_COMMUNITY)
Admission: RE | Disposition: A | Discharge status: Home / Self Care | Payer: Self-pay | Source: Home / Self Care | Attending: Orthopedic Surgery

## 2022-04-23 DIAGNOSIS — M94262 Chondromalacia, left knee: Secondary | ICD-10-CM | POA: Diagnosis not present

## 2022-04-23 DIAGNOSIS — S83242A Other tear of medial meniscus, current injury, left knee, initial encounter: Secondary | ICD-10-CM

## 2022-04-23 DIAGNOSIS — X58XXXA Exposure to other specified factors, initial encounter: Secondary | ICD-10-CM | POA: Diagnosis not present

## 2022-04-23 DIAGNOSIS — I10 Essential (primary) hypertension: Secondary | ICD-10-CM | POA: Insufficient documentation

## 2022-04-23 DIAGNOSIS — S83512D Sprain of anterior cruciate ligament of left knee, subsequent encounter: Secondary | ICD-10-CM

## 2022-04-23 DIAGNOSIS — S83512A Sprain of anterior cruciate ligament of left knee, initial encounter: Secondary | ICD-10-CM | POA: Insufficient documentation

## 2022-04-23 DIAGNOSIS — S83242D Other tear of medial meniscus, current injury, left knee, subsequent encounter: Secondary | ICD-10-CM | POA: Diagnosis not present

## 2022-04-23 DIAGNOSIS — F17200 Nicotine dependence, unspecified, uncomplicated: Secondary | ICD-10-CM | POA: Insufficient documentation

## 2022-04-23 DIAGNOSIS — S83207D Unspecified tear of unspecified meniscus, current injury, left knee, subsequent encounter: Secondary | ICD-10-CM

## 2022-04-23 DIAGNOSIS — K219 Gastro-esophageal reflux disease without esophagitis: Secondary | ICD-10-CM | POA: Diagnosis not present

## 2022-04-23 DIAGNOSIS — M170 Bilateral primary osteoarthritis of knee: Secondary | ICD-10-CM | POA: Insufficient documentation

## 2022-04-23 HISTORY — PX: KNEE ARTHROSCOPY WITH MEDIAL MENISECTOMY: SHX5651

## 2022-04-23 SURGERY — ARTHROSCOPY, KNEE, WITH MEDIAL MENISCECTOMY
Anesthesia: General | Site: Knee | Laterality: Left

## 2022-04-23 MED ORDER — FENTANYL CITRATE PF 50 MCG/ML IJ SOSY
25.0000 ug | PREFILLED_SYRINGE | INTRAMUSCULAR | Status: DC | PRN
  Administered 2022-04-23: 50 ug via INTRAVENOUS

## 2022-04-23 MED ORDER — OXYCODONE HCL 5 MG PO TABS: ORAL_TABLET | ORAL | 0 refills | Status: DC

## 2022-04-23 MED ORDER — FENTANYL CITRATE (PF) 100 MCG/2ML IJ SOLN
INTRAMUSCULAR | Status: AC
  Filled 2022-04-23: qty 2

## 2022-04-23 MED ORDER — CEFAZOLIN SODIUM-DEXTROSE 2-4 GM/100ML-% IV SOLN
INTRAVENOUS | Status: AC
  Filled 2022-04-23: qty 100

## 2022-04-23 MED ORDER — DEXAMETHASONE SODIUM PHOSPHATE 10 MG/ML IJ SOLN
INTRAMUSCULAR | Status: DC | PRN
  Administered 2022-04-23: 10 mg via INTRAVENOUS

## 2022-04-23 MED ORDER — LIDOCAINE HCL (CARDIAC) PF 100 MG/5ML IV SOSY
PREFILLED_SYRINGE | INTRAVENOUS | Status: DC | PRN
  Administered 2022-04-23: 60 mg via INTRAVENOUS

## 2022-04-23 MED ORDER — BUPIVACAINE-EPINEPHRINE (PF) 0.5% -1:200000 IJ SOLN
INTRAMUSCULAR | Status: AC
  Filled 2022-04-23: qty 60

## 2022-04-23 MED ORDER — CHLORHEXIDINE GLUCONATE 0.12 % MT SOLN: 15.0000 mL | Freq: Once | OROMUCOSAL | Status: DC

## 2022-04-23 MED ORDER — PHENYLEPHRINE HCL (PRESSORS) 10 MG/ML IV SOLN
INTRAVENOUS | Status: DC | PRN
  Administered 2022-04-23: 80 ug via INTRAVENOUS

## 2022-04-23 MED ORDER — LACTATED RINGERS IV SOLN: INTRAVENOUS | Status: DC

## 2022-04-23 MED ORDER — BUPIVACAINE-EPINEPHRINE (PF) 0.5% -1:200000 IJ SOLN
INTRAMUSCULAR | Status: DC | PRN
  Administered 2022-04-23: 60 mL

## 2022-04-23 MED ORDER — ONDANSETRON HCL 4 MG/2ML IJ SOLN
INTRAMUSCULAR | Status: DC | PRN
  Administered 2022-04-23: 4 mg via INTRAVENOUS

## 2022-04-23 MED ORDER — FENTANYL CITRATE (PF) 100 MCG/2ML IJ SOLN
INTRAMUSCULAR | Status: DC | PRN
  Administered 2022-04-23 (×2): 25 ug via INTRAVENOUS
  Administered 2022-04-23: 50 ug via INTRAVENOUS

## 2022-04-23 MED ORDER — SODIUM CHLORIDE 0.9 % IR SOLN
Status: DC | PRN
  Administered 2022-04-23 (×3): 3000 mL

## 2022-04-23 MED ORDER — OXYCODONE HCL 5 MG/5ML PO SOLN: 5.0000 mg | Freq: Once | ORAL | Status: DC | PRN

## 2022-04-23 MED ORDER — MIDAZOLAM HCL 2 MG/2ML IJ SOLN
INTRAMUSCULAR | Status: AC
  Filled 2022-04-23: qty 2

## 2022-04-23 MED ORDER — ORAL CARE MOUTH RINSE: 15.0000 mL | Freq: Once | OROMUCOSAL | Status: DC

## 2022-04-23 MED ORDER — PROPOFOL 10 MG/ML IV BOLUS
INTRAVENOUS | Status: AC
  Filled 2022-04-23: qty 20

## 2022-04-23 MED ORDER — OXYCODONE HCL 5 MG PO TABS
10.0000 mg | ORAL_TABLET | Freq: Once | ORAL | Status: AC
  Administered 2022-04-23: 10 mg via ORAL
  Filled 2022-04-23: qty 2

## 2022-04-23 MED ORDER — FENTANYL CITRATE PF 50 MCG/ML IJ SOSY
PREFILLED_SYRINGE | INTRAMUSCULAR | Status: AC
  Filled 2022-04-23: qty 1

## 2022-04-23 MED ORDER — CEFAZOLIN SODIUM-DEXTROSE 2-4 GM/100ML-% IV SOLN
2.0000 g | INTRAVENOUS | Status: AC
  Administered 2022-04-23: 2 g via INTRAVENOUS

## 2022-04-23 MED ORDER — ONDANSETRON HCL 4 MG/2ML IJ SOLN
4.0000 mg | Freq: Once | INTRAMUSCULAR | Status: DC | PRN
Start: 2022-04-23 — End: 2022-04-23

## 2022-04-23 MED ORDER — ONDANSETRON HCL 4 MG/2ML IJ SOLN
INTRAMUSCULAR | Status: AC
  Filled 2022-04-23: qty 2

## 2022-04-23 MED ORDER — LACTATED RINGERS IV SOLN: INTRAVENOUS | Status: DC | PRN

## 2022-04-23 MED ORDER — OXYCODONE HCL 5 MG PO TABS: 5.0000 mg | ORAL_TABLET | Freq: Once | ORAL | Status: DC | PRN

## 2022-04-23 MED ORDER — DEXAMETHASONE SODIUM PHOSPHATE 10 MG/ML IJ SOLN
INTRAMUSCULAR | Status: AC
  Filled 2022-04-23: qty 1

## 2022-04-23 MED ORDER — PROPOFOL 10 MG/ML IV BOLUS
INTRAVENOUS | Status: DC | PRN
  Administered 2022-04-23: 250 mg via INTRAVENOUS

## 2022-04-23 MED ORDER — EPINEPHRINE PF 1 MG/ML IJ SOLN
INTRAMUSCULAR | Status: AC
  Filled 2022-04-23: qty 5

## 2022-04-23 MED ORDER — DEXAMETHASONE SODIUM PHOSPHATE 10 MG/ML IJ SOLN
10.0000 mg | Freq: Once | INTRAMUSCULAR | Status: AC
Start: 2022-04-23 — End: 2022-04-23
  Administered 2022-04-23: 10 mg via INTRAVENOUS
  Filled 2022-04-23: qty 1

## 2022-04-23 MED ORDER — MIDAZOLAM HCL 5 MG/5ML IJ SOLN
INTRAMUSCULAR | Status: DC | PRN
  Administered 2022-04-23: 2 mg via INTRAVENOUS

## 2022-04-23 SURGICAL SUPPLY — 44 items
ABLATOR ASPIRATE 50D MULTI-PRT (SURGICAL WAND) IMPLANT
APL PRP STRL LF DISP 70% ISPRP (MISCELLANEOUS) ×1
BAG HAMPER (MISCELLANEOUS) ×1 IMPLANT
BNDG CMPR STD VLCR NS LF 5.8X6 (GAUZE/BANDAGES/DRESSINGS) ×1
BNDG ELASTIC 6X5.8 VLCR NS LF (GAUZE/BANDAGES/DRESSINGS) ×1 IMPLANT
CHLORAPREP W/TINT 26 (MISCELLANEOUS) ×1 IMPLANT
CLOTH BEACON ORANGE TIMEOUT ST (SAFETY) ×1 IMPLANT
COOLER ICEMAN CLASSIC (MISCELLANEOUS) ×1 IMPLANT
CUFF TOURN SGL QUICK 34 (TOURNIQUET CUFF) ×1
CUFF TRNQT CYL 34X4.125X (TOURNIQUET CUFF) IMPLANT
DECANTER SPIKE VIAL GLASS SM (MISCELLANEOUS) ×2 IMPLANT
DRAPE HALF SHEET 40X57 (DRAPES) ×1 IMPLANT
GAUZE SPONGE 4X4 12PLY STRL (GAUZE/BANDAGES/DRESSINGS) ×1 IMPLANT
GAUZE XEROFORM 5X9 LF (GAUZE/BANDAGES/DRESSINGS) ×1 IMPLANT
GLOVE BIOGEL PI IND STRL 7.0 (GLOVE) ×2 IMPLANT
GLOVE BIOGEL PI INDICATOR 7.0 (GLOVE) ×3
GLOVE SS N UNI LF 8.5 STRL (GLOVE) ×1 IMPLANT
GLOVE SURG POLYISO LF SZ8 (GLOVE) ×1 IMPLANT
GOWN STRL REUS W/TWL LRG LVL3 (GOWN DISPOSABLE) ×1 IMPLANT
GOWN STRL REUS W/TWL XL LVL3 (GOWN DISPOSABLE) ×1 IMPLANT
IV NS IRRIG 3000ML ARTHROMATIC (IV SOLUTION) ×2 IMPLANT
KIT BLADEGUARD II DBL (SET/KITS/TRAYS/PACK) ×1 IMPLANT
KIT TURNOVER CYSTO (KITS) ×1 IMPLANT
MANIFOLD NEPTUNE II (INSTRUMENTS) ×1 IMPLANT
MARKER SKIN DUAL TIP RULER LAB (MISCELLANEOUS) ×1 IMPLANT
NDL HYPO 18GX1.5 BLUNT FILL (NEEDLE) ×1 IMPLANT
NDL HYPO 21X1.5 SAFETY (NEEDLE) ×1 IMPLANT
NEEDLE HYPO 18GX1.5 BLUNT FILL (NEEDLE) ×1 IMPLANT
NEEDLE HYPO 21X1.5 SAFETY (NEEDLE) ×1 IMPLANT
NS IRRIG 1000ML POUR BTL (IV SOLUTION) ×1 IMPLANT
PACK ARTHRO LIMB DRAPE STRL (MISCELLANEOUS) ×1 IMPLANT
PACK ARTHROSCOPY WL (CUSTOM PROCEDURE TRAY) ×1 IMPLANT
PAD ABD 5X9 TENDERSORB (GAUZE/BANDAGES/DRESSINGS) ×1 IMPLANT
PAD ARMBOARD 7.5X6 YLW CONV (MISCELLANEOUS) ×1 IMPLANT
PAD COLD SHLDR SM WRAP-ON (PAD) IMPLANT
PAD FOR LEG HOLDER (MISCELLANEOUS) ×1 IMPLANT
PADDING CAST COTTON 6X4 STRL (CAST SUPPLIES) ×1 IMPLANT
RESECTOR TORPEDO 4MM 13CM CVD (MISCELLANEOUS) IMPLANT
SET ARTHROSCOPY INST (INSTRUMENTS) ×1 IMPLANT
SET BASIN LINEN APH (SET/KITS/TRAYS/PACK) ×1 IMPLANT
SUT ETHILON 3 0 FSL (SUTURE) ×1 IMPLANT
SYR 10ML LL (SYRINGE) ×1 IMPLANT
SYR 30ML LL (SYRINGE) ×2 IMPLANT
TUBING IN/OUT FLOW W/MAIN PUMP (TUBING) ×1 IMPLANT

## 2022-04-23 NOTE — Transfer of Care (Signed)
Immediate Anesthesia Transfer of Care Note  Patient: Victor [name]  Procedure(s) Performed: KNEE ARTHROSCOPY WITH MEDIAL MENISCECTOMY (Left: Knee)  Patient Location: PACU  Anesthesia Type:General  Level of Consciousness: awake, alert  and oriented  Airway & Oxygen Therapy: Patient Spontanous Breathing  Post-op Assessment: Report given to RN and Post -op Vital signs reviewed and stable  Post vital signs: Reviewed and stable  Last Vitals:  Vitals Value Taken Time  BP 135/82 04/23/22 1146  Temp    Pulse 81 04/23/22 1147  Resp 15 04/23/22 1147  SpO2 92 % 04/23/22 1147  Vitals shown include unvalidated device data.  Last Pain:  Vitals:   04/23/22 0910  PainSc: 2          Complications: No notable events documented.

## 2022-04-23 NOTE — Brief Op Note (Signed)
04/23/2022  11:39 AM  PATIENT:  Victor [name]  45 y.o. 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  There were multiple loose fragments of cartilage in the knee.  The medial meniscal tear was in the region of the body and posterior horn junction.  There was a large chondral lesion in the trochlea which was full-thickness.  There was mild chondromalacia on the lateral tibial plateau  The surgery was done as follows  The patient was taken to the operating room for general anesthesia which was followed by sterile prep and drape of the left lower extremity.  We used an arthroscopic leg holder and the right leg was placed in a padded well leg holder  After timeout we made a standard lateral portal and the scope was placed into the joint.  The diagnostic arthroscopy was performed during the entire knee joint.  A spinal needle was used to make a medial portal.  We placed a shaver into the joint a curved torpedo and removed loose cartilage fragments that were in the joint.  We did several maneuvers to ensure that all the loose particles were removed.  We then used the shaver to perform a partial medial meniscectomy.  We then took a probe confirmed a stable rim.  We lifted up the posterior horn to confirm that all of the loose fragments of cartilage were removed and there was no other tear  We then found part of the anterior medial bundle torn with the remaining portion of the ACL  scarred to the PCL with empty lateral wall sign as well.  The trochlea had a large trochlear defect grade 4  The lateral plateau had fissures in the cartilage.  The joint was irrigated suctioned dry and then the portals were closed with 3-0 nylon sutures with interrupted fashion.  We injected a total of 60 cc of Marcaine into the joint post procedure  We placed a sterile dressing and Ace bandage and a Cryo/Cuff which was activated  The patient was extubated and taken recovery room in stable condition  Postoperative plan will be for a full weightbearing as tolerated with active range of motion exercises quadricep strengthening and a follow-up within a week   SURGEON:  Surgeon(s) and Role:    * [name], MD - Primary  PHYSICIAN ASSISTANT:   ASSISTANTS: none   ANESTHESIA:   general  EBL:  min   BLOOD ADMINISTERED:none  DRAINS: none   LOCAL MEDICATIONS USED:  MARCAINE     SPECIMEN:  No Specimen  DISPOSITION OF SPECIMEN:  N/A  COUNTS:  YES  TOURNIQUET:  * Missing tourniquet times found for documented tourniquets in log: [id number] *  DICTATION: .Dragon Dictation  PLAN OF CARE: Discharge to home after PACU  PATIENT DISPOSITION:  PACU - hemodynamically stable.   Delay start of Pharmacological VTE agent (>24hrs) due to surgical blood loss or risk of bleeding: not applicable

## 2022-04-23 NOTE — Anesthesia Preprocedure Evaluation (Addendum)
Anesthesia Evaluation  Patient identified by MRN, date of birth, ID band Patient awake    Reviewed: Allergy & Precautions, H&P , NPO status , Patient's Chart, lab work & pertinent test results, reviewed documented beta blocker date and time   Airway Mallampati: II  TM Distance: >3 FB Neck ROM: full    Dental no notable dental hx.    Pulmonary neg pulmonary ROS, Current Smoker,    Pulmonary exam normal breath sounds clear to auscultation       Cardiovascular Exercise Tolerance: Good hypertension, negative cardio ROS   Rhythm:regular Rate:Normal     Neuro/Psych negative neurological ROS  negative psych ROS   GI/Hepatic negative GI ROS, Neg liver ROS, GERD  Medicated,  Endo/Other  negative endocrine ROS  Renal/GU negative Renal ROS  negative genitourinary   Musculoskeletal   Abdominal   Peds  Hematology negative hematology ROS (+)   Anesthesia Other Findings   Reproductive/Obstetrics negative OB ROS                            Anesthesia Physical Anesthesia Plan  ASA: 2  Anesthesia Plan: General LMA   Post-op Pain Management:    Induction:   PONV Risk Score and Plan: Ondansetron  Airway Management Planned:   Additional Equipment:   Intra-op Plan:   Post-operative Plan:   Informed Consent: I have reviewed the patients History and Physical, chart, labs and discussed the procedure including the risks, benefits and alternatives for the proposed anesthesia with the patient or authorized representative who has indicated his/her understanding and acceptance.     Dental Advisory Given  Plan Discussed with: CRNA  Anesthesia Plan Comments:        Anesthesia Quick Evaluation

## 2022-04-23 NOTE — Interval H&P Note (Signed)
History and Physical Interval Note:  04/23/2022 10:10 AM  Victor [name]  has presented today for surgery, with the diagnosis of left knee arthroscopy medial meniscectomy.  The various methods of treatment have been discussed with the patient and family. After consideration of risks, benefits and other options for treatment, the patient has consented to  Procedure(s): KNEE ARTHROSCOPY WITH MEDIAL MENISCECTOMY (Left) and EUA as a surgical intervention.  The patient's history has been reviewed, patient examined, no change in status, stable for surgery.  I have reviewed the patient's chart and labs.  Questions were answered to the patient's satisfaction.     Arther Abbott

## 2022-04-23 NOTE — Anesthesia Procedure Notes (Signed)
Procedure Name: LMA Insertion Date/Time: 04/23/2022 10:33 AM  Performed by: Karna Dupes, CRNAPre-anesthesia Checklist: Emergency Drugs available, Patient identified, Suction available and Patient being monitored Patient Re-evaluated:Patient Re-evaluated prior to induction Oxygen Delivery Method: Circle system utilized Preoxygenation: Pre-oxygenation with 100% oxygen Induction Type: IV induction LMA: LMA inserted LMA Size: 5.0 Number of attempts: 1 Placement Confirmation: positive ETCO2 and breath sounds checked- equal and bilateral Tube secured with: Tape Dental Injury: Teeth and Oropharynx as per pre-operative assessment

## 2022-04-23 NOTE — Op Note (Signed)
04/23/2022  11:39 AM  PATIENT:  Victor [name]  46 y.o. 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  There were multiple loose fragments of cartilage in the knee.  The medial meniscal tear was in the region of the body and posterior horn junction.  There was a large chondral lesion in the trochlea which was full-thickness.  There was mild chondromalacia on the lateral tibial plateau  The surgery was done as follows  The patient was taken to the operating room for general anesthesia which was followed by sterile prep and drape of the left lower extremity.  We used an arthroscopic leg holder and the right leg was placed in a padded well leg holder  After timeout we made a standard lateral portal and the scope was placed into the joint.  The diagnostic arthroscopy was performed during the entire knee joint.  A spinal needle was used to make a medial portal.  We placed a shaver into the joint a curved torpedo and removed loose cartilage fragments that were in the joint.  We did several maneuvers to ensure that all the loose particles were removed.  We then used the shaver to perform a partial medial meniscectomy.  We then took a probe confirmed a stable rim.  We lifted up the posterior horn to confirm that all of the loose fragments of cartilage were removed and there was no other tear  We then found part of the anterior medial bundle torn with the remaining portion of the ACL  scarred to the PCL with empty lateral wall sign as well.  The trochlea had a large trochlear defect grade 4  The lateral plateau had fissures in the cartilage.  The joint was irrigated suctioned dry and then the portals were closed with 3-0 nylon sutures with interrupted fashion.  We injected a total of 60 cc of Marcaine into the joint post procedure  We placed a sterile dressing and Ace bandage and a Cryo/Cuff which was activated  The patient was extubated and taken recovery room in stable condition  Postoperative plan will be for a full weightbearing as tolerated with active range of motion exercises quadricep strengthening and a follow-up within a week   SURGEON:  Surgeon(s) and Role:    * [name], MD - Primary  PHYSICIAN ASSISTANT:   ASSISTANTS: none   ANESTHESIA:   general  EBL:  min   BLOOD ADMINISTERED:none  DRAINS: none   LOCAL MEDICATIONS USED:  MARCAINE     SPECIMEN:  No Specimen  DISPOSITION OF SPECIMEN:  N/A  COUNTS:  YES  TOURNIQUET:  * Missing tourniquet times found for documented tourniquets in log: [id number] *  DICTATION: .Dragon Dictation  PLAN OF CARE: Discharge to home after PACU  PATIENT DISPOSITION:  PACU - hemodynamically stable.   Delay start of Pharmacological VTE agent (>24hrs) due to surgical blood loss or risk of bleeding: not applicable

## 2022-04-24 ENCOUNTER — Encounter: Payer: Self-pay | Admitting: Orthopedic Surgery

## 2022-04-25 NOTE — Anesthesia Postprocedure Evaluation (Signed)
Anesthesia Post Note  Patient: Victor [name]  Procedure(s) Performed: KNEE ARTHROSCOPY WITH MEDIAL MENISCECTOMY (Left: Knee) EXAM UNDER ANESTHESIA (Left: Knee)  Patient location during evaluation: Phase II Anesthesia Type: General Level of consciousness: awake Pain management: pain level controlled Vital Signs Assessment: post-procedure vital signs reviewed and stable Respiratory status: spontaneous breathing and respiratory function stable Cardiovascular status: blood pressure returned to baseline and stable Postop Assessment: no headache and no apparent nausea or vomiting Anesthetic complications: no Comments: Late entry   No notable events documented.   Last Vitals:  Vitals:   04/23/22 1245 04/23/22 1251  BP: 135/78 132/74  Pulse:  76  Resp: 14 15  Temp:  36.7 C  SpO2: 91% 93%    Last Pain:  Vitals:                 Louann Sjogren

## 2022-04-30 ENCOUNTER — Encounter (HOSPITAL_COMMUNITY): Payer: Self-pay | Admitting: Orthopedic Surgery

## 2022-05-08 ENCOUNTER — Ambulatory Visit (INDEPENDENT_AMBULATORY_CARE_PROVIDER_SITE_OTHER): Payer: Medicare Other | Admitting: Orthopedic Surgery

## 2022-05-08 DIAGNOSIS — Z9889 Other specified postprocedural states: Secondary | ICD-10-CM

## 2022-05-08 NOTE — Progress Notes (Signed)
FOLLOW UP   Encounter Diagnosis  Name Primary?   S/P arthroscopy of left knee Yes     Chief Complaint  Patient presents with   Knee Pain    L/ doing good.   PROCEDURE:  Procedure(s): Exam under anesthesia and KNEE ARTHROSCOPY WITH MEDIAL MENISCECTOMY (Left)   Findings   Under anesthesia the patient had a trace positive firm endpoint Lachman test a pivot glide and then intraoperatively   He had a partial ACL tear with scarring to the PCL looked like the anterior medial bundle was torn.  There were multiple loose fragments of cartilage in the knee.  The medial meniscal tear was in the region of the body and posterior horn junction.  There was a large chondral lesion in the trochlea which was full-thickness.  There was mild chondromalacia on the lateral tibial plateau  Emerick is doing great with his left knee he has full range of motion full extension no effusion he knows how to do exercises from all his episodes with his right knee  That is still bothering him  He is considering hinged knee replacement at [facility] he will see the doctor in November  We took his sutures out.  He will continue home exercises with his left knee and follow-up as needed

## 2022-06-04 ENCOUNTER — Encounter: Payer: Self-pay | Admitting: Family Medicine

## 2022-06-04 ENCOUNTER — Ambulatory Visit (INDEPENDENT_AMBULATORY_CARE_PROVIDER_SITE_OTHER): Payer: Medicare Other

## 2022-06-04 ENCOUNTER — Ambulatory Visit (INDEPENDENT_AMBULATORY_CARE_PROVIDER_SITE_OTHER): Payer: Medicare Other | Admitting: Family Medicine

## 2022-06-04 ENCOUNTER — Ambulatory Visit (HOSPITAL_COMMUNITY)
Admission: RE | Admit: 2022-06-04 | Discharge: 2022-06-04 | Disposition: A | Discharge status: Home / Self Care | Payer: Medicare Other | Source: Ambulatory Visit | Attending: Family Medicine | Admitting: Family Medicine

## 2022-06-04 VITALS — BP 135/82 | HR 78 | Temp 98.4°F | Ht 71.0 in | Wt 249.6 lb

## 2022-06-04 DIAGNOSIS — R0781 Pleurodynia: Secondary | ICD-10-CM | POA: Insufficient documentation

## 2022-06-04 DIAGNOSIS — R071 Chest pain on breathing: Secondary | ICD-10-CM | POA: Insufficient documentation

## 2022-06-04 MED ORDER — IOHEXOL 350 MG/ML SOLN
80.0000 mL | Freq: Once | INTRAVENOUS | Status: AC | PRN
  Administered 2022-06-04: 80 mL via INTRAVENOUS

## 2022-06-04 NOTE — Progress Notes (Signed)
Subjective: CC: Pleuritic chest pain PCP: [name], DO Victor [name] is a 46 y.o. male presenting to clinic today for:  1.  Pleuritic chest patient reports that he is having pleuritic chest pain months.  Feels like he is "being kicked in the ribs".  He notes that it is hard to breathe move or stand.  Reports pain that extends from the left posterior rib across the entire chest.  No hemoptysis.  No fevers.  Has been feeling clammy for the last couple of days.  Pain is constant and really only relieved for about 1 hour after he takes his pain medicine for his knee.  Symptoms are gradually getting worse.  He reports that no urinary symptoms.  No history of renal stones.  Denies any nausea, vomiting, abdominal pain, blood in stool.  Bowel movements are normal.  Active smoker.  Last time he had symptoms that were similar to this he had a left-sided empyema which required thoracic surgery to drain.  This was winter of 2022.   ROS: Per HPI  No Known Allergies Past Medical History:  Diagnosis Date   GERD (gastroesophageal reflux disease)    History of stomach ulcers    Hypertension     Current Outpatient Medications:    amLODipine (NORVASC) 10 MG tablet, Take 1 tablet (10 mg total) by mouth daily., Disp: 90 tablet, Rfl: 3   aspirin EC 325 MG tablet, Take 325 mg by mouth daily., Disp: , Rfl:    Cholecalciferol (VITAMIN D) 125 MCG (5000 UT) CAPS, Take 5,000 Units by mouth daily., Disp: , Rfl:    docusate sodium (COLACE) 100 MG capsule, Take 100 mg by mouth daily., Disp: , Rfl:    methocarbamol (ROBAXIN) 500 MG tablet, Take 1 tablet (500 mg total) by mouth every 6 (six) hours as needed for muscle spasms., Disp: 30 tablet, Rfl: 0   naloxone (NARCAN) nasal spray 4 mg/0.1 mL, Place 0.4 mg into the nose once., Disp: , Rfl:    oxyCODONE (OXY IR/ROXICODONE) 5 MG immediate release tablet, 2 q 4 prn pain x 3 days post op pain, had surgery, Disp: 36 tablet, Rfl: 0   oxyCODONE-acetaminophen  (PERCOCET) 7.5-325 MG tablet, Take 1 tablet by mouth every 8 (eight) hours as needed for severe pain., Disp: , Rfl:    pregabalin (LYRICA) 100 MG capsule, Take 100 mg by mouth 3 (three) times daily as needed (pain)., Disp: , Rfl:    traZODone (DESYREL) 50 MG tablet, Take 50 mg by mouth at bedtime as needed for sleep., Disp: , Rfl:  Social History   Socioeconomic History   Marital status: Married    Spouse name: Not on file   Number of children: 3   Years of education: Not on file   Highest education level: Not on file  Occupational History   Not on file  Tobacco Use   Smoking status: Every Day    Packs/day: 0.50    Years: 15.00    Total pack years: 7.50    Types: Cigarettes    Start date: 07/07/1991    Last attempt to quit: 04/09/2018    Years since quitting: 4.1   Smokeless tobacco: Former    Types: Chew    Quit date: 06/09/2018  Vaping Use   Vaping Use: Never used  Substance and Sexual Activity   Alcohol use: No   Drug use: No   Sexual activity: Yes  Other Topics Concern   Not on file  Social History  Narrative   Not on file   Social Determinants of Health   Financial Resource Strain: Not on file  Food Insecurity: Not on file  Transportation Needs: Not on file  Physical Activity: Not on file  Stress: Not on file  Social Connections: Not on file  Intimate Partner Violence: Not on file   Family History  Problem Relation Age of Onset   Diabetes Mother    Heart failure Mother    Cancer Mother    Pancreatic cancer Mother    Cancer Father    Throat cancer Father    Healthy Sister    Head & neck cancer Brother    Bone cancer Maternal Grandmother    Diabetes Maternal Grandmother    CAD Maternal Grandfather    Brain cancer Maternal Grandfather    Colon cancer Paternal Grandfather     Objective: Office vital signs reviewed. BP 135/82   Pulse 78   Temp 98.4 F (36.9 C)   Ht '5\' 11"'$  (1.803 m)   Wt 249 lb 9.6 oz (113.2 kg)   SpO2 98%   BMI 34.81 kg/m    Physical Examination:  General: Awake, alert, nontoxic but appears uncomfortable HEENT: sclera white, MMM Cardio: regular rate and rhythm, S1S2 heard, no murmurs appreciated Pulm: reduced air movement on left. no wheezes, rhonchi  splinting with deep breathing. Mild rales on left side appreciated.  No results found.  Assessment/ Plan: 46 y.o. male   Pleuritic chest pain - Plan: DG Chest 2 View, CT Angio Chest W/Cm &/Or Wo Cm, CANCELED: CT Chest Wo Contrast  Chest pain on breathing - Plan: CT Angio Chest W/Cm &/Or Wo Cm  Uncertain etiology of this pleuritic chest pain but he certainly tender throughout the entire chest and has visible splinting while taking deep breaths in.  Personal review of chest x-ray demonstrated no acute findings that would explain symptoms.  He does not have any urinary symptoms to suggest infection or stone.  CTA ordered to further evaluate.  Further work-up pending this result  Orders Placed This Encounter  Procedures   DG Chest 2 View    Order Specific Question:   Reason for Exam (SYMPTOM  OR DIAGNOSIS REQUIRED)    Answer:   left rib pain    Order Specific Question:   Preferred imaging location?    Answer:   Internal   CT Angio Chest W/Cm &/Or Wo Cm    Standing Status:   Future    Standing Expiration Date:   06/05/2023    Order Specific Question:   If indicated for the ordered procedure, I authorize the administration of contrast media per Radiology protocol    Answer:   Yes    Order Specific Question:   Preferred imaging location?    Answer:   [facility]   No orders of the defined types were placed in this encounter.    [name], DO [place] [phone]

## 2022-07-01 ENCOUNTER — Encounter: Payer: Self-pay | Admitting: Family Medicine

## 2022-07-01 ENCOUNTER — Encounter: Payer: Medicaid Other | Admitting: Family Medicine

## 2022-07-01 NOTE — Patient Instructions (Incomplete)
You had labs performed today.  You will be contacted with the results of the labs once they are available, usually in the next 3 business days for routine lab work.  If you have an active my chart account, they will be released to your MyChart.  If you prefer to have these labs released to you via telephone, please let us know.   Preventive Care 40-48 Years Old, Male Preventive care refers to lifestyle choices and visits with your health care provider that can promote health and wellness. Preventive care visits are also called wellness exams. What can I expect for my preventive care visit? Counseling During your preventive care visit, your health care provider [place] ask about your: Medical history, including: Past medical problems. Family medical history. Current health, including: Emotional well-being. Home life and relationship well-being. Sexual activity. Lifestyle, including: Alcohol, nicotine or tobacco, and drug use. Access to firearms. Diet, exercise, and sleep habits. Safety issues such as seatbelt and bike helmet use. Sunscreen use. Work and work environment. Physical exam Your health care provider will check your: Height and weight. These [place]son be used to calculate your BMI (body mass index). BMI is a measurement that tells if you are at a healthy weight. Waist circumference. This measures the distance around your waistline. This measurement also tells if you are at a healthy weight and [place] help predict your risk of certain diseases, such as type 2 diabetes and high blood pressure. Heart rate and blood pressure. Body temperature. Skin for abnormal spots. What immunizations do I need?  Vaccines are usually given at various ages, according to a schedule. Your health care provider will recommend vaccines for you based on your age, medical history, and lifestyle or other factors, such as travel or where you work. What tests do I need? Screening Your health care provider [place]  recommend screening tests for certain conditions. This [place] include: Lipid and cholesterol levels. Diabetes screening. This is done by checking your blood sugar (glucose) after you have not eaten for a while (fasting). Hepatitis B test. Hepatitis C test. HIV (human immunodeficiency virus) test. STI (sexually transmitted infection) testing, if you are at risk. Lung cancer screening. Prostate cancer screening. Colorectal cancer screening. Talk with your health care provider about your test results, treatment options, and if necessary, the need for more tests. Follow these instructions at home: Eating and drinking  Eat a diet that includes fresh fruits and vegetables, whole grains, lean protein, and low-fat dairy products. Take vitamin and mineral supplements as recommended by your health care provider. Do not drink alcohol if your health care provider tells you not to drink. If you drink alcohol: Limit how much you have to 0-2 drinks a day. Know how much alcohol is in your drink. In the [place] one drink equals one 12 oz bottle of beer (355 mL), one 5 oz glass of wine (148 mL), or one 1 oz glass of hard liquor (44 mL). Lifestyle Brush your teeth every morning and night with fluoride toothpaste. Floss one time each day. Exercise for at least 30 minutes 5 or more days each week. Do not use any products that contain nicotine or tobacco. These products include cigarettes, chewing tobacco, and vaping devices, such as e-cigarettes. If you need help quitting, ask your health care provider. Do not use drugs. If you are sexually active, practice safe sex. Use a condom or other form of protection to prevent STIs. Take aspirin only as told by your health care provider. Make sure   that you understand how much to take and what form to take. Work with your health care provider to find out whether it is safe and beneficial for you to take aspirin daily. Find healthy ways to manage stress, such  as: Meditation, yoga, or listening to music. Journaling. Talking to a trusted person. Spending time with friends and family. Minimize exposure to UV radiation to reduce your risk of skin cancer. Safety Always wear your seat belt while driving or riding in a vehicle. Do not drive: If you have been drinking alcohol. Do not ride with someone who has been drinking. When you are tired or distracted. While texting. If you have been using any mind-altering substances or drugs. Wear a helmet and other protective equipment during sports activities. If you have firearms in your house, make sure you follow all gun safety procedures. What's next? Go to your health care provider once a year for an annual wellness visit. Ask your health care provider how often you should have your eyes and teeth checked. Stay up to date on all vaccines. This information is not intended to replace advice given to you by your health care provider. Make sure you discuss any questions you have with your health care provider. Document Revised: 02/07/2021 Document Reviewed: 02/07/2021 Elsevier Patient Education  2023 Elsevier Inc.   

## 2022-07-15 ENCOUNTER — Other Ambulatory Visit: Payer: Self-pay | Admitting: Family Medicine

## 2022-07-15 DIAGNOSIS — I10 Essential (primary) hypertension: Secondary | ICD-10-CM

## 2022-07-15 NOTE — Telephone Encounter (Signed)
Gave 30 day since seen recently for chest pain although last chronic ckup was 1 yr ago

## 2022-07-31 ENCOUNTER — Encounter: Payer: Self-pay | Admitting: Family Medicine

## 2022-07-31 ENCOUNTER — Ambulatory Visit (INDEPENDENT_AMBULATORY_CARE_PROVIDER_SITE_OTHER): Payer: Medicare Other | Admitting: Family Medicine

## 2022-07-31 VITALS — BP 133/76 | HR 73 | Ht 71.0 in | Wt 249.0 lb

## 2022-07-31 DIAGNOSIS — K219 Gastro-esophageal reflux disease without esophagitis: Secondary | ICD-10-CM | POA: Diagnosis not present

## 2022-07-31 DIAGNOSIS — S2232XG Fracture of one rib, left side, subsequent encounter for fracture with delayed healing: Secondary | ICD-10-CM | POA: Diagnosis not present

## 2022-07-31 DIAGNOSIS — K5903 Drug induced constipation: Secondary | ICD-10-CM | POA: Diagnosis not present

## 2022-07-31 NOTE — Progress Notes (Signed)
BP 133/76   Pulse 73   Ht _0  (1.803 m)   Wt 249 lb (112.9 kg)   SpO2 99%   BMI 34.73 kg/m    Subjective:   Patient ID: Victor [name], male    DOB: [birth year], 46 y.o.   MRN: [id number]  HPI: Victor [name] is a 46 y.o. male presenting on 07/31/2022 for Chest Pain (Left sided- radiates to stomach causing tenderness)   HPI Rib pain and abdominal Patient is coming in today for continued rib pain and now developing into abdominal pain.  He says that it hurts on the left lateral aspect of his ribs and does come around to the front and that is been hurting over the past couple months.  He did have a CT that showed rib fracture then.  He says he takes oxycodone and Lyrica and it just does not seem to be improving there.  He denies any cough or congestion or fevers or chills.  He says over the past couple weeks he has developed more constipation than he normally has.  He does normally get constipation with his pain medicines but he feels like it is more and now he has some abdominal pain especially on the left side in the epigastric region and then some belching and burping and gas.  Relevant past medical, surgical, family and social history reviewed and updated as indicated. Interim medical history since our last visit reviewed. Allergies and medications reviewed and updated.  Review of Systems  Constitutional:  Negative for chills and fever.  Eyes:  Negative for visual disturbance.  Respiratory:  Negative for shortness of breath and wheezing.   Cardiovascular:  Negative for chest pain and leg swelling.  Gastrointestinal:  Positive for abdominal distention, abdominal pain and constipation. Negative for diarrhea, nausea and vomiting.  Musculoskeletal:  Positive for arthralgias. Negative for back pain and gait problem.  Skin:  Negative for rash.  Neurological:  Negative for dizziness, weakness and light-headedness.  All other systems reviewed and are negative.   Per HPI unless  specifically indicated above   Allergies as of 07/31/2022   No Known Allergies      Medication List        Accurate as of July 31, 2022  9:32 AM. If you have any questions, ask your nurse or doctor.          STOP taking these medications    oxyCODONE 5 MG immediate release tablet Commonly known as: Oxy IR/ROXICODONE Stopped by: Fransisca Kaufmann Neema Barreira, MD       TAKE these medications    amLODipine 10 MG tablet Commonly known as: NORVASC Take 1 tablet (10 mg total) by mouth daily. (NEEDS TO BE SEEN BEFORE NEXT REFILL)   aspirin EC 325 MG tablet Take 325 mg by mouth daily.   docusate sodium 100 MG capsule Commonly known as: COLACE Take 100 mg by mouth daily.   methocarbamol 500 MG tablet Commonly known as: ROBAXIN Take 1 tablet (500 mg total) by mouth every 6 (six) hours as needed for muscle spasms.   naloxone 4 MG/0.1ML Liqd nasal spray kit Commonly known as: NARCAN Place 0.4 mg into the nose once.   oxyCODONE-acetaminophen 7.5-325 MG tablet Commonly known as: PERCOCET Take 1 tablet by mouth every 8 (eight) hours as needed for severe pain.   pregabalin 100 MG capsule Commonly known as: LYRICA Take 100 mg by mouth 3 (three) times daily as needed (pain).   traZODone 50 MG tablet Commonly  known as: DESYREL Take 50 mg by mouth at bedtime as needed for sleep.   Vitamin D 125 MCG (5000 UT) Caps Take 5,000 Units by mouth daily.         Objective:   BP 133/76   Pulse 73   Ht _0  (1.803 m)   Wt 249 lb (112.9 kg)   SpO2 99%   BMI 34.73 kg/m   Wt Readings from Last 3 Encounters:  07/31/22 249 lb (112.9 kg)  06/04/22 249 lb 9.6 oz (113.2 kg)  04/23/22 242 lb 8.1 oz (110 kg)    Physical Exam Vitals and nursing note reviewed.  Constitutional:      General: He is not in acute distress.    Appearance: He is well-developed. He is not diaphoretic.  Eyes:     General: No scleral icterus.    Conjunctiva/sclera: Conjunctivae normal.  Neck:      Thyroid: No thyromegaly.  Cardiovascular:     Rate and Rhythm: Normal rate and regular rhythm.     Heart sounds: Normal heart sounds. No murmur heard. Pulmonary:     Effort: Pulmonary effort is normal. No respiratory distress.     Breath sounds: Normal breath sounds. No stridor. No wheezing, rhonchi or rales.  Chest:     Chest wall: Tenderness (Left lower rib pain, positive compression test) present.  Abdominal:     General: Abdomen is flat. Bowel sounds are normal. There is no distension.     Tenderness: There is abdominal tenderness in the epigastric area, periumbilical area, left upper quadrant and left lower quadrant. There is no right CVA tenderness, left CVA tenderness, guarding or rebound. Negative signs include Murphy's sign.  Musculoskeletal:        General: No swelling. Normal range of motion.     Cervical back: Neck supple.  Lymphadenopathy:     Cervical: No cervical adenopathy.  Skin:    General: Skin is warm and dry.     Findings: No rash.  Neurological:     Mental Status: He is alert and oriented to person, place, and time.     Coordination: Coordination normal.  Psychiatric:        Behavior: Behavior normal.       Assessment & Plan:   Problem List Items Addressed This Visit   None Visit Diagnoses     Gastroesophageal reflux disease without esophagitis    -  Primary   Drug-induced constipation       Closed fracture of one rib of left side with delayed healing, subsequent encounter         Recommended over-the-counter Pepcid twice daily and recommended MiraLAX once or twice daily until he clears out his bowels to help with abdominal issues.  Recommended that he get with his PCP and get referral for colonoscopies.  For rib fracture recommended smoking cessation and to use compression band to help with the pain.  Follow up plan: Return if symptoms worsen or fail to improve.  Counseling provided for all of the vaccine components No orders of the defined  types were placed in this encounter.   Caryl Pina, MD [facility] 07/31/2022, 9:32 AM

## 2022-08-05 ENCOUNTER — Other Ambulatory Visit: Payer: Self-pay | Admitting: Family Medicine

## 2022-08-05 ENCOUNTER — Encounter: Payer: Self-pay | Admitting: Family Medicine

## 2022-08-05 ENCOUNTER — Telehealth: Payer: Self-pay | Admitting: Family Medicine

## 2022-08-05 DIAGNOSIS — I10 Essential (primary) hypertension: Secondary | ICD-10-CM

## 2022-08-05 NOTE — Telephone Encounter (Signed)
  Prescription Request  08/05/2022  What is the name of the medication or equipment? BP MED  Have you contacted your pharmacy to request a refill? YES  Which pharmacy would you like this sent to? [facility]  Pt scheduled to see PCP on 09/18/22 (first available). Needs refill sent in to last him until appt.

## 2022-08-05 NOTE — Telephone Encounter (Signed)
[facility] TO SCHEDULE APPT FOR CPE LETTER MAILED

## 2022-08-05 NOTE — Telephone Encounter (Signed)
[place] NTBS 30 days given 07/15/22

## 2022-08-06 MED ORDER — AMLODIPINE BESYLATE 10 MG PO TABS: 10.0000 mg | ORAL_TABLET | Freq: Every day | ORAL | 0 refills | Status: DC

## 2022-08-06 NOTE — Telephone Encounter (Signed)
Refills sent

## 2022-08-14 ENCOUNTER — Encounter: Payer: Self-pay | Admitting: Orthopedic Surgery

## 2022-08-16 ENCOUNTER — Encounter: Payer: Medicare Other | Admitting: Orthopedic Surgery

## 2022-08-21 ENCOUNTER — Encounter: Payer: Self-pay | Admitting: Orthopedic Surgery

## 2022-08-21 ENCOUNTER — Ambulatory Visit (INDEPENDENT_AMBULATORY_CARE_PROVIDER_SITE_OTHER): Payer: Medicare Other | Admitting: Orthopedic Surgery

## 2022-08-21 ENCOUNTER — Ambulatory Visit (INDEPENDENT_AMBULATORY_CARE_PROVIDER_SITE_OTHER): Payer: Medicare Other

## 2022-08-21 VITALS — BP 153/87 | HR 82 | Ht 71.0 in | Wt 252.8 lb

## 2022-08-21 DIAGNOSIS — G8929 Other chronic pain: Secondary | ICD-10-CM

## 2022-08-21 DIAGNOSIS — T84012S Broken internal right knee prosthesis, sequela: Secondary | ICD-10-CM

## 2022-08-21 DIAGNOSIS — M24661 Ankylosis, right knee: Secondary | ICD-10-CM

## 2022-08-21 DIAGNOSIS — Z96651 Presence of right artificial knee joint: Secondary | ICD-10-CM | POA: Diagnosis not present

## 2022-08-21 DIAGNOSIS — Z9889 Other specified postprocedural states: Secondary | ICD-10-CM

## 2022-08-21 DIAGNOSIS — S76309A Unspecified injury of muscle, fascia and tendon of the posterior muscle group at thigh level, unspecified thigh, initial encounter: Secondary | ICD-10-CM | POA: Diagnosis not present

## 2022-08-21 DIAGNOSIS — M25561 Pain in right knee: Secondary | ICD-10-CM

## 2022-08-21 NOTE — Progress Notes (Signed)
Chief Complaint  Patient presents with   Knee Pain    RT knee/ patient states he was bending down at the end of November and he bent down too far. Most of pain is in back of knee and hamstring area    Encounter Diagnoses  Name Primary?   Acute pain of right knee    Arthrofibrosis of knee joint, right    Failed total right knee replacement, sequela    S/P right knee arthroscopy 02/05/18    S/P ACL repair right 05/19/18    Hamstring injury, initial encounter Yes    This 46 year old male with failed total knee on the right.  He presents with acute right knee pain after standing out some Christmas lights.  He hyperflexed the right knee and had pain in the back of his knee since that time  He did try rest and ice but the pain has not improved  Ortho Exam   He ambulated with a cane and he is actually favoring his right leg tiptoe ambulation on that leg just placing the ball of the foot on the ground   There is an incision on the front of the knee there is no erythema or or redness there.  This is a surgical incision from his multiple knee surgeries  The knee looks swollen but there is no effusion  Tenderness is noted on the back of the knee on the medial and lateral hamstring calf is nontender  Dorsiflexion plantarflexion of the calf is normal  The knee is held in about 45 degrees of flexion with relaxation and gets out to about 30 degrees and the flexion arc is from 30 to 90 degrees  Anterior posterior drawer tests are normal  Collateral ligaments show no instability  Muscle tone is normal extensor mechanism is intact    Radiographs  A knee x-ray was obtained there were no signs of loosening or fracture  Encounter Diagnoses  Name Primary?   Acute pain of right knee    Arthrofibrosis of knee joint, right    Failed total right knee replacement, sequela    S/P right knee arthroscopy 02/05/18    S/P ACL repair right 05/19/18    Hamstring injury, initial encounter Yes     Probable hamstring injury  Recommend stretching exercises and strengthening exercises for 4 to 6 weeks He is welcome to follow-up if he does not improve although I am not sure further imaging would helpAnd I think this is something that just has to heal on its own

## 2022-08-21 NOTE — Patient Instructions (Signed)
Apply heat to right hamstrings 30 min prior to exercises   Do exercises once a day

## 2022-08-22 ENCOUNTER — Other Ambulatory Visit: Payer: Self-pay | Admitting: Family Medicine

## 2022-08-22 DIAGNOSIS — I10 Essential (primary) hypertension: Secondary | ICD-10-CM

## 2022-09-18 ENCOUNTER — Encounter: Payer: Self-pay | Admitting: Family Medicine

## 2022-09-18 ENCOUNTER — Ambulatory Visit (INDEPENDENT_AMBULATORY_CARE_PROVIDER_SITE_OTHER): Payer: Medicare Other | Admitting: Family Medicine

## 2022-09-18 VITALS — BP 159/95 | HR 68 | Temp 97.9°F | Ht 71.0 in | Wt 252.0 lb

## 2022-09-18 DIAGNOSIS — I10 Essential (primary) hypertension: Secondary | ICD-10-CM

## 2022-09-18 DIAGNOSIS — R200 Anesthesia of skin: Secondary | ICD-10-CM

## 2022-09-18 DIAGNOSIS — K5903 Drug induced constipation: Secondary | ICD-10-CM

## 2022-09-18 DIAGNOSIS — Z125 Encounter for screening for malignant neoplasm of prostate: Secondary | ICD-10-CM

## 2022-09-18 DIAGNOSIS — Z0000 Encounter for general adult medical examination without abnormal findings: Secondary | ICD-10-CM

## 2022-09-18 DIAGNOSIS — E782 Mixed hyperlipidemia: Secondary | ICD-10-CM

## 2022-09-18 DIAGNOSIS — T402X5A Adverse effect of other opioids, initial encounter: Secondary | ICD-10-CM | POA: Diagnosis not present

## 2022-09-18 DIAGNOSIS — Z720 Tobacco use: Secondary | ICD-10-CM

## 2022-09-18 DIAGNOSIS — Z1211 Encounter for screening for malignant neoplasm of colon: Secondary | ICD-10-CM

## 2022-09-18 MED ORDER — LINACLOTIDE 145 MCG PO CAPS: 145.0000 ug | ORAL_CAPSULE | Freq: Every day | ORAL | 3 refills | Status: DC

## 2022-09-18 MED ORDER — VARENICLINE TARTRATE (STARTER) 0.5 MG X 11 & 1 MG X 42 PO TBPK: ORAL_TABLET | ORAL | 0 refills | Status: DC

## 2022-09-18 MED ORDER — AMLODIPINE BESYLATE 10 MG PO TABS: 10.0000 mg | ORAL_TABLET | Freq: Every day | ORAL | 0 refills | Status: DC

## 2022-09-18 MED ORDER — VARENICLINE TARTRATE 1 MG PO TABS: 1.0000 mg | ORAL_TABLET | Freq: Two times a day (BID) | ORAL | 2 refills | Status: DC

## 2022-09-18 NOTE — Progress Notes (Signed)
I have separately seen and examined the patient. I have discussed the findings and exam with student Dr Jerline Pain and agree with the below note.  My changes/additions are outlined in BLUE.    S: Overall patient has no complaints today.  Needs refills on amlodipine.  Denies any chest pain, shortness of breath or dizziness.  He does report that he has some medication induced constipation that is refractory to MiraLAX, Colace.  He takes Percocet as prescribed by pain management.  Would be willing to try a prescription drug.  No bleeding in stool.  No known family history of colon cancer.  Amenable to Cologuard for colon cancer screening.  He is a daily smoker and has smoked at least 1 pack/day for over 25 to 30 years.  He is down to 10 cigarettes/day but is motivated to come off cigarettes.  He is trialed patches in the past but these were not helpful in stopping smoking.  O:   BP (!) 159/95   Pulse 68   Temp 97.9 F (36.6 C)   Ht '5\' 11"'$  (1.803 m)   Wt 252 lb (114.3 kg)   SpO2 95%   BMI 35.15 kg/m  General appearance: alert, cooperative, appears stated age, and no distress Head: Normocephalic, without obvious abnormality, atraumatic Eyes: conjunctivae/corneas clear. PERRL, EOM's intact. Fundi benign. Ears: normal TM's and external ear canals both ears Nose: Nares normal. Septum midline. Mucosa normal. No drainage or sinus tenderness. Throat: lips, mucosa, and tongue normal; teeth and gums normal Neck: no adenopathy, supple, symmetrical, trachea midline, and thyroid not enlarged, symmetric, no tenderness/mass/nodules Back: symmetric, no curvature. ROM normal. No CVA tenderness. Lungs: clear to auscultation bilaterally Chest wall: no tenderness Heart: regular rate and rhythm, S1, S2 normal, no murmur, click, rub or gallop Abdomen: soft, non-tender; bowel sounds normal; no masses,  no organomegaly Extremities: extremities normal, atraumatic, no cyanosis or edema Pulses: 2+ and  symmetric Skin: Skin color, texture, turgor normal. No rashes or lesions Lymph nodes: Cervical, supraclavicular, and axillary nodes normal. Neurologic: Grossly normal Psych: Mood stable, speech normal, affect appropriate.  Very pleasant and interactive   A/P:  Annual physical exam  Essential hypertension - Plan: Comprehensive metabolic panel, amLODipine (NORVASC) 10 MG tablet  Mixed hyperlipidemia - Plan: Comprehensive metabolic panel, TSH, Lipid panel  Tobacco abuse - Plan: CBC, Varenicline Tartrate, Starter, (CHANTIX STARTING MONTH PAK) 0.5 MG X 11 & 1 MG X 42 TBPK, varenicline (CHANTIX CONTINUING MONTH PAK) 1 MG tablet  Screening for malignant neoplasm of colon - Plan: Cologuard  Bilateral hand numbness - Plan: Bayer DCA Hb A1c Waived, Vitamin B12  Screening for malignant neoplasm of prostate - Plan: PSA  Therapeutic opioid-induced constipation (OIC) - Plan: linaclotide (LINZESS) 145 MCG CAPS capsule  Trial of Linzess for therapeutic opioid-induced constipation.  Cologuard for colon cancer screening.  No para contraindications.  Blood pressure is NOT well-controlled and we renewed his amlodipine.  Would like him to have blood pressure recheck with nurse in 2 weeks.  Check CMP, fasting lipid panel  He has reported some bilateral hand numbness that is intermittent, we will look for metabolic causes but I suspect this is likely coming from either carpal tunnel syndrome or from his neck.  He is very motivated to try and come off of tobacco so I have prescribed him Chantix.  Patient Cuny follow-up with me in the next 3 months to recheck smoking, constipation   Laportia Carley M. Lajuana Ripple, [facility]   -------------------------------------------------------------------------------------------------------------------------------------------------------------------------------------  Subjective: PCP: [name], DO HQP:RFFMBW W Graig is a 47 y.o.  male presenting to clinic today for his annual physical  General  The patient lives with his wife and daughter (24). He does not see a dentist. He has an eye doctor and is planning on making an appointment in the coming months.  Patient smokes one half pack per day, decreased from a full pack previously. Has a 23 pack year smoking history. He interested in quitting.   Patient reports occasional constipation secondary to opioid use. Uses Miralax daily. Bo blood in the stool. Does not feel like he fully empties his bowels when he uses the toilet.   Hypertension  Hyperlipidemia  No changes to diet or exercise have occurred since we last saw him. His typical diet includes Pop-Tarts, cereal, eating out for lunch, chicken and mashed potatoes, and beans. He rarely eats vegetables.Does not exercise or check blood pressures at home. Is compliant with his medications. No headaches, shortness of breath, chest pain, night sweats, fevers, numbness or tingling in his feet. Does not occasional dizziness and tingling in his upper extremities.   ROS: Per HPI  No Known Allergies Past Medical History:  Diagnosis Date   GERD (gastroesophageal reflux disease)    History of stomach ulcers    Hypertension     Current Outpatient Medications:    aspirin EC 325 MG tablet, Take 325 mg by mouth daily., Disp: , Rfl:    Cholecalciferol (VITAMIN D) 125 MCG (5000 UT) CAPS, Take 5,000 Units by mouth daily., Disp: , Rfl:    docusate sodium (COLACE) 100 MG capsule, Take 100 mg by mouth daily., Disp: , Rfl:    linaclotide (LINZESS) 145 MCG CAPS capsule, Take 1 capsule (145 mcg total) by mouth daily before breakfast. For constipation, Disp: 90 capsule, Rfl: 3   methocarbamol (ROBAXIN) 500 MG tablet, Take 1 tablet (500 mg total) by mouth every 6 (six) hours as needed for muscle spasms., Disp: 30 tablet, Rfl: 0   naloxone (NARCAN) nasal spray 4 mg/0.1 mL, Place 0.4 mg into the nose once., Disp: , Rfl:     oxyCODONE-acetaminophen (PERCOCET) 7.5-325 MG tablet, Take 1 tablet by mouth every 8 (eight) hours as needed for severe pain., Disp: , Rfl:    pregabalin (LYRICA) 100 MG capsule, Take 100 mg by mouth 3 (three) times daily as needed (pain)., Disp: , Rfl:    traZODone (DESYREL) 50 MG tablet, Take 50 mg by mouth at bedtime as needed for sleep., Disp: , Rfl:    varenicline (CHANTIX CONTINUING MONTH PAK) 1 MG tablet, Take 1 tablet (1 mg total) by mouth 2 (two) times daily., Disp: 60 tablet, Rfl: 2   Varenicline Tartrate, Starter, (CHANTIX STARTING MONTH PAK) 0.5 MG X 11 & 1 MG X 42 TBPK, Take 0.5 mg tablet by mouth once daily x3 days, then 0.5 mg tablet twice daily x4 days, then increase to one 1 mg tablet twice daily., Disp: 53 each, Rfl: 0   amLODipine (NORVASC) 10 MG tablet, Take 1 tablet (10 mg total) by mouth daily., Disp: 30 tablet, Rfl: 0 Social History   Socioeconomic History   Marital status: Married    Spouse name: Not on file   Number of children: 3   Years of education: Not on file   Highest education level: Not on file  Occupational History   Not on file  Tobacco Use   Smoking status: Every Day    Packs/day: 0.50    Years: 15.00  Total pack years: 7.50    Types: Cigarettes    Start date: 07/07/1991    Last attempt to quit: 04/09/2018    Years since quitting: 4.4   Smokeless tobacco: Former    Types: Chew    Quit date: 06/09/2018  Vaping Use   Vaping Use: Never used  Substance and Sexual Activity   Alcohol use: No   Drug use: No   Sexual activity: Yes  Other Topics Concern   Not on file  Social History Narrative   Not on file   Social Determinants of Health   Financial Resource Strain: Not on file  Food Insecurity: Not on file  Transportation Needs: Not on file  Physical Activity: Not on file  Stress: Not on file  Social Connections: Not on file  Intimate Partner Violence: Not on file   Family History  Problem Relation Age of Onset   Diabetes Mother     Heart failure Mother    Cancer Mother    Pancreatic cancer Mother    Cancer Father    Throat cancer Father    Healthy Sister    Head & neck cancer Brother    Bone cancer Maternal Grandmother    Diabetes Maternal Grandmother    CAD Maternal Grandfather    Brain cancer Maternal Grandfather    Colon cancer Paternal Grandfather     Objective: Office vital signs reviewed. BP (!) 159/95   Pulse 68   Temp 97.9 F (36.6 C)   Ht '5\' 11"'$  (1.803 m)   Wt 252 lb (114.3 kg)   SpO2 95%   BMI 35.15 kg/m   Physical Exam General: Awake, alert, well nourished, No acute distress HEENT: Normal    Neck: No masses palpated. No lymphadenopathy    Ears: Tympanic membranes intact, normal light reflex, no erythema, no bulging    Eyes: PERRLA, extraocular membranes intact, sclera white    Nose: nasal turbinates moist, no nasal discharge     Throat: moist mucus membranes, no erythema, no tonsillar exudate.  Airway is patent Cardio: regular rate and rhythm, S1S2 heard, no murmurs appreciated Pulm: clear to auscultation bilaterally, no wheezes, rhonchi or rales; normal work of breathing on room air GI: soft, non-distended, bowel sounds present x4, no hepatomegaly, no splenomegaly, no masses. Patient appears to have some discomfort with palpation over bladder and right upper quadrant. Notes some neuropathic pain over the right 8th rib.  Extremities: warm, well perfused, No edema, cyanosis or clubbing; +1 pulses bilaterally Skin: dry; intact; no rashes or lesions. Skin dryness present on scalp.   Assessment/ Plan: 47 y.o. male   Counseled the patient on the importance of increasing fiber and nutrition in his diet through introducing vegetables and decreasing intake of simple carbohydrates, sodium, and fried foods. Blood pressure is elevated today. Will plan to re-check blood pressure tomorrow at the patient's lab appointment. Counseled the patient on the importance of decreased sodium intake for blood  pressure control. If no changes to blood pressure tomorrow, will plan to add an additional agent such as losartan.  I am pleased that the patient has made such progress with cutting down his tobacco use by half a pack per day. He is currently smoking 10 Marlboro cigarettes per day. Discussed with the patient options for quitting smoking, including nicotine patches, gum, and varenicline. The patient expressed interest in varenicline. Appears to be in the action stage of change and is motivated to make lifestyle modifications regarding smoking. I will start the patient  on varenicline and do not have any concerns about this medication for him at this time.   Given that the patient has had opioid-induced constipation for over a year that appears to be somewhat refractory to Miralax, I will provide a prescription for Linzess to attempt to alleviate these symptoms.  Orders Placed This Encounter  Procedures   Cologuard   Comprehensive metabolic panel    Standing Status:   Future    Standing Expiration Date:   09/19/2023   TSH    Standing Status:   Future    Standing Expiration Date:   09/19/2023   Lipid panel    Standing Status:   Future    Standing Expiration Date:   09/19/2023   CBC    Standing Status:   Future    Standing Expiration Date:   09/19/2023   Bayer DCA Hb A1c Waived    Standing Status:   Future    Standing Expiration Date:   09/19/2023   Vitamin B12    Standing Status:   Future    Standing Expiration Date:   09/19/2023   PSA    Standing Status:   Future    Standing Expiration Date:   09/19/2023   Meds ordered this encounter  Medications   amLODipine (NORVASC) 10 MG tablet    Sig: Take 1 tablet (10 mg total) by mouth daily.    Dispense:  30 tablet    Refill:  0   Varenicline Tartrate, Starter, (CHANTIX STARTING MONTH PAK) 0.5 MG X 11 & 1 MG X 42 TBPK    Sig: Take 0.5 mg tablet by mouth once daily x3 days, then 0.5 mg tablet twice daily x4 days, then increase to one 1 mg tablet  twice daily.    Dispense:  53 each    Refill:  0   varenicline (CHANTIX CONTINUING MONTH PAK) 1 MG tablet    Sig: Take 1 tablet (1 mg total) by mouth 2 (two) times daily.    Dispense:  60 tablet    Refill:  2   linaclotide (LINZESS) 145 MCG CAPS capsule    Sig: Take 1 capsule (145 mcg total) by mouth daily before breakfast. For constipation    Dispense:  90 capsule    Refill:  [place], MS3

## 2022-09-18 NOTE — Patient Instructions (Signed)
Preventive Care 49-49 Years Old, Male Preventive care refers to lifestyle choices and visits with your health care provider that can promote health and wellness. Preventive care visits are also called wellness exams. What can I expect for my preventive care visit? Counseling During your preventive care visit, your health care provider Juba ask about your: Medical history, including: Past medical problems. Family medical history. Current health, including: Emotional well-being. Home life and relationship well-being. Sexual activity. Lifestyle, including: Alcohol, nicotine or tobacco, and drug use. Access to firearms. Diet, exercise, and sleep habits. Safety issues such as seatbelt and bike helmet use. Sunscreen use. Work and work Statistician. Physical exam Your health care provider will check your: Height and weight. These Stann be used to calculate your BMI (body mass index). BMI is a measurement that tells if you are at a healthy weight. Waist circumference. This measures the distance around your waistline. This measurement also tells if you are at a healthy weight and [place] help predict your risk of certain diseases, such as type 2 diabetes and high blood pressure. Heart rate and blood pressure. Body temperature. Skin for abnormal spots. What immunizations do I need?  Vaccines are usually given at various ages, according to a schedule. Your health care provider will recommend vaccines for you based on your age, medical history, and lifestyle or other factors, such as travel or where you work. What tests do I need? Screening Your health care provider Nap[place]ala recommend screening tests for certain conditions. This Koehn include: Lipid and cholesterol levels. Diabetes screening. This is done by checking your blood sugar (glucose) after you have not eaten for a while (fasting). Hepatitis B test. Hepatitis C test. HIV (human immunodeficiency virus) test. STI (sexually transmitted infection)  testing, if you are at risk. Lung cancer screening. Prostate cancer screening. Colorectal cancer screening. Talk with your health care provider about your test results, treatment options, and if necessary, the need for more tests. Follow these instructions at home: Eating and drinking  Eat a diet that includes fresh fruits and vegetables, whole grains, lean protein, and low-fat dairy products. Take vitamin and mineral supplements as recommended by your health care provider. Do not drink alcohol if your health care provider tells you not to drink. If you drink alcohol: Limit how much you have to 0-2 drinks a day. Know how much alcohol is in your drink. In the [place] one drink equals one 12 oz bottle of beer (355 mL), one 5 oz glass of wine (148 mL), or one 1 oz glass of hard liquor (44 mL). Lifestyle Brush your teeth every morning and night with fluoride toothpaste. Floss one time each day. Exercise for at least 30 minutes 5 or more days each week. Do not use any products that contain nicotine or tobacco. These products include cigarettes, chewing tobacco, and vaping devices, such as e-cigarettes. If you need help quitting, ask your health care provider. Do not use drugs. If you are sexually active, practice safe sex. Use a condom or other form of protection to prevent STIs. Take aspirin only as told by your health care provider. Make sure that you understand how much to take and what form to take. Work with your health care provider to find out whether it is safe and beneficial for you to take aspirin daily. Find healthy ways to manage stress, such as: Meditation, yoga, or listening to music. Journaling. Talking to a trusted person. Spending time with friends and family. Minimize exposure to UV radiation to reduce  your risk of skin cancer. ?Safety ?Always wear your seat belt while driving or riding in a vehicle. ?Do not drive: ?If you have been drinking alcohol. Do not ride with someone who  has been drinking. ?When you are tired or distracted. ?While texting. ?If you have been using any mind-altering substances or drugs. ?Wear a helmet and other protective equipment during sports activities. ?If you have firearms in your house, make sure you follow all gun safety procedures. ?What's next? ?Go to your health care provider once a year for an annual wellness visit. ?Ask your health care provider how often you should have your eyes and teeth checked. ?Stay up to date on all vaccines. ?This information is not intended to replace advice given to you by your health care provider. Make sure you discuss any questions you have with your health care provider. ?Document Revised: 02/07/2021 Document Reviewed: 02/07/2021 ?Elsevier Patient Education ? 2023 [employer]. ? ?

## 2022-09-19 ENCOUNTER — Other Ambulatory Visit: Payer: Medicare Other

## 2022-09-19 DIAGNOSIS — Z125 Encounter for screening for malignant neoplasm of prostate: Secondary | ICD-10-CM

## 2022-09-19 DIAGNOSIS — Z720 Tobacco use: Secondary | ICD-10-CM

## 2022-09-19 DIAGNOSIS — I10 Essential (primary) hypertension: Secondary | ICD-10-CM

## 2022-09-19 DIAGNOSIS — E782 Mixed hyperlipidemia: Secondary | ICD-10-CM

## 2022-09-19 DIAGNOSIS — R200 Anesthesia of skin: Secondary | ICD-10-CM

## 2022-09-19 LAB — BAYER DCA HB A1C WAIVED: HB A1C (BAYER DCA - WAIVED): 5.7 % — ABNORMAL HIGH (ref 4.8–5.6)

## 2022-09-20 ENCOUNTER — Other Ambulatory Visit: Payer: Self-pay | Admitting: Family Medicine

## 2022-09-20 DIAGNOSIS — E782 Mixed hyperlipidemia: Secondary | ICD-10-CM

## 2022-09-20 LAB — COMPREHENSIVE METABOLIC PANEL
ALT: 20 IU/L (ref 0–44)
AST: 24 IU/L (ref 0–40)
Albumin/Globulin Ratio: 1.9 (ref 1.2–2.2)
Albumin: 4.3 g/dL (ref 4.1–5.1)
Alkaline Phosphatase: 78 IU/L (ref 44–121)
BUN/Creatinine Ratio: 16 (ref 9–20)
BUN: 15 mg/dL (ref 6–24)
Bilirubin Total: 0.2 mg/dL (ref 0.0–1.2)
CO2: 24 mmol/L (ref 20–29)
Calcium: 9.2 mg/dL (ref 8.7–10.2)
Chloride: 102 mmol/L (ref 96–106)
Creatinine, Ser: 0.94 mg/dL (ref 0.76–1.27)
Globulin, Total: 2.3 g/dL (ref 1.5–4.5)
Glucose: 98 mg/dL (ref 70–99)
Potassium: 4.1 mmol/L (ref 3.5–5.2)
Sodium: 141 mmol/L (ref 134–144)
Total Protein: 6.6 g/dL (ref 6.0–8.5)
eGFR: 100 mL/min/{1.73_m2} (ref >59)

## 2022-09-20 LAB — CBC
Hematocrit: 43.1 % (ref 37.5–51.0)
Hemoglobin: 14.5 g/dL (ref 13.0–17.7)
MCH: 29.3 pg (ref 26.6–33.0)
MCHC: 33.6 g/dL (ref 31.5–35.7)
MCV: 87 fL (ref 79–97)
Platelets: 240 10*3/uL (ref 150–450)
RBC: 4.95 x10E6/uL (ref 4.14–5.80)
RDW: 12.3 % (ref 11.6–15.4)
WBC: 6.1 10*3/uL (ref 3.4–10.8)

## 2022-09-20 LAB — LIPID PANEL
Chol/HDL Ratio: 4.6 ratio (ref 0.0–5.0)
Cholesterol, Total: 223 mg/dL — ABNORMAL HIGH (ref 100–199)
HDL: 48 mg/dL (ref >39)
LDL Chol Calc (NIH): 153 mg/dL — ABNORMAL HIGH (ref 0–99)
Triglycerides: 121 mg/dL (ref 0–149)
VLDL Cholesterol Cal: 22 mg/dL (ref 5–40)

## 2022-09-20 LAB — TSH: TSH: 2.56 u[IU]/mL (ref 0.450–4.500)

## 2022-09-20 LAB — PSA: Prostate Specific Ag, Serum: 0.9 ng/mL (ref 0.0–4.0)

## 2022-09-20 LAB — VITAMIN B12: Vitamin B-12: 344 pg/mL (ref 232–1245)

## 2022-09-20 MED ORDER — ROSUVASTATIN CALCIUM 10 MG PO TABS: 10.0000 mg | ORAL_TABLET | Freq: Every day | ORAL | 3 refills | Status: DC

## 2022-10-21 ENCOUNTER — Other Ambulatory Visit: Payer: Self-pay | Admitting: Family Medicine

## 2022-10-21 DIAGNOSIS — I10 Essential (primary) hypertension: Secondary | ICD-10-CM

## 2022-10-24 ENCOUNTER — Encounter: Payer: Self-pay | Admitting: Radiology

## 2022-12-21 ENCOUNTER — Emergency Department (HOSPITAL_COMMUNITY)
Admission: EM | Admit: 2022-12-21 | Discharge: 2022-12-21 | Disposition: A | Discharge status: Home / Self Care | Payer: Medicare Other | Attending: Emergency Medicine | Admitting: Emergency Medicine

## 2022-12-21 ENCOUNTER — Other Ambulatory Visit: Payer: Self-pay

## 2022-12-21 ENCOUNTER — Encounter (HOSPITAL_COMMUNITY): Payer: Self-pay

## 2022-12-21 ENCOUNTER — Encounter: Payer: Self-pay | Admitting: Emergency Medicine

## 2022-12-21 ENCOUNTER — Ambulatory Visit
Admission: EM | Admit: 2022-12-21 | Discharge: 2022-12-21 | Disposition: A | Discharge status: Home / Self Care | Payer: Medicare Other | Attending: Nurse Practitioner | Admitting: Nurse Practitioner

## 2022-12-21 ENCOUNTER — Emergency Department (HOSPITAL_COMMUNITY): Payer: Medicare Other

## 2022-12-21 DIAGNOSIS — R091 Pleurisy: Secondary | ICD-10-CM | POA: Diagnosis not present

## 2022-12-21 DIAGNOSIS — Z8679 Personal history of other diseases of the circulatory system: Secondary | ICD-10-CM

## 2022-12-21 DIAGNOSIS — I10 Essential (primary) hypertension: Secondary | ICD-10-CM | POA: Diagnosis not present

## 2022-12-21 DIAGNOSIS — Z79899 Other long term (current) drug therapy: Secondary | ICD-10-CM | POA: Insufficient documentation

## 2022-12-21 DIAGNOSIS — R42 Dizziness and giddiness: Secondary | ICD-10-CM | POA: Diagnosis not present

## 2022-12-21 DIAGNOSIS — R0602 Shortness of breath: Secondary | ICD-10-CM

## 2022-12-21 DIAGNOSIS — Z7982 Long term (current) use of aspirin: Secondary | ICD-10-CM | POA: Insufficient documentation

## 2022-12-21 DIAGNOSIS — R110 Nausea: Secondary | ICD-10-CM | POA: Insufficient documentation

## 2022-12-21 DIAGNOSIS — R079 Chest pain, unspecified: Secondary | ICD-10-CM

## 2022-12-21 LAB — CBC WITH DIFFERENTIAL/PLATELET
Abs Immature Granulocytes: 0.01 10*3/uL (ref 0.00–0.07)
Basophils Absolute: 0 10*3/uL (ref 0.0–0.1)
Basophils Relative: 1 %
Eosinophils Absolute: 0.1 10*3/uL (ref 0.0–0.5)
Eosinophils Relative: 1 %
HCT: 44.3 % (ref 39.0–52.0)
Hemoglobin: 14.9 g/dL (ref 13.0–17.0)
Immature Granulocytes: 0 %
Lymphocytes Relative: 22 %
Lymphs Abs: 1.9 10*3/uL (ref 0.7–4.0)
MCH: 30 pg (ref 26.0–34.0)
MCHC: 33.6 g/dL (ref 30.0–36.0)
MCV: 89.1 fL (ref 80.0–100.0)
Monocytes Absolute: 0.6 10*3/uL (ref 0.1–1.0)
Monocytes Relative: 7 %
Neutro Abs: 6.1 10*3/uL (ref 1.7–7.7)
Neutrophils Relative %: 69 %
Platelets: 242 10*3/uL (ref 150–400)
RBC: 4.97 MIL/uL (ref 4.22–5.81)
RDW: 13.2 % (ref 11.5–15.5)
WBC: 8.8 10*3/uL (ref 4.0–10.5)
nRBC: 0 % (ref 0.0–0.2)

## 2022-12-21 LAB — LIPID PANEL
Cholesterol: 154 mg/dL (ref 0–200)
HDL: 45 mg/dL (ref >40)
LDL Cholesterol: 87 mg/dL (ref 0–99)
Total CHOL/HDL Ratio: 3.4 RATIO
Triglycerides: 112 mg/dL (ref <150)
VLDL: 22 mg/dL (ref 0–40)

## 2022-12-21 LAB — COMPREHENSIVE METABOLIC PANEL
ALT: 18 U/L (ref 0–44)
AST: 21 U/L (ref 15–41)
Albumin: 4.3 g/dL (ref 3.5–5.0)
Alkaline Phosphatase: 72 U/L (ref 38–126)
Anion gap: 10 (ref 5–15)
BUN: 14 mg/dL (ref 6–20)
CO2: 22 mmol/L (ref 22–32)
Calcium: 8.8 mg/dL — ABNORMAL LOW (ref 8.9–10.3)
Chloride: 105 mmol/L (ref 98–111)
Creatinine, Ser: 0.81 mg/dL (ref 0.61–1.24)
GFR, Estimated: >60 mL/min (ref >60)
Glucose, Bld: 80 mg/dL (ref 70–99)
Potassium: 3.3 mmol/L — ABNORMAL LOW (ref 3.5–5.1)
Sodium: 137 mmol/L (ref 135–145)
Total Bilirubin: 0.5 mg/dL (ref 0.3–1.2)
Total Protein: 7.6 g/dL (ref 6.5–8.1)

## 2022-12-21 LAB — PROTIME-INR
INR: 0.9 (ref 0.8–1.2)
Prothrombin Time: 12.4 seconds (ref 11.4–15.2)

## 2022-12-21 LAB — APTT: aPTT: 27 seconds (ref 24–36)

## 2022-12-21 LAB — TROPONIN I (HIGH SENSITIVITY): Troponin I (High Sensitivity): 4 ng/L (ref <18)

## 2022-12-21 LAB — HEMOGLOBIN A1C
Hgb A1c MFr Bld: 5.6 % (ref 4.8–5.6)
Mean Plasma Glucose: 114.02 mg/dL

## 2022-12-21 LAB — D-DIMER, QUANTITATIVE: D-Dimer, Quant: 0.45 ug/mL-FEU (ref 0.00–0.50)

## 2022-12-21 MED ORDER — SODIUM CHLORIDE 0.9 % IV SOLN: INTRAVENOUS | Status: DC

## 2022-12-21 MED ORDER — ALUM & MAG HYDROXIDE-SIMETH 200-200-20 MG/5ML PO SUSP
30.0000 mL | Freq: Once | ORAL | Status: AC
  Administered 2022-12-21: 30 mL via ORAL
  Filled 2022-12-21: qty 30

## 2022-12-21 MED ORDER — KETOROLAC TROMETHAMINE 15 MG/ML IJ SOLN
15.0000 mg | Freq: Once | INTRAMUSCULAR | Status: AC
  Administered 2022-12-21: 15 mg via INTRAVENOUS
  Filled 2022-12-21: qty 1

## 2022-12-21 MED ORDER — ASPIRIN 81 MG PO CHEW
324.0000 mg | CHEWABLE_TABLET | Freq: Once | ORAL | Status: AC
  Administered 2022-12-21: 324 mg via ORAL

## 2022-12-21 NOTE — ED Triage Notes (Signed)
Chest pain since last night, SOB started this morning.  Neck, left arm and shoulder is achy feeling down in to fingers.

## 2022-12-21 NOTE — ED Triage Notes (Signed)
Patient arrives to ED via [facility], Alert and oriented  x 4, Reports having pain in mid chest on last night. Pain progressively getting worse. Pt. reports today chest pain continues along with SOB.EMS gave Nitro and ASA 324 mg en route, pain unrelieved. Started O2 @ 2 L/via [place].

## 2022-12-21 NOTE — ED Triage Notes (Signed)
States he felt this way the last time he had pneumonia. States he feels nauseated.

## 2022-12-21 NOTE — ED Notes (Signed)
[facility] here to transport patient 

## 2022-12-21 NOTE — Discharge Instructions (Signed)
Patient transported to [facility] via EMS.

## 2022-12-21 NOTE — ED Notes (Signed)
Patient is being discharged from the Urgent Care and sent to the Emergency Department via [facility] . Per NP, patient is in need of higher level of care due to chest pain, pain in left neck, shoulder and fingers. Patient is aware and verbalizes understanding of plan of care.  Vitals:   12/21/22 1142 12/21/22 1159  BP: (!) 169/94   Pulse: (!) 106   Resp: 20   Temp: 97.8 F (36.6 C)   SpO2: 98% 97%

## 2022-12-21 NOTE — Discharge Instructions (Addendum)
It was a pleasure taking care of you today. Blood work was reassuring. Chest xray did not show any signs of pneumonia.   Seek emergency care if experiencing new or worsening symptoms.

## 2022-12-21 NOTE — ED Provider Notes (Signed)
[place] EMERGENCY DEPARTMENT AT [facility] Provider Note   CSN: [id number] Arrival date & time: 12/21/22  1226     History  Chief Complaint  Patient presents with   Chest Pain    Victor [name] is a 47 y.o. male with PMHx of GERD, HTN who presents to ED complaining of chest pain since 7PM last night. Pain is described as "pressure" located in the center of his chest and does not radiate. Patient also endorses dizziness and nausea since this morning. Endorses troubles breathing due to pain with inhalation. Denies fever, chills, nausea, vomiting, diarrhea, abdominal pain.   Chest Pain      Home Medications Prior to Admission medications   Medication Sig Start Date End Date Taking? Authorizing Provider  amLODipine (NORVASC) 10 MG tablet TAKE 1 TABLET ONCE DAILY. 10/22/22   Raliegh Ip, DO  aspirin EC 325 MG tablet Take 325 mg by mouth daily.    [provider]  Cholecalciferol (VITAMIN D) 125 MCG (5000 UT) CAPS Take 5,000 Units by mouth daily.    [provider]  docusate sodium (COLACE) 100 MG capsule Take 100 mg by mouth daily.    [provider]  linaclotide Karlene Einstein) 145 MCG CAPS capsule Take 1 capsule (145 mcg total) by mouth daily before breakfast. For constipation 09/18/22   Delynn Flavin M, DO  methocarbamol (ROBAXIN) 500 MG tablet Take 1 tablet (500 mg total) by mouth every 6 (six) hours as needed for muscle spasms. 10/13/20   Vickki Hearing, MD  naloxone [facility]) nasal spray 4 mg/0.1 mL Place 0.4 mg into the nose once. 10/20/20   [provider]  oxyCODONE-acetaminophen (PERCOCET) 7.5-325 MG tablet Take 1 tablet by mouth every 8 (eight) hours as needed for severe pain.    [provider]  pregabalin (LYRICA) 100 MG capsule Take 100 mg by mouth 3 (three) times daily as needed (pain). 01/12/22   [provider]  rosuvastatin (CRESTOR) 10 MG tablet Take 1 tablet (10 mg total) by mouth daily. 09/20/22    Raliegh Ip, DO  traZODone (DESYREL) 50 MG tablet Take 50 mg by mouth at bedtime as needed for sleep.    [provider]  varenicline (CHANTIX CONTINUING MONTH PAK) 1 MG tablet Take 1 tablet (1 mg total) by mouth 2 (two) times daily. 09/18/22   Raliegh Ip, DO  Varenicline Tartrate, Starter, (CHANTIX STARTING MONTH PAK) 0.5 MG X 11 & 1 MG X 42 TBPK Take 0.5 mg tablet by mouth once daily x3 days, then 0.5 mg tablet twice daily x4 days, then increase to one 1 mg tablet twice daily. 09/18/22   Raliegh Ip, DO      Allergies    Patient has no known allergies.    Review of Systems   Review of Systems  Cardiovascular:  Positive for chest pain.    Physical Exam Updated Vital Signs BP 131/86 (BP Location: Left Arm)   Pulse 61   Temp 97.8 F (36.6 C) (Oral)   Resp (!) 28   Ht 5\' 11"  (1.803 m)   Wt 113.4 kg   SpO2 100%   BMI 34.87 kg/m  Physical Exam Vitals and nursing note reviewed.  Constitutional:      General: He is not in acute distress.    Appearance: He is not toxic-appearing.  HENT:     Head: Normocephalic and atraumatic.     Mouth/Throat:     Mouth: Mucous membranes are moist.  Pharynx: No oropharyngeal exudate or posterior oropharyngeal erythema.  Eyes:     General: No scleral icterus.       Right eye: No discharge.        Left eye: No discharge.     Conjunctiva/sclera: Conjunctivae normal.  Cardiovascular:     Rate and Rhythm: Normal rate and regular rhythm.     Pulses: Normal pulses.          Radial pulses are 2+ on the right side and 2+ on the left side.       Dorsalis pedis pulses are 2+ on the right side and 2+ on the left side.     Heart sounds: Normal heart sounds. No murmur heard. Pulmonary:     Effort: Pulmonary effort is normal. No respiratory distress.     Breath sounds: No decreased breath sounds, wheezing, rhonchi or rales.  Chest:     Chest wall: No tenderness.  Abdominal:     General: Bowel sounds are normal.      Palpations: Abdomen is soft.     Tenderness: There is no abdominal tenderness.  Musculoskeletal:     Right lower leg: No tenderness. No edema.     Left lower leg: No tenderness. No edema.  Skin:    General: Skin is warm and dry.     Findings: No rash.  Neurological:     General: No focal deficit present.     Mental Status: He is alert. Mental status is at baseline.     Motor: No weakness.  Psychiatric:        Mood and Affect: Mood normal.     ED Results / Procedures / Treatments   Labs (all labs ordered are listed, but only abnormal results are displayed) Labs Reviewed  COMPREHENSIVE METABOLIC PANEL - Abnormal; Notable for the following components:      Result Value   Potassium 3.3 (*)    Calcium 8.8 (*)    All other components within normal limits  CBC WITH DIFFERENTIAL/PLATELET  PROTIME-INR  APTT  LIPID PANEL  D-DIMER, QUANTITATIVE  HEMOGLOBIN A1C  TROPONIN I (HIGH SENSITIVITY)    EKG EKG Interpretation  Date/Time:  Saturday December 21 2022 12:33:11 EDT Ventricular Rate:  66 PR Interval:  178 QRS Duration: 90 QT Interval:  381 QTC Calculation: 400 R Axis:   54 Text Interpretation: Sinus rhythm Confirmed by Eber Hong ([id number]) on 12/21/2022 12:41:33 PM  Radiology DG Chest Port 1 View  Result Date: 12/21/2022 CLINICAL DATA:  Chest pain EXAM: PORTABLE CHEST 1 VIEW COMPARISON:  06/04/2022 FINDINGS: The lungs are clear without focal pneumonia, edema, pneumothorax or pleural effusion. The cardiopericardial silhouette is within normal limits for size. No acute bony abnormality. Telemetry leads overlie the chest. IMPRESSION: No active disease. Electronically Signed   By: Kennith Center M.D.   On: 12/21/2022 13:34    Procedures Procedures    Medications Ordered in ED Medications  alum & mag hydroxide-simeth (MAALOX/MYLANTA) 200-200-20 MG/5ML suspension 30 mL (30 mLs Oral Given 12/21/22 1453)  ketorolac (TORADOL) 15 MG/ML injection 15 mg (15 mg Intravenous Given  12/21/22 1454)    ED Course/ Medical Decision Making/ A&P                             Medical Decision Making  This patient presents to the ED for concern of chest pain, this involves an extensive number of treatment options, and is a complaint that carries with  it a high risk of complications and morbidity.  The differential diagnosis includes acute coronary syndrome, pericarditis, pneumonia, pulmonary embolism, tension pneumothorax, aortic dissection, cardiac tamponade, musculoskeletal   Co morbidities that complicate the patient evaluation  none   Additional history obtained:  none   Lab Tests:  I Ordered, and personally interpreted labs.  The pertinent results include:  - Troponin: within normal limits - CMP: no concern for electrolyte abnormality; no concern for kidney/liver damage - CBC: No concern for anemia or leukocytosis - APTT: within normal limits -PT-INR: within normal limits - D Dimer: within normal limits    Imaging Studies ordered:  I ordered imaging studies including Chest Xray -no active disease I independently visualized and interpreted imaging  I agree with the radiologist interpretation   Cardiac Monitoring: / EKG:  The patient was maintained on a cardiac monitor.  I personally viewed and interpreted the cardiac monitored which showed an underlying rhythm of: Sinus rhythm without acute ST changes   Risk Stratification Score:  - HEART Score: 3 points - low risk - Perc Score: 0   Problem List / ED Course / Critical interventions / Medication management  Patient presented for chest pain. Patient given Aspirin during urgent care visit right before ED arrival. EKG and troponin reassuring. Physical exam unremarkable except for tachypnea. Imaging without concern for PNA. Patient afebrile with stable vitals and oxygen >96% on room air.  Ddx:  These are considered less likely due to history of present illness and physical exam findings.  Acute  coronary syndrome: EKG and troponins within normal limits  Pericarditis: pain is not positional and patient denies orthopnea and recent illness Pulmonary embolism: no recent surgeries, blood clot hx, hemoptysis, cancer hx, vitals stable; d-dimer within normal limits Pneumothorax: chest xray and physical exam without concern Aortic dissection: vital signs are stable, no variation in pulse pressure Cardiac tamponade: absence of hypotension, JVD, and muffled heart sounds  Patient educated on lab and imaging results. I shared with patient that I do not think he needs inpatient treatment at this time. Patient agreed stating that he is ready to go home. Recommended patient follow up with PCP. I have reviewed the patients home medicines and have made adjustments as needed Counseled patient on Tobacco Cessation Patient was given return precautions. Patient stable for discharge at this time.  Patient verbalized understanding of plan.    Social Determinants of Health:  none          Final Clinical Impression(s) / ED Diagnoses Final diagnoses:  Pleurisy    Rx / DC Orders ED Discharge Orders     None         Margarita Rana 12/21/22 1555    Eber Hong, MD 12/22/22 [phone]

## 2022-12-21 NOTE — ED Notes (Signed)
Patient is being discharged from the Urgent Care and sent to the Emergency Department via EMS . Per Devra Dopp NP, patient is in need of higher level of care due to Chest Pain and SOB. Patient is aware and verbalizes understanding of plan of care. EKG has been complete, an IV line started, pt has taken (4) Aspirin 81 milligram.  Vitals:   12/21/22 1142  BP: (!) 169/94  Pulse: (!) 106  Resp: 20  Temp: 97.8 F (36.6 C)  SpO2: 98%

## 2022-12-21 NOTE — ED Provider Notes (Addendum)
[facility]    CSN: [id number] Arrival date & time: 12/21/22  1139      History   Chief Complaint No chief complaint on file.   HPI Victor [name] is a 47 y.o. male.   The history is provided by the patient.   The patient presents for complaints of chest pain with shortness of breath that started over the past 24 hours.  Patient states that this morning when he woke up, he had increasing "pressure" in his chest.  He states that he also has pain that radiates down into the left arm and into the left jaw and neck.  Patient states that he also feels nauseated.  Patient denies fever, chills, cough, abdominal pain, vomiting, or diarrhea.  Patient does have a history of hypertension.  He reports that he is a 20-year 1 pack/day smoker.  Patient states that he thought that this was pneumonia as he felt similar to this previously.  He does have a history of chest pain, patient has a family history of CAD.  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   S/P hardware removal right knee 09/21/19 10/04/2019   Essential hypertension 04/07/2019   Family history of early CAD 04/07/2019   History of chest pain 03/10/2019   Hyperlipidemia 03/10/2019   Tobacco abuse 03/10/2019   S/P right knee arthroscopy 02/05/18 06/23/2017   S/P ACL repair right 05/19/18    Chondromalacia of lateral femoral condyle, right    Chondromalacia, patella,  right     Past Surgical History:  Procedure Laterality Date   ANTERIOR CRUCIATE LIGAMENT REPAIR Right 05/19/2018   Procedure: RIGHT KNEE ARTHROSCOPY WITH ANTERIOR CRUCIATE LIGAMENT (ACL) REPAIR and MEDIAL MENISECTOMY;  Surgeon: Vickki Hearing, MD;  Location: [facility];  Service: Orthopedics;  Laterality: Right;   APPENDECTOMY     DECORTICATION Left 07/23/2021   Procedure: DECORTICATION of left lung;  Surgeon: Alleen Borne, MD;  Location: [facility] OR;  Service: Thoracic;  Laterality: Left;   HARDWARE REMOVAL Right 09/21/2019   Procedure: HARDWARE REMOVAL (RICHARD'S STAPLE) RIGHT KNEE;  Surgeon: Vickki Hearing, MD;  Location: [facility];  Service: Orthopedics;  Laterality: Right;   IR PERC PLEURAL DRAIN W/INDWELL CATH W/IMG GUIDE  07/19/2021   KNEE ARTHROSCOPY WITH MEDIAL MENISECTOMY Right 05/29/2017   Procedure: KNEE ARTHROSCOPY WITH MEDIAL MENISECTOMY and lateral meniscectomy;  Surgeon: Vickki Hearing, MD;  Location: [facility];  Service: Orthopedics;  Laterality: Right;   KNEE ARTHROSCOPY WITH MEDIAL MENISECTOMY Right 02/05/2018   Procedure: RIGHT KNEE ARTHROSCOPY WITH PARTIAL MEDIAL AND LATERAL MENISECTOMY;  Surgeon: Vickki Hearing, MD;  Location: [facility];  Service: Orthopedics;  Laterality: Right;   KNEE ARTHROSCOPY WITH MEDIAL MENISECTOMY Left 04/23/2022   Procedure: KNEE ARTHROSCOPY WITH MEDIAL MENISCECTOMY;  Surgeon: Vickki Hearing, MD;  Location: [facility];  Service: Orthopedics;  Laterality: Left;   THORACOTOMY/LOBECTOMY Left  07/23/2021   Procedure: THORACOTOMY with drainage of empyema;  Surgeon: Alleen Borne, MD;  Location: [facility] OR;  Service: Thoracic;  Laterality: Left;   TOTAL KNEE ARTHROPLASTY Right 03/28/2020   Procedure: TOTAL KNEE ARTHROPLASTY;  Surgeon: Vickki Hearing, MD;  Location: [facility];  Service: Orthopedics;  Laterality: Right;   TOTAL KNEE REVISION Right 08/08/2020   Procedure: REVISION OF RIGHT TOTAL KNEE  TIBIAL COMPONENTS ONLY;  Surgeon: Vickki Hearing,  MD;  Location: [facility];  Service: Orthopedics;  Laterality: Right;       Home Medications    Prior to Admission medications   Medication Sig Start Date End Date Taking? Authorizing Provider  amLODipine (NORVASC) 10 MG tablet TAKE 1 TABLET ONCE DAILY. 10/22/22   Raliegh Ip, DO  aspirin EC 325 MG tablet Take 325 mg by mouth daily.    [provider]  Cholecalciferol (VITAMIN D) 125 MCG (5000 UT) CAPS Take 5,000 Units by mouth daily.    [provider]  docusate sodium (COLACE) 100 MG capsule Take 100 mg by mouth daily.    [provider]  linaclotide Karlene Einstein) 145 MCG CAPS capsule Take 1 capsule (145 mcg total) by mouth daily before breakfast. For constipation 09/18/22   Delynn Flavin M, DO  methocarbamol (ROBAXIN) 500 MG tablet Take 1 tablet (500 mg total) by mouth every 6 (six) hours as needed for muscle spasms. 10/13/20   Vickki Hearing, MD  naloxone [facility]) nasal spray 4 mg/0.1 mL Place 0.4 mg into the nose once. 10/20/20   [provider]  oxyCODONE-acetaminophen (PERCOCET) 7.5-325 MG tablet Take 1 tablet by mouth every 8 (eight) hours as needed for severe pain.    [provider]  pregabalin (LYRICA) 100 MG capsule Take 100 mg by mouth 3 (three) times daily as needed (pain). 01/12/22   [provider]  rosuvastatin (CRESTOR) 10 MG tablet Take 1 tablet (10 mg total) by mouth daily. 09/20/22   Raliegh Ip, DO  traZODone (DESYREL) 50 MG tablet Take 50 mg by mouth at bedtime as needed for sleep.    [provider]  varenicline (CHANTIX CONTINUING MONTH PAK) 1 MG tablet Take 1 tablet (1 mg total) by mouth 2 (two) times daily. 09/18/22   Raliegh Ip, DO  Varenicline Tartrate, Starter, (CHANTIX STARTING MONTH PAK) 0.5 MG X 11 & 1 MG X 42 TBPK Take 0.5 mg tablet by mouth once daily x3 days, then 0.5 mg tablet twice daily x4 days, then increase to one 1 mg tablet twice daily. 09/18/22   Raliegh Ip, DO     Family History Family History  Problem Relation Age of Onset   Diabetes Mother    Heart failure Mother    Cancer Mother    Pancreatic cancer Mother    Cancer Father    Throat cancer Father    Healthy Sister    Head & neck cancer Brother    Bone cancer Maternal Grandmother    Diabetes Maternal Grandmother    CAD Maternal Grandfather    Brain cancer Maternal Grandfather    Colon cancer Paternal Grandfather     Social History Social History   Tobacco Use   Smoking status: Every Day    Packs/day: 0.50    Years: 15.00    Additional pack years: 0.00    Total pack years: 7.50    Types: Cigarettes    Start date: 07/07/1991    Last attempt to quit: 04/09/2018  Years since quitting: 4.7   Smokeless tobacco: Former    Types: Chew    Quit date: 06/09/2018  Vaping Use   Vaping Use: Never used  Substance Use Topics   Alcohol use: No   Drug use: No     Allergies   Patient has no known allergies.   Review of Systems Review of Systems Per HPI  Physical Exam Triage Vital Signs ED Triage Vitals  Enc Vitals Group     BP 12/21/22 1142 (!) 169/94     Pulse Rate 12/21/22 1142 (!) 106     Resp 12/21/22 1142 20     Temp 12/21/22 1142 97.8 F (36.6 C)     Temp Source 12/21/22 1142 Oral     SpO2 12/21/22 1142 98 %     Weight --      Height --      Head Circumference --      Peak Flow --      Pain Score 12/21/22 1144 7     Pain Loc --      Pain Edu? --      Excl. in GC? --    No data found.  Updated Vital Signs BP (!) 169/94 (BP Location: Right Arm)   Pulse (!) 106   Temp 97.8 F (36.6 C) (Oral)   Resp 20   SpO2 97%   Visual Acuity Right Eye Distance:   Left Eye Distance:   Bilateral Distance:    Right Eye Near:   Left Eye Near:    Bilateral Near:     Physical Exam Vitals and nursing note reviewed.  Constitutional:      Appearance: He is ill-appearing and diaphoretic.  HENT:     Head: Normocephalic.  Eyes:     Extraocular Movements:  Extraocular movements intact.     Pupils: Pupils are equal, round, and reactive to light.  Cardiovascular:     Rate and Rhythm: Normal rate and regular rhythm.     Pulses: Normal pulses.     Heart sounds: Normal heart sounds.  Pulmonary:     Effort: Pulmonary effort is normal.     Breath sounds: Normal breath sounds.  Abdominal:     General: Bowel sounds are normal.     Palpations: Abdomen is soft.  Musculoskeletal:     Cervical back: Normal range of motion.  Skin:    General: Skin is warm.  Neurological:     General: No focal deficit present.     Mental Status: He is alert and oriented to person, place, and time.  Psychiatric:        Mood and Affect: Mood normal.        Behavior: Behavior normal.      UC Treatments / Results  Labs (all labs ordered are listed, but only abnormal results are displayed) Labs Reviewed - No data to display  EKG: NSR.  Compared to previous EKGs performed on 07/12/2021 and 07/11/2021.   Radiology No results found.  Procedures Procedures (including critical care time)  Medications Ordered in UC Medications  aspirin chewable tablet 324 mg (324 mg Oral Given 12/21/22 1158)    Initial Impression / Assessment and Plan / UC Course  I have reviewed the triage vital signs and the nursing notes.  Pertinent labs & imaging results that were available during my care of the patient were reviewed by me and considered in my medical decision making (see chart for details).  Patient presents for complaints of midsternal chest  pain, described as chest pressure, with pain radiating into the left jaw and down the left arm.  Patient's blood pressure is currently elevated and he is mildly tachycardic.  HEART score "5". Patient rates pain 7/10 is present.  EKG showed normal sinus rhythm.  Patient appears short of breath, and is ill-appearing.  Given the patient's current presentation, heart score, pain level, and history, it is recommended that the patient go to  the emergency department for further evaluation.  Peripheral IV, aspirin 324 mg, and O2 via nasal cannula at 2 L was initiated.  EMS was called, patient was transported to [facility].  Final Clinical Impressions(s) / UC Diagnoses   Final diagnoses:  Chest pain, unspecified type  Shortness of breath  History of hypertension     Discharge Instructions      Patient transported to [facility] via EMS.     ED Prescriptions   None    PDMP not reviewed this encounter.   Abran Cantor, NP 12/21/22 1317    Rucha Wissinger-Warren, Sadie Haber, NP 12/21/22 1323

## 2022-12-23 ENCOUNTER — Other Ambulatory Visit: Payer: Self-pay | Admitting: *Deleted

## 2022-12-23 DIAGNOSIS — T402X5A Adverse effect of other opioids, initial encounter: Secondary | ICD-10-CM

## 2022-12-23 DIAGNOSIS — E782 Mixed hyperlipidemia: Secondary | ICD-10-CM

## 2022-12-23 MED ORDER — ROSUVASTATIN CALCIUM 10 MG PO TABS
10.0000 mg | ORAL_TABLET | Freq: Every day | ORAL | 0 refills | Status: AC
Start: 2022-12-23 — End: ?

## 2022-12-23 MED ORDER — LINACLOTIDE 145 MCG PO CAPS
145.0000 ug | ORAL_CAPSULE | Freq: Every day | ORAL | 0 refills | Status: DC
Start: 2022-12-23 — End: 2022-12-24

## 2022-12-24 ENCOUNTER — Ambulatory Visit (INDEPENDENT_AMBULATORY_CARE_PROVIDER_SITE_OTHER): Payer: Medicare Other | Admitting: Family Medicine

## 2022-12-24 ENCOUNTER — Telehealth: Payer: Self-pay

## 2022-12-24 ENCOUNTER — Encounter: Payer: Self-pay | Admitting: Family Medicine

## 2022-12-24 VITALS — BP 133/83 | HR 73 | Temp 98.3°F | Ht 71.0 in | Wt 249.0 lb

## 2022-12-24 DIAGNOSIS — K5909 Other constipation: Secondary | ICD-10-CM

## 2022-12-24 DIAGNOSIS — E782 Mixed hyperlipidemia: Secondary | ICD-10-CM | POA: Diagnosis not present

## 2022-12-24 DIAGNOSIS — Z1211 Encounter for screening for malignant neoplasm of colon: Secondary | ICD-10-CM

## 2022-12-24 DIAGNOSIS — K047 Periapical abscess without sinus: Secondary | ICD-10-CM

## 2022-12-24 DIAGNOSIS — F5104 Psychophysiologic insomnia: Secondary | ICD-10-CM

## 2022-12-24 DIAGNOSIS — I10 Essential (primary) hypertension: Secondary | ICD-10-CM | POA: Diagnosis not present

## 2022-12-24 DIAGNOSIS — F419 Anxiety disorder, unspecified: Secondary | ICD-10-CM

## 2022-12-24 MED ORDER — AMLODIPINE BESYLATE 10 MG PO TABS
10.0000 mg | ORAL_TABLET | Freq: Every day | ORAL | 3 refills | Status: AC
Start: 2022-12-24 — End: ?

## 2022-12-24 MED ORDER — AMOXICILLIN 875 MG PO TABS
875.0000 mg | ORAL_TABLET | Freq: Two times a day (BID) | ORAL | 0 refills | Status: AC
Start: 2022-12-24 — End: 2023-01-03

## 2022-12-24 MED ORDER — MIRTAZAPINE 7.5 MG PO TABS
7.5000 mg | ORAL_TABLET | Freq: Every day | ORAL | 0 refills | Status: DC
Start: 2022-12-24 — End: 2023-02-04

## 2022-12-24 MED ORDER — TRULANCE 3 MG PO TABS
1.0000 | ORAL_TABLET | Freq: Every day | ORAL | 3 refills | Status: DC
Start: 2022-12-24 — End: 2023-02-04

## 2022-12-24 NOTE — Transitions of Care (Post Inpatient/ED Visit) (Signed)
   12/24/2022  Name: Victor [name] MRN: [id number] DOB: [birth year]  Today's TOC FU Call Status: Today's TOC FU Call Status:: Successful TOC FU Call Competed  Transition Care Management Follow-up Telephone Call Type of Discharge: Emergency Department Reason for ED Visit: Other: How have you been since you were released from the hospital?: Same Any questions or concerns?: No  Items Reviewed: Did you receive and understand the discharge instructions provided?: Yes Medications obtained and verified?: Yes (Medications Reviewed) Any new allergies since your discharge?: No Dietary orders reviewed?: NA Do you have support at home?: Yes People in Home: spouse  Home Care and Equipment/Supplies: Were Home Health Services Ordered?: No Any new equipment or medical supplies ordered?: No  Functional Questionnaire: Do you need assistance with bathing/showering or dressing?: No Do you need assistance with meal preparation?: No Do you need assistance with eating?: No Do you have difficulty maintaining continence: No Do you need assistance with getting out of bed/getting out of a chair/moving?: No Do you have difficulty managing or taking your medications?: No  Follow up appointments reviewed: PCP Follow-up appointment confirmed?: Yes Date of PCP follow-up appointment?: 12/24/22 Follow-up Provider: [facility] Follow-up appointment confirmed?: NA Do you need transportation to your follow-up appointment?: No Do you understand care options if your condition(s) worsen?: Yes-patient verbalized understanding    SIGNATURE Fredirick Maudlin [facility] Float Pool

## 2022-12-24 NOTE — Progress Notes (Signed)
Subjective: CC: Follow-up chronic constipation, hyperlipidemia PCP: Raliegh Ip, DO OVF:IEPPIR W Koudelka is a 47 y.o. male presenting to clinic today for:  1.  Chronic constipation Patient reports minimal improvement with 290 mcg of Linzess.  He still is only having bowel movements every few days and did not feel like he was having a total evacuation of bowels.  Decided not to pursue Cologuard and rather would like to see GI for colonoscopy.  No rectal bleeding, unplanned weight loss, night sweats  2.  Anxiety, insomnia Patient reports that he has had refractory insomnia to trazodone and primarily feels like this is due to panic attack at night.  Was not able to tolerate Chantix secondary to nightmares that were severe and he continues to smoke but less than last time we saw each other  3.  Hyperlipidemia Tolerating Crestor without difficulty.  He is fasting for labs today.  No chest pain, shortness of breath   ROS: Per HPI  No Known Allergies Past Medical History:  Diagnosis Date   GERD (gastroesophageal reflux disease)    History of stomach ulcers    Hypertension     Current Outpatient Medications:    amLODipine (NORVASC) 10 MG tablet, TAKE 1 TABLET ONCE DAILY., Disp: 30 tablet, Rfl: 2   aspirin EC 325 MG tablet, Take 325 mg by mouth daily., Disp: , Rfl:    Cholecalciferol (VITAMIN D) 125 MCG (5000 UT) CAPS, Take 5,000 Units by mouth daily., Disp: , Rfl:    docusate sodium (COLACE) 100 MG capsule, Take 100 mg by mouth daily., Disp: , Rfl:    linaclotide (LINZESS) 145 MCG CAPS capsule, Take 1 capsule (145 mcg total) by mouth daily before breakfast. For constipation, Disp: 90 capsule, Rfl: 0   methocarbamol (ROBAXIN) 500 MG tablet, Take 1 tablet (500 mg total) by mouth every 6 (six) hours as needed for muscle spasms., Disp: 30 tablet, Rfl: 0   naloxone (NARCAN) nasal spray 4 mg/0.1 mL, Place 0.4 mg into the nose once., Disp: , Rfl:    oxyCODONE-acetaminophen (PERCOCET)  7.5-325 MG tablet, Take 1 tablet by mouth every 8 (eight) hours as needed for severe pain., Disp: , Rfl:    pregabalin (LYRICA) 100 MG capsule, Take 100 mg by mouth 3 (three) times daily as needed (pain)., Disp: , Rfl:    rosuvastatin (CRESTOR) 10 MG tablet, Take 1 tablet (10 mg total) by mouth daily., Disp: 90 tablet, Rfl: 0   traZODone (DESYREL) 50 MG tablet, Take 50 mg by mouth at bedtime as needed for sleep., Disp: , Rfl:    Varenicline Tartrate, Starter, (CHANTIX STARTING MONTH PAK) 0.5 MG X 11 & 1 MG X 42 TBPK, Take 0.5 mg tablet by mouth once daily x3 days, then 0.5 mg tablet twice daily x4 days, then increase to one 1 mg tablet twice daily., Disp: 53 each, Rfl: 0   varenicline (CHANTIX CONTINUING MONTH PAK) 1 MG tablet, Take 1 tablet (1 mg total) by mouth 2 (two) times daily. (Patient not taking: Reported on 12/24/2022), Disp: 60 tablet, Rfl: 2 Social History   Socioeconomic History   Marital status: Married    Spouse name: Not on file   Number of children: 3   Years of education: Not on file   Highest education level: 11th grade  Occupational History   Not on file  Tobacco Use   Smoking status: Every Day    Packs/day: 0.50    Years: 15.00    Additional pack years: 0.00  Total pack years: 7.50    Types: Cigarettes    Start date: 07/07/1991    Last attempt to quit: 04/09/2018    Years since quitting: 4.7   Smokeless tobacco: Former    Types: Chew    Quit date: 06/09/2018  Vaping Use   Vaping Use: Never used  Substance and Sexual Activity   Alcohol use: No   Drug use: No   Sexual activity: Yes  Other Topics Concern   Not on file  Social History Narrative   Not on file   Social Determinants of Health   Financial Resource Strain: Low Risk  (12/20/2022)   Overall Financial Resource Strain (CARDIA)    Difficulty of Paying Living Expenses: Not very hard  Food Insecurity: Food Insecurity Present (12/20/2022)   Hunger Vital Sign    Worried About Running Out of Food in the  Last Year: Sometimes true    Ran Out of Food in the Last Year: Sometimes true  Transportation Needs: No Transportation Needs (12/20/2022)   PRAPARE - Administrator, Civil Service (Medical): No    Lack of Transportation (Non-Medical): No  Physical Activity: Unknown (12/20/2022)   Exercise Vital Sign    Days of Exercise per Week: 0 days    Minutes of Exercise per Session: Not on file  Stress: Stress Concern Present (12/20/2022)   [employer] of Occupational Health - Occupational Stress Questionnaire    Feeling of Stress : To some extent  Social Connections: Moderately Integrated (12/20/2022)   Social Connection and Isolation Panel [NHANES]    Frequency of Communication with Friends and Family: More than three times a week    Frequency of Social Gatherings with Friends and Family: Twice a week    Attends Religious Services: More than 4 times per year    Active Member of [employer] or Organizations: No    Attends Engineer, structural: Not on file    Marital Status: Married  Catering manager Violence: Not on file   Family History  Problem Relation Age of Onset   Diabetes Mother    Heart failure Mother    Cancer Mother    Pancreatic cancer Mother    Cancer Father    Throat cancer Father    Healthy Sister    Head & neck cancer Brother    Bone cancer Maternal Grandmother    Diabetes Maternal Grandmother    CAD Maternal Grandfather    Brain cancer Maternal Grandfather    Colon cancer Paternal Grandfather     Objective: Office vital signs reviewed. BP 133/83   Pulse 73   Temp 98.3 F (36.8 C)   Ht 5\' 11"  (1.803 m)   Wt 249 lb (112.9 kg)   SpO2 95%   BMI 34.73 kg/m   Physical Examination:  General: Awake, alert, well nourished, No acute distress HEENT: Sclera white.  Moist mucous membranes. Cardio: regular rate and rhythm, S1S2 heard, no murmurs appreciated Pulm: clear to auscultation bilaterally, no wheezes, rhonchi or rales; normal work of breathing on  room air  MSK: Antalgic gait but ambulating independently.  Normal tone.  Assessment/ Plan: 47 y.o. male   Chronic constipation - Plan: Ambulatory referral to Gastroenterology, Plecanatide (TRULANCE) 3 MG TABS  Colon cancer screening - Plan: Ambulatory referral to Gastroenterology  Mixed hyperlipidemia  Essential hypertension - Plan: amLODipine (NORVASC) 10 MG tablet  Dental infection - Plan: amoxicillin (AMOXIL) 875 MG tablet  Anxiety - Plan: mirtazapine (REMERON) 7.5 MG tablet  Psychophysiological insomnia -  Plan: mirtazapine (REMERON) 7.5 MG tablet  Continues to have uncontrolled chronic constipation is likely exacerbated by opioid use.  This is refractory to even 290 mcg of Linzess so I gave him some samples of Trulance and we will see if this is helpful.  In the interim, recommended gastroenterology evaluation as he does not have history of colonoscopy and is active smoker, putting him at increased risk of cancers.  No red flag signs or symptoms.  Referral placed  Check lipid panel, LFTs today given use of Crestor.  Will adjust dose pending response  Blood pressure controlled.  Amlodipine renewed.  Amoxicillin for dental infection.  Is setting up appointment with dentist for extraction of broken teeth  Trial of mirtazapine for sleep and anxiety/panic disorder.  Not appropriate for benzodiazepine therapy given use of opioid.  Has failed trazodone.  Could consider something like Rozerem or even a TCA if mirtazapine ineffective. Might help with GI/ Chronic pain issues  Handicap placard filled out and given to patient today   No orders of the defined types were placed in this encounter.  No orders of the defined types were placed in this encounter.    Raliegh Ip, DO [facility] [phone]

## 2022-12-25 NOTE — H&P (View-Only) (Signed)
GI Office Note    Referring Provider: Raliegh Ip, DO Primary Care Physician:  Raliegh Ip, DO  Primary Gastroenterologist: Hennie Duos. Marletta Lor, DO  Chief Complaint   Chief Complaint  Patient presents with   Colonoscopy    Has issues with chronic constipation and nausea.   History of Present Illness   Victor [name] is a 47 y.o. male presenting today at the request of Nadine Counts, Ashly M, DO for chronic constipation and need for colonoscopy.   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 Little improvement with Linzess 290 mcg daily. BM every few days. Incomplete emptying noted. Denied alarm symptoms.  PCP gave samples of Trulance yesterday.   Reports chronic constipation. Having difficulty going and hard stools. This was going on even prior to narcotics. [place] have a small BM every morning but usually every couple of days. Having left sided abdominal pain when he coughs. Hurts if he lays on his stomach.   Has been having nausea 3-4 days per week. It comes and goes. No vomiting. Sometimes affects his appetite, sometimes it does not. No unintentional weight loss.  No melena or brbpr.   Does have heartburn. Not currently tacking anything. Does have trouble swallowing about one to twice per week. Can happen with liquids, medications, and solids.   In the mid 2000s he was told he had an ulcer. Has never had an upper endoscopy.   Never had a colonoscopy.   Just went to the ED this past weekend and diagnosed with pleurisy.   Has not tried Trulance yet. Taking Colace twice daily. States he Coppin not be hydrated as much as he should.   No immediate family members with colon cancer or colon polyps.  Reports double pneumonia in 2022 and had to have chest surgery and drainage. Has rib pain - had ana injury in December.   Smokes 1/2 ppd No alcohol use.  No vaping.   Current Outpatient Medications  Medication Sig  Dispense Refill   amLODipine (NORVASC) 10 MG tablet Take 1 tablet (10 mg total) by mouth daily. 90 tablet 3   amoxicillin (AMOXIL) 875 MG tablet Take 1 tablet (875 mg total) by mouth 2 (two) times daily for 10 days. 20 tablet 0   aspirin EC 325 MG tablet Take 325 mg by mouth daily.     Cholecalciferol (VITAMIN D) 125 MCG (5000 UT) CAPS Take 5,000 Units by mouth daily.     docusate sodium (COLACE) 100 MG capsule Take 100 mg by mouth daily.     methocarbamol (ROBAXIN) 500 MG tablet Take 1 tablet (500 mg total) by mouth every 6 (six) hours as needed for muscle spasms. 30 tablet 0   mirtazapine (REMERON) 7.5 MG tablet Take 1 tablet (7.5 mg total) by mouth at bedtime. For anxiety/ sleep 90 tablet 0   naloxone (NARCAN) nasal spray 4 mg/0.1 mL Place 0.4 mg into the nose once.     oxyCODONE-acetaminophen (PERCOCET) 7.5-325 MG tablet Take 1 tablet by mouth every 8 (eight) hours as needed for severe pain.     pregabalin (LYRICA) 100 MG capsule Take 100 mg by mouth 3 (three) times daily as needed (pain).     rosuvastatin (CRESTOR) 10 MG tablet Take 1 tablet (10 mg total) by mouth daily. 90 tablet 0   Plecanatide (TRULANCE) 3 MG TABS Take 1 tablet (3 mg total) by mouth daily. (Patient not taking: Reported on  12/26/2022) 90 tablet 3   No current facility-administered medications for this visit.    Past Medical History:  Diagnosis Date   GERD (gastroesophageal reflux disease)    History of stomach ulcers    Hypertension     Past Surgical History:  Procedure Laterality Date   ANTERIOR CRUCIATE LIGAMENT REPAIR Right 05/19/2018   Procedure: RIGHT KNEE ARTHROSCOPY WITH ANTERIOR CRUCIATE LIGAMENT (ACL) REPAIR and MEDIAL MENISECTOMY;  Surgeon: Vickki Hearing, MD;  Location: [facility];  Service: Orthopedics;  Laterality: Right;   APPENDECTOMY     DECORTICATION Left 07/23/2021   Procedure: DECORTICATION of left lung;  Surgeon: Alleen Borne, MD;  Location: [facility] OR;  Service: Thoracic;  Laterality: Left;    HARDWARE REMOVAL Right 09/21/2019   Procedure: HARDWARE REMOVAL (RICHARD'S STAPLE) RIGHT KNEE;  Surgeon: Vickki Hearing, MD;  Location: [facility];  Service: Orthopedics;  Laterality: Right;   IR PERC PLEURAL DRAIN W/INDWELL CATH W/IMG GUIDE  07/19/2021   KNEE ARTHROSCOPY WITH MEDIAL MENISECTOMY Right 05/29/2017   Procedure: KNEE ARTHROSCOPY WITH MEDIAL MENISECTOMY and lateral meniscectomy;  Surgeon: Vickki Hearing, MD;  Location: [facility];  Service: Orthopedics;  Laterality: Right;   KNEE ARTHROSCOPY WITH MEDIAL MENISECTOMY Right 02/05/2018   Procedure: RIGHT KNEE ARTHROSCOPY WITH PARTIAL MEDIAL AND LATERAL MENISECTOMY;  Surgeon: Vickki Hearing, MD;  Location: [facility];  Service: Orthopedics;  Laterality: Right;   KNEE ARTHROSCOPY WITH MEDIAL MENISECTOMY Left 04/23/2022   Procedure: KNEE ARTHROSCOPY WITH MEDIAL MENISCECTOMY;  Surgeon: Vickki Hearing, MD;  Location: [facility];  Service: Orthopedics;  Laterality: Left;   THORACOTOMY/LOBECTOMY Left 07/23/2021   Procedure: THORACOTOMY with drainage of empyema;  Surgeon: Alleen Borne, MD;  Location: [facility] OR;  Service: Thoracic;  Laterality: Left;   TOTAL KNEE ARTHROPLASTY Right 03/28/2020   Procedure: TOTAL KNEE ARTHROPLASTY;  Surgeon: Vickki Hearing, MD;  Location: [facility];  Service: Orthopedics;  Laterality: Right;   TOTAL KNEE REVISION Right 08/08/2020   Procedure: REVISION OF RIGHT TOTAL KNEE  TIBIAL COMPONENTS ONLY;  Surgeon: Vickki Hearing, MD;  Location: [facility];  Service: Orthopedics;  Laterality: Right;    Family History  Problem Relation Age of Onset   Diabetes Mother    Heart failure Mother    Cancer Mother    Pancreatic cancer Mother    Cancer Father    Throat cancer Father    Healthy Sister    Head & neck cancer Brother    Bone cancer Maternal Grandmother    Diabetes Maternal Grandmother    CAD Maternal Grandfather    Brain cancer Maternal Grandfather    Colon cancer Paternal Grandfather     Allergies as of  12/26/2022   (No Known Allergies)    Social History   Socioeconomic History   Marital status: Married    Spouse name: Not on file   Number of children: 3   Years of education: Not on file   Highest education level: 11th grade  Occupational History   Not on file  Tobacco Use   Smoking status: Every Day    Packs/day: 0.50    Years: 15.00    Additional pack years: 0.00    Total pack years: 7.50    Types: Cigarettes    Start date: 07/07/1991    Last attempt to quit: 04/09/2018    Years since quitting: 4.7   Smokeless tobacco: Former    Types: Chew    Quit date: 06/09/2018  Vaping Use   Vaping  Use: Never used  Substance and Sexual Activity   Alcohol use: No   Drug use: No   Sexual activity: Yes  Other Topics Concern   Not on file  Social History Narrative   Not on file   Social Determinants of Health   Financial Resource Strain: Low Risk  (12/20/2022)   Overall Financial Resource Strain (CARDIA)    Difficulty of Paying Living Expenses: Not very hard  Food Insecurity: Food Insecurity Present (12/20/2022)   Hunger Vital Sign    Worried About Running Out of Food in the Last Year: Sometimes true    Ran Out of Food in the Last Year: Sometimes true  Transportation Needs: No Transportation Needs (12/20/2022)   PRAPARE - Administrator, Civil Service (Medical): No    Lack of Transportation (Non-Medical): No  Physical Activity: Unknown (12/20/2022)   Exercise Vital Sign    Days of Exercise per Week: 0 days    Minutes of Exercise per Session: Not on file  Stress: Stress Concern Present (12/20/2022)   [employer] of Occupational Health - Occupational Stress Questionnaire    Feeling of Stress : To some extent  Social Connections: Moderately Integrated (12/20/2022)   Social Connection and Isolation Panel [NHANES]    Frequency of Communication with Friends and Family: More than three times a week    Frequency of Social Gatherings with Friends and Family: Twice a  week    Attends Religious Services: More than 4 times per year    Active Member of [employer] or Organizations: No    Attends Engineer, structural: Not on file    Marital Status: Married  Catering manager Violence: Not on file     Review of Systems   Gen: Denies any fever, chills, fatigue, weight loss, lack of appetite.  CV: Denies chest pain, heart palpitations, peripheral edema, syncope.  Resp: Denies shortness of breath at rest or with exertion. Denies wheezing or cough.  GI: see HPI GU : Denies urinary burning, urinary frequency, urinary hesitancy MS: Denies joint pain, muscle weakness, cramps, or limitation of movement.  Derm: Denies rash, itching, dry skin Psych: Denies depression, anxiety, memory loss, and confusion Heme: Denies bruising, bleeding, and enlarged lymph nodes.   Physical Exam   BP 127/86 (BP Location: Right Arm, Patient Position: Sitting, Cuff Size: Large)   Pulse 60   Temp 97.7 F (36.5 C) (Oral)   Ht 5\' 11"  (1.803 m)   Wt 249 lb 12.8 oz (113.3 kg)   SpO2 96%   BMI 34.84 kg/m   General:   Alert and oriented. Pleasant and cooperative. Well-nourished and well-developed.  Head:  Normocephalic and atraumatic. Eyes:  Without icterus, sclera clear and conjunctiva pink.  Ears:  Normal auditory acuity. Mouth:  No deformity or lesions, oral mucosa pink.  Lungs:  Clear to auscultation bilaterally. No wheezes, rales, or rhonchi. No distress.  Heart:  S1, S2 present without murmurs appreciated.  Abdomen:  +BS, soft,  non-distended.  Mild TTP to epigastrium.  TTP to left lower quadrant and right lower quadrant.  No HSM noted. No guarding or rebound. No masses appreciated.  Rectal:  Deferred  Msk:  Symmetrical without gross deformities. Normal posture. Extremities:  Without edema. Neurologic:  Alert and  oriented x4;  grossly normal neurologically. Skin:  Intact without significant lesions or rashes. Psych:  Alert and cooperative. Normal mood and  affect.   Assessment   Victor [name] is a 47 y.o. male with a history  of chronic joint pain s/p multiple bilateral knee surgeries and replacements on chronic pain medication, GERD, HTN, and remote history of stomach ulcer (no prior history of EGD) presenting today with need to schedule screening colonoscopy and evaluation/management of constipation and nausea.  Screening for colon cancer: Reports a history of colon cancer in his stepfather however this is not blood relation to him.  Does report a family history of pancreatic and lung cancer but no known colorectal cancer.  Denies any melena, BRBPR, unintentional weight loss.  He does occasionally have some nausea without vomiting which can affect his appetite.  Does have constipation as noted below.  We will proceed with first-ever screening colonoscopy.  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 Recently given trial of Trulance which he has not yet started.  Reports pharmacy had this on backorder.  Noted a prior history of constipation even before beginning opioids.  Is suffering from incomplete emptying, difficulty with defecation, and hard stools.  Currently taking Colace twice daily.  Currently having a very small bowel movement every morning but usually would not have a BM for every couple days.  Having left-sided abdominal pain as well.  Advised to start his trial of Trulance and take for 2 weeks, if no improvement we will consider trial of Amitiza 24 mcg twice daily.  Given instructions to assist with bowel function today including taking bisacodyl along with a stool softener for the next 2 days and then beginning Trulance.  GERD, Nausea, dysphagia: Has nausea about 3-4 days a week, is intermittent and comes and goes without rhyme or reason.  At times this does affect his appetite.  Does note some issues with acid reflux intermittently as well and was told in the mid 2000's that he had a stomach ulcer.  No prior history of endoscopy.   Also has reported some intermittent issues with dysphagia, occurring with pills, liquids, and solids at times.  Does admit to tobacco use.  Denies any alcohol use.  Has history of double pneumonia requiring chest surgery in chest tubes in the past.  Given his GERD and dysphagia we will proceed with upper endoscopy at time of colonoscopy.  Will also start on daily PPI.  GERD diet advised.  Suspect that some of his nausea Iribe be secondary to his constipation.  PLAN   Continue trial of Trulance, samples provided.  Take Colace twice daily. Will trial Amitiza BID if no improvement with trulance.  Proceed with upper endoscopy with possible dilation and colonoscopy with propofol by Dr. Marletta Lor in near future: the risks, benefits, and alternatives have been discussed with the patient in detail. The patient states understanding and desires to proceed. ASA 2 Extra 1/2 day clears Continue trulance of other laxative  Trilyte prep Pantoprazole 40 mg once daily.  GERD diet Follow up in 8-10 weeks.    Brooke Bonito, MSN, FNP-BC, AGACNP-BC [facility]

## 2022-12-25 NOTE — Progress Notes (Unsigned)
GI Office Note    Referring Provider: Raliegh Ip, DO Primary Care Physician:  Raliegh Ip, DO  Primary Gastroenterologist: Hennie Duos. Marletta Lor, DO  Chief Complaint   Chief Complaint  Patient presents with   Colonoscopy    Has issues with chronic constipation and nausea.   History of Present Illness   Sears Oran Broccoli is a 47 y.o. male presenting today at the request of Nadine Counts, Ashly M, DO for chronic constipation and need for colonoscopy.   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 Little improvement with Linzess 290 mcg daily. BM every few days. Incomplete emptying noted. Denied alarm symptoms.  PCP gave samples of Trulance yesterday.   Reports chronic constipation. Having difficulty going and hard stools. This was going on even prior to narcotics. [place] have a small BM every morning but usually every couple of days. Having left sided abdominal pain when he coughs. Hurts if he lays on his stomach.   Has been having nausea 3-4 days per week. It comes and goes. No vomiting. Sometimes affects his appetite, sometimes it does not. No unintentional weight loss.  No melena or brbpr.   Does have heartburn. Not currently tacking anything. Does have trouble swallowing about one to twice per week. Can happen with liquids, medications, and solids.   In the mid 2000s he was told he had an ulcer. Has never had an upper endoscopy.   Never had a colonoscopy.   Just went to the ED this past weekend and diagnosed with pleurisy.   Has not tried Trulance yet. Taking Colace twice daily. States he [place] not be hydrated as much as he should.   No immediate family members with colon cancer or colon polyps.  Reports double pneumonia in 2022 and had to have chest surgery and drainage. Has rib pain - had ana injury in December.   Smokes 1/2 ppd No alcohol use.  No vaping.   Current Outpatient Medications  Medication Sig  Dispense Refill   amLODipine (NORVASC) 10 MG tablet Take 1 tablet (10 mg total) by mouth daily. 90 tablet 3   amoxicillin (AMOXIL) 875 MG tablet Take 1 tablet (875 mg total) by mouth 2 (two) times daily for 10 days. 20 tablet 0   aspirin EC 325 MG tablet Take 325 mg by mouth daily.     Cholecalciferol (VITAMIN D) 125 MCG (5000 UT) CAPS Take 5,000 Units by mouth daily.     docusate sodium (COLACE) 100 MG capsule Take 100 mg by mouth daily.     methocarbamol (ROBAXIN) 500 MG tablet Take 1 tablet (500 mg total) by mouth every 6 (six) hours as needed for muscle spasms. 30 tablet 0   mirtazapine (REMERON) 7.5 MG tablet Take 1 tablet (7.5 mg total) by mouth at bedtime. For anxiety/ sleep 90 tablet 0   naloxone (NARCAN) nasal spray 4 mg/0.1 mL Place 0.4 mg into the nose once.     oxyCODONE-acetaminophen (PERCOCET) 7.5-325 MG tablet Take 1 tablet by mouth every 8 (eight) hours as needed for severe pain.     pregabalin (LYRICA) 100 MG capsule Take 100 mg by mouth 3 (three) times daily as needed (pain).     rosuvastatin (CRESTOR) 10 MG tablet Take 1 tablet (10 mg total) by mouth daily. 90 tablet 0   Plecanatide (TRULANCE) 3 MG TABS Take 1 tablet (3 mg total) by mouth daily. (Patient not taking: Reported on  12/26/2022) 90 tablet 3   No current facility-administered medications for this visit.    Past Medical History:  Diagnosis Date   GERD (gastroesophageal reflux disease)    History of stomach ulcers    Hypertension     Past Surgical History:  Procedure Laterality Date   ANTERIOR CRUCIATE LIGAMENT REPAIR Right 05/19/2018   Procedure: RIGHT KNEE ARTHROSCOPY WITH ANTERIOR CRUCIATE LIGAMENT (ACL) REPAIR and MEDIAL MENISECTOMY;  Surgeon: Vickki Hearing, MD;  Location: [facility];  Service: Orthopedics;  Laterality: Right;   APPENDECTOMY     DECORTICATION Left 07/23/2021   Procedure: DECORTICATION of left lung;  Surgeon: Alleen Borne, MD;  Location: [facility];  Service: Thoracic;  Laterality: Left;    HARDWARE REMOVAL Right 09/21/2019   Procedure: HARDWARE REMOVAL (RICHARD'S STAPLE) RIGHT KNEE;  Surgeon: Vickki Hearing, MD;  Location: [facility];  Service: Orthopedics;  Laterality: Right;   IR PERC PLEURAL DRAIN W/INDWELL CATH W/IMG GUIDE  07/19/2021   KNEE ARTHROSCOPY WITH MEDIAL MENISECTOMY Right 05/29/2017   Procedure: KNEE ARTHROSCOPY WITH MEDIAL MENISECTOMY and lateral meniscectomy;  Surgeon: Vickki Hearing, MD;  Location: [facility];  Service: Orthopedics;  Laterality: Right;   KNEE ARTHROSCOPY WITH MEDIAL MENISECTOMY Right 02/05/2018   Procedure: RIGHT KNEE ARTHROSCOPY WITH PARTIAL MEDIAL AND LATERAL MENISECTOMY;  Surgeon: Vickki Hearing, MD;  Location: [facility];  Service: Orthopedics;  Laterality: Right;   KNEE ARTHROSCOPY WITH MEDIAL MENISECTOMY Left 04/23/2022   Procedure: KNEE ARTHROSCOPY WITH MEDIAL MENISCECTOMY;  Surgeon: Vickki Hearing, MD;  Location: [facility];  Service: Orthopedics;  Laterality: Left;   THORACOTOMY/LOBECTOMY Left 07/23/2021   Procedure: THORACOTOMY with drainage of empyema;  Surgeon: Alleen Borne, MD;  Location: [facility] OR;  Service: Thoracic;  Laterality: Left;   TOTAL KNEE ARTHROPLASTY Right 03/28/2020   Procedure: TOTAL KNEE ARTHROPLASTY;  Surgeon: Vickki Hearing, MD;  Location: [facility];  Service: Orthopedics;  Laterality: Right;   TOTAL KNEE REVISION Right 08/08/2020   Procedure: REVISION OF RIGHT TOTAL KNEE  TIBIAL COMPONENTS ONLY;  Surgeon: Vickki Hearing, MD;  Location: [facility];  Service: Orthopedics;  Laterality: Right;    Family History  Problem Relation Age of Onset   Diabetes Mother    Heart failure Mother    Cancer Mother    Pancreatic cancer Mother    Cancer Father    Throat cancer Father    Healthy Sister    Head & neck cancer Brother    Bone cancer Maternal Grandmother    Diabetes Maternal Grandmother    CAD Maternal Grandfather    Brain cancer Maternal Grandfather    Colon cancer Paternal Grandfather     Allergies as of  12/26/2022   (No Known Allergies)    Social History   Socioeconomic History   Marital status: Married    Spouse name: Not on file   Number of children: 3   Years of education: Not on file   Highest education level: 11th grade  Occupational History   Not on file  Tobacco Use   Smoking status: Every Day    Packs/day: 0.50    Years: 15.00    Additional pack years: 0.00    Total pack years: 7.50    Types: Cigarettes    Start date: 07/07/1991    Last attempt to quit: 04/09/2018    Years since quitting: 4.7   Smokeless tobacco: Former    Types: Chew    Quit date: 06/09/2018  Vaping Use   Vaping  Use: Never used  Substance and Sexual Activity   Alcohol use: No   Drug use: No   Sexual activity: Yes  Other Topics Concern   Not on file  Social History Narrative   Not on file   Social Determinants of Health   Financial Resource Strain: Low Risk  (12/20/2022)   Overall Financial Resource Strain (CARDIA)    Difficulty of Paying Living Expenses: Not very hard  Food Insecurity: Food Insecurity Present (12/20/2022)   Hunger Vital Sign    Worried About Running Out of Food in the Last Year: Sometimes true    Ran Out of Food in the Last Year: Sometimes true  Transportation Needs: No Transportation Needs (12/20/2022)   PRAPARE - Administrator, Civil Service (Medical): No    Lack of Transportation (Non-Medical): No  Physical Activity: Unknown (12/20/2022)   Exercise Vital Sign    Days of Exercise per Week: 0 days    Minutes of Exercise per Session: Not on file  Stress: Stress Concern Present (12/20/2022)   [employer] of Occupational Health - Occupational Stress Questionnaire    Feeling of Stress : To some extent  Social Connections: Moderately Integrated (12/20/2022)   Social Connection and Isolation Panel [NHANES]    Frequency of Communication with Friends and Family: More than three times a week    Frequency of Social Gatherings with Friends and Family: Twice a  week    Attends Religious Services: More than 4 times per year    Active Member of [employer] or Organizations: No    Attends Engineer, structural: Not on file    Marital Status: Married  Catering manager Violence: Not on file     Review of Systems   Gen: Denies any fever, chills, fatigue, weight loss, lack of appetite.  CV: Denies chest pain, heart palpitations, peripheral edema, syncope.  Resp: Denies shortness of breath at rest or with exertion. Denies wheezing or cough.  GI: see HPI GU : Denies urinary burning, urinary frequency, urinary hesitancy MS: Denies joint pain, muscle weakness, cramps, or limitation of movement.  Derm: Denies rash, itching, dry skin Psych: Denies depression, anxiety, memory loss, and confusion Heme: Denies bruising, bleeding, and enlarged lymph nodes.   Physical Exam   BP 127/86 (BP Location: Right Arm, Patient Position: Sitting, Cuff Size: Large)   Pulse 60   Temp 97.7 F (36.5 C) (Oral)   Ht 5\' 11"  (1.803 m)   Wt 249 lb 12.8 oz (113.3 kg)   SpO2 96%   BMI 34.84 kg/m   General:   Alert and oriented. Pleasant and cooperative. Well-nourished and well-developed.  Head:  Normocephalic and atraumatic. Eyes:  Without icterus, sclera clear and conjunctiva pink.  Ears:  Normal auditory acuity. Mouth:  No deformity or lesions, oral mucosa pink.  Lungs:  Clear to auscultation bilaterally. No wheezes, rales, or rhonchi. No distress.  Heart:  S1, S2 present without murmurs appreciated.  Abdomen:  +BS, soft,  non-distended.  Mild TTP to epigastrium.  TTP to left lower quadrant and right lower quadrant.  No HSM noted. No guarding or rebound. No masses appreciated.  Rectal:  Deferred  Msk:  Symmetrical without gross deformities. Normal posture. Extremities:  Without edema. Neurologic:  Alert and  oriented x4;  grossly normal neurologically. Skin:  Intact without significant lesions or rashes. Psych:  Alert and cooperative. Normal mood and  affect.   Assessment   Victor [name] is a 47 y.o. male with a history  of chronic joint pain s/p multiple bilateral knee surgeries and replacements on chronic pain medication, GERD, HTN, and remote history of stomach ulcer (no prior history of EGD) presenting today with need to schedule screening colonoscopy and evaluation/management of constipation and nausea.  Screening for colon cancer: Reports a history of colon cancer in his stepfather however this is not blood relation to him.  Does report a family history of pancreatic and lung cancer but no known colorectal cancer.  Denies any melena, BRBPR, unintentional weight loss.  He does occasionally have some nausea without vomiting which can affect his appetite.  Does have constipation as noted below.  We will proceed with first-ever screening colonoscopy.  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 Recently given trial of Trulance which he has not yet started.  Reports pharmacy had this on backorder.  Noted a prior history of constipation even before beginning opioids.  Is suffering from incomplete emptying, difficulty with defecation, and hard stools.  Currently taking Colace twice daily.  Currently having a very small bowel movement every morning but usually would not have a BM for every couple days.  Having left-sided abdominal pain as well.  Advised to start his trial of Trulance and take for 2 weeks, if no improvement we will consider trial of Amitiza 24 mcg twice daily.  Given instructions to assist with bowel function today including taking bisacodyl along with a stool softener for the next 2 days and then beginning Trulance.  GERD, Nausea, dysphagia: Has nausea about 3-4 days a week, is intermittent and comes and goes without rhyme or reason.  At times this does affect his appetite.  Does note some issues with acid reflux intermittently as well and was told in the mid 2000's that he had a stomach ulcer.  No prior history of endoscopy.   Also has reported some intermittent issues with dysphagia, occurring with pills, liquids, and solids at times.  Does admit to tobacco use.  Denies any alcohol use.  Has history of double pneumonia requiring chest surgery in chest tubes in the past.  Given his GERD and dysphagia we will proceed with upper endoscopy at time of colonoscopy.  Will also start on daily PPI.  GERD diet advised.  Suspect that some of his nausea Brusca be secondary to his constipation.  PLAN   Continue trial of Trulance, samples provided.  Take Colace twice daily. Will trial Amitiza BID if no improvement with trulance.  Proceed with upper endoscopy with possible dilation and colonoscopy with propofol by Dr. Marletta Lor in near future: the risks, benefits, and alternatives have been discussed with the patient in detail. The patient states understanding and desires to proceed. ASA 2 Extra 1/2 day clears Continue trulance of other laxative  Trilyte prep Pantoprazole 40 mg once daily.  GERD diet Follow up in 8-10 weeks.    Brooke Bonito, MSN, FNP-BC, AGACNP-BC [facility]

## 2022-12-26 ENCOUNTER — Encounter: Payer: Self-pay | Admitting: *Deleted

## 2022-12-26 ENCOUNTER — Encounter: Payer: Self-pay | Admitting: Gastroenterology

## 2022-12-26 ENCOUNTER — Ambulatory Visit (INDEPENDENT_AMBULATORY_CARE_PROVIDER_SITE_OTHER): Payer: Medicare Other | Admitting: Gastroenterology

## 2022-12-26 ENCOUNTER — Other Ambulatory Visit: Payer: Self-pay | Admitting: *Deleted

## 2022-12-26 VITALS — BP 127/86 | HR 60 | Temp 97.7°F | Ht 71.0 in | Wt 249.8 lb

## 2022-12-26 DIAGNOSIS — K219 Gastro-esophageal reflux disease without esophagitis: Secondary | ICD-10-CM | POA: Diagnosis not present

## 2022-12-26 DIAGNOSIS — Z1211 Encounter for screening for malignant neoplasm of colon: Secondary | ICD-10-CM | POA: Diagnosis not present

## 2022-12-26 DIAGNOSIS — K5903 Drug induced constipation: Secondary | ICD-10-CM | POA: Diagnosis not present

## 2022-12-26 DIAGNOSIS — R110 Nausea: Secondary | ICD-10-CM | POA: Diagnosis not present

## 2022-12-26 DIAGNOSIS — R1310 Dysphagia, unspecified: Secondary | ICD-10-CM

## 2022-12-26 MED ORDER — PANTOPRAZOLE SODIUM 40 MG PO TBEC: 40.0000 mg | DELAYED_RELEASE_TABLET | Freq: Every day | ORAL | 3 refills | Status: DC

## 2022-12-26 MED ORDER — PEG 3350-KCL-NA BICARB-NACL 420 G PO SOLR: 4000.0000 mL | Freq: Once | ORAL | 0 refills | Status: AC

## 2022-12-26 NOTE — Patient Instructions (Addendum)
We are scheduling you for an upper endoscopy with possible dilation and colonoscopy in the near future with Dr. Marletta Lor.  Start taking Trulance and monitor for improvement of constipation.  You Horen continue to take Colace twice daily. If you would like you Letizia take 2 dulcolax (bisacodyl) tablets today and if not having a good bowel movement then repeat tomorrow.  Trial this Trulance for 2 weeks and if no improvement we will trial Amitiza 24 mcg BID.   Start taking pantoprazole 40 mg once daily, 30 minutes prior to breakfast.  Follow a GERD diet:  Avoid fried, fatty, greasy, spicy, citrus foods. Avoid caffeine and carbonated beverages. Avoid chocolate. Try eating 4-6 small meals a day rather than 3 large meals. Do not eat within 3 hours of laying down. Prop head of bed up on wood or bricks to create a 6 inch incline.  We will follow-up after your procedures, likely 8-10 weeks from now.  Please not hesitate to reach out in the meantime if you have any questions, concerns, or worsening symptoms.  It was a pleasure to see you today. I want to create trusting relationships with patients. If you receive a survey regarding your visit,  I greatly appreciate you taking time to fill this out on paper or through your MyChart. I value your feedback.  Brooke Bonito, MSN, FNP-BC, AGACNP-BC [facility]

## 2022-12-30 ENCOUNTER — Encounter: Payer: Self-pay | Admitting: Gastroenterology

## 2023-01-06 ENCOUNTER — Other Ambulatory Visit: Payer: Self-pay

## 2023-01-06 ENCOUNTER — Telehealth: Payer: Self-pay

## 2023-01-06 NOTE — Telephone Encounter (Signed)
Error

## 2023-01-16 ENCOUNTER — Other Ambulatory Visit: Payer: Self-pay

## 2023-01-16 ENCOUNTER — Encounter (HOSPITAL_COMMUNITY)
Admission: RE | Admit: 2023-01-16 | Discharge: 2023-01-16 | Disposition: A | Discharge status: Home / Self Care | Payer: Medicare Other | Source: Ambulatory Visit | Attending: Internal Medicine | Admitting: Internal Medicine

## 2023-01-16 ENCOUNTER — Telehealth: Payer: Self-pay | Admitting: *Deleted

## 2023-01-16 ENCOUNTER — Encounter (HOSPITAL_COMMUNITY): Payer: Self-pay

## 2023-01-16 NOTE — Telephone Encounter (Signed)
Pt called and wanted to know if prescription for Trulance or Linzess would be sent to pharmacy for his upcoming colonoscopy.  Spoke with provider and she says if we have some samples of trulance and they worked well for him then we can give him some of those instead of the Linzess. Pt states the Trulance works better. He will send his wife Annabelle Harman to pick up the samples. Placed samples up front.

## 2023-01-19 IMAGING — CR DG CHEST 2V
2 series · 2 of 2 positions shown · non-contrast
Comparison: 07/27/2021

CLINICAL DATA: Chest tube removal.

EXAM:
CHEST - 2 VIEW

[chest pa]
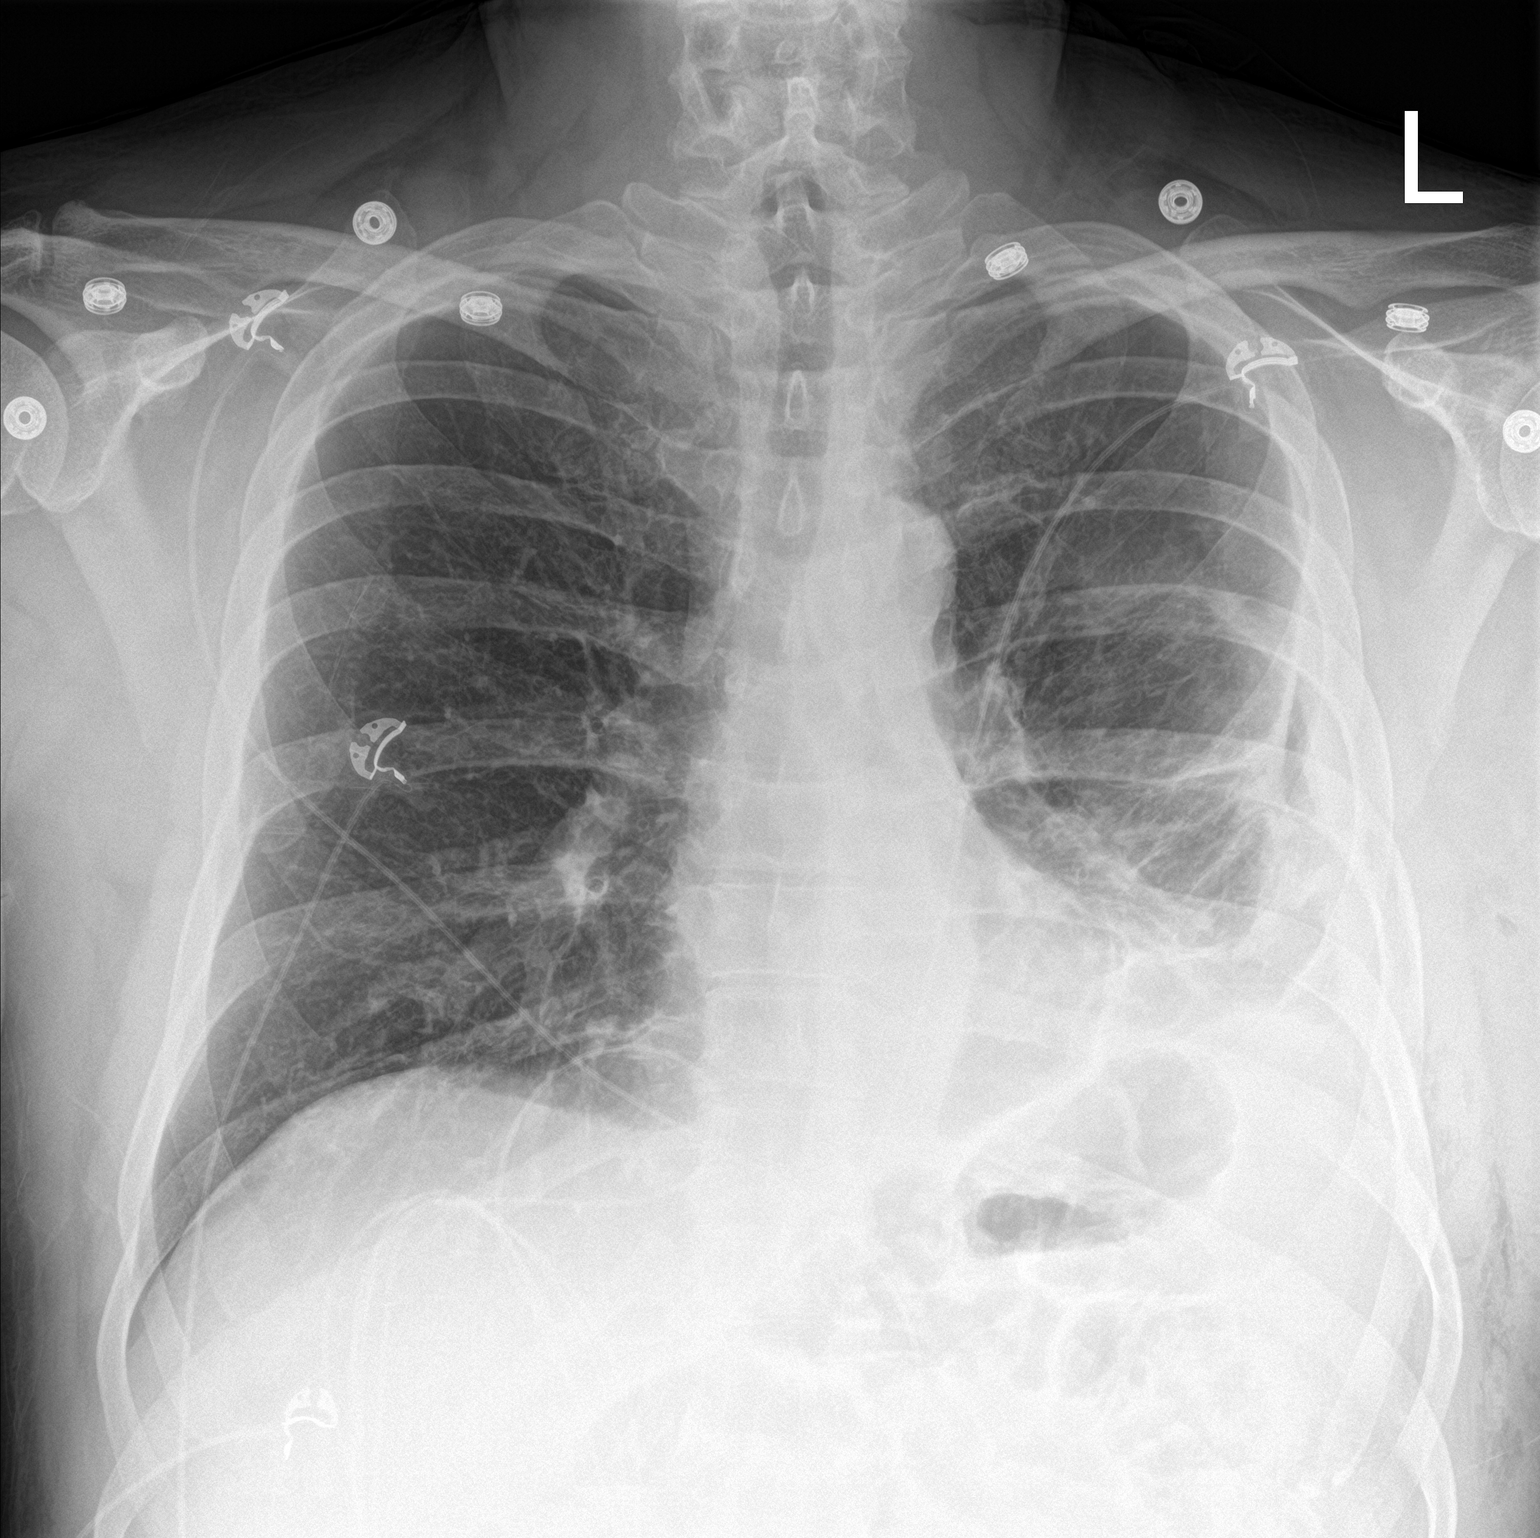

[chest lat]
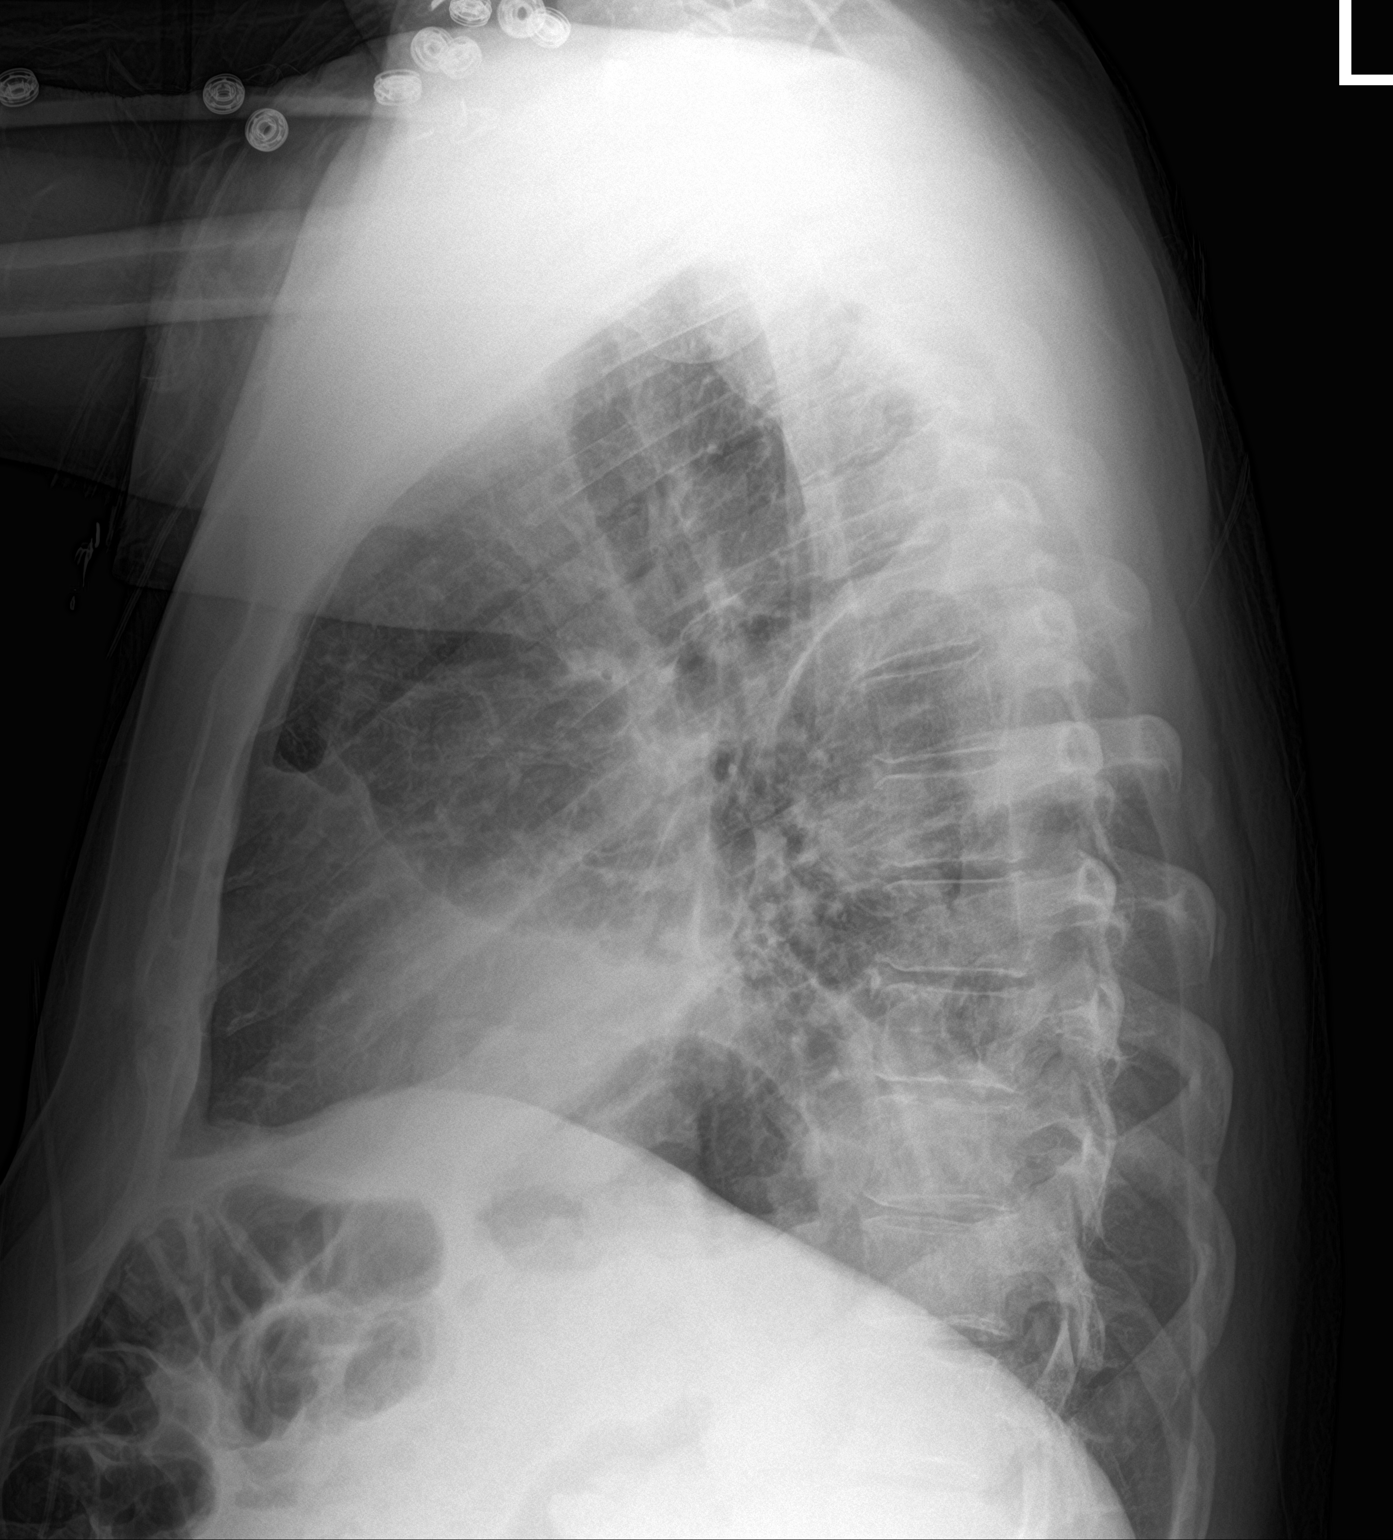

[2 of 2 positions shown; findings below may reference images not displayed]

FINDINGS: 6546 hours. Lateral left pneumothorax is similar to prior with left
base collapse/consolidative opacity and minimal left pleural
thickening/fluid. Right lung remains clear. The cardiopericardial
silhouette is within normal limits for size. The visualized bony
structures of the thorax show no acute abnormality. Telemetry leads
overlie the chest.
IMPRESSION: Stable exam. Lateral left pneumothorax with left base
collapse/consolidative opacity and minimal left pleural
thickening/fluid.

## 2023-01-22 ENCOUNTER — Other Ambulatory Visit: Payer: Self-pay

## 2023-01-22 ENCOUNTER — Encounter (HOSPITAL_COMMUNITY)
Admission: RE | Disposition: A | Discharge status: Home / Self Care | Payer: Self-pay | Source: Home / Self Care | Attending: Internal Medicine

## 2023-01-22 ENCOUNTER — Ambulatory Visit (HOSPITAL_COMMUNITY)
Admission: RE | Admit: 2023-01-22 | Discharge: 2023-01-22 | Disposition: A | Discharge status: Home / Self Care | Payer: Medicare Other | Attending: Internal Medicine | Admitting: Internal Medicine

## 2023-01-22 ENCOUNTER — Ambulatory Visit (HOSPITAL_COMMUNITY): Payer: Medicare Other | Admitting: Anesthesiology

## 2023-01-22 ENCOUNTER — Ambulatory Visit (HOSPITAL_BASED_OUTPATIENT_CLINIC_OR_DEPARTMENT_OTHER): Payer: Medicare Other | Admitting: Anesthesiology

## 2023-01-22 ENCOUNTER — Encounter (HOSPITAL_COMMUNITY): Payer: Self-pay

## 2023-01-22 DIAGNOSIS — K5909 Other constipation: Secondary | ICD-10-CM | POA: Diagnosis not present

## 2023-01-22 DIAGNOSIS — K5903 Drug induced constipation: Secondary | ICD-10-CM | POA: Insufficient documentation

## 2023-01-22 DIAGNOSIS — I10 Essential (primary) hypertension: Secondary | ICD-10-CM | POA: Diagnosis not present

## 2023-01-22 DIAGNOSIS — K2289 Other specified disease of esophagus: Secondary | ICD-10-CM | POA: Diagnosis not present

## 2023-01-22 DIAGNOSIS — K635 Polyp of colon: Secondary | ICD-10-CM | POA: Diagnosis not present

## 2023-01-22 DIAGNOSIS — Z1211 Encounter for screening for malignant neoplasm of colon: Secondary | ICD-10-CM

## 2023-01-22 DIAGNOSIS — K219 Gastro-esophageal reflux disease without esophagitis: Secondary | ICD-10-CM | POA: Diagnosis not present

## 2023-01-22 DIAGNOSIS — K2950 Unspecified chronic gastritis without bleeding: Secondary | ICD-10-CM | POA: Insufficient documentation

## 2023-01-22 DIAGNOSIS — K2980 Duodenitis without bleeding: Secondary | ICD-10-CM | POA: Insufficient documentation

## 2023-01-22 DIAGNOSIS — Z87891 Personal history of nicotine dependence: Secondary | ICD-10-CM

## 2023-01-22 DIAGNOSIS — R1310 Dysphagia, unspecified: Secondary | ICD-10-CM | POA: Insufficient documentation

## 2023-01-22 DIAGNOSIS — K259 Gastric ulcer, unspecified as acute or chronic, without hemorrhage or perforation: Secondary | ICD-10-CM | POA: Insufficient documentation

## 2023-01-22 DIAGNOSIS — D124 Benign neoplasm of descending colon: Secondary | ICD-10-CM | POA: Diagnosis not present

## 2023-01-22 DIAGNOSIS — K2990 Gastroduodenitis, unspecified, without bleeding: Secondary | ICD-10-CM

## 2023-01-22 DIAGNOSIS — K31A11 Gastric intestinal metaplasia without dysplasia, involving the antrum: Secondary | ICD-10-CM | POA: Diagnosis not present

## 2023-01-22 DIAGNOSIS — T402X5A Adverse effect of other opioids, initial encounter: Secondary | ICD-10-CM | POA: Insufficient documentation

## 2023-01-22 HISTORY — PX: ESOPHAGOGASTRODUODENOSCOPY (EGD) WITH PROPOFOL: SHX5813

## 2023-01-22 HISTORY — PX: BIOPSY: SHX5522

## 2023-01-22 HISTORY — PX: COLONOSCOPY WITH PROPOFOL: SHX5780

## 2023-01-22 HISTORY — PX: POLYPECTOMY: SHX5525

## 2023-01-22 SURGERY — COLONOSCOPY WITH PROPOFOL
Anesthesia: General

## 2023-01-22 MED ORDER — PROPOFOL 500 MG/50ML IV EMUL
INTRAVENOUS | Status: AC
  Filled 2023-01-22: qty 50

## 2023-01-22 MED ORDER — LIDOCAINE HCL (PF) 2 % IJ SOLN
INTRAMUSCULAR | Status: AC
  Filled 2023-01-22: qty 5

## 2023-01-22 MED ORDER — LACTATED RINGERS IV SOLN: INTRAVENOUS | Status: DC | PRN

## 2023-01-22 MED ORDER — LIDOCAINE 2% (20 MG/ML) 5 ML SYRINGE
INTRAMUSCULAR | Status: DC | PRN
  Administered 2023-01-22: 100 mg via INTRAVENOUS

## 2023-01-22 MED ORDER — PROPOFOL 500 MG/50ML IV EMUL
INTRAVENOUS | Status: DC | PRN
  Administered 2023-01-22: 100 ug/kg/min via INTRAVENOUS
  Administered 2023-01-22: 150 mg via INTRAVENOUS

## 2023-01-22 MED ORDER — PROPOFOL 1000 MG/100ML IV EMUL
INTRAVENOUS | Status: AC
  Filled 2023-01-22: qty 100

## 2023-01-22 MED ORDER — PANTOPRAZOLE SODIUM 40 MG PO TBEC: 40.0000 mg | DELAYED_RELEASE_TABLET | Freq: Two times a day (BID) | ORAL | 11 refills | Status: AC

## 2023-01-22 NOTE — Transfer of Care (Signed)
Immediate Anesthesia Transfer of Care Note  Patient: [name]  Procedure(s) Performed: COLONOSCOPY WITH PROPOFOL ESOPHAGOGASTRODUODENOSCOPY (EGD) WITH PROPOFOL BIOPSY POLYPECTOMY  Patient Location: Endoscopy Unit  Anesthesia Type:General  Level of Consciousness: drowsy  Airway & Oxygen Therapy: Patient Spontanous Breathing and Patient connected to nasal cannula oxygen  Post-op Assessment: Report given to RN and Post -op Vital signs reviewed and stable  Post vital signs: Reviewed and stable  Last Vitals:  Vitals Value Taken Time  BP 112/68 01/22/23 1330  Temp 36.4 C 01/22/23 1330  Pulse 75 01/22/23 1330  Resp 25 01/22/23 1330  SpO2 99 % 01/22/23 1330    Last Pain:  Vitals:   01/22/23 1330  TempSrc: Oral  PainSc: Asleep      Patients Stated Pain Goal: 5 (01/22/23 1102)  Complications: No notable events documented.

## 2023-01-22 NOTE — Discharge Instructions (Addendum)
EGD Discharge instructions Please read the instructions outlined below and refer to this sheet in the next few weeks. These discharge instructions provide you with general information on caring for yourself after you leave the hospital. Your doctor Kwolek also give you specific instructions. While your treatment has been planned according to the most current medical practices available, unavoidable complications occasionally occur. If you have any problems or questions after discharge, please call your doctor. ACTIVITY You Sohn resume your regular activity but move at a slower pace for the next 24 hours.  Take frequent rest periods for the next 24 hours.  Walking will help expel (get rid of) the air and reduce the bloated feeling in your abdomen.  No driving for 24 hours (because of the anesthesia (medicine) used during the test).  You Vaeth shower.  Do not sign any important legal documents or operate any machinery for 24 hours (because of the anesthesia used during the test).  NUTRITION Drink plenty of fluids.  You Delima resume your normal diet.  Begin with a light meal and progress to your normal diet.  Avoid alcoholic beverages for 24 hours or as instructed by your caregiver.  MEDICATIONS You Stoneman resume your normal medications unless your caregiver tells you otherwise.  WHAT YOU CAN EXPECT TODAY You Basinski experience abdominal discomfort such as a feeling of fullness or "gas" pains.  FOLLOW-UP Your doctor will discuss the results of your test with you.  SEEK IMMEDIATE MEDICAL ATTENTION IF ANY OF THE FOLLOWING OCCUR: Excessive nausea (feeling sick to your stomach) and/or vomiting.  Severe abdominal pain and distention (swelling).  Trouble swallowing.  Temperature over 101 F (37.8 C).  Rectal bleeding or vomiting of blood.     Colonoscopy Discharge Instructions  Read the instructions outlined below and refer to this sheet in the next few weeks. These discharge instructions provide you with  general information on caring for yourself after you leave the hospital. Your doctor Battaglini also give you specific instructions. While your treatment has been planned according to the most current medical practices available, unavoidable complications occasionally occur.   ACTIVITY You Towner resume your regular activity, but move at a slower pace for the next 24 hours.  Take frequent rest periods for the next 24 hours.  Walking will help get rid of the air and reduce the bloated feeling in your belly (abdomen).  No driving for 24 hours (because of the medicine (anesthesia) used during the test).   Do not sign any important legal documents or operate any machinery for 24 hours (because of the anesthesia used during the test).  NUTRITION Drink plenty of fluids.  You Brickner resume your normal diet as instructed by your doctor.  Begin with a light meal and progress to your normal diet. Heavy or fried foods are harder to digest and Leitch make you feel sick to your stomach (nauseated).  Avoid alcoholic beverages for 24 hours or as instructed.  MEDICATIONS You Murcia resume your normal medications unless your doctor tells you otherwise.  WHAT YOU CAN EXPECT TODAY Some feelings of bloating in the abdomen.  Passage of more gas than usual.  Spotting of blood in your stool or on the toilet paper.  IF YOU HAD POLYPS REMOVED DURING THE COLONOSCOPY: No aspirin products for 7 days or as instructed.  No alcohol for 7 days or as instructed.  Eat a soft diet for the next 24 hours.  FINDING OUT THE RESULTS OF YOUR TEST Not all test results are  available during your visit. If your test results are not back during the visit, make an appointment with your caregiver to find out the results. Do not assume everything is normal if you have not heard from your caregiver or the medical facility. It is important for you to follow up on all of your test results.  SEEK IMMEDIATE MEDICAL ATTENTION IF: You have more than a spotting of  blood in your stool.  Your belly is swollen (abdominal distention).  You are nauseated or vomiting.  You have a temperature over 101.  You have abdominal pain or discomfort that is severe or gets worse throughout the day.   Your EGD revealed significant amount inflammation in your stomach.  I took biopsies of this to rule out infection with a bacteria called H. pylori.  You also had 2 small ulcers in your stomach as well.  Mild inflammation in your small bowel.  Also took samples of your esophagus as well.  I am going to increase your pantoprazole to twice daily.  Avoid NSAIDs.  Your colonoscopy revealed 2 polyp(s) which I removed successfully. Await pathology results, my office will contact you. I recommend repeating colonoscopy in 5 years for surveillance purposes.   Follow up in GI office in 8 weeks    I hope you have a great rest of your week!  Hennie Duos. Marletta Lor, D.O. Gastroenterology and Hepatology [facility]

## 2023-01-22 NOTE — Anesthesia Preprocedure Evaluation (Signed)
Anesthesia Evaluation  Patient identified by MRN, date of birth, ID band Patient awake    Reviewed: Allergy & Precautions, H&P , NPO status , Patient's Chart, lab work & pertinent test results, reviewed documented beta blocker date and time   Airway Mallampati: II  TM Distance: >3 FB Neck ROM: full    Dental no notable dental hx.    Pulmonary neg pulmonary ROS, former smoker   Pulmonary exam normal breath sounds clear to auscultation       Cardiovascular Exercise Tolerance: Good hypertension, negative cardio ROS  Rhythm:regular Rate:Normal     Neuro/Psych negative neurological ROS  negative psych ROS   GI/Hepatic negative GI ROS, Neg liver ROS,GERD  ,,  Endo/Other  negative endocrine ROS    Renal/GU negative Renal ROS  negative genitourinary   Musculoskeletal   Abdominal   Peds  Hematology negative hematology ROS (+)   Anesthesia Other Findings   Reproductive/Obstetrics negative OB ROS                             Anesthesia Physical Anesthesia Plan  ASA: 2  Anesthesia Plan: General   Post-op Pain Management:    Induction:   PONV Risk Score and Plan: Propofol infusion  Airway Management Planned:   Additional Equipment:   Intra-op Plan:   Post-operative Plan:   Informed Consent: I have reviewed the patients History and Physical, chart, labs and discussed the procedure including the risks, benefits and alternatives for the proposed anesthesia with the patient or authorized representative who has indicated his/her understanding and acceptance.     Dental Advisory Given  Plan Discussed with: CRNA  Anesthesia Plan Comments:        Anesthesia Quick Evaluation  

## 2023-01-22 NOTE — Op Note (Signed)
[facility] Patient Name: Victor [name] Procedure Date: 01/22/2023 12:48 PM MRN: [id number] Date of Birth: August 30, 1975 Attending MD: Hennie Duos. Marletta Lor , [place], [id number] CSN: [id number] Age: 48 Admit Type: Outpatient Procedure:                Upper GI endoscopy Indications:              Dysphagia, Heartburn, Nausea Providers:                Hennie Duos. Marletta Lor, DO, Buel Ream. Thomasena Edis RN, RN,                            Dyann Ruddle Referring MD:              Medicines:                See the Anesthesia note for documentation of the                            administered medications Complications:            No immediate complications. Estimated Blood Loss:     Estimated blood loss was minimal. Procedure:                Pre-Anesthesia Assessment:                           - The anesthesia plan was to use monitored                            anesthesia care (MAC).                           After obtaining informed consent, the endoscope was                            passed under direct vision. Throughout the                            procedure, the patient's blood pressure, pulse, and                            oxygen saturations were monitored continuously. The                            [id number]-H190 ([id number]) scope was introduced through the                            mouth, and advanced to the second part of duodenum.                            The upper GI endoscopy was accomplished without                            difficulty. The patient tolerated the procedure                            well. Scope In:  1:01:24 PM Scope Out: 1:05:50 PM Total Procedure Duration: 0 hours 4 minutes 26 seconds  Findings:      The Z-line was regular and was found 45 cm from the incisors.      Mucosal changes characterized by granularity were found in the middle       third of the esophagus. Biopsies were taken with a cold forceps for       histology.      Patchy moderate inflammation characterized by  erosions and erythema was       found in the gastric body and in the gastric antrum. Biopsies were taken       with a cold forceps for Helicobacter pylori testing.      Two non-bleeding cratered gastric ulcers with no stigmata of bleeding       were found in the gastric antrum. The largest lesion was 2 mm in largest       dimension.      Localized mild inflammation characterized by erythema was found in the       duodenal bulb. Impression:               - Z-line regular, 45 cm from the incisors.                           - Granular mucosa in the esophagus. Biopsied.                           - Gastritis. Biopsied.                           - Non-bleeding gastric ulcers with no stigmata of                            bleeding.                           - Duodenitis. Moderate Sedation:      Per Anesthesia Care Recommendation:           - Patient has a contact number available for                            emergencies. The signs and symptoms of potential                            delayed complications were discussed with the                            patient. Return to normal activities tomorrow.                            Written discharge instructions were provided to the                            patient.                           - Resume previous diet.                           -  Continue present medications.                           - Await pathology results.                           - Use a proton pump inhibitor PO BID.                           - No ibuprofen, naproxen, or other non-steroidal                            anti-inflammatory drugs. Procedure Code(s):        --- Professional ---                           [phone], Esophagogastroduodenoscopy, flexible,                            transoral; with biopsy, single or multiple Diagnosis Code(s):        --- Professional ---                           K22.89, Other specified disease of esophagus                            K29.70, Gastritis, unspecified, without bleeding                           K25.9, Gastric ulcer, unspecified as acute or                            chronic, without hemorrhage or perforation                           K29.80, Duodenitis without bleeding                           R13.10, Dysphagia, unspecified                           R12, Heartburn                           R11.0, Nausea CPT copyright 2022 American Medical Association. All rights reserved. The codes documented in this report are preliminary and upon coder review [place]  be revised to meet current compliance requirements. Hennie Duos. Marletta Lor, DO Hennie Duos. Marletta Lor, DO 01/22/2023 1:08:52 PM This report has been signed electronically. Number of Addenda: 0

## 2023-01-22 NOTE — Op Note (Signed)
[facility] Patient Name: Victor [name] Procedure Date: 01/22/2023 12:47 PM MRN: [id number] Date of Birth: 08/15/1976 Attending MD: Hennie Duos. Marletta Lor , [place], [id number] CSN: [id number] Age: 47 Admit Type: Outpatient Procedure:                Colonoscopy Indications:              Screening for colorectal malignant neoplasm Providers:                Hennie Duos. Marletta Lor, DO, Buel Ream. Thomasena Edis RN, RN,                            Dyann Ruddle Referring MD:              Medicines:                See the Anesthesia note for documentation of the                            administered medications Complications:            No immediate complications. Estimated Blood Loss:     Estimated blood loss was minimal. Procedure:                Pre-Anesthesia Assessment:                           - The anesthesia plan was to use monitored                            anesthesia care (MAC).                           After obtaining informed consent, the colonoscope                            was passed under direct vision. Throughout the                            procedure, the patient's blood pressure, pulse, and                            oxygen saturations were monitored continuously. The                            PCF-HQ190L ([id number] scope was introduced through                            the anus and advanced to the the cecum, identified                            by appendiceal orifice and ileocecal valve. The                            colonoscopy was performed without difficulty. The                            patient tolerated the procedure  well. The quality                            of the bowel preparation was evaluated using the                            BBPS [facility] Bowel Preparation Scale) with scores                            of: Right Colon = 2 (minor amount of residual                            staining, small fragments of stool and/or opaque                            liquid, but  mucosa seen well), Transverse Colon = 2                            (minor amount of residual staining, small fragments                            of stool and/or opaque liquid, but mucosa seen                            well) and Left Colon = 2 (minor amount of residual                            staining, small fragments of stool and/or opaque                            liquid, but mucosa seen well). The total BBPS score                            equals 6. Fair. Scope In: 1:09:38 PM Scope Out: 1:26:44 PM Scope Withdrawal Time: 0 hours 14 minutes 36 seconds  Total Procedure Duration: 0 hours 17 minutes 6 seconds  Findings:      Two sessile polyps were found in the descending colon. The polyps were 4       to 5 mm in size. These polyps were removed with a cold snare. Resection       and retrieval were complete.      A moderate amount of semi-liquid stool was found in the entire colon,       making visualization difficult. Lavage of the area was performed using       copious amounts of sterile water, resulting in clearance with fair       visualization.      The exam was otherwise without abnormality. Impression:               - Two 4 to 5 mm polyps in the descending colon,                            removed with a cold snare. Resected and retrieved.                           -  Stool in the entire examined colon. Moderate Sedation:      Per Anesthesia Care Recommendation:           - Patient has a contact number available for                            emergencies. The signs and symptoms of potential                            delayed complications were discussed with the                            patient. Return to normal activities tomorrow.                            Written discharge instructions were provided to the                            patient.                           - Resume previous diet.                           - Continue present medications.                            - Await pathology results.                           - Repeat colonoscopy in 5 years for surveillance                            and borderline prep.                           - Return to GI clinic in 3 months. Procedure Code(s):        --- Professional ---                           [phone], Colonoscopy, flexible; with removal of                            tumor(s), polyp(s), or other lesion(s) by snare                            technique Diagnosis Code(s):        --- Professional ---                           Z12.11, Encounter for screening for malignant                            neoplasm of colon                           D12.4, Benign neoplasm of descending colon CPT copyright  2022 American Medical Association. All rights reserved. The codes documented in this report are preliminary and upon coder review [place]  be revised to meet current compliance requirements. Hennie Duos. Marletta Lor, DO Hennie Duos. Marletta Lor, DO 01/22/2023 1:31:56 PM This report has been signed electronically. Number of Addenda: 0

## 2023-01-22 NOTE — Interval H&P Note (Signed)
History and Physical Interval Note:  01/22/2023 12:15 PM  Victor [name]  has presented today for surgery, with the diagnosis of screening colon cancer, dysphagia, gerd, nausea.  The various methods of treatment have been discussed with the patient and family. After consideration of risks, benefits and other options for treatment, the patient has consented to  Procedure(s) with comments: COLONOSCOPY WITH PROPOFOL (N/A) - 100pm, asa 2 ESOPHAGOGASTRODUODENOSCOPY (EGD) WITH PROPOFOL (N/A) BALLOON DILATION (N/A) as a surgical intervention.  The patient's history has been reviewed, patient examined, no change in status, stable for surgery.  I have reviewed the patient's chart and labs.  Questions were answered to the patient's satisfaction.     Lanelle Bal

## 2023-01-23 ENCOUNTER — Telehealth: Payer: Self-pay | Admitting: *Deleted

## 2023-01-23 LAB — SURGICAL PATHOLOGY

## 2023-01-23 NOTE — Telephone Encounter (Signed)
Pt's wife Annabelle Harman (on [place]) called and stated pt had procedures yesterday. She said he was fine yesterday but about 9:00 am this morning he is feeling nauseous and dry heaving. He did have a BM this morning and she said it was about 2" long. She says he is on a lot of medications. She wonders if it could be a combination of after effects of anesthesia and the medications making him sick. Please advise. Thank you

## 2023-01-23 NOTE — Telephone Encounter (Signed)
Pt's wife Annabelle Harman (on [place]) informed of providers message and recommendations. Verbalized understanding. She says pt is going to lay down for a while to see if symptoms subside and also that they have some Zofran to use at home if needed.

## 2023-01-27 NOTE — Anesthesia Postprocedure Evaluation (Signed)
Anesthesia Post Note  Patient: Victor [name]  Procedure(s) Performed: COLONOSCOPY WITH PROPOFOL ESOPHAGOGASTRODUODENOSCOPY (EGD) WITH PROPOFOL BIOPSY POLYPECTOMY  Patient location during evaluation: Phase II Anesthesia Type: General Level of consciousness: awake Pain management: pain level controlled Vital Signs Assessment: post-procedure vital signs reviewed and stable Respiratory status: spontaneous breathing and respiratory function stable Cardiovascular status: blood pressure returned to baseline and stable Postop Assessment: no headache and no apparent nausea or vomiting Anesthetic complications: no Comments: Late entry   No notable events documented.   Last Vitals:  Vitals:   01/22/23 1102 01/22/23 1330  BP: 117/82 112/68  Pulse: 75 75  Resp: 15 (!) 25  Temp: 36.7 C (!) 36.4 C  SpO2: 98% 99%    Last Pain:  Vitals:   01/22/23 1334  TempSrc:   PainSc: 0-No pain                 Windell Norfolk

## 2023-01-28 ENCOUNTER — Encounter (HOSPITAL_COMMUNITY): Payer: Self-pay | Admitting: Internal Medicine

## 2023-02-04 ENCOUNTER — Ambulatory Visit (INDEPENDENT_AMBULATORY_CARE_PROVIDER_SITE_OTHER): Payer: Medicare Other | Admitting: Family Medicine

## 2023-02-04 ENCOUNTER — Encounter: Payer: Self-pay | Admitting: Gastroenterology

## 2023-02-04 ENCOUNTER — Encounter: Payer: Self-pay | Admitting: Family Medicine

## 2023-02-04 VITALS — BP 128/87 | HR 69 | Temp 98.8°F | Ht 71.0 in | Wt 241.0 lb

## 2023-02-04 DIAGNOSIS — K5909 Other constipation: Secondary | ICD-10-CM

## 2023-02-04 DIAGNOSIS — F419 Anxiety disorder, unspecified: Secondary | ICD-10-CM | POA: Diagnosis not present

## 2023-02-04 DIAGNOSIS — F5104 Psychophysiologic insomnia: Secondary | ICD-10-CM | POA: Diagnosis not present

## 2023-02-04 MED ORDER — LINACLOTIDE 290 MCG PO CAPS
290.0000 ug | ORAL_CAPSULE | Freq: Every day | ORAL | 3 refills | Status: AC
Start: 2023-02-04 — End: ?

## 2023-02-04 MED ORDER — MIRTAZAPINE 7.5 MG PO TABS
7.5000 mg | ORAL_TABLET | Freq: Every day | ORAL | 3 refills | Status: AC
Start: 2023-02-04 — End: ?

## 2023-02-04 NOTE — Progress Notes (Signed)
Subjective: Victor [name] PCP: Raliegh Ip, DO Victor [name] Victor [name] is a 47 y.o. male presenting to clinic today for:  1. Insomnia associated with anxiety Patient failed trazodone. Started on Mirtazapine last visit.  He reports that he is doing quite a bit better with this medication.  He is satisfied with 7.5 mg dose and does not wish to increase at this time.  No reports of excessive daytime sedation.  Overall he feels better  2. Constipation associated with chronic opioid use Failed high dose Linzess.  Started on Trulance.  Reports that the Trulance really worked well but his pharmacy unfortunately cannot get it in stock so he would like to resume the Linzess again.  He had some pills left over after his colonoscopy and they seem to be doing okay.    ROS: Per HPI  No Known Allergies Past Medical History:  Diagnosis Date   GERD (gastroesophageal reflux disease)    History of stomach ulcers    Hypertension     Current Outpatient Medications:    amLODipine (NORVASC) 10 MG tablet, Take 1 tablet (10 mg total) by mouth daily., Disp: 90 tablet, Rfl: 3   aspirin EC 325 MG tablet, Take 325 mg by mouth daily., Disp: , Rfl:    Cholecalciferol (VITAMIN D) 125 MCG (5000 UT) CAPS, Take 5,000 Units by mouth daily., Disp: , Rfl:    docusate sodium (COLACE) 100 MG capsule, Take 100 mg by mouth daily., Disp: , Rfl:    methocarbamol (ROBAXIN) 500 MG tablet, Take 1 tablet (500 mg total) by mouth every 6 (six) hours as needed for muscle spasms., Disp: 30 tablet, Rfl: 0   mirtazapine (REMERON) 7.5 MG tablet, Take 1 tablet (7.5 mg total) by mouth at bedtime. For anxiety/ sleep, Disp: 90 tablet, Rfl: 0   naloxone (NARCAN) nasal spray 4 mg/0.1 mL, Place 0.4 mg into the nose once., Disp: , Rfl:    oxyCODONE-acetaminophen (PERCOCET) 7.5-325 MG tablet, Take 1 tablet by mouth every 8 (eight) hours as needed for severe pain., Disp: , Rfl:    pantoprazole (PROTONIX) 40 MG tablet, Take 1 tablet (40 mg  total) by mouth 2 (two) times daily., Disp: 60 tablet, Rfl: 11   Plecanatide (TRULANCE) 3 MG TABS, Take 1 tablet (3 mg total) by mouth daily., Disp: 90 tablet, Rfl: 3   pregabalin (LYRICA) 100 MG capsule, Take 100 mg by mouth 3 (three) times daily as needed (pain)., Disp: , Rfl:    rosuvastatin (CRESTOR) 10 MG tablet, Take 1 tablet (10 mg total) by mouth daily., Disp: 90 tablet, Rfl: 0 Social History   Socioeconomic History   Marital status: Married    Spouse name: Not on file   Number of children: 3   Years of education: Not on file   Highest education level: 11th grade  Occupational History   Not on file  Tobacco Use   Smoking status: Former    Packs/day: 0.50    Years: 15.00    Additional pack years: 0.00    Total pack years: 7.50    Types: Cigarettes    Start date: 07/07/1991    Quit date: 04/09/2018    Years since quitting: 4.8   Smokeless tobacco: Former    Types: Chew    Quit date: 06/09/2018  Vaping Use   Vaping Use: Never used  Substance and Sexual Activity   Alcohol use: No   Drug use: No   Sexual activity: Yes  Other Topics Concern   Not on  file  Social History Narrative   Not on file   Social Determinants of Health   Financial Resource Strain: Low Risk  (12/20/2022)   Overall Financial Resource Strain (CARDIA)    Difficulty of Paying Living Expenses: Not very hard  Food Insecurity: Food Insecurity Present (12/20/2022)   Hunger Vital Sign    Worried About Running Out of Food in the Last Year: Sometimes true    Ran Out of Food in the Last Year: Sometimes true  Transportation Needs: No Transportation Needs (12/20/2022)   PRAPARE - Administrator, Civil Service (Medical): No    Lack of Transportation (Non-Medical): No  Physical Activity: Unknown (12/20/2022)   Exercise Vital Sign    Days of Exercise per Week: 0 days    Minutes of Exercise per Session: Not on file  Stress: Stress Concern Present (12/20/2022)   [employer] of Occupational  Health - Occupational Stress Questionnaire    Feeling of Stress : To some extent  Social Connections: Moderately Integrated (12/20/2022)   Social Connection and Isolation Panel [NHANES]    Frequency of Communication with Friends and Family: More than three times a week    Frequency of Social Gatherings with Friends and Family: Twice a week    Attends Religious Services: More than 4 times per year    Active Member of [employer] or Organizations: No    Attends Engineer, structural: Not on file    Marital Status: Married  Catering manager Violence: Not on file   Family History  Problem Relation Age of Onset   Diabetes Mother    Heart failure Mother    Cancer Mother    Pancreatic cancer Mother    Cancer Father    Throat cancer Father    Healthy Sister    Head & neck cancer Brother    Bone cancer Maternal Grandmother    Diabetes Maternal Grandmother    CAD Maternal Grandfather    Brain cancer Maternal Grandfather    Colon cancer Paternal Grandfather     Objective: Office vital signs reviewed. BP 128/87   Pulse 69   Temp 98.8 F (37.1 C)   Ht 5\' 11"  (1.803 m)   Wt 241 lb (109.3 kg)   SpO2 96%   BMI 33.61 kg/m   Physical Examination:  General: Awake, alert, well nourished, No acute distress HEENT: sclera white, MMM Cardio: regular rate and rhythm, S1S2 heard, no murmurs appreciated Pulm: clear to auscultation bilaterally, no wheezes, rhonchi or rales; normal work of breathing on room air Psych: Mood stable, speech normal, affect appropriate.  Pleasant, interactive     02/04/2023    9:59 AM 12/24/2022   11:49 AM 07/31/2022    9:40 AM  Depression screen PHQ 2/9  Decreased Interest 1 2 2   Down, Depressed, Hopeless 1 2 2   PHQ - 2 Score 2 4 4   Altered sleeping 2 2 2   Tired, decreased energy 1 2 3   Change in appetite 1 1 1   Feeling bad or failure about yourself  1 2 1   Trouble concentrating 1 2 1   Moving slowly or fidgety/restless 0 2 1  Suicidal thoughts 0 0 0   PHQ-9 Score 8 15 13   Difficult doing work/chores Somewhat difficult Very difficult Somewhat difficult      02/04/2023    9:59 AM 12/24/2022   11:31 AM 07/31/2022    9:41 AM 06/04/2022    9:17 AM  GAD 7 : Generalized Anxiety Score  Nervous, Anxious,  on Edge 1 3 2 1   Control/stop worrying 1 2 3 1   Worry too much - different things 1 3 2 1   Trouble relaxing 2 3 2 3   Restless 2 2 2 1   Easily annoyed or irritable 1 2 2 1   Afraid - awful might happen 0 0 1 0  Total GAD 7 Score 8 15 14 8   Anxiety Difficulty Somewhat difficult Very difficult Somewhat difficult Very difficult      Assessment/ Plan: 47 y.o. male   Anxiety - Plan: mirtazapine (REMERON) 7.5 MG tablet  Psychophysiological insomnia - Plan: mirtazapine (REMERON) 7.5 MG tablet  Chronic constipation - Plan: linaclotide (LINZESS) 290 MCG CAPS capsule  Anxiety doing much better as well as his insomnia with mirtazapine 7.5 mg.  He is satisfied with the dose so we will keep him on it.  Unfortunately his pharmacy is not able to get the Trulance which was working for him so he is going to go back to [facility] as he does not want to switch pharmacies.  This has been renewed for him.  He can follow-up as needed on this issue  No orders of the defined types were placed in this encounter.  No orders of the defined types were placed in this encounter.    Raliegh Ip, DO [facility] [phone]

## 2023-02-27 ENCOUNTER — Encounter: Payer: Self-pay | Admitting: Family Medicine

## 2023-02-28 ENCOUNTER — Encounter: Payer: Self-pay | Admitting: Family

## 2023-02-28 ENCOUNTER — Telehealth (INDEPENDENT_AMBULATORY_CARE_PROVIDER_SITE_OTHER): Payer: Medicare Other | Admitting: Family

## 2023-02-28 DIAGNOSIS — K047 Periapical abscess without sinus: Secondary | ICD-10-CM | POA: Diagnosis not present

## 2023-02-28 MED ORDER — AMOXICILLIN-POT CLAVULANATE 875-125 MG PO TABS
1.00 | ORAL_TABLET | Freq: Two times a day (BID) | ORAL | 0 refills | Status: AC
Start: 2023-02-28 — End: ?

## 2023-02-28 NOTE — Progress Notes (Signed)
Virtual Visit Consent   Victor [name], you are scheduled for a virtual visit with a  provider today. Just as with appointments in the office, your consent must be obtained to participate. Your consent will be active for this visit and any virtual visit you [place] have with one of our providers in the next 365 days. If you have a MyChart account, a copy of this consent can be sent to you electronically.  As this is a virtual visit, video technology does not allow for your provider to perform a traditional examination. This [place] limit your provider's ability to fully assess your condition. If your provider identifies any concerns that need to be evaluated in person or the need to arrange testing (such as labs, EKG, etc.), we will make arrangements to do so. Although advances in technology are sophisticated, we cannot ensure that it will always work on either your end or our end. If the connection with a video visit is poor, the visit [place] have to be switched to a telephone visit. With either a video or telephone visit, we are not always able to ensure that we have a secure connection.  By engaging in this virtual visit, you consent to the provision of healthcare and authorize for your insurance to be billed (if applicable) for the services provided during this visit. Depending on your insurance coverage, you [place] receive a charge related to this service.  I need to obtain your verbal consent now. Are you willing to proceed with your visit today? Victor [name] has provided verbal consent on 02/28/2023 for a virtual visit (video or telephone). Jannifer Rodney, FNP  Date: 02/28/2023 3:58 PM  Virtual Visit via Video Note   I, Jannifer Rodney, connected with  Victor [name]  ([id number], [birth year]) on 02/28/23 at  4:30 PM EDT by a video-enabled telemedicine application and verified that I am speaking with the correct person using two identifiers.  Location: Patient: Virtual Visit Location Patient:  Home Provider: Virtual Visit Location Provider: Office/Clinic   I discussed the limitations of evaluation and management by telemedicine and the availability of in person appointments. The patient expressed understanding and agreed to proceed.    History of Present Illness: Victor [name] is a 47 y.o. who identifies as a male who was assigned male at birth, and is being seen today for dental infection in left lower gum that he noticed a few days ago. Reports it has worsen. He can not get into his dentist until August. Denies any fever and able to eat and drink.   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   S/P hardware removal right knee 09/21/19 10/04/2019   Essential hypertension 04/07/2019   Family history of early CAD 04/07/2019   History of chest pain 03/10/2019   Hyperlipidemia 03/10/2019   Tobacco abuse 03/10/2019   S/P right knee arthroscopy 02/05/18 06/23/2017   S/P ACL repair right 05/19/18    Chondromalacia of lateral femoral condyle, right    Chondromalacia, patella,  right     Allergies: No Known Allergies Medications:  Current Outpatient Medications:    amoxicillin-clavulanate (AUGMENTIN) 875-125 MG tablet, Take 1 tablet by mouth 2 (two) times daily., Disp: 20 tablet, Rfl: 0   amLODipine (NORVASC) 10 MG tablet, Take 1 tablet (10 mg total) by mouth daily., Disp: 90 tablet, Rfl: 3   aspirin EC 325 MG tablet, Take 325 mg by mouth daily., Disp: , Rfl:    Cholecalciferol (VITAMIN D) 125 MCG (5000 UT) CAPS, Take 5,000 Units by mouth  daily., Disp: , Rfl:    docusate sodium (COLACE) 100 MG capsule, Take 100 mg by mouth daily., Disp: , Rfl:    linaclotide (LINZESS) 290 MCG CAPS capsule, Take 1 capsule (290 mcg total) by mouth daily before breakfast., Disp: 90 capsule, Rfl: 3   methocarbamol (ROBAXIN) 500 MG tablet, Take 1 tablet (500 mg total) by mouth every 6 (six) hours as needed for muscle spasms., Disp: 30 tablet, Rfl: 0   mirtazapine (REMERON) 7.5 MG tablet, Take 1 tablet (7.5 mg total) by mouth at bedtime. For anxiety/ sleep, Disp: 90 tablet, Rfl: 3   naloxone (NARCAN) nasal spray 4 mg/0.1 mL, Place 0.4 mg into the nose once., Disp: , Rfl:    oxyCODONE-acetaminophen (PERCOCET) 7.5-325 MG tablet, Take 1 tablet by mouth every 8 (eight) hours as needed for severe pain., Disp: , Rfl:    pantoprazole (PROTONIX) 40 MG tablet, Take 1 tablet (40 mg total) by mouth 2 (two) times daily., Disp: 60 tablet, Rfl: 11   pregabalin (LYRICA) 100 MG capsule, Take 100 mg by mouth 3 (three) times daily as needed (pain)., Disp: , Rfl:    rosuvastatin (CRESTOR) 10 MG tablet, Take 1 tablet (10 mg total) by mouth daily., Disp: 90 tablet, Rfl: 0  Observations/Objective: Patient is well-developed, well-nourished in no acute distress.  Resting comfortably  at home.  Head is normocephalic, atraumatic.  No labored breathing.  Speech is clear and coherent with logical content.  Patient is alert and oriented at baseline.    Assessment and Plan: 1. Dental infection - amoxicillin-clavulanate (AUGMENTIN) 875-125 MG tablet; Take 1 tablet by mouth 2 (two) times daily.  Dispense: 20 tablet; Refill: 0  Start Augmentin  Motrin  Rinse mouth after meals Keep dentist appointment  Follow up if symptoms worsen or do not improve   Follow Up Instructions: I discussed the assessment and treatment plan with the patient. The patient was provided an opportunity to ask questions and all were answered. The patient agreed with the plan and demonstrated an  understanding of the instructions.  A copy of instructions were sent to the patient via MyChart unless otherwise noted below.     The patient was advised to call back or seek an in-person evaluation if the symptoms worsen or if the condition fails to improve as anticipated.  Time:  I spent 6 minutes with the patient via telehealth technology discussing the above problems/concerns.    Jannifer Rodney, FNP

## 2023-03-12 ENCOUNTER — Ambulatory Visit: Payer: Medicare Other

## 2023-03-21 ENCOUNTER — Ambulatory Visit: Payer: Medicare Other

## 2023-04-22 ENCOUNTER — Ambulatory Visit: Payer: Medicare Other
